# Patient Record
Sex: Female | Born: 1955 | Race: Black or African American | Hispanic: No | Smoking: Former smoker
Health system: Southern US, Community
[De-identification: ages and names within clinical notes are randomized; demographics above are authoritative.]

## PROBLEM LIST (undated history)

## (undated) DIAGNOSIS — N179 Acute kidney failure, unspecified: Secondary | ICD-10-CM

## (undated) DIAGNOSIS — I10 Essential (primary) hypertension: Secondary | ICD-10-CM

## (undated) DIAGNOSIS — R21 Rash and other nonspecific skin eruption: Secondary | ICD-10-CM

## (undated) DIAGNOSIS — M1990 Unspecified osteoarthritis, unspecified site: Secondary | ICD-10-CM

## (undated) DIAGNOSIS — N281 Cyst of kidney, acquired: Secondary | ICD-10-CM

## (undated) DIAGNOSIS — M21619 Bunion of unspecified foot: Secondary | ICD-10-CM

## (undated) DIAGNOSIS — I639 Cerebral infarction, unspecified: Secondary | ICD-10-CM

## (undated) DIAGNOSIS — K5790 Diverticulosis of intestine, part unspecified, without perforation or abscess without bleeding: Secondary | ICD-10-CM

## (undated) DIAGNOSIS — L819 Disorder of pigmentation, unspecified: Secondary | ICD-10-CM

## (undated) DIAGNOSIS — K219 Gastro-esophageal reflux disease without esophagitis: Secondary | ICD-10-CM

## (undated) DIAGNOSIS — M654 Radial styloid tenosynovitis [de Quervain]: Secondary | ICD-10-CM

## (undated) DIAGNOSIS — T849XXA Unspecified complication of internal orthopedic prosthetic device, implant and graft, initial encounter: Secondary | ICD-10-CM

## (undated) DIAGNOSIS — I491 Atrial premature depolarization: Secondary | ICD-10-CM

## (undated) DIAGNOSIS — M791 Myalgia, unspecified site: Secondary | ICD-10-CM

## (undated) DIAGNOSIS — R252 Cramp and spasm: Secondary | ICD-10-CM

## (undated) DIAGNOSIS — M7550 Bursitis of unspecified shoulder: Secondary | ICD-10-CM

## (undated) DIAGNOSIS — K5900 Constipation, unspecified: Secondary | ICD-10-CM

## (undated) DIAGNOSIS — D1720 Benign lipomatous neoplasm of skin and subcutaneous tissue of unspecified limb: Secondary | ICD-10-CM

## (undated) DIAGNOSIS — K5731 Diverticulosis of large intestine without perforation or abscess with bleeding: Secondary | ICD-10-CM

## (undated) DIAGNOSIS — L989 Disorder of the skin and subcutaneous tissue, unspecified: Secondary | ICD-10-CM

## (undated) DIAGNOSIS — K838 Other specified diseases of biliary tract: Secondary | ICD-10-CM

## (undated) DIAGNOSIS — Z8673 Personal history of transient ischemic attack (TIA), and cerebral infarction without residual deficits: Secondary | ICD-10-CM

## (undated) DIAGNOSIS — T7840XA Allergy, unspecified, initial encounter: Secondary | ICD-10-CM

## (undated) DIAGNOSIS — N823 Fistula of vagina to large intestine: Secondary | ICD-10-CM

## (undated) DIAGNOSIS — M542 Cervicalgia: Secondary | ICD-10-CM

## (undated) DIAGNOSIS — I809 Phlebitis and thrombophlebitis of unspecified site: Secondary | ICD-10-CM

## (undated) DIAGNOSIS — K567 Ileus, unspecified: Secondary | ICD-10-CM

## (undated) DIAGNOSIS — Z972 Presence of dental prosthetic device (complete) (partial): Secondary | ICD-10-CM

## (undated) DIAGNOSIS — E7800 Pure hypercholesterolemia, unspecified: Secondary | ICD-10-CM

## (undated) DIAGNOSIS — M75120 Complete rotator cuff tear or rupture of unspecified shoulder, not specified as traumatic: Secondary | ICD-10-CM

## (undated) DIAGNOSIS — K644 Residual hemorrhoidal skin tags: Secondary | ICD-10-CM

## (undated) DIAGNOSIS — M549 Dorsalgia, unspecified: Secondary | ICD-10-CM

## (undated) DIAGNOSIS — Z8719 Personal history of other diseases of the digestive system: Secondary | ICD-10-CM

## (undated) DIAGNOSIS — S81809A Unspecified open wound, unspecified lower leg, initial encounter: Secondary | ICD-10-CM

## (undated) DIAGNOSIS — M7540 Impingement syndrome of unspecified shoulder: Secondary | ICD-10-CM

## (undated) DIAGNOSIS — K289 Gastrojejunal ulcer, unspecified as acute or chronic, without hemorrhage or perforation: Secondary | ICD-10-CM

## (undated) DIAGNOSIS — G459 Transient cerebral ischemic attack, unspecified: Secondary | ICD-10-CM

## (undated) DIAGNOSIS — IMO0002 Reserved for concepts with insufficient information to code with codable children: Secondary | ICD-10-CM

## (undated) DIAGNOSIS — E1169 Type 2 diabetes mellitus with other specified complication: Secondary | ICD-10-CM

## (undated) DIAGNOSIS — M545 Low back pain: Secondary | ICD-10-CM

## (undated) DIAGNOSIS — M19011 Primary osteoarthritis, right shoulder: Secondary | ICD-10-CM

## (undated) DIAGNOSIS — R079 Chest pain, unspecified: Secondary | ICD-10-CM

## (undated) DIAGNOSIS — R202 Paresthesia of skin: Secondary | ICD-10-CM

## (undated) DIAGNOSIS — S83249A Other tear of medial meniscus, current injury, unspecified knee, initial encounter: Secondary | ICD-10-CM

## (undated) DIAGNOSIS — D649 Anemia, unspecified: Secondary | ICD-10-CM

## (undated) DIAGNOSIS — I83023 Varicose veins of left lower extremity with ulcer of ankle: Secondary | ICD-10-CM

## (undated) DIAGNOSIS — K56609 Unspecified intestinal obstruction, unspecified as to partial versus complete obstruction: Secondary | ICD-10-CM

## (undated) DIAGNOSIS — R1110 Vomiting, unspecified: Secondary | ICD-10-CM

## (undated) DIAGNOSIS — M19019 Primary osteoarthritis, unspecified shoulder: Secondary | ICD-10-CM

## (undated) DIAGNOSIS — Z5189 Encounter for other specified aftercare: Secondary | ICD-10-CM

## (undated) DIAGNOSIS — L03116 Cellulitis of left lower limb: Secondary | ICD-10-CM

## (undated) DIAGNOSIS — N939 Abnormal uterine and vaginal bleeding, unspecified: Secondary | ICD-10-CM

## (undated) DIAGNOSIS — K921 Melena: Secondary | ICD-10-CM

## (undated) DIAGNOSIS — K922 Gastrointestinal hemorrhage, unspecified: Secondary | ICD-10-CM

## (undated) DIAGNOSIS — K284 Chronic or unspecified gastrojejunal ulcer with hemorrhage: Secondary | ICD-10-CM

## (undated) DIAGNOSIS — R0683 Snoring: Secondary | ICD-10-CM

## (undated) DIAGNOSIS — K5730 Diverticulosis of large intestine without perforation or abscess without bleeding: Secondary | ICD-10-CM

## (undated) HISTORY — DX: Constipation, unspecified: K59.00

## (undated) HISTORY — DX: Other tear of medial meniscus, current injury, unspecified knee, initial encounter: S83.249A

## (undated) HISTORY — DX: Essential (primary) hypertension: I10

## (undated) HISTORY — PX: ABDOMINAL HYSTERECTOMY: SHX81

## (undated) HISTORY — DX: Bunion of unspecified foot: M21.619

## (undated) HISTORY — DX: Diverticulosis of large intestine without perforation or abscess with bleeding: K57.31

## (undated) HISTORY — DX: Allergy, unspecified, initial encounter: T78.40XA

## (undated) HISTORY — DX: Type 2 diabetes mellitus with other specified complication: E11.69

## (undated) HISTORY — DX: Melena: K92.1

## (undated) HISTORY — DX: Chronic or unspecified gastrojejunal ulcer with hemorrhage: K28.4

## (undated) HISTORY — DX: Cramp and spasm: R25.2

## (undated) HISTORY — DX: Reserved for concepts with insufficient information to code with codable children: IMO0002

## (undated) HISTORY — DX: Cellulitis of left lower limb: L03.116

## (undated) HISTORY — DX: Anemia, unspecified: D64.9

## (undated) HISTORY — DX: Diverticulosis of large intestine without perforation or abscess without bleeding: K57.30

## (undated) HISTORY — DX: Gastrointestinal hemorrhage, unspecified: K92.2

## (undated) HISTORY — DX: Myalgia, unspecified site: M79.10

## (undated) HISTORY — DX: Cerebral infarction, unspecified: I63.9

## (undated) HISTORY — DX: Vomiting, unspecified: R11.10

## (undated) HISTORY — DX: Dorsalgia, unspecified: M54.9

## (undated) HISTORY — DX: Radial styloid tenosynovitis (de quervain): M65.4

## (undated) HISTORY — DX: Acute kidney failure, unspecified: N17.9

## (undated) HISTORY — PX: TOTAL KNEE ARTHROPLASTY: SHX125

## (undated) HISTORY — DX: Chest pain, unspecified: R07.9

## (undated) HISTORY — PX: COLONOSCOPY: SHX174

---
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
## 1993-11-01 HISTORY — PX: METATARSAL OSTEOTOMY: SHX1641

## 1995-11-02 DIAGNOSIS — S83249A Other tear of medial meniscus, current injury, unspecified knee, initial encounter: Secondary | ICD-10-CM

## 1995-11-02 HISTORY — DX: Other tear of medial meniscus, current injury, unspecified knee, initial encounter: S83.249A

## 1998-10-10 ENCOUNTER — Encounter: Admission: RE | Admit: 1998-10-10 | Discharge: 1999-01-08 | Payer: Self-pay | Admitting: *Deleted

## 1998-12-23 ENCOUNTER — Encounter: Payer: Self-pay | Admitting: Orthopedic Surgery

## 1998-12-29 ENCOUNTER — Encounter: Payer: Self-pay | Admitting: Orthopedic Surgery

## 1998-12-29 ENCOUNTER — Inpatient Hospital Stay (HOSPITAL_COMMUNITY): Admission: RE | Admit: 1998-12-29 | Discharge: 1999-01-01 | Payer: Self-pay | Admitting: Orthopedic Surgery

## 1999-01-01 ENCOUNTER — Inpatient Hospital Stay (HOSPITAL_COMMUNITY)
Admission: RE | Admit: 1999-01-01 | Discharge: 1999-01-07 | Payer: Self-pay | Admitting: Physical Medicine and Rehabilitation

## 1999-02-27 ENCOUNTER — Ambulatory Visit (HOSPITAL_COMMUNITY): Admission: RE | Admit: 1999-02-27 | Discharge: 1999-02-27 | Payer: Self-pay | Admitting: Obstetrics and Gynecology

## 2000-05-01 ENCOUNTER — Inpatient Hospital Stay (HOSPITAL_COMMUNITY): Admission: EM | Admit: 2000-05-01 | Discharge: 2000-05-04 | Payer: Self-pay | Admitting: *Deleted

## 2000-05-04 ENCOUNTER — Inpatient Hospital Stay (HOSPITAL_COMMUNITY): Admission: EM | Admit: 2000-05-04 | Discharge: 2000-05-10 | Payer: Self-pay | Admitting: Emergency Medicine

## 2000-05-05 ENCOUNTER — Encounter: Payer: Self-pay | Admitting: Gastroenterology

## 2000-05-08 ENCOUNTER — Encounter: Payer: Self-pay | Admitting: Gastroenterology

## 2000-05-09 ENCOUNTER — Encounter: Payer: Self-pay | Admitting: Gastroenterology

## 2001-04-08 ENCOUNTER — Emergency Department (HOSPITAL_COMMUNITY): Admission: EM | Admit: 2001-04-08 | Discharge: 2001-04-08 | Payer: Self-pay | Admitting: Emergency Medicine

## 2001-04-25 ENCOUNTER — Emergency Department (HOSPITAL_COMMUNITY): Admission: EM | Admit: 2001-04-25 | Discharge: 2001-04-25 | Payer: Self-pay | Admitting: Emergency Medicine

## 2001-05-10 ENCOUNTER — Encounter: Payer: Self-pay | Admitting: Emergency Medicine

## 2001-05-10 ENCOUNTER — Emergency Department (HOSPITAL_COMMUNITY): Admission: EM | Admit: 2001-05-10 | Discharge: 2001-05-10 | Payer: Self-pay | Admitting: Emergency Medicine

## 2001-07-12 ENCOUNTER — Encounter: Payer: Self-pay | Admitting: Family Medicine

## 2001-07-12 ENCOUNTER — Encounter: Admission: RE | Admit: 2001-07-12 | Discharge: 2001-07-12 | Payer: Self-pay | Admitting: Family Medicine

## 2002-04-17 ENCOUNTER — Inpatient Hospital Stay (HOSPITAL_COMMUNITY): Admission: EM | Admit: 2002-04-17 | Discharge: 2002-04-19 | Payer: Self-pay | Admitting: Emergency Medicine

## 2002-12-06 ENCOUNTER — Ambulatory Visit (HOSPITAL_COMMUNITY): Admission: RE | Admit: 2002-12-06 | Discharge: 2002-12-06 | Payer: Self-pay | Admitting: Family Medicine

## 2004-01-31 ENCOUNTER — Encounter (INDEPENDENT_AMBULATORY_CARE_PROVIDER_SITE_OTHER): Payer: Self-pay | Admitting: *Deleted

## 2004-01-31 LAB — CONVERTED CEMR LAB

## 2004-07-09 ENCOUNTER — Emergency Department (HOSPITAL_COMMUNITY): Admission: EM | Admit: 2004-07-09 | Discharge: 2004-07-09 | Payer: Self-pay | Admitting: *Deleted

## 2004-07-17 ENCOUNTER — Ambulatory Visit: Payer: Self-pay

## 2004-07-22 ENCOUNTER — Encounter: Payer: Self-pay | Admitting: Cardiology

## 2004-07-22 ENCOUNTER — Ambulatory Visit: Admission: RE | Admit: 2004-07-22 | Discharge: 2004-07-22 | Payer: Self-pay | Admitting: Family Medicine

## 2004-07-27 ENCOUNTER — Ambulatory Visit: Payer: Self-pay | Admitting: Family Medicine

## 2004-07-30 ENCOUNTER — Encounter: Admission: RE | Admit: 2004-07-30 | Discharge: 2004-07-30 | Payer: Self-pay | Admitting: Sports Medicine

## 2004-08-05 ENCOUNTER — Encounter: Admission: RE | Admit: 2004-08-05 | Discharge: 2004-08-05 | Payer: Self-pay | Admitting: Sports Medicine

## 2004-09-02 ENCOUNTER — Ambulatory Visit: Payer: Self-pay | Admitting: Family Medicine

## 2004-09-14 ENCOUNTER — Ambulatory Visit: Payer: Self-pay | Admitting: Family Medicine

## 2004-10-01 ENCOUNTER — Ambulatory Visit: Payer: Self-pay | Admitting: Family Medicine

## 2004-10-14 ENCOUNTER — Ambulatory Visit: Payer: Self-pay | Admitting: Family Medicine

## 2004-11-01 DIAGNOSIS — I639 Cerebral infarction, unspecified: Secondary | ICD-10-CM

## 2004-11-01 HISTORY — DX: Cerebral infarction, unspecified: I63.9

## 2004-11-30 ENCOUNTER — Ambulatory Visit: Payer: Self-pay | Admitting: Sports Medicine

## 2004-12-30 ENCOUNTER — Emergency Department (HOSPITAL_COMMUNITY): Admission: EM | Admit: 2004-12-30 | Discharge: 2004-12-30 | Payer: Self-pay | Admitting: Emergency Medicine

## 2004-12-31 ENCOUNTER — Ambulatory Visit: Payer: Self-pay | Admitting: Family Medicine

## 2005-01-07 ENCOUNTER — Ambulatory Visit: Payer: Self-pay | Admitting: Family Medicine

## 2005-01-15 ENCOUNTER — Ambulatory Visit: Payer: Self-pay | Admitting: Family Medicine

## 2005-01-18 ENCOUNTER — Encounter: Admission: RE | Admit: 2005-01-18 | Discharge: 2005-01-18 | Payer: Self-pay | Admitting: Sports Medicine

## 2005-03-04 ENCOUNTER — Ambulatory Visit: Payer: Self-pay | Admitting: Family Medicine

## 2005-03-05 ENCOUNTER — Ambulatory Visit: Payer: Self-pay | Admitting: Family Medicine

## 2005-03-10 ENCOUNTER — Ambulatory Visit: Payer: Self-pay | Admitting: Family Medicine

## 2005-03-19 ENCOUNTER — Ambulatory Visit: Payer: Self-pay | Admitting: Family Medicine

## 2005-04-01 ENCOUNTER — Ambulatory Visit: Payer: Self-pay | Admitting: Family Medicine

## 2005-04-09 ENCOUNTER — Ambulatory Visit: Payer: Self-pay | Admitting: Family Medicine

## 2005-04-12 ENCOUNTER — Ambulatory Visit: Payer: Self-pay | Admitting: Family Medicine

## 2005-06-03 ENCOUNTER — Ambulatory Visit: Payer: Self-pay | Admitting: Family Medicine

## 2005-06-21 ENCOUNTER — Ambulatory Visit: Payer: Self-pay | Admitting: Sports Medicine

## 2005-08-04 ENCOUNTER — Emergency Department (HOSPITAL_COMMUNITY): Admission: EM | Admit: 2005-08-04 | Discharge: 2005-08-04 | Payer: Self-pay | Admitting: Emergency Medicine

## 2005-08-09 ENCOUNTER — Emergency Department (HOSPITAL_COMMUNITY): Admission: EM | Admit: 2005-08-09 | Discharge: 2005-08-09 | Payer: Self-pay | Admitting: Emergency Medicine

## 2005-08-11 ENCOUNTER — Ambulatory Visit (HOSPITAL_COMMUNITY): Admission: RE | Admit: 2005-08-11 | Discharge: 2005-08-11 | Payer: Self-pay | Admitting: Family Medicine

## 2005-08-11 ENCOUNTER — Encounter: Payer: Self-pay | Admitting: Cardiology

## 2005-08-11 ENCOUNTER — Ambulatory Visit: Payer: Self-pay | Admitting: Cardiology

## 2005-08-18 ENCOUNTER — Ambulatory Visit: Payer: Self-pay | Admitting: Family Medicine

## 2005-08-31 ENCOUNTER — Ambulatory Visit: Payer: Self-pay | Admitting: Family Medicine

## 2005-08-31 ENCOUNTER — Observation Stay (HOSPITAL_COMMUNITY): Admission: AD | Admit: 2005-08-31 | Discharge: 2005-09-01 | Payer: Self-pay | Admitting: Family Medicine

## 2005-09-03 ENCOUNTER — Ambulatory Visit: Payer: Self-pay | Admitting: Family Medicine

## 2005-09-03 ENCOUNTER — Ambulatory Visit (HOSPITAL_COMMUNITY): Admission: RE | Admit: 2005-09-03 | Discharge: 2005-09-03 | Payer: Self-pay | Admitting: Family Medicine

## 2005-09-06 ENCOUNTER — Ambulatory Visit: Payer: Self-pay | Admitting: Family Medicine

## 2005-09-10 ENCOUNTER — Ambulatory Visit: Payer: Self-pay | Admitting: Family Medicine

## 2005-09-18 ENCOUNTER — Emergency Department (HOSPITAL_COMMUNITY): Admission: EM | Admit: 2005-09-18 | Discharge: 2005-09-19 | Payer: Self-pay | Admitting: Emergency Medicine

## 2005-09-20 ENCOUNTER — Ambulatory Visit: Payer: Self-pay | Admitting: Sports Medicine

## 2005-10-18 ENCOUNTER — Ambulatory Visit: Payer: Self-pay | Admitting: Family Medicine

## 2005-11-22 ENCOUNTER — Ambulatory Visit: Payer: Self-pay | Admitting: Sports Medicine

## 2005-12-07 ENCOUNTER — Ambulatory Visit: Payer: Self-pay | Admitting: Sports Medicine

## 2006-01-10 ENCOUNTER — Ambulatory Visit: Payer: Self-pay | Admitting: Family Medicine

## 2006-02-28 ENCOUNTER — Ambulatory Visit: Payer: Self-pay | Admitting: Sports Medicine

## 2006-03-02 ENCOUNTER — Ambulatory Visit: Payer: Self-pay | Admitting: Family Medicine

## 2006-03-02 ENCOUNTER — Inpatient Hospital Stay (HOSPITAL_COMMUNITY): Admission: AD | Admit: 2006-03-02 | Discharge: 2006-03-05 | Payer: Self-pay | Admitting: Family Medicine

## 2006-03-07 ENCOUNTER — Ambulatory Visit: Payer: Self-pay | Admitting: Gastroenterology

## 2006-03-07 ENCOUNTER — Ambulatory Visit: Payer: Self-pay | Admitting: Sports Medicine

## 2006-03-16 ENCOUNTER — Ambulatory Visit: Payer: Self-pay | Admitting: Family Medicine

## 2006-05-26 ENCOUNTER — Ambulatory Visit: Payer: Self-pay | Admitting: Family Medicine

## 2006-06-01 ENCOUNTER — Encounter: Admission: RE | Admit: 2006-06-01 | Discharge: 2006-06-01 | Payer: Self-pay | Admitting: Sports Medicine

## 2006-08-15 ENCOUNTER — Ambulatory Visit: Payer: Self-pay | Admitting: Sports Medicine

## 2006-08-23 ENCOUNTER — Ambulatory Visit (HOSPITAL_COMMUNITY): Admission: RE | Admit: 2006-08-23 | Discharge: 2006-08-23 | Payer: Self-pay | Admitting: Sports Medicine

## 2006-08-23 ENCOUNTER — Encounter (INDEPENDENT_AMBULATORY_CARE_PROVIDER_SITE_OTHER): Payer: Self-pay | Admitting: Cardiology

## 2006-08-28 ENCOUNTER — Emergency Department (HOSPITAL_COMMUNITY): Admission: EM | Admit: 2006-08-28 | Discharge: 2006-08-28 | Payer: Self-pay | Admitting: Emergency Medicine

## 2006-08-31 ENCOUNTER — Ambulatory Visit: Payer: Self-pay | Admitting: Family Medicine

## 2006-09-07 ENCOUNTER — Ambulatory Visit: Payer: Self-pay | Admitting: Family Medicine

## 2006-11-18 ENCOUNTER — Ambulatory Visit: Payer: Self-pay | Admitting: Family Medicine

## 2006-12-15 ENCOUNTER — Ambulatory Visit: Payer: Self-pay | Admitting: Vascular Surgery

## 2006-12-15 ENCOUNTER — Ambulatory Visit (HOSPITAL_COMMUNITY): Admission: RE | Admit: 2006-12-15 | Discharge: 2006-12-15 | Payer: Self-pay | Admitting: Internal Medicine

## 2006-12-15 ENCOUNTER — Ambulatory Visit: Payer: Self-pay | Admitting: Sports Medicine

## 2006-12-16 ENCOUNTER — Ambulatory Visit: Payer: Self-pay | Admitting: Family Medicine

## 2006-12-20 ENCOUNTER — Ambulatory Visit: Payer: Self-pay | Admitting: Family Medicine

## 2006-12-23 ENCOUNTER — Encounter (INDEPENDENT_AMBULATORY_CARE_PROVIDER_SITE_OTHER): Payer: Self-pay | Admitting: Family Medicine

## 2006-12-23 ENCOUNTER — Ambulatory Visit: Payer: Self-pay | Admitting: Family Medicine

## 2006-12-23 LAB — CONVERTED CEMR LAB
BUN: 12 mg/dL (ref 6–23)
CO2: 27 meq/L (ref 19–32)
Calcium: 9.6 mg/dL (ref 8.4–10.5)
Chloride: 101 meq/L (ref 96–112)
Creatinine, Ser: 0.73 mg/dL (ref 0.40–1.20)
Glucose, Bld: 109 mg/dL — ABNORMAL HIGH (ref 70–99)
Potassium: 3.5 meq/L (ref 3.5–5.3)
Sodium: 142 meq/L (ref 135–145)

## 2006-12-29 DIAGNOSIS — Z862 Personal history of diseases of the blood and blood-forming organs and certain disorders involving the immune mechanism: Secondary | ICD-10-CM | POA: Insufficient documentation

## 2006-12-29 DIAGNOSIS — D509 Iron deficiency anemia, unspecified: Secondary | ICD-10-CM | POA: Insufficient documentation

## 2006-12-29 DIAGNOSIS — I872 Venous insufficiency (chronic) (peripheral): Secondary | ICD-10-CM | POA: Insufficient documentation

## 2006-12-29 DIAGNOSIS — E669 Obesity, unspecified: Secondary | ICD-10-CM

## 2006-12-29 HISTORY — DX: Morbid (severe) obesity due to excess calories: E66.01

## 2006-12-29 HISTORY — DX: Personal history of diseases of the blood and blood-forming organs and certain disorders involving the immune mechanism: Z86.2

## 2006-12-30 ENCOUNTER — Encounter (INDEPENDENT_AMBULATORY_CARE_PROVIDER_SITE_OTHER): Payer: Self-pay | Admitting: *Deleted

## 2006-12-30 ENCOUNTER — Ambulatory Visit: Payer: Self-pay | Admitting: Family Medicine

## 2006-12-30 ENCOUNTER — Ambulatory Visit (HOSPITAL_COMMUNITY): Admission: RE | Admit: 2006-12-30 | Discharge: 2006-12-30 | Payer: Self-pay | Admitting: Sports Medicine

## 2007-01-30 ENCOUNTER — Emergency Department (HOSPITAL_COMMUNITY): Admission: EM | Admit: 2007-01-30 | Discharge: 2007-01-30 | Payer: Self-pay | Admitting: Emergency Medicine

## 2007-02-16 ENCOUNTER — Telehealth: Payer: Self-pay | Admitting: *Deleted

## 2007-02-16 ENCOUNTER — Inpatient Hospital Stay (HOSPITAL_COMMUNITY): Admission: AD | Admit: 2007-02-16 | Discharge: 2007-02-21 | Payer: Self-pay | Admitting: Internal Medicine

## 2007-02-16 ENCOUNTER — Ambulatory Visit: Payer: Self-pay | Admitting: Internal Medicine

## 2007-02-24 ENCOUNTER — Ambulatory Visit: Payer: Self-pay | Admitting: Family Medicine

## 2007-02-24 ENCOUNTER — Ambulatory Visit: Payer: Self-pay | Admitting: Gastroenterology

## 2007-03-01 ENCOUNTER — Ambulatory Visit: Payer: Self-pay | Admitting: Family Medicine

## 2007-03-22 ENCOUNTER — Ambulatory Visit: Payer: Self-pay | Admitting: Gastroenterology

## 2007-03-22 ENCOUNTER — Ambulatory Visit: Payer: Self-pay | Admitting: Family Medicine

## 2007-05-12 ENCOUNTER — Ambulatory Visit: Payer: Self-pay | Admitting: Family Medicine

## 2007-05-12 DIAGNOSIS — I10 Essential (primary) hypertension: Secondary | ICD-10-CM | POA: Insufficient documentation

## 2007-05-18 ENCOUNTER — Encounter: Payer: Self-pay | Admitting: *Deleted

## 2007-06-09 ENCOUNTER — Telehealth: Payer: Self-pay | Admitting: *Deleted

## 2007-06-21 ENCOUNTER — Encounter: Admission: RE | Admit: 2007-06-21 | Discharge: 2007-06-21 | Payer: Self-pay | Admitting: Family Medicine

## 2007-06-23 ENCOUNTER — Encounter (INDEPENDENT_AMBULATORY_CARE_PROVIDER_SITE_OTHER): Payer: Self-pay | Admitting: Family Medicine

## 2007-07-05 ENCOUNTER — Encounter (INDEPENDENT_AMBULATORY_CARE_PROVIDER_SITE_OTHER): Payer: Self-pay | Admitting: Family Medicine

## 2007-07-05 ENCOUNTER — Ambulatory Visit: Payer: Self-pay | Admitting: Family Medicine

## 2007-07-05 LAB — CONVERTED CEMR LAB
BUN: 17 mg/dL (ref 6–23)
CO2: 24 meq/L (ref 19–32)
Calcium: 9.5 mg/dL (ref 8.4–10.5)
Chloride: 104 meq/L (ref 96–112)
Creatinine, Ser: 0.86 mg/dL (ref 0.40–1.20)
Glucose, Bld: 85 mg/dL (ref 70–99)
HCT: 39.9 % (ref 36.0–46.0)
Hemoglobin: 13.1 g/dL (ref 12.0–15.0)
MCHC: 32.8 g/dL (ref 30.0–36.0)
MCV: 84.2 fL (ref 78.0–100.0)
Platelets: 307 10*3/uL (ref 150–400)
Potassium: 3.8 meq/L (ref 3.5–5.3)
RBC: 4.74 M/uL (ref 3.87–5.11)
RDW: 16.2 % — ABNORMAL HIGH (ref 11.5–14.0)
Sodium: 142 meq/L (ref 135–145)
WBC: 5.9 10*3/uL (ref 4.0–10.5)

## 2007-07-26 ENCOUNTER — Ambulatory Visit: Payer: Self-pay | Admitting: Family Medicine

## 2007-07-26 ENCOUNTER — Ambulatory Visit (HOSPITAL_COMMUNITY): Admission: RE | Admit: 2007-07-26 | Discharge: 2007-07-26 | Payer: Self-pay | Admitting: Family Medicine

## 2007-07-26 ENCOUNTER — Encounter (INDEPENDENT_AMBULATORY_CARE_PROVIDER_SITE_OTHER): Payer: Self-pay | Admitting: Family Medicine

## 2007-07-26 DIAGNOSIS — Z8679 Personal history of other diseases of the circulatory system: Secondary | ICD-10-CM

## 2007-07-26 DIAGNOSIS — Z8719 Personal history of other diseases of the digestive system: Secondary | ICD-10-CM | POA: Insufficient documentation

## 2007-07-28 ENCOUNTER — Encounter (INDEPENDENT_AMBULATORY_CARE_PROVIDER_SITE_OTHER): Payer: Self-pay | Admitting: Family Medicine

## 2007-08-12 ENCOUNTER — Encounter (INDEPENDENT_AMBULATORY_CARE_PROVIDER_SITE_OTHER): Payer: Self-pay | Admitting: Family Medicine

## 2007-08-15 ENCOUNTER — Encounter (INDEPENDENT_AMBULATORY_CARE_PROVIDER_SITE_OTHER): Payer: Self-pay | Admitting: Family Medicine

## 2007-08-15 ENCOUNTER — Ambulatory Visit: Payer: Self-pay | Admitting: Family Medicine

## 2007-08-15 DIAGNOSIS — N281 Cyst of kidney, acquired: Secondary | ICD-10-CM

## 2007-08-15 HISTORY — DX: Cyst of kidney, acquired: N28.1

## 2007-08-18 ENCOUNTER — Encounter (INDEPENDENT_AMBULATORY_CARE_PROVIDER_SITE_OTHER): Payer: Self-pay | Admitting: Family Medicine

## 2007-08-18 ENCOUNTER — Telehealth (INDEPENDENT_AMBULATORY_CARE_PROVIDER_SITE_OTHER): Payer: Self-pay | Admitting: *Deleted

## 2007-08-21 ENCOUNTER — Telehealth: Payer: Self-pay | Admitting: *Deleted

## 2007-08-21 ENCOUNTER — Encounter (HOSPITAL_COMMUNITY): Admission: RE | Admit: 2007-08-21 | Discharge: 2007-08-22 | Payer: Self-pay | Admitting: Cardiovascular Disease

## 2007-08-24 ENCOUNTER — Telehealth (INDEPENDENT_AMBULATORY_CARE_PROVIDER_SITE_OTHER): Payer: Self-pay | Admitting: *Deleted

## 2007-08-24 ENCOUNTER — Ambulatory Visit (HOSPITAL_COMMUNITY): Admission: RE | Admit: 2007-08-24 | Discharge: 2007-08-24 | Payer: Self-pay | Admitting: Sports Medicine

## 2007-08-25 ENCOUNTER — Encounter (INDEPENDENT_AMBULATORY_CARE_PROVIDER_SITE_OTHER): Payer: Self-pay | Admitting: Family Medicine

## 2007-08-28 ENCOUNTER — Telehealth (INDEPENDENT_AMBULATORY_CARE_PROVIDER_SITE_OTHER): Payer: Self-pay | Admitting: Family Medicine

## 2007-09-18 ENCOUNTER — Ambulatory Visit: Payer: Self-pay | Admitting: Family Medicine

## 2007-09-21 ENCOUNTER — Ambulatory Visit: Payer: Self-pay | Admitting: Family Medicine

## 2007-09-21 ENCOUNTER — Encounter: Payer: Self-pay | Admitting: Family Medicine

## 2007-10-03 ENCOUNTER — Encounter (INDEPENDENT_AMBULATORY_CARE_PROVIDER_SITE_OTHER): Payer: Self-pay | Admitting: Family Medicine

## 2007-10-13 ENCOUNTER — Encounter (INDEPENDENT_AMBULATORY_CARE_PROVIDER_SITE_OTHER): Payer: Self-pay | Admitting: Family Medicine

## 2007-10-13 ENCOUNTER — Ambulatory Visit: Payer: Self-pay | Admitting: Family Medicine

## 2007-10-23 ENCOUNTER — Ambulatory Visit: Payer: Self-pay | Admitting: Family Medicine

## 2008-01-15 ENCOUNTER — Telehealth: Payer: Self-pay | Admitting: *Deleted

## 2008-01-15 ENCOUNTER — Telehealth (INDEPENDENT_AMBULATORY_CARE_PROVIDER_SITE_OTHER): Payer: Self-pay | Admitting: Family Medicine

## 2008-01-16 ENCOUNTER — Inpatient Hospital Stay (HOSPITAL_COMMUNITY): Admission: EM | Admit: 2008-01-16 | Discharge: 2008-01-23 | Payer: Self-pay | Admitting: Emergency Medicine

## 2008-01-16 ENCOUNTER — Ambulatory Visit: Payer: Self-pay | Admitting: Family Medicine

## 2008-01-16 ENCOUNTER — Encounter: Payer: Self-pay | Admitting: Family Medicine

## 2008-01-18 ENCOUNTER — Ambulatory Visit: Payer: Self-pay | Admitting: Gastroenterology

## 2008-01-20 ENCOUNTER — Encounter: Payer: Self-pay | Admitting: Gastroenterology

## 2008-01-20 ENCOUNTER — Encounter: Payer: Self-pay | Admitting: Family Medicine

## 2008-01-24 ENCOUNTER — Encounter: Payer: Self-pay | Admitting: Family Medicine

## 2008-01-24 ENCOUNTER — Telehealth: Payer: Self-pay | Admitting: *Deleted

## 2008-01-25 ENCOUNTER — Ambulatory Visit: Payer: Self-pay | Admitting: Family Medicine

## 2008-01-25 ENCOUNTER — Telehealth: Payer: Self-pay | Admitting: Family Medicine

## 2008-01-25 DIAGNOSIS — Z8639 Personal history of other endocrine, nutritional and metabolic disease: Secondary | ICD-10-CM | POA: Insufficient documentation

## 2008-01-30 ENCOUNTER — Encounter: Admission: RE | Admit: 2008-01-30 | Discharge: 2008-01-30 | Payer: Self-pay | Admitting: Cardiovascular Disease

## 2008-02-05 ENCOUNTER — Encounter: Payer: Self-pay | Admitting: Family Medicine

## 2008-02-13 ENCOUNTER — Encounter: Payer: Self-pay | Admitting: Family Medicine

## 2008-02-13 ENCOUNTER — Ambulatory Visit: Payer: Self-pay | Admitting: Family Medicine

## 2008-02-13 LAB — CONVERTED CEMR LAB
Cholesterol: 169 mg/dL (ref 0–200)
Glucose, Bld: 81 mg/dL (ref 70–99)
HDL: 64 mg/dL (ref >39)
Hgb A1c MFr Bld: 5.7 %
LDL Cholesterol: 93 mg/dL (ref 0–99)
Total CHOL/HDL Ratio: 2.6
Triglycerides: 61 mg/dL (ref <150)
VLDL: 12 mg/dL (ref 0–40)

## 2008-02-15 ENCOUNTER — Encounter: Payer: Self-pay | Admitting: Family Medicine

## 2008-03-29 ENCOUNTER — Encounter: Payer: Self-pay | Admitting: Family Medicine

## 2008-03-29 ENCOUNTER — Ambulatory Visit: Payer: Self-pay | Admitting: Family Medicine

## 2008-03-29 LAB — CONVERTED CEMR LAB
Basophils Absolute: 0 10*3/uL (ref 0.0–0.1)
Basophils Relative: 0 % (ref 0–1)
Eosinophils Absolute: 0.3 10*3/uL (ref 0.0–0.7)
Eosinophils Relative: 5 % (ref 0–5)
HCT: 37.6 % (ref 36.0–46.0)
Hemoglobin: 12.2 g/dL (ref 12.0–15.0)
Hgb A1c MFr Bld: 5.9 %
Lymphocytes Relative: 26 % (ref 12–46)
Lymphs Abs: 1.8 10*3/uL (ref 0.7–4.0)
MCHC: 32.4 g/dL (ref 30.0–36.0)
MCV: 88.5 fL (ref 78.0–100.0)
Monocytes Absolute: 0.5 10*3/uL (ref 0.1–1.0)
Monocytes Relative: 7 % (ref 3–12)
Neutro Abs: 4.4 10*3/uL (ref 1.7–7.7)
Neutrophils Relative %: 63 % (ref 43–77)
Platelets: 304 10*3/uL (ref 150–400)
RBC: 4.25 M/uL (ref 3.87–5.11)
RDW: 14.5 % (ref 11.5–15.5)
TSH: 0.86 microintl units/mL (ref 0.350–5.50)
WBC: 7 10*3/uL (ref 4.0–10.5)

## 2008-04-09 ENCOUNTER — Telehealth: Payer: Self-pay | Admitting: *Deleted

## 2008-04-16 ENCOUNTER — Encounter: Payer: Self-pay | Admitting: Family Medicine

## 2008-04-24 ENCOUNTER — Telehealth: Payer: Self-pay | Admitting: *Deleted

## 2008-04-25 ENCOUNTER — Ambulatory Visit: Payer: Self-pay | Admitting: Family Medicine

## 2008-04-25 LAB — CONVERTED CEMR LAB: Rapid Strep: POSITIVE

## 2008-04-29 ENCOUNTER — Telehealth: Payer: Self-pay | Admitting: *Deleted

## 2008-05-01 ENCOUNTER — Encounter: Payer: Self-pay | Admitting: *Deleted

## 2008-05-31 ENCOUNTER — Encounter: Payer: Self-pay | Admitting: Family Medicine

## 2008-05-31 ENCOUNTER — Ambulatory Visit: Payer: Self-pay | Admitting: Family Medicine

## 2008-05-31 LAB — CONVERTED CEMR LAB
ALT: 12 units/L (ref 0–35)
AST: 13 units/L (ref 0–37)
Albumin: 4 g/dL (ref 3.5–5.2)
Alkaline Phosphatase: 74 units/L (ref 39–117)
BUN: 12 mg/dL (ref 6–23)
CO2: 25 meq/L (ref 19–32)
Calcium: 9.5 mg/dL (ref 8.4–10.5)
Chloride: 102 meq/L (ref 96–112)
Creatinine, Ser: 0.77 mg/dL (ref 0.40–1.20)
Glucose, Bld: 85 mg/dL (ref 70–99)
HCT: 38.1 % (ref 36.0–46.0)
Hemoglobin: 12.2 g/dL (ref 12.0–15.0)
MCHC: 32 g/dL (ref 30.0–36.0)
MCV: 87 fL (ref 78.0–100.0)
Platelets: 274 10*3/uL (ref 150–400)
Potassium: 3.6 meq/L (ref 3.5–5.3)
RBC: 4.38 M/uL (ref 3.87–5.11)
RDW: 16.6 % — ABNORMAL HIGH (ref 11.5–15.5)
Sodium: 144 meq/L (ref 135–145)
Total Bilirubin: 0.3 mg/dL (ref 0.3–1.2)
Total Protein: 7.1 g/dL (ref 6.0–8.3)
WBC: 6.5 10*3/uL (ref 4.0–10.5)

## 2008-06-03 ENCOUNTER — Encounter: Payer: Self-pay | Admitting: Family Medicine

## 2008-06-03 LAB — CONVERTED CEMR LAB
FSH: 24.9 milliintl units/mL
LH: 21.2 milliintl units/mL

## 2008-06-20 ENCOUNTER — Ambulatory Visit: Payer: Self-pay | Admitting: Family Medicine

## 2008-06-20 ENCOUNTER — Encounter: Payer: Self-pay | Admitting: Family Medicine

## 2008-06-20 LAB — CONVERTED CEMR LAB
ALT: 15 units/L (ref 0–35)
AST: 15 units/L (ref 0–37)
Albumin: 4 g/dL (ref 3.5–5.2)
Alkaline Phosphatase: 75 units/L (ref 39–117)
BUN: 13 mg/dL (ref 6–23)
CO2: 26 meq/L (ref 19–32)
Calcium: 9.6 mg/dL (ref 8.4–10.5)
Chloride: 102 meq/L (ref 96–112)
Creatinine, Ser: 0.75 mg/dL (ref 0.40–1.20)
Glucose, Bld: 116 mg/dL — ABNORMAL HIGH (ref 70–99)
HCT: 36.9 % (ref 36.0–46.0)
Hemoglobin: 12 g/dL (ref 12.0–15.0)
MCHC: 32.5 g/dL (ref 30.0–36.0)
MCV: 87.2 fL (ref 78.0–100.0)
Platelets: 271 10*3/uL (ref 150–400)
Potassium: 3.7 meq/L (ref 3.5–5.3)
RBC: 4.23 M/uL (ref 3.87–5.11)
RDW: 17.5 % — ABNORMAL HIGH (ref 11.5–15.5)
Sodium: 142 meq/L (ref 135–145)
TSH: 1.126 microintl units/mL (ref 0.350–4.50)
Total Bilirubin: 0.4 mg/dL (ref 0.3–1.2)
Total Protein: 7.2 g/dL (ref 6.0–8.3)
WBC: 6 10*3/uL (ref 4.0–10.5)

## 2008-06-21 ENCOUNTER — Encounter: Admission: RE | Admit: 2008-06-21 | Discharge: 2008-06-21 | Payer: Self-pay | Admitting: Family Medicine

## 2008-06-25 ENCOUNTER — Encounter: Payer: Self-pay | Admitting: Family Medicine

## 2008-07-10 ENCOUNTER — Ambulatory Visit: Payer: Self-pay | Admitting: Family Medicine

## 2008-07-10 LAB — CONVERTED CEMR LAB: Hgb A1c MFr Bld: 6.1 %

## 2008-07-23 ENCOUNTER — Telehealth: Payer: Self-pay | Admitting: *Deleted

## 2008-07-24 ENCOUNTER — Ambulatory Visit: Payer: Self-pay | Admitting: Family Medicine

## 2008-07-24 LAB — CONVERTED CEMR LAB: Rapid Strep: POSITIVE

## 2008-07-29 ENCOUNTER — Telehealth: Payer: Self-pay | Admitting: *Deleted

## 2008-08-02 ENCOUNTER — Ambulatory Visit: Payer: Self-pay | Admitting: Family Medicine

## 2008-08-02 ENCOUNTER — Encounter: Payer: Self-pay | Admitting: Family Medicine

## 2008-08-28 ENCOUNTER — Encounter: Payer: Self-pay | Admitting: Family Medicine

## 2008-08-28 ENCOUNTER — Ambulatory Visit: Payer: Self-pay | Admitting: Family Medicine

## 2008-08-29 ENCOUNTER — Telehealth: Payer: Self-pay | Admitting: *Deleted

## 2008-09-12 ENCOUNTER — Telehealth: Payer: Self-pay | Admitting: Family Medicine

## 2008-09-15 ENCOUNTER — Ambulatory Visit: Payer: Self-pay | Admitting: Family Medicine

## 2008-09-15 ENCOUNTER — Inpatient Hospital Stay (HOSPITAL_COMMUNITY): Admission: EM | Admit: 2008-09-15 | Discharge: 2008-09-18 | Payer: Self-pay | Admitting: Emergency Medicine

## 2008-09-15 ENCOUNTER — Encounter: Payer: Self-pay | Admitting: Family Medicine

## 2008-09-15 ENCOUNTER — Telehealth (INDEPENDENT_AMBULATORY_CARE_PROVIDER_SITE_OTHER): Payer: Self-pay | Admitting: Family Medicine

## 2008-09-16 ENCOUNTER — Ambulatory Visit: Payer: Self-pay | Admitting: Gastroenterology

## 2008-09-20 ENCOUNTER — Observation Stay (HOSPITAL_COMMUNITY): Admission: AD | Admit: 2008-09-20 | Discharge: 2008-09-21 | Payer: Self-pay | Admitting: Family Medicine

## 2008-09-20 ENCOUNTER — Encounter: Payer: Self-pay | Admitting: Family Medicine

## 2008-09-20 ENCOUNTER — Ambulatory Visit: Payer: Self-pay | Admitting: Family Medicine

## 2008-09-20 LAB — CONVERTED CEMR LAB
HCT: 23.5 % — ABNORMAL LOW (ref 36.0–46.0)
Hemoglobin: 7.4 g/dL
Hemoglobin: 7.7 g/dL — CL (ref 12.0–15.0)
MCHC: 32.8 g/dL (ref 30.0–36.0)
MCV: 92.2 fL (ref 78.0–100.0)
Platelets: 292 10*3/uL (ref 150–400)
RBC: 2.55 M/uL — ABNORMAL LOW (ref 3.87–5.11)
RDW: 14.7 % (ref 11.5–15.5)
WBC: 7.6 10*3/uL (ref 4.0–10.5)

## 2008-09-23 ENCOUNTER — Telehealth: Payer: Self-pay | Admitting: *Deleted

## 2008-09-23 ENCOUNTER — Ambulatory Visit: Payer: Self-pay | Admitting: Family Medicine

## 2008-09-23 LAB — CONVERTED CEMR LAB: Hemoglobin: 9.3 g/dL

## 2008-10-02 ENCOUNTER — Ambulatory Visit: Payer: Self-pay | Admitting: Family Medicine

## 2008-10-08 ENCOUNTER — Telehealth: Payer: Self-pay | Admitting: *Deleted

## 2008-10-09 ENCOUNTER — Encounter (INDEPENDENT_AMBULATORY_CARE_PROVIDER_SITE_OTHER): Payer: Self-pay | Admitting: Family Medicine

## 2008-10-09 ENCOUNTER — Ambulatory Visit: Payer: Self-pay | Admitting: Family Medicine

## 2008-10-09 LAB — CONVERTED CEMR LAB: Hemoglobin: 8.6 g/dL

## 2008-10-10 LAB — CONVERTED CEMR LAB
HCT: 27.8 % — ABNORMAL LOW (ref 36.0–46.0)
Hemoglobin: 8.6 g/dL — ABNORMAL LOW (ref 12.0–15.0)
MCHC: 30.9 g/dL (ref 30.0–36.0)
MCV: 90.6 fL (ref 78.0–100.0)
Platelets: 293 10*3/uL (ref 150–400)
RBC: 3.07 M/uL — ABNORMAL LOW (ref 3.87–5.11)
RDW: 17.9 % — ABNORMAL HIGH (ref 11.5–15.5)
WBC: 8.4 10*3/uL (ref 4.0–10.5)

## 2008-10-11 ENCOUNTER — Encounter: Payer: Self-pay | Admitting: Family Medicine

## 2008-10-14 ENCOUNTER — Telehealth: Payer: Self-pay | Admitting: *Deleted

## 2008-10-15 ENCOUNTER — Ambulatory Visit: Payer: Self-pay | Admitting: Family Medicine

## 2008-10-15 ENCOUNTER — Encounter (INDEPENDENT_AMBULATORY_CARE_PROVIDER_SITE_OTHER): Payer: Self-pay | Admitting: Family Medicine

## 2008-10-15 LAB — CONVERTED CEMR LAB
Folate: >20 ng/mL
HCT: 26.7 % — ABNORMAL LOW (ref 36.0–46.0)
Hemoglobin: 8.1 g/dL
Hemoglobin: 8 g/dL — ABNORMAL LOW (ref 12.0–15.0)
MCHC: 30 g/dL (ref 30.0–36.0)
MCV: 93 fL (ref 78.0–100.0)
Platelets: 371 10*3/uL (ref 150–400)
RBC: 2.87 M/uL — ABNORMAL LOW (ref 3.87–5.11)
RDW: 18.3 % — ABNORMAL HIGH (ref 11.5–15.5)
Vitamin B-12: >2000 pg/mL — ABNORMAL HIGH (ref 211–911)
WBC: 7.6 10*3/uL (ref 4.0–10.5)

## 2008-10-17 ENCOUNTER — Ambulatory Visit: Payer: Self-pay | Admitting: Family Medicine

## 2008-11-05 ENCOUNTER — Ambulatory Visit: Payer: Self-pay | Admitting: Family Medicine

## 2009-01-08 ENCOUNTER — Telehealth (INDEPENDENT_AMBULATORY_CARE_PROVIDER_SITE_OTHER): Payer: Self-pay | Admitting: *Deleted

## 2009-02-14 ENCOUNTER — Ambulatory Visit: Payer: Self-pay | Admitting: Family Medicine

## 2009-03-11 ENCOUNTER — Telehealth: Payer: Self-pay | Admitting: Family Medicine

## 2009-03-19 ENCOUNTER — Encounter: Payer: Self-pay | Admitting: Family Medicine

## 2009-03-19 ENCOUNTER — Ambulatory Visit: Payer: Self-pay | Admitting: Family Medicine

## 2009-03-19 LAB — CONVERTED CEMR LAB
BUN: 11 mg/dL (ref 6–23)
CO2: 27 meq/L (ref 19–32)
Calcium: 10 mg/dL (ref 8.4–10.5)
Chloride: 104 meq/L (ref 96–112)
Creatinine, Ser: 0.74 mg/dL (ref 0.40–1.20)
Glucose, Bld: 95 mg/dL (ref 70–99)
Hgb A1c MFr Bld: 6.5 %
Potassium: 3.6 meq/L (ref 3.5–5.3)
Sodium: 142 meq/L (ref 135–145)

## 2009-03-25 ENCOUNTER — Ambulatory Visit (HOSPITAL_COMMUNITY): Admission: RE | Admit: 2009-03-25 | Discharge: 2009-03-25 | Payer: Self-pay | Admitting: Family Medicine

## 2009-03-25 ENCOUNTER — Encounter: Payer: Self-pay | Admitting: *Deleted

## 2009-04-03 ENCOUNTER — Encounter: Payer: Self-pay | Admitting: Family Medicine

## 2009-04-16 ENCOUNTER — Ambulatory Visit: Payer: Self-pay | Admitting: Family Medicine

## 2009-04-16 ENCOUNTER — Encounter: Payer: Self-pay | Admitting: Family Medicine

## 2009-04-16 LAB — CONVERTED CEMR LAB
ALT: 13 units/L (ref 0–35)
AST: 14 units/L (ref 0–37)
Albumin: 3.8 g/dL (ref 3.5–5.2)
Alkaline Phosphatase: 79 units/L (ref 39–117)
BUN: 14 mg/dL (ref 6–23)
CO2: 27 meq/L (ref 19–32)
Calcium: 9.5 mg/dL (ref 8.4–10.5)
Chloride: 105 meq/L (ref 96–112)
Creatinine, Ser: 0.73 mg/dL (ref 0.40–1.20)
Glucose, Bld: 91 mg/dL (ref 70–99)
Potassium: 3.5 meq/L (ref 3.5–5.3)
Sodium: 143 meq/L (ref 135–145)
Total Bilirubin: 0.2 mg/dL — ABNORMAL LOW (ref 0.3–1.2)
Total Protein: 6.7 g/dL (ref 6.0–8.3)

## 2009-04-21 ENCOUNTER — Telehealth: Payer: Self-pay | Admitting: Family Medicine

## 2009-05-22 ENCOUNTER — Telehealth: Payer: Self-pay | Admitting: Family Medicine

## 2009-06-06 ENCOUNTER — Telehealth: Payer: Self-pay | Admitting: *Deleted

## 2009-06-06 ENCOUNTER — Ambulatory Visit: Payer: Self-pay | Admitting: Family Medicine

## 2009-06-06 ENCOUNTER — Ambulatory Visit (HOSPITAL_COMMUNITY): Admission: RE | Admit: 2009-06-06 | Discharge: 2009-06-06 | Payer: Self-pay | Admitting: Family Medicine

## 2009-06-06 LAB — CONVERTED CEMR LAB: Hemoglobin: 13 g/dL

## 2009-06-10 ENCOUNTER — Encounter: Payer: Self-pay | Admitting: Family Medicine

## 2009-06-13 ENCOUNTER — Ambulatory Visit: Payer: Self-pay | Admitting: Family Medicine

## 2009-06-13 ENCOUNTER — Encounter: Payer: Self-pay | Admitting: Family Medicine

## 2009-06-16 LAB — CONVERTED CEMR LAB: Direct LDL: 86 mg/dL

## 2009-06-26 ENCOUNTER — Ambulatory Visit: Payer: Self-pay | Admitting: Family Medicine

## 2009-07-22 ENCOUNTER — Encounter: Payer: Self-pay | Admitting: Family Medicine

## 2009-07-24 ENCOUNTER — Ambulatory Visit: Payer: Self-pay | Admitting: Family Medicine

## 2009-07-24 LAB — CONVERTED CEMR LAB: Hgb A1c MFr Bld: 6.6 %

## 2009-08-12 ENCOUNTER — Encounter: Payer: Self-pay | Admitting: Family Medicine

## 2009-08-12 ENCOUNTER — Ambulatory Visit: Payer: Self-pay | Admitting: Family Medicine

## 2009-08-12 LAB — CONVERTED CEMR LAB
BUN: 8 mg/dL (ref 6–23)
CO2: 24 meq/L (ref 19–32)
Calcium: 9.3 mg/dL (ref 8.4–10.5)
Chloride: 108 meq/L (ref 96–112)
Creatinine, Ser: 0.62 mg/dL (ref 0.40–1.20)
Glucose, Bld: 103 mg/dL — ABNORMAL HIGH (ref 70–99)
Potassium: 4.1 meq/L (ref 3.5–5.3)
Sodium: 143 meq/L (ref 135–145)

## 2009-08-18 ENCOUNTER — Encounter: Payer: Self-pay | Admitting: Family Medicine

## 2009-08-18 ENCOUNTER — Ambulatory Visit: Payer: Self-pay | Admitting: Family Medicine

## 2009-08-18 ENCOUNTER — Telehealth: Payer: Self-pay | Admitting: Family Medicine

## 2009-08-18 LAB — CONVERTED CEMR LAB
BUN: 14 mg/dL (ref 6–23)
CO2: 24 meq/L (ref 19–32)
Calcium: 10 mg/dL (ref 8.4–10.5)
Chloride: 106 meq/L (ref 96–112)
Creatinine, Ser: 0.71 mg/dL (ref 0.40–1.20)
Glucose, Bld: 108 mg/dL — ABNORMAL HIGH (ref 70–99)
Potassium: 4.2 meq/L (ref 3.5–5.3)
Sodium: 144 meq/L (ref 135–145)

## 2009-08-19 ENCOUNTER — Encounter: Payer: Self-pay | Admitting: Family Medicine

## 2009-09-15 ENCOUNTER — Telehealth: Payer: Self-pay | Admitting: Family Medicine

## 2009-09-15 ENCOUNTER — Ambulatory Visit: Payer: Self-pay | Admitting: Family Medicine

## 2009-09-23 ENCOUNTER — Telehealth (INDEPENDENT_AMBULATORY_CARE_PROVIDER_SITE_OTHER): Payer: Self-pay | Admitting: *Deleted

## 2009-09-23 ENCOUNTER — Encounter: Admission: RE | Admit: 2009-09-23 | Discharge: 2009-09-23 | Payer: Self-pay | Admitting: Family Medicine

## 2009-10-02 ENCOUNTER — Emergency Department (HOSPITAL_COMMUNITY): Admission: EM | Admit: 2009-10-02 | Discharge: 2009-10-02 | Payer: Self-pay | Admitting: Emergency Medicine

## 2009-10-02 ENCOUNTER — Encounter: Payer: Self-pay | Admitting: Family Medicine

## 2009-10-13 ENCOUNTER — Encounter: Payer: Self-pay | Admitting: Family Medicine

## 2009-10-13 ENCOUNTER — Ambulatory Visit: Payer: Self-pay | Admitting: Family Medicine

## 2009-10-13 DIAGNOSIS — R0789 Other chest pain: Secondary | ICD-10-CM | POA: Insufficient documentation

## 2009-10-16 ENCOUNTER — Ambulatory Visit: Payer: Self-pay | Admitting: Family Medicine

## 2009-10-16 DIAGNOSIS — R6882 Decreased libido: Secondary | ICD-10-CM | POA: Insufficient documentation

## 2009-10-16 LAB — CONVERTED CEMR LAB: Hgb A1c MFr Bld: 6 %

## 2009-10-30 ENCOUNTER — Encounter: Payer: Self-pay | Admitting: Family Medicine

## 2009-10-30 ENCOUNTER — Telehealth: Payer: Self-pay | Admitting: *Deleted

## 2009-11-05 ENCOUNTER — Encounter (HOSPITAL_COMMUNITY): Admission: RE | Admit: 2009-11-05 | Discharge: 2010-01-05 | Payer: Self-pay | Admitting: Cardiovascular Disease

## 2009-11-14 ENCOUNTER — Ambulatory Visit: Payer: Self-pay | Admitting: Family Medicine

## 2009-12-09 ENCOUNTER — Encounter: Payer: Self-pay | Admitting: Family Medicine

## 2010-01-06 ENCOUNTER — Encounter: Payer: Self-pay | Admitting: Family Medicine

## 2010-01-14 ENCOUNTER — Telehealth: Payer: Self-pay | Admitting: Family Medicine

## 2010-01-14 ENCOUNTER — Ambulatory Visit: Payer: Self-pay | Admitting: Family Medicine

## 2010-01-14 LAB — CONVERTED CEMR LAB: Hgb A1c MFr Bld: 6 %

## 2010-01-19 ENCOUNTER — Encounter: Payer: Self-pay | Admitting: Family Medicine

## 2010-01-19 ENCOUNTER — Ambulatory Visit: Payer: Self-pay | Admitting: Family Medicine

## 2010-01-19 LAB — CONVERTED CEMR LAB
ALT: 12 units/L (ref 0–35)
AST: 11 units/L (ref 0–37)
Albumin: 4.1 g/dL (ref 3.5–5.2)
Alkaline Phosphatase: 70 units/L (ref 39–117)
BUN: 16 mg/dL (ref 6–23)
CO2: 23 meq/L (ref 19–32)
Calcium: 9.6 mg/dL (ref 8.4–10.5)
Chloride: 104 meq/L (ref 96–112)
Cholesterol: 149 mg/dL (ref 0–200)
Creatinine, Ser: 0.77 mg/dL (ref 0.40–1.20)
Glucose, Bld: 90 mg/dL (ref 70–99)
HCT: 36.8 % (ref 36.0–46.0)
HDL: 63 mg/dL (ref >39)
Hemoglobin: 11.6 g/dL — ABNORMAL LOW (ref 12.0–15.0)
LDL Cholesterol: 73 mg/dL (ref 0–99)
MCHC: 31.5 g/dL (ref 30.0–36.0)
MCV: 92 fL (ref 78.0–100.0)
Platelets: 275 10*3/uL (ref 150–400)
Potassium: 3.8 meq/L (ref 3.5–5.3)
RBC: 4 M/uL (ref 3.87–5.11)
RDW: 15.2 % (ref 11.5–15.5)
Sodium: 138 meq/L (ref 135–145)
Total Bilirubin: 0.4 mg/dL (ref 0.3–1.2)
Total CHOL/HDL Ratio: 2.4
Total Protein: 6.9 g/dL (ref 6.0–8.3)
Triglycerides: 64 mg/dL (ref <150)
VLDL: 13 mg/dL (ref 0–40)
WBC: 5.2 10*3/uL (ref 4.0–10.5)

## 2010-01-20 ENCOUNTER — Encounter: Payer: Self-pay | Admitting: Family Medicine

## 2010-02-06 ENCOUNTER — Encounter: Payer: Self-pay | Admitting: Family Medicine

## 2010-02-06 ENCOUNTER — Ambulatory Visit: Payer: Self-pay | Admitting: Family Medicine

## 2010-02-06 LAB — CONVERTED CEMR LAB
Bilirubin Urine: NEGATIVE
Blood in Urine, dipstick: NEGATIVE
Glucose, Urine, Semiquant: NEGATIVE
Ketones, urine, test strip: NEGATIVE
Nitrite: NEGATIVE
Protein, U semiquant: NEGATIVE
Specific Gravity, Urine: 1.015
Urobilinogen, UA: 0.2
WBC Urine, dipstick: NEGATIVE
Whiff Test: NEGATIVE
pH: 7

## 2010-02-09 LAB — CONVERTED CEMR LAB
Basophils Absolute: 0 10*3/uL (ref 0.0–0.1)
Basophils Relative: 0 % (ref 0–1)
Chlamydia, DNA Probe: NEGATIVE
Eosinophils Absolute: 0.3 10*3/uL (ref 0.0–0.7)
Eosinophils Relative: 4 % (ref 0–5)
GC Probe Amp, Genital: NEGATIVE
HCT: 36.2 % (ref 36.0–46.0)
Hemoglobin: 11.4 g/dL — ABNORMAL LOW (ref 12.0–15.0)
Lymphocytes Relative: 31 % (ref 12–46)
Lymphs Abs: 1.9 10*3/uL (ref 0.7–4.0)
MCHC: 31.5 g/dL (ref 30.0–36.0)
MCV: 90.7 fL (ref 78.0–100.0)
Monocytes Absolute: 0.7 10*3/uL (ref 0.1–1.0)
Monocytes Relative: 11 % (ref 3–12)
Neutro Abs: 3.2 10*3/uL (ref 1.7–7.7)
Neutrophils Relative %: 53 % (ref 43–77)
Platelets: 277 10*3/uL (ref 150–400)
RBC: 3.99 M/uL (ref 3.87–5.11)
RDW: 15.4 % (ref 11.5–15.5)
WBC: 6.1 10*3/uL (ref 4.0–10.5)

## 2010-04-07 ENCOUNTER — Encounter: Payer: Self-pay | Admitting: Family Medicine

## 2010-04-07 ENCOUNTER — Ambulatory Visit: Payer: Self-pay | Admitting: Family Medicine

## 2010-04-07 DIAGNOSIS — M79609 Pain in unspecified limb: Secondary | ICD-10-CM | POA: Insufficient documentation

## 2010-04-07 DIAGNOSIS — Z780 Asymptomatic menopausal state: Secondary | ICD-10-CM | POA: Insufficient documentation

## 2010-04-07 LAB — CONVERTED CEMR LAB
HCT: 36.9 % (ref 36.0–46.0)
Hemoglobin: 12 g/dL (ref 12.0–15.0)
Hgb A1c MFr Bld: 6 %
MCHC: 32.5 g/dL (ref 30.0–36.0)
MCV: 90.2 fL (ref 78.0–100.0)
Platelets: 247 10*3/uL (ref 150–400)
RBC: 4.09 M/uL (ref 3.87–5.11)
RDW: 15.8 % — ABNORMAL HIGH (ref 11.5–15.5)
TSH: 1.337 microintl units/mL (ref 0.350–4.500)
Vit D, 25-Hydroxy: 40 ng/mL (ref 30–89)
Vitamin B-12: 1466 pg/mL — ABNORMAL HIGH (ref 211–911)
WBC: 6.3 10*3/uL (ref 4.0–10.5)

## 2010-04-08 ENCOUNTER — Encounter: Payer: Self-pay | Admitting: Family Medicine

## 2010-04-09 ENCOUNTER — Encounter: Payer: Self-pay | Admitting: Family Medicine

## 2010-05-11 ENCOUNTER — Ambulatory Visit: Payer: Self-pay | Admitting: Family Medicine

## 2010-05-26 ENCOUNTER — Telehealth: Payer: Self-pay | Admitting: Family Medicine

## 2010-05-27 ENCOUNTER — Ambulatory Visit: Payer: Self-pay | Admitting: Family Medicine

## 2010-05-27 DIAGNOSIS — J309 Allergic rhinitis, unspecified: Secondary | ICD-10-CM | POA: Insufficient documentation

## 2010-05-27 HISTORY — DX: Allergic rhinitis, unspecified: J30.9

## 2010-06-10 ENCOUNTER — Telehealth: Payer: Self-pay | Admitting: Family Medicine

## 2010-06-17 ENCOUNTER — Encounter: Payer: Self-pay | Admitting: Family Medicine

## 2010-06-23 ENCOUNTER — Ambulatory Visit: Payer: Self-pay | Admitting: Family Medicine

## 2010-06-25 ENCOUNTER — Ambulatory Visit: Payer: Self-pay | Admitting: Family Medicine

## 2010-06-25 ENCOUNTER — Encounter: Payer: Self-pay | Admitting: *Deleted

## 2010-06-29 ENCOUNTER — Encounter: Payer: Self-pay | Admitting: *Deleted

## 2010-06-30 ENCOUNTER — Ambulatory Visit: Payer: Self-pay | Admitting: Family Medicine

## 2010-07-10 ENCOUNTER — Emergency Department (HOSPITAL_COMMUNITY): Admission: EM | Admit: 2010-07-10 | Discharge: 2010-07-10 | Payer: Self-pay | Admitting: Emergency Medicine

## 2010-07-21 ENCOUNTER — Ambulatory Visit: Payer: Self-pay | Admitting: Family Medicine

## 2010-08-06 ENCOUNTER — Ambulatory Visit: Payer: Self-pay | Admitting: Family Medicine

## 2010-08-26 ENCOUNTER — Ambulatory Visit (HOSPITAL_COMMUNITY): Admission: RE | Admit: 2010-08-26 | Discharge: 2010-08-26 | Payer: Self-pay | Admitting: Family Medicine

## 2010-08-26 ENCOUNTER — Encounter: Payer: Self-pay | Admitting: Family Medicine

## 2010-08-26 ENCOUNTER — Telehealth: Payer: Self-pay | Admitting: *Deleted

## 2010-08-26 ENCOUNTER — Ambulatory Visit: Payer: Self-pay | Admitting: Family Medicine

## 2010-08-26 DIAGNOSIS — R42 Dizziness and giddiness: Secondary | ICD-10-CM | POA: Insufficient documentation

## 2010-08-26 HISTORY — DX: Dizziness and giddiness: R42

## 2010-08-26 LAB — CONVERTED CEMR LAB
Bilirubin Urine: NEGATIVE
Blood Glucose, Fingerstick: 86
Blood in Urine, dipstick: NEGATIVE
Glucose, Urine, Semiquant: NEGATIVE
Hemoglobin: 12.3 g/dL
Ketones, urine, test strip: NEGATIVE
Nitrite: NEGATIVE
Protein, U semiquant: NEGATIVE
Urobilinogen, UA: 0.2
WBC Urine, dipstick: NEGATIVE
pH: 6

## 2010-08-28 ENCOUNTER — Ambulatory Visit: Payer: Self-pay | Admitting: Family Medicine

## 2010-10-06 ENCOUNTER — Encounter: Admission: RE | Admit: 2010-10-06 | Discharge: 2010-10-06 | Payer: Self-pay | Admitting: Family Medicine

## 2010-10-07 ENCOUNTER — Telehealth (INDEPENDENT_AMBULATORY_CARE_PROVIDER_SITE_OTHER): Payer: Self-pay | Admitting: *Deleted

## 2010-10-07 ENCOUNTER — Ambulatory Visit: Payer: Self-pay | Admitting: Family Medicine

## 2010-10-12 ENCOUNTER — Ambulatory Visit: Payer: Self-pay | Admitting: Family Medicine

## 2010-10-12 ENCOUNTER — Encounter: Payer: Self-pay | Admitting: *Deleted

## 2010-10-14 ENCOUNTER — Ambulatory Visit: Payer: Self-pay

## 2010-10-15 ENCOUNTER — Ambulatory Visit (HOSPITAL_COMMUNITY)
Admission: RE | Admit: 2010-10-15 | Discharge: 2010-10-15 | Payer: Self-pay | Source: Home / Self Care | Attending: Family Medicine | Admitting: Family Medicine

## 2010-10-19 ENCOUNTER — Ambulatory Visit: Payer: Self-pay

## 2010-10-29 ENCOUNTER — Ambulatory Visit: Payer: Self-pay | Admitting: Family Medicine

## 2010-10-30 DIAGNOSIS — M545 Low back pain, unspecified: Secondary | ICD-10-CM | POA: Insufficient documentation

## 2010-11-10 ENCOUNTER — Telehealth: Payer: Self-pay | Admitting: Family Medicine

## 2010-11-13 ENCOUNTER — Ambulatory Visit: Admit: 2010-11-13 | Payer: Self-pay

## 2010-11-16 ENCOUNTER — Ambulatory Visit: Admission: RE | Admit: 2010-11-16 | Discharge: 2010-11-16 | Payer: Self-pay | Source: Home / Self Care

## 2010-11-16 DIAGNOSIS — I959 Hypotension, unspecified: Secondary | ICD-10-CM | POA: Insufficient documentation

## 2010-11-16 LAB — CONVERTED CEMR LAB: Hgb A1c MFr Bld: 5.8 %

## 2010-11-22 ENCOUNTER — Encounter: Payer: Self-pay | Admitting: Occupational Therapy

## 2010-11-23 ENCOUNTER — Encounter: Payer: Self-pay | Admitting: Family Medicine

## 2010-11-25 ENCOUNTER — Encounter
Admission: RE | Admit: 2010-11-25 | Discharge: 2010-12-01 | Payer: Self-pay | Source: Home / Self Care | Attending: Family Medicine | Admitting: Family Medicine

## 2010-11-30 ENCOUNTER — Encounter: Payer: Self-pay | Admitting: Family Medicine

## 2010-11-30 ENCOUNTER — Ambulatory Visit
Admission: RE | Admit: 2010-11-30 | Discharge: 2010-11-30 | Payer: Self-pay | Source: Home / Self Care | Attending: Family Medicine | Admitting: Family Medicine

## 2010-11-30 DIAGNOSIS — M654 Radial styloid tenosynovitis [de Quervain]: Secondary | ICD-10-CM

## 2010-11-30 HISTORY — DX: Radial styloid tenosynovitis (de quervain): M65.4

## 2010-12-01 NOTE — Progress Notes (Signed)
Summary: Medication  Medications Added ISOSORBIDE MONONITRATE CR 60 MG XR24H-TAB (ISOSORBIDE MONONITRATE) once daily per cards DILTIAZEM HCL 30 MG TABS (DILTIAZEM HCL) two times a day per cards       Phone Note Call from Patient Call back at Home Phone [phone]   Reason for Call: Talk to Nurse Summary of Call: pt needs to speak with RN re: her medication Initial call taken by: Knox Royalty,  January 14, 2010 1:52 PM  Follow-up for Phone Call        states her cardiologist added isosorbide 60mg  daily & dilatiazem 30mg  two times a day. needs refills on metformin,spirolactone,iron,hydralazine. refilled them to [facility] on [address]  Follow-up by: Golden Circle RN,  January 14, 2010 2:15 PM    New/Updated Medications: ISOSORBIDE MONONITRATE CR 60 MG XR24H-TAB (ISOSORBIDE MONONITRATE) once daily per cards DILTIAZEM HCL 30 MG TABS (DILTIAZEM HCL) two times a day per cards

## 2010-12-01 NOTE — Assessment & Plan Note (Signed)
Summary: read PPD  Nurse Visit   Allergies: 1)  ! Acetyl Salicylic Acid (Aspirin) 2)  ! Nsaids  PPD Results    Date of reading: 06/25/2010    Results: 0 mm    Interpretation: negative  Orders Added: 1)  No Charge Patient Arrived (NCPA0) [NCPA0]

## 2010-12-01 NOTE — Assessment & Plan Note (Signed)
Summary: pain in stomach,tcb   Vital Signs:  Patient profile:   55 year old female Height:      64.75 inches Weight:      298 pounds BMI:     50.15 BSA:     2.34 Temp:     98.6 degrees F Pulse rate:   77 / minute BP sitting:   130 / 76  Vitals Entered By: Jone Baseman CMA (February 06, 2010 9:54 AM) CC: lower abdominal pain Is Patient Diabetic? Yes Pain Assessment Patient in pain? yes     Location: lower stomach Intensity: 5   Primary Care Provider:  Ancil Boozer  MD  CC:  lower abdominal pain.  History of Present Illness: lower abdominal pain: started probably 1 mo ago - mild and very intermittent at that point at which she chose to monitor it.  in past week however pain has become more constant and has worsened.  during the day it is dull, mild and at night when she goes to rest it is worsened.  she denies fevers, states she is passing her bowels normally with formed soft stools and she denies vomiting.  sometimes the pain is sharp.  she denies rectal bleeding.  she does state maybe she has a little worsening pain with urinating or with eating meals but she cannot think of anything else that makes it worse.  she is exercising more regularly at [employer].  she denies any vaginal discharge.  she does report concerns about her partner's faithfulness however.   in addition today she requests a refill on her insomnia medication.  it is helping when needed.  Habits & Providers  Alcohol-Tobacco-Diet     Tobacco Status: never  Current Medications (verified): 1)  Accupril 40 Mg Tabs (Quinapril Hcl) .... Take 1 Tablet By Mouth Once A Day 2)  Clonidine Hcl 0.2 Mg Tab (Clonidine Hcl) .... Take 1 Tablet By Mouth Twice A Day 3)  Furosemide 20 Mg Tabs (Furosemide) .... Take 1 Tab By Mouth Daily As Need For Swelling. 4)  Metformin Hcl 500 Mg Tabs (Metformin Hcl) .... Take 1 Tablet By Mouth Twice A Day 5)  Tylenol Arthritis Pain 650 Mg Cr-Tabs (Acetaminophen) .Marland Kitchen.. 1 By Mouth Two Times A  Day To Three Times A Day As Needed Pain 6)  Colace 100 Mg  Caps (Docusate Sodium) .... Take 1 By Mouth Qd 7)  Ferrous Sulfate 325 (65 Fe) Mg  Tabs (Ferrous Sulfate) .... Take 1 By Mouth Tid 8)  Polyethylene Glycol 3350  Powd (Polyethylene Glycol 3350) .... Dissolve 17 G in 4-8 Oz of Water of Juice Bid 9)  Nitroglycerin 0.4 Mg Subl (Nitroglycerin) .Marland Kitchen.. 1 Tab Sublingual Q 5 Minutes As Needed Chest Pain.  If 3 Doses Does Not Relieve Pain, Call 911 10)  Spironolactone 50 Mg Tabs (Spironolactone) .Marland Kitchen.. 1 By Mouth Once Daily For Blood Pressure 11)  Ambien 10 Mg Tabs (Zolpidem Tartrate) .... 1/2 - 1 By Mouth At Bedtime As Needed Insomnia 12)  Centrum Silver  Tabs (Multiple Vitamins-Minerals) 13)  B Complex  Tabs (B Complex Vitamins) 14)  Oyst-Cal D 250-125 Mg-Unit Tabs (Calcium Carbonate-Vitamin D) 15)  Hydralazine Hcl 25 Mg Tabs (Hydralazine Hcl) .Marland Kitchen.. 1 By Mouth Three Times A Day For Blood Pressure 16)  Carvedilol 25 Mg Tabs (Carvedilol) .Marland Kitchen.. 1 By Mouth Two Times A Day For Blood Pressure.  Replaces Cardiazem and Metoprolol. 17)  Nexium 40 Mg Cpdr (Esomeprazole Magnesium) .... Take 1 Tab Each Morning 18)  Hydrocortisone 2.5 %  Oint (Hydrocortisone) .... Apply To Rash Two Times A Day For 10 Day Then As Needed, 60 Gm 19)  Isosorbide Mononitrate Cr 60 Mg Xr24h-Tab (Isosorbide Mononitrate) .... Once Daily Per Cards 20)  Diltiazem Hcl 30 Mg Tabs (Diltiazem Hcl) .... Two Times A Day Per Cards  Allergies (verified): 1)  ! Acetyl Salicylic Acid (Aspirin) 2)  ! Nsaids  Past History:  Past medical, surgical, family and social histories (including risk factors) reviewed, and no changes noted (except as noted below).  Past Medical History: Reviewed history from 07/24/2009 and no changes required. *******ASA/ NSAIDS CONTRAINDICATED ( recurrent GI bleed, see below) -- PANDIVERTICULOSIS ( Colonoscopy Dr. Arlyce Dice in 2007) --Gastrointestinal hemorrhage:   1- hx of  GI  bleed when on NSAIDS (rectal bleeding x2   while on  tx  for knee                                                                     pain in 2002)                                                    2-- -GI BLEED RECURRENT  x4 :  2 to colon diverticula (11/03/05 x2).                                                                                F/u with Surgery: Dr. 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  -creatinine 0.7 10/06 ( 2+ microalbumin 10/06)  Past Surgical History: Reviewed history from 09/15/2008 and no changes required. -Abd. 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 -Colonoscopy (difuse diverticulosis) - 03/01/2006 Dr. Arlyce Dice -EGD: normal ( 03/2006) - CT - Head 10/06 (L putamen infarct) - 10/28/2005 - Echo EF: 60 % mild/mod LVH - 08/19/2005, - exercise cardiolite(EF 67%/nl perfusion) - 10/01/2005 - hysterectomy - complete   95 - 07/21/2004 - knee replacement bilat. 98 - 07/21/2004  Family History: Reviewed history from 07/26/2007 and no changes required. Colon CA 1st degree, Diabetes 1st degree, hypertension, stroke HTN ( brother) DM (brother)  Social History: Reviewed history from 09/15/2008 and no changes required. Awaiting for disability. Lives with 2  MR sisters ( she takes care of them by  her self )   Quit smoking marihuana in 2000  Currently no tobacco abuse. Quitted in  1999 (smoked everyday during 22 years). Used  to go to [employer] for  weight loss. Currently doing water aerobics  to loose weight. Mom died at 54 yo 2 to DM. Father died 2 to colon CA in  1980's. 5 Brothers  (1948) , 1949 living in [place], [place] in [place], [place] in [place] in [place].  3 sisters 20, 3 ( both MR) now living with her , and 16 in [place]  Review of Systems       per HPI  Physical Exam  General:  alert, morbidly obese VS reviewed Abdomen:  obese so examination is somewhat limited.  soft.  normoactive bowel sounds.  nondistended.  no masses appreciated.  mildly tender RUQ and suprapubic region.  no hernias apprecaited.  no rebound or guarding Genitalia:  normal introitus, no external lesions, no vaginal discharge, and mucosa pink and moist.  vaginal cuff visualized and appears intact.  no pain or abnormal masses felt on manual examiation   Impression & Recommendations:  Problem # 1:  ABDOMINAL PAIN, LOWER (ICD-789.09) Assessment New unclear etiology at this point.  certainly is at high risk for diverticulitis (with walling off hence no fever) given known diverticulosis.  also high on the list is msk strain from exercise or hernia that is unable to be palpated 2/2 habitus. unlikely to have PID (no connection at this point to pelvis given hysterectomy status), UA WNL so doesn't appear to be UA.  recent LFTS on 3/21 WNL.   to help differentiate - CBC with diff - if elevated WBC then rx as if divertulitis and monitor for improvement.  this should point to any infectious process - most likely of which is diverticulitis.   redflags given for return or er visit. good number to call patient:  [phone]   Her updated medication list for this problem includes:    Tylenol Arthritis Pain 650 Mg Cr-tabs (Acetaminophen) .Marland Kitchen... 1 by mouth two times a day to three times a day as needed pain  Orders: Urinalysis-[facility] (00000) CBC w/Diff-[facility] (13086) GC/Chlamydia-[facility] (87591/87491) [facility]- Est  Level 4 (57846)  Complete Medication List: 1)  Accupril 40 Mg Tabs  (Quinapril hcl) .... Take 1 tablet by mouth once a day 2)  Clonidine Hcl 0.2 Mg Tab (Clonidine hcl) .... Take 1 tablet by mouth twice a day 3)  Furosemide 20 Mg Tabs (Furosemide) .... Take 1 tab by mouth daily as need for swelling. 4)  Metformin Hcl 500 Mg Tabs (Metformin hcl) .... Take 1 tablet by mouth twice a day 5)  Tylenol Arthritis Pain 650 Mg Cr-tabs (Acetaminophen) .Marland Kitchen.. 1 by mouth two times a day to three times a day as needed pain 6)  Colace 100 Mg Caps (Docusate sodium) .... Take 1 by mouth qd 7)  Ferrous Sulfate 325 (65 Fe) Mg Tabs (Ferrous sulfate) .... Take 1 by mouth tid 8)  Polyethylene Glycol 3350 Powd (Polyethylene glycol 3350) .... Dissolve 17 g in 4-8 oz of water of juice bid 9)  Nitroglycerin 0.4 Mg Subl (Nitroglycerin) .Marland Kitchen.. 1 tab sublingual q 5 minutes as needed chest pain.  if 3 doses does not relieve pain, call 911 10)  Spironolactone 50 Mg Tabs (Spironolactone) .Marland Kitchen.. 1 by mouth once daily for blood pressure 11)  Ambien 10 Mg Tabs (Zolpidem tartrate) .... 1/2 - 1 by mouth at bedtime as needed insomnia 12)  Centrum Silver Tabs (Multiple vitamins-minerals)  13)  B Complex Tabs (B complex vitamins) 14)  Oyst-cal D 250-125 Mg-unit Tabs (Calcium carbonate-vitamin d) 15)  Hydralazine Hcl 25 Mg Tabs (Hydralazine hcl) .Marland Kitchen.. 1 by mouth three times a day for blood pressure 16)  Carvedilol 25 Mg Tabs (Carvedilol) .Marland Kitchen.. 1 by mouth two times a day for blood pressure.  replaces cardiazem and metoprolol. 17)  Nexium 40 Mg Cpdr (Esomeprazole magnesium) .... Take 1 tab each morning 18)  Hydrocortisone 2.5 % Oint (Hydrocortisone) .... Apply to rash two times a day for 10 day then as needed, 60 gm 19)  Isosorbide Mononitrate Cr 60 Mg Xr24h-tab (Isosorbide mononitrate) .... Once daily per cards 20)  Diltiazem Hcl 30 Mg Tabs (Diltiazem hcl) .... Two times a day per cards  Other Orders: HIV-[facility] ([id number]) [facility] [phone]) [facility] [phone])  Patient Instructions: 1)  If there is  anything unusual on your labs when they return tomorrow I will call you and call in antiobiotics if necessary. 2)  Continue to monitor your pain and symptoms - if it get unbearable, gets an associated fever, blood in your stool or you cannot pass your stools get checked out right away.  Laboratory Results   Urine Tests  Date/Time Received: February 06, 2010 10:07 AM  Date/Time Reported: February 06, 2010 10:13 AM   Routine Urinalysis   Color: yellow Appearance: Clear Glucose: negative   (Normal Range: Negative) Bilirubin: negative   (Normal Range: Negative) Ketone: negative   (Normal Range: Negative) Spec. Gravity: 1.015   (Normal Range: 1.003-1.035) Blood: negative   (Normal Range: Negative) pH: 7.0   (Normal Range: 5.0-8.0) Protein: negative   (Normal Range: Negative) Urobilinogen: 0.2   (Normal Range: 0-1) Nitrite: negative   (Normal Range: Negative) Leukocyte Esterace: negative   (Normal Range: Negative)    Comments: ...............test performed by......Marland KitchenBonnie A. Swaziland, MLS (ASCP)cm  Date/Time Received: February 06, 2010 10:49 AM  Date/Time Reported: February 06, 2010 10:55 AM   [facility] Source: vaginal WBC/hpf: 1-5 Bacteria/hpf: 3+  Rods Clue cells/hpf: none  Negative whiff Yeast/hpf: none Trichomonas/hpf: none Comments: ...........test performed by...........Marland KitchenTerese Door, CMA     Prevention & Chronic Care Immunizations   Influenza vaccine: Fluvax Non-MCR  (08/18/2009)   Influenza vaccine deferral: Not available  (06/13/2009)   Influenza vaccine due: 08/06/2009    Tetanus booster: 01/31/2004: Done.   Tetanus booster due: 01/30/2014    Pneumococcal vaccine: Not documented  Colorectal Screening   Hemoccult: Not documented   Hemoccult due: Not Indicated    Colonoscopy: Done.  (03/01/2006)   Colonoscopy due: 03/01/2016  Other Screening   Pap smear: Done.  (01/31/2004)   Pap smear due: Not Indicated    Mammogram: Normal  (09/26/2009)   Mammogram due:  10/2010   Smoking status: never  (02/06/2010)  Diabetes Mellitus   HgbA1C: 6.0  (01/14/2010)   Hemoglobin A1C due: 10/09/2008    Eye exam: No diabetic retinopathy.     (01/05/2010)    Foot exam: yes  (01/14/2010)   Foot exam action/deferral: Do today   High risk foot: Not documented   Foot care education: Not documented    Urine microalbumin/creatinine ratio: Not documented   Urine microalbumin action/deferral: Not indicated  Lipids   Total Cholesterol: 149  (01/19/2010)   LDL: 73  (01/19/2010)   LDL Direct: 86  (06/13/2009)   HDL: 63  (01/19/2010)   Triglycerides: 64  (01/19/2010)   Lipid panel due: 01/15/2011  Hypertension   Last Blood Pressure: 130 /  76  (02/06/2010)   Serum creatinine: 0.77  (01/19/2010)   Serum potassium 3.8  (01/19/2010)    Hypertension flowsheet reviewed?: Yes   Progress toward BP goal: At goal  Self-Management Support :   Personal Goals (by the next clinic visit) :     Personal A1C goal: 8  (07/24/2009)     Personal blood pressure goal: 130/80  (07/24/2009)     Personal LDL goal: 100  (06/13/2009)    Diabetes self-management support: Not documented    Diabetes self-management support not done because: Good outcomes  (08/12/2009)    Hypertension self-management support: BP self-monitoring log  (07/24/2009)    Hypertension self-management support not done because: Good outcomes  (02/06/2010)

## 2010-12-01 NOTE — Progress Notes (Signed)
Summary: triage  Phone Note Call from Patient Call back at [phone]   Caller: Patient Summary of Call: wants to talk to nurse about allergies/breathing Initial call taken by: De Nurse,  May 26, 2010 1:58 PM  Follow-up for Phone Call        LM Follow-up by: Golden Circle RN,  May 26, 2010 2:01 PM  Additional Follow-up for Phone Call Additional follow up Details #1::        coughing & having allergies to outside pollen. worse at night x 1 wk. will be here at 8:30 tomorrow am Additional Follow-up by: Golden Circle RN,  May 26, 2010 2:08 PM

## 2010-12-01 NOTE — Progress Notes (Signed)
Summary: phn msg  Phone Note Call from Patient Call back at Home Phone [phone]   Caller: Patient Summary of Call: pt took her BP at home and it was 96/66 - pls advise Initial call taken by: De Nurse,  October 07, 2010 2:55 PM  Follow-up for Phone Call        patient states she has recently cut the Accupril to 1/2 tab. as directed . states she is trying to lose weight and now weighs 282.  advised her to come to office today and we will check BP  and advise further. . Follow-up by: Theresia Lo RN,  October 07, 2010 3:40 PM

## 2010-12-01 NOTE — Progress Notes (Signed)
Summary: phn msg  Phone Note Call from Patient Call back at [phone]   Caller: Patient Summary of Call: Pt wanted Dr. Sandi Mealy to know that a diabetic company would be sending a request for a back brace that she is requesting. Initial call taken by: Clydell Hakim,  June 10, 2010 11:07 AM    Noted

## 2010-12-01 NOTE — Assessment & Plan Note (Signed)
Summary: WI for syncopal episode/[place]ham/kf   Vital Signs:  Patient profile:   55 year old female Height:      64.75 inches Weight:      298 pounds BMI:     50.15 Temp:     98.3 degrees F oral Pulse (ortho):   80 / minute BP sitting:   139 / 88  (left arm) BP standing:   132 / 88 Cuff size:   large  Vitals Entered By: Jimmy Footman, CMA (August 26, 2010 2:01 PM)  Serial Vital Signs/Assessments:  Time      Position  BP       Pulse  Resp  Temp     By 3:39 PM   Lying RA  141/81   78                    Tessie Fass CMA 3:40 PM   Sitting   121/79   73                    Tessie Fass CMA 3:41 PM   Standing  132/88   80                    Robert Busick CMA  Comments: 3:40 PM pt reports slight dizziness upon sitting By: Tessie Fass CMA  3:41 PM pt reports feeling dizzy upon standing By: Tessie Fass CMA   CC: dizziness x2 days Is Patient Diabetic? Yes Did you bring your meter with you today? No Pain Assessment Patient in pain? no      CBG Result 86   Primary Care Provider:  Alvia Grove DO  CC:  dizziness x2 days.  History of Present Illness: [name] is a pleasant 55 yo AA female with hx significant for htn, DM, GI bleeds, CVA, kidney cyst and intermittent chest pain managed by Dr. Lavonna Rua  [facility]).  She presents to clinic with c/o diaphoreis, nausea, double vision and dizziness which awakened her from sleep the night prior to presentation.  The dizziness continued x 2 days and was associated with bp of 79/60, 175/77 and 107/70 per pt report.  She reports that her glucose was 197 at home and that is the highest that she has ever recorded.  The lowest glucose over this episode was 97.  She denies chest pain, sob, nausea, vomiting, changes in bowel habits including hematechezia, melena, constipation or diarrhea.  most recent ECHO in 08/2005 showed EF=60% mild/mod LVH; Exercise cardiolite 10/2005 showed 67%EF/normal perfusion. most recent colonoscopy 03/2006  showed diffuse diverticulosis (Dr. Gerald Leitz)  Current Medications (verified): 1)  Accupril 40 Mg Tabs (Quinapril Hcl) .... Take 1 Tablet By Mouth Once A Day 2)  Clonidine Hcl 0.2 Mg Tab (Clonidine Hcl) .... Take 1 Tablet By Mouth Twice A Day 3)  Furosemide 20 Mg Tabs (Furosemide) .... Take 1 Tab By Mouth Daily As Need For Swelling. 4)  Metformin Hcl 500 Mg Tabs (Metformin Hcl) .... Take 1 Tablet By Mouth Twice A Day 5)  Tylenol Arthritis Pain 650 Mg Cr-Tabs (Acetaminophen) .Marland Kitchen.. 1 By Mouth Two Times A Day To Three Times A Day As Needed Pain 6)  Colace 100 Mg  Caps (Docusate Sodium) .... Take 1 By Mouth Qd 7)  Ferrous Sulfate 325 (65 Fe) Mg  Tabs (Ferrous Sulfate) .... Take 1 By Mouth Tid 8)  Polyethylene Glycol 3350  Powd (Polyethylene Glycol 3350) .... Dissolve 17 G in 4-8 Oz of Water of Juice Bid  9)  Nitroglycerin 0.4 Mg Subl (Nitroglycerin) .Marland Kitchen.. 1 Tab Sublingual Q 5 Minutes As Needed Chest Pain.  If 3 Doses Does Not Relieve Pain, Call 911 10)  Spironolactone 50 Mg Tabs (Spironolactone) .Marland Kitchen.. 1 By Mouth Once Daily For Blood Pressure 11)  Ambien 10 Mg Tabs (Zolpidem Tartrate) .... 1/2 - 1 By Mouth At Bedtime As Needed Insomnia 12)  Centrum Silver  Tabs (Multiple Vitamins-Minerals) 13)  B Complex  Tabs (B Complex Vitamins) 14)  Oyst-Cal D 250-125 Mg-Unit Tabs (Calcium Carbonate-Vitamin D) 15)  Hydralazine Hcl 25 Mg Tabs (Hydralazine Hcl) .Marland Kitchen.. 1 By Mouth Three Times A Day For Blood Pressure 16)  Carvedilol 25 Mg Tabs (Carvedilol) .Marland Kitchen.. 1 By Mouth Two Times A Day For Blood Pressure.  Replaces Cardiazem and Metoprolol. 17)  Nexium 40 Mg Cpdr (Esomeprazole Magnesium) .... Take 1 Tab Each Morning 18)  Hydrocortisone 2.5 % Oint (Hydrocortisone) .... Apply To Rash Two Times A Day For 10 Day Then As Needed, 60 Gm 19)  Isosorbide Mononitrate Cr 60 Mg Xr24h-Tab (Isosorbide Mononitrate) .... Once Daily Per Cards 20)  Diltiazem Hcl 30 Mg Tabs (Diltiazem Hcl) .... Two Times A Day Per Cards 21)  Fexofenadine  Hcl 180 Mg Tabs (Fexofenadine Hcl) .... Sig Take 1 Tab By Mouth One Time Daily Disp 30 22)  Flonase 50 Mcg/act Susp (Fluticasone Propionate) .... Sig: 2 Sprays/nostril One Time Daily in The Morning After Nasal Rinse Disp 1 Unit  Allergies (verified): 1)  ! Acetyl Salicylic Acid (Aspirin) 2)  ! Nsaids  Past History:  Past medical, surgical, family and social histories (including risk factors) reviewed for relevance to current acute and chronic problems.  Past Medical History: *******ASA/ NSAIDS CONTRAINDICATED ( recurrent GI bleed, see below)    -- PANDIVERTICULOSIS ( Colonoscopy Dr. Arlyce Dice in 2007)    --Gastrointestinal hemorrhage:      1- hx of  GI  bleed when on NSAIDS (rectal bleeding x2  while on  tx  for knee pain in 2002)    2 - GI BLEED RECURRENT x 4 : 2/2  to colon diverticula (11/03/05 x2). F/u with Surgery: Dr. Purnell Shoemaker. 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  - Creatinine 0.7 10/06 ( 2+ microalbumin 10/06) - POSTMENOPAUSAL STATUS (ICD-V49.81) - VENOUS INSUFFICIENCY, CHRONIC (ICD-459.81)  Past Surgical History: - Abd. 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 - Colonoscopy (difuse diverticulosis) - 03/01/2006 Dr. Arlyce Dice - EGD: normal ( 03/2006) - CT - Head 10/06 (L putamen infarct) - 10/28/2005 - Echo EF: 60 % mild/mod LVH - 08/19/2005, -  exercise cardiolite(EF 67%/nl perfusion) - 10/01/2005 - hysterectomy - complete   95 - 07/21/2004 - knee replacement bilat. 98 - 07/21/2004  Family History: Reviewed history from 04/09/2010 and no changes required. Colon CA 1st degree, Diabetes 1st degree, hypertension, stroke HTN ( brother) DM (brother) Mom died at  37 yo 2 to DM. Father died 2 to colon CA in  1980's. mental retardation in sisters  Social History: Reviewed history from 04/09/2010 and no changes required. Awaiting for disability. Lives with 2  MR sisters ( she takes care of them by her self - one is patient here - [name] )   Quit smoking THC in 2000  Currently no tobacco abuse. Quit in  1999 (smoked everyday during 22 years). Used  to go to [employer] for weight loss. Currently doing water aerobics or going to [employer]  to lose weight.  5 Brothers  (1948) , 1949 living in [place], [place] in [place], [place] in [place] in [place].  3 sisters 87, 62 ( both MR) now living with her , and 86 in [place]  Review of Systems      See HPI   Impression & Recommendations:  Problem # 1:  DIZZINESS (ICD-780.4) Assessment New  Likely 2/2 orthostatic hypotension. DDX: Neuro vs Cardiac vs Psych. Oculomotor, Romberg, Dix-Hallpike maneuver negative thus less likely neuro component to pts c/o dizziness; assessed social factors which may contribute to anxiety attacks, pt denies any negative psychosocial changes and notes that intellectually disabled sister is doing better living with pt since being released from psych hospital. EKG WNL.  Her updated medication list for this problem includes:    Fexofenadine Hcl 180 Mg Tabs (Fexofenadine hcl) ..... 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 although no recent change in dosages will:       a. decrease Accupril (Quinipril) dosage by half to 20mg  by by  mouth once daily       b. will decrease alpha2 agonist (clonidine)       c. pt will follow-up in 2 days will also re-visit Abd CT findings of renal cysts with pt which patient was unaware  Orders: [facility]- Est  Level 4 (57846)  Problem # 3:  CYST, KIDNEY, ACQUIRED (ICD-593.2)  Orders: [facility]- Est  Level 4 (99214)  Complete Medication List: 1)  Accupril 40 Mg Tabs (Quinapril hcl) .... Take 1 tablet by mouth once a day 2)  Clonidine Hcl 0.2 Mg Tab (Clonidine hcl) .... Take 1 tablet by mouth twice a day 3)  Furosemide 20 Mg Tabs (Furosemide) .... Take 1 tab by mouth daily as need for swelling. 4)  Metformin Hcl 500 Mg Tabs (Metformin hcl) .... Take 1 tablet by mouth twice a day 5)  Tylenol Arthritis Pain 650 Mg Cr-tabs (Acetaminophen) .Marland Kitchen.. 1 by mouth two times a day to three times a day as needed pain 6)  Colace 100 Mg Caps (Docusate sodium) .... Take 1 by mouth qd 7)  Ferrous Sulfate 325 (65 Fe) Mg Tabs (Ferrous sulfate) .... Take 1 by mouth tid 8)  Polyethylene Glycol 3350 Powd (Polyethylene glycol 3350) .... Dissolve 17 g in 4-8 oz of water of juice bid 9)  Nitroglycerin 0.4 Mg Subl (Nitroglycerin) .Marland Kitchen.. 1 tab sublingual q 5 minutes as needed chest pain.  if 3 doses does not relieve pain, call 911 10)  Spironolactone 50 Mg Tabs (Spironolactone) .Marland Kitchen.. 1 by mouth once daily for blood pressure 11)  Ambien 10 Mg Tabs (Zolpidem tartrate) .... 1/2 - 1 by mouth at bedtime as needed insomnia 12)  Centrum Silver Tabs (Multiple vitamins-minerals) 13)  B Complex Tabs (B complex vitamins) 14)  Oyst-cal D 250-125 Mg-unit Tabs (Calcium carbonate-vitamin d) 15)  Hydralazine Hcl 25 Mg Tabs (Hydralazine hcl) .Marland Kitchen.. 1 by mouth three times a day for blood pressure 16)  Carvedilol 25 Mg Tabs (Carvedilol) .Marland Kitchen.. 1 by mouth two times a day for blood pressure.  replaces cardiazem and metoprolol. 17)  Nexium 40 Mg Cpdr (Esomeprazole magnesium) .... Take 1 tab each morning 18)  Hydrocortisone 2.5 % Oint (Hydrocortisone)  .... Apply to rash two times a day for 10 day then as needed, 60 gm 19)  Isosorbide Mononitrate Cr 60 Mg Xr24h-tab (Isosorbide mononitrate) .... Once daily per cards 20)  Diltiazem Hcl 30 Mg Tabs (Diltiazem hcl) .... Two times a day per cards 21)  Fexofenadine Hcl 180 Mg Tabs (Fexofenadine hcl) .... Sig take 1 tab by mouth one time daily disp 30 22)  Flonase 50 Mcg/act Susp (Fluticasone propionate) .... Sig: 2 sprays/nostril one time daily in the morning after nasal rinse disp 1 unit  Other Orders: Glucose Cap-[facility] (04540) Urinalysis-[facility] (00000) Hemoglobin-[facility] (98119) EKG- [facility] (EKG)  Patient Instructions: 1)  It was nice to meet you today! 2)  We are adjusting your blood pressure medications today. 3)  Clonidine: decrease to 1/2 tab daily 4)  Accupril: decrease to 1/2 tab daily 5)  Follow up with your cardiologist.   Orders Added: 1)  Glucose Cap-[facility] [82948] 2)  Urinalysis-[facility] [00000] 3)  Hemoglobin-[facility] [85018] 4)  EKG- [facility] [EKG] 5)  [facility]- Est  Level 4 [14782]    Laboratory Results   Urine Tests  Date/Time Received: August 26, 2010 3:10 PM  Date/Time Reported: August 26, 2010 3:30 PM   Routine Urinalysis   Color: yellow Appearance: Clear Glucose: negative   (Normal Range: Negative) Bilirubin: negative   (Normal Range: Negative) Ketone: negative   (Normal Range: Negative) Spec. Gravity: .1.025   (Normal Range: 1.003-1.035) Blood: negative   (Normal Range: Negative) pH: 6.0   (Normal Range: 5.0-8.0) Protein: negative   (Normal Range: Negative) Urobilinogen: 0.2   (Normal Range: 0-1) Nitrite: negative   (Normal Range: Negative) Leukocyte Esterace: negative   (Normal Range: Negative)    Comments: ...........test performed by...........Marland KitchenTerese Door, CMA   Blood Tests   Date/Time Received: August 26, 2010 3:10 PM  Date/Time Reported: August 26, 2010 3:31 PM   CBG Random:: 86mg /dL   CBC   HGB:  95.6 g/dL   (Normal Range: 21.3-08.6 in Males, 12.0-15.0 in  Females) Comments: ...........test performed by...........Marland KitchenTerese Door, CMA

## 2010-12-01 NOTE — Consult Note (Signed)
Summary: [facility]   Imported By: Clydell Hakim 07/09/2010 15:52:48  _____________________________________________________________________  External Attachment:    Type:   Image     Comment:   External Document

## 2010-12-01 NOTE — Miscellaneous (Signed)
Summary: patient summary  Clinical Lists Changes  Problems: Removed problem of FATIGUE (ICD-780.79) Removed problem of CONTACT OR EXPOSURE TO OTHER VIRAL DISEASES (ICD-V01.79) Removed problem of ABDOMINAL PAIN, LOWER (ICD-789.09) Removed problem of LOW BACK PAIN, ACUTE (ICD-724.2) Removed problem of XERODERMA (ICD-701.1) Removed problem of URI (ICD-465.9) Removed problem of FINGER PAIN (ICD-729.5) Removed problem of HEADACHE (ICD-784.0) Observations: Added new observation of FAMILY HX: Colon CA 1st degree, Diabetes 1st degree, hypertension, stroke HTN ( brother) DM (brother) Mom died at 75 yo 2 to DM. Father died 2 to colon CA in  1980's. mental retardation in sisters (04/09/2010 14:29) Added new observation of SOCIAL HX: Awaiting for disability. Lives with 2  MR sisters ( she takes care of them by her self - one is patient here - [name] )   Quit smoking THC in 2000  Currently no tobacco abuse. Quit in  1999 (smoked everyday during 22 years). Used  to go to [employer] for weight loss. Currently doing water aerobics or going to [employer]  to lose weight.  5 Brothers  (1948) , 1949 living in [place], [place] in [place], [place] in [place] in [place].  3 sisters 59, 2 ( both MR) now living with her , and 67 in [place] (04/09/2010 14:29) Added new observation of PAST MED HX: *******ASA/ NSAIDS CONTRAINDICATED ( recurrent GI bleed, see below) -- PANDIVERTICULOSIS ( Colonoscopy Dr. Arlyce Dice in 2007) --Gastrointestinal hemorrhage:   1- hx of  GI  bleed when on NSAIDS (rectal bleeding x2  while on  tx  for knee                                                                     pain in 2002)                                                    2-- -GI BLEED RECURRENT  x4 :  2 to colon diverticula (11/03/05 x2).                                                                                F/u with Surgery: Dr. Purnell Shoemaker anemia, iron deficiency as a result of above -Obesity - Intermittent CP ( Dr  Algie Coffer) -HTN poorly controlled and complicated ( Old stroke found in head CT without physical sequelae:L putamen infarct) -Cerebrovascular accident, hx of - Right kidney cyst ( increased size on 03/08, consider imaging follow up) - low back pain on as needed Vicodin - Diabetes Mellitus Type 2, Borderline.  -creatinine 0.7 10/06 ( 2+ microalbumin 10/06) POSTMENOPAUSAL STATUS (ICD-V49.81) VENOUS INSUFFICIENCY, CHRONIC (ICD-459.81)     (04/09/2010 14:29)     Past, Family, and Social History Past History: *******ASA/ NSAIDS CONTRAINDICATED ( recurrent GI bleed, see below) -- PANDIVERTICULOSIS ( Colonoscopy Dr. Arlyce Dice in  2007) --Gastrointestinal hemorrhage:   1- hx of  GI  bleed when on NSAIDS (rectal bleeding x2  while on  tx  for knee                                                                     pain in 2002)                                                    2-- -GI BLEED RECURRENT  x4 :  2 to colon diverticula (11/03/05 x2).                                                                                F/u with Surgery: Dr. 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  -creatinine 0.7 10/06 ( 2+ microalbumin 10/06) POSTMENOPAUSAL STATUS (ICD-V49.81) VENOUS INSUFFICIENCY, CHRONIC (ICD-459.81)     Family History: Colon CA 1st degree, Diabetes 1st degree, hypertension, stroke HTN ( brother) DM (brother) Mom died at 21 yo 2 to DM. Father died 2 to colon CA in  56's. mental retardation in sisters Social History: Awaiting for disability. Lives with 2  MR sisters ( she takes care of them by her self - one is patient here -  [name] )   Quit smoking THC in 2000  Currently no tobacco abuse. Quit in  1999 (smoked everyday during 22 years). Used  to go to [employer] for weight loss. Currently doing water aerobics or going to [employer]  to lose weight.  5 Brothers  (1948) , 1949 living in [place], [place] in [place], [place] in [place] in [place].  3 sisters 73, 107 ( both MR) now living with her , and 46 in [place]

## 2010-12-01 NOTE — Assessment & Plan Note (Signed)
Summary: flu shot/eo  Nurse Visit   Allergies: 1)  ! Acetyl Salicylic Acid (Aspirin) 2)  ! Nsaids  Immunizations Administered:  Influenza Vaccine # 1:    Vaccine Type: Fluvax MCR    Site: right deltoid    Mfr: [employer]oSmithKline    Dose: 0.5 ml    Route: IM    Given by: Jone Baseman CMA    Exp. Date: 04/28/2011    Lot #: [id number]    VIS given: 05/26/10 version given August 06, 2010.  Flu Vaccine Consent Questions:    Do you have a history of severe allergic reactions to this vaccine? no    Any prior history of allergic reactions to egg and/or gelatin? no    Do you have a sensitivity to the preservative Thimersol? no    Do you have a past history of Guillan-Barre Syndrome? no    Do you currently have an acute febrile illness? no    Have you ever had a severe reaction to latex? no    Vaccine information given and explained to patient? yes    Are you currently pregnant? no  Orders Added: 1)  Influenza Vaccine MCR [00025] 2)  Administration Flu vaccine - MCR [G0008]

## 2010-12-01 NOTE — Miscellaneous (Signed)
Summary: Immunization Entry   Immunization History:  Hepatitis B Immunization History:    Hepatitis B # 1:  historical (09/07/2006)    Hepatitis B # 2:  historical (11/18/2006)    Hepatitis B # 3:  hepb adult (03/22/2007)  Pneumovax Immunization History:    Pneumovax:  historical (09/07/2006)

## 2010-12-01 NOTE — Progress Notes (Signed)
Summary: Triage call  Phone Note Call from Patient   Caller: Patient Call For: [phone] Summary of Call: Last night patient experience weakness and sycope.  Bp had dropped to 60/44.  Patient waited awhile and took it again.  It came up a little.  Awaken this morning and her bp was 150/90.  Then took it again a little later and it was down to 107/70.  Patient is concerned and need to be seen regarding her bp levels and meds. Initial call taken by: Abundio Miu,  August 26, 2010 10:28 AM  Follow-up for Phone Call        Pt still feels a little light headed and will check her pressure again in a bit.  Given that she is taking so many BP meds I asked her to come in the afternoon to be seen.  I was afraid she may have addidtional episodes.  Pt agreeable. Follow-up by: Dennison Nancy RN,  August 26, 2010 11:02 AM

## 2010-12-01 NOTE — Assessment & Plan Note (Signed)
Summary: rash under arm,df   Vital Signs:  Patient profile:   55 year old female Height:      64.75 inches Weight:      300 pounds BMI:     50.49 Temp:     98.0 degrees F oral Pulse rate:   72 / minute BP sitting:   133 / 87  (left arm) Cuff size:   large  Vitals Entered By: Tessie Fass, CMA CC: rash under left arm x 2 months Is Patient Diabetic? Yes Pain Assessment Patient in pain? yes     Location: lower back, feet Intensity: 5   Primary Care Provider:  Ancil Boozer  MD  CC:  rash under left arm x 2 months.  History of Present Illness: Itching rash under left arm, has a few bumps.  History of abcess in that area.  Habits & Providers  Alcohol-Tobacco-Diet     Tobacco Status: never  Allergies: 1)  ! Acetyl Salicylic Acid (Aspirin) 2)  ! Nsaids  Physical Exam  General:  alert, morbidly obese Skin:  few dry papules, no signs of infection   Impression & Recommendations:  Problem # 1:  XERODERMA (ICD-701.1)  lubricate with cream, hydrocortisone 2.5% two times a day for 10 days then as needed.  Orders: [facility]- Est Level  2 (19147)  Complete Medication List: 1)  Accupril 40 Mg Tabs (Quinapril hcl) .... Take 1 tablet by mouth once a day 2)  Clonidine Hcl 0.2 Mg Tab (Clonidine hcl) .... Take 1 tablet by mouth twice a day 3)  Furosemide 20 Mg Tabs (Furosemide) .... Take 1 tab by mouth daily as need for swelling. 4)  Metformin Hcl 500 Mg Tabs (Metformin hcl) .... Take 1 tablet by mouth twice a day 5)  Hydrocodone-acetaminophen 5-325 Mg Tabs (Hydrocodone-acetaminophen) .Marland Kitchen.. 1 tab by mouth q 4-6 hours as needed pain. 6)  Colace 100 Mg Caps (Docusate sodium) .... Take 1 by mouth qd 7)  Ferrous Sulfate 325 (65 Fe) Mg Tabs (Ferrous sulfate) .... Take 1 by mouth tid 8)  Polyethylene Glycol 3350 Powd (Polyethylene glycol 3350) .... Dissolve 17 g in 4-8 oz of water of juice bid 9)  Nitroglycerin 0.4 Mg Subl (Nitroglycerin) .Marland Kitchen.. 1 tab sublingual q 5 minutes as needed  chest pain.  if 3 doses does not relieve pain, call 911 10)  Spironolactone 50 Mg Tabs (Spironolactone) .Marland Kitchen.. 1 by mouth once daily for blood pressure 11)  Ambien 10 Mg Tabs (Zolpidem tartrate) .... 1/2 - 1 by mouth at bedtime as needed insomnia 12)  Centrum Silver Tabs (Multiple vitamins-minerals) 13)  B Complex Tabs (B complex vitamins) 14)  Oyst-cal D 250-125 Mg-unit Tabs (Calcium carbonate-vitamin d) 15)  Hydralazine Hcl 25 Mg Tabs (Hydralazine hcl) .Marland Kitchen.. 1 by mouth three times a day for blood pressure 16)  Carvedilol 25 Mg Tabs (Carvedilol) .Marland Kitchen.. 1 by mouth two times a day for blood pressure.  replaces cardiazem and metoprolol. 17)  Nexium 40 Mg Cpdr (Esomeprazole magnesium) .... Take 1 tab each morning 18)  Hydrocortisone 2.5 % Oint (Hydrocortisone) .... Apply to rash two times a day for 10 day then as needed, 60 gm  Patient Instructions: 1)  Use mild soap 2)  Use cream two times a day for 10 days then prn Prescriptions: HYDROCORTISONE 2.5 % OINT (HYDROCORTISONE) apply to rash two times a day for 10 day then as needed, 60 GM  #1 x 1   Entered and Authorized by:   Luretha Murphy NP   Signed  by:   Luretha Murphy NP on 11/14/2009   Method used:   Electronically to        [facility] [phone]* (retail)       [address]       [place], [place]  [address]       Ph: [phone]       Fax: [phone]   RxID:   [id number]

## 2010-12-01 NOTE — Assessment & Plan Note (Signed)
Summary: BP check/ls  Nurse Visit   Vital Signs:  Patient profile:   55 year old female Pulse (ortho):   80 / minute BP standing:   109 / 80  Serial Vital Signs/Assessments:  Time      Position  BP       Pulse  Resp  Temp     By 4:00 PM   Lying LA  98/80    72                    Hannah Lo RN 4:00 PM   Sitting   118/80   76                    Hannah Lo RN 4:00 PM   Standing  109/80   80                    Hannah Lo RN 4:15 PM   Lying RA  324/40   75                    Hannah Lo RN 4:15 PM   Sitting   102/68   73                    Hannah Lo RN 4:15 PM   Standing  101/68   76                    Hannah Lo RN  above BP checked manually in LA and with dynamap on right arm.  Allergies: 1)  ! Acetyl Salicylic Acid (Aspirin) 2)  ! Nsaids  Orders Added: 1)  No Charge Patient Arrived (NCPA0) [NCPA0]  patient does report some dizziness with changing positions. Consulted with Dr. McDiarmid and he advises to stop accupril . follow up with [facility] doctor in one week. appointment scheduled. Hannah Lo RN  October 07, 2010 5:18 PM   Vital Signs:  Patient profile:   55 year old female Pulse (ortho):   80 / minute BP standing:   109 / 80

## 2010-12-01 NOTE — Assessment & Plan Note (Signed)
Summary: f/u bp/[facility] pt/eo   Vital Signs:  Patient profile:   55 year old female Height:      64.75 inches Weight:      302.8 pounds Temp:     97.7 degrees F oral Pulse rate:   75 / minute Pulse (ortho):   81 / minute BP sitting:   130 / 85  (left arm) BP standing:   120 / 82 Cuff size:   large  Vitals Entered By: Garen Grams LPN (August 28, 2010 2:37 PM) CC: f/u bp Is Patient Diabetic? Yes Did you bring your meter with you today? No Pain Assessment Patient in pain? no        Serial Vital Signs/Assessments:  Time      Position  BP       Pulse  Resp  Temp     By 2:40 PM   Lying LA  148/89   76                    Asha Benton LPN 4:78 PM   Sitting   130/85   75                    Asha Benton LPN 2:95 PM   Standing  120/82   81                    Asha Benton LPN   Primary Provider:  Alvia Grove DO  CC:  f/u bp.  History of Present Illness: HTN- Pt does not check BP at home, denies headache, vision changes. Is taking her medicines.   Dizzyness/Orthostatic BP Improved.  She has been taking 1/2 of the Accupril as Dr. Earlene Plater suggested, and has not had dizzyness when standing up again.  She says Dr. Earlene Plater said she was to take 1/2 of another BP medicine too, but forgot which one.   Kidney Cyst- Pt. was previously unaware of an incidental finding of a Kidney cyst seen on CT scan in 2008.  Says she just wants to know what it means and if it could be cancer or she could have kidney problems because of it  Preventive Screening-Counseling & Management  Alcohol-Tobacco     Smoking Status: never     Year Quit: ?  Allergies: 1)  ! Acetyl Salicylic Acid (Aspirin) 2)  ! Nsaids  Review of Systems       Negative except for stated in HPI.   Physical Exam  General:  Well-developed,well-nourished,in no acute distress; alert,appropriate and cooperative throughout examination Eyes:  No corneal or conjunctival inflammation noted. EOMI. Perrla. Mouth:  Oral mucosa and  oropharynx without lesions or exudates.  Teeth in good repair. Neck:  No deformities, masses, or tenderness noted. Lungs:  Normal respiratory effort, chest expands symmetrically. Lungs are clear to auscultation, no crackles or wheezes. Heart:  Normal rate and regular rhythm. S1 and S2 normal without gallop, murmur, click, rub or other extra sounds. Abdomen:  Bowel sounds positive,abdomen soft and non-tender without masses, organomegaly or hernias noted. Pulses:  R and L carotid,radial,dorsalis pedis and posterior tibial pulses are full and equal bilaterally Extremities:  trace edema in lower legs.    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  Continue 1/2 of the previous dose of Accpril.  Will not change clonidine as pt's BP has been difficult to control previously.  Orders: [facility]- Est  Level 4 (99214)  Problem # 2:  HYPERTENSION, UNCONTROLLED (ICD-401.9) Well controlled, Accupril is now 20 mg daily.  Will continue others as previously perscribed.  Her updated medication list for this problem includes:    Accupril 40 Mg Tabs (Quinapril hcl) .Marland Kitchen... Take 1/2 tablet by mouth once a day    Clonidine Hcl 0.2 Mg Tab (Clonidine hcl) .Marland Kitchen... Take 1 tablet by mouth twice a day    Furosemide 20 Mg Tabs (Furosemide) .Marland Kitchen... Take 1 tab by mouth daily as need for swelling.    Spironolactone 50 Mg Tabs (Spironolactone) .Marland Kitchen... 1 by mouth once daily for blood pressure    Hydralazine Hcl 25 Mg Tabs (Hydralazine hcl) .Marland Kitchen... 1 by mouth three times a day for blood pressure    Carvedilol 25 Mg Tabs (Carvedilol) .Marland Kitchen... 1 by mouth two times a day for blood pressure.  replaces cardiazem and metoprolol.    Diltiazem Hcl 30 Mg Tabs (Diltiazem hcl) .Marland Kitchen..Marland Kitchen Two times a day per cards  Orders: [facility]- Est  Level 4 (62130)  Problem # 3:  DIZZINESS (ICD-780.4) Improved, continue medications.  Her updated medication list for  this problem includes:    Fexofenadine Hcl 180 Mg Tabs (Fexofenadine hcl) ..... 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  No follow up is reccomended for this benign cyst.   Orders: [facility]- Est  Level 4 (46962)  Complete Medication List: 1)  Accupril 40 Mg Tabs (Quinapril hcl) .... Take 1/2 tablet by mouth once a day 2)  Clonidine Hcl 0.2 Mg Tab (Clonidine hcl) .... Take 1 tablet by mouth twice a day 3)  Furosemide 20 Mg Tabs (Furosemide) .... Take 1 tab by mouth daily as need for swelling. 4)  Metformin Hcl 500 Mg Tabs (Metformin hcl) .... Take 1 tablet by mouth twice a day 5)  Tylenol Arthritis Pain 650 Mg Cr-tabs (Acetaminophen) .Marland Kitchen.. 1 by mouth two times a day to three times a day as needed pain 6)  Colace 100 Mg Caps (Docusate sodium) .... Take 1 by mouth qd 7)  Ferrous Sulfate 325 (65 Fe) Mg Tabs (Ferrous sulfate) .... Take 1 by mouth tid 8)  Polyethylene Glycol 3350 Powd (Polyethylene glycol 3350) .... Dissolve 17 g in 4-8 oz of water of juice bid 9)  Nitroglycerin 0.4 Mg Subl (Nitroglycerin) .Marland Kitchen.. 1 tab sublingual q 5 minutes as needed chest pain.  if 3 doses does not relieve pain, call 911 10)  Spironolactone 50 Mg Tabs (Spironolactone) .Marland Kitchen.. 1 by mouth once daily for blood pressure 11)  Ambien 10 Mg Tabs (Zolpidem tartrate) .... 1/2 - 1 by mouth at bedtime as needed insomnia 12)  Centrum Silver Tabs (Multiple vitamins-minerals) 13)  B Complex Tabs (B complex vitamins) 14)  Oyst-cal D 250-125 Mg-unit Tabs (Calcium carbonate-vitamin d) 15)  Hydralazine Hcl 25 Mg Tabs (Hydralazine hcl) .Marland Kitchen.. 1 by mouth three times a day for blood pressure 16)  Carvedilol 25 Mg Tabs (Carvedilol) .Marland Kitchen.. 1 by mouth two times a day for blood pressure.  replaces cardiazem and metoprolol. 17)  Nexium 40 Mg Cpdr (Esomeprazole magnesium) .... Take 1  tab each morning 18)  Hydrocortisone 2.5 % Oint (Hydrocortisone) .... Apply to rash two times a day for 10 day then as needed, 60 gm 19)  Isosorbide Mononitrate Cr 60 Mg Xr24h-tab (  Isosorbide mononitrate) .... Once daily per cards 20)  Diltiazem Hcl 30 Mg Tabs (Diltiazem hcl) .... Two times a day per cards 21)  Fexofenadine Hcl 180 Mg Tabs (Fexofenadine hcl) .... Sig take 1 tab by mouth one time daily disp 30 22)  Flonase 50 Mcg/act Susp (Fluticasone propionate) .... Sig: 2 sprays/nostril one time daily in the morning after nasal rinse disp 1 unit  Patient Instructions: 1)  It was nice to meet you today.  I want you to continue taking 1/2 of your old accupril dose from now on (used to be 40 mg, now 20 mg).  Since you are feeling better with only this change, keep taking the clonidine as you have been before (0.2 mg daily). 2)  You have a small, simple cyst on your left kidney.  It is basically a fluid-filled pocket.  It is not cancer, and it will not harm your kidney.   3)  Please make an appointment to with Dr. Gomez Cleverly in about 3 months.    Orders Added: 1)  [facility]- Est  Level 4 [91478]

## 2010-12-01 NOTE — Assessment & Plan Note (Signed)
Summary: tb test,df  Nurse Visit   Allergies: 1)  ! Acetyl Salicylic Acid (Aspirin) 2)  ! Nsaids  Immunizations Administered:  PPD Skin Test:    Vaccine Type: PPD    Site: right forearm    Mfr: San[employer]i Pasteur    Dose: 0.1 ml    Route: ID    Given by: Theresia Lo RN    Exp. Date: 09/03/2011    Lot #: [id number]  Orders Added: 1)  TB Skin Test [86580] 2)  Admin 1st Vaccine [[id number]]

## 2010-12-01 NOTE — Consult Note (Signed)
Summary: [facility]  [facility]   Imported By: Clydell Hakim 01/01/2010 11:05:27  _____________________________________________________________________  External Attachment:    Type:   Image     Comment:   External Document  Appended Document: [facility]     Diabetic Eye Exam  Procedure date:  01/05/2010  Findings:      No diabetic retinopathy.     Allergies: 1)  ! Acetyl Salicylic Acid (Aspirin) 2)  ! Nsaids   Complete Medication List: 1)  Accupril 40 Mg Tabs (Quinapril hcl) .... Take 1 tablet by mouth once a day 2)  Clonidine Hcl 0.2 Mg Tab (Clonidine hcl) .... Take 1 tablet by mouth twice a day 3)  Furosemide 20 Mg Tabs (Furosemide) .... Take 1 tab by mouth daily as need for swelling. 4)  Metformin Hcl 500 Mg Tabs (Metformin hcl) .... Take 1 tablet by mouth twice a day 5)  Hydrocodone-acetaminophen 5-325 Mg Tabs (Hydrocodone-acetaminophen) .Marland Kitchen.. 1 tab by mouth q 4-6 hours as needed pain. 6)  Colace 100 Mg Caps (Docusate sodium) .... Take 1 by mouth qd 7)  Ferrous Sulfate 325 (65 Fe) Mg Tabs (Ferrous sulfate) .... Take 1 by mouth tid 8)  Polyethylene Glycol 3350 Powd (Polyethylene glycol 3350) .... Dissolve 17 g in 4-8 oz of water of juice bid 9)  Nitroglycerin 0.4 Mg Subl (Nitroglycerin) .Marland Kitchen.. 1 tab sublingual q 5 minutes as needed chest pain.  if 3 doses does not relieve pain, call 911 10)  Spironolactone 50 Mg Tabs (Spironolactone) .Marland Kitchen.. 1 by mouth once daily for blood pressure 11)  Ambien 10 Mg Tabs (Zolpidem tartrate) .... 1/2 - 1 by mouth at bedtime as needed insomnia 12)  Centrum Silver Tabs (Multiple vitamins-minerals) 13)  B Complex Tabs (B complex vitamins) 14)  Oyst-cal D 250-125 Mg-unit Tabs (Calcium carbonate-vitamin d) 15)  Hydralazine Hcl 25 Mg Tabs (Hydralazine hcl) .Marland Kitchen.. 1 by mouth three times a day for blood pressure 16)  Carvedilol 25 Mg Tabs (Carvedilol) .Marland Kitchen.. 1 by mouth two times a day for blood pressure.  replaces cardiazem  and metoprolol. 17)  Nexium 40 Mg Cpdr (Esomeprazole magnesium) .... Take 1 tab each morning 18)  Hydrocortisone 2.5 % Oint (Hydrocortisone) .... Apply to rash two times a day for 10 day then as needed, 60 gm   Prevention & Chronic Care Immunizations   Influenza vaccine: Fluvax Non-MCR  (08/18/2009)   Influenza vaccine deferral: Not available  (06/13/2009)   Influenza vaccine due: 08/06/2009    Tetanus booster: 01/31/2004: Done.   Tetanus booster due: 01/30/2014    Pneumococcal vaccine: Not documented  Colorectal Screening   Hemoccult: Not documented   Hemoccult due: Not Indicated    Colonoscopy: Done.  (03/01/2006)   Colonoscopy due: 03/01/2016  Other Screening   Pap smear: Done.  (01/31/2004)   Pap smear due: Not Indicated    Mammogram: Normal  (09/26/2009)   Mammogram due: 10/2010   Smoking status: never  (11/14/2009)  Diabetes Mellitus   HgbA1C: 6.0  (10/16/2009)   Hemoglobin A1C due: 10/09/2008    Eye exam: No diabetic retinopathy.     (01/05/2010)    Foot exam: Not documented   High risk foot: Not documented   Foot care education: Not documented    Urine microalbumin/creatinine ratio: Not documented   Urine microalbumin action/deferral: Not indicated  Lipids   Total Cholesterol: 169  (02/13/2008)   LDL: 93  (02/13/2008)   LDL Direct: 86  (06/13/2009)   HDL: 64  (02/13/2008)   Triglycerides:  61  (02/13/2008)  Hypertension   Last Blood Pressure: 133 / 87  (11/14/2009)   Serum creatinine: 0.71  (08/18/2009)   Serum potassium 4.2  (08/18/2009)  Self-Management Support :   Personal Goals (by the next clinic visit) :     Personal A1C goal: 8  (07/24/2009)     Personal blood pressure goal: 130/80  (07/24/2009)     Personal LDL goal: 100  (06/13/2009)    Diabetes self-management support: Not documented    Diabetes self-management support not done because: Good outcomes  (08/12/2009)    Hypertension self-management support: BP self-monitoring log   (07/24/2009)    Hypertension self-management support not done because: Good outcomes  (10/16/2009)

## 2010-12-01 NOTE — Assessment & Plan Note (Signed)
Summary: ER follow up/ls, already scheduled before it was cancelled   Vital Signs:  Patient profile:   55 year old female Height:      64.75 inches Weight:      300 pounds BMI:     50.49 BSA:     2.35 Temp:     98.4 degrees F Pulse rate:   79 / minute BP sitting:   135 / 81  Vitals Entered By: Jone Baseman CMA (July 21, 2010 9:56 AM) CC: ED f/u Is Patient Diabetic? No Pain Assessment Patient in pain? yes     Location: right side of neck Intensity: 4   Primary Care Provider:  Alvia Grove DO  CC:  ED f/u.  History of Present Illness: 55 yo female here for hospital follow up.  Went to ED on 07-10-10 due to neck pain.  Review ED report and it showed dx of muscular strain.  Pt denies any trauma or injury causing pain, just woke up the morning of the 9th and felt pain on the right side of her neck. Pain worsened thruout the day and started to radiate down the right side of her face, at this point she presented to the ED for further evaluation. She was given Valium for muscle spasms and advised to f/u with me for further treatment if needed.  Pt is doing much better today.  She states her pain is almost completely resolved.  Has been using heat and resting the right side of her neck.  Taking Valium sparingly and only at night with some relief. No re-occuring radiation of pain, no numbness or tingling.  No change in sensation to face or neck.   Habits & Providers  Alcohol-Tobacco-Diet     Tobacco Status: never     Year Quit: ?  Current Problems (verified): 1)  Neck Sprain and Strain  (ICD-847.0) 2)  Screening, Pulmonary Tuberculosis  (ICD-V74.1) 3)  Allergic Rhinitis Cause Unspecified  (ICD-477.9) 4)  Diab W/oth Manifests Type Ii/uns Not Uncntrl  (ICD-250.80) 5)  Hypertension, Uncontrolled  (ICD-401.9) 6)  Cerebrovascular Accident, Hx of  (ICD-V12.50) 7)  Venous Insufficiency, Chronic  (ICD-459.81) 8)  Chest Pain, Atypical  (ICD-786.59) 9)  Diverticulosis, Colon   (ICD-562.10) 10)  Gastrointestinal Hemorrhage, Hx of  (ICD-V12.79) 11)  Anemia, Iron Deficiency, Unspec.  (ICD-280.9) 12)  Obesity, Nos  (ICD-278.00) 13)  Foot Pain, Bilateral  (ICD-729.5) 14)  Postmenopausal Status  (ICD-V49.81) 15)  Libido, Decreased  (ICD-799.81) 16)  Cyst, Kidney, Acquired  (ICD-593.2)  Current Medications (verified): 1)  Accupril 40 Mg Tabs (Quinapril Hcl) .... Take 1 Tablet By Mouth Once A Day 2)  Clonidine Hcl 0.2 Mg Tab (Clonidine Hcl) .... Take 1 Tablet By Mouth Twice A Day 3)  Furosemide 20 Mg Tabs (Furosemide) .... Take 1 Tab By Mouth Daily As Need For Swelling. 4)  Metformin Hcl 500 Mg Tabs (Metformin Hcl) .... Take 1 Tablet By Mouth Twice A Day 5)  Tylenol Arthritis Pain 650 Mg Cr-Tabs (Acetaminophen) .Marland Kitchen.. 1 By Mouth Two Times A Day To Three Times A Day As Needed Pain 6)  Colace 100 Mg  Caps (Docusate Sodium) .... Take 1 By Mouth Qd 7)  Ferrous Sulfate 325 (65 Fe) Mg  Tabs (Ferrous Sulfate) .... Take 1 By Mouth Tid 8)  Polyethylene Glycol 3350  Powd (Polyethylene Glycol 3350) .... Dissolve 17 G in 4-8 Oz of Water of Juice Bid 9)  Nitroglycerin 0.4 Mg Subl (Nitroglycerin) .Marland Kitchen.. 1 Tab Sublingual Q 5 Minutes As Needed  Chest Pain.  If 3 Doses Does Not Relieve Pain, Call 911 10)  Spironolactone 50 Mg Tabs (Spironolactone) .Marland Kitchen.. 1 By Mouth Once Daily For Blood Pressure 11)  Ambien 10 Mg Tabs (Zolpidem Tartrate) .... 1/2 - 1 By Mouth At Bedtime As Needed Insomnia 12)  Centrum Silver  Tabs (Multiple Vitamins-Minerals) 13)  B Complex  Tabs (B Complex Vitamins) 14)  Oyst-Cal D 250-125 Mg-Unit Tabs (Calcium Carbonate-Vitamin D) 15)  Hydralazine Hcl 25 Mg Tabs (Hydralazine Hcl) .Marland Kitchen.. 1 By Mouth Three Times A Day For Blood Pressure 16)  Carvedilol 25 Mg Tabs (Carvedilol) .Marland Kitchen.. 1 By Mouth Two Times A Day For Blood Pressure.  Replaces Cardiazem and Metoprolol. 17)  Nexium 40 Mg Cpdr (Esomeprazole Magnesium) .... Take 1 Tab Each Morning 18)  Hydrocortisone 2.5 % Oint  (Hydrocortisone) .... Apply To Rash Two Times A Day For 10 Day Then As Needed, 60 Gm 19)  Isosorbide Mononitrate Cr 60 Mg Xr24h-Tab (Isosorbide Mononitrate) .... Once Daily Per Cards 20)  Diltiazem Hcl 30 Mg Tabs (Diltiazem Hcl) .... Two Times A Day Per Cards 21)  Fexofenadine Hcl 180 Mg Tabs (Fexofenadine Hcl) .... Sig Take 1 Tab By Mouth One Time Daily Disp 30 22)  Flonase 50 Mcg/act Susp (Fluticasone Propionate) .... Sig: 2 Sprays/nostril One Time Daily in The Morning After Nasal Rinse Disp 1 Unit 23)  Soma 350 Mg Tabs (Carisoprodol) .... Take 1 Pill By Mouth Three Times A Day Prn  Allergies (verified): 1)  ! Acetyl Salicylic Acid (Aspirin) 2)  ! Nsaids  Past History:  Past Medical History: Last updated: 04/09/2010 *******ASA/ NSAIDS CONTRAINDICATED ( recurrent GI bleed, see below) -- PANDIVERTICULOSIS ( Colonoscopy Dr. Arlyce Dice in 2007) --Gastrointestinal hemorrhage:   1- hx of  GI  bleed when on NSAIDS (rectal bleeding x2  while on  tx  for knee                                                                     pain in 2002)                                                    2-- -GI BLEED RECURRENT  x4 :  2 to colon diverticula (11/03/05 x2).                                                                                F/u with Surgery: Dr. 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  -creatinine  0.7 10/06 ( 2+ microalbumin 10/06) POSTMENOPAUSAL STATUS (ICD-V49.81) VENOUS INSUFFICIENCY, CHRONIC (ICD-459.81)  Past Surgical History: Last updated: 09/15/2008 -Abd. 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 -Colonoscopy (difuse diverticulosis) - 03/01/2006 Dr. Arlyce Dice -EGD: normal ( 03/2006) - CT - Head 10/06 (L putamen infarct) - 10/28/2005 - Echo EF: 60 % mild/mod LVH - 08/19/2005, - exercise cardiolite(EF 67%/nl perfusion) - 10/01/2005 - hysterectomy - complete   95 - 07/21/2004 - knee replacement bilat. 98 - 07/21/2004  Family History: Last updated: 04/09/2010 Colon CA 1st degree, Diabetes 1st degree, hypertension, stroke HTN ( brother) DM (brother) Mom died at 77 yo 2 to DM. Father died 2 to colon CA in  1980's. mental retardation in sisters  Social History: Last updated: 04/09/2010 Awaiting for disability. Lives with 2  MR sisters ( she takes care of them by her self - one is patient here - Nasra Counce )   Quit smoking THC in 2000  Currently no tobacco abuse. Quit in  1999 (smoked everyday during 22 years). Used  to go to [employer] for weight loss. Currently doing water aerobics or going to [employer]  to lose weight.  5 Brothers  (1948) , 1949 living in [place], [place] in [place], [place] in [place] in [place].  3 sisters 8, 49 ( both MR) now living with her , and 32 in [place]  Risk Factors: Smoking Status: never (07/21/2010)  Social History: Reviewed history from 04/09/2010 and no changes required. Awaiting for disability. Lives with 2  MR sisters ( she takes care of them by her self - one is patient here - Briceyda Abdullah )   Quit smoking THC in 2000  Currently no tobacco abuse. Quit in  1999 (smoked everyday during 22 years). Used  to go to [employer] for weight loss. Currently doing water aerobics or going to [employer]  to lose weight.  5 Brothers  (1948) , 1949 living in [place], [place] in [place], [place] in [place] in [place].  3 sisters 12, 45 ( both  MR) now living with her , and 71 in [place]  Review of Systems  The patient denies chest pain, syncope, dyspnea on exertion, peripheral edema, and headaches.    Physical Exam  General:  vs reviewed, alert, well-developed, well-nourished, and well-hydrated.   Lungs:  Normal respiratory effort, chest expands symmetrically. Lungs are clear to auscultation, no crackles or wheezes. Heart:  Normal rate and regular rhythm. S1 and S2 normal without gallop, murmur, click, rub or other extra sounds. Extremities:  tr edema Neurologic:  alert & oriented X3, cranial nerves II-XII intact, strength normal in all extremities, sensation intact to light touch, sensation intact to pinprick, gait normal, DTRs symmetrical and normal, and finger-to-nose normal.   Skin:  turgor normal, color normal, and no rashes.   Psych:  Oriented X3, memory intact for recent and remote, normally interactive, and good eye contact.     Impression & Recommendations:  Problem # 1:  NECK SPRAIN AND STRAIN (ICD-847.0) Assessment Improved continues to improve.   see pt instructions Her updated medication list for this problem includes:    Tylenol Arthritis Pain 650 Mg Cr-tabs (Acetaminophen) .Marland Kitchen... 1 by mouth two  times a day to three times a day as needed pain    Soma 350 Mg Tabs (Carisoprodol) .Marland Kitchen... Take 1 pill by mouth three times a day prn  Orders: [facility]- Est Level  3 (60454)  Problem # 2:  HYPERTENSION, UNCONTROLLED (ICD-401.9) stable.  continue current meds Her updated medication list for this problem includes:    Accupril 40 Mg Tabs (Quinapril hcl) .Marland Kitchen... Take 1 tablet by mouth once a day    Clonidine Hcl 0.2 Mg Tab (Clonidine hcl) .Marland Kitchen... Take 1 tablet by mouth twice a day    Furosemide 20 Mg Tabs (Furosemide) .Marland Kitchen... Take 1 tab by mouth daily as need for swelling.    Spironolactone 50 Mg Tabs (Spironolactone) .Marland Kitchen... 1 by mouth once daily for blood pressure    Hydralazine Hcl 25 Mg Tabs (Hydralazine hcl) .Marland Kitchen... 1 by mouth three  times a day for blood pressure    Carvedilol 25 Mg Tabs (Carvedilol) .Marland Kitchen... 1 by mouth two times a day for blood pressure.  replaces cardiazem and metoprolol.    Diltiazem Hcl 30 Mg Tabs (Diltiazem hcl) .Marland Kitchen..Marland Kitchen Two times a day per cards  Complete Medication List: 1)  Accupril 40 Mg Tabs (Quinapril hcl) .... Take 1 tablet by mouth once a day 2)  Clonidine Hcl 0.2 Mg Tab (Clonidine hcl) .... Take 1 tablet by mouth twice a day 3)  Furosemide 20 Mg Tabs (Furosemide) .... Take 1 tab by mouth daily as need for swelling. 4)  Metformin Hcl 500 Mg Tabs (Metformin hcl) .... Take 1 tablet by mouth twice a day 5)  Tylenol Arthritis Pain 650 Mg Cr-tabs (Acetaminophen) .Marland Kitchen.. 1 by mouth two times a day to three times a day as needed pain 6)  Colace 100 Mg Caps (Docusate sodium) .... Take 1 by mouth qd 7)  Ferrous Sulfate 325 (65 Fe) Mg Tabs (Ferrous sulfate) .... Take 1 by mouth tid 8)  Polyethylene Glycol 3350 Powd (Polyethylene glycol 3350) .... Dissolve 17 g in 4-8 oz of water of juice bid 9)  Nitroglycerin 0.4 Mg Subl (Nitroglycerin) .Marland Kitchen.. 1 tab sublingual q 5 minutes as needed chest pain.  if 3 doses does not relieve pain, call 911 10)  Spironolactone 50 Mg Tabs (Spironolactone) .Marland Kitchen.. 1 by mouth once daily for blood pressure 11)  Ambien 10 Mg Tabs (Zolpidem tartrate) .... 1/2 - 1 by mouth at bedtime as needed insomnia 12)  Centrum Silver Tabs (Multiple vitamins-minerals) 13)  B Complex Tabs (B complex vitamins) 14)  Oyst-cal D 250-125 Mg-unit Tabs (Calcium carbonate-vitamin d) 15)  Hydralazine Hcl 25 Mg Tabs (Hydralazine hcl) .Marland Kitchen.. 1 by mouth three times a day for blood pressure 16)  Carvedilol 25 Mg Tabs (Carvedilol) .Marland Kitchen.. 1 by mouth two times a day for blood pressure.  replaces cardiazem and metoprolol. 17)  Nexium 40 Mg Cpdr (Esomeprazole magnesium) .... Take 1 tab each morning 18)  Hydrocortisone 2.5 % Oint (Hydrocortisone) .... Apply to rash two times a day for 10 day then as needed, 60 gm 19)  Isosorbide  Mononitrate Cr 60 Mg Xr24h-tab (Isosorbide mononitrate) .... Once daily per cards 20)  Diltiazem Hcl 30 Mg Tabs (Diltiazem hcl) .... Two times a day per cards 21)  Fexofenadine Hcl 180 Mg Tabs (Fexofenadine hcl) .... Sig take 1 tab by mouth one time daily disp 30 22)  Flonase 50 Mcg/act Susp (Fluticasone propionate) .... Sig: 2 sprays/nostril one time daily in the morning after nasal rinse disp 1 unit 23)  Soma 350 Mg Tabs (Carisoprodol) .Marland KitchenMarland KitchenMarland Kitchen  Take 1 pill by mouth three times a day prn  Patient Instructions: 1)  Nice to see you today! 2)  I am giving you a script for Soma, this will help with your muscle spasms.  Make sure you try it at night first and make sure it doesn't make you sleepy.  If it does do NOT take it during the day.  3)  I'm glad your neck is getting better, continue resting it and using heat as needed 4)  Please schedule a follow-up appointment in 3 months or sooner if needed.  Prescriptions: SOMA 350 MG TABS (CARISOPRODOL) take 1 pill by mouth three times a day PRN  #90 x 0   Entered and Authorized by:   Alvia Grove DO   Signed by:   Alvia Grove DO on 07/23/2010   Method used:   Handwritten   RxID:   [id number]

## 2010-12-01 NOTE — Assessment & Plan Note (Signed)
Summary: f/up,tcb   Vital Signs:  Patient profile:   55 year old female Height:      64.75 inches Weight:      298 pounds BMI:     50.15 Temp:     98.5 degrees F oral Pulse rate:   86 / minute BP sitting:   136 / 83  (left arm) Cuff size:   large  Vitals Entered By: Garen Grams LPN (April 07, 6212 8:57 AM) CC: f/u DM, HTN, discuss foot pain Is Patient Diabetic? Yes Did you bring your meter with you today? No Pain Assessment Patient in pain? no        Primary Care Provider:  Ancil Boozer  MD  CC:  f/u DM, HTN, and discuss foot pain.  History of Present Illness: DM: overall doing well.  no side effects from meds.  no hypoglycemia.  typically AM sugars are in the 90-100 range.  typically after eating 130s after eating.   HTN: still having some lower extremity edema at times for which she takes as needed diuretics.  reports blood pressures which she has been monitoring at home have been much better on current regimen (typically 110s-120s/60s-80s).  highest she has seen recently was 140/100.  denies dizziness, denies headaches, denies chest pains.   foot pain: chronic but has perhaps worsened a little recnetly.  primarily in right foot just behind toes.  hasn't tried anything to help.  no swelling in the foot or redness or warmth.    Habits & Providers  Alcohol-Tobacco-Diet     Tobacco Status: never  Current Medications (verified): 1)  Accupril 40 Mg Tabs (Quinapril Hcl) .... Take 1 Tablet By Mouth Once A Day 2)  Clonidine Hcl 0.2 Mg Tab (Clonidine Hcl) .... Take 1 Tablet By Mouth Twice A Day 3)  Furosemide 20 Mg Tabs (Furosemide) .... Take 1 Tab By Mouth Daily As Need For Swelling. 4)  Metformin Hcl 500 Mg Tabs (Metformin Hcl) .... Take 1 Tablet By Mouth Twice A Day 5)  Tylenol Arthritis Pain 650 Mg Cr-Tabs (Acetaminophen) .Marland Kitchen.. 1 By Mouth Two Times A Day To Three Times A Day As Needed Pain 6)  Colace 100 Mg  Caps (Docusate Sodium) .... Take 1 By Mouth Qd 7)  Ferrous  Sulfate 325 (65 Fe) Mg  Tabs (Ferrous Sulfate) .... Take 1 By Mouth Tid 8)  Polyethylene Glycol 3350  Powd (Polyethylene Glycol 3350) .... Dissolve 17 G in 4-8 Oz of Water of Juice Bid 9)  Nitroglycerin 0.4 Mg Subl (Nitroglycerin) .Marland Kitchen.. 1 Tab Sublingual Q 5 Minutes As Needed Chest Pain.  If 3 Doses Does Not Relieve Pain, Call 911 10)  Spironolactone 50 Mg Tabs (Spironolactone) .Marland Kitchen.. 1 By Mouth Once Daily For Blood Pressure 11)  Ambien 10 Mg Tabs (Zolpidem Tartrate) .... 1/2 - 1 By Mouth At Bedtime As Needed Insomnia 12)  Centrum Silver  Tabs (Multiple Vitamins-Minerals) 13)  B Complex  Tabs (B Complex Vitamins) 14)  Oyst-Cal D 250-125 Mg-Unit Tabs (Calcium Carbonate-Vitamin D) 15)  Hydralazine Hcl 25 Mg Tabs (Hydralazine Hcl) .Marland Kitchen.. 1 By Mouth Three Times A Day For Blood Pressure 16)  Carvedilol 25 Mg Tabs (Carvedilol) .Marland Kitchen.. 1 By Mouth Two Times A Day For Blood Pressure.  Replaces Cardiazem and Metoprolol. 17)  Nexium 40 Mg Cpdr (Esomeprazole Magnesium) .... Take 1 Tab Each Morning 18)  Hydrocortisone 2.5 % Oint (Hydrocortisone) .... Apply To Rash Two Times A Day For 10 Day Then As Needed, 60 Gm 19)  Isosorbide  Mononitrate Cr 60 Mg Xr24h-Tab (Isosorbide Mononitrate) .... Once Daily Per Cards 20)  Diltiazem Hcl 30 Mg Tabs (Diltiazem Hcl) .... Two Times A Day Per Cards  Allergies (verified): 1)  ! Acetyl Salicylic Acid (Aspirin) 2)  ! Nsaids  Past History:  Past medical, surgical, family and social histories (including risk factors) reviewed for relevance to current acute and chronic problems.  Past Medical History: Reviewed history from 07/24/2009 and no changes required. *******ASA/ NSAIDS CONTRAINDICATED ( recurrent GI bleed, see below) -- PANDIVERTICULOSIS ( Colonoscopy Dr. Arlyce Dice in 2007) --Gastrointestinal hemorrhage:   1- hx of  GI  bleed when on NSAIDS (rectal bleeding x2  while on  tx  for knee                                                                     pain in 2002)                                                     2-- -GI BLEED RECURRENT  x4 :  2 to colon diverticula (11/03/05 x2).                                                                                F/u with Surgery: Dr. 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  -creatinine 0.7 10/06 ( 2+ microalbumin 10/06)  Past Surgical History: Reviewed history from 09/15/2008 and no changes required. -Abd. 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 -Colonoscopy (difuse diverticulosis) - 03/01/2006 Dr. Arlyce Dice -EGD: normal ( 03/2006) - CT - Head 10/06 (L putamen infarct) - 10/28/2005 - Echo EF: 60 % mild/mod LVH - 08/19/2005, - exercise cardiolite(EF 67%/nl perfusion) - 10/01/2005 - hysterectomy - complete   95 - 07/21/2004 - knee replacement bilat. 98 - 07/21/2004  Family History: Reviewed history from 07/26/2007 and no changes required. Colon CA 1st degree, Diabetes 1st degree, hypertension, stroke HTN ( brother) DM (brother)  Social History: Reviewed history from 09/15/2008 and no changes required. Awaiting for disability. Lives with 2  MR sisters ( she takes care of them by her self )   Quit smoking marihuana in 2000  Currently no tobacco abuse. Quitted in  1999 (smoked everyday  during 22 years). Used  to go to [employer] for weight loss. Currently doing water aerobics  to loose weight. Mom died at 45 yo 2 to DM. Father died 2 to colon CA in   1980's. 5 Brothers  (1948) , 1949 living in [place], [place] in [place], [place] in [place] in [place].  3 sisters 94, 41 ( both MR) now living with her , and 57 in [place]  Review of Systems       per HPI  Physical Exam  General:  alert, morbidly obese VS reviewed Lungs:  Normal respiratory effort, chest expands symmetrically. Lungs are clear to auscultation, no crackles or wheezes. Heart:  Normal rate and regular rhythm. 1/6 SEM at LUSB non radiating to carotids or axilla.   Msk:  bilateral nearly complete collapse of both transverse and longitudinal arches of both feet.  pain under metatarsal heads of right foot.  Extremities:  tr edema   Impression & Recommendations:  Problem # 1:  DIAB W/OTH MANIFESTS TYPE II/UNS NOT UNCNTRL (ICD-250.80) Assessment Unchanged  excellent control. no changes to regimen  Her updated medication list for this problem includes:    Accupril 40 Mg Tabs (Quinapril hcl) .Marland Kitchen... Take 1 tablet by mouth once a day    Metformin Hcl 500 Mg Tabs (Metformin hcl) .Marland Kitchen... Take 1 tablet by mouth twice a day  Orders: A1C-[facility] (11914) [facility]- Est  Level 4 (78295)  Labs Reviewed: Creat: 0.77 (01/19/2010)     Last Eye Exam: No diabetic retinopathy.    (01/05/2010) Reviewed HgBA1c results: 6.0 (04/07/2010)  6.0 (01/14/2010)  Problem # 2:  HYPERTENSION, UNCONTROLLED (ICD-401.9) Assessment: Unchanged  overall much better than it has been in the past.  continue current regimen  Her updated medication list for this problem includes:    Accupril 40 Mg Tabs (Quinapril hcl) .Marland Kitchen... Take 1 tablet by mouth once a day    Clonidine Hcl 0.2 Mg Tab (Clonidine hcl) .Marland Kitchen... Take 1 tablet by mouth twice a day    Furosemide 20 Mg Tabs (Furosemide) .Marland Kitchen... Take 1 tab by mouth daily as need for swelling.    Spironolactone 50 Mg Tabs (Spironolactone) .Marland Kitchen... 1 by mouth once daily for blood pressure    Hydralazine Hcl 25 Mg Tabs (Hydralazine hcl) .Marland Kitchen... 1 by mouth three times a day for blood  pressure    Carvedilol 25 Mg Tabs (Carvedilol) .Marland Kitchen... 1 by mouth two times a day for blood pressure.  replaces cardiazem and metoprolol.    Diltiazem Hcl 30 Mg Tabs (Diltiazem hcl) .Marland Kitchen..Marland Kitchen Two times a day per cards  BP today: 136/83 Prior BP: 130/76 (02/06/2010)  Prior 10 Yr Risk Heart Disease: Not enough information (07/26/2007)  Labs Reviewed: K+: 3.8 (01/19/2010) Creat: : 0.77 (01/19/2010)   Chol: 149 (01/19/2010)   HDL: 63 (01/19/2010)   LDL: 73 (01/19/2010)   TG: 64 (01/19/2010)  Orders: [facility]- Est  Level 4 (99214)  Problem # 3:  METATARSALGIA (ICD-726.70) Assessment: New  given metatarsal cookies to try.  shown how to place in her shoes.  she will try this first and if not seeing any relief will consider calling podiatrist for appt  Orders: [facility]- Est  Level 4 (99214) Metatarsal Cookies - [facility] (A2130)  Complete Medication List: 1)  Accupril 40 Mg Tabs (Quinapril hcl) .... Take 1 tablet by mouth once a day 2)  Clonidine Hcl 0.2 Mg Tab (Clonidine hcl) .... Take 1 tablet by mouth twice a day 3)  Furosemide 20 Mg Tabs (Furosemide) .Marland KitchenMarland KitchenMarland Kitchen  Take 1 tab by mouth daily as need for swelling. 4)  Metformin Hcl 500 Mg Tabs (Metformin hcl) .... Take 1 tablet by mouth twice a day 5)  Tylenol Arthritis Pain 650 Mg Cr-tabs (Acetaminophen) .Marland Kitchen.. 1 by mouth two times a day to three times a day as needed pain 6)  Colace 100 Mg Caps (Docusate sodium) .... Take 1 by mouth qd 7)  Ferrous Sulfate 325 (65 Fe) Mg Tabs (Ferrous sulfate) .... Take 1 by mouth tid 8)  Polyethylene Glycol 3350 Powd (Polyethylene glycol 3350) .... Dissolve 17 g in 4-8 oz of water of juice bid 9)  Nitroglycerin 0.4 Mg Subl (Nitroglycerin) .Marland Kitchen.. 1 tab sublingual q 5 minutes as needed chest pain.  if 3 doses does not relieve pain, call 911 10)  Spironolactone 50 Mg Tabs (Spironolactone) .Marland Kitchen.. 1 by mouth once daily for blood pressure 11)  Ambien 10 Mg Tabs (Zolpidem tartrate) .... 1/2 - 1 by mouth at bedtime as needed insomnia 12)   Centrum Silver Tabs (Multiple vitamins-minerals) 13)  B Complex Tabs (B complex vitamins) 14)  Oyst-cal D 250-125 Mg-unit Tabs (Calcium carbonate-vitamin d) 15)  Hydralazine Hcl 25 Mg Tabs (Hydralazine hcl) .Marland Kitchen.. 1 by mouth three times a day for blood pressure 16)  Carvedilol 25 Mg Tabs (Carvedilol) .Marland Kitchen.. 1 by mouth two times a day for blood pressure.  replaces cardiazem and metoprolol. 17)  Nexium 40 Mg Cpdr (Esomeprazole magnesium) .... Take 1 tab each morning 18)  Hydrocortisone 2.5 % Oint (Hydrocortisone) .... Apply to rash two times a day for 10 day then as needed, 60 gm 19)  Isosorbide Mononitrate Cr 60 Mg Xr24h-tab (Isosorbide mononitrate) .... Once daily per cards 20)  Diltiazem Hcl 30 Mg Tabs (Diltiazem hcl) .... Two times a day per cards  Other Orders: CBC-[facility] (36644) TSH-[facility] [phone]) Vit D, 25 OH-[facility] ([address]756-43329) B12-[facility] (51884-16606)  Patient Instructions: 1)  Follow the instructions on the pads for your foot on how to place them to help your pain.  If you find they are not helping your pain after a trial for a week then I would call the foot doctor (podiatrist) at [facility] to schedule an appt [phone]). 2)  Keep up the good work with your diabetes and blood pressure. 3)  Please follow up for routine check up in 3 months. Prescriptions: DILTIAZEM HCL 30 MG TABS (DILTIAZEM HCL) two times a day per cards  #180 x 3   Entered and Authorized by:   Ancil Boozer  MD   Signed by:   Ancil Boozer  MD on 04/07/2010   Method used:   Electronically to        [facility] [phone]* (retail)       [address]       [place], [place]  [address]       Ph: [phone]       Fax: [phone]   RxID:   [id number] ISOSORBIDE MONONITRATE CR 60 MG XR24H-TAB (ISOSORBIDE MONONITRATE) once daily per cards  #90 x 3   Entered and Authorized by:   Ancil Boozer  MD   Signed by:   Ancil Boozer  MD on 04/07/2010   Method used:   Electronically to        [facility] [phone]* (retail)       [address]       [place], [place]  [address]       Ph: [phone]       Fax:  [phone]   RxID:   [id number] NEXIUM 40 MG CPDR (ESOMEPRAZOLE MAGNESIUM) Take 1 tab each morning  #90 x 3   Entered and Authorized by:   Ancil Boozer  MD   Signed by:   Ancil Boozer  MD on 04/07/2010   Method used:   Electronically to        [facility] [phone]* (retail)       [address]       [place], [place]  [address]       Ph: [phone]       Fax: [phone]   RxID:   [id number] CARVEDILOL 25 MG TABS (CARVEDILOL) 1 by mouth two times a day for blood pressure.  Replaces cardiazem and metoprolol.  #180 x 3   Entered and Authorized by:   Ancil Boozer  MD   Signed by:   Ancil Boozer  MD on 04/07/2010   Method used:   Electronically to        [facility] [phone]* (retail)       [address]       [place], [place]  [address]       Ph: [phone]       Fax: [phone]   RxID:   [id number] HYDRALAZINE HCL 25 MG TABS (HYDRALAZINE HCL) 1 by mouth three times a day for blood pressure  #270 x 3   Entered and Authorized by:   Ancil Boozer  MD   Signed by:   Ancil Boozer  MD on 04/07/2010   Method used:   Electronically to        [facility] [address] [phone]* (retail)       [address]       [place], [place]  [address]       Ph: [phone]       Fax: [phone]   RxID:   [id number] SPIRONOLACTONE 50 MG TABS (SPIRONOLACTONE) 1 by mouth once daily for blood pressure  #90 x 3   Entered and Authorized by:   Ancil Boozer  MD   Signed by:   Ancil Boozer  MD on 04/07/2010   Method used:   Electronically to        [facility] [phone]* (retail)       [address]       [place], [place]  [address]       Ph: [phone]       Fax: [phone]   RxID:   [id number] POLYETHYLENE GLYCOL 3350  POWD (POLYETHYLENE GLYCOL 3350) Dissolve 17 g in 4-8 oz of water of juice bid  #3 mo supply x 3   Entered and Authorized  by:   Ancil Boozer  MD   Signed by:   Ancil Boozer  MD on 04/07/2010   Method used:   Electronically to        [facility] [phone]* (retail)       [address]       [place], [place]  [address]       Ph: [phone]       Fax: [phone]   RxID:   [id number] FERROUS SULFATE 325 (65 FE) MG  TABS (FERROUS SULFATE) take 1 by mouth tid  #270 x 3   Entered and Authorized by:   Ancil Boozer  MD   Signed by:   Ancil Boozer  MD on 04/07/2010   Method used:   Electronically to        [facility]  [address] [phone]* (retail)       [address]       [place], [place]  [address]       Ph: [phone]       Fax: [phone]   RxID:   [id number] COLACE 100 MG  CAPS (DOCUSATE SODIUM) take 1 by mouth qd  #90 x 3   Entered and Authorized by:   Ancil Boozer  MD   Signed by:   Ancil Boozer  MD on 04/07/2010   Method used:   Electronically to        [facility] [phone]* (retail)       [address]       [place], [place]  [address]       Ph: [phone]       Fax: [phone]   RxID:   [id number] METFORMIN HCL 500 MG TABS (METFORMIN HCL) Take 1 tablet by mouth twice a day  #180 x 3   Entered and Authorized by:   Ancil Boozer  MD   Signed by:   Ancil Boozer  MD on 04/07/2010   Method used:   Electronically to        [facility] [phone]* (retail)       [address]       [place], [place]  [address]       Ph: [phone]       Fax: [phone]   RxID:   [id number] FUROSEMIDE 20 MG TABS (FUROSEMIDE) Take 1 tab by mouth daily as need for swelling.  #90 x 3   Entered and Authorized by:   Ancil Boozer  MD   Signed by:   Ancil Boozer  MD on 04/07/2010   Method used:   Electronically to        [facility] [phone]* (retail)       [address]       [place], [place]  [address]       Ph: [phone]       Fax: [phone]   RxID:   [id number] CLONIDINE HCL 0.2 MG TAB (CLONIDINE HCL) Take 1 tablet by mouth twice a day  #180 x 3    Entered and Authorized by:   Ancil Boozer  MD   Signed by:   Ancil Boozer  MD on 04/07/2010   Method used:   Electronically to        [facility] [address] [phone]* (retail)       [address]       [place], [place]  [address]       Ph: [phone]       Fax: [phone]   RxID:   [id number] ACCUPRIL 40 MG TABS (QUINAPRIL HCL) Take 1 tablet by mouth once a day  #90 x 3   Entered and Authorized by:   Ancil Boozer  MD   Signed by:   Ancil Boozer  MD on 04/07/2010   Method used:   Electronically to        [facility] [phone]* (retail)       [address]       [place], [place]  [address]       Ph: [phone]       Fax: [phone]   RxID:   [id number]   Prevention & Chronic Care Immunizations   Influenza vaccine: Fluvax Non-MCR  (08/18/2009)   Influenza vaccine deferral: Not available  (06/13/2009)   Influenza vaccine due: 08/06/2009  Tetanus booster: 01/31/2004: Done.   Tetanus booster due: 01/30/2014    Pneumococcal vaccine: Not documented  Colorectal Screening   Hemoccult: Not documented   Hemoccult due: Not Indicated    Colonoscopy: Done.  (03/01/2006)   Colonoscopy due: 03/01/2016  Other Screening   Pap smear: Done.  (01/31/2004)   Pap smear due: Not Indicated    Mammogram: Normal  (09/26/2009)   Mammogram due: 10/2010   Smoking status: never  (04/07/2010)  Diabetes Mellitus   HgbA1C: 6.0  (04/07/2010)   Hemoglobin A1C due: 10/09/2008    Eye exam: No diabetic retinopathy.     (01/05/2010)    Foot exam: yes  (01/14/2010)   Foot exam action/deferral: Do today   High risk foot: Not documented   Foot care education: Not documented    Urine microalbumin/creatinine ratio: Not documented   Urine microalbumin action/deferral: Not indicated    Diabetes flowsheet reviewed?: Yes   Progress toward A1C goal: At goal  Lipids   Total Cholesterol: 149  (01/19/2010)   LDL: 73  (01/19/2010)   LDL Direct: 86  (06/13/2009)   HDL: 63   (01/19/2010)   Triglycerides: 64  (01/19/2010)   Lipid panel due: 01/15/2011  Hypertension   Last Blood Pressure: 136 / 83  (04/07/2010)   Serum creatinine: 0.77  (01/19/2010)   Serum potassium 3.8  (01/19/2010)    Hypertension flowsheet reviewed?: Yes   Progress toward BP goal: At goal  Self-Management Support :   Personal Goals (by the next clinic visit) :     Personal A1C goal: 8  (07/24/2009)     Personal blood pressure goal: 130/80  (07/24/2009)     Personal LDL goal: 100  (06/13/2009)    Diabetes self-management support: Not documented    Diabetes self-management support not done because: Good outcomes  (08/12/2009)    Hypertension self-management support: BP self-monitoring log  (07/24/2009)    Hypertension self-management support not done because: Good outcomes  (04/07/2010)  Laboratory Results   Blood Tests   Date/Time Received: April 07, 2010 9:03 AM  Date/Time Reported: April 07, 2010 9:51 AM   HGBA1C: 6.0%   (Normal Range: Non-Diabetic - 3-6%   Control Diabetic - 6-8%)  Comments: ...........test performed by...........Marland KitchenTerese Door, CMA

## 2010-12-01 NOTE — Assessment & Plan Note (Signed)
Summary: cough & allergy/[place]   Vital Signs:  Patient profile:   55 year old female Height:      64.75 inches Weight:      298 pounds BMI:     50.15 BSA:     2.34 Temp:     98.4 degrees F Pulse rate:   81 / minute BP sitting:   129 / 93  Vitals Entered By: Jone Baseman CMA (May 27, 2010 8:52 AM) CC: cough and allergies x 1 week Is Patient Diabetic? No Pain Assessment Patient in pain? no        Primary Care Provider:  Ancil Boozer  MD  CC:  cough and allergies x 1 week.  History of Present Illness: [facility] Jacquelyne Balint.  Ms. [name] comes in today for complaint of cough and nasal drip for about 1 month.  Has had these sxs in the past during summer months.  No identified triggers.  No sick contacts.  She herself does not feel sick.  No chest pain, has had cough and dyspnea at night when she lays down.  Sleeps on several pillows to elevate her head.   No fevers or chills, no sputum production.  No hemoptysis.  Not a smoker, quit 1999.  No diarrhea, no vomiting.   Has used otc nasal spray without relief.  Is afraid to take other otcs because of blood pressure meds.   Habits & Providers  Alcohol-Tobacco-Diet     Tobacco Status: never  Current Medications (verified): 1)  Accupril 40 Mg Tabs (Quinapril Hcl) .... Take 1 Tablet By Mouth Once A Day 2)  Clonidine Hcl 0.2 Mg Tab (Clonidine Hcl) .... Take 1 Tablet By Mouth Twice A Day 3)  Furosemide 20 Mg Tabs (Furosemide) .... Take 1 Tab By Mouth Daily As Need For Swelling. 4)  Metformin Hcl 500 Mg Tabs (Metformin Hcl) .... Take 1 Tablet By Mouth Twice A Day 5)  Tylenol Arthritis Pain 650 Mg Cr-Tabs (Acetaminophen) .Marland Kitchen.. 1 By Mouth Two Times A Day To Three Times A Day As Needed Pain 6)  Colace 100 Mg  Caps (Docusate Sodium) .... Take 1 By Mouth Qd 7)  Ferrous Sulfate 325 (65 Fe) Mg  Tabs (Ferrous Sulfate) .... Take 1 By Mouth Tid 8)  Polyethylene Glycol 3350  Powd (Polyethylene Glycol  3350) .... Dissolve 17 G in 4-8 Oz of Water of Juice Bid 9)  Nitroglycerin 0.4 Mg Subl (Nitroglycerin) .Marland Kitchen.. 1 Tab Sublingual Q 5 Minutes As Needed Chest Pain.  If 3 Doses Does Not Relieve Pain, Call 911 10)  Spironolactone 50 Mg Tabs (Spironolactone) .Marland Kitchen.. 1 By Mouth Once Daily For Blood Pressure 11)  Ambien 10 Mg Tabs (Zolpidem Tartrate) .... 1/2 - 1 By Mouth At Bedtime As Needed Insomnia 12)  Centrum Silver  Tabs (Multiple Vitamins-Minerals) 13)  B Complex  Tabs (B Complex Vitamins) 14)  Oyst-Cal D 250-125 Mg-Unit Tabs (Calcium Carbonate-Vitamin D) 15)  Hydralazine Hcl 25 Mg Tabs (Hydralazine Hcl) .Marland Kitchen.. 1 By Mouth Three Times A Day For Blood Pressure 16)  Carvedilol 25 Mg Tabs (Carvedilol) .Marland Kitchen.. 1 By Mouth Two Times A Day For Blood Pressure.  Replaces Cardiazem and Metoprolol. 17)  Nexium 40 Mg Cpdr (Esomeprazole Magnesium) .... Take 1 Tab Each Morning 18)  Hydrocortisone 2.5 % Oint (Hydrocortisone) .... Apply To Rash Two Times A Day For 10 Day Then As Needed, 60 Gm 19)  Isosorbide Mononitrate Cr 60 Mg Xr24h-Tab (Isosorbide Mononitrate) .... Once Daily Per  Cards 20)  Diltiazem Hcl 30 Mg Tabs (Diltiazem Hcl) .... Two Times A Day Per Cards 21)  Fexofenadine Hcl 180 Mg Tabs (Fexofenadine Hcl) .... Sig Take 1 Tab By Mouth One Time Daily Disp 30 22)  Flonase 50 Mcg/act Susp (Fluticasone Propionate) .... Sig: 2 Sprays/nostril One Time Daily in The Morning After Nasal Rinse Disp 1 Unit  Allergies (verified): 1)  ! Acetyl Salicylic Acid (Aspirin) 2)  ! Nsaids  Physical Exam  General:  Obese female, no apparent distress. Breathing comfortably and speaking fluidly.  Ears:  External ear exam shows no significant lesions or deformities.  Otoscopic examination reveals clear canals, tympanic membranes are intact bilaterally without bulging, retraction, inflammation or discharge. Hearing is grossly normal bilaterally. Nose:  No sinus tenderness.  Thin watery nasal secretions. Mucosa not boggy or blue.    Mouth:  Oral mucosa and oropharynx without lesions or exudates.  Teeth in good repair. Neck:  No deformities, masses, or tenderness noted. Lungs:  Normal respiratory effort, chest expands symmetrically. Lungs are clear to auscultation, no crackles or wheezes. Heart:  Normal rate and regular rhythm. S1 and S2 normal without gallop, murmur, click, rub or other extra sounds.   Impression & Recommendations:  Problem # 1:  ALLERGIC RHINITIS CAUSE UNSPECIFIED (ICD-477.9)  Allergic rhinitis, seems to recur each summer.  Discussed various supportive interventions, including nasal saline rinse, nasal steroid spray, and nonsedating antihistamines.  She has some sleep issues and appears to be at risk for OSA, would consider workup of this if not already pursued.   Her updated medication list for this problem includes:    Fexofenadine Hcl 180 Mg Tabs (Fexofenadine hcl) ..... Sig take 1 tab by mouth one time daily disp 30    Flonase 50 Mcg/act Susp (Fluticasone propionate) ..... Sig: 2 sprays/nostril one time daily in the morning after nasal rinse disp 1 unit  Orders: [facility]- Est Level  3 (16109)  Complete Medication List: 1)  Accupril 40 Mg Tabs (Quinapril hcl) .... Take 1 tablet by mouth once a day 2)  Clonidine Hcl 0.2 Mg Tab (Clonidine hcl) .... Take 1 tablet by mouth twice a day 3)  Furosemide 20 Mg Tabs (Furosemide) .... Take 1 tab by mouth daily as need for swelling. 4)  Metformin Hcl 500 Mg Tabs (Metformin hcl) .... Take 1 tablet by mouth twice a day 5)  Tylenol Arthritis Pain 650 Mg Cr-tabs (Acetaminophen) .Marland Kitchen.. 1 by mouth two times a day to three times a day as needed pain 6)  Colace 100 Mg Caps (Docusate sodium) .... Take 1 by mouth qd 7)  Ferrous Sulfate 325 (65 Fe) Mg Tabs (Ferrous sulfate) .... Take 1 by mouth tid 8)  Polyethylene Glycol 3350 Powd (Polyethylene glycol 3350) .... Dissolve 17 g in 4-8 oz of water of juice bid 9)  Nitroglycerin 0.4 Mg Subl (Nitroglycerin) .Marland Kitchen.. 1 tab  sublingual q 5 minutes as needed chest pain.  if 3 doses does not relieve pain, call 911 10)  Spironolactone 50 Mg Tabs (Spironolactone) .Marland Kitchen.. 1 by mouth once daily for blood pressure 11)  Ambien 10 Mg Tabs (Zolpidem tartrate) .... 1/2 - 1 by mouth at bedtime as needed insomnia 12)  Centrum Silver Tabs (Multiple vitamins-minerals) 13)  B Complex Tabs (B complex vitamins) 14)  Oyst-cal D 250-125 Mg-unit Tabs (Calcium carbonate-vitamin d) 15)  Hydralazine Hcl 25 Mg Tabs (Hydralazine hcl) .Marland Kitchen.. 1 by mouth three times a day for blood pressure 16)  Carvedilol 25 Mg Tabs (Carvedilol) .Marland KitchenMarland KitchenMarland Kitchen 1  by mouth two times a day for blood pressure.  replaces cardiazem and metoprolol. 17)  Nexium 40 Mg Cpdr (Esomeprazole magnesium) .... Take 1 tab each morning 18)  Hydrocortisone 2.5 % Oint (Hydrocortisone) .... Apply to rash two times a day for 10 day then as needed, 60 gm 19)  Isosorbide Mononitrate Cr 60 Mg Xr24h-tab (Isosorbide mononitrate) .... Once daily per cards 20)  Diltiazem Hcl 30 Mg Tabs (Diltiazem hcl) .... Two times a day per cards 21)  Fexofenadine Hcl 180 Mg Tabs (Fexofenadine hcl) .... Sig take 1 tab by mouth one time daily disp 30 22)  Flonase 50 Mcg/act Susp (Fluticasone propionate) .... Sig: 2 sprays/nostril one time daily in the morning after nasal rinse disp 1 unit  Patient Instructions: 1)  It was a pleasure to see you today.   2)  We believe you have environmental allergies.  You may use a Architectural technologist (from pharmacy-- over the counter). 3)  We recommend an allergy medicine that is not sedating, one time daily (fexofenadine or loratadine). 4)  A nasal steroid spray, Flonase, 2 sprays in each nostril in the morning, after the Netty pot or nasal saline spray.  Prescriptions: FLONASE 50 MCG/ACT SUSP (FLUTICASONE PROPIONATE) SIG: 2 sprays/nostril one time daily in the morning after nasal rinse DISP 1 unit  #1 x 6   Entered and Authorized by:   Paula Compton MD   Signed by:   Paula Compton MD on  05/27/2010   Method used:   Electronically to        [facility] [phone]* (retail)       [address]       [place], [place]  [address]       Ph: [phone]       Fax: [phone]   RxID:   [id number] FEXOFENADINE HCL 180 MG TABS (FEXOFENADINE HCL) SIG Take 1 tab by mouth one time daily DISP 30  #30 x 5   Entered and Authorized by:   Paula Compton MD   Signed by:   Paula Compton MD on 05/27/2010   Method used:   Electronically to        [facility] [phone]* (retail)       [address]       [place], [place]  [address]       Ph: [phone]       Fax: [phone]   RxID:   [id number]

## 2010-12-01 NOTE — Assessment & Plan Note (Signed)
Summary: f/u,df   Vital Signs:  Patient profile:   55 year old female Height:      64.75 inches Weight:      298.8 pounds BMI:     50.29 Temp:     98.0 degrees F oral Pulse rate:   87 / minute BP sitting:   137 / 85  (left arm) Cuff size:   large  Vitals Entered By: Garen Grams LPN (January 14, 2010 9:50 AM) CC: f/u dm, HTN, back pain Is Patient Diabetic? Yes Did you bring your meter with you today? No Pain Assessment Patient in pain? yes     Location: lower back    Primary Care Provider:  Ancil Boozer  MD  CC:  f/u dm, HTN, and back pain.  History of Present Illness: DM: checking sugars regularly without reported highs or lows.  didn't bring meter today.  denies any problems with her meds. due for foot, lipid check  HTN: states she was started on 2 new meds by cardiologist but cannot remember them.  will try to get records.  asked her that she call and let me know the names of the meds so we can have them documented.  states her BP is often higher at home than it has been today and at her cardiologist visit.  reports similar BP at her cardiologist visit as she is today. denies chest pain.  does note legs swelling some.   Back pain: started about 2-3 weeks ago.  was exercising at [employer].  pain is constant and worse with bending forward.  heat helps a little.  vicodin she had left over from the past didn't help.  no pain in legs.  denies weakness, denies incontinence but does note maybe some urinary frequency and urgency - denies burning and does say she has increased her water consumption in hopes of a healthier lifestyle so she thinks this is the cause.  tylenol arthritis does help a little but she is only taking it once a day.   Habits & Providers  Alcohol-Tobacco-Diet     Tobacco Status: never     Podiatrist: Ancil Boozer MD  Current Medications (verified): 1)  Accupril 40 Mg Tabs (Quinapril Hcl) .... Take 1 Tablet By Mouth Once A Day 2)  Clonidine Hcl 0.2 Mg Tab  (Clonidine Hcl) .... Take 1 Tablet By Mouth Twice A Day 3)  Furosemide 20 Mg Tabs (Furosemide) .... Take 1 Tab By Mouth Daily As Need For Swelling. 4)  Metformin Hcl 500 Mg Tabs (Metformin Hcl) .... Take 1 Tablet By Mouth Twice A Day 5)  Tylenol Arthritis Pain 650 Mg Cr-Tabs (Acetaminophen) .Marland Kitchen.. 1 By Mouth Two Times A Day To Three Times A Day As Needed Pain 6)  Colace 100 Mg  Caps (Docusate Sodium) .... Take 1 By Mouth Qd 7)  Ferrous Sulfate 325 (65 Fe) Mg  Tabs (Ferrous Sulfate) .... Take 1 By Mouth Tid 8)  Polyethylene Glycol 3350  Powd (Polyethylene Glycol 3350) .... Dissolve 17 G in 4-8 Oz of Water of Juice Bid 9)  Nitroglycerin 0.4 Mg Subl (Nitroglycerin) .Marland Kitchen.. 1 Tab Sublingual Q 5 Minutes As Needed Chest Pain.  If 3 Doses Does Not Relieve Pain, Call 911 10)  Spironolactone 50 Mg Tabs (Spironolactone) .Marland Kitchen.. 1 By Mouth Once Daily For Blood Pressure 11)  Ambien 10 Mg Tabs (Zolpidem Tartrate) .... 1/2 - 1 By Mouth At Bedtime As Needed Insomnia 12)  Centrum Silver  Tabs (Multiple Vitamins-Minerals) 13)  B Complex  Tabs (  B Complex Vitamins) 14)  Oyst-Cal D 250-125 Mg-Unit Tabs (Calcium Carbonate-Vitamin D) 15)  Hydralazine Hcl 25 Mg Tabs (Hydralazine Hcl) .Marland Kitchen.. 1 By Mouth Three Times A Day For Blood Pressure 16)  Carvedilol 25 Mg Tabs (Carvedilol) .Marland Kitchen.. 1 By Mouth Two Times A Day For Blood Pressure.  Replaces Cardiazem and Metoprolol. 17)  Nexium 40 Mg Cpdr (Esomeprazole Magnesium) .... Take 1 Tab Each Morning 18)  Hydrocortisone 2.5 % Oint (Hydrocortisone) .... Apply To Rash Two Times A Day For 10 Day Then As Needed, 60 Gm  Allergies (verified): 1)  ! Acetyl Salicylic Acid (Aspirin) 2)  ! Nsaids  Past History:  Past medical, surgical, family and social histories (including risk factors) reviewed for relevance to current acute and chronic problems.  Past Medical History: Reviewed history from 07/24/2009 and no changes required. *******ASA/ NSAIDS CONTRAINDICATED ( recurrent GI bleed, see  below) -- PANDIVERTICULOSIS ( Colonoscopy Dr. Arlyce Dice in 2007) --Gastrointestinal hemorrhage:   1- hx of  GI  bleed when on NSAIDS (rectal bleeding x2  while on  tx  for knee                                                                     pain in 2002)                                                    2-- -GI BLEED RECURRENT  x4 :  2 to colon diverticula (11/03/05 x2).                                                                                F/u with Surgery: Dr. 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  -creatinine 0.7 10/06 ( 2+ microalbumin 10/06)  Past Surgical History: Reviewed history from 09/15/2008 and no changes required. -Abd. 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 -Colonoscopy (difuse diverticulosis) - 03/01/2006 Dr. Arlyce Dice -EGD: normal ( 03/2006) - CT - Head 10/06 (L putamen infarct) - 10/28/2005 - Echo EF: 60 % mild/mod LVH - 08/19/2005, - exercise cardiolite(EF 67%/nl perfusion) - 10/01/2005 - hysterectomy - complete   95 - 07/21/2004 - knee replacement bilat. 98 - 07/21/2004  Family History: Reviewed history from 07/26/2007 and  no changes required. Colon CA 1st degree, Diabetes 1st degree, hypertension, stroke HTN ( brother) DM (brother)  Social History: Reviewed history from 09/15/2008 and no changes required. Awaiting for disability. Lives with 2  MR sisters ( she takes care of them by  her self )   Quit smoking marihuana in 2000  Currently no tobacco abuse. Quitted in  1999 (smoked everyday during 22 years). Used  to go to [employer] for weight loss. Currently doing water aerobics  to loose weight. Mom died at 28 yo 2 to DM. Father died 2 to colon CA in  1980's. 5 Brothers  (1948) , 1949 living in [place], [place] in [place], [place] in [place] in [place].  3 sisters 3, 79 ( both MR) now living with her , and 34 in [place]  Review of Systems       per HPI otherwise negative.  Physical Exam  General:  alert, morbidly obese VS reviewed Lungs:  Normal respiratory effort, chest expands symmetrically. Lungs are clear to auscultation, no crackles or wheezes. Heart:  Normal rate and regular rhythm. 1/6 SEM at LUSB non radiating to carotids or axilla.   Msk:  negative SLR.  no pain to palpation in lower back.  normal gait.  Extremities:  tr edema  Diabetes Management Exam:    Foot Exam (with socks and/or shoes not present):       Sensory-Pinprick/Light touch:          Left medial foot (L-4): normal          Left dorsal foot (L-5): normal          Left lateral foot (S-1): normal          Right medial foot (L-4): normal          Right dorsal foot (L-5): normal          Right lateral foot (S-1): normal       Sensory-Monofilament:          Left foot: diminished          Right foot: diminished       Sensory-other: diminished over calouses on heels       Inspection:          Left foot: normal          Right foot: normal       Nails:          Left foot: normal          Right foot: normal    Foot Exam by Podiatrist:       Date: 01/14/2010       Results: early diabetic findings       Done by: Ancil Boozer MD     Impression & Recommendations:  Problem # 1:  DIAB W/OTH MANIFESTS TYPE II/UNS NOT UNCNTRL (ICD-250.80) Assessment Unchanged at goal.  no changes   Her updated medication list for this problem includes:    Accupril 40 Mg Tabs (Quinapril hcl) .Marland Kitchen... Take 1 tablet by  mouth once a day    Metformin Hcl 500 Mg Tabs (Metformin hcl) .Marland Kitchen... Take 1 tablet by mouth twice a day  Orders: A1C-[facility] (56213) [facility]- Est  Level 4 (99214)Future Orders: Lipid-[facility] ([id number]) ... 12/31/2010  Problem # 2:  HYPERTENSION, UNCONTROLLED (ICD-401.9) Assessment: Improved improved overall.  have her call with new meds.   Her updated medication list for this problem includes:    Accupril 40 Mg Tabs (Quinapril hcl) .Marland Kitchen... Take 1 tablet  by mouth once a day    Clonidine Hcl 0.2 Mg Tab (Clonidine hcl) .Marland Kitchen... Take 1 tablet by mouth twice a day    Furosemide 20 Mg Tabs (Furosemide) .Marland Kitchen... Take 1 tab by mouth daily as need for swelling.    Spironolactone 50 Mg Tabs (Spironolactone) .Marland Kitchen... 1 by mouth once daily for blood pressure    Hydralazine Hcl 25 Mg Tabs (Hydralazine hcl) .Marland Kitchen... 1 by mouth three times a day for blood pressure    Carvedilol 25 Mg Tabs (Carvedilol) .Marland Kitchen... 1 by mouth two times a day for blood pressure.  replaces cardiazem and metoprolol.  Orders: [facility]- Est  Level 4 ([address]214)Future Orders: Comp Met-[facility] ([id number]) ... 12/31/2010  Problem # 3:  LOW BACK PAIN, ACUTE (ICD-724.2) Assessment: New  no red flags.  tylenol arthritis scheduled for next week then as needed  Her updated medication list for this problem includes:    Tylenol Arthritis Pain 650 Mg Cr-tabs (Acetaminophen) .Marland Kitchen... 1 by mouth two times a day to three times a day as needed pain  Orders: [facility]- Est  Level 4 (99214)  Complete Medication List: 1)  Accupril 40 Mg Tabs (Quinapril hcl) .... Take 1 tablet by mouth once a day 2)  Clonidine Hcl 0.2 Mg Tab (Clonidine hcl) .... Take 1 tablet by mouth twice a day 3)  Furosemide 20 Mg Tabs (Furosemide) .... Take 1 tab by mouth daily as need for swelling. 4)  Metformin Hcl 500 Mg Tabs (Metformin hcl) .... Take 1 tablet by mouth twice a day 5)  Tylenol Arthritis Pain 650 Mg Cr-tabs (Acetaminophen) .Marland Kitchen.. 1 by mouth two times a day to three times a day as needed pain 6)   Colace 100 Mg Caps (Docusate sodium) .... Take 1 by mouth qd 7)  Ferrous Sulfate 325 (65 Fe) Mg Tabs (Ferrous sulfate) .... Take 1 by mouth tid 8)  Polyethylene Glycol 3350 Powd (Polyethylene glycol 3350) .... Dissolve 17 g in 4-8 oz of water of juice bid 9)  Nitroglycerin 0.4 Mg Subl (Nitroglycerin) .Marland Kitchen.. 1 tab sublingual q 5 minutes as needed chest pain.  if 3 doses does not relieve pain, call 911 10)  Spironolactone 50 Mg Tabs (Spironolactone) .Marland Kitchen.. 1 by mouth once daily for blood pressure 11)  Ambien 10 Mg Tabs (Zolpidem tartrate) .... 1/2 - 1 by mouth at bedtime as needed insomnia 12)  Centrum Silver Tabs (Multiple vitamins-minerals) 13)  B Complex Tabs (B complex vitamins) 14)  Oyst-cal D 250-125 Mg-unit Tabs (Calcium carbonate-vitamin d) 15)  Hydralazine Hcl 25 Mg Tabs (Hydralazine hcl) .Marland Kitchen.. 1 by mouth three times a day for blood pressure 16)  Carvedilol 25 Mg Tabs (Carvedilol) .Marland Kitchen.. 1 by mouth two times a day for blood pressure.  replaces cardiazem and metoprolol. 17)  Nexium 40 Mg Cpdr (Esomeprazole magnesium) .... Take 1 tab each morning 18)  Hydrocortisone 2.5 % Oint (Hydrocortisone) .... Apply to rash two times a day for 10 day then as needed, 60 gm  Other Orders: Future Orders: CBC-[facility] ([id number]) ... 12/31/2010  Patient Instructions: 1)  Please follow up in 3 months or of course sooner if needed. 2)  Be sure to schedule a lab appointment in the morning when you've had nothing to eat. 3)  For the back pain - take tylenol arthritis regularly twice a day for the next week and if needed 3 times a day.  if you develop weakness or trouble holding your urine let us know right away. 4)  Please call me with the  names of your new medicines and if you need any refills so we can take care of it. 5)  It was nice to see you today! 6)  Blood pressure today: 137/85 7)  A1C today: 6  Prevention & Chronic Care Immunizations   Influenza vaccine: Fluvax Non-MCR  (08/18/2009)   Influenza vaccine  deferral: Not available  (06/13/2009)   Influenza vaccine due: 08/06/2009    Tetanus booster: 01/31/2004: Done.   Tetanus booster due: 01/30/2014    Pneumococcal vaccine: Not documented  Colorectal Screening   Hemoccult: Not documented   Hemoccult due: Not Indicated    Colonoscopy: Done.  (03/01/2006)   Colonoscopy due: 03/01/2016  Other Screening   Pap smear: Done.  (01/31/2004)   Pap smear due: Not Indicated    Mammogram: Normal  (09/26/2009)   Mammogram due: 10/2010   Smoking status: never  (01/14/2010)  Diabetes Mellitus   HgbA1C: 6.0  (01/14/2010)   Hemoglobin A1C due: 10/09/2008    Eye exam: No diabetic retinopathy.     (01/05/2010)    Foot exam: yes  (01/14/2010)   Foot exam action/deferral: Do today   High risk foot: Not documented   Foot care education: Not documented    Urine microalbumin/creatinine ratio: Not documented   Urine microalbumin action/deferral: Not indicated    Diabetes flowsheet reviewed?: Yes   Progress toward A1C goal: At goal  Lipids   Total Cholesterol: 169  (02/13/2008)   LDL: 93  (02/13/2008)   LDL Direct: 86  (06/13/2009)   HDL: 64  (02/13/2008)   Triglycerides: 61  (02/13/2008)   Lipid panel due: 01/15/2011  Hypertension   Last Blood Pressure: 137 / 85  (01/14/2010)   Serum creatinine: 0.71  (08/18/2009)   Serum potassium 4.2  (08/18/2009) CMP ordered     Hypertension flowsheet reviewed?: Yes   Progress toward BP goal: Unchanged  Self-Management Support :   Personal Goals (by the next clinic visit) :     Personal A1C goal: 8  (07/24/2009)     Personal blood pressure goal: 130/80  (07/24/2009)     Personal LDL goal: 100  (06/13/2009)    Diabetes self-management support: Not documented    Diabetes self-management support not done because: Good outcomes  (08/12/2009)    Hypertension self-management support: BP self-monitoring log  (07/24/2009)    Hypertension self-management support not done because: Good outcomes   (01/14/2010)   Nursing Instructions: Diabetic foot exam today    Laboratory Results   Blood Tests   Date/Time Received: January 14, 2010 9:42 AM  Date/Time Reported: January 14, 2010 9:59 AM   HGBA1C: 6.0%   (Normal Range: Non-Diabetic - 3-6%   Control Diabetic - 6-8%)  Comments: ...............test performed by......Marland KitchenBonnie A. [place], MLS (ASCP)cm

## 2010-12-01 NOTE — Assessment & Plan Note (Signed)
Summary: CPE/ls   Vital Signs:  Patient profile:   55 year old female Height:      64.75 inches Weight:      296.8 pounds BMI:     49.95 Temp:     98.4 degrees F oral Pulse rate:   80 / minute BP sitting:   138 / 91  (left arm) Cuff size:   large  Vitals Entered By: Garen Grams LPN (June 30, 2010 3:30 PM) CC: cpe Is Patient Diabetic? Yes Did you bring your meter with you today? No Pain Assessment Patient in pain? no        Primary Care Provider:  Alvia Grove DO  CC:  cpe.  History of Present Illness: 55 yo female here for yearly PE and paperwork that needs to be signed.  Concerns: Recently seen by cardiology for chest pain, pt states she is suppose to get a heart cath next week by Dr. Algie Coffer.  Pt reports having an echo done at last visit with him, but she is unsure of the results.  no chest pain currently, no SOB, no dizziness, no weakness.     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  (ICD-280.9) 11)  Obesity, Nos  (ICD-278.00) 12)  Foot Pain, Bilateral  (ICD-729.5) 13)  Postmenopausal Status  (ICD-V49.81) 14)  Libido, Decreased  (ICD-799.81) 15)  Cyst, Kidney, Acquired  (ICD-593.2)  Current Medications (verified): 1)  Accupril 40 Mg Tabs (Quinapril Hcl) .... Take 1 Tablet By Mouth Once A Day 2)  Clonidine Hcl 0.2 Mg Tab (Clonidine Hcl) .... Take 1 Tablet By Mouth Twice A Day 3)  Furosemide 20 Mg Tabs (Furosemide) .... Take 1  Tab By Mouth Daily As Need For Swelling. 4)  Metformin Hcl 500 Mg Tabs (Metformin Hcl) .... Take 1 Tablet By Mouth Twice A Day 5)  Tylenol Arthritis Pain 650 Mg Cr-Tabs (Acetaminophen) .Marland Kitchen.. 1 By Mouth Two Times A Day To Three Times A Day As Needed Pain 6)  Colace 100 Mg  Caps (Docusate Sodium) .... Take 1 By Mouth Qd 7)  Ferrous Sulfate 325 (65 Fe) Mg  Tabs (Ferrous Sulfate) .... Take 1 By Mouth Tid 8)  Polyethylene Glycol 3350  Powd (Polyethylene Glycol 3350) .... Dissolve 17 G in 4-8 Oz of Water of Juice Bid 9)  Nitroglycerin 0.4 Mg Subl (Nitroglycerin) .Marland Kitchen.. 1 Tab Sublingual Q 5 Minutes As Needed Chest Pain.  If 3 Doses Does Not Relieve Pain, Call 911 10)  Spironolactone 50 Mg Tabs (Spironolactone) .Marland Kitchen.. 1 By Mouth Once Daily For Blood Pressure 11)  Ambien 10 Mg Tabs (Zolpidem Tartrate) .... 1/2 - 1 By Mouth At Bedtime As Needed Insomnia 12)  Centrum Silver  Tabs (Multiple Vitamins-Minerals) 13)  B Complex  Tabs (B Complex Vitamins) 14)  Oyst-Cal D 250-125 Mg-Unit Tabs (Calcium Carbonate-Vitamin D) 15)  Hydralazine Hcl 25 Mg Tabs (Hydralazine Hcl) .Marland Kitchen.. 1 By Mouth Three Times A Day For Blood Pressure 16)  Carvedilol 25 Mg Tabs (Carvedilol) .Marland Kitchen.. 1 By Mouth Two Times A  Day For Blood Pressure.  Replaces Cardiazem and Metoprolol. 17)  Nexium 40 Mg Cpdr (Esomeprazole Magnesium) .... Take 1 Tab Each Morning 18)  Hydrocortisone 2.5 % Oint (Hydrocortisone) .... Apply To Rash Two Times A Day For 10 Day Then As Needed, 60 Gm 19)  Isosorbide Mononitrate Cr 60 Mg Xr24h-Tab (Isosorbide Mononitrate) .... Once Daily Per Cards 20)  Diltiazem Hcl 30 Mg Tabs (Diltiazem Hcl) .... Two Times A Day Per Cards 21)  Fexofenadine Hcl 180 Mg Tabs (Fexofenadine Hcl) .... Sig Take 1 Tab By Mouth One Time Daily Disp 30 22)  Flonase 50 Mcg/act Susp (Fluticasone Propionate) .... Sig: 2 Sprays/nostril One Time Daily in The Morning After Nasal Rinse Disp 1 Unit  Allergies (verified): 1)  ! Acetyl Salicylic Acid (Aspirin) 2)  !  Nsaids  Past History:  Past Medical History: Last updated: 04/09/2010 *******ASA/ NSAIDS CONTRAINDICATED ( recurrent GI bleed, see below) -- PANDIVERTICULOSIS ( Colonoscopy Dr. Arlyce Dice in 2007) --Gastrointestinal hemorrhage:   1- hx of  GI  bleed when on NSAIDS (rectal bleeding x2  while on  tx  for knee                                                                     pain in 2002)                                                    2-- -GI BLEED RECURRENT  x4 :  2 to colon diverticula (11/03/05 x2).                                                                                F/u with Surgery: Dr. 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  -creatinine 0.7 10/06 ( 2+ microalbumin 10/06) POSTMENOPAUSAL STATUS (ICD-V49.81) VENOUS INSUFFICIENCY, CHRONIC (ICD-459.81)  Past Surgical History: Last updated: 09/15/2008 -Abd. 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 -Colonoscopy (difuse diverticulosis) - 03/01/2006 Dr. Arlyce Dice -EGD: normal ( 03/2006) - CT - Head 10/06 (L putamen infarct) - 10/28/2005 - Echo EF: 60 % mild/mod LVH - 08/19/2005, - exercise cardiolite(EF 67%/nl perfusion) - 10/01/2005 - hysterectomy - complete   95 - 07/21/2004 -  knee replacement bilat. 98 - 07/21/2004  Family History: Last updated: 04/09/2010 Colon CA 1st degree, Diabetes 1st degree,  hypertension, stroke HTN ( brother) DM (brother) Mom died at 69 yo 2 to DM. Father died 2 to colon CA in  1980's. mental retardation in sisters  Social History: Last updated: 04/09/2010 Awaiting for disability. Lives with 2  MR sisters ( she takes care of them by her self - one is patient here - [name] )   Quit smoking THC in 2000  Currently no tobacco abuse. Quit in  1999 (smoked everyday during 22 years). Used  to go to [employer] for weight loss. Currently doing water aerobics or going to [employer]  to lose weight.  5 Brothers  (1948) , 1949 living in [place], [place] in [place], [place] in [place] in [place].  3 sisters 30, 26 ( both MR) now living with her , and 83 in [place]  Risk Factors: Smoking Status: never (06/30/2010)  Family History: Reviewed history from 04/09/2010 and no changes required. Colon CA 1st degree, Diabetes 1st degree, hypertension, stroke HTN ( brother) DM (brother) Mom died at 48 yo 2 to DM. Father died 2 to colon CA in  1980's. mental retardation in sisters  Social History: Reviewed history from 04/09/2010 and no changes required. Awaiting for disability. Lives with 2  MR sisters ( she takes care of them by her self - one is patient here - [name] )   Quit smoking THC in 2000  Currently no tobacco abuse. Quit in  1999 (smoked everyday during 22 years). Used  to go to [employer] for weight loss. Currently doing water aerobics or going to [employer]  to lose weight.  5 Brothers  (1948) , 1949 living in [place], [place] in [place], [place] in [place] in [place].  3 sisters 19, 13 ( both MR) now living with her , and 53 in [place]  Review of Systems  The patient denies weight loss, weight gain, chest pain, syncope, dyspnea on exertion, peripheral edema, and prolonged cough.    Physical Exam  General:  VS reviewed, alert, well-developed, well-nourished, and well-hydrated.   Head:  Normocephalic and atraumatic without obvious abnormalities. No apparent alopecia or  balding. Eyes:  No corneal or conjunctival inflammation noted. EOMI. Perrla. Funduscopic exam benign, without hemorrhages, exudates or papilledema. Vision grossly normal. Ears:  External ear exam shows no significant lesions or deformities.  Otoscopic examination reveals clear canals, tympanic membranes are intact bilaterally without bulging, retraction, inflammation or discharge. Hearing is grossly normal bilaterally. Nose:  No sinus tenderness.  Thin watery nasal secretions. Mucosa not boggy or blue.  Mouth:  Oral mucosa and oropharynx without lesions or exudates.  Teeth in good repair. Neck:  supple, full ROM, no masses, no thyromegaly, and no JVD.   Lungs:  Normal respiratory effort, chest expands symmetrically. Lungs are clear to auscultation, no crackles or wheezes. Heart:  Normal rate and regular rhythm. S1 and S2 normal without gallop, murmur, click, rub or other extra sounds. Abdomen:  obese so examination is somewhat limited.  soft.  normoactive bowel sounds.  nondistended.  no masses appreciated.  mildly tender RUQ and suprapubic region.  no hernias apprecaited.  no rebound or guarding Msk:  no joint tenderness, no joint swelling, and no joint warmth.   Extremities:  tr edema Neurologic:  No cranial nerve deficits noted. Station and gait are normal. Sensory, motor and coordinative functions appear intact.  alert & oriented X3, strength normal  in all extremities, gait normal, finger-to-nose normal, and Romberg negative.   Skin:  turgor normal, color normal, and no rashes.   Cervical Nodes:  No lymphadenopathy noted Axillary Nodes:  No palpable lymphadenopathy   Impression & Recommendations:  Problem # 1:  CHEST PAIN, ATYPICAL (ICD-786.59) will request records from cards. If pt gets cath advised to hold some meds (see pt instructions). No chest pain currently  Problem # 2:  SCREENING, PULMONARY TUBERCULOSIS (ICD-V74.1) PPD negative. Completed paperwork for pt.   Complete  Medication List: 1)  Accupril 40 Mg Tabs (Quinapril hcl) .... Take 1 tablet by mouth once a day 2)  Clonidine Hcl 0.2 Mg Tab (Clonidine hcl) .... Take 1 tablet by mouth twice a day 3)  Furosemide 20 Mg Tabs (Furosemide) .... Take 1 tab by mouth daily as need for swelling. 4)  Metformin Hcl 500 Mg Tabs (Metformin hcl) .... Take 1 tablet by mouth twice a day 5)  Tylenol Arthritis Pain 650 Mg Cr-tabs (Acetaminophen) .Marland Kitchen.. 1 by mouth two times a day to three times a day as needed pain 6)  Colace 100 Mg Caps (Docusate sodium) .... Take 1 by mouth qd 7)  Ferrous Sulfate 325 (65 Fe) Mg Tabs (Ferrous sulfate) .... Take 1 by mouth tid 8)  Polyethylene Glycol 3350 Powd (Polyethylene glycol 3350) .... Dissolve 17 g in 4-8 oz of water of juice bid 9)  Nitroglycerin 0.4 Mg Subl (Nitroglycerin) .Marland Kitchen.. 1 tab sublingual q 5 minutes as needed chest pain.  if 3 doses does not relieve pain, call 911 10)  Spironolactone 50 Mg Tabs (Spironolactone) .Marland Kitchen.. 1 by mouth once daily for blood pressure 11)  Ambien 10 Mg Tabs (Zolpidem tartrate) .... 1/2 - 1 by mouth at bedtime as needed insomnia 12)  Centrum Silver Tabs (Multiple vitamins-minerals) 13)  B Complex Tabs (B complex vitamins) 14)  Oyst-cal D 250-125 Mg-unit Tabs (Calcium carbonate-vitamin d) 15)  Hydralazine Hcl 25 Mg Tabs (Hydralazine hcl) .Marland Kitchen.. 1 by mouth three times a day for blood pressure 16)  Carvedilol 25 Mg Tabs (Carvedilol) .Marland Kitchen.. 1 by mouth two times a day for blood pressure.  replaces cardiazem and metoprolol. 17)  Nexium 40 Mg Cpdr (Esomeprazole magnesium) .... Take 1 tab each morning 18)  Hydrocortisone 2.5 % Oint (Hydrocortisone) .... Apply to rash two times a day for 10 day then as needed, 60 gm 19)  Isosorbide Mononitrate Cr 60 Mg Xr24h-tab (Isosorbide mononitrate) .... Once daily per cards 20)  Diltiazem Hcl 30 Mg Tabs (Diltiazem hcl) .... Two times a day per cards 21)  Fexofenadine Hcl 180 Mg Tabs (Fexofenadine hcl) .... Sig take 1 tab by mouth one  time daily disp 30 22)  Flonase 50 Mcg/act Susp (Fluticasone propionate) .... Sig: 2 sprays/nostril one time daily in the morning after nasal rinse disp 1 unit  Other Orders: [facility]- Est Level  3 ([address]109)  Patient Instructions: 1)  Prior to your heart cath make sure you stop taking the Lasix, metformin, and spironolactone, quinapril. 2)  Please call me and let me know when it is scheduled. 3)  Follow up after your heart cath and we will see if your stomach is still hurting Prescriptions: DILTIAZEM HCL 30 MG TABS (DILTIAZEM HCL) two times a day per cards  #180 x 3   Entered and Authorized by:   Alvia Grove DO   Signed by:   Alvia Grove DO on 06/30/2010   Method used:   Electronically to  [facility] [address] [phone]* (retail)       [address]       [place], [place]  [address]       Ph: [phone]       Fax: [phone]   RxID:   [id number] ISOSORBIDE MONONITRATE CR 60 MG XR24H-TAB (ISOSORBIDE MONONITRATE) once daily per cards  #90 x 3   Entered and Authorized by:   Alvia Grove DO   Signed by:   Alvia Grove DO on 06/30/2010   Method used:   Electronically to        [facility] [phone]* (retail)       [address]       [place], [place]  [address]       Ph: [phone]       Fax: [phone]   RxID:   [id number] CARVEDILOL 25 MG TABS (CARVEDILOL) 1 by mouth two times a day for blood pressure.  Replaces cardiazem and metoprolol.  #180 x 3   Entered and Authorized by:   Alvia Grove DO   Signed by:   Alvia Grove DO on 06/30/2010   Method used:   Electronically to        [facility] [phone]* (retail)       [address]       [place], [place]  [address]       Ph: [phone]       Fax: [phone]   RxID:   [id number] HYDRALAZINE HCL 25 MG TABS (HYDRALAZINE HCL) 1 by mouth three times a day for blood pressure  #270 x 3   Entered and Authorized by:   Alvia Grove DO   Signed by:   Alvia Grove DO on 06/30/2010   Method used:    Electronically to        [facility] [phone]* (retail)       [address]       [place], [place]  [address]       Ph: [phone]       Fax: [phone]   RxID:   [id number] SPIRONOLACTONE 50 MG TABS (SPIRONOLACTONE) 1 by mouth once daily for blood pressure  #90 x 3   Entered and Authorized by:   Alvia Grove DO   Signed by:   Alvia Grove DO on 06/30/2010   Method used:   Electronically to        [facility] [phone]* (retail)       [address]       [place], [place]  [address]       Ph: [phone]       Fax: [phone]   RxID:   [id number] POLYETHYLENE GLYCOL 3350  POWD (POLYETHYLENE GLYCOL 3350) Dissolve 17 g in 4-8 oz of water of juice bid  #3 mo supply x 3   Entered and Authorized by:   Alvia Grove DO   Signed by:   Alvia Grove DO on 06/30/2010   Method used:   Electronically to        [facility] [phone]* (retail)       [address]       [place], [place]  [address]       Ph: [phone]       Fax: [phone]   RxID:   [id number] FERROUS SULFATE 325 (65 FE) MG  TABS (FERROUS SULFATE) take 1 by mouth tid  #270 x 3   Entered and Authorized by:   Alvia Grove  DO   Signed by:   Alvia Grove DO on 06/30/2010   Method used:   Electronically to        [facility] [phone]* (retail)       [address]       [place], [place]  [address]       Ph: [phone]       Fax: [phone]   RxID:   [id number] COLACE 100 MG  CAPS (DOCUSATE SODIUM) take 1 by mouth qd  #90 x 3   Entered and Authorized by:   Alvia Grove DO   Signed by:   Alvia Grove DO on 06/30/2010   Method used:   Electronically to        [facility] [phone]* (retail)       [address]       [place], [place]  [address]       Ph: [phone]       Fax: [phone]   RxID:   [id number] METFORMIN HCL 500 MG TABS (METFORMIN HCL) Take 1 tablet by mouth twice a day  #180 x 3   Entered and Authorized by:   Alvia Grove DO   Signed  by:   Alvia Grove DO on 06/30/2010   Method used:   Electronically to        [facility] [phone]* (retail)       [address]       [place], [place]  [address]       Ph: [phone]       Fax: [phone]   RxID:   [id number] CLONIDINE HCL 0.2 MG TAB (CLONIDINE HCL) Take 1 tablet by mouth twice a day  #180 x 3   Entered and Authorized by:   Alvia Grove DO   Signed by:   Alvia Grove DO on 06/30/2010   Method used:   Electronically to        [facility] [phone]* (retail)       [address]       [place], [place]  [address]       Ph: [phone]       Fax: [phone]   RxID:   [id number] ACCUPRIL 40 MG TABS (QUINAPRIL HCL) Take 1 tablet by mouth once a day  #90 x 3   Entered and Authorized by:   Alvia Grove DO   Signed by:   Alvia Grove DO on 06/30/2010   Method used:   Electronically to        [facility] [phone]* (retail)       [address]       [place], [place]  [address]       Ph: [phone]       Fax: [phone]   RxID:   [id number]

## 2010-12-01 NOTE — Assessment & Plan Note (Signed)
Summary: BP ISSUES/[facility]   Vital Signs:  Patient profile:   55 year old female Height:      64.75 inches Weight:      299.3 pounds BMI:     50.37 Temp:     98.6 degrees F oral Pulse rate:   78 / minute BP sitting:   144 / 90  (left arm) Cuff size:   large  Vitals Entered By: Garen Grams LPN (May 11, 2010 8:39 AM) CC: swelling and pain in feet x 2 weeks Is Patient Diabetic? No Pain Assessment Patient in pain? yes     Location: feet   Primary Care Provider:  Ancil Boozer  MD  CC:  swelling and pain in feet x 2 weeks.  History of Present Illness: feet: swollen and painful over tops of both feet now for 2 weeks.  bottoms of feet as noted in prior visit still somewhat painful (not improved with use of pads) but a little better.  pain is worst in tops of feet when swelling is worse.  she has been using her as needed fluid pill.  the pain in her feet doesn't completely resolve when the swelling is improved.  she hasn't been trying anything to help it in particular other than the fluid pills to keep the swelling down.  she denies redness of the skin in the area and states that it is only occasionally a little warm.  she has no history of gout but does have a family history of gout.  she denies fevers.  she also denies sensory change in feet.  she does however describe some low back pain with pain occasionally shooting down the left leg at times for a few days.  this however isn't happening now and the foot pain persists.  she has been using a heating pad for her back.   she also as a side asks if many of her medical problems could be related to some sleep apnea.  she isn't sure if she snores or gasps for air.  she is tired and does have difficult to control HTN.   Habits & Providers  Alcohol-Tobacco-Diet     Tobacco Status: never  Current Medications (verified): 1)  Accupril 40 Mg Tabs (Quinapril Hcl) .... Take 1 Tablet By Mouth Once A Day 2)  Clonidine Hcl 0.2 Mg Tab (Clonidine Hcl)  .... Take 1 Tablet By Mouth Twice A Day 3)  Furosemide 20 Mg Tabs (Furosemide) .... Take 1 Tab By Mouth Daily As Need For Swelling. 4)  Metformin Hcl 500 Mg Tabs (Metformin Hcl) .... Take 1 Tablet By Mouth Twice A Day 5)  Tylenol Arthritis Pain 650 Mg Cr-Tabs (Acetaminophen) .Marland Kitchen.. 1 By Mouth Two Times A Day To Three Times A Day As Needed Pain 6)  Colace 100 Mg  Caps (Docusate Sodium) .... Take 1 By Mouth Qd 7)  Ferrous Sulfate 325 (65 Fe) Mg  Tabs (Ferrous Sulfate) .... Take 1 By Mouth Tid 8)  Polyethylene Glycol 3350  Powd (Polyethylene Glycol 3350) .... Dissolve 17 G in 4-8 Oz of Water of Juice Bid 9)  Nitroglycerin 0.4 Mg Subl (Nitroglycerin) .Marland Kitchen.. 1 Tab Sublingual Q 5 Minutes As Needed Chest Pain.  If 3 Doses Does Not Relieve Pain, Call 911 10)  Spironolactone 50 Mg Tabs (Spironolactone) .Marland Kitchen.. 1 By Mouth Once Daily For Blood Pressure 11)  Ambien 10 Mg Tabs (Zolpidem Tartrate) .... 1/2 - 1 By Mouth At Bedtime As Needed Insomnia 12)  Centrum Silver  Tabs (Multiple Vitamins-Minerals)  13)  B Complex  Tabs (B Complex Vitamins) 14)  Oyst-Cal D 250-125 Mg-Unit Tabs (Calcium Carbonate-Vitamin D) 15)  Hydralazine Hcl 25 Mg Tabs (Hydralazine Hcl) .Marland Kitchen.. 1 By Mouth Three Times A Day For Blood Pressure 16)  Carvedilol 25 Mg Tabs (Carvedilol) .Marland Kitchen.. 1 By Mouth Two Times A Day For Blood Pressure.  Replaces Cardiazem and Metoprolol. 17)  Nexium 40 Mg Cpdr (Esomeprazole Magnesium) .... Take 1 Tab Each Morning 18)  Hydrocortisone 2.5 % Oint (Hydrocortisone) .... Apply To Rash Two Times A Day For 10 Day Then As Needed, 60 Gm 19)  Isosorbide Mononitrate Cr 60 Mg Xr24h-Tab (Isosorbide Mononitrate) .... Once Daily Per Cards 20)  Diltiazem Hcl 30 Mg Tabs (Diltiazem Hcl) .... Two Times A Day Per Cards  Allergies (verified): 1)  ! Acetyl Salicylic Acid (Aspirin) 2)  ! Nsaids  Past History:  Past medical, surgical, family and social histories (including risk factors) reviewed, and no changes noted (except as noted  below).  Past Medical History: Reviewed history from 04/09/2010 and no changes required. *******ASA/ NSAIDS CONTRAINDICATED ( recurrent GI bleed, see below) -- PANDIVERTICULOSIS ( Colonoscopy Dr. Arlyce Dice in 2007) --Gastrointestinal hemorrhage:   1- hx of  GI  bleed when on NSAIDS (rectal bleeding x2  while on  tx  for knee                                                                     pain in 2002)                                                    2-- -GI BLEED RECURRENT  x4 :  2 to colon diverticula (11/03/05 x2).                                                                                F/u with Surgery: Dr. 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  -creatinine 0.7 10/06 ( 2+ microalbumin 10/06) POSTMENOPAUSAL STATUS (ICD-V49.81) VENOUS INSUFFICIENCY, CHRONIC (ICD-459.81)  Past Surgical History: Reviewed history from 09/15/2008 and no changes required. -Abd. 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 -Colonoscopy (difuse diverticulosis) - 03/01/2006 Dr. Arlyce Dice -EGD:  normal ( 03/2006) - CT - Head 10/06 (L putamen infarct) - 10/28/2005 - Echo EF: 60 % mild/mod LVH - 08/19/2005, - exercise cardiolite(EF 67%/nl perfusion) - 10/01/2005 - hysterectomy - complete   95 - 07/21/2004 - knee replacement bilat. 98 - 07/21/2004  Family History: Reviewed history  from 04/09/2010 and no changes required. Colon CA 1st degree, Diabetes 1st degree, hypertension, stroke HTN ( brother) DM (brother) Mom died at 65 yo 2 to DM. Father died 2 to colon CA in  1980's. mental retardation in sisters  Social History: Reviewed history from 04/09/2010 and no changes required. Awaiting for disability. Lives with 2  MR sisters ( she takes care of them by her self - one is patient here - Tish Begin )   Quit smoking THC in 2000  Currently no tobacco abuse. Quit in  1999 (smoked everyday during 22 years). Used  to go to [employer] for weight loss. Currently doing water aerobics or going to [employer]  to lose weight.  5 Brothers  (1948) , 1949 living in [place], [place] in [place], [place] in [place] in [place].  3 sisters 21, 69 ( both MR) now living with her , and 65 in [place]  Review of Systems       per HPI  Physical Exam  General:  alert, morbidly obese VS reviewed Msk:  bilateral nearly complete collapse of both transverse and longitudinal arches of both feet.  mild pain under metatarsal heads of right foot.  mild swelling of bilateral lower extremities but no difference in swelling over dorsum of foot.  nontender to palpation over dorsum of foot.  no erythema or warmth.  noted scar on right great toe from bunion removal in past.  Pulses:  1+ DP pulses bilaterally   Impression & Recommendations:  Problem # 1:  FOOT PAIN, BILATERAL (ICD-729.5) Assessment New  I do not think this is diabetic foot pain This could be related to her back but I think this is also unlikely I suspect likely this is related to her pes planus and her weight.  see pt instructions if not improving with this consider sports medicine referral for eval for possible orthotics  Orders: [facility]- Est Level  3 ([address])  Problem # 2:  OBESITY, NOS (ICD-278.00) Assessment: Comment Only consider split night sleep study if partner confirms any symptoms suggestive of this.    Complete Medication  List: 1)  Accupril 40 Mg Tabs (Quinapril hcl) .... Take 1 tablet by mouth once a day 2)  Clonidine Hcl 0.2 Mg Tab (Clonidine hcl) .... Take 1 tablet by mouth twice a day 3)  Furosemide 20 Mg Tabs (Furosemide) .... Take 1 tab by mouth daily as need for swelling. 4)  Metformin Hcl 500 Mg Tabs (Metformin hcl) .... Take 1 tablet by mouth twice a day 5)  Tylenol Arthritis Pain 650 Mg Cr-tabs (Acetaminophen) .Marland Kitchen.. 1 by mouth two times a day to three times a day as needed pain 6)  Colace 100 Mg Caps (Docusate sodium) .... Take 1 by mouth qd 7)  Ferrous Sulfate 325 (65 Fe) Mg Tabs (Ferrous sulfate) .... Take 1 by mouth tid 8)  Polyethylene Glycol 3350 Powd (Polyethylene glycol 3350) .... Dissolve 17 g in 4-8 oz of water of juice bid 9)  Nitroglycerin 0.4 Mg Subl (Nitroglycerin) .Marland Kitchen.. 1 tab sublingual q 5 minutes as needed chest pain.  if 3 doses does not relieve pain, call 911 10)  Spironolactone 50 Mg Tabs (Spironolactone) .Marland Kitchen.. 1 by  mouth once daily for blood pressure 11)  Ambien 10 Mg Tabs (Zolpidem tartrate) .... 1/2 - 1 by mouth at bedtime as needed insomnia 12)  Centrum Silver Tabs (Multiple vitamins-minerals) 13)  B Complex Tabs (B complex vitamins) 14)  Oyst-cal D 250-125 Mg-unit Tabs (Calcium carbonate-vitamin d) 15)  Hydralazine Hcl 25 Mg Tabs (Hydralazine hcl) .Marland Kitchen.. 1 by mouth three times a day for blood pressure 16)  Carvedilol 25 Mg Tabs (Carvedilol) .Marland Kitchen.. 1 by mouth two times a day for blood pressure.  replaces cardiazem and metoprolol. 17)  Nexium 40 Mg Cpdr (Esomeprazole magnesium) .... Take 1 tab each morning 18)  Hydrocortisone 2.5 % Oint (Hydrocortisone) .... Apply to rash two times a day for 10 day then as needed, 60 gm 19)  Isosorbide Mononitrate Cr 60 Mg Xr24h-tab (Isosorbide mononitrate) .... Once daily per cards 20)  Diltiazem Hcl 30 Mg Tabs (Diltiazem hcl) .... Two times a day per cards  Patient Instructions: 1)  Continue to work on weight loss. You could also consider looking into  water aerobics (they have a class at the [facility]) which is easy on the joints. 2)  Use tylenol as needed for your feet.  Also you can try some OTC rubs for your feet. 3)  I would wear supportive shoes for the next 1-2 weeks regularly to see if this helps your feet and I would use your water pill as well to help keep the swelling down.   4)  For your back you can try capsacian cream/rub.   5)  Be sure to mention to Dr Eulah Pont about your feet as well. Prescriptions: AMBIEN 10 MG TABS (ZOLPIDEM TARTRATE) 1/2 - 1 by mouth at bedtime as needed insomnia  #20 x 0   Entered and Authorized by:   Ancil Boozer  MD   Signed by:   Ancil Boozer  MD on 05/11/2010   Method used:   Handwritten   RxID:   [id number]

## 2010-12-01 NOTE — Consult Note (Signed)
Summary: Algie Coffer MD - ECHO w/ diastolic dysfxn  Algie Coffer MD   Imported By: Bradly Bienenstock 02/04/2010 10:37:23  _____________________________________________________________________  External Attachment:    Type:   Image     Comment:   External Document

## 2010-12-01 NOTE — Letter (Signed)
Summary: Generic Letter  [facility]  [address]   [place], [place] [address]   Phone: [phone]  Fax: [phone]    04/08/2010  [facility] [address] [place], [place]  [address]  Dear Ms. Grizzell,   All of your blood tests were normal including your red blood cells, your thyroid, your B12 and your vitamin D level.  You can decrease your iron dose to just once a day.  If you have questions feel free to call the family practice center.     Sincerely,   Ancil Boozer  MD  Appended Document: Generic Letter mailed.

## 2010-12-01 NOTE — Letter (Signed)
Summary: Lipid Letter  [facility]  [address]   [place], [place] [address]   Phone: [phone]  Fax: [phone]    01/20/2010  [facility] [address] [place], [place]  [address]  Dear Jacqualyn Posey:  We have carefully reviewed your last lipid profile from 01/19/2010 and the results are noted below with a summary of recommendations for lipid management.    Cholesterol:       149     Goal: < 200   HDL "good" Cholesterol:   63     Goal: > 40   LDL "bad" Cholesterol:   73     Goal: < 100   Triglycerides:       64     Goal: < 150    Overall these numbers are great thanks to you taking your medications as prescribed, exercising and watching your diet.  Your other blood tests looking at your kidneys, liver and blood counts were also very good.  Below are some more ideas to help keep it that way.    TLC Diet (Therapeutic Lifestyle Change): Saturated Fats & Transfatty acids should be kept < 7% of total calories ***Reduce Saturated Fats Polyunstaurated Fat can be up to 10% of total calories Monounsaturated Fat Fat can be up to 20% of total calories Total Fat should be no greater than 25-35% of total calories Carbohydrates should be 50-60% of total calories Protein should be approximately 15% of total calories Fiber should be at least 20-30 grams a day ***Increased fiber may help lower LDL Total Cholesterol should be < 200mg /day Consider adding plant stanol/sterols to diet (example: Benacol spread) ***A higher intake of unsaturated fat may reduce Triglycerides and Increase HDL    Adjunctive Measures (may lower LIPIDS and reduce risk of Heart Attack) include: Aerobic Exercise (20-30 minutes 3-4 times a week) Limit Alcohol Consumption Weight Reduction Aspirin 75-81 mg a day by mouth (if not allergic or contraindicated) Dietary Fiber 20-30 grams a day by mouth     Current Medications: 1)    Accupril 40 Mg Tabs (Quinapril hcl) .... Take 1 tablet by mouth  once a day 2)    Clonidine Hcl 0.2 Mg Tab (Clonidine hcl) .... Take 1 tablet by mouth twice a day 3)    Furosemide 20 Mg Tabs (Furosemide) .... Take 1 tab by mouth daily as need for swelling. 4)    Metformin Hcl 500 Mg Tabs (Metformin hcl) .... Take 1 tablet by mouth twice a day 5)    Tylenol Arthritis Pain 650 Mg Cr-tabs (Acetaminophen) .Marland Kitchen.. 1 by mouth two times a day to three times a day as needed pain 6)    Colace 100 Mg  Caps (Docusate sodium) .... Take 1 by mouth qd 7)    Ferrous Sulfate 325 (65 Fe) Mg  Tabs (Ferrous sulfate) .... Take 1 by mouth tid 8)    Polyethylene Glycol 3350  Powd (Polyethylene glycol 3350) .... Dissolve 17 g in 4-8 oz of water of juice bid 9)    Nitroglycerin 0.4 Mg Subl (Nitroglycerin) .Marland Kitchen.. 1 tab sublingual q 5 minutes as needed chest pain.  if 3 doses does not relieve pain, call 911 10)    Spironolactone 50 Mg Tabs (Spironolactone) .Marland Kitchen.. 1 by mouth once daily for blood pressure 11)    Ambien 10 Mg Tabs (Zolpidem tartrate) .... 1/2 - 1 by mouth at bedtime as needed insomnia 12)    Centrum Silver  Tabs (Multiple vitamins-minerals) 13)  B Complex  Tabs (B complex vitamins) 14)    Oyst-cal D 250-125 Mg-unit Tabs (Calcium carbonate-vitamin d) 15)    Hydralazine Hcl 25 Mg Tabs (Hydralazine hcl) .Marland Kitchen.. 1 by mouth three times a day for blood pressure 16)    Carvedilol 25 Mg Tabs (Carvedilol) .Marland Kitchen.. 1 by mouth two times a day for blood pressure.  replaces cardiazem and metoprolol. 17)    Nexium 40 Mg Cpdr (Esomeprazole magnesium) .... Take 1 tab each morning 18)    Hydrocortisone 2.5 % Oint (Hydrocortisone) .... Apply to rash two times a day for 10 day then as needed, 60 gm 19)    Isosorbide Mononitrate Cr 60 Mg Xr24h-tab (Isosorbide mononitrate) .... Once daily per cards 20)    Diltiazem Hcl 30 Mg Tabs (Diltiazem hcl) .... Two times a day per cards  If you have any questions, please call. We appreciate being able to work with you.   Sincerely,    [facility] Ancil Boozer  MD  Appended Document: Lipid Letter mailed

## 2010-12-01 NOTE — Miscellaneous (Signed)
  Medications Added NEXIUM 40 MG CPDR (ESOMEPRAZOLE MAGNESIUM) Take 1 tab each morning       Clinical Lists Changes  Medications: Removed medication of OMEPRAZOLE 40 MG CPDR (OMEPRAZOLE) 1 by mouth once daily as needed reflux. Added new medication of NEXIUM 40 MG CPDR (ESOMEPRAZOLE MAGNESIUM) Take 1 tab each morning - Signed Rx of NEXIUM 40 MG CPDR (ESOMEPRAZOLE MAGNESIUM) Take 1 tab each morning;  #30 x 11;  Signed;  Entered by: Pearlean Brownie MD;  Authorized by: Pearlean Brownie MD;  Method used: Telephoned to [id number] [address], [place], [place], [place]  [address], Ph: [phone], Fax: [phone]    Prescriptions: NEXIUM 40 MG CPDR (ESOMEPRAZOLE MAGNESIUM) Take 1 tab each morning  #30 x 11   Entered and Authorized by:   Pearlean Brownie MD   Signed by:   Pearlean Brownie MD on 11/14/2009   Method used:   Telephoned to ...       [id number] (retail)       [address]       [place], [place]  [address]       Ph: [phone]       Fax: [phone]   RxID:   [id number]

## 2010-12-02 ENCOUNTER — Encounter: Payer: Medicaid Other | Admitting: Physical Therapy

## 2010-12-02 ENCOUNTER — Encounter: Payer: Self-pay | Admitting: Physical Therapy

## 2010-12-03 NOTE — Progress Notes (Signed)
  Phone Note Call from Patient   Caller: Patient Call For: [phone] Summary of Call: Ms. Tisdell would like you to call her back regarding about her pressure Initial call taken by: Abundio Miu,  November 10, 2010 2:01 PM  Follow-up for Phone Call        states she has problems at night & thinks her bp drops. has a machine. 86/75 range . But in ams 110/80. takes it standing & sitting.  has to sit for a while before she can stand up due to dizziness  uses [facility] [address]  asked her to come in to get bp checked. unable to come until Friday. message to md to see if changes need to be made . told pt I will call her if md makes med changes Follow-up by: Golden Circle RN,  November 11, 2010 11:17 AM  Additional Follow-up for Phone Call Additional follow up Details #1::        pt seen in clinic today Additional Follow-up by: Dessa Phi MD,  November 16, 2010 12:19 PM

## 2010-12-03 NOTE — Miscellaneous (Signed)
Summary: triage  Clinical Lists Changes patient walks in wanting to get Soma filled that MD gave ger Rx for in Sept. she never had it filled and now pharmacy is stating insurance company is needing PA. she is in much discomfort with left hip and down leg. states pain started on 10/07/2010. she is teary. advised patient that she needs to be seen  because this problem sounds like a different problem that what she was here for when Tresa Garter was given.  appointment scheduled today at 3;00 for work in. patient has to pick up her sister from adult daycare and this is the earliest she can get here. Theresia Lo RN  October 12, 2010 12:11 PM

## 2010-12-03 NOTE — Assessment & Plan Note (Signed)
Summary: pain  left hip down leg/ls   Vital Signs:  Patient profile:   55 year old female Height:      64.75 inches Weight:      294.50 pounds BMI:     49.57 BSA:     2.33 Temp:     98.4 degrees F Pulse rate:   80 / minute BP sitting:   171 / 104  Vitals Entered By: Jone Baseman CMA (October 12, 2010 4:42 PM) CC: Left leg and hip pain x 1 week Is Patient Diabetic? No Pain Assessment Patient in pain? yes     Location: left leg and hip Intensity: 10   Primary Care Arryn Terrones:  Alvia Grove DO  CC:  Left leg and hip pain x 1 week.  History of Present Illness: 55 yo female here for work-in appt, due to left hip pain. Started about 7 days ago.  Pain is in hip area, feels like a sharp pain.  Never had this before.  Pt does report worsening pain when she lays down.  No fevers, no s/s of infection.   Habits & Providers  Alcohol-Tobacco-Diet     Tobacco Status: never     Year Quit: ?  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  (ICD-280.9) 16)  Obesity, Nos  (ICD-278.00) 17)  Foot Pain, Bilateral  (ICD-729.5) 18)  Postmenopausal Status  (ICD-V49.81) 19)  Libido, Decreased  (ICD-799.81) 20)  Cyst, Kidney, Acquired  (ICD-593.2)  Current Medications (verified): 1)  Clonidine Hcl 0.2 Mg Tab (Clonidine  Hcl) .... Take 1 Tablet By Mouth Twice A Day 2)  Furosemide 20 Mg Tabs (Furosemide) .... Take 1 Tab By Mouth Daily As Need For Swelling. 3)  Metformin Hcl 500 Mg Tabs (Metformin Hcl) .... Take 1 Tablet By Mouth Twice A Day 4)  Tylenol Arthritis Pain 650 Mg Cr-Tabs (Acetaminophen) .Marland Kitchen.. 1 By Mouth Two Times A Day To Three Times A Day As Needed Pain 5)  Colace 100 Mg  Caps (Docusate Sodium) .... Take 1 By Mouth Qd 6)  Ferrous Sulfate 325 (65 Fe) Mg  Tabs (Ferrous Sulfate) .... Take 1 By Mouth Tid 7)  Polyethylene Glycol 3350  Powd (Polyethylene Glycol 3350) .... Dissolve 17 G in 4-8 Oz of Water of Juice Bid 8)  Nitroglycerin 0.4 Mg Subl (Nitroglycerin) .Marland Kitchen.. 1 Tab Sublingual Q 5 Minutes As Needed Chest Pain.  If 3 Doses Does Not Relieve Pain, Call 911 9)  Spironolactone 50 Mg Tabs (Spironolactone) .Marland Kitchen.. 1 By Mouth Once Daily For Blood Pressure 10)  Ambien 10 Mg Tabs (Zolpidem Tartrate) .... 1/2 - 1 By Mouth At Bedtime As Needed Insomnia 11)  Centrum Silver  Tabs (Multiple Vitamins-Minerals) 12)  B Complex  Tabs (B Complex Vitamins) 13)  Oyst-Cal D 250-125 Mg-Unit Tabs (Calcium Carbonate-Vitamin D) 14)  Hydralazine Hcl 25 Mg Tabs (Hydralazine Hcl) .Marland Kitchen.. 1 By Mouth Three Times A Day  For Blood Pressure 15)  Carvedilol 25 Mg Tabs (Carvedilol) .Marland Kitchen.. 1 By Mouth Two Times A Day For Blood Pressure.  Replaces Cardiazem and Metoprolol. 16)  Nexium 40 Mg Cpdr (Esomeprazole Magnesium) .... Take 1 Tab Each Morning 17)  Hydrocortisone 2.5 % Oint (Hydrocortisone) .... Apply To Rash Two Times A Day For 10 Day Then As Needed, 60 Gm 18)  Isosorbide Mononitrate Cr 60 Mg Xr24h-Tab (Isosorbide Mononitrate) .... Once Daily Per Cards 19)  Diltiazem Hcl 30 Mg Tabs (Diltiazem Hcl) .... Two Times A Day Per Cards 20)  Fexofenadine Hcl 180 Mg Tabs (Fexofenadine Hcl) .... Sig Take 1 Tab By Mouth One Time Daily Disp 30 21)  Flonase 50 Mcg/act Susp (Fluticasone Propionate) .... Sig: 2 Sprays/nostril One Time Daily in The Morning  After Nasal Rinse Disp 1 Unit 22)  Oxycodone Hcl 5 Mg Tabs (Oxycodone Hcl) .... Take 1 Pill By Mouth Every 4hours As Needed For Severe Pain  Allergies (verified): 1)  ! Acetyl Salicylic Acid (Aspirin) 2)  ! Nsaids  Past History:  Past Medical History: Last updated: 08/26/2010 *******ASA/ NSAIDS CONTRAINDICATED ( recurrent GI bleed, see below)    -- PANDIVERTICULOSIS ( Colonoscopy Dr. Arlyce Dice in 2007)    --Gastrointestinal hemorrhage:      1- hx of  GI  bleed when on NSAIDS (rectal bleeding x2  while on  tx  for knee pain in 2002)    2 - GI BLEED RECURRENT x 4 : 2/2  to colon diverticula (11/03/05 x2). F/u with Surgery: Dr. Purnell Shoemaker. 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  - Creatinine 0.7 10/06 ( 2+ microalbumin 10/06) - POSTMENOPAUSAL STATUS (ICD-V49.81) - VENOUS INSUFFICIENCY, CHRONIC (ICD-459.81)  Past Surgical History: Last updated: 08/26/2010 - Abd. 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 - Colonoscopy (difuse diverticulosis) - 03/01/2006 Dr. Arlyce Dice - EGD: normal ( 03/2006) - CT - Head 10/06 (L putamen infarct) - 10/28/2005 - Echo EF: 60 % mild/mod LVH - 08/19/2005, - exercise cardiolite(EF 67%/nl perfusion) - 10/01/2005 - hysterectomy - complete   95 - 07/21/2004 - knee replacement bilat. 98 - 07/21/2004  Family History: Last updated:  04/09/2010 Colon CA 1st degree, Diabetes 1st degree, hypertension, stroke HTN ( brother) DM (brother) Mom died at 37 yo 2 to DM. Father died 2 to colon CA in  1980's. mental retardation in sisters  Social History: Last updated: 04/09/2010 Awaiting for disability. Lives with 2  MR sisters ( she takes care of them by her self - one is patient here - Louie Meaders )   Quit smoking THC in 2000  Currently no tobacco abuse. Quit in  1999 (smoked everyday during 22 years). Used  to go to [employer] for weight loss. Currently doing water aerobics or going to [employer]  to lose weight.  5 Brothers  (1948) , 1949 living in [place], [place]  in [place], [place] in [place] , 1962 in [place].  3 sisters 26, 31 ( both MR) now living with her , and 39 in [place]  Risk Factors: Smoking Status: never (10/12/2010)  Review of Systems       see hpi  Physical Exam  General:  Well-developed,well-nourished,in no acute distress; alert,appropriate and cooperative throughout examination Lungs:  Normal respiratory effort, chest expands symmetrically. Lungs are clear to auscultation, no crackles or wheezes. Heart:  Normal rate and regular rhythm. S1 and S2 normal without gallop, murmur, click, rub or other extra sounds.   Hip Exam  General:    Well-developed,well-nourished,normal body habitus;no deformities, normal grooming.  Gait:    shuffling.    Skin:    Intact, no scars,lesions,rashes,cafe au lait spots,bruising.  Inspection:    No deformity, ecchymosis or swelling.   Vascular:    There was no swelling or varicose veins.The pulses and temperature is normal.There was no edema or tenderness.  Motor:    Motor strength 5/5 bilaterally for quadriceps, hamstrings, ankle dorsiflexion, ankle plantar flexion, .  Reflexes:    Normal and symmetric patellar and Achilles reflexes bilaterally.     Impression & Recommendations:  Problem # 1:  HIP PAIN, LEFT (ICD-719.45) see pt instructions reviewed red flags for urgent  follow up. Pt to follow up in 1 week with me unless pain worsenes, then instructed to go to ED Her updated medication list for this problem includes:    Tylenol Arthritis Pain 650 Mg Cr-tabs (Acetaminophen) .Marland Kitchen... 1 by mouth two times a day to three times a day as needed pain    Oxycodone Hcl 5 Mg Tabs (Oxycodone hcl) .Marland Kitchen... Take 1 pill by mouth every 4hours as needed for severe pain  Orders: [facility]- Est Level  3 (86578) MRI (MRI)  Complete Medication List: 1)  Clonidine Hcl 0.2 Mg Tab (Clonidine hcl) .... Take 1 tablet by mouth twice a day 2)  Furosemide 20 Mg Tabs (Furosemide) .... Take 1 tab by mouth daily as need for swelling. 3)  Metformin Hcl 500 Mg Tabs (Metformin hcl) .... Take 1 tablet by mouth twice a day 4)  Tylenol Arthritis Pain 650 Mg Cr-tabs (Acetaminophen) .Marland Kitchen.. 1 by mouth two times a day to three times a day as needed pain 5)  Colace 100 Mg Caps (Docusate sodium) .... Take 1 by mouth qd 6)  Ferrous Sulfate 325 (65 Fe) Mg Tabs (Ferrous sulfate) .... Take 1 by mouth tid 7)  Polyethylene Glycol 3350 Powd (Polyethylene glycol 3350) .... Dissolve 17 g in 4-8 oz of water of juice bid 8)  Nitroglycerin 0.4 Mg Subl (Nitroglycerin) .Marland Kitchen.. 1 tab sublingual q 5 minutes as needed chest pain.  if 3 doses does not relieve pain, call 911 9)  Spironolactone 50 Mg Tabs (Spironolactone) .Marland Kitchen.. 1 by mouth once daily for blood pressure 10)  Ambien 10 Mg Tabs (Zolpidem tartrate) .... 1/2 - 1 by mouth at bedtime as needed insomnia 11)  Centrum Silver Tabs (Multiple vitamins-minerals) 12)  B Complex Tabs (B complex vitamins) 13)  Oyst-cal D 250-125 Mg-unit Tabs (Calcium carbonate-vitamin d) 14)  Hydralazine Hcl 25 Mg Tabs (Hydralazine hcl) .Marland Kitchen.. 1 by mouth three times a day for blood pressure 15)  Carvedilol 25 Mg Tabs (Carvedilol) .Marland Kitchen.. 1 by mouth two times a day for blood pressure.  replaces cardiazem and metoprolol. 16)  Nexium 40 Mg Cpdr (Esomeprazole magnesium) .... Take 1 tab each morning 17)   Hydrocortisone 2.5 % Oint (Hydrocortisone) .... Apply to rash two  times a day for 10 day then as needed, 60 gm 18)  Isosorbide Mononitrate Cr 60 Mg Xr24h-tab (Isosorbide mononitrate) .... Once daily per cards 19)  Diltiazem Hcl 30 Mg Tabs (Diltiazem hcl) .... Two times a day per cards 20)  Fexofenadine Hcl 180 Mg Tabs (Fexofenadine hcl) .... Sig take 1 tab by mouth one time daily disp 30 21)  Flonase 50 Mcg/act Susp (Fluticasone propionate) .... Sig: 2 sprays/nostril one time daily in the morning after nasal rinse disp 1 unit 22)  Oxycodone Hcl 5 Mg Tabs (Oxycodone hcl) .... Take 1 pill by mouth every 4hours as needed for severe pain  Patient Instructions: 1)  Asha will call you with your appointment for the MRI. 2)  Take the oxycodone for the pain. 3)  Please schedule a follow up appt with me on the 19th. Prescriptions: OXYCODONE HCL 5 MG TABS (OXYCODONE HCL) take 1 pill by mouth every 4hours as needed for severe pain  #45 x 0   Entered and Authorized by:   Alvia Grove DO   Signed by:   Alvia Grove DO on 10/12/2010   Method used:   Print then Give to Patient   RxID:   [id number]    Orders Added: 1)  [facility]- Est Level  3 [14782] 2)  MRI [MRI]

## 2010-12-03 NOTE — Assessment & Plan Note (Signed)
Summary: 2 WKS F/U PER MD/RH   Vital Signs:  Patient profile:   55 year old female Height:      64.75 inches Weight:      294.13 pounds BMI:     49.50 BSA:     2.33 Temp:     98.7 degrees F Pulse (ortho):   90 / minute BP standing:   129 / 88  Vitals Entered By: Jone Baseman CMA (November 16, 2010 8:48 AM)  Serial Vital Signs/Assessments:  Time      Position  BP       Pulse  Resp  Temp     By 8:49 AM   Lying LA  138/90   83                    Jessica Fleeger CMA 8:49 AM   Sitting   137/87   83                    Jessica Fleeger CMA 8:49 AM   Standing  129/88   90                    Jessica Fleeger CMA   Primary Provider:  Dessa Phi MD  CC:  dizziness and low BPs.  History of Present Illness: Pt here to discuss feeling light-headed and dizzy associated with low BP readings on her home monitor.  Dizziness-Has come and gone for the past few months. Associated with weaknessand low readings on BP monitor. Last episdoes  occured on the 10th. Comes on suddenly. Lat a few minutes.  Not associated with specific times or particular activities, but pt does feel that the episodes are more frequent at night. Maybe symptomatic on and off for  1-7 days, she will then be assymptomatic for 2-3 weeks. Pt reports feeling nauseated during episodes. She denies vomiting, diarrhea, fluid restriction, urinary frequency, SOB, she admits to ocassional L sided CP at rest, lasting a few seconds. She takes her medications as prescribed. She did take her meds this AM. She admits to dieting to achieve weightloss x 1 mos.   Diabetes: Pt is diet controlled. Random CBGS 110s. She denies polyphagia, polydypsia.   Back pain:  Resolved on L side. Present on R side when pt lays on L sides. 3/10. Pt happy with current level of pain. She took Gabapentin for 3 days then d/c bc she did not completely understand the instructions.    Current Medications (verified): 1)  Clonidine Hcl 0.2 Mg Tab (Clonidine Hcl)  .... Take 1 Tablet By Mouth Twice A Day 2)  Furosemide 20 Mg Tabs (Furosemide) .... Take 1 Tab By Mouth Daily As Need For Swelling. 3)  Metformin Hcl 500 Mg Tabs (Metformin Hcl) .... Take 1 Tablet By Mouth Twice A Day 4)  Tylenol Arthritis Pain 650 Mg Cr-Tabs (Acetaminophen) .Marland Kitchen.. 1 By Mouth Two Times A Day To Three Times A Day As Needed Pain 5)  Colace 100 Mg  Caps (Docusate Sodium) .... Take 1 By Mouth Qd 6)  Ferrous Sulfate 325 (65 Fe) Mg  Tabs (Ferrous Sulfate) .... Take 1 By Mouth Tid 7)  Polyethylene Glycol 3350  Powd (Polyethylene Glycol 3350) .... Dissolve 17 G in 4-8 Oz of Water of Juice Bid 8)  Nitroglycerin 0.4 Mg Subl (Nitroglycerin) .Marland Kitchen.. 1 Tab Sublingual Q 5 Minutes As Needed Chest Pain.  If 3 Doses Does Not Relieve Pain, Call 911 9)  Spironolactone 50 Mg Tabs (Spironolactone) .Marland KitchenMarland KitchenMarland Kitchen  1 By Mouth Once Daily For Blood Pressure 10)  Ambien 10 Mg Tabs (Zolpidem Tartrate) .... 1/2 - 1 By Mouth At Bedtime As Needed Insomnia 11)  Centrum Silver  Tabs (Multiple Vitamins-Minerals) 12)  B Complex  Tabs (B Complex Vitamins) 13)  Oyst-Cal D 250-125 Mg-Unit Tabs (Calcium Carbonate-Vitamin D) 14)  Hydralazine Hcl 25 Mg Tabs (Hydralazine Hcl) .Marland Kitchen.. 1 By Mouth Three Times A Day For Blood Pressure 15)  Carvedilol 25 Mg Tabs (Carvedilol) .Marland Kitchen.. 1 By Mouth Two Times A Day For Blood Pressure.  Replaces Cardiazem and Metoprolol. 16)  Nexium 40 Mg Cpdr (Esomeprazole Magnesium) .... Take 1 Tab Each Morning 17)  Hydrocortisone 2.5 % Oint (Hydrocortisone) .... Apply To Rash Two Times A Day For 10 Day Then As Needed, 60 Gm 18)  Isosorbide Mononitrate Cr 60 Mg Xr24h-Tab (Isosorbide Mononitrate) .... Once Daily Per Cards 19)  Diltiazem Hcl 30 Mg Tabs (Diltiazem Hcl) .... Two Times A Day Per Cards 20)  Fexofenadine Hcl 180 Mg Tabs (Fexofenadine Hcl) .... Sig Take 1 Tab By Mouth One Time Daily Disp 30 21)  Flonase 50 Mcg/act Susp (Fluticasone Propionate) .... Sig: 2 Sprays/nostril One Time Daily in The Morning After  Nasal Rinse Disp 1 Unit 22)  Oxycodone Hcl 5 Mg Tabs (Oxycodone Hcl) .... Take 1 Pill By Mouth Every 4hours As Needed For Severe Pain 23)  Gabapentin 100 Mg Caps (Gabapentin) .... One Tab Three Times A Day For Three Days, Then Two Tabs Three Times A Day For 3 Days, Then Three Tab Three Times A Day.  Allergies: 1)  ! Acetyl Salicylic Acid (Aspirin) 2)  ! Nsaids  Review of Systems       as per HPI  Physical Exam  General:  Well-developed,well-nourished, obese body habitus;no deformities, normal grooming. Mouth:  MMM. fair dentition.   Lungs:  Normal respiratory effort, chest expands symmetrically. Lungs are clear to auscultation, no crackles or wheezes. Heart:  Normal rate and regular rhythm. S1 and S2 normal without gallop, murmur, click, rub or other extra sounds. Neurologic:  alert & oriented X3, cranial nerves II-XII intact, strength normal in all extremities, sensation intact to light touch, gait normal.   Impression & Recommendations:  Problem # 1:  UNSPECIFIED HYPOTENSION (ICD-458.9) Assessment New Othostatic vital signs normal. A1c 5.8. No report of extremely low CBGs per pt. Likely medication induced at pt is on multiple anti-hypertensives. Plan to hold  3rd dose of hydralazine. If symptoms are still present hold  2nd dose diltiazem.   Have pt bring BP monitor at next visit.  See pt instructions for details.  Orders: [facility]- Est  Level 4 (56213)  Problem # 2:  DIAB W/OTH MANIFESTS TYPE II/UNS NOT UNCNTRL (ICD-250.80) Assessment: Improved A1c 5.8. Very well controlled. Decreasing metformin to once daily.   Her updated medication list for this problem includes:    Metformin Hcl 500 Mg Tabs (Metformin hcl) .Marland Kitchen... Take 1 tablet by mouth daily.  Orders: A1C-[facility] (08657) [facility]- Est  Level 4 (99214)  Problem # 3:  LOW BACK PAIN, ACUTE (ICD-724.2) Assessment: Improved Pain is b/l now worse on R, but tolerable. Her updated medication list for this problem includes:    Tylenol  Arthritis Pain 650 Mg Cr-tabs (Acetaminophen) .Marland Kitchen... 1 by mouth two times a day to three times a day as needed pain    Oxycodone Hcl 5 Mg Tabs (Oxycodone hcl) .Marland Kitchen... Take 1 pill by mouth every 4hours as needed for severe pain  Orders: [facility]- Est  Level 4 (  11914)  Complete Medication List: 1)  Clonidine Hcl 0.2 Mg Tab (Clonidine hcl) .... Take 1 tablet by mouth twice a day 2)  Furosemide 20 Mg Tabs (Furosemide) .... Take 1 tab by mouth daily as need for swelling. 3)  Metformin Hcl 500 Mg Tabs (Metformin hcl) .... Take 1 tablet by mouth daily. 4)  Tylenol Arthritis Pain 650 Mg Cr-tabs (Acetaminophen) .Marland Kitchen.. 1 by mouth two times a day to three times a day as needed pain 5)  Colace 100 Mg Caps (Docusate sodium) .... Take 1 by mouth qd 6)  Ferrous Sulfate 325 (65 Fe) Mg Tabs (Ferrous sulfate) .... Take 1 by mouth tid 7)  Polyethylene Glycol 3350 Powd (Polyethylene glycol 3350) .... Dissolve 17 g in 4-8 oz of water of juice bid 8)  Nitroglycerin 0.4 Mg Subl (Nitroglycerin) .Marland Kitchen.. 1 tab sublingual q 5 minutes as needed chest pain.  if 3 doses does not relieve pain, call 911 9)  Spironolactone 50 Mg Tabs (Spironolactone) .Marland Kitchen.. 1 by mouth once daily for blood pressure 10)  Ambien 10 Mg Tabs (Zolpidem tartrate) .... 1/2 - 1 by mouth at bedtime as needed insomnia 11)  Centrum Silver Tabs (Multiple vitamins-minerals) 12)  B Complex Tabs (B complex vitamins) 13)  Oyst-cal D 250-125 Mg-unit Tabs (Calcium carbonate-vitamin d) 14)  Hydralazine Hcl 25 Mg Tabs (Hydralazine hcl) .Marland Kitchen.. 1 by mouth three times a day for blood pressure 15)  Carvedilol 25 Mg Tabs (Carvedilol) .Marland Kitchen.. 1 by mouth two times a day for blood pressure.  replaces cardiazem and metoprolol. 16)  Nexium 40 Mg Cpdr (Esomeprazole magnesium) .... Take 1 tab each morning 17)  Hydrocortisone 2.5 % Oint (Hydrocortisone) .... Apply to rash two times a day for 10 day then as needed, 60 gm 18)  Isosorbide Mononitrate Cr 60 Mg Xr24h-tab (Isosorbide mononitrate)  .... Once daily per cards 19)  Diltiazem Hcl 30 Mg Tabs (Diltiazem hcl) .... Two times a day per cards 20)  Fexofenadine Hcl 180 Mg Tabs (Fexofenadine hcl) .... Sig take 1 tab by mouth one time daily disp 30 21)  Flonase 50 Mcg/act Susp (Fluticasone propionate) .... Sig: 2 sprays/nostril one time daily in the morning after nasal rinse disp 1 unit 22)  Oxycodone Hcl 5 Mg Tabs (Oxycodone hcl) .... Take 1 pill by mouth every 4hours as needed for severe pain 23)  Gabapentin 100 Mg Caps (Gabapentin) .... One tab three times a day for three days, then two tabs three times a day for 3 days, then three tab three times a day. as needed for back pain. currently not taking.  Other Orders: Miscellaneous Lab Charge-[facility] [phone] [id number])  Patient Instructions: 1)  Mrs. Picker, 2)  Thank you for coming in today. 3)  Your BPs and exam is good today. In the future if you symptoms come back I want you to, 4)  1. Call the clinic w/ BP readings and HR.  5)  2. Do not take 3rd dose of hydralazine. If symptoms are still present do not take 2nd dose of diltiazem.  6)  Your A1c is 5.8 today. I am changing your metformin to once daily. Check your fasting (before breaksfast blood sugars 2-3x/week) and let me know how they are at your next visit.  7)  PT appt: 11/25/10 at 8 AM. 8)  f/u in 1 mos or sooner if needed.  9)  -Dr. Armen Pickup    Orders Added: 1)  A1C-[facility] [83036] 2)  Miscellaneous Lab Charge-[facility] [99999] 3)  [facility]- Est  Level  4 [16109]    Laboratory Results   Blood Tests   Date/Time Received: November 16, 2010 8:37 AM  Date/Time Reported: November 16, 2010 9:13 AM   HGBA1C: 5.8%   (Normal Range: Non-Diabetic - 3-6%   Control Diabetic - 6-8%)  Comments: ...............test performed by......Marland KitchenBonnie A. [place], MLS (ASCP)cm

## 2010-12-03 NOTE — Assessment & Plan Note (Signed)
Summary: f/u eo(resch'd from [facility] for pain & bp/bmc   Vital Signs:  Patient profile:   55 year old female Height:      64.75 inches Weight:      293 pounds BMI:     49.31 Temp:     98.1 degrees F oral Pulse rate:   79 / minute BP sitting:   140 / 83  (left arm) Cuff size:   large  Vitals Entered By: Garen Grams LPN (October 30, 2010 8:38 AM) CC: still having back and hip pain Is Patient Diabetic? Yes Did you bring your meter with you today? No Pain Assessment Patient in pain? yes     Location: back/hip Intensity: 6   Primary [name]:  Hannah Grove DO  CC:  still having back and hip pain.  History of Present Illness: Low back pain radiating to posterior aspect of L hip x 4 weeks. Sudden onset when pt woke up one morning. Pt denies trauma.  Worse at night, 7/10-10/10. Slightly eased with oxycodone. No lose of bowel/bladder, ambulating, normal gait. Pt has stopped working out at [employer] b/c of pain. Cannot tolerate ASA/NSAIDs 2/2 to significant GI bleed 5 yrs ago.  MRI of hips reviewed-b/l hip osteoarthritis.   Preventive Screening-Counseling & Management  Alcohol-Tobacco     Smoking Status: never     Year Quit: ?  Allergies: 1)  ! Acetyl Salicylic Acid (Aspirin) 2)  ! Nsaids  Review of Systems       As per HPI  Physical Exam  General:  Vitals reviewed, Pt overweight-appearing.  Pt comfortable appearing.   Detailed Back/Spine Exam  Gait:    Normal heel-toe gait pattern bilaterally.    Skin:    Intact with no erythema; no scarring.    Lumbosacral Exam:  Range of Motion:    Hyperextension:   10 degrees Squatting:      Low back pain on hyperextension.  Lying Straight Leg Raise:    Right:  negative    Left:  positive at 40 degrees Sitting Straight Leg Raise:    Right:  negative    Left:  positive at 45 degrees Contralateral Straight Leg Raise:    Right:  negative    Left:  negative   Hip Exam  Hip Exam:    Right:  Inspection:  Normal    Palpation:  Normal    Stability:  stable    Tenderness:  no    Left:    Inspection:  Normal    Palpation:  Normal    Stability:  stable    Tenderness:  no    Normal rotation, adduction and abduction of hips.   Impression & Recommendations:  Problem # 1:  LOW BACK PAIN, ACUTE (ICD-724.2)  Low back with radicular symptoms. Etiology: stain from obesity.  Plan: Gabapentin, PT referral, encouraged pt to continue to exercise/stretch. F/u in 2-3 weeks. Except pain to resolve w/in 4-6 weeks of onset.   Orders: Physical Therapy Referral (PT) [facility]- Est Level  3 (16109)  Complete Medication List: 1)  Clonidine Hcl 0.2 Mg Tab (Clonidine hcl) .... Take 1 tablet by mouth twice a day 2)  Furosemide 20 Mg Tabs (Furosemide) .... Take 1 tab by mouth daily as need for swelling. 3)  Metformin Hcl 500 Mg Tabs (Metformin hcl) .... Take 1 tablet by mouth twice a day 4)  Tylenol Arthritis Pain 650 Mg Cr-tabs (Acetaminophen) .Marland Kitchen.. 1 by mouth two times a day to three times a day as needed  pain 5)  Colace 100 Mg Caps (Docusate sodium) .... Take 1 by mouth qd 6)  Ferrous Sulfate 325 (65 Fe) Mg Tabs (Ferrous sulfate) .... Take 1 by mouth tid 7)  Polyethylene Glycol 3350 Powd (Polyethylene glycol 3350) .... Dissolve 17 g in 4-8 oz of water of juice bid 8)  Nitroglycerin 0.4 Mg Subl (Nitroglycerin) .Marland Kitchen.. 1 tab sublingual q 5 minutes as needed chest pain.  if 3 doses does not relieve pain, call 911 9)  Spironolactone 50 Mg Tabs (Spironolactone) .Marland Kitchen.. 1 by mouth once daily for blood pressure 10)  Ambien 10 Mg Tabs (Zolpidem tartrate) .... 1/2 - 1 by mouth at bedtime as needed insomnia 11)  Centrum Silver Tabs (Multiple vitamins-minerals) 12)  B Complex Tabs (B complex vitamins) 13)  Oyst-cal D 250-125 Mg-unit Tabs (Calcium carbonate-vitamin d) 14)  Hydralazine Hcl 25 Mg Tabs (Hydralazine hcl) .Marland Kitchen.. 1 by mouth three times a day for blood pressure 15)  Carvedilol 25 Mg Tabs (Carvedilol) .Marland Kitchen.. 1  by mouth two times a day for blood pressure.  replaces cardiazem and metoprolol. 16)  Nexium 40 Mg Cpdr (Esomeprazole magnesium) .... Take 1 tab each morning 17)  Hydrocortisone 2.5 % Oint (Hydrocortisone) .... Apply to rash two times a day for 10 day then as needed, 60 gm 18)  Isosorbide Mononitrate Cr 60 Mg Xr24h-tab (Isosorbide mononitrate) .... Once daily per cards 19)  Diltiazem Hcl 30 Mg Tabs (Diltiazem hcl) .... Two times a day per cards 20)  Fexofenadine Hcl 180 Mg Tabs (Fexofenadine hcl) .... Sig take 1 tab by mouth one time daily disp 30 21)  Flonase 50 Mcg/act Susp (Fluticasone propionate) .... Sig: 2 sprays/nostril one time daily in the morning after nasal rinse disp 1 unit 22)  Oxycodone Hcl 5 Mg Tabs (Oxycodone hcl) .... Take 1 pill by mouth every 4hours as needed for severe pain 23)  Gabapentin 100 Mg Caps (Gabapentin) .... One tab three times a day for three days, then two tabs three times a day for 3 days, then three tab three times a day.  Patient Instructions: 1)  [name], 2)  It was a pleasure meeting you. 3)  For your back pain: I am adding Gabapentin.  4)  Take one tab 3x/day for three days, 2 tabs 3x/day for three days, then 3 tabs 3x/day for three days. 5)  Referral to PT. 6)  Keep moving, walking for 10-20 minutes per day 2-3x/day.  7)  Stretching back 2-3x/day. 8)  Keep up the weight-loss! This will ultimately take a lot of strain off your joints and muscles. Keep going to [employer] and water aerobics. Avoid activities that are painful. Try to get in a work-out 5x/day or more. 9)  F/u in 2 -3 weeks with me.  10)  -Dr. Armen Pickup  Prescriptions: GABAPENTIN 100 MG CAPS (GABAPENTIN) one tab three times a day for three days, then two tabs three times a day for 3 days, then three tab three times a day.  #90 x 1   Entered and Authorized by:   Dessa Phi MD   Signed by:   Dessa Phi MD on 10/30/2010   Method used:   Electronically to        [facility] [phone]* (retail)       [address]       [place], [place]  [address]       Ph: [phone]       Fax: [phone]   RxID:   [id number]  Orders Added: 1)  Physical Therapy Referral [PT] 2)  [facility]- Est Level  3 [[address]109]

## 2010-12-07 ENCOUNTER — Ambulatory Visit: Payer: Medicare PPO | Admitting: Physical Therapy

## 2010-12-09 ENCOUNTER — Ambulatory Visit: Payer: Medicare PPO | Attending: Family Medicine | Admitting: Physical Therapy

## 2010-12-09 DIAGNOSIS — IMO0001 Reserved for inherently not codable concepts without codable children: Secondary | ICD-10-CM | POA: Insufficient documentation

## 2010-12-09 DIAGNOSIS — M25559 Pain in unspecified hip: Secondary | ICD-10-CM | POA: Insufficient documentation

## 2010-12-09 DIAGNOSIS — M25659 Stiffness of unspecified hip, not elsewhere classified: Secondary | ICD-10-CM | POA: Insufficient documentation

## 2010-12-09 DIAGNOSIS — M545 Low back pain, unspecified: Secondary | ICD-10-CM | POA: Insufficient documentation

## 2010-12-09 NOTE — Assessment & Plan Note (Signed)
Summary: rt hand swollen/eo   Vital Signs:  Patient profile:   55 year old female Height:      64.75 inches Weight:      295 pounds BMI:     49.65 Temp:     98.4 degrees F oral Pulse rate:   78 / minute BP sitting:   147 / 95  (left arm) Cuff size:   large  Vitals Entered By: Tessie Fass CMA (November 30, 2010 9:26 AM) CC: pain and swelling in right hand Is Patient Diabetic? Yes Pain Assessment Patient in pain? yes     Location: right hand Intensity: 8   Primary Care Provider:  Dessa Phi MD  CC:  pain and swelling in right hand.  History of Present Illness:   Sat work up with pain and swellng in right thumb this progressed to entire hand Not able to use the hand to open things,  unable to bathe or comb hair, unable to grasp  Right handed  No particular injury to hand No other swollen joints wrapped hand last night with ACE wrap Pt took her vicodin she had previoulsy - this did not help the pain  Allergies: 1)  ! Acetyl Salicylic Acid (Aspirin) 2)  ! Nsaids  Past History:  Past Medical History: Last updated: 08/26/2010 *******ASA/ NSAIDS CONTRAINDICATED ( recurrent GI bleed, see below)    -- PANDIVERTICULOSIS ( Colonoscopy Dr. Arlyce Dice in 2007)    --Gastrointestinal hemorrhage:      1- hx of  GI  bleed when on NSAIDS (rectal bleeding x2  while on  tx  for knee pain in 2002)    2 - GI BLEED RECURRENT x 4 : 2/2  to colon diverticula (11/03/05 x2). F/u with Surgery: Dr. Purnell Shoemaker. 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  - Creatinine 0.7 10/06 ( 2+ microalbumin 10/06) - POSTMENOPAUSAL STATUS (ICD-V49.81) -  VENOUS INSUFFICIENCY, CHRONIC (ICD-459.81)  Physical Exam  General:  Well-developed,well-nourished, obese body habitus;Vital signs noted  Msk:  Right wrist- pain with extension and flexion, mild pain with flexion at MIP right hand Swelling noted over wrist and right thumb Pain with extenion of thumb +finkelsteins test no erythema Strength decreased - Right hand based on pain 4/5 compared to left, grasp weaker right hand Rotator cuff- Right in tact Left wrist- normal ROM, no swelling noted Pulses:  radial pulses 2+ Neurologic:  Sensation grossly in tact in right hand Additional Exam:  Injection- Right Dequervian tendon sheath Pt consented,all questions answered Antiseptic- ETOH wipes Injection- Kenolog 40mg  0.28ml with 1% lidocian 0.40ml No bleeding noted Pt tolerated procedure well Pulses palpable s/p procedure Able to extend and flex at wrist and thumb s/p procedure  Dr. Jeanice Lim, Dr. Corbin Ade (Attending/ Denny Levy MD)   Impression & Recommendations:  Problem # 1:  DE QUERVAIN'S TENOSYNOVITIS (ICD-727.04) Assessment New  Unclear  cause as no specific injury, injection for acute treatment of tenosynovitis, see pt instructions, if no improvment consider repeat injection and placement in thumb splint.  Imaging not needed at this time  Orders: [facility]- Est Level  3 (72536) Injection, intermediate joint - [facility] (64403)  Complete Medication List: 1)  Clonidine Hcl 0.2 Mg Tab (Clonidine hcl) .... Take 1 tablet by mouth twice a day 2)  Furosemide 20 Mg Tabs (Furosemide) .... Take 1 tab by mouth daily as need for swelling. 3)  Metformin Hcl 500 Mg Tabs (Metformin hcl) .... Take 1 tablet by mouth daily. 4)  Tylenol Arthritis Pain 650 Mg Cr-tabs (Acetaminophen) .Marland Kitchen.. 1 by mouth two times a day to three times a day as needed pain 5)  Colace 100 Mg Caps (Docusate sodium) .... Take 1 by mouth qd 6)  Ferrous Sulfate 325 (65 Fe) Mg Tabs (Ferrous sulfate) .... Take 1 by mouth tid 7)   Polyethylene Glycol 3350 Powd (Polyethylene glycol 3350) .... Dissolve 17 g in 4-8 oz of water of juice bid 8)  Nitroglycerin 0.4 Mg Subl (Nitroglycerin) .Marland Kitchen.. 1 tab sublingual q 5 minutes as needed chest pain.  if 3 doses does not relieve pain, call 911 9)  Spironolactone 50 Mg Tabs (Spironolactone) .Marland Kitchen.. 1 by mouth once daily for blood pressure 10)  Ambien 10 Mg Tabs (Zolpidem tartrate) .... 1/2 - 1 by mouth at bedtime as needed insomnia 11)  Centrum Silver Tabs (Multiple vitamins-minerals) 12)  B Complex Tabs (B complex vitamins) 13)  Oyst-cal D 250-125 Mg-unit Tabs (Calcium carbonate-vitamin d) 14)  Hydralazine Hcl 25 Mg Tabs (Hydralazine hcl) .Marland Kitchen.. 1 by mouth three times a day for blood pressure 15)  Carvedilol 25 Mg Tabs (Carvedilol) .Marland Kitchen.. 1 by mouth two times a day for blood pressure.  replaces cardiazem and metoprolol. 16)  Nexium 40 Mg Cpdr (Esomeprazole magnesium) .... Take 1 tab each morning 17)  Hydrocortisone 2.5 % Oint (Hydrocortisone) .... Apply to rash two times a day for 10 day then as needed, 60 gm 18)  Isosorbide Mononitrate Cr 60 Mg Xr24h-tab (Isosorbide mononitrate) .... Once daily per cards 19)  Diltiazem Hcl 30 Mg Tabs (Diltiazem hcl) .... Two times a day per cards 20)  Fexofenadine Hcl 180 Mg Tabs (Fexofenadine hcl) .... Sig take 1 tab by mouth one time daily disp 30 21)  Flonase 50 Mcg/act Susp (Fluticasone propionate) .... Sig: 2 sprays/nostril one time daily in the morning after nasal rinse disp 1 unit 22)  Oxycodone Hcl 5 Mg Tabs (Oxycodone hcl) .... Take 1 pill by mouth every 4hours as needed for severe pain 23)  Gabapentin 100 Mg Caps (Gabapentin) .... One tab three times a day for three days, then two tabs three times a day for 3 days, then three tab three times a day. as needed for back pain. currently not taking.  Patient Instructions: 1)  For your hand this is inflammation of the tendon sheath in your wrist 2)  The injection should take  a few days to give your full  potential 3)  You can use ice on the area as needed  4)  Return in 2 weeks if not improved    Orders Added: 1)  [facility]- Est Level  3 [99213] 2)  Injection, intermediate joint - [facility] [20605]

## 2010-12-09 NOTE — Miscellaneous (Signed)
Summary: Procedures consent  Procedures consent   Imported By: De Nurse 12/04/2010 12:09:16  _____________________________________________________________________  External Attachment:    Type:   Image     Comment:   External Document

## 2010-12-15 ENCOUNTER — Ambulatory Visit: Payer: Medicare PPO | Admitting: Physical Therapy

## 2010-12-16 ENCOUNTER — Ambulatory Visit (INDEPENDENT_AMBULATORY_CARE_PROVIDER_SITE_OTHER): Payer: Medicare PPO | Admitting: Family Medicine

## 2010-12-16 ENCOUNTER — Encounter: Payer: Self-pay | Admitting: Family Medicine

## 2010-12-16 VITALS — BP 118/80 | HR 98 | Temp 98.5°F | Ht 64.0 in | Wt 291.1 lb

## 2010-12-16 DIAGNOSIS — M654 Radial styloid tenosynovitis [de Quervain]: Secondary | ICD-10-CM

## 2010-12-16 DIAGNOSIS — M545 Low back pain, unspecified: Secondary | ICD-10-CM

## 2010-12-16 DIAGNOSIS — I10 Essential (primary) hypertension: Secondary | ICD-10-CM

## 2010-12-16 NOTE — Assessment & Plan Note (Signed)
Well controlled.  No extremes of low or high since last visit.  Will continue current regimen.

## 2010-12-16 NOTE — Patient Instructions (Signed)
[name],  Thank you for coming in today.  BP-looks great. No changes to your medications.  Physical therapy-seems to be helping. Keep up the good  work at therapy and at home.  Wrist pain- to prevent the pain from worsening. Here are a few things you should do. 1. Ice the area for 15 minutes every four to six hours and take acetaminophen (1000 mg twice daily) for pain. 2. Protect the wrist for three to four weeks with a splint (this is optional)        3.  Avoid grasping and lifting with the wrist bent to the side.

## 2010-12-17 ENCOUNTER — Ambulatory Visit: Payer: Medicare PPO | Admitting: Physical Therapy

## 2010-12-21 ENCOUNTER — Encounter: Payer: Self-pay | Admitting: Physical Therapy

## 2010-12-23 ENCOUNTER — Ambulatory Visit: Payer: Medicare PPO | Admitting: Physical Therapy

## 2010-12-29 ENCOUNTER — Ambulatory Visit: Payer: Medicare PPO | Admitting: Physical Therapy

## 2010-12-31 ENCOUNTER — Ambulatory Visit: Payer: Medicare PPO | Attending: Family Medicine | Admitting: Physical Therapy

## 2010-12-31 DIAGNOSIS — M25659 Stiffness of unspecified hip, not elsewhere classified: Secondary | ICD-10-CM | POA: Insufficient documentation

## 2010-12-31 DIAGNOSIS — IMO0001 Reserved for inherently not codable concepts without codable children: Secondary | ICD-10-CM | POA: Insufficient documentation

## 2010-12-31 DIAGNOSIS — M545 Low back pain, unspecified: Secondary | ICD-10-CM | POA: Insufficient documentation

## 2010-12-31 DIAGNOSIS — M25559 Pain in unspecified hip: Secondary | ICD-10-CM | POA: Insufficient documentation

## 2011-01-04 ENCOUNTER — Ambulatory Visit: Payer: Medicare PPO | Admitting: Physical Therapy

## 2011-01-06 ENCOUNTER — Encounter: Payer: Self-pay | Admitting: Physical Therapy

## 2011-01-12 ENCOUNTER — Ambulatory Visit (INDEPENDENT_AMBULATORY_CARE_PROVIDER_SITE_OTHER): Payer: Medicare PPO | Admitting: Family Medicine

## 2011-01-12 VITALS — BP 118/70 | HR 60 | Temp 98.1°F | Wt 294.0 lb

## 2011-01-12 DIAGNOSIS — R21 Rash and other nonspecific skin eruption: Secondary | ICD-10-CM | POA: Insufficient documentation

## 2011-01-12 MED ORDER — POLYETHYLENE GLYCOL 3350 17 G PO PACK: 17.0000 g | PACK | Freq: Every day | ORAL | Status: DC

## 2011-01-12 MED ORDER — SPIRONOLACTONE 50 MG PO TABS: 50.0000 mg | ORAL_TABLET | Freq: Every day | ORAL | Status: DC

## 2011-01-12 MED ORDER — DILTIAZEM HCL 30 MG PO TABS: 30.0000 mg | ORAL_TABLET | Freq: Two times a day (BID) | ORAL | Status: DC

## 2011-01-12 MED ORDER — TRIAMCINOLONE ACETONIDE 0.5 % EX OINT: TOPICAL_OINTMENT | Freq: Two times a day (BID) | CUTANEOUS | Status: DC

## 2011-01-12 MED ORDER — ZOLPIDEM TARTRATE 10 MG PO TABS: 10.0000 mg | ORAL_TABLET | Freq: Every evening | ORAL | Status: DC | PRN

## 2011-01-12 MED ORDER — CARVEDILOL 25 MG PO TABS: 25.0000 mg | ORAL_TABLET | Freq: Two times a day (BID) | ORAL | Status: DC

## 2011-01-12 MED ORDER — CLONIDINE HCL 0.2 MG PO TABS: 0.2000 mg | ORAL_TABLET | Freq: Two times a day (BID) | ORAL | Status: DC

## 2011-01-12 MED ORDER — HYDRALAZINE HCL 25 MG PO TABS: 25.0000 mg | ORAL_TABLET | Freq: Three times a day (TID) | ORAL | Status: DC

## 2011-01-12 MED ORDER — FUROSEMIDE 20 MG PO TABS: 20.0000 mg | ORAL_TABLET | Freq: Every day | ORAL | Status: DC | PRN

## 2011-01-12 NOTE — Patient Instructions (Signed)
Thank you for coming in today.  I have refilled your miralax, here is a script for your ambien. For you rash I have sent triamcinolone 0.05% ointment. Please use twice daily for next 10-14 days.  Come back in 2-3 weeks if the rash does not get better.

## 2011-01-12 NOTE — Assessment & Plan Note (Signed)
Improved with physical therapy. Agree with plan to continue therapy and weight loss.

## 2011-01-12 NOTE — Assessment & Plan Note (Signed)
Improved following injection.  Some residual pain currently non-tender, full ROM.  Repeat injection if worsens.

## 2011-01-12 NOTE — Progress Notes (Signed)
  Subjective:    Patient ID: [name], female    DOB: May 22, 1956, 55 y.o.   MRN: [id number]  HPI  Here for HTN  f/u  and to f/u PT for back pain.  HTN- taking meds as prescribed. Few low BPs 90/67 x 2 days. Generally normal BP 128/90s. Pt denies syncope, CP, SOB.   Back pain- seems to be improved with PT. Still has pain occasionally. L sided Sharp in nature. Therapy is improving. She is learning strengthening exercises. And pain coping techniques, heat therapy.   Review of Systems As per HPI    Objective:   Physical Exam  Constitutional: She appears well-developed and well-nourished.  HENT:  Head: Normocephalic and atraumatic.  Neck: Normal range of motion. Neck supple.  Cardiovascular: Normal rate and regular rhythm.   Pulmonary/Chest: Effort normal and breath sounds normal.  Musculoskeletal:       Right wrist: She exhibits normal range of motion, no tenderness, no swelling and no effusion.       Back in nontender. No pain with flexion and extension. Full ROM.           Assessment & Plan:  55 yo F here for f/u HTN and back pain.   See problem list for Assessment and Plan.

## 2011-01-12 NOTE — Progress Notes (Signed)
  Subjective:    Patient ID: [name], female    DOB: October 15, 1956, 55 y.o.   MRN: [id number]  HPI Pt here for med refill. Also with a specific complaint: 1. Rash under L arm. Present for one week. Pruritic and burning. Pt tried using neosporin for re;eif. The neosporin made the itching worse. She does shave under her arms. Denies insect bite. Pt is not sure if she has changed any make up, lotion, clothes, etc.    Review of Systems  Constitutional: Negative.   Skin: Positive for rash. Negative for color change, pallor and wound.       Objective:   Physical Exam Skin: erythematous , rounded  plaques of ~ 1 to 3 cm of diameter  with clear center in L posterior shoulder.        Assessment & Plan:

## 2011-01-13 LAB — GLUCOSE, CAPILLARY: Glucose-Capillary: 86 mg/dL (ref 70–99)

## 2011-01-15 ENCOUNTER — Emergency Department (HOSPITAL_COMMUNITY)
Admission: EM | Admit: 2011-01-15 | Discharge: 2011-01-15 | Disposition: A | Discharge status: Home / Self Care | Payer: Medicare PPO | Attending: Emergency Medicine | Admitting: Emergency Medicine

## 2011-01-15 DIAGNOSIS — S9030XA Contusion of unspecified foot, initial encounter: Secondary | ICD-10-CM | POA: Insufficient documentation

## 2011-01-15 DIAGNOSIS — E119 Type 2 diabetes mellitus without complications: Secondary | ICD-10-CM | POA: Insufficient documentation

## 2011-01-15 DIAGNOSIS — I10 Essential (primary) hypertension: Secondary | ICD-10-CM | POA: Insufficient documentation

## 2011-01-15 DIAGNOSIS — W2209XA Striking against other stationary object, initial encounter: Secondary | ICD-10-CM | POA: Insufficient documentation

## 2011-01-15 DIAGNOSIS — M79609 Pain in unspecified limb: Secondary | ICD-10-CM | POA: Insufficient documentation

## 2011-01-28 ENCOUNTER — Ambulatory Visit (INDEPENDENT_AMBULATORY_CARE_PROVIDER_SITE_OTHER): Payer: Medicare PPO | Admitting: Family Medicine

## 2011-01-28 ENCOUNTER — Encounter: Payer: Self-pay | Admitting: Family Medicine

## 2011-01-28 VITALS — BP 119/83 | HR 82 | Temp 98.0°F | Ht 65.0 in | Wt 292.2 lb

## 2011-01-28 DIAGNOSIS — M722 Plantar fascial fibromatosis: Secondary | ICD-10-CM | POA: Insufficient documentation

## 2011-01-28 NOTE — Patient Instructions (Signed)
Make appointment up front for [facility] for orthotics.  Let me know if staff has questions about this. Bring the two pairs of shoes you wear most to the appt.

## 2011-01-28 NOTE — Progress Notes (Signed)
  Subjective:    Patient ID: [name], female    DOB: 02-16-1956, 55 y.o.   MRN: [id number]  HPI  2 weeks pain right foot.  Went to ER, states got some pain meds which she does not know the name of, and was given crutches,  States no xray was done.no inciting injury or recent overuse.  Has never had this before.  Worse with walking.    Review of Systems No fever, chills, rash, weakness, numbness    Objective:   Physical Exam  Musculoskeletal:       Tender over insertion of plantar fascia on medial right heel.  Normal strength, sensation, rom or ankle.  No pain over achilles tendon.   No edema or erythema          Assessment & Plan:

## 2011-01-28 NOTE — Assessment & Plan Note (Addendum)
Right foot.  Morbid obesity risk factor.  Cannot tolerate NSAIDS.  Given patient education and exercises.  Asked patient to schedule with [facility] medicine Clinic for orthotics and treatment as indicated.

## 2011-02-07 NOTE — Assessment & Plan Note (Addendum)
Likely atopic dermatitis.  Will try Triamcinolone 0.1% cream as this treatment has worked in the past for a similar eruption.  Plan to have patient f/u on in 2-3 weeks if rash does not resolve.

## 2011-02-08 ENCOUNTER — Ambulatory Visit (INDEPENDENT_AMBULATORY_CARE_PROVIDER_SITE_OTHER): Payer: Medicare PPO | Admitting: Family Medicine

## 2011-02-08 ENCOUNTER — Encounter: Payer: Self-pay | Admitting: Family Medicine

## 2011-02-08 DIAGNOSIS — M722 Plantar fascial fibromatosis: Secondary | ICD-10-CM

## 2011-02-08 NOTE — Assessment & Plan Note (Signed)
CSI injection

## 2011-02-08 NOTE — Progress Notes (Signed)
  Subjective:    Patient ID: [name], female    DOB: [birth year], 55 y.o.   MRN: [id number]  HPI Right foot pain since the beginning of March. She stepped on some rocky areas at this point she was picking up her granddaughter. Has continued to have daily pain worse in the morning. No prior foot injury.  Review of Systems No numbness in her foot. As noted no redness of the foot.    Objective:   Physical Exam    general: Obese no acute distress Right foot tender to palpation at the origin of the plantar fascia. Bilaterally she has severe pes planus with first right bunion formation. She has intact sensation to soft touch. She has normal arterial pulses 2+ bilaterally and dorsalis pedis.  Ultrasound reveals both plantar fascia have areas of degeneration. There is some edema on the plantar fascia on the left foot. INJECTION: Patient was given informed consent, signed copy in the chart. Appropriate time out was taken. Area prepped and draped in usual sterile fashion. 1 cc of kenalog plus  1 cc of lidocaine was injected into the right foot in area above the plantar fascia using a(n) medial approach. The patient tolerated the procedure well. There were no complications. Post procedure instructions were given.    Assessment & Plan:  Plantar fasciitis.  Plan: After discussion we decided to do the injection. Given her long-standing pes planus without issues I don't think at this point I will make any orthotics. She's not had a significant improvement in 2 weeks after the injection she will return to clinic and we will reconsider the orthotics.

## 2011-02-23 ENCOUNTER — Ambulatory Visit (INDEPENDENT_AMBULATORY_CARE_PROVIDER_SITE_OTHER): Payer: Medicare PPO | Admitting: Family Medicine

## 2011-02-23 ENCOUNTER — Encounter: Payer: Self-pay | Admitting: Family Medicine

## 2011-02-23 VITALS — BP 138/91 | HR 83 | Temp 97.9°F | Wt 294.0 lb

## 2011-02-23 DIAGNOSIS — R252 Cramp and spasm: Secondary | ICD-10-CM | POA: Insufficient documentation

## 2011-02-23 DIAGNOSIS — L819 Disorder of pigmentation, unspecified: Secondary | ICD-10-CM

## 2011-02-23 HISTORY — DX: Disorder of pigmentation, unspecified: L81.9

## 2011-02-23 LAB — CK: Total CK: 116 U/L (ref 7–177)

## 2011-02-23 LAB — COMPREHENSIVE METABOLIC PANEL
ALT: 14 U/L (ref 0–35)
AST: 13 U/L (ref 0–37)
Albumin: 4.1 g/dL (ref 3.5–5.2)
Alkaline Phosphatase: 83 U/L (ref 39–117)
BUN: 13 mg/dL (ref 6–23)
CO2: 23 mEq/L (ref 19–32)
Calcium: 9.4 mg/dL (ref 8.4–10.5)
Chloride: 107 mEq/L (ref 96–112)
Creat: 0.76 mg/dL (ref 0.40–1.20)
Glucose, Bld: 101 mg/dL — ABNORMAL HIGH (ref 70–99)
Potassium: 4.2 mEq/L (ref 3.5–5.3)
Sodium: 141 mEq/L (ref 135–145)
Total Bilirubin: 0.3 mg/dL (ref 0.3–1.2)
Total Protein: 7 g/dL (ref 6.0–8.3)

## 2011-02-23 MED ORDER — FLUTICASONE PROPIONATE 0.05 % EX CREA: TOPICAL_CREAM | Freq: Two times a day (BID) | CUTANEOUS | Status: AC

## 2011-02-23 NOTE — Patient Instructions (Signed)
[name],  Thank you for coming in today,  For the light spot on your wrist we will try a steroid cream and sunscreen for two weeks. If it does not get better we will consider another course of treatment.   For you leg cramps: we will check some blood work today. Restart the Neurontin and do the following for prevention/treatment.  To prevent cramps try the following:  In generally sedentary patients, riding a stationary bicycle for a few minutes before retiring  Stretching exercises. These exercises can be performed in the weight bearing position. They are held for 10 to 20 seconds and repeated three to five times in succession, four times daily for one week, then twice daily in the evening and again before going to bed. Keeping the bed covers at the foot of the bed loose and not tucked in  Maintaining adequate hydration, particularly in older adults and patients on diuretics. Avoidance of alcohol and caffeine may also be helpful. Exercising in extreme heat should be avoided. If a cramp occurs, try the following:  Walking or leg jiggling followed by leg elevation  A hot shower with the stream directed at the cramp area of the body, usually for five minutes, or a warm tub bath  Ice massage   For your low blood pressures at night. No medications changes now. Be sure to discuss this with your Cardiologist when you see him.

## 2011-02-26 ENCOUNTER — Telehealth: Payer: Self-pay | Admitting: Family Medicine

## 2011-02-26 NOTE — Telephone Encounter (Signed)
Needs to talk to doctor about meds for her cramps - doesn't want steriod pls call asap

## 2011-03-16 NOTE — Discharge Summary (Signed)
[name], COTHRAN NO.:  [id number]   MEDICAL RECORD NO.:  [id number]          PATIENT TYPE:  INP   LOCATION:  3713                         FACILITY:  [facility]   PHYSICIAN:  Pearlean Brownie, M.D.DATE OF BIRTH:  November 25, 1955   DATE OF ADMISSION:  01/15/2008  DATE OF DISCHARGE:  01/23/2008                               DISCHARGE SUMMARY   REASON FOR ADMISSION:  GI bleed.   DISCHARGE DIAGNOSES:  1. Gastrointestinal bleed.  2. Anemia.  3. Pandiverticulosis.  4. Hypertension.  5. History of cerebrovascular accident secondary to hypotensive      episodes.  6. Prediabetes.  7. Obesity.   DISCHARGE MEDICATIONS:  1. Accupril 40 mg p.o. daily.  2. Lopressor 50 mg p.o. b.i.d.  3. Ferrous sulfate 325 mg p.o. t.i.d.  4. Colace 100 mg p.o. daily.  5. Metformin 500 mg p.o. b.i.d.  6. HCTZ 12.5 mg p.o. daily.   CONSULTS:  Gastroenterology in [facility].   PROCEDURES AND STUDIES:  On January 17, 2008, the patient had a mesenteric  arteriogram and ultrasound guidance for a vascular access to look for  source of GI bleeding.  The patient had a tagged red blood cell scan on  January 17, 2008, which revealed active gastrointestinal bleeding at the  level of the splenic flexure within the colon.  The patient had a second  tagged red blood cell scan on January 19, 2008, which revealed no evidence  of active GI bleeding.   LABORATORY DATA:  On admission, the patient's CBC revealed Membreno blood  cell count of 11.5, hemoglobin 10.2, hematocrit 30.7, and platelet count  288.  Hemoccult was positive.  Subsequently, she was monitored on her  CBCs.  On January 17, 2008 rapidly dropped to a hemoglobin of 8.0.  Hemoglobin, thereafter receiving blood, bounced around between 7.5 and  9.4.  Basic metabolic panel at time of discharge revealed sodium 139,  potassium 3.5, chloride 108, bicarb 25, glucose 131, BUN 6, creatinine  0.62, and calcium 8.5.   HOSPITAL COURSE:  Ms.  [name] is a very pleasant 55 year old female who  presented initially to the ED after a day of  bright red blood per  rectum.  At the time of admission, she continued to have bright red  blood per rectum and thus GI and [facility] were called.  In previous notes it was documented that [facility]  felt that given her pandiverticulosis that the patient if subsequently  bled, will need at least a partial colectomy.  The patient subsequently  stopped bleeding and thus was monitored in health to ensure this had  truly stopped.  On January 17, 2008, she acutely began bleeding again and  actually had a syncopal episode upon which a blue  was called.  The  patient actually never did require CPR with the syncopal episode and  quickly recovered.  She was transferred to the ICU where she was sent  for a stat tagged red blood cell scan to identify source of bleeding.  As noted above, bleeding source was thought to be felt  in the splenic  flexure; therefore, the patient was taken by interventional radiology to  try to embolize the source of bleeding.    Procedure as above was performed, but they were unable to localize place  of bleeding.  Subsequently, the patient actually stopped bleeding until  January 19, 2008, when again her hemoglobin dropped to 7.5.  Again, she  had a stat tagged red blood cell scan once again showed bleeding in the  splenic flexure.  Interventional radiology was unable to find the source  of bleeding again as it has stopped again as before.  GI then came in  and did a colonoscopy after clean out.  This colonoscopy showed  pandiverticulosis as suspected, but could not identify a specific source  of bleeding.  The patient during her hospital stay received 7 units  total of packed red blood cells.  At the time of discharge, her  hemoglobin was stable at 9.7.  GI felt like they had nothing more to  offer at this point in terms of treatment and  [facility]  felt since she had no bleeding episodes for 3 days that she was stable  for discharge with followup in 2 weeks to begin workup for elective  partial colectomy for pandiverticulosis.  At time of discharge, the  patient was still somewhat orthostatic, but new precautions to stand up  slowly and to continue to consume plenty of fluids.  She did receive 1  unit of packed red blood cells just prior to discharge in order to  bump  her hemoglobin a little more.  She was given specific instructions to  return to the ED for any rectal bleeding or blood in her stool.   1. Anemia.  This anemia is multifactorial as she is iron deficient.      Our plan was to supplement her with iron and watch for GI bleed      with her hemoglobin.  Again, she did receive a total of 7 units of      packed red blood cells during her hospitalization.  She was stable      with hemoglobin right at approximately 10 at time of discharge.  2. Hypertension.  When patient initially came in, she was actually      hypotensive thus many of her blood pressure medicines from home      were held.  We have monitored her blood pressure closely through      her stay and as her blood pressures slowly crept up as her      hemoglobin and fluid status improved, we added these back on      slowly.  At discharge, we continued her on Lopressor 50 mg twice      daily as this is easier to stop than her long-acting beta blocker      and restarted her ACE inhibitor and HCTZ at half of her dose.  We      will continue to hold her clonidine and Tiazac, as her hypertension      seemed to be reasonably controlled without these medicines during      her stay.  Certainly, she will need close followup with her blood      pressure and her medicines titrated as her fluid status and      hemoglobin improved after her acute bleed.  3. History of CVA after hypertensive episode, as above.  She was      monitored closely for her  hypertension.  Her medicines were added      back stepwise during her hospital stay, and she was felt stable on      the medication regimen than she was sent home on at time of      discharge.  4. Prediabetes.  She had good CBG control while here.  Her metformin      was held during her time here given that she would need so many      studies and we wanted to avoid kidney injury.  At time of      discharge, it had been several days since her last study, and      therefore, it was felt safe to restart her on metformin.  She was      continued on sliding scale insulin during her hospital stay and      tolerated this well with CBGs all less than 200 throughout her      hospital stay.   INSTRUCTIONS:  The patient is to follow up in hospital followup clinic,  a family practice center on January 25, 2008 at 8.30 a.m.  She is also to  follow up with Dr. Freida Busman at [facility], phone number is  [phone] on February 05, 2008 at 10.15 a.m.  She is to follow a low-residue  low-sodium,  heart-healthy and low-carbohydrate modified diet.  She is  to return to the ED for any rectal bleeding or blood in her stool and  she is to call her  cardiologist with which she was scheduled initially to have an  appointment during her hospital stay as soon as she got home to  reschedule her appointment.  She was instructed to increase her  activities slowly while standing to avoid orthostatic hypotension.      Ancil Boozer, MD  Electronically Signed      Pearlean Brownie, M.D.  Electronically Signed    SA/MEDQ  D:  01/24/2008  T:  01/25/2008  Job:  [id number]   cc:   Ruthe Mannan, M.D.  Lennie Muckle, MD  [facility]

## 2011-03-16 NOTE — Discharge Summary (Signed)
[name], [name] NO.:  [id number]   MEDICAL RECORD NO.:  [id number]          PATIENT TYPE:  OBV   LOCATION:  6708                         FACILITY:  [facility]   PHYSICIAN:  Leighton Roach McDiarmid, M.D.DATE OF BIRTH:  1955-11-27   DATE OF ADMISSION:  09/20/2008  DATE OF DISCHARGE:  09/21/2008                               DISCHARGE SUMMARY   DISCHARGE DIAGNOSES:  1. Anemia.  2. Pandiverticulosis.  3. Diabetes.  4. Hypertension.  5. History of cerebrovascular accident.   DISCHARGE MEDICATIONS:  1. Accupril 40 mg 1 tablet p.o. daily.  2. Metformin 500 mg 1 tablet twice daily.  3. Metoprolol 200 mg 1 tablet daily.  4. Clonidine 0.2 mg 1 tablet twice daily.  5. Hydrochlorothiazide 25 mg 1 tablet daily.  6. Tiazac 360 mg 1 tablet daily.  7. Colace 100 mg 1 tablet twice daily.  8. Ferrous sulfate 325 mg 1 tablet 3 times daily.  9. Lasix 200 mg 1 tablet as needed for swelling.  10.Cozaar 50 mg 1 tablet daily.   DISCONTINUED MEDICATIONS:  None.   CONSULTANT:  None.   PROCEDURES:  Two units of packed red blood cell transfusion on September 20, 2008.   LABORATORY DATA:  Status post 2 units of packed red blood transfusion,  the patient's CBC showed Sautter blood cell 8.2, hemoglobin 9.8,  hematocrit 30.1, and platelets 301.  On admission, the patient's CBC  showed Friedl blood cell 7.6, hemoglobin 7.7, hematocrit 23.5, and  platelets of 292.   BRIEF HOSPITAL COURSE:  This is a 55 year old female with a history of  pandiverticulosis, who is admitted to [facility] for blood transfusions.   1. Anemia.  The patient has a diagnosis of pandiverticulosis and she      is awaiting a partial colectomy, but per her surgeon, she has to      lose 80 pounds before they can perform surgery.  The patient is      awaiting Lap-Band to have her lose the weight.  Meanwhile, she      sometimes develop a GI bleed leading to anemia.  Of note, she was      recently admitted to [facility] for GI  bleed on September 15, 2008,      and was discharged on September 19, 2008, when her hemoglobin was      found to be stable at 9.3.  The patient was advised to follow up at      the [facility] for hemoglobin draw.  On September 20, 2008, she arrived to have her blood drawn and her hemoglobin was      decreased to 7.7 at that time, the decision was made to admit her      to [facility] to give her a 2-unit of packed red blood cell      transfusion.  The patient did well overnight, had no complaints.      Denies shortness of breath, lightheadedness, headaches, dizziness,      and her vitals have been stable to this hospitalization.  On  the      morning of status post her transfusion, her CBC reacted      appropriately with a hemoglobin of 9.8.  This seems to be the      patient's baseline, and it was determined that the patient can be      discharged to go home with close followup at the [facility] on Monday, September 23, 2008, for another lab draw to ensure      that her hemoglobin does not drop.  2. Diabetes.  The patient's CBG has been stable during this      hospitalization with a CBG of 110 at 1649 and 101 at 2243.  She was      placed during this hospitalization on metformin 500 b.i.d.  She      will be discharged on her home medications.  3. Hypertension.  During this hospitalization, the patient was given      metoprolol 200 mg b.i.d. to maintain her blood pressure and they      had been stable with a systolic pressure between 102-137, diastolic      between 62-93.  She will be discharged home with her home      medications.  4. History of cerebrovascular accident.  Due to her pandiverticulosis      with a history of abdominal bleed and rectal bleeding, we have      advised the patient not to take any blood dinner at this time.   DISCHARGE INSTRUCTIONS:  Activity; no restrictions.  Diet; low-sodium  heart-healthy carb modified diet.  Wound care; not  applicable.   FOLLOWUP APPOINTMENTS:  The patient has an appointment on September 23, 2008, at the [facility] to have a blood draw to check her  hemoglobin.  She has a followup appointment with Dr. Ruthe Mannan, her  primary care doctor at the [facility] on October 02, 2008.   DISCHARGE CONDITION:  Stable.   LOCATION:  Discharged to home.      Angeline Slim, MD  Electronically Signed      Leighton Roach McDiarmid, M.D.  Electronically Signed    CT/MEDQ  D:  09/21/2008  T:  09/21/2008  Job:  [id number]   cc:   Ruthe Mannan, M.D.

## 2011-03-16 NOTE — Discharge Summary (Signed)
[name], [name] NO.:  [id number]   MEDICAL RECORD NO.:  [id number]          PATIENT TYPE:  INP   LOCATION:  6712                         FACILITY:  [facility]   PHYSICIAN:  Leighton Roach McDiarmid, M.D.DATE OF BIRTH:  1956/02/16   DATE OF ADMISSION:  09/15/2008  DATE OF DISCHARGE:  09/18/2008                               DISCHARGE SUMMARY   PRIMARY CARE Conna Terada:  Ruthe Mannan, MD at [facility].   DISCHARGE DIAGNOSES:  1. Pandiverticulosis with gastrointestinal bleed.  2. Hypertension.  3. Diabetes mellitus.   DISCHARGE MEDICATIONS:  1. Metformin 500 mg one tablet twice a day.  2. Metoprolol 200 mg one tablet once a day.  3. Colace 100 mg one tablet daily.  4. Ferrous sulfate 325 mg t.i.d.   DISCONTINUED MEDICATIONS:  1. Accupril 40 mg daily.  2. Clonidine 0.2 mg twice daily.  3. Hydrochlorothiazide 25 mg daily.  4. Tiazac 360 mg daily.  5. Lasix 20 mg p.r.n.  6. Cozaar 50 mg daily.  These medications were held due to the patient being hypotensive.   CONSULT:  [facility], Dr. Freida Busman.  Gastroenterology, Dr. Russella Dar.   LABORATORY DATA:  1. Fecal occult blood positive.  2. CBC on admission showed hemoglobin 11.3, hematocrit 34.7, Ruane      blood count 7, platelet 282.  The patient had serial hemoglobin and      hematocrit, which were stable 24 hours prior to discharge.  Last      CBC prior to discharge showed hemoglobin stable at 9.8.   BRIEF HOSPITAL COURSE:  This is a 55 year old patient previously known  to have pandiverticulitis who had a bowel movement that was grossly  bloody and was admitted with shortness of breath, dizziness, abdominal  pain, and lightheadedness.  1. Pandiverticulosis.  The patient was admitted for monitoring of GI      bleeding, serial hemoglobin stabilized at 9.8 and the patient did      not require transfusion during this hospital stay.  [facility] was consulted and due to  surgical risks factors,      it was advised to wait until the patient had lost weight before      pursuing colectomy.  The patient scheduled for Lap-Band      consultation in December 2009.  Gastroenterology was consulted and      recommended no repeat colonoscopy at this time.  2. Hypertension.  On admission, the patient's blood pressure was      128/71 and gradually increased over hospital course.  The patient      was restarted on metoprolol, but did not tolerate further blood      pressure medicines.  The patient was asked to discontinue other      blood pressure medicines until followup at the end of the week.  3. Diabetes mellitus.  The patient's Lantus was held due to n.p.o.      status early in hospital stay and was placed on sliding scale      insulin.  The patient's  CBGs remained well controlled during      hospital stay hovering around 100.  Since the patient is discharged      on clear liquids, we will hold Lantus until tolerating more solid      food.  We will continue metformin.   DISCHARGE INSTRUCTIONS:  Please call [facility] or return to  ED if you become lightheaded, dizzy, short of breath, or have chest pain  or continued GI bleeding.   FOLLOWUP APPOINTMENT:  The patient to return to [facility]  for a hemoglobin check on September 20, 2008, after 8:30 a.m.  The  patient to have followup with Dr. Ruthe Mannan on October 02, 2008, at  10:30 a.m.   DISCHARGE CONDITION:  Stable to home.      Delbert Harness, MD  Electronically Signed      Leighton Roach McDiarmid, M.D.  Electronically Signed    KB/MEDQ  D:  09/22/2008  T:  09/23/2008  Job:  [id number]   cc:   Ruthe Mannan, M.D.

## 2011-03-16 NOTE — Discharge Summary (Signed)
NAMEPATRIC, VANPELT NO.:  [id number]   MEDICAL RECORD NO.:  [id number]          PATIENT TYPE:  INP   LOCATION:  6712                         FACILITY:  [facility]   PHYSICIAN:  Wayne A. Sheffield Slider, M.D.    DATE OF BIRTH:  1956-06-11   DATE OF ADMISSION:  09/15/2008  DATE OF DISCHARGE:                               DISCHARGE SUMMARY   PRIMARY CARE PHYSICIAN:  Ruthe Mannan, MD, at the [facility].   CHIEF COMPLAINT:  Rectal bleeding.   HISTORY OF PRESENT ILLNESS:  This is a 55 year old patient with a  history of pandiverticulosis, who was at church at 1 p.m. when she had  some abdominal cramping and urge to have a bowel movement.  The patient  had a bowel movement that had gross blood in it.  She had another bowel  movement with gross blood at 2:30 p.m.  Associated symptoms include  shortness of breath, dizziness, and some abdominal pain, states that she  becomes lightheaded when she has a bowel movement.  No nausea, vomiting,  fever, chills, chest pain, diarrhea, dysuria, sick contacts.  One and  half weeks ago, the patient was prescribed Mobic 15 mg daily for pain.   The patient has a history of pandiverticulosis and was evaluated by Dr  Freida Busman, General Surgery.  They recommended that she get a colectomy but  Dr. Freida Busman wants the patient to lose 30 pounds before having a colectomy.  The patient is waiting to get a lap band so that she can lose the  weight.   In the ED, she was given IV fluids.   The last time the patient was admitted in March 2009 for similar problem  of rectal bleeding, her hemoglobin dropped quickly to 7, and the patient  was then transfused.  The patient was also coded because she had some  vagal response after having a bowel movement.   PAST MEDICAL HISTORY:  1. Aspirin/NSAIDs contraindicated because the patient has recurrent GI      bleed.  2. Pandiverticulosis, colonoscopy by Dr. Arlyce Dice in 2007.  3. Gastrointestinal  hemorrhage with a history of GI bleed when on      NSAIDs.  4. Rectal bleeding x2 while on treatment for knee pain in 2002.  5. GI bleed, recurrent x4.  6. Obesity.  7. Intermittent chest pain.  8. Hypertension, uncontrolled and complicated.  9. 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  10.Right kidney cyst, increased size on March 2008.  11.Low back pain.  12.Diabetes.   PAST SURGICAL HISTORY:  1. Abdominal CT, October 2008, showed renal cyst decreased size from 3      cm to 1.4 cm and from Bosniak IIF category to 1.  Other      radiologists did not have previous MRI for comparison.  2. 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  3. Colonoscopy in Mar 01, 2006, by Dr. Arlyce Dice.  4.  EGD was normal in 03-19-2006.  5. CT of the head in October 2006, which showed left putamen infarct.  6. Echo with ejection fraction of 60% in October 2006.  7. Exercise Cardiolite with ejection fraction of 57% and normal      perfusion in December 2006.  8. Complete hysterectomy in September 2005.  9. 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   FAMILY HISTORY:  Colon cancer, first degree; diabetes, first degree;  hypertension; stroke.  Hypertension in brother, diabetes in brother.   SOCIAL HISTORY:  Lives with two mentally retarded sisters, whom she  takes care of.  Quit smoking marijuana in 1999-03-20.  Currently, no tobacco  abuse, quit in 1998-03-19.  Mother died, 68 year old secondary to diabetes.  Father died secondary to colon cancer in 81s.   REVIEW OF SYSTEMS:  Per HPI.   PHYSICAL EXAMINATION:  VITAL SIGNS:  Temperature 98.0, heart rate 111,  blood pressure 140/96, second blood pressure is 120/71, respiratory rate  22, O2 sat 100% on air.  GENERAL:  No apparent distress, appears tired.  HEENT:  Normocephalic, atraumatic.  Mouth, moist mucous membranes.  NECK:  No cervical nodes.   CVS: Tachy, otherwise regular rhythm.  Cap refill 2-3 seconds.  RESPIRATORY: Clear to auscultation bilaterally.  No wheezing, rales, or  rhonchi.  ABDOMEN:  Obese, mildly generalized tenderness.  Hyperactive bowel  sounds.  EXTREMITIES: +2 dorsalis pedis pulses bilaterally.  No pitting edema.  NEUROLOGIC:  Nonfocal.   LABORATORY DATA:  I-STAT labs showed hemoglobin 11.1, hematocrit 34.  Sodium 142, potassium 3.7, chloride 107, CO2 of 27, BUN 23, creatinine  1.1, glucose 145.  CBC shows Zilberman blood cells 7, hemoglobin 11.3,  hematocrit 34.7, platelets 282, neutrophils 64%.   ASSESSMENT AND PLAN:  This is a 55 year old female with bright red blood  per rectum and history of pandiverticulosis.  1. Bright red blood per rectum/pandiverticulosis.  We will type and      screen 2 units of packed red blood cell in anticipation of      decreased hemoglobin.  Plan to get serial H&H q.4 h.  If hemoglobin      is less than 10, we will transfuse.  If the patient's hemoglobin is      less than 4, we will transfuse and call Surgery.  Gastroenterology      was consulted and advised that we should consult Surgery.  Surgery      was consulted and Dr. Lindie Spruce is aware of the patient's status.  He      will see her today.  The patient is n.p.o. and coags ordered in      case the patient needs to go the surgery, [facility] in a.m.  2. Hypertension.  Blood pressure currently stable.  We will hold all      antihypertensive meds for now since the patient is n.p.o.  3. Diabetes.  Sliding scale insulin moderate scale.  Hold Lantus since      the patient is n.p.o.  4. Fluids, Electrolytes, Nutrition/Gastrointestinal:  D5 one-half      normal saline at 150 mL/hour n.p.o. for diet.  5. Prophylaxis.  No anticoagulation for now since the patient is      actively bleeding and may require emergent surgery.  6. Disposition.  Pending resolution of bleeding.   FOLLOWUP:  Surgery consult.  Followup GI consult.   HOME  MEDICATIONS:  1. Accupril 40 mg 1 tablet p.o. once a day.  2. Metformin 500  mg 1 tablet p.o. twice daily.  3. Metoprolol 200 mg 1 tablet p.o. once daily.  4. Clonidine 0.2 mg 1 tablet p.o. twice daily.  5. Hydrochlorothiazide 25 mg p.o. once daily.  6. Tiazac 360 mg to take 1 tablet p.o. daily.  7. Colace 100 mg take 1 tablet by mouth daily.  8. Ferrous sulfate 325 mg to take 1 tablet by mouth 3 times daily.  9. Lasix 200 mg 1 tablet by mouth as needed for swelling.  10.Cozaar 50 mg 1 tablet by mouth daily.      Angeline Slim, MD  Electronically Signed      Arnette Norris. Sheffield Slider, M.D.  Electronically Signed    CT/MEDQ  D:  09/16/2008  T:  09/16/2008  Job:  [id number]

## 2011-03-16 NOTE — Assessment & Plan Note (Signed)
[facility]                         GASTROENTEROLOGY OFFICE NOTE   NAME:Hannah, [name]                       MRN:          [id number]  DATE:03/22/2007                            DOB:          [birth year]    REFERRING PHYSICIAN:  Henri Medal, MD   HISTORY:  Mrs. [name] is seen today on referral from Dr. Ludwig Clarks.  She  has been seen by Dr. Melvia Heaps previously.  She underwent  colonoscopy Dr. Arlyce Dice in 2007 showing pandiverticulosis and an EGD by  me in 4/08 which was normal. She has a history of recurrent diverticular  bleeding and was hospitalized for a recurrent diverticular bleed and  anemia in April 2008.  Please see the enclosed discharge summary and  consultation from Dr. Jaclynn Guarneri.  She has had no recurrent bleeding  since discharge.  She has no GI complaints today.   CURRENT MEDICATIONS:  Listed on the chart - updated and reviewed.   MEDICATION ALLERGIES:  ASPIRIN LEADING TO BLEEDING.   PHYSICAL EXAMINATION:  Obese African-American female in no acute  distress.  Height 5 feet 6 inches.  Weight 307.6 pounds.  Blood pressure  is 136/90, pulse 76 and regular.  She is not reexamined.   ASSESSMENT AND PLAN:  History of recurrent diverticular bleeding.  She  is advised to maintain a long-term high fiber diet.  If she has  recurrent diverticular bleeding, consideration should be given for  subtotal colectomy as discussed in Dr. Jamse Mead hospital consultation  note dated February 20, 2007.  Further gastroenterology followup with Dr.  Melvia Heaps and Dr. Jaclynn Guarneri as needed.     Venita Lick. Russella Dar, MD, [facility]  Electronically Signed    MTS/MedQ  DD: 03/22/2007  DT: 03/22/2007  Job #: [id number]   cc:   Henri Medal, MD  Barbette Hair. Arlyce Dice, MD,FACG

## 2011-03-16 NOTE — Consult Note (Signed)
[name], NORDHOFF NO.:  [id number]   MEDICAL RECORD NO.:  [id number]          PATIENT TYPE:  INP   LOCATION:  6703                         FACILITY:  [facility]   PHYSICIAN:  Lennie Muckle, MD      DATE OF BIRTH:  Feb 06, 1956   DATE OF CONSULTATION:  01/16/2008  DATE OF DISCHARGE:                                 CONSULTATION   REQUESTING PHYSICIAN:  Leighton Roach McDiarmid, M.D.   PRIMARY CARE PHYSICIAN:  The patient no longer has a primary care  physician because her other one moved away.  However, she does go to  [facility] and saw Dr. Luz Brazen yesterday.   REASON FOR CONSULTATION:  Gastrointestinal bleed.   HISTORY OF PRESENT ILLNESS:  This is a 55 year old black female who has  a history of multiple diverticular bleeds for which she has had prior  colonoscopy, hypertension, CVA, and obesity.  The patient presented to  the ER last night saying that she had a loose bloody bowel movement at  around 1700 p.m. yesterday, March 16.  She states that this bowel  movement was covered in bright red blood, and there is about 1/2 cup of  blood in the toilet.  She states the later on in the day she had several  more of these episodes.  She states that this time she had abdominal  pain that she would describe as crampy and periumbilical pain.  She also  states his bowel movement seemed to relieve some of this pain.  When she  was here in the ER, she was tested and found to be Hemoccult positive.  Once here, she was also found to have a hemoglobin of 10.2 and  apparently was later transfused. Her hemoglobin today is 11.4.  The  patient currently states that she is no longer having any abdominal  pain.  She also states that she is no longer bleeding.  Because of the  patient's recurrent GI bleeding, we were consulted for possible surgical  intervention.   REVIEW OF SYSTEMS:  See HPI.  The patient denies any chest pain or  shortness of breath, but she does  admit to some weakness, dizziness and  lightheaded with standing.  Otherwise, all other systems are negative.   FAMILY HISTORY:  Noncontributory.   PAST MEDICAL HISTORY:  1. Recurrent GI bleed.  2. Pan diverticulosis.  3. Obesity.  4. Hypertension.  5. History of CVA.  6. Low back pain.   PAST SURGICAL HISTORY:  1. Hysterectomy.  2. Bilateral total knee replacements.   SOCIAL HISTORY:  The patient currently lives with her two sisters.  She  states that in 2000 she quit smoking marijuana and 1999 quit using  tobacco.  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   ALLERGIES:  ASPIRIN which tends to cause GI bleeding.   MEDICATIONS:  1. Accupril 40 mg.  2. Metformin 500 mg.  3. Metoprolol succinate 200 mg .  4 . Clonidine HCL 0.2 mg.  1. Vicodin 5/325 as needed for pain.   PHYSICAL EXAMINATION:  GENERAL:  This  is a pleasant obese 55 year old  black female who is lying in bed in no acute distress.  VITAL SIGNS:  Temperature 97.6, 79, respirations 20, blood pressure  123/76.  HEENT:  Eyes:  Sclerae not injected.  Pupils are equal, round and  reactive to light.  Ears, nose, mouth, and throat:  Ears and nose with  no obvious lesions, masses or rhinorrhea.  Mouth is pink and moist.  Throat shows no exudates.  NECK:  Supple.  Trachea is midline.  No thyromegaly is noted.  LUNGS:  Clear to auscultation bilaterally with no wheezes, rhonchi or  rales noted.  Respiratory effort is normal.  HEART:  Regular rate and rhythm.  Normal S1-S2.  No murmurs, gallops or  rubs noted, +2 bilateral carotid and pedal pulses.  CHEST:  Symmetrical.  ABDOMEN:  Soft nontender and obese with positive bowel sounds.  There is  no guarding, rebounding or masses or hernias noted.  MUSCULOSKELETAL:  All four extremities are symmetrical with no cyanosis  or clubbing.  There is +1 pitting edema in bilateral lower extremities.  There are no joint effusions in any joints.  SKIN:  There are no obvious  rashes, lesions or masses.  NEUROLOGIC:  Cranial nerves II-XII are grossly intact. Reflex exam is  deferred at this time  PSYCH:  The patient is alert and oriented x3 with appropriate affect.   LABORATORY DATA:  Hemoccult positive on admission which was on March 16.  The patient's Cure blood cell count is 10,600 which has increased from  7500 yesterday. Hemoglobin 11.4.  Platelets were 260,000.  Sodium was  41, potassium 3.9, glucose 106, BUN 6, creatinine 0.7.  LFTs are normal.  INR is 1.0.  There are currently no other diagnostic studies.   IMPRESSION:  1. Recurrent gastrointestinal bleeding.  2. Pan diverticulosis.  3. Obesity.  4. Hypertension.  5. History of cerebrovascular accident.  6. Acute blood loss anemia.   PLAN:  At this time the patient is currently no longer bleeding, and,  therefore, surgery is not indicated.  However, if the patient begins to  rebleed, then we would recommend nuclear medicine bleeding scan or a  mesenteric arteriogram to determine the location of the patient's GI  bleed.  If the patient were to rebleed and location of the bleeding was  to  be determined, I feel as though she would benefit from either  embilization of the artery or colonoscopy with injection of the area.  She is clinically stable and not requiring massive amounts of  transfusion.  I would recommend surgery as a last resort if the bleeding  cannot be controlled with these other measures.      Letha Cape, PA      Lennie Muckle, MD  Electronically Signed    KEO/MEDQ  D:  01/16/2008  T:  01/16/2008  Job:  [id number]   cc:   Barbette Hair. Arlyce Dice, MD,FACG  Leighton Roach McDiarmid, M.D.

## 2011-03-19 NOTE — Discharge Summary (Signed)
NAMEGALI, SPINNEY NO.:  [id number]   MEDICAL RECORD NO.:  [id number]          PATIENT TYPE:  INP   LOCATION:  4713                         FACILITY:  [facility]   PHYSICIAN:  Henri Medal, MDDATE OF BIRTH:  01/09/1956   DATE OF ADMISSION:  08/31/2005  DATE OF DISCHARGE:  09/01/2005                                 DISCHARGE SUMMARY   ATTENDING PHYSICIAN:  Santiago Bumpers. Hensel, M.D.   DISCHARGE DIAGNOSES:  1.  Hypertensive urgency.  2.  Hypokalemia.  3.  Obesity.   DISCHARGE MEDICATIONS:  1.  Accupril 40 mg one tablet p.o. daily.  2.  Hydrochlorothiazide 25 mg one tablet p.o. daily.  3.  K-Dur 20 mEq one tablet p.o. daily.  4.  Metoprolol XL 100 mg one tablet p.o. daily.   FOLLOW UP APPOINTMENT:  Dr. Henri Medal, M.D., at [facility], on September 20, 2005, at 2 p.m.   SPECIAL INSTRUCTIONS:  Restrict salt intake to less than 2 g daily.  Return  to the ER/Family Practice Center if headache, change of vision, chest pain,  shortness of breath, muscle weakness.   PROCEDURE:  Head CT with no acute findings.  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   BRIEF HISTORY AND PHYSICAL:  A 55 year old African American female who came  to the [facility] complaining of blurred vision, headache for  the past three days.  The patient stated she usually gets these headaches  secondary to increased blood pressure.  Patient does have a history of  migraines but she said that the migraines present differently.  Patient  denied shortness of breath, chest pain, or recent onset of weakness.  Patient does state that she has noticed a slight weakness in her right upper  extremity for the past several months.  The review of systems was negative.   PHYSICAL EXAMINATION:  GENERAL APPEARANCE:  Patient was in moderate distress  from headache.  Patient was oriented x3.  VITAL SIGNS:  Blood pressure was 190/120.  HEENT:  Unremarkable.  CHEST:  Unremarkable.  CARDIOVASCULAR:  Regular rate and rhythm, normal S1 and S2, no murmurs.  ABDOMEN:  Unremarkable.  EXTREMITIES:  Patient presented with 1+ pitting edema and 2+ peripheral  pulses.  NEUROLOGIC:  Right side facial droop.  This right facial droop was  questionable, and decreased strength (subtle) on right limb especially on  the grip strength.  DTRs were 2+ x4.  Patient also found to have decreased  sensation of right upper extremity.  Rest of neurological exam was  unremarkable.   ADMISSION LABORATORY DATA:  Sodium 137, potassium 3.3, chloride 105, CO2 25,  BUN 13, creatinine 0.9, glucose 89, calcium 8.8.  WBC 7.4, hemoglobin 12.7,  hematocrit 38.1, platelets 303, MCV 88.5.   Patient was admitted for hypertensive urgency and to rule out CVA.   HOSPITAL COURSE BY PROBLEM:  PROBLEM #1 -  HYPERTENSIVE URGENCY:  Patient  was placed on Nicardipine 20 mg one tablet p.o. t.i.d. p.r.n. for diastolic  blood pressure greater than 120s.  Metoprolol XL was increased from 50  mg  one tablet p.o. daily to 100 mg one tablet p.o. daily.  Accupril was  decreased from 80 mg one tablet p.o. daily to 40 mg one tablet p.o. daily.  Hydrochlorothiazide was continued at regular doses.  Blood pressures on this  patient after admission, decreased to 147/94.  An EKG showed normal results.  A head CT scan showed no acute changes and a left putamen lacunar infarction  (old).  By September 01, 2005, patient was clinically stable.  Neurological  exam was unremarkable.  Patient does have a history of right upper extremity  weakness (subjective), noticed by the patient but not noticeable on physical  examination.  Patient does have a history of right bursitis which was  treated with local steroid injection approximately two weeks ago by an  orthopedist.  The right facial weakness/droop on admission was questionable  and very subtle.  I doubt that this patient had a CVS during this admission.  EKG  was within normal limits, creatinine is at baseline 0.9, ruling out end  organ damage.  This is a well-known patient by me.  A fasting lipid profile,  CBGs (fasting) ordered at the [facility] were within  normal limits, ruling out diabetes and hyperlipidemia in this obese patient.  Metoprolol XL and Accupril doses have been adjusted for better control of  blood pressure from this patient.  I will start ASA 325 mg one tablet p.o.  daily given the findings on CT scan of an old left lacunar infarction  Since  patient is clinically stable, patient was discharged home with blood  pressure medication doses adjusted.   PROBLEM #2 -  HYPOKALEMIA:  Repleted.  Potassium at discharge was 3.7.      Henri Medal, MD     FIM/MEDQ  D:  09/01/2005  T:  09/02/2005  Job:  [id number]   cc:   William A. Leveda Anna, M.D.  Fax: [phone]

## 2011-03-19 NOTE — Discharge Summary (Signed)
[facility]  Patient:    [name], [name]                       MRN: [id number] Adm. Date:  [id number] Disc. Date: [id number] Attending:  Orland Mustard CC:         Lum Babe, M.D.   Discharge Summary  ADMITTING DIAGNOSIS:  Lower gastrointestinal bleed.  FINAL DIAGNOSIS:  Lower gastrointestinal bleed felt secondary to diverticulosis.  PROCEDURE:  Colonoscopy, May 02, 2000.  PERTINENT HISTORY:  Nice 55 year old black female patient of Dr. Demetrius Revel, who had no history of GI bleeding, went to bed and woke up with cramping abdominal pain and bloody bowel movement, presented to the emergency room.  Hemoglobin was 12.0.  The patient had grossly positive stools.  PHYSICAL EXAMINATION:  VITAL SIGNS:  Remarkable for normal vital signs.  HEART/LUNGS:  Normal.  ABDOMEN:  Soft, hyperactive bowel sounds, and nontender.  RECTAL:  Bright blood on the examining finger.  For more details, please see dictated admission history and physical.  HOSPITAL COURSE:  The patient was admitted to a medical floor and was prepped for a colonoscopy.  On May 02, 2000, colonoscopy was performed showing pandiverticulosis with no active bleeding.  The terminal ileum was entered and was normal.  The patients hemoglobin dropped to 9.6.  Her bleeding seemed to stop.  The following morning, on July 3, she was having no further bleeding. Hemoglobin had dropped slightly.  She was seen by Dr. Laural Benes and felt to be in satisfactory condition for discharge.  DISPOSITION:  The patient is discharged home on a regular diet.  Will continue on all of her outpatient medicines and stay on Dulcolax and Metamucil and to avoid aspirin. DD:  08/09/00 TD:  08/10/00 Job: [id number]

## 2011-03-19 NOTE — Consult Note (Signed)
NAMELYN, JOENS NO.:  [id number]   MEDICAL RECORD NO.:  [id number]          PATIENT TYPE:  INP   LOCATION:  3706                         FACILITY:  [facility]   PHYSICIAN:  Sharlet Salina T. Hoxworth, M.D.DATE OF BIRTH:  1956-04-05   DATE OF CONSULTATION:  02/20/2007  DATE OF DISCHARGE:                                 CONSULTATION   PRIMARY CARE PHYSICIAN:  Dr. Ludwig Clarks   GI:  Dr. Russella Dar   REASON FOR CONSULTATION:  Recurrent lower GI bleeding.   HISTORY OF PRESENT ILLNESS:  The patient is a 55 year old female patient  with a history of GI bleeding secondary to tics.  She has undergone  several GI evaluations beginning in 2001.  Her last colonoscopy was  during an episode of bleeding experienced during May of 2007.  At that  time, she was found to have pan diverticulitis of the colon without any  active bleeding.  At that point, she was treated with stool softeners  and iron and was monitored for recurrent bleeding with recommendations  by GI that the patient undergo a total colectomy if she develops further  bleeding.  The patient has done well since then until April 17 where she  had recurrent bleeding.  She was initially seen at [facility]  and sent over to [facility] for additional evaluation.  At that time,  her stools were Hemoccult positive.  She underwent an EGD on February 19, 2007 which was normal.  She has had no further rectal bleeding.  Her  hemoglobin is stable, and most recent fecal occult blood is negative.  GI notes after the EGD that the patient's bleeding is probably due to  recurrent diverticular disease.  Teaching service has admitted the  patient, and they request surgical evaluation for possible colectomy.   REVIEW OF SYSTEMS:  As above.  The patient does report that when she  begins to have bleeding symptoms, she has diffuse, cramping, abdominal  pain.   ALLERGIES:  Aspirin which causes GI bleeding.   CURRENT MEDICATIONS:  While  hospitalized, include sliding scale insulin,  hydrochlorothiazide, Toprol, Protonix, and various p.r.n. medications.   PAST MEDICAL HISTORY:  1. Hypertension.  2. Obesity.  3. Iron deficiency anemia.  4. Diverticular disease with prior GI bleeds.  5. Chronic venous insufficiency.  6. Pre-diabetes.   PAST SURGICAL HISTORY:  Abdominal hysterectomy via low Pfannenstiel  incision.   SOCIAL HISTORY:  The patient lives alone.  She is a foster parent.  She  does utilize tobacco products but very minimally.  She has social  alcohol use.   PHYSICAL EXAMINATION:  GENERAL:  Pleasant female patient.  Currently  denies active GI bleeding, but recent hospitalizations this week for  lower GI bleeding.  VITAL SIGNS:  Temperature 97.2, blood pressure 170/102, pulse 78 and  regular, respirations 18.  NEUROLOGIC:  The patient is alert and oriented times 3 moving all  extremities times 4.  No focal deficits.  HEENT:  Head normocephalic.  Sclerae not injected.  NECK:  Supple.  No adenopathy.  CHEST:  Bilateral lung sounds clear to  auscultation.  Respiratory effort  is non-labored.  She is on room air.  CARDIAC:  S1 and S2.  No rubs, murmurs, thrills, or gallops.  Pulses  regular.  No jugular venous distention.  ABDOMEN:  Obese, soft, nontender, and nondistended without  hepatosplenomegaly, masses, or bruits noted.  EXTREMITIES:  Symmetrical in appearance without edema, cyanosis, or  clubbing.   LAB:  April 19, fecal occult blood was positive.  April 21, is negative.   Sodium is 143, potassium 3.6, CO2 of 27, glucose 91, BUN 2, creatinine  0.60.  Hemoglobin today is 9.2, hematocrit 26.5, Caillier count 6,600 on  February 19, 2007.  On April 17, PTT 30, PT 13.7, INR 1.0.   IMPRESSION:  1. Recurrent lower GI bleeding.  2. Known pan colonic tics.  3. Known pan colonic diverticular disease.  4. Anemia secondary to blood loss.  5. Hypertension.   PLAN:  1. From a bleeding standpoint, she is stable.   No active bleeding at      the present time, and hemoglobin is stable.  2. Discussed with the patient implications of total colectomy.      Depending on diverticular involvement of the colon, we may or may      not be able to achieve a primary anastomosis and avoid ileostomy.      This all depends on sigmoid rectal involvement and issues regarding      prior surgery given adhesions and need to work deep in the pelvis      to achieve an ileorectal anastomosis if indicated.  3. Further discussion about this issue per Dr. Johna Sheriff after he      examines the patient, reviews any prior scans and/or colonoscopy      reports, and discusses this with GI.  Dr. Arlyce Dice was the last      person to perform a colonoscopy on her.  If surgery is indicated,      uncertain if we need to go ahead and proceed this admission since      she is not actively bleeding versus set this up electively.      Allison L. Rennis Harding, N.P.      Lorne Skeens. Hoxworth, M.D.  Electronically Signed    ALE/MEDQ  D:  02/20/2007  T:  02/20/2007  Job:  [id number]   cc:   Barbette Hair. Arlyce Dice, MD,FACG  Dr. Ludwig Clarks

## 2011-03-19 NOTE — Op Note (Signed)
[facility]  Patient:    [name], [name]                       MRN: [id number] Proc. Date: 05/02/00 Adm. Date:  [id number] Attending:  Orland Mustard CC:         Lum Babe, M.D.                           Operative Report  PROCEDURE:  Colonoscopy.  MEDICATIONS:  Fentanyl 100 mcg, Versed 12 mm IV.  INDICATION:  Acute lower GI bleeding.  DESCRIPTION OF PROCEDURE:  The procedure had been explained to the patient and consent obtained.  The adult Olympus video colonoscopy was used.  Digital exam was performed, the adult scope was inserted.  Immediately upon entering the colon, the patient was seen to have old coffee-ground material throughout. She had extensive diverticular disease that really extended from the sigmoid colon all the way to the cecum.  No area was particularly worse than others. It was in essence pandiverticulosis with multiple large and small-mouth diverticula.  There were none that I could see actively bleeding.  All were irrigated and this appeared to be old clots and coffee-ground material and no signs of bright red blood.  The terminal ileum was entered for approximately 10 cm and there was no blood in the terminal ileum.  The scope was withdrawn and the cecum, ascending colon, hepatic flexure, transverse colon, splenic flexure, descending and sigmoid colon revealed the pandiverticulosis with no signs of active bleeding.  There was one area in the sigmoid where I thought there was a polyp, 2-3 mm, within a diverticulum.  There was a lot of old blood and I could not be entirely sure.  The scope was withdrawn.  The patient tolerated the procedure well.  She was maintained on low-flow oxygen pulse oximetry throughout the procedure.  ASSESSMENT: 1.   Pandiverticulosis without bleeding actively.  I suspect that this is due      to diverticular bleeding.  The terminal ileum was clear. 2.   Family history of colon cancer with a  possible polyp within the      diverticulum that was seen only transiently.  PLAN: 1.   I will follow the patient clinically.  If she continues to bleed, she may      need an angiogram or subtotal colectomy. 2.   I will put her on a follow up list for two years to repeat her      colonoscopy. DD:  05/03/99 TD:  05/03/00 Job: [id number]

## 2011-03-19 NOTE — H&P (Signed)
NAMEWM, FRUCHTER NO.:  [id number]   MEDICAL RECORD NO.:  [id number]          PATIENT TYPE:  INP   LOCATION:  3706                         FACILITY:  [facility]   PHYSICIAN:  Alanda Amass, M.D.   DATE OF BIRTH:  03-Jan-1956   DATE OF ADMISSION:  02/16/2007  DATE OF DISCHARGE:                              HISTORY & PHYSICAL   CHIEF COMPLAINT:  Bleeding.   HISTORY OF PRESENT ILLNESS:  Patient is a 55 year old with a history of  hypertension, diabetes, GI bleeds x2 for diverticulosis here with a new  GI bleed, went to [facility] ED and was sent here.  Since 9:30 p.m.  last night, she started having large amounts of blood in her stool and  passing large clots approximately every two minutes or so, she thinks  she went at least ten times, stopped bleeding at 3:00 p.m. today with  vomiting brownish Gupton color soon after she started bleeding through  her bottom, but there was no blood in her vomit that she could see.  No  bowel movement since 3 o'clock today.  History is positive for some  dizziness starting at 11:00 p.m. last night which was better when she  laid down., no sick contacts, no NSAID use only Tylenol, complaining of  abdominal pain, but no dizziness now.  Last hospitalized May of 2007 for  GI bleed.  At that point, she did have a colonoscopy and GI was  consulted and felt that she might need a colectomy, but decided to D/C  her home with iron and MiraLax.  In the ED at [facility], she received  a normal saline bolus of 500 mL and then 100 mL per hour as well as an  800 mL bolus.  She ran out of MiraLax one week ago.  Her stools were  getting a little harder since that point.   REVIEW OF SYSTEMS:  No fevers, no weight changes, no chest pain, no  shortness of breath.  Has had a little bit of a chronic cough for the  past five months with some Heaton sputum.  GI:  See above HPI.  Is having  a little bit of abdominal pain as well.   PAST MEDICAL HISTORY:   Includes:  1. Hypertension.  2. Obesity.  3. Iron deficiency anemia.  4. History of GI bleeds and diverticulosis on colonoscopies.  5. Chronic venous insufficiency.  6. Also was hospitalized in November of 2006 for hypertensive urgency.  7. Also has a history of diabetes it looks like per her last      hospitalization.  8. Her first hospitalization for her GI bleeds was five years ago,      this is her third GI bleed total.   MEDICATIONS:  Include:  1. Accupril 40 mg daily.  2. Diltiazem XL 240 mg daily.  3. Ferrous sulfate 325 mg daily.  4. HCTZ 25 mg daily.  5. Metformin 500 b.i.d.  6. Metoprolol XL 200 daily.  7. Tylenol-PM at night.  8. MiraLax one capful b.i.d.  Patient said she was taking it once a  day and ran out a week ago.   ALLERGIES:  ASPIRIN CAUSES GI BLEED.   Patient's EGD July of 2001 showed hiatal hernia with esophagitis.   Colonoscopy of July of 2001 showed pan-diverticulosis and no active  bleeding.   OTHER PROCEDURES:  She has had:  1. A hysterectomy in 1995.  2. Knee replacement bilaterally in 1998.  3. Echo with ejection fraction of 60% showing mild-to-moderate LVH in      October of 2006.  4. Exercise Cardiolite showing ejection fraction of 67% with normal      perfusion.  5. 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   FAMILY HISTORY:  Mom and brothers have diabetes, mom also has  hypertension, mom had stroke in the 36's.   SOCIAL HISTORY:  Patient lives alone and takes care of foster children,  has alcohol on the weekends and has a son who is age 41.  She did stop  smoking marijuana in approximately 1999, denies any drug use or smoking  at this time.   VITAL SIGNS:  Temperature is 98.1, heart rate 81, respiratory rate 20,  blood pressure 128/86, 100% on room air.  GENERAL APPEARANCE:  Alert and oriented x3 in no acute distress.  HEAD:  Normocephalic.  Conjunctivae appear pink.  EYES:  Extraocular muscles intact.   Pupils equal, round and reactive to  light and accommodation.  MOUTH/THROAT:  No erythema or exudates.  LUNGS:  Clear to auscultation bilaterally.  HEART:  Regular rate and rhythm.  No murmurs, rubs or gallops.  ABDOMEN:  Soft, nondistended, but tender to palpation, especially at the  lower part of her abdomen.  EXTREMITIES:  Trace edema at sock line bilaterally.  PULSES:  +2 pulses medial and radial.  NEUROLOGICAL:  Cranial nerves II-XII grossly intact.  SKIN:  Without any lesions noted.   LABS/TESTS:  Sodium 140, potassium 3.2, chloride 105, bicarb 25, BUN 18,  creatinine 0.98 and glucose 120.  Calcium 9.1, Dagley blood cell count  8.1, hemoglobin 10.3, hematocrit 30.5, platelets 285, neutrophils 66,  lymphocytes 23, monocytes. PT 13.7, INR 1.0, PTT 30.   ASSESSMENT/PLAN:  Patient is a 55 year old African-American female with  a history of hypertension, two GI past bleeds due to diverticulosis here  with another GI bleed.   1. GI bleed.  Patient has diverticulosis on her colonoscopy.  She is      not showing any signs of bleeding now.  Will recheck a CBC now and      every four hours to ensure she is not still bleeding also.  Will      also start Protonix 40 mg IV and give her a clear liquid diet, may      need a colonoscopy in the a.m.  Will type and cross and hold two      units.  Will restart her MiraLax since stopping this may have      helped precipitate this bleed.  Watch blood pressure closely.  Due      to no rebound or guarding on abdominal exam or increased Jeudy      blood cells or fever, we will not treat with antibiotics yet at      this time, but will monitor closely for these things incase she      needs antibiotics for possible diverticulitis.  2. Hypertension.  We will hold this medication for now.  Currently      normotensive, but due to possibility of  bleeding that could cause     decreased blood pressure.  We will wait until the morning to decide      to  restart.  Patient was on Accupril, HCTZ, metoprolol and      Diltiazem at home.  3. Diabetes.  Will stop metformin since in the hospital and check CBGs      q.a.c. and q.h.s. and give moderate sliding-scale insulin coverage.  4. FEN/GI:  Will give normal saline 150 mL per hour since NPO and      recent bleeding.  5. Pain.  Will give Tylenol p.r.n.  6. Insomnia.  Will give Tylenol-PM q.h.s. p.r.n. __________takes this      at home.  7. Disposition.  We will consult GI in the morning.  Patient currently      not bleeding, will possibly need colonoscopy prior to discharge,      colectomy versus just conservative management with iron and      MiraLax.           ______________________________  Alanda Amass, M.D.     JH/MEDQ  D:  02/18/2007  T:  02/18/2007  Job:  [id number]

## 2011-03-19 NOTE — H&P (Signed)
[place]  Patient:    [name], [name]                         MRN: [id number] Adm. Date:  05/01/00 Attending:  Fayrene Fearing L. Randa Evens, M.D. CC:         [name], M.D.                         History and Physical  REASON FOR ADMISSION:  GI bleeding.  HISTORY:  A nice 55 year old black female, patient of Dr. Lum [name], who has had no prior history of ulcer disease or GI bleeding.  She felt fine yesterday, has normal bowel movements, no chronic constipation or diarrhea, and actually had a normal bowel movement yesterday.  She went to bed, woke up with cramping abdominal pain and went to the bathroom and had a bloody bowel movement; it was all bright blood with stool mixed in.  She has had a total of four bloody bowel movements that are now in essence just pure blood with clots since early this morning around 2 oclock and came to the emergency room. Evaluation in the emergency room revealed a hemoglobin of 12.0 and grossly positive stool.  Patient has had some abdominal cramping but has had no further bowel movements since being in the emergency room.  CURRENT MEDICATIONS: 1. Norvasc 10 mg q.d. 2. Vioxx 25 mg q.d. 3. Prinzide 20/12.5 mg two tablets q.d.  ALLERGIES:  She has no drug allergies.  MEDICAL HISTORY: 1. Hypertension, treated with Norvasc and Prinzide. 2. Status post bilateral total knee replacements, requiring Vioxx. 3. Status post hysterectomy.  FAMILY HISTORY:  Mother has hypertension and diabetes.  Father died around 71 of colon cancer.  No other history of cancer.  SOCIAL HISTORY:  She is divorced but has one son who is healthy and grown; she lives with her son.  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  She drinks alcohol occasionally and does not smoke.  REVIEW OF SYSTEMS:  Remarkable for lack of any cardiac or pulmonary problems. she has never had ulcers or any problems with  her liver of which she is aware.  PHYSICAL EXAMINATION:  VITAL SIGNS:  Patient is afebrile.  Pulse 89.  Blood pressure 143/107.  GENERAL:  She is an alert, pleasant black female.  HEENT:  Sclerae nonicteric.  Extraocular movements intact.  Ears, nose and throat:  Grossly normal.  NECK:  Supple.  No lymphadenopathy.  LUNGS:  Clear.  HEART:  Regular rate and rhythm.  Without murmurs or gallops.  ABDOMEN:  Soft with somewhat hyperactive bowel sounds, with some mild tenderness in the right lower quadrant.  No guarding, rigidity or rebound.  RECTAL:  Bright blood on the examining finger.  PERTINENT LABORATORY DATA:  BUN 10, WBC 5.7 and hemoglobin of 12.0.  ASSESSMENT: 1. Acute gastrointestinal bleeding, probably lower gastrointestinal based on    history. 2. Hypertension.  PLAN: 1. Will admit and perform a colonoscopy tomorrow.  I think this is necessary    due to the patients strong family history of colon cancer and her acute    bleeding. 2. Will continue her on her blood pressure medicine. DD:  05/01/00 TD:  05/01/00 Job: [id number]

## 2011-03-19 NOTE — Discharge Summary (Signed)
[name], [name] NO.:  [id number]   MEDICAL RECORD NO.:  [id number]          PATIENT TYPE:  INP   LOCATION:  3706                         FACILITY:  [facility]   PHYSICIAN:  Melina Fiddler, MD DATE OF BIRTH:  1955-12-17   DATE OF ADMISSION:  02/16/2007  DATE OF DISCHARGE:  02/18/2007                               DISCHARGE SUMMARY   DISCHARGE DIAGNOSES:  1. Bright red blood from rectum.  2. Known history of diverticulosis.  3. Hypertension.  4. Obesity.  5. Iron deficiency anemia.  6. Chronic venous insufficiency.  7. Borderline diabetes.   PROCEDURES:  The patient received 1 unit of packed red blood cells the  morning of April 19.   CONSULTANT:  None.   DISCHARGE MEDICATIONS:  1. Iron sulfate 325 mg by mouth daily.  2. MiraLax 17 grams dissolved  in water by mouth twice daily.  3. Metformin 500 mg p.o. b.i.d.   Please note, the patient's home hypertension medicines, including  Accupril, Toprol XL, hydrochlorothiazide and Diltiazem were held at the  time of discharge due to her hypotension while in the hospital.  The  patient will follow up in 48 hours at [facility] family practice center  to be instructed on whether to restart these hypertension medications.   HOSPITAL COURSE:  [name] is a 55 year old African American female  admitted numerous times for lower GI bleed thought to be secondary to  diverticulosis.  She presented to the emergency room with a 1-day  history of blood in stool about every 2 minutes.  The blood was bright  red and came out in large clots.  She complained of dizziness with  standing but did not have any abdominal pain.  On admission she received  several fluid boluses and her admission hemoglobin was found to be 10.3.  She was observed overnight and her hemoglobin the morning of April 18  was 9.0.  Her hemoglobin had dropped likely secondary to dilutional  factor.  She reported that the bright red blood per rectum had  stopped,  however she did complain of melanotic stools and was still symptomatic  with extreme dizziness and hand numbness and tingling.  She was  maintained in the hospital another day and received more IV fluids.  On  the morning of discharge her hemoglobin is 8.4.  Her symptoms are  improved, however she still remains with some mild dizziness with  standing.  Given that she remains symptomatic she will receive 1 unit of  packed red blood cells prior to discharge.  She will return to [facility] on Monday April 21 and was instructed to  call for a walk-in appointment to have her hemoglobin checked.   FOLLOW-UP INSTRUCTIONS:  As mentioned above, [name] will return to  [facility] center on April 21 for hemoglobin check.  She  should also have a blood pressure check and will be instructed on  whether or not to continue her home hypertension medications.  She also  has an appointment with Dr. Ludwig Clarks at  family practice  center on April  30 at 8:30 in the morning.  She has also been established an appointment  with Dr. Russella Dar with [facility] gastroenterology on Mar 08, 2007 at 10:15 in  the morning.     ______________________________  Sylvan Cheese, M.D.    ______________________________  Melina Fiddler, MD    MJ/MEDQ  D:  02/18/2007  T:  02/19/2007  Job:  [id number]   cc:   Venita Lick. Russella Dar, MD, Clementeen Graham

## 2011-03-19 NOTE — Discharge Summary (Signed)
[name], DENES NO.:  [id number]   MEDICAL RECORD NO.:  [id number]          PATIENT TYPE:  INP   LOCATION:  3706                         FACILITY:  [facility]   PHYSICIAN:  Leighton Roach McDiarmid, M.D.DATE OF BIRTH:  08/01/1956   DATE OF ADMISSION:  02/16/2007  DATE OF DISCHARGE:  02/21/2007                               DISCHARGE SUMMARY   ADDENDUM:   HOSPITAL COURSE:  The patient was set to be discharged on February 18, 2007; however, she had a recurrence of her lower GI bleeding just prior  to discharge.  As a result, gastroenterology was consulted, and Dr.  Russella Dar with [facility] performed an endoscopy on February 20, 2007 to rule  out any upper GI bleed.  The upper endoscopy was completely normal, and  her bleed is therefore currently being attributed to her history of  diverticulosis.  Given that she has had several admissions for  diverticular bleeds within the past several years, Dr. Johna Sheriff with  [facility] was consulted on February 20, 2007.  He evaluated  the patient, and after discussion with the patient and family, there has  been a consensus to monitor the patient conservatively, given that she  is a non-optimal surgical candidate due to her morbid obesity, diabetes  and hypertension.  At the morning of discharge, [name] has not had  any bleeding for 2 days now, and her hemoglobin has been stable for the  past several days.  Her discharge hemoglobin is 9.2.  She is without any  symptoms of dizziness, and her blood pressure has actually been  elevated, so she will resume all of her home blood pressure medications.  She will follow up at [facility] on February 24, 2007 at 1:30 p.m. with Dr. Providence Lanius to have her hemoglobin checked.   DISCHARGE MEDICATIONS:  1. Iron sulfate 325 mg p.o. daily.  2. MiraLax 17 grams dissolved in water twice daily.  3. Metformin 500 mg twice daily.  4. Accupril 40 mg daily.  5.  Hydrochlorothiazide 25 mg daily.  6. Diltiazem XL 240 mg daily.  7. Toprol-XL 200 mg daily.     ______________________________  Sylvan Cheese, M.D.    ______________________________  Leighton Roach McDiarmid, M.D.    MJ/MEDQ  D:  02/21/2007  T:  02/21/2007  Job:  [id number]

## 2011-03-19 NOTE — H&P (Signed)
[facility]  Patient:    [name], [name]                         MRN: [id number] Adm. Date:  05/04/00 Attending:  Verlin Grills, M.D. CC:         Lum Babe, M.D.             James L. Randa Evens, M.D.                         History and Physical  CHIEF COMPLAINT:  Lower gastrointestinal bleeding.  HISTORY OF PRESENT ILLNESS:  [name] is a 55 year old female, followed by Dr. Lum Babe.  [name] was admitted to [facility] on  May 01, 2000, by Dr. Llana Aliment. Edwards, to evaluate crampy lower abdominal pain nd hematochezia.  While in the hospital, the patients bleeding stopped spontaneously.  A proctocolonoscopy performed to the cecum by Dr. Randa Evens on May 02, 2000, revealed universal colonic diverticulosis, without active gastrointestinal bleeding.  [name] was discharged from [facility] on the morning of [place] 4, 2001, with a hemoglobin of 9 g, but no further signs of lower gastrointestinal bleeding.  Upon arriving home, she redeveloped crampy lower abdominal pain, lower back pain, and hematochezia.  Re-evaluation in the [facility] Emergency Room reveals a hemoglobin of 10 g, which is unchanged from this mornings value, but an orthostatic drop in her blood pressure when she stands.  [name] is being readmitted to [facility] for further monitoring and treatment of her probable diverticular bleed.  I will prep her colon with GoLYTELY for a possible repeat colonoscopy tomorrow.  If her bleeding does not stop, she may require a colectomy with ileorectal anastomosis.  ALLERGIES:  No known drug allergies.  CURRENT MEDICATIONS: 1. Norvasc 10 mg q.d. 2. Vioxx 25 mg q.d. 3. Prinzide 20/12.5 mg two tablets  q.d.  PAST MEDICAL HISTORY: 1. Hypertension. 2. Hysterectomy. 3. Bilateral total knee replacement surgery.  FAMILY HISTORY:  Mother has  hypertension and diabetes mellitus.  Father died in his early 56s of colon cancer.  SOCIAL HISTORY:  [name] is divorced and lives with her son.  Habits:  [name] consumes alcohol in moderation, and does not smoke cigarettes.  PHYSICAL EXAMINATION:  VITAL SIGNS:  Blood pressure 131/90, pulse 56, respirations 24, temperature 97.9 degrees.  Height 5 feet 6 inches, weight 280 pounds.  HEENT:  Sclerae anicteric.  Conjunctivae normal.  LUNGS:  Clear to auscultation.  CARDIAC:  A regular rhythm without murmurs.  ABDOMEN:  Soft, flat, nontender.  EXTREMITIES:  No edema.  LABORATORY DATA:  On May 01, 2000, revealed a normal prothrombin time and partial thromboplastin time.  Her complete metabolic profile was normal, except for a serum albumin of 3.3.  ASSESSMENT:  Lower gastrointestinal bleeding, rule out diverticular bleed, versus bleeding from vascular ectasia.DD:  05/04/00 TD:  05/04/00 Job: [id number]

## 2011-03-19 NOTE — Procedures (Signed)
[facility]  Patient:    [name], [name]                       MRN: [id number] Proc. Date: 05/06/00 Adm. Date:  [id number] Attending:  Orland Mustard CC:         Angelia Mould. Derrell Lolling, M.D.             Lum Babe, M.D.                           Procedure Report  PROCEDURE:  Esophagogastroduodenoscopy.  INDICATIONS FOR PROCEDURE:  The patient has come in with a GI bleed that is felt to be diverticular. She has had 3 bleeding episodes and is being contemplated for semi-elective/urgent subtotal colectomy. The source of her bleeding is felt to be lower GI but an upper endoscopy is done to rule out an ulcer or some other upper GI source since she had been on Vioxx.  DESCRIPTION OF PROCEDURE:  This had been explained to the patient and consent obtained. This was done in the ICU. She understands that this is likely to be negative but needs to be done prior to any consideration for surgery.  The procedure had been explained to the patient and consent obtained. With the patient in the left lateral decubitus position, the Olympus video endoscope was inserted blindly in the esophagus and advanced under direct vision. The stomach entered, pylorus identified and passed. The duodenum including the bulb and second portion were seen well. No ulceration or inflammation and no signs of recent or active bleeding. Completely normal exam of the duodenal bulb. The antrum and body of the stomach were seen well and were free of ulceration. The fundus and cardia were seen well and in retroflexed view and were normal. The scope was withdrawn. There was a hiatal hernia with a patent GE junction and somewhat reddened esophagus. No ulcerations and no sign of active bleeding. The proximal esophagus was normal. The scope was withdrawn. The patient tolerated the procedure well.  ASSESSMENT: Hiatal hernia with esophagitis. I doubt that this has any effect whatsoever on her  bleeding.  PLAN:  Will follow the patient clinically. I think if she bleeds again she will certainly need a subtotal colectomy. DD:  05/06/00 TD:  05/06/00 Job: [id number]

## 2011-03-19 NOTE — H&P (Signed)
[facility]  Patient:    [name], [name] Visit Number: [id number] MRN: [id number]          Service Type: MED Location: [phone] Attending Physician:  Mingo Amber Dictated by:   Roosvelt Harps, M.D. Admit Date:  04/17/2002   CC:         [facility]  James L. Randa Evens, M.D.   History and Physical  HISTORY OF PRESENT ILLNESS:  Ms. Elem is a 55 year old female who presented to the emergency room reporting seven episodes of hematochezia over the last 48 hours.  She denies dysphagia, epigastric, or lower abdominal pain, hematemesis, melena, or history of ulcer disease.  She had taken 400 mg of ibuprofen on Friday and again on Sunday for knee pain, but she has used no other nonsteroidals.  She has a similar history of fairly massive recurrent bleeding in July of 2001 and underwent two colonoscopies that demonstrated pandiverticulosis without bleeding.  She had multiple other studies including a normal enteroclysis, a negative Meckels scan, a negative nuclear bleeding scan, and an upper endoscopy, the latter of which showed small hiatal hernia with esophagitis.  The patient denies any symptomatic heartburn at this time.  PAST MEDICAL HISTORY: 1. 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 2. She also has morbid obesity. 3. Hypertension.  CURRENT MEDICATIONS: 1. According to her old record she was taking Norvasc. 2. Prinivil. 3. Hydrochlorothiazide.  She says she is still on these medicines, but is    uncertain.  ALLERGIES:  None reported.  FAMILY HISTORY:  Notable in that her father died of colon cancer in his 50s. There is no other family history of inflammatory bowel disease or other GI problem.  SOCIAL HISTORY:  She is divorced, nonsmoker, nondrinker.  REVIEW OF SYSTEMS:  GENERAL:  A 20 pound weight loss through intentional dieting.  No fevers or chills.  SKIN:  No rash or pruritus.  ENDOCRINE:  No history of  diabetes or thyroid problems.  EYES:  No icterus or change in vision.  ENT:  No aphthous ulcers or chronic sore throat.  RESPIRATORY:  No shortness of breath, cough, or wheezing.  CARDIAC:  No chest pain, palpitations, or history of valvular heart disease.  GASTROINTESTINAL:  As above.  GENITOURINARY:  No dysuria or hematuria.  The remainder of the review of systems is negative.  PHYSICAL EXAMINATION:  GENERAL:  She is a morbidly obese adult female in no acute distress.  VITAL SIGNS:  Afebrile.  Blood pressure 132/82, pulse 105.  SKIN:  Normal.  HEENT:  Eyes are anicteric.  Conjunctivae are somewhat pale.  Oropharynx unremarkable.  CHEST:  Clear.  HEART:  Heart sounds regular rate and rhythm.  ABDOMEN:  Obese with normal bowel sounds and without mass, tenderness, or organomegaly.  There is no rebound or hernia.  RECTAL:  Exam reveals maroon stool.  EXTREMITIES:  Obese with trace edema and bilateral scars overlying the knees. Dorsalis pedis pulses 2+ bilaterally.  LABORATORY TESTS:  Reveal a hemoglobin of 8.5 with a Gesell count of 5.9. Platelet count is normal at 217.  Prothrombin time is normal at 14.1, INR 1.1. Electrolytes reveal a potassium of 2.9, but are otherwise normal.  Blood sugar 156, BUN 13, creatinine 0.7, albumin 2.9.  Liver function tests normal.  IMPRESSION:  Morbidly obese, reportedly hypertensive female with a recurrent lower gastrointestinal bleed which is likely diverticular.  PLAN:  She will be observed and transfused as needed pending serial hemoglobins.  She will be  kept on clear liquids and prepped for repeat colonoscopy.  If she continues to bleed, surgical consultation will be obtained.  This appears to be her second major diverticular bleed and subtotal colectomy is going to be probably recommended at some point.  Please see the orders. Dictated by:   Roosvelt Harps, M.D. Attending Physician:  Mingo Amber DD:  04/17/02 TD:   04/18/02 Job: [id number]

## 2011-03-19 NOTE — Procedures (Signed)
[facility]  Patient:    [name], [name]                       MRN: [id number] Proc. Date: 05/05/00 Adm. Date:  [id number] Attending:  Orland Mustard CC:         Lum Babe, M.D.                           Procedure Report  PROCEDURE:  Colonoscopy.  MEDICATIONS:  Fentanyl 75 mcg, Versed 10 mg IV.  INDICATIONS FOR PROCEDURE:  A nice 55 year old woman was hospitalized earlier this week with acute lower intestinal bleeding.  She had a colonoscopy that showed pandiverticulosis with some clots and blood to the colon but no signs of active bleeding.  She was watched for two days following the colonoscopy and her hemoglobin was basically stable with no gross clinical bleeding and was sent home yesterday.  She was at home for several hours and started feeling weak and dizzy and passing bright red blood again.  She was orthostatic in the emergency room and was readmitted.  She notes with prep her stool again has cleared but she was transfused two units of packed cells. This procedure is done again to try to identify a source of bleeding.  DESCRIPTION OF PROCEDURE:  The procedure was explained to the patient and consent obtained.  Patient was placed in the left lateral decubitus position and the adult Olympus video colonoscope was inserted and advanced under direct visualization.  The prep was excellent.  She had pandiverticulosis from the sigmoid all the way to the cecum as seen on previous colonoscopy but no coffee ground material, no active bleeding.  In fact, her colon was completely free of any signs of recent or active bleeding all the way to the cecum.  There was solid stool in the cecum.  I was unable to find the ileocecal valve due to the solid stool. I entered the ileocecal valve during her previous colonoscopy earlier this week and for 10 cm into the terminal ileum without any signs of bleeding.  The scope was withdrawn.  Noted solid stool in the  cecum. Again pandiverticulosis throughout the entire colon with no signs of any recent of active bleeding, no clots, etcetera.  The scope was withdrawn, the patient tolerated the procedure well and was maintained on low flow oxygen and pulse oximetry throughout the procedure with no obvious problems.  ASSESSMENT:  Pan diverticulosis with no active bleeding.  PLAN: 1. Will keep her on clear liquids, MiraLax and follow her clinically.  If    she rebleeds, she will need a bleeding scan and probably a subtotal    colectomy. 2. Will put her on out call back list for a colonoscopy in five years due to    her family history of colon cancer. DD:  05/05/00 TD:  05/05/00 Job: [id number]

## 2011-03-19 NOTE — Discharge Summary (Signed)
NAMEJULIE-ANNE, TORAIN NO.:  [id number]   MEDICAL RECORD NO.:  [id number]          PATIENT TYPE:  INP   LOCATION:  4732                         FACILITY:  [facility]   PHYSICIAN:  Leighton Roach McDiarmid, M.D.DATE OF BIRTH:  1956/08/11   DATE OF ADMISSION:  03/02/2006  DATE OF DISCHARGE:  03/05/2006                                 DISCHARGE SUMMARY   DISCHARGE DIAGNOSIS:  1.  Diverticulosis.  2.  Bright red blood per rectum.  3.  Hypertension.  4.  Obesity.  5.  Diabetes mellitus.   DISCHARGE MEDICATIONS:  1.  Lisinopril 40 mg daily.  2.  Diltiazem 240 mg daily.  3.  Hydrochlorothiazide 25 mg daily.  4.  Metoprolol 200 mg daily.  5.  Metformin 500 mg b.i.d.  6.  MiraLax 17 g daily.  7.  Mylanta Gas 80 mg q.i.d.   FOLLOW-UP APPOINTMENTS:  1.  Ludwig Clarks, FMTS, telephone number [phone].  Patient instructed to call      for appointment, as discharge was on a weekend.  2.  Dr. Arlyce Dice, GI, telephone number [phone]. Patient instructed to call      for appointment, as discharge was on weekends.   HOSPITAL COURSE:  1.  Please see dictated H&P for more detail.  The patient is a 55 year old      lady with history of diverticular bleeds in 2001 who presented to the      family practice clinic with a history of bright red blood per rectum.      Admission hemoglobin was 9.6.  The patient was admitted and transfused 1      unit of packed red cells prior to colonoscopy by GI.  Colonoscopy      results were diffuse diverticulosis but was notable for no active      bleeding.  Recommendation from GI was to continue to watch patient,      follow serial CBCs, and if patient had recurrent bleeding event, then      recommendation was for seven total colectomy.  The patient did have mild      bleeding following colonoscopy, but markedly reduced compared to initial      complaint.  Bleeding subsided within a day after colonoscopy.      Hemoglobin was stable, with a discharge hemoglobin of  9.1.  The patient      was discharged home with close followup at the family practice clinic,      as noted above.  2.  Hypertension.  The patient's blood pressure was stable throughout      hospital stay.  3.  Diabetes. CBGs were stable throughout hospitalization.  The patient was      maintained on home regimen, as noted above.   DISCHARGE INSTRUCTIONS:  The patient was instructed to return to the clinic  or the ED if bleeding resumed.  The patient was scheduled for close followup  with both GI and the family practice clinic, with conservative management as  the plan pending rebleed.  The patient was discharged with carb-modified  diabetic dietary restrictions, and no activity restrictions.  Towana Badger, M.D.    ______________________________  Leighton Roach McDiarmid, M.D.    JP/MEDQ  D:  03/29/2006  T:  03/30/2006  Job:  [id number]

## 2011-03-19 NOTE — Discharge Summary (Signed)
[facility]  Patient:    [name], [name]                       MRN: [id number] Adm. Date:  [id number] Disc. Date: [id number] Attending:  Orland Mustard                           Discharge Summary  She had gone home and had begun to have painless hematochezia.  She was admitted to the medical floor and was cleaned out with NuLytely.  The colonoscopy was repeated, showing no active bleeding.  The patient went back to the floor and upon arrival on the floor, began to pass profuse blood stools; this was approximately 30 minutes to an hour after colonoscopy had been completely clear other than diverticular disease and had had no active bleeding whatsoever at the time of colonoscopy.  At the patients request, she was seen by Dr. Judie Petit T. Pleas Koch. for a second opinion, who agreed with the workup and agreed with a nuclear tag scan.  He had no other specific recommendations but the nuclear tag scan showed no evidence of active bleeding.  Patient subsequently had a Meckels scan several days later and this was again free of any evidence of a Meckels diverticulum.  She stabilized and had no further bleeding.  It was felt that she probably had bleeding from diverticula.  With the Meckels scan being negative and no gross bleeding, it was felt that she had stopped.  She did receive a total of four units of packed cells.  At the time of her discharge, her hemoglobin, which had dropped to 7.2, had come up to 8.8 and on the morning of ______  was 8.2 but with heme-negative stools.  We had a long talk with the patient concerning the diverticular bleeding and the options presented to her were going ahead with a subtotal colectomy now or very aggressive therapy for diverticular disease, with the hopes that this would not recur.  The pros and cons of each approach were discussed with the patient and she and I, in conjunction, decided to go ahead and treat her with Miralax  to keep her stools soft, with iron to bring up her blood count and to see how she would do with this.  She understands that she is not to leave the [facility] area for at least the next two weeks so that she will be close by in case she has a rebleed and that if her rebleed occurs, that she will very likely go on to have a subtotal colectomy.  She is also instructed to use no aspirin, Advil or any kind of arthritis medicine.  DISPOSITION:  The patient is discharged home with no active bleeding.  DISCHARGE MEDICATIONS: 1. Norvasc 10 mg daily. 2. Prinivil 20 mg daily. 3. Hydrochlorothiazide 12.5 mg daily as a combination therapy with Prinzide. 4. She also takes Slow-Fe twice daily. 5. Miralax one glass twice daily.  FOLLOWUP:  She will see Dr. Randa Evens in the office on August 13th at 8:30 a.m. On Thursday, July 12th, she will come by at approximately 8:30 for a CBC; depending on the results of that CBC, further labs may be dictated.  For any signs of active bleeding, she will call right away. DD:  05/18/00 TD:  05/20/00 Job: [id number]

## 2011-04-01 NOTE — Assessment & Plan Note (Signed)
Nocturnal leg  Cramps. Most likely idiopathic but will check CMET and CK to rule out hypocalcemia and myositis.  Providing exercises since movement helps.

## 2011-04-01 NOTE — Assessment & Plan Note (Signed)
?   Vitiligo vs. Tinea versicolor.  Vitiligo more likely given distal location. Plan to try a course of steroid cream.

## 2011-04-01 NOTE — Progress Notes (Signed)
  Subjective:    Patient ID: [name], female    DOB: [birth year], 55 y.o.   MRN: [id number]  HPI Here to discuss the following three issues 1. R wrist hypopigmentation-  For 5 weeks. Slightly pruritic. No lights post anywhere else on her body. No family history of vitiligo. Not using anything on the area. No recent injury to the area.  2. Left lower leg cramps at night- pain 6-7/10. Better when walking around.  3. Low BPs at night 89-90/60-70- no falls or syncopal episodes. Pt will hold PM dose of hydralazine and this helps.    Review of Systems See HPI    Objective:   Physical Exam  Constitutional: She appears well-developed.  Cardiovascular: Normal rate, regular rhythm and normal heart sounds.   Pulmonary/Chest: Effort normal and breath sounds normal.  Musculoskeletal:       Right lower leg: She exhibits no tenderness, no bony tenderness, no swelling and no edema.       Left lower leg: She exhibits no tenderness, no bony tenderness and no swelling.  Skin:       R wrist 3x3 cm hypopigmented macule.           Assessment & Plan:

## 2011-04-02 ENCOUNTER — Encounter: Payer: Self-pay | Admitting: Family Medicine

## 2011-04-02 ENCOUNTER — Ambulatory Visit (INDEPENDENT_AMBULATORY_CARE_PROVIDER_SITE_OTHER): Payer: Medicare PPO | Admitting: Family Medicine

## 2011-04-02 VITALS — BP 126/84 | HR 90 | Temp 99.1°F | Ht 64.75 in | Wt 295.7 lb

## 2011-04-02 VITALS — BP 126/85

## 2011-04-02 DIAGNOSIS — M722 Plantar fascial fibromatosis: Secondary | ICD-10-CM

## 2011-04-02 DIAGNOSIS — L819 Disorder of pigmentation, unspecified: Secondary | ICD-10-CM

## 2011-04-02 DIAGNOSIS — K5731 Diverticulosis of large intestine without perforation or abscess with bleeding: Secondary | ICD-10-CM | POA: Insufficient documentation

## 2011-04-02 DIAGNOSIS — I10 Essential (primary) hypertension: Secondary | ICD-10-CM

## 2011-04-02 DIAGNOSIS — E1169 Type 2 diabetes mellitus with other specified complication: Secondary | ICD-10-CM

## 2011-04-02 DIAGNOSIS — R0789 Other chest pain: Secondary | ICD-10-CM

## 2011-04-02 DIAGNOSIS — R252 Cramp and spasm: Secondary | ICD-10-CM

## 2011-04-02 LAB — POCT GLYCOSYLATED HEMOGLOBIN (HGB A1C): Hemoglobin A1C: 5.9

## 2011-04-02 NOTE — Assessment & Plan Note (Signed)
Resolved. Since pt started KCL per her cardiologist.  Plan to continue KCL.  Will need to monitor K+ since pt is taking spironolactone.

## 2011-04-02 NOTE — Progress Notes (Signed)
  Subjective:    Patient ID: [name], female    DOB: 1956-06-29, 55 y.o.   MRN: [id number]  HPI Pt here for f/u to discuss leg cramps, wrist hypopigmentation, and BPs.   1. Leg cramps- improved since taking a medication prescribed by her cardiologist. She had leg pain during an exercise stress test. Since taking thsi new medication she is no longer having leg pains.  2. R wrist hypopigmentation- improved. Pt is using the cutivate.  3. BPs- stable. Pt states that her BP has been stable at home. No more lows. SBP 120s DBP 75-90. Pt has not had syncope.    Review of Systems  Constitutional: Negative.   Respiratory: Negative.   Cardiovascular: Positive for leg swelling.       Objective:   Physical Exam  Constitutional: She appears well-developed and well-nourished.  Cardiovascular: Normal rate, regular rhythm and normal heart sounds.   Pulmonary/Chest: Effort normal and breath sounds normal. No respiratory distress. She has no wheezes. She has no rales.  Musculoskeletal: Normal range of motion. She exhibits no edema and no tenderness.  Skin:             Assessment & Plan:

## 2011-04-02 NOTE — Assessment & Plan Note (Signed)
Improving. Pt using fluticasone propinate cream with improvement. This favors vitiligo.  Denies itching or irritation in the area. Plan to continue use of cream.

## 2011-04-02 NOTE — Assessment & Plan Note (Signed)
Well controlled. No extreme lows.  Continuing current management.

## 2011-04-02 NOTE — Assessment & Plan Note (Signed)
None recently. Pt has not has to take nitroglycerin. Followed by cardiology. Normal stress test last month.

## 2011-04-02 NOTE — Progress Notes (Signed)
  Subjective:    Patient ID: [name], female    DOB: 01/21/1956, 55 y.o.   MRN: [id number]  HPI 55 year old female here to followup on her right plantar fasciitis. Dr. Lloyd Huger injected her about 6-7 weeks ago, and she states this provided good pain relief up until 1-1/2 weeks ago. She is now had return of the pain at the origin of her plantar fascia on the medial plantar aspect of the calcaneus. It is worse in the morning and with prolonged standing and walking.   Review of Systems Denies fever, sweats, chills, weight loss    Objective:   Physical Exam General appearance: Obese female in no distress Right foot: Positive tenderness at the origin of the plantar fascia of the medial plantar aspect of the calcaneus. Positive pain with passive dorsiflexion. Bilateral feet: Show pes planus with overpronation       Assessment & Plan:  Recurrent right plantar fasciitis -Discussed with her that was a little soon to repeat steroid injection - we offered prednisone dose pack, but she declined this as well other meds at this time - we gave her green temporary orthotics with 5/16 inch heel lifts and bilateral red scaphoid pads to help with her pronation control. She tried these prior to leaving and felt good - she'll followup with [place] in 3-4 weeks, at that time we would consider custom orthotics

## 2011-04-14 ENCOUNTER — Telehealth: Payer: Self-pay | Admitting: General Surgery

## 2011-04-14 NOTE — Telephone Encounter (Signed)
Would like to speak with someone about the referral for a colonoscopy.  She said when she called to schedule the appointment, they were asking when she last had this test done and she wants to speak with someone about that.

## 2011-04-14 NOTE — Telephone Encounter (Signed)
Spoke with patient and informed her that I spoke with Adult nurse and [employer] but they wont see her since she had last colonoscopy with [facility], called [facility] and they said she would have to contact billing dept before appt could be scheduled, gave patient this information, she will contact billing dept.

## 2011-04-15 NOTE — Telephone Encounter (Addendum)
Pt is asking to speak to nurse - she wants to tells her what [place] billing said.

## 2011-04-20 NOTE — Telephone Encounter (Signed)
Tried calling, no answer, no vm.

## 2011-04-23 ENCOUNTER — Ambulatory Visit (INDEPENDENT_AMBULATORY_CARE_PROVIDER_SITE_OTHER): Payer: Medicare PPO | Admitting: Family Medicine

## 2011-04-23 ENCOUNTER — Encounter: Payer: Self-pay | Admitting: Family Medicine

## 2011-04-23 VITALS — BP 147/96

## 2011-04-23 DIAGNOSIS — E1169 Type 2 diabetes mellitus with other specified complication: Secondary | ICD-10-CM

## 2011-04-23 DIAGNOSIS — M722 Plantar fascial fibromatosis: Secondary | ICD-10-CM

## 2011-04-23 NOTE — Progress Notes (Signed)
  Subjective:    Patient ID: [name], female    DOB: 09/01/1956, 55 y.o.   MRN: [id number]  HPI  Here for followup of bilateral foot pain. The temporary orthotics seem to help some. She is here for custom molded orthotics.  Review of Systems Denies any recent change in weight.    Objective:   Physical Exam    GENERAL: Well-developed female no acute distress FEET: Bilateral pes planus and bilateral also transverse arch. Bilateral first ray bunion formation with migration of the ray medially and deformity of the first toe. Significant callus formation on the heel bilaterally   Patient was fitted for a : standard, cushioned, semi-rigid orthotic. The orthotic was heated, placed on the orthotic stand. The patient was positioned in subtalar neutral position and 10 degrees of ankle dorsiflexion in a weight bearing stance on the heated orthotic blank After completion of molding, a stable base was applied to the orthotic blank. The blank was ground to a stable position for weight bearing. Blank: blue swirl Base:blue foam Posting:medail heel  Size: 10    Assessment & Plan:

## 2011-06-21 ENCOUNTER — Encounter: Payer: Self-pay | Admitting: Family Medicine

## 2011-06-21 ENCOUNTER — Ambulatory Visit (INDEPENDENT_AMBULATORY_CARE_PROVIDER_SITE_OTHER): Payer: Medicare PPO | Admitting: Family Medicine

## 2011-06-21 ENCOUNTER — Ambulatory Visit (HOSPITAL_COMMUNITY)
Admission: RE | Admit: 2011-06-21 | Discharge: 2011-06-21 | Disposition: A | Discharge status: Home / Self Care | Payer: Medicare PPO | Source: Ambulatory Visit | Attending: Family Medicine | Admitting: Family Medicine

## 2011-06-21 VITALS — BP 98/65 | HR 86 | Temp 97.9°F | Wt 305.0 lb

## 2011-06-21 DIAGNOSIS — M545 Low back pain, unspecified: Secondary | ICD-10-CM

## 2011-06-21 DIAGNOSIS — Z96659 Presence of unspecified artificial knee joint: Secondary | ICD-10-CM | POA: Insufficient documentation

## 2011-06-21 DIAGNOSIS — M25561 Pain in right knee: Secondary | ICD-10-CM | POA: Insufficient documentation

## 2011-06-21 DIAGNOSIS — M25569 Pain in unspecified knee: Secondary | ICD-10-CM

## 2011-06-21 DIAGNOSIS — I10 Essential (primary) hypertension: Secondary | ICD-10-CM

## 2011-06-21 MED ORDER — ZOLPIDEM TARTRATE 10 MG PO TABS: 10.0000 mg | ORAL_TABLET | Freq: Every evening | ORAL | Status: DC | PRN

## 2011-06-21 NOTE — Patient Instructions (Signed)
[name],  Thank you for coming in today. Please increase your intake of tylenol, get the x-ray and increase your physical activity to help with your knee pain. I will review your x-ray and have you come back for an injection if indicated.  -Dr. Armen Pickup

## 2011-06-25 ENCOUNTER — Telehealth: Payer: Self-pay | Admitting: Family Medicine

## 2011-06-25 MED ORDER — ACETAMINOPHEN ER 650 MG PO TBCR: 650.0000 mg | EXTENDED_RELEASE_TABLET | Freq: Two times a day (BID) | ORAL | Status: DC | PRN

## 2011-06-25 MED ORDER — CLONIDINE HCL 0.2 MG PO TABS: 0.2000 mg | ORAL_TABLET | Freq: Two times a day (BID) | ORAL | Status: DC

## 2011-06-25 MED ORDER — CARVEDILOL 25 MG PO TABS
25.0000 mg | ORAL_TABLET | Freq: Two times a day (BID) | ORAL | Status: DC
Start: 2011-06-25 — End: 2011-07-16

## 2011-06-25 MED ORDER — POLYETHYLENE GLYCOL 3350 17 G PO PACK: 17.0000 g | PACK | Freq: Two times a day (BID) | ORAL | Status: DC

## 2011-06-25 MED ORDER — ESOMEPRAZOLE MAGNESIUM 40 MG PO CPDR: 40.0000 mg | DELAYED_RELEASE_CAPSULE | Freq: Every day | ORAL | Status: DC

## 2011-06-25 MED ORDER — DOCUSATE SODIUM 100 MG PO CAPS: 100.0000 mg | ORAL_CAPSULE | Freq: Two times a day (BID) | ORAL | Status: DC | PRN

## 2011-06-25 MED ORDER — METFORMIN HCL 500 MG PO TABS: 500.0000 mg | ORAL_TABLET | Freq: Every day | ORAL | Status: DC

## 2011-06-25 MED ORDER — CALCIUM CARBONATE-VITAMIN D 250-125 MG-UNIT PO TABS: 1.0000 | ORAL_TABLET | Freq: Every day | ORAL | Status: DC

## 2011-06-25 MED ORDER — B COMPLEX PO TABS: 1.0000 | ORAL_TABLET | Freq: Every day | ORAL | Status: DC

## 2011-06-25 NOTE — Telephone Encounter (Signed)
Message copied by Dessa Phi on Fri Jun 25, 2011  1:54 PM ------      Message from: [place], SARAH T      Created: Thu Jun 24, 2011  6:01 PM       This appeared in my inbox.  Wanted to be sure you saw as well.      SJ      ----- Message -----         From: Rad Results In Interface         Sent: 06/21/2011   1:40 PM           To: Sarah T. [place]

## 2011-06-25 NOTE — Assessment & Plan Note (Addendum)
Imp: L knee osteoarthritis.  Plan: send for x-ray to evaluate prothesis. If no acute abnormality on x-ray will offer cortisone injection. In the meantime, increase tylenol dose along with rest, ice and elevation. Recommend short rest then return to physical activity. Pool exercise would be best to achieve weight loss with low impact.

## 2011-06-25 NOTE — Progress Notes (Signed)
  Subjective:    Patient ID: [name], female    DOB: [birth year], 55 y.o.   MRN: [id number]  HPI Mrs. Dougan is here for f/u and to discuss the following: 1. Leg pain: She states that she has pain in her knees and b/l shin. She has chronic knee pain L>R. She has a history of L knee replacement. She denies recent injury or increase in activity level. She states that her level of activity has actually decreased. She She is taking tylenol for the pain. The pain is achy and occasionally sharp. The pain is worse with ambulation.   2. Back pain: the pain is located in the low back.The pain is non-radiating. The pain came on gradually. It is bad a day and ok at night. The pain is 5/10. She denies loss of bowel or bladder function. She denies weight loss, fever and chills.   3. Medication refill: taking all medication as prescribed and needs everything refilled.    Review of Systems As per HPI     Objective:   Physical Exam  Constitutional: She appears well-developed and well-nourished. No distress.       Obese and weight has increased from last visit.   Cardiovascular: Normal rate, regular rhythm and normal heart sounds.   Pulmonary/Chest: Effort normal and breath sounds normal.  Musculoskeletal: Normal range of motion. She exhibits no edema and no tenderness.       Left knee: Normal.       Lumbar back: Normal.       L knee with noticeable surgical scar. No effusion. Full ROM. Shin w.o bruising or laceration           Assessment & Plan:

## 2011-06-25 NOTE — Assessment & Plan Note (Addendum)
Imp: worse s/p PT. Pt has also gained weight. She understands that increase weight will worsen back pain.  Plan: Pt will return to a regular exercise regimen to improve strength and flexibility.

## 2011-06-25 NOTE — Telephone Encounter (Signed)
Called patient about R knee x-ray. Asked pt to come in neck week for knee injection if pain is still bad.

## 2011-07-02 ENCOUNTER — Encounter: Payer: Self-pay | Admitting: Family Medicine

## 2011-07-02 ENCOUNTER — Observation Stay (HOSPITAL_COMMUNITY)
Admission: EM | Admit: 2011-07-02 | Discharge: 2011-07-04 | Disposition: A | Discharge status: Home / Self Care | Payer: Medicare PPO | Source: Ambulatory Visit | Attending: Family Medicine | Admitting: Family Medicine

## 2011-07-02 DIAGNOSIS — I10 Essential (primary) hypertension: Secondary | ICD-10-CM | POA: Insufficient documentation

## 2011-07-02 DIAGNOSIS — Z8673 Personal history of transient ischemic attack (TIA), and cerebral infarction without residual deficits: Secondary | ICD-10-CM | POA: Insufficient documentation

## 2011-07-02 DIAGNOSIS — D509 Iron deficiency anemia, unspecified: Secondary | ICD-10-CM | POA: Insufficient documentation

## 2011-07-02 DIAGNOSIS — I872 Venous insufficiency (chronic) (peripheral): Secondary | ICD-10-CM | POA: Insufficient documentation

## 2011-07-02 DIAGNOSIS — K921 Melena: Principal | ICD-10-CM | POA: Insufficient documentation

## 2011-07-02 DIAGNOSIS — M654 Radial styloid tenosynovitis [de Quervain]: Secondary | ICD-10-CM | POA: Insufficient documentation

## 2011-07-02 DIAGNOSIS — G4700 Insomnia, unspecified: Secondary | ICD-10-CM | POA: Insufficient documentation

## 2011-07-02 DIAGNOSIS — R1013 Epigastric pain: Secondary | ICD-10-CM | POA: Insufficient documentation

## 2011-07-02 DIAGNOSIS — E119 Type 2 diabetes mellitus without complications: Secondary | ICD-10-CM | POA: Insufficient documentation

## 2011-07-02 LAB — DIFFERENTIAL
Basophils Absolute: 0 10*3/uL (ref 0.0–0.1)
Basophils Relative: 0 % (ref 0–1)
Eosinophils Absolute: 0.4 10*3/uL (ref 0.0–0.7)
Eosinophils Relative: 5 % (ref 0–5)
Lymphocytes Relative: 28 % (ref 12–46)
Lymphs Abs: 2.3 10*3/uL (ref 0.7–4.0)
Monocytes Absolute: 0.5 10*3/uL (ref 0.1–1.0)
Monocytes Relative: 7 % (ref 3–12)
Neutro Abs: 4.9 10*3/uL (ref 1.7–7.7)
Neutrophils Relative %: 60 % (ref 43–77)

## 2011-07-02 LAB — BASIC METABOLIC PANEL
BUN: 20 mg/dL (ref 6–23)
CO2: 22 mEq/L (ref 19–32)
Calcium: 9 mg/dL (ref 8.4–10.5)
Chloride: 108 mEq/L (ref 96–112)
Creatinine, Ser: 0.72 mg/dL (ref 0.50–1.10)
GFR calc Af Amer: >60 mL/min (ref >60)
GFR calc non Af Amer: >60 mL/min (ref >60)
Glucose, Bld: 135 mg/dL — ABNORMAL HIGH (ref 70–99)
Potassium: 3.8 mEq/L (ref 3.5–5.1)
Sodium: 139 mEq/L (ref 135–145)

## 2011-07-02 LAB — CARDIAC PANEL(CRET KIN+CKTOT+MB+TROPI)
CK, MB: 2 ng/mL (ref 0.3–4.0)
Relative Index: INVALID (ref 0.0–2.5)
Total CK: 95 U/L (ref 7–177)
Troponin I: <0.3 ng/mL (ref <0.30)

## 2011-07-02 LAB — PROTIME-INR
INR: 1.08 (ref 0.00–1.49)
Prothrombin Time: 14.2 seconds (ref 11.6–15.2)

## 2011-07-02 LAB — TROPONIN I: Troponin I: <0.3 ng/mL (ref <0.30)

## 2011-07-02 LAB — CBC
HCT: 32.4 % — ABNORMAL LOW (ref 36.0–46.0)
HCT: 26.4 % — ABNORMAL LOW (ref 36.0–46.0)
Hemoglobin: 10.5 g/dL — ABNORMAL LOW (ref 12.0–15.0)
Hemoglobin: 8.8 g/dL — ABNORMAL LOW (ref 12.0–15.0)
MCH: 29.1 pg (ref 26.0–34.0)
MCH: 29.6 pg (ref 26.0–34.0)
MCHC: 32.4 g/dL (ref 30.0–36.0)
MCHC: 33.3 g/dL (ref 30.0–36.0)
MCV: 89.8 fL (ref 78.0–100.0)
MCV: 88.9 fL (ref 78.0–100.0)
Platelets: 263 10*3/uL (ref 150–400)
Platelets: 227 10*3/uL (ref 150–400)
RBC: 3.61 MIL/uL — ABNORMAL LOW (ref 3.87–5.11)
RBC: 2.97 MIL/uL — ABNORMAL LOW (ref 3.87–5.11)
RDW: 14.5 % (ref 11.5–15.5)
RDW: 14.5 % (ref 11.5–15.5)
WBC: 8.1 10*3/uL (ref 4.0–10.5)
WBC: 7.1 10*3/uL (ref 4.0–10.5)

## 2011-07-02 LAB — GLUCOSE, CAPILLARY: Glucose-Capillary: 98 mg/dL (ref 70–99)

## 2011-07-02 LAB — CK TOTAL AND CKMB (NOT AT ARMC)
CK, MB: 2 ng/mL (ref 0.3–4.0)
Relative Index: 1.7 (ref 0.0–2.5)
Total CK: 115 U/L (ref 7–177)

## 2011-07-02 LAB — APTT: aPTT: 31 seconds (ref 24–37)

## 2011-07-02 NOTE — H&P (Signed)
Family Medicine Teaching [facility] Admission History and Physical  [name] Medical record number: [id number] Date of birth: 1956-04-26 Age: 55 y.o. Gender: female  Primary Care Provider: Dessa Phi, MD  Chief Complaint: lower GI bleed History of Present Illness: [name] is a 55 y.o. year old female with PMH significant for known diverticulosis and several hospitalizations for the same presenting with 1 day history of LGIB.  Patient states she last had normal BM yesterday AM.  Starting about 11 PM last night, she had urge for bowel movement and began experiencing bright red blood per rectum.  She quantified this as "several cups of blood" in the toilet bowl.  This continued roughly every 30 minutes, with 1-2 cups blood per bowel movement according to patient.  Around 3 AM today she began feeling progressively weak and somewhat short of breath, and therefore she came in to be admitted.  In ED, blood pressure good, but patient tachycardic.  Hgb has dropped 2 points, from 12 to 10, since last measurement in 2011.  Patient denies any further bleeding since coming to ED, but states she has urge for bowel movement along with some abdominal pain.  She thinks she may have had some chest pain and did complain of shortness of breath at rest, but she denies any nausea, vomiting.  No recent illnesses.  No numbness or weakness noted.    Patient Active Problem List  Diagnoses  . 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 OBESITY, NOS  . ANEMIA, IRON DEFICIENCY, UNSPEC.  Marland Kitchen HTN controlled.   . VENOUS INSUFFICIENCY, CHRONIC  . ALLERGIC RHINITIS CAUSE UNSPECIFIED  . DIVERTICULOSIS, COLON  . CYST, KIDNEY, ACQUIRED  . FOOT PAIN, BILATERAL  . DIZZINESS  . CHEST PAIN, ATYPICAL  . LIBIDO, DECREASED  . CEREBROVASCULAR ACCIDENT, HX OF  . GASTROINTESTINAL HEMORRHAGE, HX OF  . POSTMENOPAUSAL STATUS  . LOW BACK PAIN, ACUTE  . DE QUERVAIN'S TENOSYNOVITIS  . Localized rash    . Plantar fasciitis  . Hypopigmented skin lesion  . Muscle cramps at night  . Diverticulosis of colon with hemorrhage  . Left knee pain   Past Medical History: Past Medical History  Diagnosis Date  . Anemia   . Hypertension   . Diverticulosis of colon with hemorrhage 2001    4  previous hospital admissions for bleeding  . Stroke     Past Surgical History: No past surgical history on file.  Social History: History   Social History  . Marital Status: Divorced    Spouse Name: N/A    Number of Children: N/A  . Years of Education: N/A   Social History Main Topics  . Smoking status: Never Smoker   . Smokeless tobacco: Never Used  . Alcohol Use: Not on file  . Drug Use: Not on file  . Sexually Active: Not on file   Other Topics Concern  . Not on file   Social History Narrative  . No narrative on file    Family History: Family History  Problem Relation Age of Onset  . Cancer Father 58    Died from complications of colon CA    Allergies: Allergies  Allergen Reactions  . Aspirin     REACTION: GI BLEED !!! AVOID NSAID'S  . Nsaids     REACTION: GI bleeds    Current Outpatient Prescriptions  Medication Sig Dispense Refill  . acetaminophen (TYLENOL) 650 MG CR tablet Take 1 tablet (650 mg total) by mouth  2 (two) times daily as needed. As needed for pain. May take 3 times a day if needed  90 tablet  2  . b complex vitamins tablet Take 1 tablet by mouth daily.  90 tablet  2  . calcium-vitamin D (OSCAL WITH D) 250-125 MG-UNIT per tablet Take 1 tablet by mouth daily.  90 tablet  2  . carvedilol (COREG) 25 MG tablet Take 1 tablet (25 mg total) by mouth 2 (two) times daily with a meal.  120 tablet  3  . cloNIDine (CATAPRES) 0.2 MG tablet Take 1 tablet (0.2 mg total) by mouth 2 (two) times daily.  180 tablet  3  . diltiazem (CARDIZEM) 30 MG tablet Take 1 tablet (30 mg total) by mouth 2 (two) times daily. Per cards   120 tablet  3  . docusate sodium (COLACE) 100 MG  capsule Take 1 capsule (100 mg total) by mouth 2 (two) times daily as needed for constipation.  30 capsule  2  . esomeprazole (NEXIUM) 40 MG capsule Take 1 capsule (40 mg total) by mouth daily before breakfast.  90 capsule  2  . ferrous sulfate 324 MG TBEC Take 324 mg by mouth 3 (three) times daily with meals.        . fexofenadine (ALLEGRA) 180 MG tablet Take 180 mg by mouth daily.        . fluticasone (CUTIVATE) 0.05 % cream Apply topically 2 (two) times daily.  30 g  0  . fluticasone (FLONASE) 50 MCG/ACT nasal spray 2 sprays in each nostril in am after nasal rinse      . furosemide (LASIX) 20 MG tablet Take 1 tablet (20 mg total) by mouth daily as needed. For swelling   60 tablet  3  . gabapentin (NEURONTIN) 100 MG capsule 1 dose over 46 hours. 1 tab 3 x a day for 3 days, the 2 tabs 3 x a day for 3 days, then 3 tabs 3 x a day as needed for back pain. Currently not taking.      . hydrALAZINE (APRESOLINE) 25 MG tablet Take 25 mg by mouth daily.        . isosorbide mononitrate (IMDUR) 60 MG 24 hr tablet Take 60 mg by mouth daily. Per cards        . metFORMIN (GLUCOPHAGE) 500 MG tablet Take 1 tablet (500 mg total) by mouth daily with breakfast.  90 tablet  2  . Multiple Vitamins-Minerals (CENTRUM SILVER PO) Take 1 tablet by mouth daily.        . nitroGLYCERIN (NITROSTAT) 0.4 MG SL tablet Place 0.4 mg under the tongue every 5 (five) minutes as needed. If 3 doses does not relieve pain, call 911.      . polyethylene glycol (MIRALAX / GLYCOLAX) packet Take 17 g by mouth 2 (two) times daily. Mix with 8 ounces water & drink  14 each  2  . potassium chloride (K-DUR) 10 MEQ tablet Take 10 mEq by mouth daily.        Marland Kitchen spironolactone (ALDACTONE) 50 MG tablet Take 1 tablet (50 mg total) by mouth daily.  60 tablet  3  . zolpidem (AMBIEN) 10 MG tablet Take 1 tablet (10 mg total) by mouth at bedtime as needed. Take 1/2 to 1 tab at bedtime as needed for insomnia  60 tablet  0   Review Of Systems: Per HPI with  the following additions: None Otherwise 12 point review of systems was performed and was unremarkable.  Physical Exam: Pulse: 114  Blood Pressure: 126/80 RR: 20   O2: 95 on RA Temp: 97.5  General: alert, cooperative, appears stated age and no distress HEENT: PERRLA, extra ocular movement intact and sclera clear, anicteric.  Dry mucus membranes.  Heart: S1, S2 normal, no murmur, rub or gallop, regular rate and rhythm Lungs: clear to auscultation, no wheezes or rales and unlabored breathing Abdomen: abdomen is soft without distension.  Some abdominal tenderness midline.  No masses, organomegaly or guarding Extremities: extremities normal, atraumatic, no cyanosis or edema Rectal:  Good tone Skin:no rashes Neurology: normal without focal findings, mental status, speech normal, alert and oriented x3, PERLA and reflexes normal and symmetric  Labs and Imaging: Lab Results  Component Value Date/Time   NA 139 07/02/2011  3:57 AM   K 3.8 07/02/2011  3:57 AM   CL 108 07/02/2011  3:57 AM   CO2 22 07/02/2011  3:57 AM   BUN 20 07/02/2011  3:57 AM   CREATININE 0.72 07/02/2011  3:57 AM   CREATININE 0.76 02/23/2011  3:32 PM   GLUCOSE 135* 07/02/2011  3:57 AM   Lab Results  Component Value Date   WBC 8.1 07/02/2011   HGB 10.5* 07/02/2011   HCT 32.4* 07/02/2011   MCV 89.8 07/02/2011   PLT 263 07/02/2011    Assessment and Plan: [name] is a 55 y.o. year old female presenting with lower GI bleed: 1. Lower GI Bleed:  Per patient report.  Hgb has dropped 2 points since last measured, no recent measurements in past year.  Patient is tachycardic, but blood pressure good.  Plan to admit patient to observation unit, telemetry bed as majority of patients with adverse outcomes from LGIB have cardiac issues resulting in adverse outcomes.  Plan to rehydrate patient and then remeasure her Hgb to see if there is any component of hemoconcentration currently.  Obtain orthostatics, coags.  As patient complained of  dyspnea on exertion and possible chest pain, will obtain cardiac enzymes x 2 sets.  Plan for GI versus surgery consult.  Of note, she had colectomy planned in past, but surgery recommended she obtain Lap-band prior to colectomy.  Insurance denied previous Lap-band, she has Sept 11 date to attend classes for bariatric surgery and is attempting again to go through procedure.   2. Abdominal pain:  Mid-epigastric.  Unsure if she has diabetes.  No EKG in ED.  Plan to obtain EKG and CE's as above.  Want to rule out ACS in 55 yo F with morbid obesity and possible DM.  Otherwise, unclear reason for epigastric pain.  Will follow.  If no improvement, may obtain CMET. 2. DM II:  She has diagnosis of diabetes in her records, but she does not take any medications.  Will follow CBGs while in house.   3.  HTN:  Patient on hydralazine and Imdur, spironolactone.  I do not see any record of her having heart failure.  Will hold blood pressure meds currently and watch pressure, especially in light of potential LGIB.   4.  FEN/GI:  NPO for now until we now bleeding has resolved and no instrumentation is necessary 5. Prophylaxis: SCDs, no heparin due to concern for bleed 6. Disposition: pending further workup  Renold Don MD [phone]

## 2011-07-03 LAB — CBC
HCT: 26.1 % — ABNORMAL LOW (ref 36.0–46.0)
Hemoglobin: 8.5 g/dL — ABNORMAL LOW (ref 12.0–15.0)
MCH: 29.2 pg (ref 26.0–34.0)
MCHC: 32.6 g/dL (ref 30.0–36.0)
MCV: 89.7 fL (ref 78.0–100.0)
Platelets: 228 10*3/uL (ref 150–400)
RBC: 2.91 MIL/uL — ABNORMAL LOW (ref 3.87–5.11)
RDW: 14.7 % (ref 11.5–15.5)
WBC: 7.9 10*3/uL (ref 4.0–10.5)

## 2011-07-03 LAB — BASIC METABOLIC PANEL
BUN: 8 mg/dL (ref 6–23)
CO2: 23 mEq/L (ref 19–32)
Calcium: 8.6 mg/dL (ref 8.4–10.5)
Chloride: 111 mEq/L (ref 96–112)
Creatinine, Ser: 0.53 mg/dL (ref 0.50–1.10)
GFR calc Af Amer: >60 mL/min (ref >60)
GFR calc non Af Amer: >60 mL/min (ref >60)
Glucose, Bld: 92 mg/dL (ref 70–99)
Potassium: 3.8 mEq/L (ref 3.5–5.1)
Sodium: 142 mEq/L (ref 135–145)

## 2011-07-04 ENCOUNTER — Telehealth: Payer: Self-pay | Admitting: Family Medicine

## 2011-07-04 LAB — BASIC METABOLIC PANEL
BUN: 5 mg/dL — ABNORMAL LOW (ref 6–23)
CO2: 25 mEq/L (ref 19–32)
Calcium: 9.1 mg/dL (ref 8.4–10.5)
Chloride: 109 mEq/L (ref 96–112)
Creatinine, Ser: 0.55 mg/dL (ref 0.50–1.10)
GFR calc Af Amer: >60 mL/min (ref >60)
GFR calc non Af Amer: >60 mL/min (ref >60)
Glucose, Bld: 105 mg/dL — ABNORMAL HIGH (ref 70–99)
Potassium: 3.5 mEq/L (ref 3.5–5.1)
Sodium: 143 mEq/L (ref 135–145)

## 2011-07-04 LAB — CBC
HCT: 27.3 % — ABNORMAL LOW (ref 36.0–46.0)
Hemoglobin: 9.2 g/dL — ABNORMAL LOW (ref 12.0–15.0)
MCH: 29.4 pg (ref 26.0–34.0)
MCHC: 33.7 g/dL (ref 30.0–36.0)
MCV: 87.2 fL (ref 78.0–100.0)
Platelets: 216 10*3/uL (ref 150–400)
RBC: 3.13 MIL/uL — ABNORMAL LOW (ref 3.87–5.11)
RDW: 15.9 % — ABNORMAL HIGH (ref 11.5–15.5)
WBC: 7.4 10*3/uL (ref 4.0–10.5)

## 2011-07-04 MED ORDER — TRAMADOL HCL 50 MG PO TABS
50.0000 mg | ORAL_TABLET | Freq: Four times a day (QID) | ORAL | Status: DC | PRN
Start: 2011-07-04 — End: 2011-12-01

## 2011-07-04 MED ORDER — OMEPRAZOLE 40 MG PO CPDR: 40.0000 mg | DELAYED_RELEASE_CAPSULE | Freq: Two times a day (BID) | ORAL | Status: DC

## 2011-07-04 MED ORDER — POLYETHYLENE GLYCOL 3350 17 G PO PACK: 17.0000 g | PACK | Freq: Two times a day (BID) | ORAL | Status: DC

## 2011-07-04 NOTE — Telephone Encounter (Signed)
Patient just discharged home from hospital today for BRBPR, presumed diverticulosis.  Patient called this evening. b/c of abdominal pain.  Described as burning pain in epigastrum, along with some general "aching" diffusely.  No nausea, no vomiting.  Has had 1 bowel movement right before she left hospital without active bleeding.  Small SM.  Still think there is some element of constipation.  On review of records she was not discharged home with Protonix, will add this and Tramadol for pain as she cannot take Advil.  Advised to come to ED if pain worsens.  She states she will pick up medications in AM, not hurting bad enough to get them tonight.

## 2011-07-05 LAB — TYPE AND SCREEN
ABO/RH(D): O POS
Antibody Screen: NEGATIVE
Unit division: 0
Unit division: 0

## 2011-07-06 ENCOUNTER — Telehealth: Payer: Self-pay | Admitting: Family Medicine

## 2011-07-06 MED ORDER — OMEPRAZOLE 40 MG PO CPDR: 40.0000 mg | DELAYED_RELEASE_CAPSULE | Freq: Every day | ORAL | Status: DC

## 2011-07-06 NOTE — Telephone Encounter (Signed)
Filled out prior authorization form for omeprazole. Of note, I changed the frequency to once daily. I sent a new script to the pharmacy.

## 2011-07-07 NOTE — Telephone Encounter (Signed)
Received notification from insurance that they have denied nexium for twice daily.  MD sentt in new rx for once daily dosage and this is covered. RN called pharmacy and advised that nexium twice daily was denied by [employer] so they will delete that rx.

## 2011-07-07 NOTE — Telephone Encounter (Signed)
Patient notified. Also faxed PA to insurance company.

## 2011-07-08 NOTE — Discharge Summary (Signed)
[name], [name] NO.:  [id number]  MEDICAL RECORD NO.:  [id number]  LOCATION:  6715                         FACILITY:  [facility]  PHYSICIAN:  Pearlean Brownie, M.D.DATE OF BIRTH:  April 20, 1956  DATE OF ADMISSION:  07/02/2011 DATE OF DISCHARGE:  07/04/2011                              DISCHARGE SUMMARY   PRIMARY CARE PHYSICIAN:  Dessa Phi, MD, [facility].  DISCHARGE DIAGNOSES: 1. Lower gastrointestinal bleed, likely diverticulosis. 2. Midepigastric abdominal pain. 3. Diabetes mellitus type 2. 4. Hypertension.  MEDICATIONS AT DISCHARGE: 1. MiraLax 17 grams p.o. p.r.n. constipation. 2. Fexofenadine 180 mg p.o. daily. 3. Ambien 10 mg p.o. nightly p.r.n. insomnia. 4. Diltiazem 1 tab p.o. b.i.d. 5. Ferrous sulfate 325 mg p.o. t.i.d. 6. Flonase 2 sprays nasally daily. 7. Furosemide 20 mg p.o. daily. 8. Gabapentin 100 mg 3 tabs p.o. t.i.d. p.r.n. 9. Hydralazine 25 mg p.o. daily. 10.Imdur 60 mg XR 1 tab p.o. daily. 11.Multivitamins. 12.Nitroglycerin sublingual 0.4 mg. 13.Potassium chloride 10 mEq p.o. daily. 14.Spironolactone 50 mg p.o. daily. 15.Prilosec 40 mg p.o. b.i.d.  LABS AT DISCHARGE:  BMET showed sodium 142, potassium 3.5, chloride 109, bicarb 25, glucose 105, BUN 5, creatinine 0.55, calcium 9.1.  CBC showed Kapral blood cells 7.4, hemoglobin 9.2, hematocrit 27.3, platelets 216.  PROCEDURES:  None.  Of note, the patient not receiving blood transfusion this admission.  BRIEF HOSPITAL COURSE:  Ms. [name] is a 55 year old female who is admitted for lower GI bleed.  She has a history of this in the past. For full information, please see completed history and physical on July 02, 2011. 1. Lower GI bleed.  The patient has history of diverticulosis.  She     was last admitted in 2009, and there is a question of if she     required a colectomy in the past.  However, she needed to go     through lap band procedure prior to this.   Insurance will not pay     for a lap band procedure; however, she is going through the process     currently to undergo a lap band surgery.  If this is the case, she     may need to follow up with Surgery on inpatient basis for possible     colectomy.  During this hospitalization, she had 2 episodes of     bleeding of bright red blood per rectum while in-house.  However,     her CBC on admission was 10.5.  Her CBC on discharge was 9.2.  This     seems to be within her baseline based on outpatient records as well     as records from previous hospitalizations.  She did not require     blood transfusion while in-house.  She stopped bleeding before this     occurred. 2. Midepigastric abdominal pain.  This seemed to be likely gastritis.     She is on Protonix 40 mg p.o. b.i.d.  She was discharged home on     Prilosec 40 mg p.o. b.i.d.  She likely will not need to be on such     a high-dose PPI for a long-term course,  but may benefit from short-     term course of this.  We will defer to the primary care physician. 3. Diabetes mellitus.  The patient is basically on metformin; however,     the med rec obtained from one of the pharmacies states that she is     actually not taking metformin.  However, I would not go as far as     to say she is not taking it at all; she may just be getting it from     another pharmacy.  We need to follow this on outpatient basis.  Her     glucose was 98 fasting while in-house.  May need to reconsider the     diagnosis of diabetes mellitus.  Again, I defer this to the primary     care physician. 4. High blood pressure.  The patient's blood pressures remained     relatively within normal limits while she was here.  She went up to     162, but basically her systolic pressures were 111-135 and she is     controlled on her home blood pressure medications. 5. Insomnia.  The patient was kept on her home Ambien.  FOLLOWUP ISSUES AT DISCHARGE:  The patient was discharged  home on a "Sunday over the Labor Day weekend.  We therefore were unable to make a followup appointment for her.  However, she was discharged home with instructions to call Family Practice first thing on Monday for appointment within the next week.  She was provided with the hospital number.  FOLLOWUP ISSUES: 1. As noted above, we need to make sure she is not having any further     bleeding.  We need to follow up whether she really intends to go     through with lap band surgery.  If this is the case and she     successfully completes the lap band surgery, she may need a     colectomy in the future.  Again, Surgery wanted to do this in the     past, but wanted her to go through lap band surgery first. 2. The patient did have regular blood glucoses while in-house.  We did     not follow this regularly.  On her outpatient medical record, she     is supposed to be on metformin.  However, the med rec we obtained     stated she did not have any metformin prescription.  Of course, she     could be getting her metformin from another pharmacy.  We need to     follow up whether she really needs to be taking metformin on an     outpatient basis. 3. High blood pressure.  The patient's blood pressures were controlled     while in-house.  We need to make sure she continues to have normal     blood pressures on an outpatient basis. 4. The patient discharged home on high dose PPI.  She is on Prilosec     40"  mg p.o. b.i.d.  This might help over the short run.  However,     the risks possibly could outweigh the benefits for high dose PPI in     the future over the     long term.  We will defer this to primary care physician with     recommendations to decrease to just once a day PPI some time in the     near future per her  discretion.  DISCHARGE CONDITION:  The patient was discharged to home in good medical condition.     Renold Don, MD   ______________________________ Pearlean Brownie,  M.D.    JW/MEDQ  D:  07/04/2011  T:  07/05/2011  Job:  [id number]  Electronically Signed by Renold Don  on 07/06/2011 10:14:07 AM Electronically Signed by Pearlean Brownie M.D. on 07/08/2011 03:26:52 PM

## 2011-07-13 ENCOUNTER — Telehealth: Payer: Self-pay | Admitting: Family Medicine

## 2011-07-13 NOTE — Telephone Encounter (Signed)
Spoke with patient . States  when she was discharge form hospital over a week ago she had no bleeding from stools, but then a couple days afterwards she started having blood in stool again stools. Not as much as before just when she has a BM.  Stool  is formed now and before it was like water with blood.  States she is feeling a little lightheaded now, advised her to go to ED for evaluation but she has grandchildren to pick up from school and other things to attend to today. She asks for appointment here tomorrow. Consulted with Dr. McDiarmid and he advises ok to come in tomorrow. If patient passes out , has chest pain or more bleeding go to ED immediately..  She does have someone with her now. Patient vices understanding.

## 2011-07-13 NOTE — Telephone Encounter (Signed)
Would like to talk to nurse about her blood count.  Was in hosp and is concerned about her blood count

## 2011-07-14 ENCOUNTER — Ambulatory Visit (INDEPENDENT_AMBULATORY_CARE_PROVIDER_SITE_OTHER): Payer: Medicare PPO | Admitting: Family Medicine

## 2011-07-14 ENCOUNTER — Encounter: Payer: Self-pay | Admitting: Family Medicine

## 2011-07-14 VITALS — BP 122/91 | HR 87 | Ht 66.0 in | Wt 295.0 lb

## 2011-07-14 DIAGNOSIS — K5731 Diverticulosis of large intestine without perforation or abscess with bleeding: Secondary | ICD-10-CM

## 2011-07-14 LAB — CBC
HCT: 27.1 % — ABNORMAL LOW (ref 36.0–46.0)
Hemoglobin: 8.5 g/dL — ABNORMAL LOW (ref 12.0–15.0)
MCH: 29.2 pg (ref 26.0–34.0)
MCHC: 31.4 g/dL (ref 30.0–36.0)
MCV: 93.1 fL (ref 78.0–100.0)
Platelets: 324 10*3/uL (ref 150–400)
RBC: 2.91 MIL/uL — ABNORMAL LOW (ref 3.87–5.11)
RDW: 16.2 % — ABNORMAL HIGH (ref 11.5–15.5)
WBC: 7.4 10*3/uL (ref 4.0–10.5)

## 2011-07-14 NOTE — Progress Notes (Signed)
  Subjective:    Patient ID: [name], female    DOB: 20-Mar-1956, 55 y.o.   MRN: [id number]  HPI Rectal bleeding: discharged from hospital 09/02 for this problem. Has history of bleeding colonic polyps. Waiting for colectomy due to persistence of this problem over past 5 years but requiring lap band procedure/gastric bypass for morbid obesity before this can be done. In the process of arranging this.  After hospital discharge, no bleeding until 09/10. Then started having one episode of bloody stool daily. Associated with loose stools.  ROS: lightheadedness but no falls  Review of Systems Per HPI with following: denies fevers, nausea/vomiting, difficulty eating, chest pain, difficulty breathing    Objective:   Physical Exam Gen: NAD, morbidly obese Psych: engaged, appropriate to questions CV: 2/6 SEM Pulm: CTAB Abd: obese, NABS, soft, tender lower quadrant (suprapubic area), no masses Rectal: small amount of stool palpated in rectal vault; dark tarry stool  Hemoccult: positive    Assessment & Plan:

## 2011-07-14 NOTE — H&P (Signed)
NAMEJASHAY, [name] NO.:  [id number]  MEDICAL RECORD NO.:  [id number]  LOCATION:  [facility]                         FACILITY:  [facility]  PHYSICIAN:  Paula Compton, MD        DATE OF BIRTH:  [birth year]  DATE OF ADMISSION:  07/02/2011 DATE OF DISCHARGE:                             HISTORY & PHYSICAL   PRIMARY CARE PHYSICIAN:  Dessa Phi, MD at [facility]  CHIEF COMPLAINT:  Lower GI bleed.  HISTORY OF PRESENT ILLNESS:  [name] is a 55 year old female with past medical history significant for known diverticulosis, diverticulosis, and several hospitalizations for the same, who is presenting today with 1-day history of lower gastrointestinal bleed. The patient states she last had a normal bowel movement yesterday morning.  Started at 11 p.m. last night she had an urge for bowel movement and began experiencing bright red blood per rectum.  She quantify this is "several clots of blood" in the toilet bowl.  This continued roughly every 30 minutes with 1-2 cups blood per bowel movements according to the patient.  Around 3 a.m. today she began feeling progressively weak and somewhat short of breath and therefore she came in to be evaluated.  In the emergency department, blood pressure is good, but the patient is tachycardic.  Her hemoglobin has dropped 2 points from 12.3 to 10.3 since last measurement in 2011.  The patient denies any further bleeding since coming to the emergency department and that states she does have urge for bowel movement along with some pretty constant abdominal pain.  She rates this pain is anywhere from 3-6 out of 10.  She thinks she may have had some chest pain and she did claim shortness of breath when she was brought into the emergency department, but this is resolved at rest.  She denies any nausea, vomiting or any recent illnesses.  No numbness or weakness noted.  PAST MEDICAL HISTORY: 1. Diabetes type 2 without  complications. 2. Morbid obesity. 3. Iron deficiency anemia. 4. Hypertension. 5. Chronic venous insufficiency. 7. Diverticulosis. 8. Kidney cyst. 9. Bilateral chronic foot pain. 10.History of CVA, unknown year. 11.History of GI hemorrhage. 12.De Quervain tenosynovitis.  PAST SURGICAL HISTORY:  Denies.  SOCIAL HISTORY:  The patient is divorced.  Lives in by herself.  The patient denies any drugs, alcohol or drug abuse.  FAMILY HISTORY:  Father died of colon cancer at age 79.  ALLERGIES:  ASPIRIN and NSAIDS which cause GI bleed.  MEDICATIONS: 1. Acetaminophen 650 mg 1-2 tablets p.o. b.i.d. as needed for pain. 2. Vitamin B complex. 3. Coreg 25 mg tablets p.o. b.i.d. 4. Catapres 0.2 mg tablets. 5. Cardizem 30 mg tablets. 6. Colace 100 mg tablets p.o. daily for constipation. 7. Nexium 40 mg tablets. 8. Allegra 180 mg daily. 9. Flonase 2 sprays in nostril each a.m. 10.Lasix 20 mg daily. 11.Neurontin 100 mg p.o. t.i.d. 12.Hydralazine 25 mg daily. 13.Imdur 60 mg daily. 14.Metformin 500 mg tablets p.o. daily. 15.Spironolactone 25 mg tablets p.o. daily. 16.Ambien 2 mg p.o. daily.  REVIEW OF SYSTEMS:  12-point review of systems completed and negative except for HPI.  PHYSICAL EXAMINATION:  VITAL SIGNS:  Showed  pulse 114, blood pressure 126/80, respiratory rate 20, O2 sats 95% on room air, temperature is 98.5.  GENERAL:  She is alert, obese, cooperative, the patient appears stated age in no distress. HEENT:  Pupils equal and reactive to light.  Extraocular movements intact.  Sclerae clear, anicteric.  Dry mucous membranes. HEART:  Good S1and S2.  Regular rate and rhythm without murmurs. LUNGS:  Clear to auscultation bilaterally.  No wheezing, rales or rhonchi noted. ABDOMEN:  Obese and soft throughout.  Some abdominal tenderness midline more in the epigastric area.  No masses, organomegaly or guarding. EXTREMITIES:  Normal, atraumatic.  No cyanosis or edema. RECTAL:   Revealed good rectal tone.  Gross blood involved and on glove. No real stool noted. SKIN:  No rashes noted. NEUROLOGY:  Alert and oriented x3.  Reflexes normal and symmetric throughout.  No focal findings and mental status good.  LABORATORY DATA:  BMET showed sodium of 139, potassium 3.8, chloride 108, bicarb 22, BUN 20, creatinine 0.72, creatinine 0.76, glucose 135. CBC showed WBCs 8.1, hemoglobin 10.5, hematocrit 32.4, platelets 263.  ASSESSMENT/PLAN:  [name] is a 55 year old female presenting with lower gastrointestinal bleed. 1. Lower gastrointestinal bleed per patient report.  Hemoglobin had     dropped to 2.7 last measured.  No recent measurements in the past     year.  The patient is tachycardic, but blood pressure is stable     currently.  Plan to admit the patient to observation unit and     telemetry bed as cardiac events are not uncommon in patients with GI bleeding.  Plan to rehydrate the patient and then remeasure     her hemoglobin to see if  there is component of hemoconcentration     currently.  Obtain orthostatics and coagulation studies.  As the     patient complained of dyspnea on exertion this is possible chest     pain as well as persistent epigastric tenderness to obtain cardiac     enzymes x2 sets.  Please see below plan for GI versus Surgery     consult.  Of note, she had a colectomy planned in the past in 2009,     but Surgery recommended she obtain lap band prior to colectomy.     Insurance denied previous lap-band at that time.  She currently has     September 11th date to attend classes to restart the process for     obtaining bariatric surgery and is to go through that procedure     again. 2. Abdominal pain mid epigastric.  No EKG in the emergency department.     Plan to obtain EKG and cardiac enzymes as above.  Want to rule out     ACS in a 55 year old female with morbid obesity and diabetes     mellitus.  Otherwise, unclear reason for epigastric pain.   Plan to     follow.  If no improvement, may obtain CMET and lipase to further     evaluate. 3. Diabetes mellitus type 2.  She was diagnosed with diabetes in her     records, however, the med rec performed by pharmacy shows that she     is not taking metformin.  When I asked the patient does state she     takes metformin.  I will follow her CBCs while in house. 4. Hypertension.  The patient will continue on hydralazine, Imdur,     spironolactone, and diltiazem came across on her med rec.  The     patient unable to remember exactly which medication she takes.  In     her chart here and I think she is prescribed carvedilol.  I have     look through her record for having heart failure.  We are going to     follow her blood pressures currently, especially this was concerned     with ongoing bleeding.  I hold her blood pressure medications and     watch her blood pressures.  Plan orthostatics as above. 5. FEN/GI:  N.p.o. for now until no bleeding has resolved and no     instrumentation is necessary. 6. Prophylaxis.  SCDs, no heparin due to concern for bleed.  DISPOSITION:  Pending further workup.     Renold Don, MD   ______________________________ Paula Compton, MD    JW/MEDQ  D:  07/02/2011  T:  07/02/2011  Job:  [id number]  Electronically Signed by Renold Don  on 07/06/2011 10:13:44 AM Electronically Signed by Paula Compton MD on 07/14/2011 02:39:31 PM

## 2011-07-14 NOTE — Assessment & Plan Note (Signed)
Presenting with hemoccult positive black tarry stool. Known history of bleeding from colonic polyp. Recently seen in hospital for this and has been hospitalized multiple times over past several years for this problem. Will check CBC today. If normal, will continue current therapy with oral iron. Strongly encouraged hydration. Transfuse if significantly lower than her baseline Hgb of 9-10. Will check iron panel. May consider giving IV iron loading dose to increase reticulocyte count in the setting of chronic lower GIB.  Patient amenable to colectomy, however, per her most recent D/C summary on 09/02, will need lap band procedure/gastric bypass for this. Patient is in the process of getting this arranged (attended orientation classes at [facility] for [place] yesterday) and is filling out paperwork. But unsure when this procedure will be done if she qualifies.  Outpatient GI follow-up is complicated by fact that she was previously seen by [facility] but has an overdue bill with them for $1,000+ that she is unable to pay. She didn't have insurance at that time, but she does now. Discussed with Dr. Leveda Anna regarding this; if she needs to be seen as outpatient and still has not been able to pay her bill with [facility], will probably have to refer her to another provider (e.g., ), although this would not an unfavorable situation

## 2011-07-14 NOTE — Patient Instructions (Signed)
We'll check your blood count today.  If it's really low, you may need a transfusion. We can consider giving you IV iron since you bleed chronically.  I'll call you with the results and we'll talk.  Stay well-hydrated. At least 8-10 glasses of liquids daily.

## 2011-07-15 ENCOUNTER — Telehealth: Payer: Self-pay | Admitting: Family Medicine

## 2011-07-15 LAB — IBC PANEL
%SAT: 16 % — ABNORMAL LOW (ref 20–55)
TIBC: 353 ug/dL (ref 250–470)
UIBC: 298 ug/dL (ref 125–400)

## 2011-07-15 LAB — IRON: Iron: 55 ug/dL (ref 42–145)

## 2011-07-15 NOTE — Telephone Encounter (Signed)
Left message. 1. Patient does not need blood transfusion based on CBC. 2. Gave option of getting IV iron at short-stay to help her retic due to possibility that she may continue to lose blood and may not be eligible for colectomy for next several months at least (since she will need gastric bypass/lap band surgery before, and this has not been scheduled yet).   Will call back again tomorrow to discuss. Informed patient that I will call her back.

## 2011-07-15 NOTE — Telephone Encounter (Signed)
Needs to know results of labs- not sure if she needs a transfusion or not.  pls advise.

## 2011-07-15 NOTE — Telephone Encounter (Signed)
To MD. Fleeger, Jessica Dawn  

## 2011-07-16 ENCOUNTER — Other Ambulatory Visit: Payer: Self-pay | Admitting: Family Medicine

## 2011-07-16 ENCOUNTER — Ambulatory Visit (INDEPENDENT_AMBULATORY_CARE_PROVIDER_SITE_OTHER): Payer: Medicare PPO | Admitting: Family Medicine

## 2011-07-16 VITALS — BP 107/70 | HR 96 | Temp 98.2°F | Wt 300.3 lb

## 2011-07-16 DIAGNOSIS — I10 Essential (primary) hypertension: Secondary | ICD-10-CM

## 2011-07-16 DIAGNOSIS — E1169 Type 2 diabetes mellitus with other specified complication: Secondary | ICD-10-CM

## 2011-07-16 DIAGNOSIS — Z23 Encounter for immunization: Secondary | ICD-10-CM

## 2011-07-16 DIAGNOSIS — R0789 Other chest pain: Secondary | ICD-10-CM

## 2011-07-16 DIAGNOSIS — D509 Iron deficiency anemia, unspecified: Secondary | ICD-10-CM

## 2011-07-16 LAB — VITAMIN B12: Vitamin B-12: 1687 pg/mL — ABNORMAL HIGH (ref 211–911)

## 2011-07-16 LAB — POCT GLYCOSYLATED HEMOGLOBIN (HGB A1C): Hemoglobin A1C: 5.6

## 2011-07-16 MED ORDER — VITAMIN C 250 MG PO TABS: 250.0000 mg | ORAL_TABLET | Freq: Three times a day (TID) | ORAL | Status: DC

## 2011-07-16 MED ORDER — CARVEDILOL 12.5 MG PO TABS: 12.5000 mg | ORAL_TABLET | Freq: Two times a day (BID) | ORAL | Status: DC

## 2011-07-16 NOTE — Progress Notes (Addendum)
  Subjective:    Patient ID: [name], female    DOB: June 10, 1956, 55 y.o.   MRN: [id number]  HPI Pt is here for hospital f/u for lower gi bleed secondary to diverticulosis and diabetes, hypertension and health maintenance.  1. Lower GI bleed: has now resolved. Patient was seen by Dr. Madolyn Frieze two days ago. We reviewed the plan to increase iron to TID, add TID vitamin C. We also reviewed CBC. Patient denies N/V/D. She admits to lightheadedness. She denies CP, SOB, fainting. She went to the information session regarding gastric bypass which she intends to have prior to her partial colectomy. She is excited about the prospects of the surgery and has a form for me to sign and needs a letter.  2. HTN: BP well controlled. Compliant with medication. See above for ROS.  3. DM: CBGs in the 150s during the day and in the 110s during the morning. No CBGs < 60 or > 200. Compliant with metformin. Last diabetic eye exam 2 years ago. No foot ulcers.  4. Obesity: patient gaining weight. Has been evaluated for gastric bypass surgery. Is excited about the prospects.  5. Health maintenance: interested in flu vaccine.   Reviewed and updated pertinent past medical history, medications and social history.    Review of Systems As per HPI    Objective:   Physical Exam  OBJECTIVE: Appearance: alert, well appearing, and in no distress and overweight. BP 107/70  Pulse 96  Temp(Src) 98.2 F (36.8 C) (Oral)  Wt 300 lb 4.8 oz (136.215 kg)  Exam: fundi normal, heart sounds normal rate, regular rhythm, normal S1, S2, no murmurs, rubs, clicks or gallops, chest clear, no hepatosplenomegaly, feet: warm, good capillary refill, dry cracking heels and reduced sensation at heels.     Assessment & Plan:

## 2011-07-16 NOTE — Assessment & Plan Note (Signed)
A: resolved. P: monitor for recurrence.

## 2011-07-16 NOTE — Progress Notes (Signed)
Addended by: Jone Baseman D on: 07/16/2011 05:12 PM   Modules accepted: Orders

## 2011-07-16 NOTE — Telephone Encounter (Signed)
Discussed plan with patient. Will increase oral iron from bid to tid. Will take oral iron with Vitamin C to help with absorption. Hold off on IV iron for now.

## 2011-07-16 NOTE — Patient Instructions (Addendum)
[name],  Thank you for coming in today. When you are due for a refill, please pick up the decreased dose of coreg (12.5 mg twice daily).  Please stop taking the metformin. Please drop off your form for me to review and sign. I will contact you regarding today's blood work.   -Dr. Armen Pickup

## 2011-07-16 NOTE — Assessment & Plan Note (Signed)
A: well controlled. Meds: compliant with lowest dose of metformin. P: D/C metformin. Follow-up with A1c in 3 mos. Continue low carb, high veggie diet.

## 2011-07-16 NOTE — Assessment & Plan Note (Signed)
A: well controlled. Meds: complaint with medications. No side effects.  P: 1/2 dose of coreg given patient's lightheadedness.

## 2011-07-16 NOTE — Assessment & Plan Note (Signed)
A: Anemia 2/2 to iron deficiency and blood loss. P: Increase iron to TID, start vitamin C TID.  Repeat CBC.

## 2011-07-17 LAB — CBC
HCT: 27.2 % — ABNORMAL LOW (ref 36.0–46.0)
Hemoglobin: 8.5 g/dL — ABNORMAL LOW (ref 12.0–15.0)
MCH: 29.3 pg (ref 26.0–34.0)
MCHC: 31.3 g/dL (ref 30.0–36.0)
MCV: 93.8 fL (ref 78.0–100.0)
Platelets: 369 10*3/uL (ref 150–400)
RBC: 2.9 MIL/uL — ABNORMAL LOW (ref 3.87–5.11)
RDW: 16.2 % — ABNORMAL HIGH (ref 11.5–15.5)
WBC: 8.1 10*3/uL (ref 4.0–10.5)

## 2011-07-19 ENCOUNTER — Telehealth: Payer: Self-pay | Admitting: *Deleted

## 2011-07-19 NOTE — Telephone Encounter (Signed)
Patient brought in form for MD to complete for the Bariatric program. Placed in MD box.

## 2011-07-23 NOTE — Telephone Encounter (Signed)
Form completed, faxed, pt called. She can pick up a copy of the form on Monday. She requested a new referral to [facility] for a colonoscopy because she has an outstanding balance with [facility] and cannot get one.

## 2011-07-26 LAB — BASIC METABOLIC PANEL
BUN: 2 — ABNORMAL LOW
BUN: 3 — ABNORMAL LOW
BUN: 3 — ABNORMAL LOW
BUN: 4 — ABNORMAL LOW
BUN: 4 — ABNORMAL LOW
BUN: 6
CO2: 23
CO2: 25
CO2: 25
CO2: 25
CO2: 26
CO2: 26
Calcium: 7.8 — ABNORMAL LOW
Calcium: 7.9 — ABNORMAL LOW
Calcium: 8.3 — ABNORMAL LOW
Calcium: 8.5
Calcium: 8.5
Calcium: 8.6
Chloride: 104
Chloride: 108
Chloride: 108
Chloride: 108
Chloride: 109
Chloride: 109
Creatinine, Ser: 0.61
Creatinine, Ser: 0.62
Creatinine, Ser: 0.62
Creatinine, Ser: 0.67
Creatinine, Ser: 0.68
Creatinine, Ser: 0.76
GFR calc Af Amer: >60
GFR calc Af Amer: >60
GFR calc Af Amer: >60
GFR calc Af Amer: >60
GFR calc Af Amer: >60
GFR calc Af Amer: >60
GFR calc non Af Amer: >60
GFR calc non Af Amer: >60
GFR calc non Af Amer: >60
GFR calc non Af Amer: >60
GFR calc non Af Amer: >60
GFR calc non Af Amer: >60
Glucose, Bld: 106 — ABNORMAL HIGH
Glucose, Bld: 118 — ABNORMAL HIGH
Glucose, Bld: 126 — ABNORMAL HIGH
Glucose, Bld: 131 — ABNORMAL HIGH
Glucose, Bld: 80
Glucose, Bld: 99
Potassium: 3.3 — ABNORMAL LOW
Potassium: 3.5
Potassium: 3.6
Potassium: 3.6
Potassium: 3.7
Potassium: 4.1
Sodium: 137
Sodium: 138
Sodium: 138
Sodium: 139
Sodium: 139
Sodium: 141

## 2011-07-26 LAB — CBC
HCT: 22.1 — ABNORMAL LOW
HCT: 23.9 — ABNORMAL LOW
HCT: 24.1 — ABNORMAL LOW
HCT: 24.3 — ABNORMAL LOW
HCT: 24.9 — ABNORMAL LOW
HCT: 27.2 — ABNORMAL LOW
HCT: 28.3 — ABNORMAL LOW
HCT: 28.6 — ABNORMAL LOW
HCT: 29.6 — ABNORMAL LOW
HCT: 30.1 — ABNORMAL LOW
HCT: 30.7 — ABNORMAL LOW
HCT: 31.8 — ABNORMAL LOW
HCT: 33.7 — ABNORMAL LOW
Hemoglobin: 10.1 — ABNORMAL LOW
Hemoglobin: 10.1 — ABNORMAL LOW
Hemoglobin: 10.2 — ABNORMAL LOW
Hemoglobin: 10.7 — ABNORMAL LOW
Hemoglobin: 11.4 — ABNORMAL LOW
Hemoglobin: 7.5 — CL
Hemoglobin: 8 — ABNORMAL LOW
Hemoglobin: 8.1 — ABNORMAL LOW
Hemoglobin: 8.3 — ABNORMAL LOW
Hemoglobin: 8.4 — ABNORMAL LOW
Hemoglobin: 9.4 — ABNORMAL LOW
Hemoglobin: 9.4 — ABNORMAL LOW
Hemoglobin: 9.4 — ABNORMAL LOW
MCHC: 33
MCHC: 33.2
MCHC: 33.3
MCHC: 33.5
MCHC: 33.6
MCHC: 33.7
MCHC: 33.8
MCHC: 33.8
MCHC: 33.8
MCHC: 33.9
MCHC: 34
MCHC: 34.1
MCHC: 34.4
MCV: 88.4
MCV: 88.7
MCV: 88.9
MCV: 89
MCV: 89.1
MCV: 89.3
MCV: 90.1
MCV: 90.2
MCV: 90.6
MCV: 90.8
MCV: 91.1
MCV: 91.1
MCV: 91.3
Platelets: 211
Platelets: 212
Platelets: 213
Platelets: 218
Platelets: 226
Platelets: 233
Platelets: 238
Platelets: 252
Platelets: 252
Platelets: 260
Platelets: 266
Platelets: 288
Platelets: 347
RBC: 2.43 — ABNORMAL LOW
RBC: 2.65 — ABNORMAL LOW
RBC: 2.68 — ABNORMAL LOW
RBC: 2.7 — ABNORMAL LOW
RBC: 2.75 — ABNORMAL LOW
RBC: 3.04 — ABNORMAL LOW
RBC: 3.1 — ABNORMAL LOW
RBC: 3.13 — ABNORMAL LOW
RBC: 3.34 — ABNORMAL LOW
RBC: 3.35 — ABNORMAL LOW
RBC: 3.45 — ABNORMAL LOW
RBC: 3.58 — ABNORMAL LOW
RBC: 3.8 — ABNORMAL LOW
RDW: 14.6
RDW: 14.6
RDW: 14.8
RDW: 15
RDW: 15
RDW: 15
RDW: 15
RDW: 15.1
RDW: 15.1
RDW: 15.1
RDW: 15.3
RDW: 15.5
RDW: 15.5
WBC: 10
WBC: 10.1
WBC: 10.6 — ABNORMAL HIGH
WBC: 10.6 — ABNORMAL HIGH
WBC: 7.1
WBC: 7.5
WBC: 8.7
WBC: 8.8
WBC: 9.5
WBC: 9.6
WBC: 9.6
WBC: 9.8
WBC: 9.9

## 2011-07-26 LAB — I-STAT 8, (EC8 V) (CONVERTED LAB)
BUN: 12
Bicarbonate: 24.7 — ABNORMAL HIGH
Chloride: 106
Glucose, Bld: 141 — ABNORMAL HIGH
HCT: 32 — ABNORMAL LOW
Hemoglobin: 10.9 — ABNORMAL LOW
Operator id: 294511
Potassium: 3.2 — ABNORMAL LOW
Sodium: 140
TCO2: 26
pCO2, Ven: 40.8 — ABNORMAL LOW
pH, Ven: 7.39 — ABNORMAL HIGH

## 2011-07-26 LAB — COMPREHENSIVE METABOLIC PANEL
ALT: 18
AST: 16
Albumin: 3.1 — ABNORMAL LOW
Alkaline Phosphatase: 66
BUN: 6
CO2: 26
Calcium: 8.9
Chloride: 108
Creatinine, Ser: 0.67
GFR calc Af Amer: >60
GFR calc non Af Amer: >60
Glucose, Bld: 106 — ABNORMAL HIGH
Potassium: 3.9
Sodium: 141
Total Bilirubin: 0.5
Total Protein: 6.4

## 2011-07-26 LAB — HEMOGLOBIN AND HEMATOCRIT, BLOOD
HCT: 24.1 — ABNORMAL LOW
HCT: 25.8 — ABNORMAL LOW
HCT: 28.4 — ABNORMAL LOW
HCT: 29.2 — ABNORMAL LOW
HCT: 29.2 — ABNORMAL LOW
HCT: 29.5 — ABNORMAL LOW
HCT: 29.9 — ABNORMAL LOW
HCT: 30 — ABNORMAL LOW
HCT: 30.3 — ABNORMAL LOW
HCT: 31.5 — ABNORMAL LOW
HCT: 32.3 — ABNORMAL LOW
Hemoglobin: 10 — ABNORMAL LOW
Hemoglobin: 10.1 — ABNORMAL LOW
Hemoglobin: 10.3 — ABNORMAL LOW
Hemoglobin: 10.4 — ABNORMAL LOW
Hemoglobin: 10.6 — ABNORMAL LOW
Hemoglobin: 10.8 — ABNORMAL LOW
Hemoglobin: 8.1 — ABNORMAL LOW
Hemoglobin: 8.7 — ABNORMAL LOW
Hemoglobin: 9.7 — ABNORMAL LOW
Hemoglobin: 9.7 — ABNORMAL LOW
Hemoglobin: 9.8 — ABNORMAL LOW

## 2011-07-26 LAB — CROSSMATCH
ABO/RH(D): O POS
ABO/RH(D): O POS
ABO/RH(D): O POS
Antibody Screen: NEGATIVE
Antibody Screen: NEGATIVE
Antibody Screen: NEGATIVE

## 2011-07-26 LAB — DIFFERENTIAL
Basophils Absolute: 0
Basophils Relative: 0
Eosinophils Absolute: 0.3
Eosinophils Relative: 4
Lymphocytes Relative: 30
Lymphs Abs: 2.2
Monocytes Absolute: 0.5
Monocytes Relative: 7
Neutro Abs: 4.4
Neutrophils Relative %: 59

## 2011-07-26 LAB — URINALYSIS, ROUTINE W REFLEX MICROSCOPIC
Bilirubin Urine: NEGATIVE
Glucose, UA: NEGATIVE
Glucose, UA: NEGATIVE
Hgb urine dipstick: NEGATIVE
Hgb urine dipstick: NEGATIVE
Ketones, ur: 15 — AB
Ketones, ur: NEGATIVE
Leukocytes, UA: NEGATIVE
Nitrite: NEGATIVE
Nitrite: NEGATIVE
Protein, ur: 100 — AB
Protein, ur: NEGATIVE
Specific Gravity, Urine: 1.025
Specific Gravity, Urine: 1.041 — ABNORMAL HIGH
Urobilinogen, UA: 0.2
Urobilinogen, UA: 1
pH: 5.5
pH: 5.5

## 2011-07-26 LAB — HAPTOGLOBIN: Haptoglobin: 149

## 2011-07-26 LAB — URINE MICROSCOPIC-ADD ON

## 2011-07-26 LAB — POCT I-STAT CREATININE
Creatinine, Ser: 1
Operator id: 294511

## 2011-07-26 LAB — OCCULT BLOOD X 1 CARD TO LAB, STOOL: Fecal Occult Bld: POSITIVE

## 2011-07-26 LAB — PROTIME-INR
INR: 1
Prothrombin Time: 13.8

## 2011-07-26 LAB — URINE CULTURE
Colony Count: NO GROWTH
Culture: NO GROWTH
Special Requests: NEGATIVE

## 2011-07-26 LAB — APTT: aPTT: 35

## 2011-07-26 LAB — HEMATOCRIT: HCT: 28.9 — ABNORMAL LOW

## 2011-07-26 LAB — PREPARE RBC (CROSSMATCH)

## 2011-07-26 LAB — HEMOGLOBIN: Hemoglobin: 9.9 — ABNORMAL LOW

## 2011-08-02 ENCOUNTER — Other Ambulatory Visit: Payer: Self-pay | Admitting: Family Medicine

## 2011-08-02 DIAGNOSIS — I10 Essential (primary) hypertension: Secondary | ICD-10-CM

## 2011-08-02 MED ORDER — DILTIAZEM HCL 30 MG PO TABS: 30.0000 mg | ORAL_TABLET | Freq: Two times a day (BID) | ORAL | Status: DC

## 2011-08-03 LAB — HEMOGLOBIN AND HEMATOCRIT, BLOOD
HCT: 29.2 — ABNORMAL LOW
HCT: 29.3 — ABNORMAL LOW
HCT: 29.3 — ABNORMAL LOW
HCT: 29.4 — ABNORMAL LOW
HCT: 30 — ABNORMAL LOW
HCT: 30.1 — ABNORMAL LOW
HCT: 30.4 — ABNORMAL LOW
HCT: 31.5 — ABNORMAL LOW
Hemoglobin: 10 — ABNORMAL LOW
Hemoglobin: 10.4 — ABNORMAL LOW
Hemoglobin: 9.6 — ABNORMAL LOW
Hemoglobin: 9.8 — ABNORMAL LOW
Hemoglobin: 9.8 — ABNORMAL LOW
Hemoglobin: 9.8 — ABNORMAL LOW
Hemoglobin: 9.9 — ABNORMAL LOW
Hemoglobin: 9.9 — ABNORMAL LOW

## 2011-08-03 LAB — GLUCOSE, CAPILLARY
Glucose-Capillary: 101 — ABNORMAL HIGH
Glucose-Capillary: 101 — ABNORMAL HIGH
Glucose-Capillary: 104 — ABNORMAL HIGH
Glucose-Capillary: 108 — ABNORMAL HIGH
Glucose-Capillary: 110 — ABNORMAL HIGH
Glucose-Capillary: 117 — ABNORMAL HIGH
Glucose-Capillary: 119 — ABNORMAL HIGH
Glucose-Capillary: 124 — ABNORMAL HIGH
Glucose-Capillary: 133 — ABNORMAL HIGH
Glucose-Capillary: 79
Glucose-Capillary: 84
Glucose-Capillary: 95
Glucose-Capillary: 96
Glucose-Capillary: 98
Glucose-Capillary: 99

## 2011-08-03 LAB — POCT I-STAT, CHEM 8
BUN: 23
Calcium, Ion: 1.19
Chloride: 107
Creatinine, Ser: 1.1
Glucose, Bld: 145 — ABNORMAL HIGH
HCT: 34 — ABNORMAL LOW
Hemoglobin: 11.6 — ABNORMAL LOW
Potassium: 3.7
Sodium: 142
TCO2: 27

## 2011-08-03 LAB — TYPE AND SCREEN
ABO/RH(D): O POS
Antibody Screen: NEGATIVE

## 2011-08-03 LAB — BASIC METABOLIC PANEL
BUN: 5 — ABNORMAL LOW
CO2: 27
Calcium: 8.8
Chloride: 111
Creatinine, Ser: 0.62
GFR calc Af Amer: >60
GFR calc non Af Amer: >60
Glucose, Bld: 95
Potassium: 4
Sodium: 141

## 2011-08-03 LAB — CBC
HCT: 30.1 — ABNORMAL LOW
HCT: 34.7 — ABNORMAL LOW
Hemoglobin: 11.3 — ABNORMAL LOW
Hemoglobin: 9.8 — ABNORMAL LOW
MCHC: 32.5
MCHC: 32.7
MCV: 88.2
MCV: 90.9
Platelets: 282
Platelets: 301
RBC: 3.41 — ABNORMAL LOW
RBC: 3.82 — ABNORMAL LOW
RDW: 15
RDW: 18.1 — ABNORMAL HIGH
WBC: 7
WBC: 8.2

## 2011-08-03 LAB — CROSSMATCH
ABO/RH(D): O POS
Antibody Screen: NEGATIVE

## 2011-08-03 LAB — DIFFERENTIAL
Basophils Absolute: 0
Basophils Relative: 0
Eosinophils Absolute: 0.2
Eosinophils Relative: 3
Lymphocytes Relative: 27
Lymphs Abs: 1.9
Monocytes Absolute: 0.4
Monocytes Relative: 6
Neutro Abs: 4.5
Neutrophils Relative %: 64

## 2011-08-03 LAB — COMPREHENSIVE METABOLIC PANEL
ALT: 14
AST: 17
Albumin: 2.9 — ABNORMAL LOW
Alkaline Phosphatase: 61
BUN: 13
CO2: 26
Calcium: 8.8
Chloride: 107
Creatinine, Ser: 0.54
GFR calc Af Amer: >60
GFR calc non Af Amer: >60
Glucose, Bld: 102 — ABNORMAL HIGH
Potassium: 3.6
Sodium: 140
Total Bilirubin: 0.4
Total Protein: 5.7 — ABNORMAL LOW

## 2011-08-03 LAB — PREPARE RBC (CROSSMATCH)

## 2011-08-03 LAB — OCCULT BLOOD X 1 CARD TO LAB, STOOL
Fecal Occult Bld: POSITIVE
Fecal Occult Bld: POSITIVE

## 2011-08-03 LAB — HEMOGLOBIN: Hemoglobin: 9.3 — ABNORMAL LOW

## 2011-08-03 LAB — APTT: aPTT: 30

## 2011-08-03 LAB — PROTIME-INR
INR: 1
Prothrombin Time: 13.9

## 2011-08-04 ENCOUNTER — Other Ambulatory Visit: Payer: Self-pay | Admitting: Family Medicine

## 2011-08-05 NOTE — Telephone Encounter (Signed)
Pt to call her cardiologist regarding refills for imdur and cardizem as both are being managed by cards.

## 2011-08-09 ENCOUNTER — Other Ambulatory Visit: Payer: Self-pay | Admitting: Family Medicine

## 2011-08-09 NOTE — Telephone Encounter (Signed)
Refill request

## 2011-08-31 ENCOUNTER — Ambulatory Visit (INDEPENDENT_AMBULATORY_CARE_PROVIDER_SITE_OTHER): Payer: Medicare PPO | Admitting: *Deleted

## 2011-08-31 ENCOUNTER — Telehealth: Payer: Self-pay | Admitting: Family Medicine

## 2011-08-31 VITALS — Ht 64.75 in | Wt 292.4 lb

## 2011-08-31 DIAGNOSIS — E669 Obesity, unspecified: Secondary | ICD-10-CM

## 2011-08-31 NOTE — Progress Notes (Signed)
Patient in for weight and height. Plans now to have Baractric surgery thru [facility]. She will make appointment with Dr. Armen Pickup. There are several items that need to be documented by MD .  Weight today 292.4 height 64 3/4 inches.

## 2011-08-31 NOTE — Telephone Encounter (Signed)
[name] states that she is having surgery & needs some type of clearance before so. She would like to talk to with Dr. Armen Pickup.

## 2011-09-03 NOTE — Telephone Encounter (Signed)
Called pt at home to get more information regarding clearance form and what she needs for me. Asked pt to call the clinic with details. Will check box this afternoon for any forms from the pt.

## 2011-09-06 MED ORDER — FERROUS SULFATE 325 (65 FE) MG PO TABS: 325.0000 mg | ORAL_TABLET | Freq: Three times a day (TID) | ORAL | Status: DC

## 2011-09-06 NOTE — Telephone Encounter (Signed)
Addended by: Dessa Phi on: 09/06/2011 01:43 PM   Modules accepted: Orders

## 2011-09-06 NOTE — Telephone Encounter (Signed)
Refilled iron. Attempted to call back pt. No answer. Left VM asking pt to call back and let the office know what times are best to reach her at home/work.

## 2011-09-06 NOTE — Telephone Encounter (Signed)
Patient returning Dr. Armen Pickup call, says to call her anytime after 2 pm

## 2011-09-06 NOTE — Telephone Encounter (Signed)
Patient returning call, would like Dr. Armen Pickup to return her call today & is also asking about her refill on iron pills, request already in MD box.

## 2011-09-21 ENCOUNTER — Encounter: Payer: Self-pay | Admitting: Family Medicine

## 2011-09-21 ENCOUNTER — Ambulatory Visit (INDEPENDENT_AMBULATORY_CARE_PROVIDER_SITE_OTHER): Payer: Medicare PPO | Admitting: Family Medicine

## 2011-09-21 VITALS — BP 152/89 | HR 76 | Temp 98.1°F | Ht 64.75 in | Wt 294.0 lb

## 2011-09-21 DIAGNOSIS — M75100 Unspecified rotator cuff tear or rupture of unspecified shoulder, not specified as traumatic: Secondary | ICD-10-CM

## 2011-09-21 DIAGNOSIS — IMO0002 Reserved for concepts with insufficient information to code with codable children: Secondary | ICD-10-CM

## 2011-09-21 DIAGNOSIS — M7550 Bursitis of unspecified shoulder: Secondary | ICD-10-CM

## 2011-09-21 DIAGNOSIS — M7530 Calcific tendinitis of unspecified shoulder: Secondary | ICD-10-CM | POA: Insufficient documentation

## 2011-09-21 NOTE — Assessment & Plan Note (Signed)
Pt tolerated procedure well able to move arm much more, given red flags and what to look out for Given exercises to do as well.  RTC in 2 weeks, if not better would treat with nitro patch and see if improves, may benefit from ultrasound down at sports medicine to look for a tear of rotator cuff.

## 2011-09-21 NOTE — Progress Notes (Signed)
  Subjective:    Patient ID: [name], female    DOB: [birth year], 55 y.o.   MRN: [id number]  HPI Left shoulder pain-  Pt  States this started last night, has started to work out more recently at [employer].  Pt states pain is left sided radiate down her arm. Pt denies weakness, loss of function, or numbness.   Pt denies any type if trauma, no previous injury to shoulder. Pt describes pain as nagging with some sharp radiation.    Review of Systems Denies fever, chills, nausea vomiting abdominal pain, dysuria, chest pain, shortness of breath dyspnea on exertion or numbness in extremities Past medical history, social, surgical and family history all reviewed.      Objective:   Physical Exam Gen: NAD, alert Shoulder:Left  Inspection reveals no abnormalities, atrophy or asymmetry. Palpation is normal with no tenderness over AC joint or bicipital groove. TTP in sub acromion space.  ROM is decreased in abduction.  Rotator cuff strength weak on left 4/5 throughout. No signs of impingement with negative Neer and Hawkin's tests,  + empty can. Speeds and Yergason's tests normal. No labral pathology noted with negative Obrien's, negative clunk and good stability. Normal scapular function observed. No painful arc and no drop arm sign. No apprehension sign     Procedure Note Procedure note After verbal and written consent given pt was prepped with betadine.  1:3 kenalog 40 to lidocaine used in sub acromion space left side posterior approach.  Pt minimal bleeding dressed with band aid, Pt given red flags to look for pt had better pain control immediatly.    Assessment & Plan:

## 2011-09-21 NOTE — Patient Instructions (Signed)
I injected the left shoulder today with some steroids. These will start working in the next 3-4 days and continued to work hopefully. I when she to do the exercises I am giving you. And try to avoid any lifting over your head for the next week or 2 I when she to come back and see me or Dr. Armen Pickup is in 2 weeks to make sure you're doing better. If not doing better we may consider doing a nitroglycerin patch at that time.

## 2011-09-27 ENCOUNTER — Ambulatory Visit (INDEPENDENT_AMBULATORY_CARE_PROVIDER_SITE_OTHER): Payer: Medicare PPO | Admitting: Family Medicine

## 2011-09-27 ENCOUNTER — Encounter: Payer: Self-pay | Admitting: Family Medicine

## 2011-09-27 DIAGNOSIS — E669 Obesity, unspecified: Secondary | ICD-10-CM

## 2011-09-27 DIAGNOSIS — M7550 Bursitis of unspecified shoulder: Secondary | ICD-10-CM

## 2011-09-27 DIAGNOSIS — M75100 Unspecified rotator cuff tear or rupture of unspecified shoulder, not specified as traumatic: Secondary | ICD-10-CM

## 2011-09-27 DIAGNOSIS — IMO0002 Reserved for concepts with insufficient information to code with codable children: Secondary | ICD-10-CM

## 2011-09-27 NOTE — Patient Instructions (Addendum)
Ms. Kanzler,  Thank you so much for coming to see me today. It is a  pleasure seeing you.  Please call [facility] and asked them if more than one will provider in the same practice can document encounters with you the regarding her weight loss. Please call if any side effects are a nutritionist scheduled appointment for next month: She sees patients on Monday, Tuesday, and Thursday. Congratulations on your weight loss! Keep up the excellent work.  Please plan to follow up with me at the end of December.   -Dr. Armen Pickup   Referral to sports medicine clinic for right shoulder ultrasound. Diagnosis subacromial bursitis.

## 2011-09-27 NOTE — Assessment & Plan Note (Signed)
A: The pain is resolved. Patient now with neuropathy suggestive of impingement that might benefit from physical therapy. Patient compliant with home shoulder PT. P: Refer to sports medicine clinic for right shoulder ultrasound to evaluate rotator cuff.

## 2011-09-27 NOTE — Assessment & Plan Note (Signed)
A: Improved. Patient compliant with diet and exercise program. Patient is currently undergoing 6 month process of medically supervised weight loss trial in anticipation of bariatric surgery next spring. P: Continue current diet and exercise plan. Probably the patient would benefit from increased exercise. Referral to clinical nutritionists for assistance with management: goal setting, teaching, and follow up.

## 2011-09-27 NOTE — Progress Notes (Signed)
  Subjective:    Patient ID: [name], female    DOB: [birth year], 55 y.o.   MRN: [id number]  HPI [name] comes in for followup of her medically supervised weight loss trial prior to bariatric surgery. She reports compliance with his [facility] diet is a low calorie diet consisting about 1400 calories. She consumes mostly lean protein and vegetables and very little carbohydrates. For exercise she attends [employer] Monday through Thursday for 30 minutes in addition she walks 2 blocks sticker about 30 minutes daily. On Friday through Sunday she attends a class the last 30 minutes. With her consistent diet and exercise his weight has lost 16 pounds in the past 3 months. She reports feeling positive about her efforts and the possibility of having bariatric surgery.  Regarding her left shoulder: [name] reports resolution of pain her sutures subacromial bursitis injection. She now has numbness from the right shoulder to her antecubital fossa lateral aspect of her upper arm. She denies fevers, chills, nigh,  sweats. She reports being compliant with her gabapentin.  Review of Systems Pertinent review of systems as per history of present illness    Objective:   Physical Exam Gen: obese female, NAD Shoulder:Left  Inspection reveals no abnormalities, atrophy or asymmetry. Palpation is normal with no tenderness over AC joint or bicipital groove. TTP in sub acromion space.  ROM is decreased in abduction.  Rotator cuff strength weak on left 4/5 throughout. Impingement: Positive empty can. Negative  Neer and Hawkin's tests. Speeds and Yergason's tests normal. No labral pathology noted with negative Obrien's, negative clunk and good stability. Normal scapular function observed. No painful arc and no drop arm sign. No apprehension sign       Assessment & Plan:

## 2011-09-28 ENCOUNTER — Other Ambulatory Visit: Payer: Self-pay | Admitting: Family Medicine

## 2011-09-28 DIAGNOSIS — Z1231 Encounter for screening mammogram for malignant neoplasm of breast: Secondary | ICD-10-CM

## 2011-09-28 NOTE — Progress Notes (Signed)
Addended by: Dessa Phi on: 09/28/2011 02:15 PM   Modules accepted: Orders

## 2011-10-05 ENCOUNTER — Telehealth: Payer: Self-pay | Admitting: Family Medicine

## 2011-10-05 NOTE — Telephone Encounter (Signed)
Called pt back. She can see Wyona Almas, but I have to complete and sign off on all of her paperwork. This should be fine. Patient has not been treated with an ACE per her recollection but is willing to add it on for treatment of her diabetes.

## 2011-10-05 NOTE — Telephone Encounter (Signed)
Pt is asking to speak with Dr Armen Pickup in regards to her surgery at [facility].

## 2011-10-07 ENCOUNTER — Ambulatory Visit: Payer: Self-pay | Admitting: Family Medicine

## 2011-10-13 ENCOUNTER — Ambulatory Visit: Payer: Self-pay

## 2011-10-15 ENCOUNTER — Ambulatory Visit (INDEPENDENT_AMBULATORY_CARE_PROVIDER_SITE_OTHER): Payer: Medicare PPO | Admitting: Family Medicine

## 2011-10-15 ENCOUNTER — Encounter: Payer: Self-pay | Admitting: Family Medicine

## 2011-10-15 DIAGNOSIS — J111 Influenza due to unidentified influenza virus with other respiratory manifestations: Secondary | ICD-10-CM | POA: Insufficient documentation

## 2011-10-15 MED ORDER — ALBUTEROL SULFATE HFA 108 (90 BASE) MCG/ACT IN AERS: 2.0000 | INHALATION_SPRAY | RESPIRATORY_TRACT | Status: DC | PRN

## 2011-10-15 MED ORDER — GUAIFENESIN ER 600 MG PO TB12: 600.0000 mg | ORAL_TABLET | Freq: Two times a day (BID) | ORAL | Status: DC

## 2011-10-15 NOTE — Progress Notes (Signed)
  Subjective:    Patient ID: [name], female    DOB: 1956-02-20, 55 y.o.   MRN: [id number]  HPI Patient seen as acute visit, complaint of dry cough with associated HA and generalized body aches since 2 days ago.  Had temp 100.59F at home this morning.  Some associated dyspnea with coughing spells.  Some watery eyes and rhinorrhea. Patient has taken tylenol with minimal relief, has grandchildren around her who are ill as well.  Patient got the flu shot this year and wonders why she got sick.  She cannot take NSAIDs due to GI bleeding that occurred 7 years ago.  She has appointments for preparation for bariatric surgery (classes) beginning in March 2013; her most recent A1C is 5.6%.     Review of Systems No history of COPD or asthma per patient report.     Objective:   Physical Exam Ill appearing, but able to give full history and not in acute distress. HEENT Injected conjunctivae; watery nasal discharge without maxillary or frontal sinus tenderness. TMs clear bilaterally. No cervical adenopathy, neck is supple.  COR S1S2, no extra sounds PULM Clear bilaterally. Deep inhalation provokes cough.  No rales or wheezes heard       Assessment & Plan:

## 2011-10-15 NOTE — Assessment & Plan Note (Signed)
Patient with acute onset myalgias, headache and cough, rhinorrhea, low-grade fever that is entirely consistent with flu.  Denies diarrhea, constipation, dysuria.  Discussed supportive treatments versus antiviral (Tamiflu), which I believe is not indicated at this point (no chronic lung disease).  No NSAIDs 2/2 prior GI bleed. Supportive care.

## 2011-10-15 NOTE — Patient Instructions (Signed)
It was a pleasure to see you today.  I believe you have the flu.   I recommend that you take the Tylenol 650mg  every 4 hours around the clock for the next couple of days, then as needed.  You may use the albuterol inhaler as needed, to open your airways and improve your breathing.  No anti-inflammatories because of your history of stomach bleeding.  Guaifenesin (Mucinex) 600mg  by mouth every 12 hours to thin mucus.   Hand washing, covering mouth and nose, care when around your grandchildren.  Please call back if you are having worsening, if shortness of breath or purulent sputum, or other concerns.

## 2011-10-19 ENCOUNTER — Ambulatory Visit: Payer: Self-pay | Admitting: Family Medicine

## 2011-10-20 ENCOUNTER — Telehealth: Payer: Self-pay | Admitting: Family Medicine

## 2011-10-20 NOTE — Telephone Encounter (Signed)
Needs to speak with RN about what she should do about having her weight checked, has been following up with Gerilyn Pilgrim but had to cancel due to the flu, says shes been working with Dr. Armen Pickup so she can have surgery.

## 2011-10-21 NOTE — Telephone Encounter (Signed)
In order to be approved for bariatric surgery, pt must be seen once a month to document wt and discuss actions toward wt loss.  Pt was unable to make the appt with Dr. Gerilyn Pilgrim because of the flu and her nor [facility] has openings in December.  Appt made with Dr. Bluford Kaufmann park . [name], [name]

## 2011-10-22 ENCOUNTER — Ambulatory Visit (INDEPENDENT_AMBULATORY_CARE_PROVIDER_SITE_OTHER): Payer: Medicare PPO | Admitting: Family Medicine

## 2011-10-22 VITALS — BP 138/90

## 2011-10-22 DIAGNOSIS — IMO0002 Reserved for concepts with insufficient information to code with codable children: Secondary | ICD-10-CM

## 2011-10-22 DIAGNOSIS — M75100 Unspecified rotator cuff tear or rupture of unspecified shoulder, not specified as traumatic: Secondary | ICD-10-CM

## 2011-10-22 DIAGNOSIS — M7550 Bursitis of unspecified shoulder: Secondary | ICD-10-CM

## 2011-10-22 NOTE — Progress Notes (Signed)
  Subjective:    Patient ID: [name], female    DOB: May 03, 1956, 55 y.o.   MRN: [id number]  HPI Left shoulder pain: This patient comes in she was injected approximately 1 month ago [facility] the [facility], she is currently 60% better. She has not been compliant with the rotator cuff rehabilitation exercises. She is here for ultrasound.   Review of Systems    no fevers, chills, night sweats, weight loss. Objective:   Physical Exam  General:  Well developed, well nourished, and in no acute distress. Neuro:  Alert and oriented x3, extra-ocular muscles intact. Skin: Warm and dry. Respiratory:  Not using accessory muscles, speaking in full sentences. MSK: Left shoulder with positive Neer's, positive Hawkins, positive empty can signs. Negative speed, negative Yergason. Negative crank, negative O'Brien's. Neurovascularly intact distally.  MSK ultrasound:  Left Shoulder:   Supraspinatus:  Appears normal on long and transverse views, no bursal bulge seen with shoulder abduction on impingement view. 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 Subscapularis:  Small intratendinous gap noted. No retraction Teres Minor:  Appears normal on long and transverse views. Biceps Tendon:  Appears normal on long and transverse views, no fraying of tendon, tendon located in intertubercular groove, no subluxation with shoulder internal or external rotation.     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 As she was recently injected but not compliant with rehabilitation, I will place her in formal physical therapy. If no better can attempt an ultrasound guided aspiration of the calcific deposits as well as injection. She will see [place] back after physical therapy.

## 2011-10-22 NOTE — Patient Instructions (Signed)
Great to see you. 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 Physical therapy for 4-6 weeks. Then come back to see [place]. If no better I can break up the calcification and then re-inject.  Ihor Austin. Benjamin Stain, M.D. [facility] [address] [place] [address] [phone]

## 2011-10-28 ENCOUNTER — Ambulatory Visit (INDEPENDENT_AMBULATORY_CARE_PROVIDER_SITE_OTHER): Payer: Medicare PPO | Admitting: Family Medicine

## 2011-10-28 ENCOUNTER — Encounter: Payer: Self-pay | Admitting: Family Medicine

## 2011-10-28 DIAGNOSIS — E669 Obesity, unspecified: Secondary | ICD-10-CM

## 2011-10-28 NOTE — Patient Instructions (Signed)
Please let us know the fax number where we should be sending your bariatric evaluation notes.   Keep your follow-up appointment with Dr. Armen Pickup.   I am proud of you for getting back on track with your diet and exercise. Consider increasing your exercise to 30 minutes 5-6 times a week.

## 2011-10-28 NOTE — Assessment & Plan Note (Signed)
A: 2 pound weight gain due to holiday eating. Patient is optimistic though and getting back on track with diet and exercise. P: Continue current diet and exercise plan. Commended patient for staying positive and getting back on track. Patient is being followed by Dr. Gerilyn Pilgrim. Next appointment with bariatric program is in May. Follow-up next month for monthly evaluation.

## 2011-10-28 NOTE — Progress Notes (Signed)
  Subjective:    Patient ID: [name], female    DOB: 15-Feb-1956, 55 y.o.   MRN: [id number]  HPI [name] comes in for followup of her medically supervised weight loss trial prior to bariatric surgery.   Diet: not doing [facility] diet as much as she was doing in the past due to concerns from her cardiologist. She eats mostly boiled and baked foods. Eats about 2 servings of starches a day. Beverages include mostly water and occasional coffee.   Exercise: attends to [employer] 4 days a week for 30 minutes (works with machines and attends Zumba classes).   Mood: Feels everything is going well. She feels she had a step back after the holidays but feels optimistic about getting back on track.   Review of Systems Chronic knee pain No nausea/vomiting/diarrhea No dizziness    Objective:   Physical Exam Gen: NAD Psych: pleasant, appropriate, engaged, appears positive Pulm: no increased WOB Abd: obese, non-tender Ext: no swelling    Assessment & Plan:

## 2011-11-04 ENCOUNTER — Ambulatory Visit
Admission: RE | Admit: 2011-11-04 | Discharge: 2011-11-04 | Disposition: A | Discharge status: Home / Self Care | Payer: Medicare PPO | Source: Ambulatory Visit | Attending: Family Medicine | Admitting: Family Medicine

## 2011-11-04 ENCOUNTER — Ambulatory Visit: Payer: Self-pay | Admitting: Family Medicine

## 2011-11-04 ENCOUNTER — Other Ambulatory Visit: Payer: Self-pay | Admitting: Family Medicine

## 2011-11-04 DIAGNOSIS — Z1231 Encounter for screening mammogram for malignant neoplasm of breast: Secondary | ICD-10-CM

## 2011-11-04 MED ORDER — DOCUSATE SODIUM 100 MG PO CAPS: 100.0000 mg | ORAL_CAPSULE | Freq: Two times a day (BID) | ORAL | Status: AC | PRN

## 2011-11-08 ENCOUNTER — Ambulatory Visit: Payer: Medicare PPO | Admitting: Rehabilitative and Restorative Service Providers"

## 2011-11-10 ENCOUNTER — Ambulatory Visit (INDEPENDENT_AMBULATORY_CARE_PROVIDER_SITE_OTHER): Payer: Medicare PPO | Admitting: Family Medicine

## 2011-11-10 ENCOUNTER — Encounter: Payer: Self-pay | Admitting: Family Medicine

## 2011-11-10 VITALS — BP 118/81 | HR 89 | Temp 98.6°F | Ht 66.0 in | Wt 292.0 lb

## 2011-11-10 DIAGNOSIS — E669 Obesity, unspecified: Secondary | ICD-10-CM

## 2011-11-10 DIAGNOSIS — E1169 Type 2 diabetes mellitus with other specified complication: Secondary | ICD-10-CM

## 2011-11-10 DIAGNOSIS — R300 Dysuria: Secondary | ICD-10-CM

## 2011-11-10 DIAGNOSIS — N949 Unspecified condition associated with female genital organs and menstrual cycle: Secondary | ICD-10-CM

## 2011-11-10 DIAGNOSIS — N390 Urinary tract infection, site not specified: Secondary | ICD-10-CM

## 2011-11-10 DIAGNOSIS — R109 Unspecified abdominal pain: Secondary | ICD-10-CM

## 2011-11-10 DIAGNOSIS — N898 Other specified noninflammatory disorders of vagina: Secondary | ICD-10-CM

## 2011-11-10 LAB — POCT URINALYSIS DIPSTICK
Bilirubin, UA: NEGATIVE
Blood, UA: NEGATIVE
Glucose, UA: NEGATIVE
Ketones, UA: NEGATIVE
Nitrite, UA: NEGATIVE
Protein, UA: NEGATIVE
Spec Grav, UA: 1.01
Urobilinogen, UA: 0.2
pH, UA: 6

## 2011-11-10 LAB — POCT UA - MICROSCOPIC ONLY

## 2011-11-10 MED ORDER — PHENAZOPYRIDINE HCL 200 MG PO TABS: 200.0000 mg | ORAL_TABLET | Freq: Three times a day (TID) | ORAL | Status: AC | PRN

## 2011-11-10 MED ORDER — CIPROFLOXACIN HCL 500 MG PO TABS: 500.0000 mg | ORAL_TABLET | Freq: Two times a day (BID) | ORAL | Status: AC

## 2011-11-10 NOTE — Progress Notes (Signed)
Subjective:     Patient ID: [name], female   DOB: 02-13-1956, 56 y.o.   MRN: [id number]  HPI 56 year-old female presents to discuss abdominal pain and dysuria. She's also here to followup obesity.  1. Abdominal pain: Patient reports lower abdominal pain is present now for the last week. She states that the pain occurs mostly at night with meals. She describes sharp bilateral lower abdominal pain that is non radiating, and is usually about 5/10 in severity. Associated symptoms include intermittent nonbloody diarrhea, worsening heartburn, increased urinary frequency, and dysuria. Patient denies nausea, vomiting, fever. She's been compliant with her Prilosec. She denies history of recurrent UTIs.  2. Vaginal odor: Patient reports odor which he can only describe as rotting tissue per vagina for the last month. She admits to douching with warm water and vinegar. She denies vaginal bleeding, discharge,  itching or irritation. She has a history of a laparoscopic hysterectomy in the 70s. She states that her ovaries remain. She is unsure about her cervix. She denies fever, chills, weight loss.   3. Obesity: Patient's weight is 292 pounds today. She worsens she did take a break from her diet her daughters. She is compliant with exercise regimen. She is following up with a nutritionist  Wyona Almas.    Review of Systems  pertinent review of systems as per history of present illness    Objective:   Physical Exam Filed Vitals:   11/10/11 1411  BP: 118/81  Pulse: 89  Temp: 98.6 F (37 C)     General: Obese female in no acute distress. Mouth: Moist membranes. Abdomen: Obese abdomen. No obvious masses. Normoactive bowel sounds. Negative Murphy sign. No pain over McBurney point. Mild tenderness to palpation in the epigastric gastric and periumbilical areas. No rebound tenderness. No guarding. Back: No CVA tenderness. Skin: Warm and dry.    Laboratory data:  Urinalysis suggestive of possible  infection with moderate leukocyte esterase, 2+ bacteria, 1-5 Slinker blood cells and occasional red blood cells.  Assessment:     Suspect UTI she assessment plan and problem list.    Plan:

## 2011-11-10 NOTE — Assessment & Plan Note (Addendum)
A: Suspect UTI as source of abdominal pain. Will also check for concomitant pancreas involvement as well as gallbladder involvement given the temporal relationship of symptoms with food intake. Although the cecum he does have an exacerbation of her gastritis as she admits to worsening reflux.  P:  - Cipro x5 days - Pyridium x3 days when necessary dysuria  -Send urine out for culture and sensitivities  -Check CMET (LFTs) CBC with diff and lipase

## 2011-11-10 NOTE — Assessment & Plan Note (Signed)
Unclear the etiology. No red flags to suggest infection. Plan have patient followup in 2 four-week for pelvic exam. In the meantime recommend discontinuation of douching.

## 2011-11-10 NOTE — Patient Instructions (Addendum)
-   Continue to eat at least 3 meals and 1-2 snacks per day.   - Continue to do [employer] workouts 4 X wk AND Zumba 3 X wk, but ALSO:  Look for and take advantage EXERCISE OPPORTUNITIES throughout your diet.  For example, when you watch TV, stand up during the ads, and walk in place.   - LACK OF SLEEP IS ASSOCIATED WITH DIFFICULTY LOSING WEIGHT.   GOOGLE THIS SUBJECT:  Sleep deprivation and weight management; also Google SLEEP HYGIENE.   - With bariatric surgery, you'll need to eat small, frequent meals, so get into the pattern now of not going more than 5 hours without eating something.   - Limit sodium intake to 1500 mg/day.   - Pay attention to the fat in the foods you choose.  Aim for no more than 40 grams of fat per day.  (Read labels.)    - Each ounce of most beef and pork has 5 grams of fat per ounce.  Also an egg yolk has 5 grams of fat, and so does 1 tsp of butter or oil.    - Use low-fat condiments, like mayonnaise and salad dressing.   - Obtain twice as many veg's as protein or carbohydrate foods for both lunch and dinner.  Fresh or frozen veg's are best.  Use your microwave for veg's.   - Limit your starchy foods to 2 per meal.  This includes bread and all bakery products, crackers, chips, potatoes, pasta, rice, corn, (and beans).    - ONE starchy food portion = the equivalent of one slice of bread or 1 oz of a starchy snack food (chips, pretzels, crackers) or 1/2 cup of a scoopable starch food (like potatoes, rice, pasta) or 15 grams of carbohydrate on the label.

## 2011-11-10 NOTE — Assessment & Plan Note (Addendum)
A: 1 pound weight gain since last visit. Patient recently restarted diet and exercise regimen following break from holidays. Patient has been evaluated by a nutritionist Wyona Almas.  Max weight: 305 Current weight: 292 Net: - 13   P: Continue current regimen. Encouraged renewed commitment to diet and exercise regimen. Followup in one month.

## 2011-11-10 NOTE — Progress Notes (Signed)
Medical Nutrition Therapy:  Appt start time: 1130 end time:  1230.  Assessment:  Primary concerns today: Weight management and Blood sugar control.  Edwardine is a bariatric surgery candidate at [employer].  She sees the bariatric surgeon on Mar 6, their RD on Mar 11, then the exercise person in April.  Usual eating pattern includes 3 meals and 2 snacks (protein shake or peanut butter crackers or popcorn) per day.  Zamantha is usually up every 2 hours in the night, using the bathroom.  She estimates she gets ~5 hours of sleep a night.   Everyday foods include water, diet sweet tea, 8-12 oz soda, veg's.  Avoided foods include cooked spinach.   24-hr recall: B (up ~8 AM) (10 AM)- 1 c grits, 3 slices spam, 1 fried egg (olive oil), 1 slice whwht toast, coffee w/ swtnr & 1 tbsp Hershey's chocolate creamer; L (1:30 PM)- 4 saltines w/ 4 tbsp peanut butter, water; D (6 PM)- 6 oz T-bone steak, 1 c brown rice, 1 c canned creamed corn, 12 oz lemonade, water; Snk (10 PM)- handful of chips, water.  Yesterday's intake was atypical in that she only had one snack.   Usual physical activity includes 30 min [employer] workouts 4 X wk and 20-30 min Zumba tape 3 X wk at home.  She will be starting water aerobics at [employer].   Meredith was surprisingly unaware of the sources of fat and CHO in her diet.  We reviewed CHO foods, and discussed limiting intake.    Progress Towards Goal(s):  In progress.   Nutritional Diagnosis:  NI-5.6.2 Excessive fat intake As related to lack of fat awareness and tracking.  As evidenced by >80 grams of dietary fat yesterday.    Intervention:  Nutrition education.  Monitoring/Evaluation:  Dietary intake, exercise, and body weight in 1 month.

## 2011-11-10 NOTE — Patient Instructions (Signed)
[name],  Thank you for coming in today.  Abdominal pain: appears to be a UTI. Please take the antibiotic and the pyridium as needed for pain. Will call you with the results of your blood work and let you know if we have to change the treatment based on your culture. Vaginal odor: stop douching. F/u with me in 2-4 weeks for pelvic exam. Reflux: low fat diet, small meals, may increase prilosec (dbl dose for a few days).   Call if symptoms worsen   -Dr. Armen Pickup

## 2011-11-11 ENCOUNTER — Telehealth: Payer: Self-pay | Admitting: Family Medicine

## 2011-11-11 LAB — COMPREHENSIVE METABOLIC PANEL
ALT: 17 U/L (ref 0–35)
AST: 12 U/L (ref 0–37)
Albumin: 4 g/dL (ref 3.5–5.2)
Alkaline Phosphatase: 81 U/L (ref 39–117)
BUN: 16 mg/dL (ref 6–23)
CO2: 24 mEq/L (ref 19–32)
Calcium: 9.7 mg/dL (ref 8.4–10.5)
Chloride: 104 mEq/L (ref 96–112)
Creat: 0.88 mg/dL (ref 0.50–1.10)
Glucose, Bld: 124 mg/dL — ABNORMAL HIGH (ref 70–99)
Potassium: 3.8 mEq/L (ref 3.5–5.3)
Sodium: 141 mEq/L (ref 135–145)
Total Bilirubin: 0.3 mg/dL (ref 0.3–1.2)
Total Protein: 6.6 g/dL (ref 6.0–8.3)

## 2011-11-11 LAB — CBC
HCT: 37.9 % (ref 36.0–46.0)
Hemoglobin: 11.9 g/dL — ABNORMAL LOW (ref 12.0–15.0)
MCH: 27.9 pg (ref 26.0–34.0)
MCHC: 31.4 g/dL (ref 30.0–36.0)
MCV: 88.8 fL (ref 78.0–100.0)
Platelets: 268 10*3/uL (ref 150–400)
RBC: 4.27 MIL/uL (ref 3.87–5.11)
RDW: 16.2 % — ABNORMAL HIGH (ref 11.5–15.5)
WBC: 7.7 10*3/uL (ref 4.0–10.5)

## 2011-11-11 LAB — LIPASE: Lipase: 32 U/L (ref 0–75)

## 2011-11-11 NOTE — Telephone Encounter (Signed)
Called patient at home to let her know that her CMP, Lipase and CBC were wnl. She states that she is feeling a bit better. Advised pt to finish course of antibiotics and I will call her if there needs to be a change in therapy based on UCx and sensitivity.

## 2011-11-13 LAB — CULTURE, URINE COMPREHENSIVE

## 2011-11-15 ENCOUNTER — Telehealth: Payer: Self-pay | Admitting: Family Medicine

## 2011-11-15 NOTE — Telephone Encounter (Signed)
Called patient at home. Gave results of urine culture. She is no longer having dysuria. No having low back and R side pain. Pain is mild and "bareable". Pt to see me on 12/01/11. Pt states that her sister's cough has improved.

## 2011-11-16 ENCOUNTER — Ambulatory Visit: Payer: Medicare PPO | Attending: Sports Medicine

## 2011-11-16 DIAGNOSIS — M25559 Pain in unspecified hip: Secondary | ICD-10-CM | POA: Insufficient documentation

## 2011-11-16 DIAGNOSIS — M545 Low back pain, unspecified: Secondary | ICD-10-CM | POA: Insufficient documentation

## 2011-11-16 DIAGNOSIS — IMO0001 Reserved for inherently not codable concepts without codable children: Secondary | ICD-10-CM | POA: Insufficient documentation

## 2011-11-16 DIAGNOSIS — M25659 Stiffness of unspecified hip, not elsewhere classified: Secondary | ICD-10-CM | POA: Insufficient documentation

## 2011-11-17 ENCOUNTER — Telehealth: Payer: Self-pay | Admitting: Family Medicine

## 2011-11-17 NOTE — Telephone Encounter (Signed)
Called pt at home. Normal mammogram, birad 1. Routine f/u in 1 year. Pt voiced understanding and had no questions.

## 2011-11-17 NOTE — Telephone Encounter (Signed)
Message copied by Dessa Phi on Wed Nov 17, 2011  5:34 PM ------      Message from: Zachery Dauer      Created: Tue Nov 16, 2011  6:13 PM                   ----- Message -----         From: Rad Results In Interface         Sent: 11/16/2011   2:59 PM           To: Tobin Chad

## 2011-11-18 ENCOUNTER — Ambulatory Visit: Payer: Medicare PPO

## 2011-11-22 ENCOUNTER — Encounter: Payer: Medicare PPO | Admitting: Physical Therapy

## 2011-11-25 ENCOUNTER — Ambulatory Visit: Payer: Medicare PPO | Admitting: Physical Therapy

## 2011-11-30 ENCOUNTER — Ambulatory Visit: Payer: Medicare PPO | Admitting: Physical Therapy

## 2011-12-01 ENCOUNTER — Ambulatory Visit (INDEPENDENT_AMBULATORY_CARE_PROVIDER_SITE_OTHER): Payer: Medicare PPO | Admitting: Family Medicine

## 2011-12-01 ENCOUNTER — Encounter: Payer: Self-pay | Admitting: Family Medicine

## 2011-12-01 ENCOUNTER — Other Ambulatory Visit (HOSPITAL_COMMUNITY)
Admission: RE | Admit: 2011-12-01 | Discharge: 2011-12-01 | Disposition: A | Discharge status: Home / Self Care | Payer: Medicare PPO | Source: Ambulatory Visit | Attending: Family Medicine | Admitting: Family Medicine

## 2011-12-01 VITALS — BP 129/84 | HR 88 | Temp 97.9°F | Ht 66.0 in | Wt 297.4 lb

## 2011-12-01 DIAGNOSIS — E1169 Type 2 diabetes mellitus with other specified complication: Secondary | ICD-10-CM

## 2011-12-01 DIAGNOSIS — M25561 Pain in right knee: Secondary | ICD-10-CM

## 2011-12-01 DIAGNOSIS — Z1239 Encounter for other screening for malignant neoplasm of breast: Secondary | ICD-10-CM

## 2011-12-01 DIAGNOSIS — Z79899 Other long term (current) drug therapy: Secondary | ICD-10-CM

## 2011-12-01 DIAGNOSIS — Z124 Encounter for screening for malignant neoplasm of cervix: Secondary | ICD-10-CM

## 2011-12-01 DIAGNOSIS — M25569 Pain in unspecified knee: Secondary | ICD-10-CM

## 2011-12-01 DIAGNOSIS — I10 Essential (primary) hypertension: Secondary | ICD-10-CM

## 2011-12-01 DIAGNOSIS — Z01419 Encounter for gynecological examination (general) (routine) without abnormal findings: Secondary | ICD-10-CM | POA: Insufficient documentation

## 2011-12-01 LAB — POCT GLYCOSYLATED HEMOGLOBIN (HGB A1C): Hemoglobin A1C: 6.3

## 2011-12-01 MED ORDER — TRAMADOL HCL 50 MG PO TABS: 50.0000 mg | ORAL_TABLET | Freq: Three times a day (TID) | ORAL | Status: DC | PRN

## 2011-12-01 MED ORDER — LISINOPRIL 10 MG PO TABS: 10.0000 mg | ORAL_TABLET | Freq: Every day | ORAL | Status: DC

## 2011-12-01 NOTE — Assessment & Plan Note (Signed)
A: Bilateral pain worse with weight gain. 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 P: -Weight loss: discussed with patient the benefits of weight loss patient agrees that when she weighs less her knees hurt less. -Tramadol -Called over to Delbert Harness to see patient could get back in for followup consultation. 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 She will have to pay the entire [place] before she can be seen for followup consultation without exception.

## 2011-12-01 NOTE — Assessment & Plan Note (Signed)
A: no cervix appreciated on physical exam. P: f/u pap. If negative for transition cells, will not repeat pap.

## 2011-12-01 NOTE — Progress Notes (Deleted)
  Subjective:    Patient ID: [name], female    DOB: Nov 09, 1955, 56 y.o.   MRN: [id number]  HPI    Review of Systems  ROS     Objective:   Physical Exam        Assessment & Plan:

## 2011-12-01 NOTE — Assessment & Plan Note (Signed)
A: Declined with elevated A1c. Patient remains diet controlled. Reported CBGs are within goal. P: -Stricter diet control -Increase exercise -Followup A1c in 3 months  - will start low-dose lisinopril with followup treatment in one week for renal protection

## 2011-12-01 NOTE — Progress Notes (Signed)
Subjective:     Patient ID: [name], female   DOB: 04/11/1956, 56 y.o.   MRN: [id number]  HPI Ms. Hertzberg presents for a physical and a Pap smear. She had a hysterectomy in 1977 and is not sure if she still has a cervix. She is physically active with a long-term partner. She does not use condoms. She denies vaginal discharge, vaginal bleeding, vulvar itching or irritation, or pain with intercourse.  Regarding her obesity and weight loss: She's gained weight over the past few weeks she reports increased stress with family. As she is preparing to start a cleansing diet that she saw on Dr. Neil Crouch to kick start her weight loss. She reports worsening bilateral knee pain since she's gained weight. She had her knees replaced 10 years ago. She has an outstanding balance of $300 at her orthopedics surgeon's office and has been unable to schedule followup. She reports that she is actively paying off the balance.  Regarding her diabetes. The patient associates her CBGs. She states that she has no postprandial CBGs greater than 200. Her fasting CBGs are usually 90 to 130. She denies symptomatic low blood sugars.   Review of Systems Constitutional: Negative.   HENT: Negative for hearing loss, ear pain, nosebleeds, congestion, sore throat, neck pain, tinnitus and ear discharge.   Eyes: Positive for blurred vision. Negative for double vision, photophobia, pain, discharge and redness.  Respiratory: Negative.  Negative for stridor.   Cardiovascular: Positive for claudication and leg swelling. Negative for chest pain, palpitations, orthopnea and PND.  Gastrointestinal: Positive for heartburn and abdominal pain. Negative for nausea, vomiting, constipation, blood in stool and melena.  Genitourinary: Positive for urgency and frequency. Negative for dysuria, hematuria and flank pain.  Musculoskeletal: Positive for myalgias, back pain and joint pain. Negative for falls.  Skin: Negative.   Neurological: Positive for  headaches. Negative for dizziness, tingling, tremors, sensory change, speech change, focal weakness, seizures and loss of consciousness.  Hematological: Negative.   Psychiatric/Behavioral: Negative.       Objective:   Physical Exam BP 129/84  Pulse 88  Temp(Src) 97.9 F (36.6 C) (Oral)  Ht 5\' 6"  (1.676 m)  Wt 297 lb 6.4 oz (134.9 kg)  BMI 48.00 kg/m2 General appearance: alert, cooperative, no distress and morbidly obese Lungs: clear to auscultation bilaterally Heart: regular rate and rhythm, S1, S2 normal, no murmur, click, rub or gallop Pelvic: external genitalia normal and vagina with thick Ebbs curdlike discharge. Mucosa normal. No cervix identified. Evidence of surgical manipulation. No cervix appreciated on bimanual exam. specimen of vaginal pouch obtained to check for transition zone cells.  Extremities: edema trace pitting. Surgical scars on bilateral knees.  Skin: Skin color, texture, turgor normal. No rashes or lesions Neurologic: Grossly normal    Assessment:         Plan:

## 2011-12-01 NOTE — Patient Instructions (Signed)
Ms. Estrin,   Thank you for coming in today.  Please try the tramadol for pain. Start lisinopril and come back in 1 week for a blood draw. I will contact. Dr. Cristino Martes office about getting you back in for knee evaluation.  Continue to watch your diet and exercise.   -Dr. Armen Pickup

## 2011-12-03 ENCOUNTER — Encounter: Payer: Self-pay | Admitting: Family Medicine

## 2011-12-03 ENCOUNTER — Encounter: Payer: Medicare PPO | Admitting: Physical Therapy

## 2011-12-03 ENCOUNTER — Telehealth: Payer: Self-pay | Admitting: Family Medicine

## 2011-12-03 MED ORDER — FLUTICASONE PROPIONATE 50 MCG/ACT NA SUSP: 2.0000 | Freq: Every day | NASAL | Status: DC

## 2011-12-03 NOTE — Telephone Encounter (Signed)
Called patient and let her know that pap was negative and since there was no cervix on exam we will never have to repeat pap. If she has a GU complaint I will definitely examine her.   Pt request flonase refill, will refill.

## 2011-12-07 ENCOUNTER — Ambulatory Visit: Payer: Medicare PPO | Attending: Sports Medicine | Admitting: Physical Therapy

## 2011-12-07 DIAGNOSIS — M25559 Pain in unspecified hip: Secondary | ICD-10-CM | POA: Insufficient documentation

## 2011-12-07 DIAGNOSIS — M545 Low back pain, unspecified: Secondary | ICD-10-CM | POA: Insufficient documentation

## 2011-12-07 DIAGNOSIS — M25659 Stiffness of unspecified hip, not elsewhere classified: Secondary | ICD-10-CM | POA: Insufficient documentation

## 2011-12-07 DIAGNOSIS — IMO0001 Reserved for inherently not codable concepts without codable children: Secondary | ICD-10-CM | POA: Insufficient documentation

## 2011-12-09 ENCOUNTER — Ambulatory Visit: Payer: Medicare PPO | Admitting: Family Medicine

## 2011-12-09 ENCOUNTER — Ambulatory Visit: Payer: Medicare PPO | Admitting: Physical Therapy

## 2011-12-15 ENCOUNTER — Ambulatory Visit: Payer: Medicare PPO | Admitting: Family Medicine

## 2011-12-15 ENCOUNTER — Encounter: Payer: Self-pay | Admitting: Family Medicine

## 2011-12-15 ENCOUNTER — Ambulatory Visit (INDEPENDENT_AMBULATORY_CARE_PROVIDER_SITE_OTHER): Payer: Medicare PPO | Admitting: Family Medicine

## 2011-12-15 DIAGNOSIS — I10 Essential (primary) hypertension: Secondary | ICD-10-CM

## 2011-12-15 DIAGNOSIS — G4700 Insomnia, unspecified: Secondary | ICD-10-CM

## 2011-12-15 DIAGNOSIS — E669 Obesity, unspecified: Secondary | ICD-10-CM

## 2011-12-15 MED ORDER — ZOLPIDEM TARTRATE 10 MG PO TABS: 10.0000 mg | ORAL_TABLET | Freq: Every evening | ORAL | Status: DC | PRN

## 2011-12-15 NOTE — Assessment & Plan Note (Signed)
A: declined. Patient identifies stress. P:  -encourage diet and exercise.

## 2011-12-15 NOTE — Assessment & Plan Note (Addendum)
A: BP well controlled. Normal  exam. Meds: compliant. P: -continue current management. -encourage diet and exercise.

## 2011-12-15 NOTE — Assessment & Plan Note (Signed)
A: insomnia. Due to stress. P: restart ambien. Discussed sleep hygiene. Increase exercise.

## 2011-12-15 NOTE — Progress Notes (Signed)
Subjective:     Patient ID: [name], female   DOB: 10/16/1956, 56 y.o.   MRN: [id number]  HPI 56 yo F presents to discuss the following:  1. Insomnia: recent stress with sister and brother who had colectomy, ostomy and is now in a nursing home for rehab. She reports going to bed at 10 an waking up at 4 AM. Or going to bed at 1/2 AM and waking up at 7 AM. She use to get 8 hrs of sleep. Associated symptoms include fatigue and headaches. She denies fever, chills.   2. HTN: compliant with medications. No CP, SOB, or LE edema.  3. Obesity: gaining weight. Feel off exercise program and started eating carbs.  She plans to restart curves 3x/wk and dance classes at [employer] 2x/week.   Review of Systems As per HPI    Objective:   Physical Exam BP 121/79  Pulse 75  Temp(Src) 98 F (36.7 C) (Oral)  Ht 5\' 6"  (1.676 m)  Wt 298 lb (135.172 kg)  BMI 48.10 kg/m2 General appearance: alert, cooperative and no distress Neck: no adenopathy, no carotid bruit, no JVD and supple, symmetrical, trachea midline Lungs: clear to auscultation bilaterally Heart: regular rate and rhythm, S1, S2 normal, no murmur, click, rub or gallop Extremities: extremities normal, atraumatic, no cyanosis or edema     Assessment:         Plan:

## 2011-12-15 NOTE — Patient Instructions (Signed)
Ms. Tappan,  Thank you for coming to see me today. Take the Ambien for sleep one hour before bedtime. (10 PM).   I am excited that you have some fun exercise options, enjoy!  Dr. Armen Pickup

## 2011-12-28 ENCOUNTER — Ambulatory Visit (INDEPENDENT_AMBULATORY_CARE_PROVIDER_SITE_OTHER): Payer: Medicare PPO | Admitting: Family Medicine

## 2011-12-28 ENCOUNTER — Encounter: Payer: Self-pay | Admitting: Family Medicine

## 2011-12-28 DIAGNOSIS — E669 Obesity, unspecified: Secondary | ICD-10-CM

## 2011-12-28 DIAGNOSIS — E1169 Type 2 diabetes mellitus with other specified complication: Secondary | ICD-10-CM

## 2011-12-28 NOTE — Progress Notes (Signed)
Medical Nutrition Therapy:  Appt start time: 1500 end time:  1600.  Assessment:  Primary concerns today: Weight management and Blood sugar control.  [name] has been under a lot of stress recently, helping her brother who has been experiencing health problems.  She said her food choices have frequently been on the fly, and less than ideal.  24-hr recall: B (10 AM)- 1 breaded fried chicken breast, 1 slc bread, coffee w/ Splenda, 1 tbsp cream; 5-6 PM- [employer] dance work out; D (7 PM)- meat loaf, 1 c mac&cheese, water.  Atypical yesterday b/c breakfast is usually earlier; usually eats lunch/snack mid-day; dinner is usually by 6 PM.  [name] said she has usually been getting veg's 1-2 X day.  She is going to dance class 3 X wk, and to [employer]ves 2 x wk.    Progress Towards Goal(s):  In progress.   Nutritional Diagnosis:  NI-5.6.2 Excessive fat intake As related to lack of fat awareness and tracking.  As evidenced by >60 grams of dietary fat yesterday.    Intervention:  Nutrition education.  Monitoring/Evaluation:  Dietary intake, exercise, and body weight in 1 month.

## 2011-12-28 NOTE — Patient Instructions (Signed)
-   If you want to be successful in losing weight, you need to be DELIBERATE about your choices.    DELIBERATE:    1. characterized by or resulting from careful and thorough consideration    2. characterized by awareness of the consequences   3. SET YOUR MIND ON WHAT YOU WANT TO TO DO - When faced with a difficult food decision, PRESS THE PAUSE BUTTON, and ask yourself:  What's happening right now?  Why am I leaning toward this food vs. that food? - 3 Questions of a good food decision:  1. How hungry am I?  2. What am I in the mood for?  3. What's good for me? - Food goals:    1. Veg's and/or fruit with every meal.   2. .Eat at least 3 meals and 1-2 snacks per day.  Aim for no more than 5 hours between eating. - Continue dance 3 X wk, and get back to Curves 4 X wk.

## 2012-01-05 DIAGNOSIS — I10 Essential (primary) hypertension: Secondary | ICD-10-CM | POA: Insufficient documentation

## 2012-01-05 DIAGNOSIS — I152 Hypertension secondary to endocrine disorders: Secondary | ICD-10-CM

## 2012-01-05 DIAGNOSIS — E1159 Type 2 diabetes mellitus with other circulatory complications: Secondary | ICD-10-CM | POA: Insufficient documentation

## 2012-01-05 HISTORY — DX: Hypertension secondary to endocrine disorders: I15.2

## 2012-01-05 HISTORY — DX: Type 2 diabetes mellitus with other circulatory complications: E11.59

## 2012-01-07 ENCOUNTER — Telehealth: Payer: Self-pay | Admitting: Family Medicine

## 2012-01-07 NOTE — Telephone Encounter (Signed)
Please call patient to confirm information regarding patient's weight was sent over to [facility]

## 2012-01-10 ENCOUNTER — Ambulatory Visit: Payer: Medicare PPO | Admitting: Family Medicine

## 2012-01-11 ENCOUNTER — Telehealth: Payer: Self-pay | Admitting: Family Medicine

## 2012-01-11 NOTE — Telephone Encounter (Signed)
Will forward to PCP, do you know anything about this?

## 2012-01-11 NOTE — Telephone Encounter (Signed)
Patient is calling about the paperwork that she needs sent to [facility] for the surgery she is supposed to be having.  [facility] says they haven't received them yet.

## 2012-01-12 NOTE — Telephone Encounter (Signed)
Called back. Left VM asking for more info. I am aware that she is in a weight loss program through [facility]. I am not sure what information she needs sent (office notes, weights, etc?). Asked patient to call back with exact details and who to send the information to (name, phone number, fax number).

## 2012-01-25 ENCOUNTER — Ambulatory Visit: Payer: Medicare PPO | Admitting: Family Medicine

## 2012-01-26 ENCOUNTER — Ambulatory Visit (INDEPENDENT_AMBULATORY_CARE_PROVIDER_SITE_OTHER): Payer: Medicare PPO | Admitting: Family Medicine

## 2012-01-26 ENCOUNTER — Encounter: Payer: Self-pay | Admitting: Family Medicine

## 2012-01-26 VITALS — BP 130/90 | HR 79 | Temp 97.8°F | Ht 66.0 in | Wt 296.0 lb

## 2012-01-26 DIAGNOSIS — E669 Obesity, unspecified: Secondary | ICD-10-CM

## 2012-01-26 DIAGNOSIS — I10 Essential (primary) hypertension: Secondary | ICD-10-CM

## 2012-01-26 LAB — LDL CHOLESTEROL, DIRECT: Direct LDL: 91 mg/dL

## 2012-01-26 MED ORDER — CARVEDILOL 12.5 MG PO TABS: 12.5000 mg | ORAL_TABLET | Freq: Two times a day (BID) | ORAL | Status: DC

## 2012-01-26 MED ORDER — POLYETHYLENE GLYCOL 3350 17 G PO PACK: 17.0000 g | PACK | Freq: Two times a day (BID) | ORAL | Status: DC

## 2012-01-26 MED ORDER — LISINOPRIL 10 MG PO TABS: 10.0000 mg | ORAL_TABLET | Freq: Every day | ORAL | Status: DC

## 2012-01-26 MED ORDER — DILTIAZEM HCL 30 MG PO TABS: 30.0000 mg | ORAL_TABLET | Freq: Two times a day (BID) | ORAL | Status: DC

## 2012-01-26 MED ORDER — SPIRONOLACTONE 50 MG PO TABS: 25.0000 mg | ORAL_TABLET | Freq: Every day | ORAL | Status: DC

## 2012-01-26 MED ORDER — CLONIDINE HCL 0.2 MG PO TABS: 0.2000 mg | ORAL_TABLET | Freq: Two times a day (BID) | ORAL | Status: DC

## 2012-01-26 NOTE — Progress Notes (Signed)
  Subjective:     Patient ID: [name], female   DOB: [birth year], 56 y.o.   MRN: [id number]  HPI 56 yo F presents for f/u and to discuss the following:   1. Obesity/weight loss: patient is exercising. She is down 5 lbs since our last office visit. She is still undergoing regular f/u at [facility] with a plan to have gastric bypass at the end of April/early May.  She had a second stress test performed at [facility] since our last visit. She reports some chest pain during this test. She admits to not eating fast food. She cooks at home and avoids salt. She reports an improvement in her bilateral knee pain. She denies HA, chest pain, SOB.   2. HTN: taking all medications as prescribed. Took AM medications today. ROS as per above. She is not smoking.   Review of Systems As per HPI    Objective:   Physical Exam BP 130/90  Pulse 79  Temp(Src) 97.8 F (36.6 C) (Oral)  Ht 5\' 6"  (1.676 m)  Wt 296 lb (134.265 kg)  BMI 47.78 kg/m2 BP Readings from Last 3 Encounters:  01/26/12 130/90  12/15/11 121/79  12/01/11 129/84   General appearance: alert, cooperative, no distress and morbidly obese Neck: no adenopathy, no carotid bruit, no JVD and supple, symmetrical, trachea midline Lungs: clear to auscultation bilaterally Heart: regular rate and rhythm, S1, S2 normal, no murmur, click, rub or gallop Extremities: edema trace to 1+ bilateral LE edema.      Assessment:      Plan:

## 2012-01-26 NOTE — Assessment & Plan Note (Signed)
A: improved. patient motivated to lose weight and still desire bariatric surgery,  P:  -check direct LDL. -patient to f/u with me in 2 weeks.  -will send records to [facility]. Patient to call with name and fax number of person to send records to. She needs weights, BMI and office notes.

## 2012-01-26 NOTE — Patient Instructions (Addendum)
Sevanna,  Thank you for coming in to see me today. I will call or send a letter with the results of your blood work. Your repeat blood pressure of 130/90 is ok. I would like the bottom number below 80. Watch your salt  and continue to exercise to get that down.    Congratulations on your weight loss! Please keep up the great work. F/u in 1 mos for BP check.  Dr. Armen Pickup

## 2012-01-26 NOTE — Assessment & Plan Note (Signed)
BP initially elevated on automatic check to 158/95 then down to 130/90 on manual recheck. Meds compliant no side effects.  Plan continue current regimen. F/u in 1 month. Goal is to remove BP medication with continued weight loss.

## 2012-01-26 NOTE — Telephone Encounter (Signed)
Patient calling back to leave fax number to [facility].  It's [phone]  Attn: Inetta Fermo.  You can call her at [phone] if needed.

## 2012-01-27 ENCOUNTER — Encounter: Payer: Self-pay | Admitting: Family Medicine

## 2012-01-27 NOTE — Telephone Encounter (Signed)
Left VM asking to have info called/faxed back regarding how information should be sent to [facility]. I will wait to have details before I send info (i.e. Do they need office notes or just weights/BMI)?

## 2012-01-31 ENCOUNTER — Telehealth: Payer: Self-pay | Admitting: *Deleted

## 2012-01-31 ENCOUNTER — Other Ambulatory Visit: Payer: Self-pay | Admitting: Family Medicine

## 2012-01-31 NOTE — Telephone Encounter (Signed)
Called [place] again. Left VM requesting a call back in regards to what info is needed to facilitate Ms. Ayllon's weight loss surgery.

## 2012-01-31 NOTE — Telephone Encounter (Signed)
Tina with [facility] calling.  Patient's insurance requires a consecutive 6 month Physician Supervised Weight Loss  Program that discusses a calorie diet plan, exercise plan and behavior modification plan.  I see where patient has seen Wyona Almas in January and February but cancelled [facility] appointment.  Inetta Fermo is going to fax [place] a form with this information on it.   Will forward to Dr. Armen Pickup.  Ileana Ladd

## 2012-02-02 NOTE — Telephone Encounter (Signed)
Please print and fax all office notes from 09/27/11 on in which obesity/weight loss was addressed to Michiel Sites at [phone]

## 2012-02-02 NOTE — Telephone Encounter (Signed)
Inetta Fermo from [facility] called back on Tuesday 4/2 to say she had some office notes on Ms. Zetina but was missing a few months.  Office notes she needed for  consecutive 6 month office visits faxed to her.  Ileana Ladd

## 2012-02-04 NOTE — Telephone Encounter (Signed)
Called patient. I received fax from [facility] regarding appropriate documentation for physician supervised weight loss plan. Will scan documentation and amend notes as needed to reflect what we spoke about.

## 2012-02-09 ENCOUNTER — Ambulatory Visit (INDEPENDENT_AMBULATORY_CARE_PROVIDER_SITE_OTHER): Payer: Medicare PPO | Admitting: Family Medicine

## 2012-02-09 ENCOUNTER — Encounter: Payer: Self-pay | Admitting: Family Medicine

## 2012-02-09 ENCOUNTER — Telehealth: Payer: Self-pay | Admitting: Family Medicine

## 2012-02-09 VITALS — BP 95/67 | HR 80 | Temp 98.5°F | Ht 66.0 in | Wt 292.0 lb

## 2012-02-09 DIAGNOSIS — A048 Other specified bacterial intestinal infections: Secondary | ICD-10-CM | POA: Insufficient documentation

## 2012-02-09 DIAGNOSIS — I10 Essential (primary) hypertension: Secondary | ICD-10-CM

## 2012-02-09 DIAGNOSIS — E669 Obesity, unspecified: Secondary | ICD-10-CM

## 2012-02-09 MED ORDER — CARVEDILOL 12.5 MG PO TABS: 12.5000 mg | ORAL_TABLET | Freq: Two times a day (BID) | ORAL | Status: DC

## 2012-02-09 NOTE — Assessment & Plan Note (Signed)
Prevpac x 30 days. Patient advised to hold nexium while taking prevpac.

## 2012-02-09 NOTE — Progress Notes (Signed)
Subjective:     Patient ID: [name], female   DOB: 1956-01-15, 56 y.o.   MRN: [id number]  HPI Ms. Lafond presents to discuss the following:  1. Hypotension: had episode of hypotension associated with fatigue last week. Home BP was 96/58. Patient went to bed and recheck in the AM and it was normal. She denies associated headaches, visual changes, palpitations, shortness of breath or presyncope. She denies recurrent episodes. She is compliant with all of her BP medication.   2. Diagnosed with H. Pylori on 01/05/12 on blood work obtained at [facility]. Has been prescribed prevpac. Has not yet started the treatment. Is compliant with Prilosec. Stated that GERD has been well controlled.   3. Medically supervised weight loss plan. Weight 292 Weight change since last visit: -4 lbs Body mass index is 47.13 kg/(m^2). Exercise: compliant -line dancing x 60 min; 3x per week  -curves gym x 30 min; 4x per week  Diet: compliant  -no sodas -increased vegetables -decreased starches  -increase in baked and boil foods. -smaller portions, using divided food plate -3oz of protein per meal   Lifestyle modifications: compliant but limited -increased exercise tolerance.  -reports healthier relationship with food (few cravings, planned meals) -not able to walk stair or long distance due to pain in knees.    Review of Systems As per HPI    Objective:   Physical Exam BP 95/67  Pulse 80  Temp(Src) 98.5 F (36.9 C) (Oral)  Ht 5\' 6"  (1.676 m)  Wt 292 lb (132.45 kg)  BMI 47.13 kg/m2 General appearance: alert, cooperative, no distress and morbidly obese Heart: regular rate and rhythm, S1, S2 normal, no murmur, click, rub or gallop Lungs: normal work of breathing. Clear to auscultation bilaterally.  Extremities: extremities normal, atraumatic, no cyanosis and edema trace Neurologic: Grossly normal  Normal orthostatic vital signs    Assessment and Plan:  See problem list.

## 2012-02-09 NOTE — Telephone Encounter (Signed)
Pt got meds from [facility] for an infection and the pharmacy states that diltiazem (CARDIZEM) 30 MG  Is going to interfere with the meds that they gave her.  Pharmacy is asking [place] to call to see what we need to do.  [facility] - [phone]

## 2012-02-09 NOTE — Telephone Encounter (Signed)
Will forward message to Dr. Funches .  

## 2012-02-09 NOTE — Patient Instructions (Signed)
[name],  Thank you for coming to see me today. For your BP:  stop coreg by weaning. Take 1/2 tab daily for one week. Then once daily for one week. Then stop. If you notice palpitation or chest pains go back to your normal dose.  Please f/u in one month.   Dr. Armen Pickup

## 2012-02-09 NOTE — Telephone Encounter (Signed)
Called patient back the plan is stop the diltiazem instead of the coreg. Patient voiced understanding. Coreg added back to med list.    Called [name] pharmacy: ok to fill prevpac. Patient knows to stop diltiazem.

## 2012-02-09 NOTE — Assessment & Plan Note (Signed)
A: improved. Patient with postive attitude regarding weight loss. She is pleased with her increase in energy and decrease in overall body pain.  P: -continue current plan.  -advised slowly incorporating more lifestyle modifications as tolerated.

## 2012-02-09 NOTE — Assessment & Plan Note (Addendum)
A: BP normal with episodes of low BP.  P: -stop diltiazem (interaction with prevpac).  -f/u repeat BP off diltiazem in one month.  -with continued weight loss and improved diet the ultimate goal is to control pt with a low dose ACEi and thiazide diuretic given history of CVA.

## 2012-02-14 ENCOUNTER — Telehealth: Payer: Self-pay | Admitting: Family Medicine

## 2012-02-14 NOTE — Telephone Encounter (Signed)
Called patient back she is to stop the diltiazem.  She is also weaning the coreg. BP 90/66. S Continue to monitor BPs.

## 2012-02-14 NOTE — Telephone Encounter (Signed)
Patient is calling because she was told to stop taking Diltiazem while on another medication, but she wants to know if she should start taking the Diltiazem when she is done with the other.

## 2012-03-10 ENCOUNTER — Ambulatory Visit (INDEPENDENT_AMBULATORY_CARE_PROVIDER_SITE_OTHER): Payer: Medicare PPO | Admitting: Family Medicine

## 2012-03-10 ENCOUNTER — Encounter: Payer: Self-pay | Admitting: Family Medicine

## 2012-03-10 VITALS — BP 128/86 | HR 91 | Temp 98.5°F | Ht 66.0 in | Wt 292.0 lb

## 2012-03-10 DIAGNOSIS — J45991 Cough variant asthma: Secondary | ICD-10-CM | POA: Insufficient documentation

## 2012-03-10 DIAGNOSIS — J309 Allergic rhinitis, unspecified: Secondary | ICD-10-CM

## 2012-03-10 DIAGNOSIS — E1169 Type 2 diabetes mellitus with other specified complication: Secondary | ICD-10-CM

## 2012-03-10 DIAGNOSIS — A048 Other specified bacterial intestinal infections: Secondary | ICD-10-CM

## 2012-03-10 DIAGNOSIS — E669 Obesity, unspecified: Secondary | ICD-10-CM

## 2012-03-10 HISTORY — DX: Cough variant asthma: J45.991

## 2012-03-10 LAB — POCT GLYCOSYLATED HEMOGLOBIN (HGB A1C): Hemoglobin A1C: 5.9

## 2012-03-10 MED ORDER — BENZONATATE 200 MG PO CAPS: 200.0000 mg | ORAL_CAPSULE | Freq: Every evening | ORAL | Status: AC | PRN

## 2012-03-10 NOTE — Assessment & Plan Note (Signed)
A: improved overall. No weight loss per clinic scale. Bariatric surgery planned for 05/2012. P:  -continue current plan.  -f/u in 1 month  bariatric surgery in 2 months.

## 2012-03-10 NOTE — Progress Notes (Signed)
Patient ID: [name], female   DOB: Apr 25, 1956, 56 y.o.   MRN: [id number] Subjective:   HPI Ms. [name] presents to discuss the following:  1. Medically supervised weight loss plan. Weight 292 lb in the office, but 285 lb at home and 286 lb 5 days ago at cardiology clinic per patient.  Weight change since last visit: 0 lbs  (8 lbs based on home scale).  Body mass index is 47.13 kg/(m^2). Exercise: compliant -line dancing x 60 min; 3x per week  -curves gym x 30 min; 4x per week -water aerobics x 30 min; 2x per week  Diet: compliant  -no sodas - vegetables 3 servings per day - starches (no bread), brown rice, only 3 times per week -eats baked and boil foods. -fried foods only once per month  -smaller portions, using divided food plate -3oz of protein per meal   Lifestyle modifications: compliant but limited -increased exercise tolerance.  -reports healthier relationship with food (no food cravings, planned meals) -decreased pain in knees with standing and exercise, pain related to damp weather   2. Cough: present at night, x 3 days. Denies fevers, chills, nigh sweats, productive cough.   History: non-smoker, smoked for a few years as a teen.   Review of Systems As per HPI    Objective:   Physical Exam BP 128/86  Pulse 91  Temp(Src) 98.5 F (36.9 C) (Oral)  Ht 5\' 6"  (1.676 m)  Wt 292 lb (132.45 kg)  BMI 47.13 kg/m2 General appearance: alert, cooperative, no distress and morbidly obese Heart: regular rate and rhythm, S1, S2 normal, no murmur, click, rub or gallop Lungs: normal work of breathing. Clear to auscultation bilaterally.  Extremities: extremities normal, atraumatic, no cyanosis and edema trace Neurologic: Grossly normal     Assessment and Plan:  See problem list.

## 2012-03-10 NOTE — Assessment & Plan Note (Signed)
A: cough concerning for asthma cough variant. No evidence of infection. May also be exacerbation of GERD. P: -continue albuterol and PPI -Tessalon perles prn

## 2012-03-20 ENCOUNTER — Ambulatory Visit (INDEPENDENT_AMBULATORY_CARE_PROVIDER_SITE_OTHER): Payer: Medicare PPO | Admitting: *Deleted

## 2012-03-20 ENCOUNTER — Ambulatory Visit (INDEPENDENT_AMBULATORY_CARE_PROVIDER_SITE_OTHER): Payer: Medicare PPO | Admitting: Family Medicine

## 2012-03-20 ENCOUNTER — Encounter: Payer: Self-pay | Admitting: Family Medicine

## 2012-03-20 VITALS — BP 150/106 | HR 84

## 2012-03-20 VITALS — BP 148/106 | HR 84 | Ht 66.0 in | Wt 289.7 lb

## 2012-03-20 DIAGNOSIS — I10 Essential (primary) hypertension: Secondary | ICD-10-CM

## 2012-03-20 DIAGNOSIS — R300 Dysuria: Secondary | ICD-10-CM | POA: Insufficient documentation

## 2012-03-20 LAB — POCT URINALYSIS DIPSTICK
Bilirubin, UA: NEGATIVE
Blood, UA: NEGATIVE
Glucose, UA: NEGATIVE
Ketones, UA: NEGATIVE
Nitrite, UA: NEGATIVE
Protein, UA: NEGATIVE
Spec Grav, UA: 1.015
Urobilinogen, UA: 0.2
pH, UA: 5.5

## 2012-03-20 LAB — POCT UA - MICROSCOPIC ONLY

## 2012-03-20 MED ORDER — CARVEDILOL 12.5 MG PO TABS: 12.5000 mg | ORAL_TABLET | Freq: Two times a day (BID) | ORAL | Status: DC

## 2012-03-20 NOTE — Progress Notes (Signed)
Addended by: [place], [place] N[place] on: 03/20/2012 12:22 PM   Modules accepted: Orders

## 2012-03-20 NOTE — Assessment & Plan Note (Signed)
Since pulse high over the weekend, will add Coreg back on.  Patient is to follow up for nurse visit later this week to be sure BP better controlled.

## 2012-03-20 NOTE — Assessment & Plan Note (Signed)
Do not think this is related to BP/headaches.  Will send UA and urine culture.

## 2012-03-20 NOTE — Progress Notes (Signed)
  Subjective:    Patient ID: [name], female    DOB: 06-17-1956, 56 y.o.   MRN: [id number]  HPI  [name] came in today for a nurse visit for a BP check because she had checked it at home and it was high.  She says on Saturday she had a headache and her bp was 180/86.  Also, her pulse was 110. She denies it ever being this high before.  She denies chest pain or palpitations with the headache.  She says that several months ago her blood pressure was dropping, so Dr.Funches stopped two of her BP meds.    The patient denies change in her weight or increased swelling in her legs, but she does endorse some pain with urination and the feeling of incomplete bladder emptying.  She is not sure if this is related or not.    PMHx: Patient has history of HTN and pre-diabetes, but no CHF.  Family History  Problem Relation Age of Onset  . Cancer Father 59    Died from complications of colon CA  . Diabetes Mother   . Hypertension Mother   . Diabetes Sister   . Heart disease Sister   . Diabetes Sister   . Stroke Sister   . Diabetes Brother   . Stroke Brother   . Diabetes Brother   . Diabetes Brother   . Diabetes Brother    History  Substance Use Topics  . Smoking status: Never Smoker   . Smokeless tobacco: Never Used  . Alcohol Use: Not on file    Review of Systems Pertinent items in HPI.     Objective:   Physical Exam BP 148/106  Pulse 84  Ht 5\' 6"  (1.676 m)  Wt 289 lb 11.2 oz (131.407 kg)  BMI 46.76 kg/m2 General appearance: alert, cooperative and no distress Neck: no adenopathy, no carotid bruit, no JVD, supple, symmetrical, trachea midline and thyroid not enlarged, symmetric, no tenderness/mass/nodules Lungs: clear to auscultation bilaterally Heart: regular rate and rhythm, S1, S2 normal, no murmur, click, rub or gallop Abdomen: soft, non-tender; bowel sounds normal; no masses,  no organomegaly Extremities: Trace edmea of LE.        Assessment & Plan:

## 2012-03-20 NOTE — Progress Notes (Signed)
Patient in for BP check today.  Staes home BP yesterday was 180 /86 today 141/110. Two BP meds were stopped over a month ago she states. Saturday night had a severe headache. It was finally relieved with two tylenol and 1 Goody powder.      Having Bariatric surgery in July.   BP checked manually using large adult cuff. BP RA 150 /106 and LA 148/106 pulse 84. Appointment scheduled to see Dr. Lula Olszewski now.

## 2012-03-20 NOTE — Patient Instructions (Signed)
It was nice to meet you.  Your blood pressure today was BP: 148/106 mmHg.  Remember your goal blood pressure is about 130/80.  Please be sure to take your medications every day.  Since your pulse was also high when you took your blood pressure at home, I am adding Coreg, or carvedilol back.  You will take one pill twice a day.    Please make an appointment on your way out for a Nurse visit Thursday or Friday to check your blood pressure.  Also, bring your blood pressure cuff with you so you can compare it to your blood pressure monitors in the office.

## 2012-03-22 ENCOUNTER — Telehealth: Payer: Self-pay | Admitting: Family Medicine

## 2012-03-22 MED ORDER — SULFAMETHOXAZOLE-TRIMETHOPRIM 800-160 MG PO TABS: 1.0000 | ORAL_TABLET | Freq: Two times a day (BID) | ORAL | Status: AC

## 2012-03-22 NOTE — Telephone Encounter (Signed)
Called and left message.  Ms. [name] urine culture is growing Proteus, so I am sending in a prescription for Bactrim to her pharmacy.  She should follow the instructions on the bottle.  Asked her to call back if she has questions.

## 2012-03-23 LAB — URINE CULTURE: Colony Count: 75000

## 2012-03-24 ENCOUNTER — Ambulatory Visit (INDEPENDENT_AMBULATORY_CARE_PROVIDER_SITE_OTHER): Payer: Medicare PPO | Admitting: *Deleted

## 2012-03-24 DIAGNOSIS — I10 Essential (primary) hypertension: Secondary | ICD-10-CM

## 2012-03-24 NOTE — Progress Notes (Signed)
Patient in for BP check  At 10:00 this AM.  BP   RA 130/84, pulse 80. Weight 292.3. She is taking Coreg as directed. States she experiences sleepiness due to the Coreg. This happened when she was on it before.  States she feels she can tolerate.  She brought in her home BP machine and reading is 125/85   Brings in readings of BP at home  for past 2 days. Systolic range 104-131 and diastolic 70- 92. She will return next week to check again.

## 2012-03-29 ENCOUNTER — Telehealth: Payer: Self-pay | Admitting: Family Medicine

## 2012-03-29 NOTE — Telephone Encounter (Signed)
Will forward message to Dr. Armen Pickup. Patient came in last week for BP check on medication.

## 2012-03-29 NOTE — Telephone Encounter (Signed)
Started taking Carvedilol again and realized it is making her weak/sleepy and no energy - wants to know if she can cut it down to 1 tab daily

## 2012-03-30 ENCOUNTER — Other Ambulatory Visit: Payer: Self-pay | Admitting: *Deleted

## 2012-03-30 DIAGNOSIS — I10 Essential (primary) hypertension: Secondary | ICD-10-CM

## 2012-03-30 MED ORDER — CARVEDILOL 12.5 MG PO TABS: 6.2500 mg | ORAL_TABLET | Freq: Two times a day (BID) | ORAL | Status: DC

## 2012-03-30 MED ORDER — FUROSEMIDE 20 MG PO TABS: 20.0000 mg | ORAL_TABLET | Freq: Every day | ORAL | Status: DC | PRN

## 2012-03-30 NOTE — Telephone Encounter (Signed)
Called patient back. Plan to cut back coreg to 6.25 mg BID. Patient to call for an appointment and for nurse visit BP check in one week. She agreed with the plan and voiced understanding.

## 2012-04-04 ENCOUNTER — Other Ambulatory Visit: Payer: Self-pay | Admitting: Family Medicine

## 2012-04-04 MED ORDER — FERROUS SULFATE 325 (65 FE) MG PO TABS: 325.0000 mg | ORAL_TABLET | Freq: Three times a day (TID) | ORAL | Status: DC

## 2012-04-06 ENCOUNTER — Ambulatory Visit (INDEPENDENT_AMBULATORY_CARE_PROVIDER_SITE_OTHER): Payer: Medicare PPO | Admitting: *Deleted

## 2012-04-06 VITALS — BP 124/84 | HR 80 | Wt 290.4 lb

## 2012-04-06 DIAGNOSIS — I10 Essential (primary) hypertension: Secondary | ICD-10-CM

## 2012-04-06 NOTE — Progress Notes (Signed)
Patient in for BP and weight check. BP checked using large adult cuff.   BP LA 130/88 and RA 124/84 pulse 80. Weight today 290.4 lb.    Patient taking 1/2 tab coreg as directed but continues to feel sleepy during the day.  She has appointment with PCP 06/27.  She feels she can tolerate if she has to .  Will send message to MD and call patient back. [phone].

## 2012-04-07 ENCOUNTER — Telehealth: Payer: Self-pay | Admitting: Family Medicine

## 2012-04-07 NOTE — Telephone Encounter (Signed)
Called patient to ask about coreg. She is still getting some fatigue but is tolerating hte medication fine. She does not drive when she feels fatigued. She will continue coreg 6.25 mg BID.

## 2012-04-14 ENCOUNTER — Ambulatory Visit (INDEPENDENT_AMBULATORY_CARE_PROVIDER_SITE_OTHER): Payer: Medicare PPO | Admitting: *Deleted

## 2012-04-14 VITALS — BP 146/100 | HR 96 | Wt 290.6 lb

## 2012-04-14 DIAGNOSIS — I10 Essential (primary) hypertension: Secondary | ICD-10-CM

## 2012-04-14 NOTE — Progress Notes (Signed)
In for BP check today BP checked manually using large adult cuff. BP LA 146/ 100 and RA 140/100 pulse 96.  She is continuing Coreg as directed.  Took medication just a short time ago this AM. Was 1.5 hours later than usual  Time she takes.   Will forward to MD.

## 2012-04-21 ENCOUNTER — Encounter: Payer: Self-pay | Admitting: Family Medicine

## 2012-04-21 ENCOUNTER — Ambulatory Visit (INDEPENDENT_AMBULATORY_CARE_PROVIDER_SITE_OTHER): Payer: Medicare PPO | Admitting: *Deleted

## 2012-04-21 ENCOUNTER — Ambulatory Visit (INDEPENDENT_AMBULATORY_CARE_PROVIDER_SITE_OTHER): Payer: Medicare PPO | Admitting: Family Medicine

## 2012-04-21 VITALS — BP 100/80 | HR 92 | Temp 98.1°F | Wt 283.9 lb

## 2012-04-21 DIAGNOSIS — I9589 Other hypotension: Secondary | ICD-10-CM | POA: Insufficient documentation

## 2012-04-21 DIAGNOSIS — I959 Hypotension, unspecified: Secondary | ICD-10-CM

## 2012-04-21 DIAGNOSIS — R58 Hemorrhage, not elsewhere classified: Secondary | ICD-10-CM | POA: Insufficient documentation

## 2012-04-21 DIAGNOSIS — I10 Essential (primary) hypertension: Secondary | ICD-10-CM

## 2012-04-21 LAB — BASIC METABOLIC PANEL
BUN: 21 mg/dL (ref 6–23)
CO2: 23 mEq/L (ref 19–32)
Calcium: 10 mg/dL (ref 8.4–10.5)
Chloride: 106 mEq/L (ref 96–112)
Creat: 0.86 mg/dL (ref 0.50–1.10)
Glucose, Bld: 85 mg/dL (ref 70–99)
Potassium: 4.2 mEq/L (ref 3.5–5.3)
Sodium: 140 mEq/L (ref 135–145)

## 2012-04-21 NOTE — Patient Instructions (Addendum)
Stop coreg Come back as scheduled  If you get chest pain, short of breath, or other concerns give [place] a call before that

## 2012-04-21 NOTE — Progress Notes (Signed)
Patient in for Nurse visit to check BP .  Today BP low, . Patient reports starting a new diet plan on 06/17 for her upcoming bariatric surgery . Has been exercising three times daily. Has been experiencing headache for 3 days .  General weakness. Will have MD see today regarding current symptoms. Has appointment with PCP 06/27

## 2012-04-21 NOTE — Progress Notes (Signed)
Patient in office  For repeat BP check. States she has had headache for 3 days. Feeling weak.  Started  following new diet on 06/17. Exercising 3 times a day.  BP  checked manually using large adult cuff.  BP , LA 100/80 and RA 100/80 pulse 92.   Weight today 283.9 # Scheduled appointment for patient to be seen as work in with Dr. Hulen Luster now.

## 2012-04-21 NOTE — Assessment & Plan Note (Signed)
Low blood pressures as well as feeling of generalized weakness since starting Coreg. Pulse is not that low but given the timeframe of her symptoms, will stop Coreg today since I cannot find a definite indication for beta blocker. She is to come back next week to see her primary care doctor. I will check a BMET today to make sure that nothing is abnormal. If she needs further blood control the future, consider increasing her lisinopril rather than adding back Coreg and list there is a strong indication for beta blocker.

## 2012-04-21 NOTE — Progress Notes (Signed)
  Subjective:    Patient ID: [name], female    DOB: 04-Aug-1956, 56 y.o.   MRN: [id number]  HPI Patient seen today for blood pressure check. Since she started Coreg, she has been feeling generalized weakness. Today she is also noted to have a low blood pressure. She has not had any chest pain or shortness of breath. She has not had any nausea or vomiting. She does not have any urinary symptoms. She is scheduled to have bariatric surgery in July. She has been changing her diet to comply with bariatric diet before that but has been staying very hydrated. She says that before she started Coreg, she felt fine.   Review of Systems See above    Objective:   Physical Exam  Vital signs reviewed General appearance - alert, well appearing, and in no distress Heart - normal rate, regular rhythm, normal S1, S2, no murmurs, rubs, clicks or gallops Chest - clear to auscultation, no wheezes, rales or rhonchi, symmetric air entry, no tachypnea, retractions or cyanosis Extremities - peripheral pulses normal, no pedal edema, no clubbing or cyanosis       Assessment & Plan:

## 2012-04-27 ENCOUNTER — Encounter: Payer: Self-pay | Admitting: Family Medicine

## 2012-04-27 ENCOUNTER — Ambulatory Visit (INDEPENDENT_AMBULATORY_CARE_PROVIDER_SITE_OTHER): Payer: Medicare PPO | Admitting: Family Medicine

## 2012-04-27 VITALS — BP 126/90 | HR 119 | Temp 98.7°F | Ht 66.0 in | Wt 279.0 lb

## 2012-04-27 DIAGNOSIS — E1169 Type 2 diabetes mellitus with other specified complication: Secondary | ICD-10-CM

## 2012-04-27 DIAGNOSIS — I10 Essential (primary) hypertension: Secondary | ICD-10-CM

## 2012-04-27 DIAGNOSIS — E669 Obesity, unspecified: Secondary | ICD-10-CM

## 2012-04-27 NOTE — Assessment & Plan Note (Signed)
A: improving. Patient continues to lose weight. P: -continue diet and exercise regimen -gastric bypass scheduled from May 08, 2012.

## 2012-04-27 NOTE — Assessment & Plan Note (Signed)
A: Diet controlled. A1c below cutoff for diabetes. Normal diabetic foot exam today. As patient has not symptoms of hyperglycemia or impaired glucose tolerance I do not believe she will benefit from or require a yearly ophthalmologicl exam.  P: Continue to follow  -Eye exam every 5-10 year or as needed based on symptoms.

## 2012-04-27 NOTE — Assessment & Plan Note (Signed)
A: well controlled. Off coreg. Reviewed last ECHO from 2007 EF 60%.  Medications: compliant.  P: -d/c lasix  -f/u in 2 months.

## 2012-04-27 NOTE — Progress Notes (Signed)
Patient ID: [name], female   DOB: Sep 11, 1956, 56 y.o.   MRN: [id number]  Subjective:   HPI [name] presents to discuss the following:  1. Medically supervised weight loss plan. Weight 292 lb in the office, but 285 lb at home and 286 lb 5 days ago at cardiology clinic per patient.  Weight change since last visit: 0 lbs  (8 lbs based on home scale).  Body mass index is 45.03 kg/(m^2). Exercise: compliant -line dancing x 60 min; 3x per week  -curves gym x 30 min; 4x per week -herbal life boot camp 6 AM, 30 min; 3x per week -herbal life aerobics   Diet: compliant  -no sodas - vegetables 3 servings per day - starches (no bread), brown rice, only 3 times per week -eats baked and boil foods. -fried foods only once per month  -smaller portions, using divided food plate -3oz of protein per meal   Lifestyle modifications: compliant but limited -increased exercise tolerance.  -reports healthier relationship with food (no food cravings, planned meals) -decreased pain in knees with standing and exercise, pain related to damp weather   2. HTN: improved. Stopped coreg per recommendation of bariatric nutritionist 1 week ago. Checks BP at home range 10-120/78-80. Denies CP, SOB. Has small amount of LE edema 2 weeks ago. None since then.   3. Diabetes: diet controlled. Patient does not check blood sugar at home. Denies polyuria or polydipsia.   Health maintenance: overdue for colonoscopy.    History: non-smoker, smoked for a few years as a teen.   Review of Systems As per HPI    Objective:   Physical Exam BP 126/90  Pulse 119  Temp 98.7 F (37.1 C) (Oral)  Ht 5\' 6"  (1.676 m)  Wt 279 lb (126.554 kg)  BMI 45.03 kg/m2 General appearance: alert, cooperative, no distress and morbidly obese Heart: regular rate and rhythm, S1, S2 normal, no murmur, click, rub or gallop Lungs: normal work of breathing. Clear to auscultation bilaterally.  Extremities: extremities normal,  atraumatic, no cyanosis and edema trace Neurologic: Grossly normal Diabetic foot exam done: callus on bilateral heel, normal monofilament and pulse.      Assessment and Plan:  See problem list.

## 2012-04-27 NOTE — Patient Instructions (Addendum)
Shaquna,  I am very proud of your weight lost success. Please keep up all the excellent work.  Please stop taking lasix.   All the best with your surgery!  F/u in 1 months post surgery.  Dr. Armen Pickup

## 2012-05-01 ENCOUNTER — Telehealth: Payer: Self-pay | Admitting: Family Medicine

## 2012-05-01 NOTE — Telephone Encounter (Signed)
Pt is asking to speak to nurse about her BP

## 2012-05-01 NOTE — Telephone Encounter (Signed)
Spoke with patient this AM at 9:15 . States she  had no problems on 06/28 and 06/29 and BP was in a normal range  when she checked it. However yesterday she felt bad.  BP at 1:00 PM was 85/76. At 4:00 PM 98/82 . At 10:00 PM 112/79 and at 11:00 PM 104/67. Did not take clonidine last night . Has not taken BP meds this AM yet. BP this AM 117/96. Reports she feels weak and sleepy and has no energy.     Paged Dr. Armen Pickup and she advises to stop  Apresoline and continue all other meds and follow up with Dr. Armen Pickup this week. Appointment scheduled  for 07/03.  Patient agreeable to this plan.

## 2012-05-03 ENCOUNTER — Encounter: Payer: Self-pay | Admitting: Family Medicine

## 2012-05-03 ENCOUNTER — Ambulatory Visit (INDEPENDENT_AMBULATORY_CARE_PROVIDER_SITE_OTHER): Payer: Medicare PPO | Admitting: Family Medicine

## 2012-05-03 VITALS — BP 110/80 | HR 74 | Temp 98.4°F | Ht 66.0 in | Wt 279.0 lb

## 2012-05-03 DIAGNOSIS — I10 Essential (primary) hypertension: Secondary | ICD-10-CM

## 2012-05-03 NOTE — Progress Notes (Signed)
  Subjective:    Patient ID: [name], female    DOB: [birth year], 56 y.o.   MRN: [id number]  HPI 56 yo F presents for HTN f/u following medication changes. She has stopped coreg and hydralazine. She denies dizziness, fatigue, chest pain, SOB and LE edema.   Review of Systems As per HPI    Objective:   Physical Exam BP 110/80  Pulse 74  Temp 98.4 F (36.9 C) (Oral)  Ht 5\' 6"  (1.676 m)  Wt 279 lb (126.554 kg)  BMI 45.03 kg/m2 General appearance: alert, cooperative, no distress and morbidly obese Lungs: clear to auscultation bilaterally Heart: regular rate and rhythm, S1, S2 normal, no murmur, click, rub or gallop Extremities: extremities normal, atraumatic, no cyanosis or edema  Assessment & Plan:

## 2012-05-03 NOTE — Patient Instructions (Addendum)
Thank you for coming in today,  As we discussed we have stopped coreg and hydralazine. F/u with me after you surgery and we will work on stopping more medications.   Dr. Armen Pickup

## 2012-05-03 NOTE — Assessment & Plan Note (Signed)
A: well controlled. Meds: continue current regimen P: f/u in 6 weeks for medication management.

## 2012-05-07 HISTORY — PX: ROUX-EN-Y GASTRIC BYPASS: SHX1104

## 2012-06-07 ENCOUNTER — Encounter: Payer: Self-pay | Admitting: Family Medicine

## 2012-06-07 ENCOUNTER — Ambulatory Visit (INDEPENDENT_AMBULATORY_CARE_PROVIDER_SITE_OTHER): Payer: Medicare PPO | Admitting: Family Medicine

## 2012-06-07 VITALS — BP 149/96 | HR 72 | Temp 98.3°F | Ht 66.0 in | Wt 268.0 lb

## 2012-06-07 DIAGNOSIS — E1169 Type 2 diabetes mellitus with other specified complication: Secondary | ICD-10-CM

## 2012-06-07 DIAGNOSIS — Z9884 Bariatric surgery status: Secondary | ICD-10-CM

## 2012-06-07 DIAGNOSIS — Z79899 Other long term (current) drug therapy: Secondary | ICD-10-CM

## 2012-06-07 DIAGNOSIS — I10 Essential (primary) hypertension: Secondary | ICD-10-CM

## 2012-06-07 LAB — BASIC METABOLIC PANEL
BUN: 10 mg/dL (ref 6–23)
CO2: 25 mEq/L (ref 19–32)
Calcium: 9.2 mg/dL (ref 8.4–10.5)
Chloride: 108 mEq/L (ref 96–112)
Creat: 0.65 mg/dL (ref 0.50–1.10)
Glucose, Bld: 86 mg/dL (ref 70–99)
Potassium: 3.3 mEq/L — ABNORMAL LOW (ref 3.5–5.3)
Sodium: 143 mEq/L (ref 135–145)

## 2012-06-07 LAB — POCT GLYCOSYLATED HEMOGLOBIN (HGB A1C): Hemoglobin A1C: 6.1

## 2012-06-07 NOTE — Assessment & Plan Note (Signed)
A: diet controlled.  Lab Results  Component Value Date   HGBA1C 6.1 06/07/2012  P: F/u A1c in 3 months. I expect it to trend down with continued weight loss.

## 2012-06-07 NOTE — Assessment & Plan Note (Signed)
A: declined.  Meds: compliant. Stopped aldactone.  P: Check BMP, if  normal K+ and Cr increase lisinopril to 20 mg daily.

## 2012-06-07 NOTE — Progress Notes (Signed)
Subjective:     Patient ID: [name], female   DOB: 05/28/1956, 56 y.o.   MRN: [id number]  HPI 56 yo F presents to discuss the following:  1. S/p gastric bypass surgery: surgery on 05/08/12, lost 11 lbs. Had some medications changes made, up dated in chart. Admits to one episode of gastric pain with small volume emesis yesterday. Has planned swallow evaluation at [facility] next week. Denies fever, chills, diarrhea, abdominal pain. Admits to baseline constipation. She denies low back pain.   2. HTN: BP has been elevated. She was on multiple BP meds but is being titrated off as she looses weight. She is not taking lisinopril 10 daily, isordil 20 mg TID, and clonidine 0.2 mg BID.   3. DM: no medications. Diet controlled. Getting back into her exercise regimen this week. Denies feeling of low blood sugar, malaise, weakness, GI upset.   Review of Systems As per HPI     Objective:   Physical Exam BP 149/96  Pulse 72  Temp 98.3 F (36.8 C) (Oral)  Ht 5\' 6"  (1.676 m)  Wt 268 lb (121.564 kg)  BMI 43.26 kg/m2 BP Readings from Last 3 Encounters:  06/07/12 149/96  05/03/12 110/80  04/27/12 126/90   General appearance: alert, cooperative and no distress Lungs: clear to auscultation bilaterally Heart: regular rate and rhythm, S1, S2 normal, no murmur, click, rub or gallop Abdomen:well healed laparoscopy[y scar  soft, non-tender; bowel sounds normal; no masses,  no organomegaly Extremities: extremities normal, atraumatic, no cyanosis or edema Neurologic: Grossly normal    Assessment and Plan:

## 2012-06-07 NOTE — Assessment & Plan Note (Signed)
A: doing well. One episode of dysphagia.  P:  Swallow eval next week Taking supplemental vitamins Will need f/u labs, patient to bring information from the weight loss center.

## 2012-06-07 NOTE — Patient Instructions (Addendum)
[name],  Thank you for coming in today. You look wonderful. You appear to be healing well.   For your BP: Check BMP today Likely increase lisinopril to 20 mg daily. Plan for BP f/u in 3-4 weeks  Dr. Armen Pickup

## 2012-06-09 ENCOUNTER — Telehealth: Payer: Self-pay | Admitting: Family Medicine

## 2012-06-09 DIAGNOSIS — I10 Essential (primary) hypertension: Secondary | ICD-10-CM

## 2012-06-09 MED ORDER — LISINOPRIL 20 MG PO TABS: 20.0000 mg | ORAL_TABLET | Freq: Every day | ORAL | Status: DC

## 2012-06-09 NOTE — Telephone Encounter (Signed)
Wants to know about bloodwork for her Lisinopril - has only 2 pills left and needs to know what she should do. [facility]

## 2012-06-09 NOTE — Telephone Encounter (Signed)
Called patient. Left VM.  Sent in lisinopril with increased does of 20 mg daily. Slight ow K of 3.3, goal 4, increase lisinopril should help this. Also aim at getting veggies in with every lunch and dinner.

## 2012-06-23 ENCOUNTER — Encounter: Payer: Self-pay | Admitting: Family Medicine

## 2012-06-23 ENCOUNTER — Ambulatory Visit (INDEPENDENT_AMBULATORY_CARE_PROVIDER_SITE_OTHER): Payer: Medicare PPO | Admitting: Family Medicine

## 2012-06-23 VITALS — BP 148/96 | HR 87 | Temp 98.3°F | Ht 66.0 in | Wt 262.0 lb

## 2012-06-23 DIAGNOSIS — E569 Vitamin deficiency, unspecified: Secondary | ICD-10-CM | POA: Insufficient documentation

## 2012-06-23 DIAGNOSIS — E669 Obesity, unspecified: Secondary | ICD-10-CM

## 2012-06-23 DIAGNOSIS — E1169 Type 2 diabetes mellitus with other specified complication: Secondary | ICD-10-CM

## 2012-06-23 DIAGNOSIS — I10 Essential (primary) hypertension: Secondary | ICD-10-CM

## 2012-06-23 DIAGNOSIS — Z9884 Bariatric surgery status: Secondary | ICD-10-CM

## 2012-06-23 LAB — CBC
HCT: 34.5 % — ABNORMAL LOW (ref 36.0–46.0)
Hemoglobin: 11.6 g/dL — ABNORMAL LOW (ref 12.0–15.0)
MCH: 29.4 pg (ref 26.0–34.0)
MCHC: 33.6 g/dL (ref 30.0–36.0)
MCV: 87.6 fL (ref 78.0–100.0)
Platelets: 271 10*3/uL (ref 150–400)
RBC: 3.94 MIL/uL (ref 3.87–5.11)
RDW: 15.5 % (ref 11.5–15.5)
WBC: 6.7 10*3/uL (ref 4.0–10.5)

## 2012-06-23 LAB — COMPREHENSIVE METABOLIC PANEL
ALT: 19 U/L (ref 0–35)
AST: 20 U/L (ref 0–37)
Albumin: 3.6 g/dL (ref 3.5–5.2)
Alkaline Phosphatase: 82 U/L (ref 39–117)
BUN: 11 mg/dL (ref 6–23)
CO2: 27 mEq/L (ref 19–32)
Calcium: 9.3 mg/dL (ref 8.4–10.5)
Chloride: 108 mEq/L (ref 96–112)
Creat: 0.64 mg/dL (ref 0.50–1.10)
Glucose, Bld: 91 mg/dL (ref 70–99)
Potassium: 3.4 mEq/L — ABNORMAL LOW (ref 3.5–5.3)
Sodium: 145 mEq/L (ref 135–145)
Total Bilirubin: 0.3 mg/dL (ref 0.3–1.2)
Total Protein: 6.7 g/dL (ref 6.0–8.3)

## 2012-06-23 LAB — VITAMIN B12: Vitamin B-12: >2000 pg/mL — ABNORMAL HIGH (ref 211–911)

## 2012-06-23 MED ORDER — HYDROCHLOROTHIAZIDE 12.5 MG PO CAPS: 12.5000 mg | ORAL_CAPSULE | Freq: Every day | ORAL | Status: DC

## 2012-06-23 NOTE — Patient Instructions (Addendum)
[name],  Thank you for coming in today.   For headaches: 1. Start HCTZ 1 tab daily  2. Try your tramadol for severe pain.  3. Referral to opthalmology: for DM f/u.   See me in 1 month I will fax blood work results to [phone] (Attn: Berna Spare).   Dr. Armen Pickup

## 2012-06-23 NOTE — Assessment & Plan Note (Addendum)
A: losing weight. Swallowing no longer an issue. P: draw f/u labs per instructions provided by [facility] weight loss clinic and fax to clinic.  I will fax blood work results to [phone] (Attn: Berna Spare).

## 2012-06-23 NOTE — Assessment & Plan Note (Addendum)
A: declined despite continued weight loss. Symptomatic with tension type headache  P: check electrolytes today Start HCTZ 12.5 mg daily  Encouraged patient to try tramadol prn moderate headache

## 2012-06-23 NOTE — Progress Notes (Signed)
Subjective:     Patient ID: [name], female   DOB: 07/23/1956, 56 y.o.   MRN: [id number]  HPI 56 yo F presents to discuss the following:  1. Headaches: frontal, sharp, bilateral. Moderate to severe. Non-radiating. Partially relieved by rest and aspirin. Exacerbated by nothing. Admits to feeling pressure in R eye. Denies vision changes, weakness, numbness, slurred speech and fever.   2. Elevated BP: x1 month since gastric bypass. Elevated in office and at home. Admit to headache and swelling. Denies CP and SOB.   3. Gastric bypass f/u: need labs to rule out vitamin deficiency/electrolyte derangements.   Review of Systems As per HPI     Objective:   Physical Exam BP 148/96  Pulse 87  Temp 98.3 F (36.8 C) (Oral)  Ht 5\' 6"  (1.676 m)  Wt 262 lb (118.842 kg)  BMI 42.29 kg/m2 BP Readings from Last 3 Encounters:  06/23/12 148/96  06/07/12 149/96  05/03/12 110/80    General appearance: alert, cooperative and no distress Head: Normocephalic, without obvious abnormality, atraumatic Eyes: conjunctivae/corneas clear. PERRL, EOM's intact.  Neurologic: Alert and oriented X 3, normal strength and tone. Normal symmetric reflexes. Normal coordination and gait     Assessment:

## 2012-06-24 LAB — IRON: Iron: 110 ug/dL (ref 42–145)

## 2012-06-24 LAB — IBC PANEL
%SAT: 30 % (ref 20–55)
TIBC: 364 ug/dL (ref 250–470)
UIBC: 254 ug/dL (ref 125–400)

## 2012-06-24 LAB — VITAMIN D 25 HYDROXY (VIT D DEFICIENCY, FRACTURES): Vit D, 25-Hydroxy: 46 ng/mL (ref 30–89)

## 2012-06-27 ENCOUNTER — Encounter: Payer: Self-pay | Admitting: Family Medicine

## 2012-06-27 LAB — VITAMIN B1: Vitamin B1 (Thiamine): 14 nmol/L (ref 8–30)

## 2012-06-29 ENCOUNTER — Encounter: Payer: Self-pay | Admitting: Family Medicine

## 2012-07-17 ENCOUNTER — Ambulatory Visit: Payer: Medicare PPO

## 2012-07-18 ENCOUNTER — Other Ambulatory Visit: Payer: Self-pay | Admitting: *Deleted

## 2012-07-18 ENCOUNTER — Ambulatory Visit (INDEPENDENT_AMBULATORY_CARE_PROVIDER_SITE_OTHER): Payer: Medicare PPO | Admitting: *Deleted

## 2012-07-18 DIAGNOSIS — I10 Essential (primary) hypertension: Secondary | ICD-10-CM

## 2012-07-18 DIAGNOSIS — Z23 Encounter for immunization: Secondary | ICD-10-CM

## 2012-07-18 DIAGNOSIS — Z111 Encounter for screening for respiratory tuberculosis: Secondary | ICD-10-CM

## 2012-07-18 MED ORDER — HYDROCHLOROTHIAZIDE 12.5 MG PO CAPS: 12.5000 mg | ORAL_CAPSULE | Freq: Every day | ORAL | Status: DC

## 2012-07-18 NOTE — Telephone Encounter (Signed)
Patient wants 90 day supply of medication sent in for next refill.

## 2012-07-20 ENCOUNTER — Ambulatory Visit (INDEPENDENT_AMBULATORY_CARE_PROVIDER_SITE_OTHER): Payer: Medicare PPO | Admitting: *Deleted

## 2012-07-20 VITALS — BP 145/88 | HR 70

## 2012-07-20 DIAGNOSIS — IMO0001 Reserved for inherently not codable concepts without codable children: Secondary | ICD-10-CM

## 2012-07-20 DIAGNOSIS — Z111 Encounter for screening for respiratory tuberculosis: Secondary | ICD-10-CM

## 2012-07-20 LAB — TB SKIN TEST
Induration: 0 mm
TB Skin Test: NEGATIVE

## 2012-07-20 NOTE — Progress Notes (Signed)
Patient reports recently her BP is low at night. Around 8:30 she will go to sleep and sometimes has a fluttering sensation in chest.  BP 100/60 at these times. Occasionally she does not take clonidine at night and feels better.  BP in higher range. Taking lisinopril and HCTZ as directed. BP today checked with dynamap RA 143/92 and LA 145/88. Pulse 70. Consulted with Dr. Mauricio Po and he advises to leave off clonidine completely ( has been taking twice daily.) Appointment scheduled Monday 09/23 for follow up with MD.

## 2012-07-24 ENCOUNTER — Ambulatory Visit: Payer: Medicare PPO | Admitting: Family Medicine

## 2012-07-24 ENCOUNTER — Telehealth: Payer: Self-pay | Admitting: Family Medicine

## 2012-07-24 NOTE — Telephone Encounter (Signed)
[name] could not keep appt due to not having someone relieve her at work, also stopped taking the bp med she was put on because it shot her blood pressure to 170/114.  Please call her to discuss this issue

## 2012-07-24 NOTE — Telephone Encounter (Signed)
LMOVM for pt to return call .[name] Dawn  

## 2012-08-07 ENCOUNTER — Other Ambulatory Visit: Payer: Self-pay | Admitting: *Deleted

## 2012-08-08 ENCOUNTER — Other Ambulatory Visit: Payer: Self-pay | Admitting: *Deleted

## 2012-08-08 MED ORDER — POLYETHYLENE GLYCOL 3350 17 GM/SCOOP PO POWD: 17.0000 g | Freq: Every day | ORAL | Status: DC

## 2012-08-08 MED ORDER — ISOSORBIDE DINITRATE 20 MG PO TABS: 20.0000 mg | ORAL_TABLET | Freq: Three times a day (TID) | ORAL | Status: DC

## 2012-08-16 ENCOUNTER — Encounter: Payer: Self-pay | Admitting: Family Medicine

## 2012-08-18 ENCOUNTER — Encounter: Payer: Medicare PPO | Admitting: Family Medicine

## 2012-08-18 NOTE — Progress Notes (Signed)
This encounter was created in error - please disregard.

## 2012-08-21 ENCOUNTER — Telehealth: Payer: Self-pay | Admitting: Family Medicine

## 2012-08-21 DIAGNOSIS — K219 Gastro-esophageal reflux disease without esophagitis: Secondary | ICD-10-CM

## 2012-08-21 NOTE — Telephone Encounter (Signed)
Wants to talk to doctor about changing her acid reflux meds -

## 2012-08-22 NOTE — Telephone Encounter (Signed)
Spoke with patient.  She states that the nexium is not helping but the prilosec did.  She states that she spoke with [facility] and they said all we would have to do is complete a prior approval.  Will forward to [place] (she coordinates the PA) and Dr. Armen Pickup. [place], Maryjo [place]

## 2012-08-22 NOTE — Telephone Encounter (Signed)
Attempted calling insurance to have them fax form , however have to have RX for Prilosec sent to pharmacy before PA can be started. Will forward back to Dr. Armen Pickup.

## 2012-08-23 MED ORDER — OMEPRAZOLE 40 MG PO CPDR: 40.0000 mg | DELAYED_RELEASE_CAPSULE | Freq: Every day | ORAL | Status: DC

## 2012-08-23 NOTE — Telephone Encounter (Signed)
PA from placed in MD box.

## 2012-08-23 NOTE — Telephone Encounter (Signed)
Prilosec sent to pharmacy. Will await PA form.

## 2012-08-24 DIAGNOSIS — K219 Gastro-esophageal reflux disease without esophagitis: Secondary | ICD-10-CM | POA: Insufficient documentation

## 2012-08-24 NOTE — Telephone Encounter (Signed)
PA form filled out and returned to [place].  

## 2012-08-29 ENCOUNTER — Ambulatory Visit (INDEPENDENT_AMBULATORY_CARE_PROVIDER_SITE_OTHER): Payer: Medicare PPO | Admitting: Family Medicine

## 2012-08-29 ENCOUNTER — Encounter: Payer: Self-pay | Admitting: Family Medicine

## 2012-08-29 VITALS — BP 144/98 | HR 72 | Ht 64.0 in | Wt 236.0 lb

## 2012-08-29 DIAGNOSIS — K219 Gastro-esophageal reflux disease without esophagitis: Secondary | ICD-10-CM

## 2012-08-29 DIAGNOSIS — J45991 Cough variant asthma: Secondary | ICD-10-CM

## 2012-08-29 DIAGNOSIS — G4700 Insomnia, unspecified: Secondary | ICD-10-CM

## 2012-08-29 DIAGNOSIS — E669 Obesity, unspecified: Secondary | ICD-10-CM

## 2012-08-29 DIAGNOSIS — E1169 Type 2 diabetes mellitus with other specified complication: Secondary | ICD-10-CM

## 2012-08-29 DIAGNOSIS — I10 Essential (primary) hypertension: Secondary | ICD-10-CM

## 2012-08-29 HISTORY — DX: Insomnia, unspecified: G47.00

## 2012-08-29 LAB — LDL CHOLESTEROL, DIRECT: Direct LDL: 66 mg/dL

## 2012-08-29 MED ORDER — ZOLPIDEM TARTRATE 10 MG PO TABS: 10.0000 mg | ORAL_TABLET | Freq: Every evening | ORAL | Status: DC | PRN

## 2012-08-29 MED ORDER — ZOLPIDEM TARTRATE 10 MG PO TABS: ORAL_TABLET | ORAL | Status: DC

## 2012-08-29 NOTE — Progress Notes (Signed)
Subjective:     Patient ID: [name], female   DOB: July 17, 1956, 56 y.o.   MRN: [id number]  HPI 56 yo F presents for f/u to discuss the following:  1. HTN: lower BPs at home then at clinic. Home BPs. AM 130s-140s/80s, PM 1-teens/60-70. Feel tired at night when BP low. Denies visions changes, HA, CP, SOB. Compliant with medication. Has decreased imdur to BID from prescribed dose of TID.   2.  GERD: requested change from nexium to Prilosec. I made change. She was given omeprazole but actually wants brand name Prilosec. She reports that omeprazole generic and nexium do not relieve symptoms. Has heartburn nightly. This has worsened since gastric bypass surgery.   3. Weight loss: steady. Continuing previous exercise regimen with the addition of AM boot camp.   4. Asthma: well controlled. Using albuterol < daily.   Review of Systems As per HPI     Objective:   Physical Exam BP 144/98  Pulse 72  Ht 5\' 4"  (1.626 m)  Wt 236 lb (107.049 kg)  BMI 40.51 kg/m2 General appearance: alert, cooperative, no distress and moderately obese Lungs: clear to auscultation bilaterally Heart: regular rate and rhythm, S1, S2 normal, no murmur, click, rub or gallop Extremities: edema trace bilateral   Screening PHQ-9: score of 0    Assessment and Plan:

## 2012-08-29 NOTE — Patient Instructions (Addendum)
[name],  Thank you for coming in to see me today.  Congratulations on your continued weight loss! Keep up the exercise.   Regarding HTN- refer to pharmacy clinic for ambulatory BP monitoring. Regarding asthma-refer to pharmacy clinic for pulmonary function test.   I will send in a new script for brand name Prilosec. Call Arlys John to set up your annual wellness visit.   See me after your visit in pharmacy clinic.   Dr. Armen Pickup

## 2012-08-30 ENCOUNTER — Other Ambulatory Visit: Payer: Self-pay | Admitting: Family Medicine

## 2012-08-31 MED ORDER — OMEPRAZOLE 40 MG PO CPDR: 40.0000 mg | DELAYED_RELEASE_CAPSULE | Freq: Every day | ORAL | Status: DC

## 2012-08-31 NOTE — Assessment & Plan Note (Signed)
A: improving with weight loss. P: congratulate and encourage continued weight loss.

## 2012-08-31 NOTE — Assessment & Plan Note (Signed)
A: elevated above goal of <140/90. However home values are at goal. ? [place] coat hypertension. P: refer to pharm clinic for ambulatory BP monitoring.

## 2012-08-31 NOTE — Assessment & Plan Note (Signed)
Refer for PFTs to evaluate need for controlled medication.

## 2012-08-31 NOTE — Assessment & Plan Note (Signed)
Sent in for brand prilosec. Will await PA form.

## 2012-09-14 ENCOUNTER — Ambulatory Visit (INDEPENDENT_AMBULATORY_CARE_PROVIDER_SITE_OTHER): Payer: Medicare PPO | Admitting: Pharmacist

## 2012-09-14 ENCOUNTER — Encounter: Payer: Self-pay | Admitting: Pharmacist

## 2012-09-14 ENCOUNTER — Telehealth: Payer: Self-pay | Admitting: *Deleted

## 2012-09-14 VITALS — BP 145/94 | HR 88 | Ht 64.0 in | Wt 229.0 lb

## 2012-09-14 DIAGNOSIS — K219 Gastro-esophageal reflux disease without esophagitis: Secondary | ICD-10-CM

## 2012-09-14 DIAGNOSIS — J45991 Cough variant asthma: Secondary | ICD-10-CM

## 2012-09-14 MED ORDER — OMEPRAZOLE 40 MG PO CPDR: 40.0000 mg | DELAYED_RELEASE_CAPSULE | Freq: Every day | ORAL | Status: DC

## 2012-09-14 NOTE — Telephone Encounter (Signed)
PA required for  brand name Prilosec. Form placed in MD box. 

## 2012-09-14 NOTE — Patient Instructions (Addendum)
Thank you for coming in.  Spirometry test was performed and indicated FVC of 2.64 and FEV1 of ~2.0. Good job!! Continued weight loss encouraged for blood pressure management and asthma control. Drink 2-3 more ounces of water a day in the evening to help with low blood pressure symptoms in the evening. Schedule for an ambulatory blood pressure on a Monday or Thursday 8:30 AM.

## 2012-09-14 NOTE — Progress Notes (Signed)
  Subjective:    Patient ID: [name], female    DOB: 1955/11/25, 56 y.o.   MRN: [id number]  HPI Patient arrives in good spirits for FPFTs.  Reports she has a history fo asthma for approximately the last 6 years.  Recently (July -2013) she had a gastric bypass.   Prior to her gastric bypass she reports her weight was as high as 310.  Immediately prior to her gastric bypass her weight had decreased to 296#.    Following her procedure she has continued to lose weight and today it is noteworthy that she is excited to report she is 229#.    She is exercising daily from 5:00 AM until 6:30.   She walks ~ 0.5 miles on a walking track at the gym, walks the treadmill AND also works out with [employer].   She has noticed she can walk up two flights of stairs without stopping for breath. She denies use of you albuterol inhaler for the last 1.5 months.   She also reports her blood pressures are less at home.  We decided to reschedule ambulatory BP monitoring for an AM visit to allow for easier 24 use.    Review of Systems     Objective:   Physical Exam   See Documentation Flowsheet (discrete results - PFTs) for complete Spirometry results. Patient provided good effort while attempting spirometry.       Assessment & Plan:  Spirometry evaluation with Pre and Post Bronchodilator reveals normal lung function.  Patient has had shortness of breath for many years and taking albuterol PRN.   She has used minimal albuterol since her gastric by-pass surgery in July.   She was encourage to continue diet and exercise plan.  Continue current treatment plan at this time. Reviewed results of pulmonary function tests.  Pt verbalized understanding of results.  Written pt instructions provided.    F/U Clinic visit for AMB BP monitoring.  Patient will schedule on the way out today.  Total time in face to face counseling  25 minutes.  Patient seen with Maryanna Shape PharmD, Pharmacy Resident.

## 2012-09-15 ENCOUNTER — Encounter: Payer: Self-pay | Admitting: *Deleted

## 2012-09-15 NOTE — Assessment & Plan Note (Signed)
Patient request paperwork for PPI.  Discussed trial of alternative generic omeprazole rather than paying for brand name PPI.  Patient willing to try alternative generic omeprazole.   NEW RX called into and followed with electronic RX for omeprazole 40mg  for trial.   Patient to follow up with Dr. Armen Pickup on the success with this trial.

## 2012-09-15 NOTE — Assessment & Plan Note (Signed)
Spirometry evaluation with Pre and Post Bronchodilator reveals normal lung function.  Patient has had shortness of breath for many years and taking albuterol PRN.   She has used minimal albuterol since her gastric by-pass surgery in July.   She was encourage to continue diet and exercise plan.  Continue current treatment plan at this time. Reviewed results of pulmonary function tests.  Pt verbalized understanding of results.  Written pt instructions provided. Total time in face to face counseling  25 minutes.  Patient seen with Maryanna Shape PharmD, Pharmacy Resident.

## 2012-09-15 NOTE — Telephone Encounter (Signed)
Patient opted to try a different generic omeprazole. PA form discarded.

## 2012-09-18 ENCOUNTER — Ambulatory Visit (INDEPENDENT_AMBULATORY_CARE_PROVIDER_SITE_OTHER): Payer: Medicare PPO | Admitting: Family Medicine

## 2012-09-18 ENCOUNTER — Encounter: Payer: Medicare PPO | Admitting: Pharmacist

## 2012-09-18 ENCOUNTER — Encounter: Payer: Self-pay | Admitting: Pharmacist

## 2012-09-18 ENCOUNTER — Encounter: Payer: Self-pay | Admitting: Family Medicine

## 2012-09-18 VITALS — Temp 98.0°F

## 2012-09-18 DIAGNOSIS — J069 Acute upper respiratory infection, unspecified: Secondary | ICD-10-CM | POA: Insufficient documentation

## 2012-09-18 MED ORDER — BENZONATATE 100 MG PO CAPS: 100.0000 mg | ORAL_CAPSULE | Freq: Three times a day (TID) | ORAL | Status: DC | PRN

## 2012-09-18 NOTE — Progress Notes (Signed)
Patient ID: [name], female   DOB: May 15, 1956, 56 y.o.   MRN: [id number] Subjective:     [name] is a 56 y.o. female who presents for evaluation of symptoms of a URI. Symptoms include congestion, headache described as mild, bilateral frontal, nasal congestion, no  fever and non productive cough. Onset of symptoms was 3 days ago, and has been unchanged since that time. Treatment to date: Alkaseltzer and mucinex.  The cough and congestion are worse at night. Her sister who lives with her also has URI symptoms.    Review of Systems Pertinent items are noted in HPI.   Objective:    Temp 98 F (36.7 C) (Oral) General appearance: alert, cooperative and no distress Eyes: conjunctivae/corneas clear. PERRL, EOM's intact.  Ears: normal TM's and external ear canals both ears Nose: Nares normal. Septum midline. Mucosa normal. No drainage or sinus tenderness., turbinates pink, swollen Throat: lips, mucosa, and tongue normal; teeth and gums normal Neck: no adenopathy, no carotid bruit, no JVD, supple, symmetrical, trachea midline and thyroid not enlarged, symmetric, no tenderness/mass/nodules Lungs: normal work of breathing. speaking in complete sentences.    Assessment:    viral upper respiratory illness   Plan:    Discussed diagnosis and treatment of URI. Suggested symptomatic OTC remedies. Nasal saline spray for congestion. Follow up as needed.

## 2012-09-18 NOTE — Patient Instructions (Addendum)
Deeya,   Thank you for coming in to see me today.  You have a viral URI.  For this: 1. Fluids 2. Rest 3. Use flonase daily  4. Buy nasal saline spray 5. OTC options: alka seltzer, corcidin HBP use for shortest amount of time needed.   If you develop chest pain, worsening headaches or high fevers please come back.   Dr. Armen Pickup

## 2012-09-18 NOTE — Assessment & Plan Note (Signed)
A: Viral upper respiratory illness  P:  Discussed diagnosis and treatment of URI. Suggested symptomatic OTC remedies. Nasal saline spray for congestion. Follow up as needed.

## 2012-09-19 NOTE — Progress Notes (Signed)
Patient ID: [name], female   DOB: 1956/10/15, 56 y.o.   MRN: [id number] Reviewed and agree with Dr. Macky Lower documentation and management.

## 2012-09-21 NOTE — Progress Notes (Signed)
This encounter was created in error - please disregard.

## 2012-09-24 ENCOUNTER — Emergency Department (HOSPITAL_COMMUNITY): Payer: Medicare PPO

## 2012-09-24 ENCOUNTER — Observation Stay (HOSPITAL_COMMUNITY)
Admission: EM | Admit: 2012-09-24 | Discharge: 2012-09-25 | Disposition: A | Discharge status: Home / Self Care | Payer: Medicare PPO | Attending: Family Medicine | Admitting: Family Medicine

## 2012-09-24 ENCOUNTER — Encounter (HOSPITAL_COMMUNITY): Payer: Self-pay | Admitting: Emergency Medicine

## 2012-09-24 DIAGNOSIS — K219 Gastro-esophageal reflux disease without esophagitis: Secondary | ICD-10-CM | POA: Insufficient documentation

## 2012-09-24 DIAGNOSIS — E876 Hypokalemia: Secondary | ICD-10-CM

## 2012-09-24 DIAGNOSIS — K5730 Diverticulosis of large intestine without perforation or abscess without bleeding: Secondary | ICD-10-CM | POA: Insufficient documentation

## 2012-09-24 DIAGNOSIS — I10 Essential (primary) hypertension: Secondary | ICD-10-CM | POA: Insufficient documentation

## 2012-09-24 DIAGNOSIS — IMO0001 Reserved for inherently not codable concepts without codable children: Principal | ICD-10-CM | POA: Insufficient documentation

## 2012-09-24 DIAGNOSIS — R109 Unspecified abdominal pain: Secondary | ICD-10-CM

## 2012-09-24 DIAGNOSIS — Z9884 Bariatric surgery status: Secondary | ICD-10-CM | POA: Insufficient documentation

## 2012-09-24 DIAGNOSIS — Z8673 Personal history of transient ischemic attack (TIA), and cerebral infarction without residual deficits: Secondary | ICD-10-CM | POA: Insufficient documentation

## 2012-09-24 DIAGNOSIS — R002 Palpitations: Secondary | ICD-10-CM | POA: Insufficient documentation

## 2012-09-24 LAB — CBC WITH DIFFERENTIAL/PLATELET
Basophils Absolute: 0 10*3/uL (ref 0.0–0.1)
Basophils Relative: 0 % (ref 0–1)
Eosinophils Absolute: 0.1 10*3/uL (ref 0.0–0.7)
Eosinophils Relative: 2 % (ref 0–5)
HCT: 36.7 % (ref 36.0–46.0)
Hemoglobin: 12.2 g/dL (ref 12.0–15.0)
Lymphocytes Relative: 34 % (ref 12–46)
Lymphs Abs: 2.3 10*3/uL (ref 0.7–4.0)
MCH: 29.4 pg (ref 26.0–34.0)
MCHC: 33.2 g/dL (ref 30.0–36.0)
MCV: 88.4 fL (ref 78.0–100.0)
Monocytes Absolute: 0.5 10*3/uL (ref 0.1–1.0)
Monocytes Relative: 8 % (ref 3–12)
Neutro Abs: 4 10*3/uL (ref 1.7–7.7)
Neutrophils Relative %: 57 % (ref 43–77)
Platelets: 270 10*3/uL (ref 150–400)
RBC: 4.15 MIL/uL (ref 3.87–5.11)
RDW: 14.6 % (ref 11.5–15.5)
WBC: 6.9 10*3/uL (ref 4.0–10.5)

## 2012-09-24 LAB — BASIC METABOLIC PANEL
BUN: 8 mg/dL (ref 6–23)
CO2: 34 mEq/L — ABNORMAL HIGH (ref 19–32)
Calcium: 9.5 mg/dL (ref 8.4–10.5)
Chloride: 98 mEq/L (ref 96–112)
Creatinine, Ser: 0.75 mg/dL (ref 0.50–1.10)
GFR calc Af Amer: >90 mL/min (ref >90)
GFR calc non Af Amer: >90 mL/min (ref >90)
Glucose, Bld: 101 mg/dL — ABNORMAL HIGH (ref 70–99)
Potassium: 2.2 mEq/L — CL (ref 3.5–5.1)
Sodium: 141 mEq/L (ref 135–145)

## 2012-09-24 LAB — URINALYSIS, ROUTINE W REFLEX MICROSCOPIC
Bilirubin Urine: NEGATIVE
Glucose, UA: NEGATIVE mg/dL
Hgb urine dipstick: NEGATIVE
Ketones, ur: NEGATIVE mg/dL
Leukocytes, UA: NEGATIVE
Nitrite: NEGATIVE
Protein, ur: NEGATIVE mg/dL
Specific Gravity, Urine: 1.025 (ref 1.005–1.030)
Urobilinogen, UA: 1 mg/dL (ref 0.0–1.0)
pH: 7.5 (ref 5.0–8.0)

## 2012-09-24 LAB — POCT I-STAT, CHEM 8
BUN: 7 mg/dL (ref 6–23)
Calcium, Ion: 1.13 mmol/L (ref 1.12–1.23)
Chloride: 99 mEq/L (ref 96–112)
Creatinine, Ser: 0.9 mg/dL (ref 0.50–1.10)
Glucose, Bld: 101 mg/dL — ABNORMAL HIGH (ref 70–99)
HCT: 37 % (ref 36.0–46.0)
Hemoglobin: 12.6 g/dL (ref 12.0–15.0)
Potassium: 2.3 mEq/L — CL (ref 3.5–5.1)
Sodium: 143 mEq/L (ref 135–145)
TCO2: 31 mmol/L (ref 0–100)

## 2012-09-24 MED ORDER — POTASSIUM CHLORIDE CRYS ER 20 MEQ PO TBCR
40.0000 meq | EXTENDED_RELEASE_TABLET | Freq: Once | ORAL | Status: AC
  Administered 2012-09-24: 40 meq via ORAL
  Filled 2012-09-24: qty 2

## 2012-09-24 MED ORDER — MORPHINE SULFATE 4 MG/ML IJ SOLN
4.0000 mg | Freq: Once | INTRAMUSCULAR | Status: AC
  Administered 2012-09-24: 4 mg via INTRAVENOUS
  Filled 2012-09-24: qty 1

## 2012-09-24 MED ORDER — SODIUM CHLORIDE 0.9 % IV BOLUS (SEPSIS)
1000.0000 mL | Freq: Once | INTRAVENOUS | Status: AC
  Administered 2012-09-24: 1000 mL via INTRAVENOUS

## 2012-09-24 MED ORDER — ONDANSETRON HCL 4 MG/2ML IJ SOLN
4.0000 mg | Freq: Once | INTRAMUSCULAR | Status: AC
  Administered 2012-09-24: 4 mg via INTRAVENOUS
  Filled 2012-09-24: qty 2

## 2012-09-24 MED ORDER — POTASSIUM CHLORIDE 10 MEQ/100ML IV SOLN
10.0000 meq | INTRAVENOUS | Status: AC
  Administered 2012-09-24 (×3): 10 meq via INTRAVENOUS
  Filled 2012-09-24 (×3): qty 100

## 2012-09-24 MED ORDER — SODIUM CHLORIDE 0.9 % IV SOLN: INTRAVENOUS | Status: DC

## 2012-09-24 MED ORDER — POTASSIUM CHLORIDE 10 MEQ/100ML IV SOLN
10.0000 meq | INTRAVENOUS | Status: DC
  Filled 2012-09-24: qty 100

## 2012-09-24 NOTE — ED Notes (Signed)
MD at bedside. 

## 2012-09-24 NOTE — ED Notes (Signed)
Pt states woke to left flank and side pain yesterday morning around 0500. Denies nausea or having similar pain before. Denies dysuria, hematuria,  but endorses frequency and urgency.

## 2012-09-24 NOTE — ED Provider Notes (Signed)
History     CSN: [id number]  Arrival date & time 09/24/12  1553   First MD Initiated Contact with Patient 09/24/12 1711      Chief Complaint  Patient presents with  . Flank Pain    (Consider location/radiation/quality/duration/timing/severity/associated sxs/prior treatment) HPI  Patient reports yesterday morning at 5 AM she was awakened with pain in her left lateral abdominal side that radiates into her left flank. The pain has been there constantly. She denies nausea or vomiting, fever or chills, dysuria or hematuria, coughing or shortness of breath. She states she does have frequency. She states she had URI symptoms early in the week and still has a mild cough but it's much improved. She describes the pain as sharp and dull. She states sitting up and laying down makes it pain hurt more, nothing makes it feel better. The pain is not pleuritic. She states that the worse the pain is a 10/10, currently it is an 8/10. She denies been a caffeine drinker or a heavy milk drinker. She states she's never had this pain before. She denies any family history of kidney stones.  Patient had gastric bypass surgery done in [place] earlier this summer.  PCP [facility] Dr Armen Pickup  Past Medical History  Diagnosis Date  . Anemia   . Hypertension   . Diverticulosis of colon with hemorrhage 2001    4  previous hospital admissions for bleeding  . Stroke   . Tubular adenoma of colon 2009    s/p biopsy no high grade dysplasia or malignancy identified.   . Cervical cancer screening 12/01/2011  . VENOUS INSUFFICIENCY, CHRONIC 12/29/2006    Qualifier: Diagnosis of  By: Bebe Shaggy    . Plantar fasciitis 01/28/2011  . Muscle cramps at night 02/23/2011  . H. pylori infection 02/09/2012    Diagnosed at [facility]. Treated with prevpac   . FOOT PAIN, BILATERAL 04/07/2010    Qualifier: Diagnosis of  By: Sandi Mealy  MD, [name] Cornfield    . 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 CHEST PAIN, ATYPICAL 10/13/2009    Qualifier: Diagnosis of  By: Sandi Mealy  MD, [name] Cornfield      Past Surgical History  Procedure Date  . Abdominal hysterectomy   . Laparoscopic hysterectomy 1977    left ovaries, took uterus, no cervix on exam (1/13)  . Knee replacements 2003  . 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 Cancer Father 34    Died from complications of colon CA  . Diabetes Mother   . Hypertension Mother   . Diabetes Sister   . Heart disease Sister   . Diabetes Sister   . Stroke Sister   . Diabetes Brother   . Stroke Brother   . Diabetes Brother   . Diabetes Brother   . Diabetes Brother     History  Substance Use Topics  . Smoking status: Never Smoker   . Smokeless tobacco: Never Used  . Alcohol Use: No  employed  OB History    Grav Para Term Preterm Abortions TAB SAB Ect Mult Living                  Review of Systems  All other systems reviewed and are negative.    Allergies  Aspirin and Nsaids  Home Medications   Current Outpatient Rx  Name  Route  Sig  Dispense  Refill  .  ACETAMINOPHEN ER 650 MG PO TBCR   Oral   Take 1 tablet (650 mg total) by mouth 2 (two) times daily as needed. As needed for pain. May take 3 times a day if needed   90 tablet   2   . ALBUTEROL SULFATE HFA 108 (90 BASE) MCG/ACT IN AERS   Inhalation   Inhale 2 puffs into the lungs every 4 (four) hours as needed for wheezing or shortness of breath.   1 Inhaler   1   . B COMPLEX PO TABS   Oral   Take 1 tablet by mouth daily.   90 tablet   2   . BENZONATATE 100 MG PO CAPS   Oral   Take 1 capsule (100 mg total) by mouth 3 (three) times daily as needed for cough.   20 capsule   0   . CALCIUM CARBONATE 1250 MG PO CHEW   Oral   Chew 1 tablet by mouth daily.         Marland Kitchen CLONIDINE HCL 0.2 MG PO TABS   Oral   Take 1 tablet (0.2 mg total) by mouth 2 (two) times daily.   180 tablet   3   . DOCUSATE SODIUM  100 MG PO CAPS   Oral   Take 100 mg by mouth 2 (two) times daily.         Marland Kitchen FERROUS SULFATE 325 (65 FE) MG PO TABS   Oral   Take 325 mg by mouth 2 (two) times daily.         Marland Kitchen FEXOFENADINE HCL 180 MG PO TABS   Oral   Take 180 mg by mouth daily.           Marland Kitchen FLUTICASONE PROPIONATE 50 MCG/ACT NA SUSP   Nasal   Place 2 sprays into the nose daily. 2 sprays in each nostril in am after nasal rinse   16 g   11   . HYDROCHLOROTHIAZIDE 12.5 MG PO CAPS   Oral   Take 1 capsule (12.5 mg total) by mouth daily.   90 capsule   3   . ISOSORBIDE DINITRATE 20 MG PO TABS   Oral   Take 20 mg by mouth 2 (two) times daily.         Marland Kitchen LISINOPRIL 20 MG PO TABS   Oral   Take 1 tablet (20 mg total) by mouth daily.   30 tablet   3   . NITROGLYCERIN 0.4 MG SL SUBL   Sublingual   Place 0.4 mg under the tongue every 5 (five) minutes as needed. If 3 doses does not relieve pain, call 911.         Marland Kitchen OMEPRAZOLE 40 MG PO CPDR   Oral   Take 1 capsule (40 mg total) by mouth daily. Please use a generic different than previous.   30 capsule   0     Brand name only.   Marland Kitchen POLYETHYLENE GLYCOL 3350 PO POWD   Oral   Take 17 g by mouth daily.   255 g   11   . URSODIOL 300 MG PO CAPS               . ZOLPIDEM TARTRATE 10 MG PO TABS      Take 1/2 to 1 tab at bedtime as needed for insomnia   90 tablet   0     BP 133/85  Pulse 80  Temp 98.7 F (37.1 C) (Oral)  Resp 16  SpO2 99%  Vital signs normal    Physical Exam  Nursing note and vitals reviewed. Constitutional: She is oriented to person, place, and time. She appears well-developed and well-nourished.  Non-toxic appearance. She does not appear ill. She appears distressed.  HENT:  Head: Normocephalic and atraumatic.  Right Ear: External ear normal.  Left Ear: External ear normal.  Nose: Nose normal. No mucosal edema or rhinorrhea.  Mouth/Throat: Oropharynx is clear and moist and mucous membranes are normal. No dental  abscesses or uvula swelling.  Eyes: Conjunctivae normal and EOM are normal. Pupils are equal, round, and reactive to light.  Neck: Normal range of motion and full passive range of motion without pain. Neck supple.  Cardiovascular: Normal rate, regular rhythm and normal heart sounds.  Exam reveals no gallop and no friction rub.   No murmur heard. Pulmonary/Chest: Effort normal and breath sounds normal. No respiratory distress. She has no wheezes. She has no rhonchi. She has no rales. She exhibits no tenderness and no crepitus.  Abdominal: Soft. Normal appearance and bowel sounds are normal. She exhibits no distension. There is no tenderness. There is no rebound and no guarding.  Genitourinary:       Nontender in her left flank however she indicates that is where her pain is located  Musculoskeletal: Normal range of motion. She exhibits no edema and no tenderness.       Moves all extremities well.   Neurological: She is alert and oriented to person, place, and time. She has normal strength. No cranial nerve deficit.  Skin: Skin is warm, dry and intact. No rash noted. No erythema. No pallor.  Psychiatric: She has a normal mood and affect. Her speech is normal and behavior is normal. Her mood appears not anxious.    ED Course  Procedures (including critical care time)   Medications  potassium chloride 10 mEq in 100 mL IVPB (10 mEq Intravenous New Bag/Given 09/24/12 2007)  potassium chloride 10 mEq in 100 mL IVPB (0 mEq Intravenous Duplicate 09/24/12 1942)  morphine 4 MG/ML injection 4 mg (4 mg Intravenous Given 09/24/12 1754)  ondansetron (ZOFRAN) injection 4 mg (4 mg Intravenous Given 09/24/12 1754)  potassium chloride SA (K-DUR,KLOR-CON) CR tablet 40 mEq (40 mEq Oral Given 09/24/12 1846)  sodium chloride 0.9 % bolus 1,000 mL (1000 mL Intravenous New Bag/Given 09/24/12 1942)  potassium chloride SA (K-DUR,KLOR-CON) CR tablet 40 mEq (40 mEq Oral Given 09/24/12 1942)   Patient is relieved that  there is no kidney stone or other obvious source for her pain. She was started on oral and IV potassium for her severe hypokalemia.  20:38 Dr Konrad Dolores accepts in transfer to [facility] tele for Dr Deirdre Priest   Results for orders placed during the hospital encounter of 09/24/12  CBC WITH DIFFERENTIAL      Component Value Range   WBC 6.9  4.0 - 10.5 K/uL   RBC 4.15  3.87 - 5.11 MIL/uL   Hemoglobin 12.2  12.0 - 15.0 g/dL   HCT 11.9  14.7 - 82.9 %   MCV 88.4  78.0 - 100.0 fL   MCH 29.4  26.0 - 34.0 pg   MCHC 33.2  30.0 - 36.0 g/dL   RDW 56.2  13.0 - 86.5 %   Platelets 270  150 - 400 K/uL   Neutrophils Relative 57  43 - 77 %   Neutro Abs 4.0  1.7 - 7.7 K/uL   Lymphocytes Relative 34  12 - 46 %   Lymphs  Abs 2.3  0.7 - 4.0 K/uL   Monocytes Relative 8  3 - 12 %   Monocytes Absolute 0.5  0.1 - 1.0 K/uL   Eosinophils Relative 2  0 - 5 %   Eosinophils Absolute 0.1  0.0 - 0.7 K/uL   Basophils Relative 0  0 - 1 %   Basophils Absolute 0.0  0.0 - 0.1 K/uL  BASIC METABOLIC PANEL      Component Value Range   Sodium 141  135 - 145 mEq/L   Potassium 2.2 (*) 3.5 - 5.1 mEq/L   Chloride 98  96 - 112 mEq/L   CO2 34 (*) 19 - 32 mEq/L   Glucose, Bld 101 (*) 70 - 99 mg/dL   BUN 8  6 - 23 mg/dL   Creatinine, Ser 4.09  0.50 - 1.10 mg/dL   Calcium 9.5  8.4 - 81.1 mg/dL   GFR calc non Af Amer >90  >90 mL/min   GFR calc Af Amer >90  >90 mL/min  URINALYSIS, ROUTINE W REFLEX MICROSCOPIC      Component Value Range   Color, Urine YELLOW  YELLOW   APPearance CLEAR  CLEAR   Specific Gravity, Urine 1.025  1.005 - 1.030   pH 7.5  5.0 - 8.0   Glucose, UA NEGATIVE  NEGATIVE mg/dL   Hgb urine dipstick NEGATIVE  NEGATIVE   Bilirubin Urine NEGATIVE  NEGATIVE   Ketones, ur NEGATIVE  NEGATIVE mg/dL   Protein, ur NEGATIVE  NEGATIVE mg/dL   Urobilinogen, UA 1.0  0.0 - 1.0 mg/dL   Nitrite NEGATIVE  NEGATIVE   Leukocytes, UA NEGATIVE  NEGATIVE  POCT I-STAT, CHEM 8      Component Value Range   Sodium 143  135 - 145 mEq/L     Potassium 2.3 (*) 3.5 - 5.1 mEq/L   Chloride 99  96 - 112 mEq/L   BUN 7  6 - 23 mg/dL   Creatinine, Ser 9.14  0.50 - 1.10 mg/dL   Glucose, Bld 782 (*) 70 - 99 mg/dL   Calcium, Ion 9.56  2.13 - 1.23 mmol/L   TCO2 31  0 - 100 mmol/L   Hemoglobin 12.6  12.0 - 15.0 g/dL   HCT 08.6  57.8 - 46.9 %   Comment NOTIFIED PHYSICIAN     Laboratory interpretation all normal except hypokalemia   Ct Abdomen Pelvis Wo Contrast  09/24/2012  *RADIOLOGY REPORT*  Clinical Data: Left-sided flank pain.  CT ABDOMEN AND PELVIS WITHOUT CONTRAST  Technique:  Multidetector CT imaging of the abdomen and pelvis was performed following the standard protocol without intravenous contrast.  Comparison: CT of the abdomen and pelvis 08/24/2007.  Findings:  Lung Bases: Calcifications of the mitral annulus.  Otherwise, unremarkable.  Abdomen/Pelvis:  No definite abnormal calcifications within the collecting system of either kidney, along the course of either ureter, or within the lumen of the urinary bladder to suggest abnormal urinary tract calculi.  No hydroureteronephrosis to suggest urinary tract obstruction at this time.  There is mild indistinctness of the peripelvic fat the left kidney, which can be seen in the setting of upper tract urinary tract infection.  1 cm low attenuation lesion extending exophytically from the upper pole of the left kidney is unchanged.  Clinical correlation is recommended.  3 cm low attenuation lesion in segment two of the liver is slightly larger than the prior examination, but likely represent a cyst.  A small amount of intermediate attenuation material layering dependently in the  gallbladder lumen, likely represents some biliary sludge.  No signs to suggest acute cholecystitis at this time.  Postoperative changes of gastric bypass procedure are noted. The unenhanced appearance of the pancreas, spleen and bilateral adrenal glands is unremarkable.  There is extensive colonic diverticulosis, without  definite findings to suggest an acute diverticulitis at this time.  Normal appendix.  Trace amount of ascites in the pelvis.  Status post hysterectomy.  Ovaries are not confidently identified, and may be surgically absent or atrophic. Urinary bladder is nearly completely decompressed.  Numerous pelvic phleboliths are incidentally noted.  Musculoskeletal: There are no aggressive appearing lytic or blastic lesions noted in the visualized portions of the skeleton.  IMPRESSION: 1.  No definite acute findings in the abdomen or pelvis to account for the patient's symptoms.  Specifically, no abnormal urinary tract calculi or findings of urinary tract obstruction. 2.  However, there is very subtle indistinctness of the fat in the left renal hilum, which could be seen in the setting of pyelonephritis.  Clinical correlation is recommended. 3.  Extensive colonic diverticulosis without findings to suggest acute diverticulitis at this time.  4.  Slight enlargement of a 3 cm low attenuation lesion in segment 2 of the liver, which likely represent a large cyst. 5.  Indeterminate low attenuation lesion extending slightly exophytically from the upper pole of the left kidney is unchanged compared to prior study, likely represent a cyst. 6.  Status post gastric bypass.   Original Report Authenticated By: Trudie Reed, M.D.     Date: 09/24/2012  Rate: 70  Rhythm: normal sinus rhythm  QRS Axis: normal  Intervals: normal  ST/T Wave abnormalities: normal  Conduction Disutrbances:LVH  Narrative Interpretation:   Old EKG Reviewed: unchanged from 07/02/2011    1. Flank pain   2. Hypokalemia     Plan transfer to [facility] for admission to treat her hypokalemia  Devoria Albe, MD, FACEP   MDM          Ward Givens, MD 09/24/12 2041

## 2012-09-24 NOTE — H&P (Signed)
Family Medicine Teaching [facility] Admission History and Physical Service Pager: [phone]  [name] Medical record number: [id number] Date of birth: 11-10-1955 Age: 56 y.o. Gender: female  Primary Care Provider: Dessa Phi, MD  Chief Complaint: abdominal pain  Assessment and Plan: [name] is a 56 y.o. year old female with PMH of HTN, diverticulosis, CVA , GERD, S/p gastric bypass here with  Abdominal pain, hypokalemia, and palpitations.   Abdominal/Flank pain - In light of our exam we feel that a musculoskeletal source is most likely. Other DDx includes renal stone, UTI, pyelonephritis, diverticulitis, renal cyst, or viral gastroenteritis.  - Her Ct abdomen rules out diverticulitis and did not appreciate a renal stone.  It does show changes in the L kidney that could be due to pyelonephritis, but without fever or WBC that is unlikely. Also her renal cyst is stable from the previosu study and so is not likely the source either.  - Her UA does not show signs of infection - Viral gastro is also less likely without any nausea, vomiting or diarrhea.  - Tylenol, ultram, and skelaxin for pain control as she has a Hx of bleed assoc with NSADs.  - re-check in the am.   Heart flutter - Onset 1 month ago, no EKG evidence, Heart with RRR on exam.  - possibly associated with isosorbide dinitrate and also possibly assoc with hypokalemia.  - Since July her potassium has been running normal to low around 3.5, today it is severely low at 2.2 - Possibly due to hypokalemia that started after her gastric bypass.  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  - recheck BMP in the Am  HTN - slightly elevated as high as 148/96 - Continue home lisinopril, isosorbide dinitrate, HCTZ, and clonidine -  Monitor  GERD - Continue PPI and monitor  FEN/GI: Carb modified, NS with 40 KCl at 100/hr Prophylaxis: Lovenox Disposition: Home pending improvement or explanation of pain Code Status: Full for now, will clarify in the am.   History of Present Illness: [name] is a 56 y.o. year old female presenting with abdominal pain that started 2 days ago at 5 am when it woke her from sleep. Described as constant sharp and dull pain greatest in the L flank radiating to the front. It's worsened with movement and turning. Says that she can't lay comfortably on her back. Resolved with sitting still and not exacerbated by eating. She's never had this pain before before. She denies trauma, fevers, nasuea, diarrhea. Had a cold 1 week ago that has now resolved. She has approx 2 BMs per day, not constipated now but has had loose stools since last night. She denies new medications, Hx of chickenpox, and rash. No dysuria, has had some polyuria for about 1 day.   She has been having a heart flutter which started about 1 month ago, she thought it would go away when she last saw her PCP. It does not feel like the chest pain or GERD that she used to have. The heart flutter happens at night when she takes her night medicine. No associated chest pain, sweats, or other concerns.   Patient Active Problem List  Diagnosis  . 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 OBESITY, NOS  .  ANEMIA, IRON DEFICIENCY, UNSPEC.  Marland Kitchen HTN controlled.   . ALLERGIC RHINITIS CAUSE UNSPECIFIED  . CYST, KIDNEY, ACQUIRED  . LIBIDO, DECREASED  . CEREBROVASCULAR ACCIDENT, HX OF  . POSTMENOPAUSAL STATUS  . DE QUERVAIN'S TENOSYNOVITIS  . Hypopigmented skin lesion  . Diverticulosis of colon with hemorrhage  . Asthma, cough variant  . S/P gastric bypass  . GERD (gastroesophageal reflux disease)  . Insomnia  . Viral URI with cough   Past Medical History: Past Medical History  Diagnosis Date  . Anemia   . Hypertension   .  Diverticulosis of colon with hemorrhage 2001    4  previous hospital admissions for bleeding  . Stroke   . Tubular adenoma of colon 2009    s/p biopsy no high grade dysplasia or malignancy identified.   . Cervical cancer screening 12/01/2011  . VENOUS INSUFFICIENCY, CHRONIC 12/29/2006    Qualifier: Diagnosis of  By: Bebe Shaggy    . Plantar fasciitis 01/28/2011  . Muscle cramps at night 02/23/2011  . H. pylori infection 02/09/2012    Diagnosed at [facility]. Treated with prevpac   . FOOT PAIN, BILATERAL 04/07/2010    Qualifier: Diagnosis of  By: Sandi Mealy  MD, [name] Cornfield    . 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 CHEST PAIN, ATYPICAL 10/13/2009    Qualifier: Diagnosis of  By: Sandi Mealy  MD, [name] Cornfield     Past Surgical History: Past Surgical History  Procedure Date  . Abdominal hysterectomy   . Laparoscopic hysterectomy 1977    left ovaries, took uterus, no cervix on exam (1/13)  . Knee replacements 2003  . 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 Smoking status: Never Smoker   . Smokeless tobacco: Never Used  . Alcohol Use: No   For any additional social history documentation, please refer to relevant sections of EMR.  Family History: Family History  Problem Relation Age of Onset  . Cancer Father 66    Died from complications of colon CA  . Diabetes Mother   . Hypertension Mother   . Diabetes Sister   . Heart disease Sister   . Diabetes Sister   . Stroke Sister   . Diabetes Brother   . Stroke Brother   . Diabetes Brother   . Diabetes Brother   . Diabetes Brother    Allergies: Allergies  Allergen Reactions  . Aspirin Other (See Comments)    REACTION: GI BLEED !  AVOID NSAID'S As well  Was taking ibuprofen at the same time.   . Nsaids Other (See Comments)    REACTION: GI bleed Second time with    No current facility-administered medications on file prior to encounter.   Current Outpatient  Prescriptions on File Prior to Encounter  Medication Sig Dispense Refill  . acetaminophen (TYLENOL) 650 MG CR tablet Take 1 tablet (650 mg total) by mouth 2 (two) times daily as needed. As needed for pain. May take 3 times a day if needed  90 tablet  2  . albuterol (PROVENTIL HFA;VENTOLIN HFA) 108 (90 BASE) MCG/ACT inhaler Inhale 2 puffs into the lungs every 4 (four) hours as needed for wheezing or shortness of breath.  1 Inhaler  1  . b complex vitamins tablet Take 1 tablet by mouth daily.  90 tablet  2  . benzonatate (TESSALON) 100 MG capsule Take 1 capsule (100 mg total) by mouth 3 (three) times daily as  needed for cough.  20 capsule  0  . calcium carbonate (OS-CAL) 1250 MG chewable tablet Chew 1 tablet by mouth daily.      . cloNIDine (CATAPRES) 0.2 MG tablet Take 1 tablet (0.2 mg total) by mouth 2 (two) times daily.  180 tablet  3  . docusate sodium (COLACE) 100 MG capsule Take 100 mg by mouth 2 (two) times daily.      . ferrous sulfate 325 (65 FE) MG tablet Take 325 mg by mouth 2 (two) times daily.      . fexofenadine (ALLEGRA) 180 MG tablet Take 180 mg by mouth daily.        . fluticasone (FLONASE) 50 MCG/ACT nasal spray Place 2 sprays into the nose daily. 2 sprays in each nostril in am after nasal rinse  16 g  11  . hydrochlorothiazide (MICROZIDE) 12.5 MG capsule Take 1 capsule (12.5 mg total) by mouth daily.  90 capsule  3  . isosorbide dinitrate (ISORDIL) 20 MG tablet Take 20 mg by mouth 2 (two) times daily.      Marland Kitchen lisinopril (PRINIVIL,ZESTRIL) 20 MG tablet Take 1 tablet (20 mg total) by mouth daily.  30 tablet  3  . nitroGLYCERIN (NITROSTAT) 0.4 MG SL tablet Place 0.4 mg under the tongue every 5 (five) minutes as needed. If 3 doses does not relieve pain, call 911.      . omeprazole (PRILOSEC) 40 MG capsule Take 1 capsule (40 mg total) by mouth daily. Please use a generic different than previous.  30 capsule  0  . polyethylene glycol powder (GLYCOLAX/MIRALAX) powder Take 17 g by mouth  daily.  255 g  11  . ursodiol (ACTIGALL) 300 MG capsule       . zolpidem (AMBIEN) 10 MG tablet Take 1/2 to 1 tab at bedtime as needed for insomnia  90 tablet  0   Review Of Systems: Per HPI, Otherwise 12 point review of systems was performed and was unremarkable.  Physical Exam: BP 145/89  Pulse 88  Temp 97.9 F (36.6 C) (Oral)  Resp 18  Ht 5\' 4"  (1.626 m)  Wt 234 lb (106.142 kg)  BMI 40.17 kg/m2  SpO2 95% Exam: Gen: NAD, alert, cooperative with exam HEENT: NCAT, EOMI CV: RRR, good S1/S2, no murmur Resp: CTABL, no wheezes, non-labored Abd: Soft, mildly tender to deep palpation with no guarding or rebound, well healed surgical scars MSK: Point tenderness in mid thoracic region along the ribs from L back that extends to the anterior chest just inferior to the L breast.  Ext: No edema noted, warm Neuro: Alert and oriented, No gross deficits Skin: no rash on back or abdomen    Labs and Imaging: CBC BMET   Lab 09/24/12 1809 09/24/12 1714  WBC -- 6.9  HGB 12.6 --  HCT 37.0 --  PLT -- 270    Lab 09/24/12 1809 09/24/12 1714  NA 143 --  K 2.3* --  CL 99 --  CO2 -- 34*  BUN 7 --  CREATININE 0.90 --  GLUCOSE 101* --  CALCIUM -- 9.5     Urinalysis    Component Value Date/Time   COLORURINE YELLOW 09/24/2012 1727   APPEARANCEUR CLEAR 09/24/2012 1727   LABSPEC 1.025 09/24/2012 1727   PHURINE 7.5 09/24/2012 1727   GLUCOSEU NEGATIVE 09/24/2012 1727   HGBUR NEGATIVE 09/24/2012 1727   HGBUR negative 08/26/2010 1355   BILIRUBINUR NEGATIVE 09/24/2012 1727   BILIRUBINUR NEG 03/20/2012 1106   KETONESUR NEGATIVE 09/24/2012 1727  PROTEINUR NEGATIVE 09/24/2012 1727   UROBILINOGEN 1.0 09/24/2012 1727   UROBILINOGEN 0.2 03/20/2012 1106   NITRITE NEGATIVE 09/24/2012 1727   NITRITE NEG 03/20/2012 1106   LEUKOCYTESUR NEGATIVE 09/24/2012 1727   CT Abd/pelvis Without 09/24/2012 IMPRESSION:  1. No definite acute findings in the abdomen or pelvis to account  for the patient's  symptoms. Specifically, no abnormal urinary  tract calculi or findings of urinary tract obstruction.  2. However, there is very subtle indistinctness of the fat in the  left renal hilum, which could be seen in the setting of  pyelonephritis. Clinical correlation is recommended.  3. Extensive colonic diverticulosis without findings to suggest  acute diverticulitis at this time.  4. Slight enlargement of a 3 cm low attenuation lesion in segment  2 of the liver, which likely represent a large cyst.  5. Indeterminate low attenuation lesion extending slightly  exophytically from the upper pole of the left kidney is unchanged  compared to prior study, likely represent a cyst.  6. Status post gastric bypass.   Kevin Fenton, MD 09/24/2012, 11:12 PM   ------------------------------------------------------------------------------------------------------------------------- ------------------------------------------------------------------------------------------------------------------------  Upper level addendum  I have examined the patient and have read and agree with the above note   PE:  Gen: NAD, obese HEENT; MMM, EOMI, PERRL CV: RRR, no m/r/g Res: CTAB, normal effort Abd: NABS, mild tenderness to deep palpation diffusely. No point tenderness, negative Murphy's sign, no pain at Mcburny's point Skin: Intact, warm well perfused, no rashes Musc: Areas of point tenderness along the mid thoracic region of the back traversing laterally and anteriorly to just inferior to the L breast Neuro: CN grossly intact Psych: Pleasant, Affect normal  [name] is a 56 y.o. year old female with PMH of HTN, diverticulosis, CVA , GERD, S/p gastric bypass here with  Abdominal pain, hypokalemia, and palpitations.   Abdominal pain: more accurately this is thoracic cage pain that appears to be musculoskeletal in nature. Pt w/ 85lb wt loss since July and w/ increased activity level over this time.   Possibility for intraabdominal process but CT non-specific.  - Tylenol, ultram, and skelaxin for pain control as she has a Hx of bleed assoc with NSADs.  - Likely to improve w/ rest and muscle relaxors and pain medicaitons  HypoK: K has been trending down ever since gastric bypass surgery. Likely from malabsorption. No other immediate cause evident, as there have been no chang in medications and no recent illnesses. Pt w/o cardiac complaints at this time and EKG normal.  - Continue repletion as outlined above - Will check Mg - Hold HCTZ  Heart flutter feeling: Unsure whether this is truly cardiac in nature. Reflux vs, arrhythmia vs psychosomatic. Only  - Tele - replete K   Other mgt as outlined above  Shelly Flatten, MD Family Medicine PGY-2 09/25/2012, 7:24 AM

## 2012-09-24 NOTE — ED Notes (Signed)
Consulted with EDP about dosage of Potassium ordered. EDP informed me that it was a duplicate order.

## 2012-09-24 NOTE — ED Notes (Signed)
[facility] at bedside 

## 2012-09-24 NOTE — ED Notes (Signed)
Attempted to give report. [facility] [place] informed me that bed placement for pt states [employer]. Bed placement working to verify this.

## 2012-09-24 NOTE — ED Notes (Signed)
[facility] contacted 

## 2012-09-25 ENCOUNTER — Encounter (HOSPITAL_COMMUNITY): Payer: Self-pay | Admitting: *Deleted

## 2012-09-25 DIAGNOSIS — R109 Unspecified abdominal pain: Secondary | ICD-10-CM

## 2012-09-25 LAB — BASIC METABOLIC PANEL
BUN: 8 mg/dL (ref 6–23)
BUN: 6 mg/dL (ref 6–23)
CO2: 30 mEq/L (ref 19–32)
CO2: 30 mEq/L (ref 19–32)
Calcium: 8.6 mg/dL (ref 8.4–10.5)
Calcium: 9.1 mg/dL (ref 8.4–10.5)
Chloride: 105 mEq/L (ref 96–112)
Chloride: 106 mEq/L (ref 96–112)
Creatinine, Ser: 0.65 mg/dL (ref 0.50–1.10)
Creatinine, Ser: 0.62 mg/dL (ref 0.50–1.10)
GFR calc Af Amer: >90 mL/min (ref >90)
GFR calc Af Amer: >90 mL/min (ref >90)
GFR calc non Af Amer: >90 mL/min (ref >90)
GFR calc non Af Amer: >90 mL/min (ref >90)
Glucose, Bld: 100 mg/dL — ABNORMAL HIGH (ref 70–99)
Glucose, Bld: 87 mg/dL (ref 70–99)
Potassium: 3.1 mEq/L — ABNORMAL LOW (ref 3.5–5.1)
Potassium: 4 mEq/L (ref 3.5–5.1)
Sodium: 142 mEq/L (ref 135–145)
Sodium: 143 mEq/L (ref 135–145)

## 2012-09-25 LAB — CBC
HCT: 31.5 % — ABNORMAL LOW (ref 36.0–46.0)
Hemoglobin: 10.5 g/dL — ABNORMAL LOW (ref 12.0–15.0)
MCH: 29.6 pg (ref 26.0–34.0)
MCHC: 33.3 g/dL (ref 30.0–36.0)
MCV: 88.7 fL (ref 78.0–100.0)
Platelets: 258 10*3/uL (ref 150–400)
RBC: 3.55 MIL/uL — ABNORMAL LOW (ref 3.87–5.11)
RDW: 14.6 % (ref 11.5–15.5)
WBC: 6.3 10*3/uL (ref 4.0–10.5)

## 2012-09-25 LAB — MAGNESIUM: Magnesium: 2.1 mg/dL (ref 1.5–2.5)

## 2012-09-25 MED ORDER — ENOXAPARIN SODIUM 40 MG/0.4ML ~~LOC~~ SOLN
40.0000 mg | SUBCUTANEOUS | Status: DC
  Administered 2012-09-25: 40 mg via SUBCUTANEOUS
  Filled 2012-09-25: qty 0.4

## 2012-09-25 MED ORDER — METAXALONE 400 MG HALF TABLET
400.0000 mg | ORAL_TABLET | Freq: Three times a day (TID) | ORAL | Status: DC
  Administered 2012-09-25 (×3): 400 mg via ORAL
  Filled 2012-09-25 (×4): qty 1

## 2012-09-25 MED ORDER — POTASSIUM CHLORIDE CRYS ER 20 MEQ PO TBCR
40.0000 meq | EXTENDED_RELEASE_TABLET | Freq: Once | ORAL | Status: AC
  Administered 2012-09-25: 40 meq via ORAL

## 2012-09-25 MED ORDER — LISINOPRIL 20 MG PO TABS
20.0000 mg | ORAL_TABLET | Freq: Every day | ORAL | Status: DC
  Administered 2012-09-25: 20 mg via ORAL
  Filled 2012-09-25: qty 1

## 2012-09-25 MED ORDER — ZOLPIDEM TARTRATE 5 MG PO TABS
5.0000 mg | ORAL_TABLET | Freq: Every evening | ORAL | Status: DC | PRN
  Administered 2012-09-25: 5 mg via ORAL
  Filled 2012-09-25: qty 1

## 2012-09-25 MED ORDER — ALBUTEROL SULFATE HFA 108 (90 BASE) MCG/ACT IN AERS: 2.0000 | INHALATION_SPRAY | RESPIRATORY_TRACT | Status: DC | PRN

## 2012-09-25 MED ORDER — SODIUM CHLORIDE 0.9 % IJ SOLN: 3.0000 mL | INTRAMUSCULAR | Status: DC | PRN

## 2012-09-25 MED ORDER — ACETAMINOPHEN 650 MG RE SUPP: 650.0000 mg | Freq: Four times a day (QID) | RECTAL | Status: DC | PRN

## 2012-09-25 MED ORDER — MAGNESIUM SULFATE 40 MG/ML IJ SOLN
2.0000 g | Freq: Once | INTRAMUSCULAR | Status: DC
  Filled 2012-09-25: qty 50

## 2012-09-25 MED ORDER — SODIUM CHLORIDE 0.9 % IJ SOLN: 3.0000 mL | Freq: Two times a day (BID) | INTRAMUSCULAR | Status: DC

## 2012-09-25 MED ORDER — POTASSIUM CHLORIDE CRYS ER 20 MEQ PO TBCR
40.0000 meq | EXTENDED_RELEASE_TABLET | Freq: Two times a day (BID) | ORAL | Status: DC
  Administered 2012-09-25 (×2): 40 meq via ORAL
  Filled 2012-09-25 (×4): qty 2

## 2012-09-25 MED ORDER — LORATADINE 10 MG PO TABS
10.0000 mg | ORAL_TABLET | Freq: Every day | ORAL | Status: DC
  Administered 2012-09-25: 10 mg via ORAL
  Filled 2012-09-25: qty 1

## 2012-09-25 MED ORDER — SODIUM CHLORIDE 0.9 % IV SOLN: 250.0000 mL | INTRAVENOUS | Status: DC | PRN

## 2012-09-25 MED ORDER — PANTOPRAZOLE SODIUM 40 MG PO TBEC
40.0000 mg | DELAYED_RELEASE_TABLET | Freq: Every day | ORAL | Status: DC
  Administered 2012-09-25: 40 mg via ORAL
  Filled 2012-09-25: qty 1

## 2012-09-25 MED ORDER — ACETAMINOPHEN 325 MG PO TABS: 650.0000 mg | ORAL_TABLET | Freq: Four times a day (QID) | ORAL | Status: DC | PRN

## 2012-09-25 MED ORDER — ISOSORBIDE DINITRATE 20 MG PO TABS
20.0000 mg | ORAL_TABLET | Freq: Two times a day (BID) | ORAL | Status: DC
  Administered 2012-09-25 (×2): 20 mg via ORAL
  Filled 2012-09-25 (×3): qty 1

## 2012-09-25 MED ORDER — SODIUM CHLORIDE 0.9 % IV SOLN
INTRAVENOUS | Status: DC
  Administered 2012-09-25: 02:00:00 via INTRAVENOUS
  Filled 2012-09-25 (×3): qty 1000

## 2012-09-25 MED ORDER — URSODIOL 300 MG PO CAPS
300.0000 mg | ORAL_CAPSULE | Freq: Every day | ORAL | Status: DC
  Administered 2012-09-25: 300 mg via ORAL
  Filled 2012-09-25: qty 1

## 2012-09-25 MED ORDER — CLONIDINE HCL 0.2 MG PO TABS
0.2000 mg | ORAL_TABLET | Freq: Two times a day (BID) | ORAL | Status: DC
  Administered 2012-09-25 (×2): 0.2 mg via ORAL
  Filled 2012-09-25 (×3): qty 1

## 2012-09-25 MED ORDER — NITROGLYCERIN 0.4 MG SL SUBL: 0.4000 mg | SUBLINGUAL_TABLET | SUBLINGUAL | Status: DC | PRN

## 2012-09-25 MED ORDER — FERROUS SULFATE 325 (65 FE) MG PO TABS
325.0000 mg | ORAL_TABLET | Freq: Two times a day (BID) | ORAL | Status: DC
  Administered 2012-09-25: 325 mg via ORAL
  Filled 2012-09-25 (×2): qty 1

## 2012-09-25 MED ORDER — TRAMADOL HCL 50 MG PO TABS: 50.0000 mg | ORAL_TABLET | Freq: Four times a day (QID) | ORAL | Status: DC | PRN

## 2012-09-25 MED ORDER — METAXALONE 400 MG HALF TABLET: 800.0000 mg | ORAL_TABLET | Freq: Three times a day (TID) | ORAL | Status: DC

## 2012-09-25 MED ORDER — HYDROCHLOROTHIAZIDE 12.5 MG PO CAPS
12.5000 mg | ORAL_CAPSULE | Freq: Every day | ORAL | Status: DC
  Filled 2012-09-25: qty 1

## 2012-09-25 MED ORDER — TRAMADOL HCL 50 MG PO TABS
50.0000 mg | ORAL_TABLET | Freq: Four times a day (QID) | ORAL | Status: DC | PRN
  Administered 2012-09-25: 50 mg via ORAL
  Filled 2012-09-25: qty 1

## 2012-09-25 NOTE — Discharge Summary (Signed)
Family Medicine Teaching [facility] Discharge Summary  [name] Medical record number: [id number] Date of birth: 07-Apr-1956 Age: 56 y.o. Gender: female Date of Admission: 09/24/2012  Date of Discharge: 09/25/2012 Admitting Physician: Ozella Rocks, MD  Primary Care Provider: Dessa Phi, MD  Indication for Hospitalization: Abdominal pain and hypokalemia Discharge Diagnoses:  1. Musculoskeletal pain 2. Hypokalemia 3. HTN 4. Palpitations 5. GERD  Consultations: None  Significant Labs and Imaging:   CT Abd/pelvis Without 09/24/2012  IMPRESSION:  1. No definite acute findings in the abdomen or pelvis to account  for the patient's symptoms. Specifically, no abnormal urinary  tract calculi or findings of urinary tract obstruction.  2. However, there is very subtle indistinctness of the fat in the  left renal hilum, which could be seen in the setting of  pyelonephritis. Clinical correlation is recommended.  3. Extensive colonic diverticulosis without findings to suggest  acute diverticulitis at this time.  4. Slight enlargement of a 3 cm low attenuation lesion in segment  2 of the liver, which likely represent a large cyst.  5. Indeterminate low attenuation lesion extending slightly  exophytically from the upper pole of the left kidney is unchanged  compared to prior study, likely represent a cyst.  6. Status post gastric bypass.  Procedures: None  Brief Hospital Course:  [name] is a 56 y.o. year old female with PMH of HTN, diverticulosis, CVA , GERD, S/p gastric bypass here with flank pain, hypokalemia, and palpitations.   Flank pain  With exacerbation with movement and point tenderness along the rib line starting in the back and extending to the front, we felt that her flank pain was most likely musculoskeletal. She rested comfortably overnight after using skelaxin. In the morning her pain was present but improved and was also helped by  tramadol. Other etiologies were considered such as diverticulitis, pyelonephritis, UTI, viral gastroenteritis, and renal stone. But with a CT that did not show diverticulitis or  renal stone, her being afebrile and without an elevated WBC, and with a UA that did not show signs of infection, these seemed less likely. Nsaids were avoided b/c of an NSAID associated GI bleed she had in the past.   CT images were further reviewed with the radiologist in person who affirms that she does not have a significant stool burden, no inflammation associated with her bypass or diverticula, and no renal stone. We would need a CT with contrast to evaluate for pyelonephritis, but this is unlikely given her clinical picture and was deferred.   Palpitations  She complained of a heart flutter that occurs at night that started about 1 month ago. EKG did not show any signs of arrythmia and the patient denied associated chest pain or other symptoms.  It's possible that this is related to her hyperkalemia and so will resolve with correction. Other possibilities are that it's due to increased vagal tone associated with relaxing at night. She describes that it happens after she takes her evening meds which are isosorbide and clonidine, although they are not normally associated with palpitations.   Hypokalemia  She was severely hypokalemic on admission with a K of 2.3. We replaced it IV times 3 and orally (4o mEq) times 4. Her magnesium was checked and found to be 2.1. Possible potassium losses are in her urine b/c of HCTZ use and possible GI loss with concurrent laxative use and frequent BMs. We held her HCTZ on discharge to f/u with her PCP to  consider adding daily potassium and/or spironolactone for her BP. Her potassium was 4.1 prior to discharge.   HTN  Her blood pressure was elevated during admission with a range 133-149/85-91. All of her home meds were continued except for HCTZ as above.    GERD  She was managed with her  home dose of PPI.   Dispo: Home  Discharge Medications:    Medication List     As of 09/25/2012  9:42 AM    ASK your doctor about these medications         acetaminophen 650 MG CR tablet   Commonly known as: TYLENOL   Take 1 tablet (650 mg total) by mouth 2 (two) times daily as needed. As needed for pain. May take 3 times a day if needed      albuterol 108 (90 BASE) MCG/ACT inhaler   Commonly known as: PROVENTIL HFA;VENTOLIN HFA   Inhale 2 puffs into the lungs every 4 (four) hours as needed for wheezing or shortness of breath.      b complex vitamins tablet   Take 1 tablet by mouth daily.      benzonatate 100 MG capsule   Commonly known as: TESSALON   Take 1 capsule (100 mg total) by mouth 3 (three) times daily as needed for cough.      calcium carbonate 1250 MG chewable tablet   Commonly known as: OS-CAL   Chew 1 tablet by mouth daily.      cloNIDine 0.2 MG tablet   Commonly known as: CATAPRES   Take 1 tablet (0.2 mg total) by mouth 2 (two) times daily.      docusate sodium 100 MG capsule   Commonly known as: COLACE   Take 100 mg by mouth 2 (two) times daily.      ferrous sulfate 325 (65 FE) MG tablet   Take 325 mg by mouth 2 (two) times daily.      fexofenadine 180 MG tablet   Commonly known as: ALLEGRA   Take 180 mg by mouth daily.      fluticasone 50 MCG/ACT nasal spray   Commonly known as: FLONASE   Place 2 sprays into the nose daily. 2 sprays in each nostril in am after nasal rinse      hydrochlorothiazide 12.5 MG capsule   Commonly known as: MICROZIDE   Take 1 capsule (12.5 mg total) by mouth daily.      isosorbide dinitrate 20 MG tablet   Commonly known as: ISORDIL   Take 20 mg by mouth 2 (two) times daily.      lisinopril 20 MG tablet   Commonly known as: PRINIVIL,ZESTRIL   Take 1 tablet (20 mg total) by mouth daily.      nitroGLYCERIN 0.4 MG SL tablet   Commonly known as: NITROSTAT   Place 0.4 mg under the tongue every 5 (five) minutes as  needed. If 3 doses does not relieve pain, call 911.      omeprazole 40 MG capsule   Commonly known as: PRILOSEC   Take 1 capsule (40 mg total) by mouth daily. Please use a generic different than previous.      polyethylene glycol powder powder   Commonly known as: GLYCOLAX/MIRALAX   Take 17 g by mouth daily.      ursodiol 300 MG capsule   Commonly known as: ACTIGALL      zolpidem 10 MG tablet   Commonly known as: AMBIEN   Take 1/2 to 1 tab at  bedtime as needed for insomnia       Issues for Follow Up:  - f/u BMP for severe hypokalemia - Consider adding spironolactone for hypokalemia and HTN if needed, we stopped her HCTZ - Consider daily KCl - Improvement of flank pain  Outstanding Results: None  Discharge Instructions: Please refer to Patient Instructions section of EMR for full details.  Patient was counseled important signs and symptoms that should prompt return to medical care, changes in medications, dietary instructions, activity restrictions, and follow up appointments.       Follow-up Information    Follow up with Dessa Phi, MD. On 10/03/2012. (Scheduled at 3:30 )    Contact information:   [address] [place] [address] [phone]          Discharge Condition: Carlena Sax, MD 09/25/2012, 9:42 AM

## 2012-09-25 NOTE — H&P (Signed)
Family Medicine Teaching Service Attending Note  I interviewed and examined patient [name] and reviewed their tests and x-rays.  I discussed with Dr. Konrad Dolores and reviewed their note for today.  I agree with their assessment and plan.     Additionally  This AM feels much improved Still with left flank pain with movement.  No fever or nausea or vomiting or shortness of breath  Pain worse with direct palpation or range of motion.  No deformity or point tenderness Heart - Regular rate and rhythm.  No murmurs, gallops or rubs.    Lungs:  Normal respiratory effort, chest expands symmetrically. Lungs are clear to auscultation, no crackles or wheezes. No tachycardia  Flank Pain - musculoskeletal most likely.  Could per shingles -watch for rash.   No signs of cardiopulmonary disease.  Ok to discharge to follow up with PCP for resolution

## 2012-09-25 NOTE — Progress Notes (Signed)
Magnesium level= 2.1.  Dr. Deirdre Priest at bedside he stated to not give IV Magnesium

## 2012-09-25 NOTE — Progress Notes (Signed)
Family Medicine Teaching Service Daily Progress Note Service Page: [phone]  Patient Assessment: 56 y.o. year old female with PMH of HTN, diverticulosis, CVA , GERD, S/p gastric bypass here with Abdominal pain, hypokalemia, and palpitations.  Subjective:  Pain present but not as bad this am. Slept well, and has good PO intake of fluids over night. States that since her surgery her appetite has been pretty low and that she takes miralax and senakot daily. Averages 2 bm's per day described as soft. Wants to go home today if she can.   States she had a stroke unknowingly that was incidentally found on an MRI about 5 years ago, no residual effects., no gait problems.   Objective: Temp:  [97.9 F (36.6 C)-98.7 F (37.1 C)] 97.9 F (36.6 C) (11/25 0500) Pulse Rate:  [70-88] 76  (11/25 0500) Resp:  [16-19] 18  (11/25 0500) BP: (133-149)/(85-96) 149/91 mmHg (11/25 0500) SpO2:  [95 %-99 %] 95 % (11/25 0500) Weight:  [234 lb (106.142 kg)] 234 lb (106.142 kg) (11/24 2243)  Exam: Gen: NAD, alert, cooperative with exam  HEENT: NCAT, EOMI  CV: RRR, good S1/S2, no murmur  Resp: CTABL, no wheezes, non-labored  Abd: Soft, mildly tender to deep palpation with no guarding or rebound, well healed surgical scars  MSK: tenderness not reproducible this am.  Ext: No edema noted, warm  Neuro: Alert and oriented, No gross deficits, strength 5/5 in grip and LE. Senstaion intact in all four extremeties.  Skin: no rash on back or abdomen    I have reviewed the patient's medications, labs, imaging, and diagnostic testing.  Notable results are summarized below.  CBC BMET   Lab 09/25/12 0204 09/24/12 1809 09/24/12 1714  WBC 6.3 -- 6.9  HGB 10.5* 12.6 12.2  HCT 31.5* 37.0 36.7  PLT 258 -- 270    Lab 09/25/12 0204 09/24/12 1809 09/24/12 1714  NA 142 143 141  K 3.1* 2.3* 2.2*  CL 105 99 98  CO2 30 -- 34*  BUN 8 7 8   CREATININE 0.65 0.90 0.75  GLUCOSE 100* 101* 101*  CALCIUM 8.6 -- 9.5      Imaging/Diagnostic Tests: CT Abd/pelvis Without 09/24/2012  IMPRESSION:  1. No definite acute findings in the abdomen or pelvis to account  for the patient's symptoms. Specifically, no abnormal urinary  tract calculi or findings of urinary tract obstruction.  2. However, there is very subtle indistinctness of the fat in the  left renal hilum, which could be seen in the setting of  pyelonephritis. Clinical correlation is recommended.  3. Extensive colonic diverticulosis without findings to suggest  acute diverticulitis at this time.  4. Slight enlargement of a 3 cm low attenuation lesion in segment  2 of the liver, which likely represent a large cyst.  5. Indeterminate low attenuation lesion extending slightly  exophytically from the upper pole of the left kidney is unchanged  compared to prior study, likely represent a cyst.  6. Status post gastric bypass.   Plan: NYASHA RAHILLY is a 56 y.o. year old female with PMH of HTN, diverticulosis, CVA , GERD, S/p gastric bypass here with Abdominal pain, hypokalemia, and palpitations.   Abdominal/Flank pain - improving - most likely musculoskeletal pain - CT does not show any acute findings - Her UA does not show signs of infection  - Tylenol, ultram, and skelaxin for pain control as she has a Hx of bleed assoc with NSADs.  - monitor   Heart flutter  - Onset  1 month ago, no EKG evidence of arrythmia, Heart with RRR on exam.  - possibly associated with isosorbide dinitrate or clonidine (per patient Hx) and also possibly assoc with hypokalemia.  - Continue isosorbide and clonidine for now - Since July her potassium has been running normal to low around 3.5, severely low at 2.2 on admission - Possibly due to hypokalemia that started after her gastric bypass.  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  Code Status: Full for now, will clarify    Kevin Fenton, MD 09/25/2012, 6:22 AM

## 2012-09-25 NOTE — Progress Notes (Signed)
Discharge instructions reviewed with and given to patient.  Patient stated she doesn't have any nitro at home.  I notified MD who is to escript it to the patients pharmacy

## 2012-09-25 NOTE — Progress Notes (Signed)
FMTS Attending Daily Note: Hannah Aleshire MD [phone] pager office [phone] I  have seen and examined this patient, reviewed their chart. I have discussed this patient with the resident. I agree with the resident's findings, assessment and care plan. 

## 2012-09-26 ENCOUNTER — Telehealth: Payer: Self-pay | Admitting: Family Medicine

## 2012-09-26 MED ORDER — METHOCARBAMOL 500 MG PO TABS: 500.0000 mg | ORAL_TABLET | Freq: Three times a day (TID) | ORAL | Status: DC

## 2012-09-26 NOTE — Telephone Encounter (Signed)
Received call from patient's pharmacy [facility] that skalaxin is not covered by insurance so requested to change it to robaxin 500mg  TID.  Changed to robaxin 500mg  TID.

## 2012-09-26 NOTE — Discharge Summary (Signed)
Family Medicine Teaching Service  Discharge Note : Attending Auden Tatar MD Pager [phone] Office [phone] I have seen and examined this patient, reviewed their chart and discussed discharge planning wit the resident at the time of discharge. I agree with the discharge plan as above.  

## 2012-09-27 ENCOUNTER — Telehealth: Payer: Self-pay | Admitting: Family Medicine

## 2012-09-27 NOTE — Telephone Encounter (Signed)
Patient is calling after being D/C'd from the hospital yesterday.  One of her BP pills was changed and she wanted to speak to Dr. Armen Pickup about that.  It looks like it was Methocarbamol.

## 2012-10-02 NOTE — Telephone Encounter (Signed)
Called patient back. She has a question about stopping her HCTZ. She has not stopped it bc she had nothing to take instead.   Advised her to stop it due to low K+. Will check K+ and BP at f/u and discuss starting spirolactone.

## 2012-10-03 ENCOUNTER — Encounter: Payer: Self-pay | Admitting: Family Medicine

## 2012-10-03 ENCOUNTER — Ambulatory Visit (INDEPENDENT_AMBULATORY_CARE_PROVIDER_SITE_OTHER): Payer: Medicare PPO | Admitting: Family Medicine

## 2012-10-03 VITALS — BP 148/100 | HR 86 | Temp 98.2°F | Ht 64.0 in | Wt 225.0 lb

## 2012-10-03 DIAGNOSIS — M549 Dorsalgia, unspecified: Secondary | ICD-10-CM

## 2012-10-03 DIAGNOSIS — I10 Essential (primary) hypertension: Secondary | ICD-10-CM

## 2012-10-03 LAB — COMPREHENSIVE METABOLIC PANEL
ALT: 14 U/L (ref 0–35)
AST: 19 U/L (ref 0–37)
Albumin: 3.8 g/dL (ref 3.5–5.2)
Alkaline Phosphatase: 82 U/L (ref 39–117)
BUN: 11 mg/dL (ref 6–23)
CO2: 29 mEq/L (ref 19–32)
Calcium: 9.5 mg/dL (ref 8.4–10.5)
Chloride: 109 mEq/L (ref 96–112)
Creat: 0.6 mg/dL (ref 0.50–1.10)
Glucose, Bld: 81 mg/dL (ref 70–99)
Potassium: 3.7 mEq/L (ref 3.5–5.3)
Sodium: 146 mEq/L — ABNORMAL HIGH (ref 135–145)
Total Bilirubin: 0.4 mg/dL (ref 0.3–1.2)
Total Protein: 6.8 g/dL (ref 6.0–8.3)

## 2012-10-03 MED ORDER — ESOMEPRAZOLE MAGNESIUM 40 MG PO CPDR: 40.0000 mg | DELAYED_RELEASE_CAPSULE | Freq: Every day | ORAL | Status: DC

## 2012-10-03 MED ORDER — URSODIOL 300 MG PO CAPS: 300.0000 mg | ORAL_CAPSULE | Freq: Two times a day (BID) | ORAL | Status: DC

## 2012-10-03 MED ORDER — LISINOPRIL 40 MG PO TABS: 40.0000 mg | ORAL_TABLET | Freq: Every day | ORAL | Status: DC

## 2012-10-03 MED ORDER — ISOSORBIDE DINITRATE 20 MG PO TABS: 20.0000 mg | ORAL_TABLET | Freq: Two times a day (BID) | ORAL | Status: DC

## 2012-10-03 MED ORDER — DOCUSATE SODIUM 100 MG PO CAPS: 100.0000 mg | ORAL_CAPSULE | Freq: Two times a day (BID) | ORAL | Status: DC | PRN

## 2012-10-03 MED ORDER — CLONIDINE HCL 0.2 MG PO TABS: 0.2000 mg | ORAL_TABLET | Freq: Two times a day (BID) | ORAL | Status: DC

## 2012-10-03 NOTE — Patient Instructions (Addendum)
Itali,  Thank you for coming in today.  I have refilled you medications with the following changes:  1. Increased lisinopril to 40 mg daily  2. Changed to nexium 40 daily   Please get blood work done.  I will call with results and plan regarding starting the spironolactone, potassium sparing diuretic.   Dr. Armen Pickup

## 2012-10-03 NOTE — Progress Notes (Signed)
Subjective:     Patient ID: [name], female   DOB: 1955/12/20, 56 y.o.   MRN: [id number]  HPI 56 yo F presents for hospital follow up. She was admitted for L flank pain and hypokalemia.   1. L flank pain: MSK in nature. Some pain last PM. Improved with muscle relaxer. No dysuria, fever, N,V. Still working out daily.   2. Hypokalemia: patient d/cd HCTZ yesterday. Denies muscle aches or cramps except for L flank. Having two soft BMs daily.   3. HTN: compliant with meds. Denies CP and SOB. Has some LLE R>L.   Review of Systems As per HPI     Objective:   Physical Exam BP 148/100  Pulse 86  Temp 98.2 F (36.8 C) (Oral)  Ht 5\' 4"  (1.626 m)  Wt 225 lb (102.059 kg)  BMI 38.62 kg/m2 General appearance: alert, cooperative and no distress Back: symmetric, no curvature. ROM normal. No CVA tenderness. L trigger point overlying latissimus dorsi.  Lungs: normal work of breathing.  Extremities: edema 1 + RLE, trace LLE      Assessment and Plan:

## 2012-10-04 DIAGNOSIS — M549 Dorsalgia, unspecified: Secondary | ICD-10-CM | POA: Insufficient documentation

## 2012-10-04 HISTORY — DX: Dorsalgia, unspecified: M54.9

## 2012-10-04 NOTE — Assessment & Plan Note (Addendum)
A: elevated but at goal when rechecked. Trace edema on exam. Discussed with patient that starting a K= sparing diuretics is an option.  P: Checked K+- wnl/low normal  Plan to start aldactone 12.5 mg with close monitoring of potassium, in one week. Will inform patient before ordering.

## 2012-10-04 NOTE — Assessment & Plan Note (Signed)
A: L MSK back pain with spasm P:  Continue robaxin, tramadol, exercise with focus on core strength

## 2012-10-11 ENCOUNTER — Encounter: Payer: Self-pay | Admitting: Family Medicine

## 2012-10-11 ENCOUNTER — Ambulatory Visit (INDEPENDENT_AMBULATORY_CARE_PROVIDER_SITE_OTHER): Payer: Medicare PPO | Admitting: Family Medicine

## 2012-10-11 VITALS — BP 142/90 | HR 79 | Temp 98.1°F | Ht 64.0 in | Wt 226.0 lb

## 2012-10-11 DIAGNOSIS — E1169 Type 2 diabetes mellitus with other specified complication: Secondary | ICD-10-CM

## 2012-10-11 DIAGNOSIS — I10 Essential (primary) hypertension: Secondary | ICD-10-CM

## 2012-10-11 LAB — POCT GLYCOSYLATED HEMOGLOBIN (HGB A1C): Hemoglobin A1C: 5.7

## 2012-10-11 NOTE — Assessment & Plan Note (Signed)
A: elevated in office but near goal. At goal at home per report. P: No new medications until ambulatory BP monitoring done.

## 2012-10-11 NOTE — Patient Instructions (Addendum)
Alayjah,   Thank you for coming in to see me today. Please keep ambulatory BP monitoring appointment on 12/18.  No new BP medication until I have better idea of how your blood pressures run at home, especially at night.  F/u in 3-4 weeks.   Dr. Armen Pickup

## 2012-10-11 NOTE — Assessment & Plan Note (Signed)
A: previously diabetic. Now prediabetes following weight loss surgery. P: Diet control and continued weight loss.

## 2012-10-11 NOTE — Progress Notes (Signed)
Subjective:     Patient ID: [name], female   DOB: 13-Dec-1955, 56 y.o.   MRN: [id number]  HPI 56 yo F presents for f/u eval to discuss HTN.   Compliant with medications. No vision changes, HA, CP, SOB. Reports normal BPs at home 108-117/80s. Has ambulatory BP monitoring scheduled for 10/19/12. Reports decreased LE edema.   Review of Systems As per HPI    Objective:   Physical Exam BP 142/90  Pulse 79  Temp 98.1 F (36.7 C) (Oral)  Ht 5\' 4"  (1.626 m)  Wt 226 lb (102.513 kg)  BMI 38.79 kg/m2 General appearance: alert, cooperative and no distress Eyes: conjunctivae/corneas clear. PERRL, EOM's intact.  Throat: lips, mucosa, and tongue normal; teeth and gums normal Lungs: clear to auscultation bilaterally Heart: regular rate and rhythm, S1, S2 normal, no murmur, click, rub or gallop Extremities: edema trace   Lab Results  Component Value Date   HGBA1C 6.1 06/07/2012  A1c 5.7 today.      Assessment and Plan:

## 2012-10-19 ENCOUNTER — Encounter: Payer: Self-pay | Admitting: Pharmacist

## 2012-10-19 ENCOUNTER — Ambulatory Visit (INDEPENDENT_AMBULATORY_CARE_PROVIDER_SITE_OTHER): Payer: Medicare PPO | Admitting: Pharmacist

## 2012-10-19 VITALS — BP 166/99 | HR 54 | Ht 64.0 in | Wt 221.9 lb

## 2012-10-19 DIAGNOSIS — I10 Essential (primary) hypertension: Secondary | ICD-10-CM

## 2012-10-19 NOTE — Progress Notes (Signed)
  Subjective:    Patient ID: [name], female    DOB: Mar 24, 1956, 56 y.o.   MRN: [id number]  HPI   Reylynn JAENA BROCATO presented to the clinic for 24-hour ambulatory blood pressure monitoring after referral by Dr. Armen Pickup.  Medication compliance is reported to be good.  Of NOTE she stopped her HCTZ during and after last hospital visit.   Discussed procedure for wearing the monitor and gave patient written instructions. Monitor was placed on non-dominant arm with instructions to return in the morning. Patient returned to the clinic and reported that overall she tolerated the device well BUT noted the sleeve slid down her arm multiple times.    Current BP Medications Clonidine 02.mg BID, lisinopril 20mg   Other antihypertensives tried in the past: hctz Allergies  Allergen Reactions  . Aspirin Other (See Comments)    REACTION: GI BLEED !  AVOID NSAID'S As well  Was taking ibuprofen at the same time.   . Nsaids Other (See Comments)    REACTION: GI bleed Second time with           Review of Systems     Objective:   Physical Exam  Last 3 Office BP readings: 142/90 mmHg 148/100 mmHg 135/80 mmHg  Today's Office BP reading: 166/99 mmHg (automated reading)  ABPM Study Data: Arm Placement left arm   Overall Mean 24hr BP:   169/110 mmHg HR: 86   Daytime Mean BP:  168/114 mmHg HR: 90   Nighttime Mean BP:  173/100 mmHg HR: 77   Dipping Pattern: no  Sys:   3.5%   Dia: 12.8%   [normal dipping ~10-20%]   Non-hypertensive ABPM thresholds: daytime BP <135/85 mmHg, sleeptime BP <120/75 mmHg NICE Hypertension Guidelines ([place]) using ABPM: Stage I: >135/85 mmHg, Stage 2: >150/95 mmHg)   BMET    Component Value Date/Time   NA 146* 10/03/2012 1651   K 3.7 10/03/2012 1651   CL 109 10/03/2012 1651   CO2 29 10/03/2012 1651   GLUCOSE 81 10/03/2012 1651   BUN 11 10/03/2012 1651   CREATININE 0.60 10/03/2012 1651   CREATININE 0.62 09/25/2012 1224   CALCIUM 9.5 10/03/2012 1651   GFRNONAA  >90 09/25/2012 1224   GFRAA >90 09/25/2012 1224          Assessment & Plan:   Lanelle Bal Ruppert completed a 24-hour ambulatory blood pressure study with results that appear to be consistent with suboptimal control of hypertension despite two drug therapy. This occurs in a patient who has undergone gastric bypass surgery in the last 6 months. She has a weight target of 168 lbs and is currently working very hard at her diet and exercise plan.   Her average blood pressure of 169/129mmHg, with a nocturnal dipping pattern that is abnormal  with systolic reading.  This may necessitate nocturnal dosing of additional agents or moving ACE - I to PM dosing.   Changes to medications; add spironolactone 25mg  daily. Follow up labs and BP in early January. Written patient instructions provided. Follow-up with Dr. Armen Pickup if possible.  Consider amlodipine 2.5mg  with PM dosing if BP remains higher than target of < 140/90 .  Patient seen with Juanita Craver, PharmD Candidate.

## 2012-10-20 MED ORDER — SPIRONOLACTONE 25 MG PO TABS: 25.0000 mg | ORAL_TABLET | Freq: Every day | ORAL | Status: DC

## 2012-10-20 NOTE — Patient Instructions (Addendum)
Continue all medications as previously prescribed.   Start new medicine once daily - spironolactone 25mg .   Return - first week of January for follow up labs and visit with Dr. Armen Pickup.

## 2012-10-20 NOTE — Assessment & Plan Note (Signed)
[name] completed a 24-hour ambulatory blood pressure study with results that appear to be consistent with suboptimal control of hypertension despite two drug therapy. This occurs in a patient who has undergone gastric bypass surgery in the last 6 months. She has a weight target of 168 lbs and is currently working very hard at her diet and exercise plan.   Her average blood pressure of 169/161mmHg, with a nocturnal dipping pattern that is abnormal  with systolic reading.  This may necessitate nocturnal dosing of additional agents or moving ACE - I to PM dosing.   Changes to medications; add spironolactone 25mg  daily. Follow up labs and BP in early January. Written patient instructions provided. Follow-up with Dr. Armen Pickup if possible.  Consider amlodipine 2.5mg  with PM dosing if BP remains higher than target of < 140/90 .  Patient seen with [name], PharmD Candidate.

## 2012-10-21 ENCOUNTER — Emergency Department (HOSPITAL_COMMUNITY)
Admission: EM | Admit: 2012-10-21 | Discharge: 2012-10-21 | Disposition: A | Discharge status: Home / Self Care | Payer: Medicare PPO | Attending: Emergency Medicine | Admitting: Emergency Medicine

## 2012-10-21 DIAGNOSIS — W260XXA Contact with knife, initial encounter: Secondary | ICD-10-CM | POA: Insufficient documentation

## 2012-10-21 DIAGNOSIS — Z8673 Personal history of transient ischemic attack (TIA), and cerebral infarction without residual deficits: Secondary | ICD-10-CM | POA: Insufficient documentation

## 2012-10-21 DIAGNOSIS — D649 Anemia, unspecified: Secondary | ICD-10-CM | POA: Insufficient documentation

## 2012-10-21 DIAGNOSIS — Z79899 Other long term (current) drug therapy: Secondary | ICD-10-CM | POA: Insufficient documentation

## 2012-10-21 DIAGNOSIS — Z8619 Personal history of other infectious and parasitic diseases: Secondary | ICD-10-CM | POA: Insufficient documentation

## 2012-10-21 DIAGNOSIS — S61209A Unspecified open wound of unspecified finger without damage to nail, initial encounter: Secondary | ICD-10-CM | POA: Insufficient documentation

## 2012-10-21 DIAGNOSIS — I10 Essential (primary) hypertension: Secondary | ICD-10-CM | POA: Insufficient documentation

## 2012-10-21 DIAGNOSIS — M79609 Pain in unspecified limb: Secondary | ICD-10-CM | POA: Insufficient documentation

## 2012-10-21 DIAGNOSIS — Y9389 Activity, other specified: Secondary | ICD-10-CM | POA: Insufficient documentation

## 2012-10-21 DIAGNOSIS — K5731 Diverticulosis of large intestine without perforation or abscess with bleeding: Secondary | ICD-10-CM | POA: Insufficient documentation

## 2012-10-21 DIAGNOSIS — S61219A Laceration without foreign body of unspecified finger without damage to nail, initial encounter: Secondary | ICD-10-CM

## 2012-10-21 DIAGNOSIS — Z8739 Personal history of other diseases of the musculoskeletal system and connective tissue: Secondary | ICD-10-CM | POA: Insufficient documentation

## 2012-10-21 DIAGNOSIS — M722 Plantar fascial fibromatosis: Secondary | ICD-10-CM | POA: Insufficient documentation

## 2012-10-21 DIAGNOSIS — Z8679 Personal history of other diseases of the circulatory system: Secondary | ICD-10-CM | POA: Insufficient documentation

## 2012-10-21 DIAGNOSIS — Y929 Unspecified place or not applicable: Secondary | ICD-10-CM | POA: Insufficient documentation

## 2012-10-21 DIAGNOSIS — W261XXA Contact with sword or dagger, initial encounter: Secondary | ICD-10-CM | POA: Insufficient documentation

## 2012-10-21 MED ORDER — TETANUS-DIPHTH-ACELL PERTUSSIS 5-2.5-18.5 LF-MCG/0.5 IM SUSP: 0.5000 mL | Freq: Once | INTRAMUSCULAR | Status: DC

## 2012-10-21 NOTE — ED Notes (Signed)
Pt was opening a box with a kitchen knife and cut her middle finger across the second joint.

## 2012-10-21 NOTE — ED Provider Notes (Signed)
Medical screening examination/treatment/procedure(s) were performed by non-physician practitioner and as supervising physician I was immediately available for consultation/collaboration.   Lyanne Co, MD 10/21/12 [phone]

## 2012-10-21 NOTE — ED Provider Notes (Signed)
History     CSN: [id number]  Arrival date & time 10/21/12  1016   First MD Initiated Contact with Patient 10/21/12 1026      Chief Complaint  Patient presents with  . Extremity Laceration    (Consider location/radiation/quality/duration/timing/severity/associated sxs/prior treatment) HPI [name] is a 56 y.o. female who presents with complaint of laceration of the finger. Pt states she was opening a box with a knife and it hit her left middle finger, cutting it over palmar surface over middle phalanx. Pt states no other injuries. Pt states she has good sensation to the distal finger, no weakness with flexion or exesion of the finger. Tetanus within 5 years.    Past Medical History  Diagnosis Date  . Anemia   . Hypertension   . Diverticulosis of colon with hemorrhage 2001    4  previous hospital admissions for bleeding  . Stroke   . Tubular adenoma of colon 2009    s/p biopsy no high grade dysplasia or malignancy identified.   . Cervical cancer screening 12/01/2011  . VENOUS INSUFFICIENCY, CHRONIC 12/29/2006    Qualifier: Diagnosis of  By: Bebe Shaggy    . Plantar fasciitis 01/28/2011  . Muscle cramps at night 02/23/2011  . H. pylori infection 02/09/2012    Diagnosed at [facility]. Treated with prevpac   . FOOT PAIN, BILATERAL 04/07/2010    Qualifier: Diagnosis of  By: Sandi Mealy  MD, [name] Cornfield    . 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 CHEST PAIN, ATYPICAL 10/13/2009    Qualifier: Diagnosis of  By: Sandi Mealy  MD, [name] Cornfield      Past Surgical History  Procedure Date  . Abdominal hysterectomy   . Laparoscopic hysterectomy 1977    left ovaries, took uterus, no cervix on exam (1/13)  . Knee replacements 2003  . 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 Cancer Father 83    Died from complications of colon CA  . Diabetes Mother   . Hypertension Mother   . Diabetes Sister   . Heart disease  Sister   . Diabetes Sister   . Stroke Sister   . Diabetes Brother   . Stroke Brother   . Diabetes Brother   . Diabetes Brother   . Diabetes Brother     History  Substance Use Topics  . Smoking status: Never Smoker   . Smokeless tobacco: Never Used  . Alcohol Use: No    OB History    Grav Para Term Preterm Abortions TAB SAB Ect Mult Living                  Review of Systems  Constitutional: Negative for fever and chills.  HENT: Negative for neck pain and neck stiffness.   Skin: Positive for wound.  Neurological: Negative for weakness and numbness.    Allergies  Aspirin and Nsaids  Home Medications   Current Outpatient Rx  Name  Route  Sig  Dispense  Refill  . ACETAMINOPHEN ER 650 MG PO TBCR   Oral   Take 1 tablet (650 mg total) by mouth 2 (two) times daily as needed. As needed for pain. May take 3 times a day if needed   90 tablet   2   . ALBUTEROL SULFATE HFA 108 (90 BASE) MCG/ACT IN AERS   Inhalation   Inhale 2 puffs into the lungs every 4 (four)  hours as needed for wheezing or shortness of breath.   1 Inhaler   1   . B COMPLEX PO TABS   Oral   Take 1 tablet by mouth daily.   90 tablet   2   . BENZONATATE 100 MG PO CAPS   Oral   Take 1 capsule (100 mg total) by mouth 3 (three) times daily as needed for cough.   20 capsule   0   . CALCIUM CARBONATE 1250 MG PO CHEW   Oral   Chew 1 tablet by mouth daily.         Marland Kitchen CLONIDINE HCL 0.2 MG PO TABS   Oral   Take 1 tablet (0.2 mg total) by mouth 2 (two) times daily.   180 tablet   3   . DOCUSATE SODIUM 100 MG PO CAPS   Oral   Take 1 capsule (100 mg total) by mouth 2 (two) times daily as needed for constipation.   60 capsule   11   . ESOMEPRAZOLE MAGNESIUM 40 MG PO CPDR   Oral   Take 1 capsule (40 mg total) by mouth daily before breakfast.   90 capsule   3   . FERROUS SULFATE 325 (65 FE) MG PO TABS   Oral   Take 325 mg by mouth 2 (two) times daily.         Marland Kitchen FEXOFENADINE HCL 180 MG PO  TABS   Oral   Take 180 mg by mouth daily.           Marland Kitchen FLUTICASONE PROPIONATE 50 MCG/ACT NA SUSP   Nasal   Place 2 sprays into the nose daily. 2 sprays in each nostril in am after nasal rinse   16 g   11   . ISOSORBIDE DINITRATE 20 MG PO TABS   Oral   Take 1 tablet (20 mg total) by mouth 2 (two) times daily.   180 tablet   3   . LISINOPRIL 40 MG PO TABS   Oral   Take 1 tablet (40 mg total) by mouth daily.   90 tablet   3   . METHOCARBAMOL 500 MG PO TABS   Oral   Take 1 tablet (500 mg total) by mouth 3 (three) times daily.   90 tablet   0   . NITROGLYCERIN 0.4 MG SL SUBL   Sublingual   Place 0.4 mg under the tongue every 5 (five) minutes as needed. If 3 doses does not relieve pain, call 911.         Marland Kitchen POLYETHYLENE GLYCOL 3350 PO POWD   Oral   Take 17 g by mouth daily.   255 g   11   . SPIRONOLACTONE 25 MG PO TABS   Oral   Take 1 tablet (25 mg total) by mouth daily.   30 tablet   3   . TRAMADOL HCL 50 MG PO TABS   Oral   Take 1 tablet (50 mg total) by mouth every 6 (six) hours as needed.   120 tablet   0   . URSODIOL 300 MG PO CAPS   Oral   Take 1 capsule (300 mg total) by mouth 2 (two) times daily.   180 capsule   3   . ZOLPIDEM TARTRATE 10 MG PO TABS      Take 1/2 to 1 tab at bedtime as needed for insomnia   90 tablet   0     BP 139/92  Pulse 82  Temp 98.9 F (37.2 C) (Oral)  Resp 16  SpO2 98%  Physical Exam  Nursing note and vitals reviewed. Constitutional: She appears well-developed and well-nourished. No distress.  Cardiovascular: Normal rate, regular rhythm and normal heart sounds.   Pulmonary/Chest: Effort normal and breath sounds normal. No respiratory distress. She has no wheezes. She has no rales.  Musculoskeletal:       2 cm superficial laceration over palmar surface of the left middle finger over the middle phalanx. Full rom of the finger. Normal sensation to the distal finger, normal 2 pt discrimination. Good strength with  flexion and extension at both DIP and PIP joints. <2sec cap refill distally  Neurological: She is alert.  Skin: Skin is warm and dry.    ED Course  Procedures (including critical care time)  LACERATION REPAIR Performed by: Lottie Mussel Authorized by: Jaynie Crumble A Consent: Verbal consent obtained. Risks and benefits: risks, benefits and alternatives were discussed Consent given by: patient Patient identity confirmed: provided demographic data Prepped and Draped in normal sterile fashion Wound explored  Laceration Location: left middle finger  Laceration Length: 2cm  No Foreign Bodies seen or palpated  Anesthesia: local infiltration  Local anesthetic: lidocaine 2% wo epinephrine  Anesthetic total: 2 ml  Irrigation method: syringe Amount of cleaning: standard  Skin closure: prolene 5.0  Number of sutures: 3  Technique: simple interrupted  Patient tolerance: Patient tolerated the procedure well with no immediate complications.  1. Laceration of finger of left hand       MDM  Pt with laceration to left middle finger, superficial. No tendon or nerve involvemet, good strength and sensation distally and proximally. Good cap refill. Laceration repaired. Bacitracin topically at home, wound care. Follow up in 7 days for suture removal.        Lottie Mussel, PA 10/21/12 1556

## 2012-10-23 NOTE — Progress Notes (Signed)
Patient ID: [name], female   DOB: Apr 28, 1956, 56 y.o.   MRN: [id number] Reviewed: Agree with Dr. Macky Lower management and documentation.

## 2012-10-30 ENCOUNTER — Ambulatory Visit (INDEPENDENT_AMBULATORY_CARE_PROVIDER_SITE_OTHER): Payer: Medicare PPO | Admitting: Family Medicine

## 2012-10-30 ENCOUNTER — Encounter: Payer: Self-pay | Admitting: Family Medicine

## 2012-10-30 VITALS — BP 158/105 | HR 85 | Temp 97.8°F | Ht 64.0 in | Wt 219.5 lb

## 2012-10-30 DIAGNOSIS — Z4802 Encounter for removal of sutures: Secondary | ICD-10-CM | POA: Insufficient documentation

## 2012-10-30 DIAGNOSIS — L988 Other specified disorders of the skin and subcutaneous tissue: Secondary | ICD-10-CM

## 2012-10-30 DIAGNOSIS — R238 Other skin changes: Secondary | ICD-10-CM

## 2012-10-30 NOTE — Assessment & Plan Note (Signed)
Sutures removed. See note.

## 2012-10-30 NOTE — Patient Instructions (Addendum)
[name],  Thank you for coming in today.  I have removed your stitches today. Please apply neosporin for the next 3 days only. Then you can return to normal skin care.   For neck: you had a thin fluid filled bullae. It may come back but should drain on its own. If it comes back apply warm compresses. If it does not drain or you develop bad pain/fever come see me.   Dr. Armen Pickup

## 2012-10-30 NOTE — Assessment & Plan Note (Addendum)
Bullae drained spontaneously.  P: For neck: you had a thin fluid filled bullae. It may come back but should drain on its own. If it comes back apply warm compresses. If it does not drain or you develop bad pain/fever come see me.

## 2012-10-30 NOTE — Progress Notes (Signed)
Subjective:     Patient ID: [name], female   DOB: 1956/03/04, 56 y.o.   MRN: [id number]  HPI 56 yo F presents for same day visit for the following:  1. Suture removal: from L middle finger, palmar surface. Sutures placed following laceration on 10/21/12. She still has ache in finger. Denies swelling and tenderness. Apply neosporin ointment.   2. Bump on posterior neck: x 3 days. Enlarging and painful. Denies fever and chills. No other lesions. No similar lesions previously.   Review of Systems As per HPI    Objective:   Physical Exam BP 158/105  Pulse 85  Temp 97.8 F (36.6 C) (Oral)  Ht 5\' 4"  (1.626 m)  Wt 219 lb 8 oz (99.565 kg)  BMI 37.68 kg/m2 General appearance: alert, cooperative and no distress Skin:  L middle finger: two sutures in place. No redness or swelling along laceration. Skin approximate well. Sutures removed. Site covered with triple antibiotic ointment and band aid.  L posterior neck: 0.75x0.75 cm skin colored bullae, draining clear-Mehlman fluid, overlying skin without redness, streaking or fluctuation. Fluid express from bullae by applying pressure.      Assessment and Plan:

## 2012-11-08 ENCOUNTER — Ambulatory Visit: Payer: Medicare PPO | Admitting: Family Medicine

## 2012-11-15 ENCOUNTER — Ambulatory Visit: Payer: Medicare PPO | Admitting: Family Medicine

## 2012-11-24 ENCOUNTER — Ambulatory Visit (INDEPENDENT_AMBULATORY_CARE_PROVIDER_SITE_OTHER): Payer: Medicare PPO | Admitting: Family Medicine

## 2012-11-24 ENCOUNTER — Other Ambulatory Visit: Payer: Medicare PPO

## 2012-11-24 ENCOUNTER — Encounter: Payer: Self-pay | Admitting: Family Medicine

## 2012-11-24 ENCOUNTER — Ambulatory Visit: Payer: Medicare PPO | Admitting: Family Medicine

## 2012-11-24 ENCOUNTER — Other Ambulatory Visit: Payer: Self-pay | Admitting: Family Medicine

## 2012-11-24 VITALS — BP 120/80 | HR 85 | Temp 98.7°F | Ht 64.0 in | Wt 216.0 lb

## 2012-11-24 DIAGNOSIS — I10 Essential (primary) hypertension: Secondary | ICD-10-CM

## 2012-11-24 DIAGNOSIS — J069 Acute upper respiratory infection, unspecified: Secondary | ICD-10-CM

## 2012-11-24 LAB — BASIC METABOLIC PANEL
BUN: 9 mg/dL (ref 6–23)
CO2: 25 mEq/L (ref 19–32)
Calcium: 9 mg/dL (ref 8.4–10.5)
Chloride: 112 mEq/L (ref 96–112)
Creat: 0.76 mg/dL (ref 0.50–1.10)
Glucose, Bld: 67 mg/dL — ABNORMAL LOW (ref 70–99)
Potassium: 3.8 mEq/L (ref 3.5–5.3)
Sodium: 146 mEq/L — ABNORMAL HIGH (ref 135–145)

## 2012-11-24 NOTE — Patient Instructions (Addendum)
I think you have a flu like illness and have been unable to get over it due to your hardworking ethic. Rest up over the weekend! Take off Monday if you need it. If you are not feeling better by Tuesday or Wednesday please come see [place] again or if you develop fevers.Keep doing what you are doing with the medicine. Mucinex is good as well as the cough medicine for people with high blood pressure. I would take your tylenol 3x a day instaed of twice a day to see if you can help with body aches. Get a good movie or book, wrap yourself up and get to feeling better!  Health Maintenance Due  Topic Date Due  . Colonoscopy  05/05/2010  . Pneumococcal Polysaccharide Vaccine (#2) 09/08/2011    Upper Respiratory Infection, Adult. Flu like illness.  An upper respiratory infection (URI) is also known as the common cold. It is often caused by a type of germ (virus). Colds are easily spread (contagious). You can pass it to others by kissing, coughing, sneezing, or drinking out of the same glass. Usually, you get better in 1 or 2 weeks.  HOME CARE   Only take medicine as told by your doctor.  Use a warm mist humidifier or breathe in steam from a hot shower.  Drink enough water and fluids to keep your pee (urine) clear or pale yellow.  Get plenty of rest.  Return to work when your temperature is back to normal or as told by your doctor. You may use a face mask and wash your hands to stop your cold from spreading. GET HELP RIGHT AWAY IF:   After the first few days, you feel you are getting worse.  You have questions about your medicine.  You have chills, shortness of breath, or brown or red spit (mucus).  You have yellow or brown snot (nasal discharge) or pain in the face, especially when you bend forward.  You have a fever, puffy (swollen) neck, pain when you swallow, or Biehler spots in the back of your throat.  You have a bad headache, ear pain, sinus pain, or chest pain.  You have a high-pitched  whistling sound when you breathe in and out (wheezing).  You have a lasting cough or cough up blood.  You have sore muscles or a stiff neck. MAKE SURE YOU:   Understand these instructions.  Will watch your condition.  Will get help right away if you are not doing well or get worse. Document Released: 04/05/2008 Document Revised: 01/10/2012 Document Reviewed: 02/22/2011 [facility] Patient Information 2013 [place], [place].

## 2012-11-24 NOTE — Progress Notes (Signed)
Subjective:   1. 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 Symptoms started 1 week ago and have essentially been stable including congestion, rhinorrhea, body aches, cough with Keizer sputum, no blood. Denies fever/nausea/vomiting/weight loss. Has had some chills. Has tried mucinex, high blood pressure cough syrup, tessalon pearls. She feels like the nighttime is the worst due to the cough. Also complains of some diarrhea during this time as well as headaches. A lot of fatigue and feels liek 50% normal energy. Takes tylenol regularly twice a day even before illness  ROS--See HPI  Past Medical History-obesity, prediabetes, hypertension, history CVA, hisotory on problem list of cough variant asthma but has normal pre and post bronchodilator PFTs, GERD, history gastric bypass.  Reviewed problem list.  Medications- reviewed and updated Chief complaint-noted  Objective: BP 120/80  Pulse 85  Temp 98.7 F (37.1 C) (Oral)  Ht 5\' 4"  (1.626 m)  Wt 216 lb (97.977 kg)  BMI 37.08 kg/m2 Gen: NAD, resting comfortably on table HEENT: TM nml, nasal turbinates erythematous and rhinorrhea noted, sinuses nontender to palpation, throat nonerythematous.  CV: RRR no murmurs rubs or gallops Lungs: CTAB no crackles, wheeze, rhonchi Skin: warm, dry Neuro: grossly normal, moves all extremities  Assessment/Plan:  Upper Respiratory Infection/Flu-like illness without influenza.   Discussed diagnosis and treatment of URI. Most importantly discussed rest over the weekend as she has been busy working all week and getting more exhausted.  Discussed the importance of avoiding unnecessary antibiotic therapy. Suggested symptomatic OTC remedies per AVS Nasal saline spray for congestion. Nasal steroids to be used more regularly.  Follow up as needed. Call in 4 days if symptoms aren't resolving.

## 2012-11-24 NOTE — Progress Notes (Signed)
BMP DONE TODAY [name] 

## 2012-11-27 ENCOUNTER — Telehealth: Payer: Self-pay | Admitting: Family Medicine

## 2012-11-27 NOTE — Telephone Encounter (Signed)
Called patient. Left VM.  K 3.8 wnl on aldactone BP 120/80. Continue current regimen F/u in 4 weeks for repeat check of K+ and BP f/u.

## 2012-12-11 ENCOUNTER — Encounter (HOSPITAL_COMMUNITY): Payer: Self-pay | Admitting: Emergency Medicine

## 2012-12-11 ENCOUNTER — Emergency Department (HOSPITAL_COMMUNITY)
Admission: EM | Admit: 2012-12-11 | Discharge: 2012-12-11 | Disposition: A | Discharge status: Home / Self Care | Payer: Medicare PPO | Attending: Emergency Medicine | Admitting: Emergency Medicine

## 2012-12-11 DIAGNOSIS — Z8719 Personal history of other diseases of the digestive system: Secondary | ICD-10-CM | POA: Insufficient documentation

## 2012-12-11 DIAGNOSIS — Z8679 Personal history of other diseases of the circulatory system: Secondary | ICD-10-CM | POA: Insufficient documentation

## 2012-12-11 DIAGNOSIS — K644 Residual hemorrhoidal skin tags: Secondary | ICD-10-CM | POA: Insufficient documentation

## 2012-12-11 DIAGNOSIS — Z8739 Personal history of other diseases of the musculoskeletal system and connective tissue: Secondary | ICD-10-CM | POA: Insufficient documentation

## 2012-12-11 DIAGNOSIS — K649 Unspecified hemorrhoids: Secondary | ICD-10-CM

## 2012-12-11 DIAGNOSIS — Z79899 Other long term (current) drug therapy: Secondary | ICD-10-CM | POA: Insufficient documentation

## 2012-12-11 DIAGNOSIS — Z8619 Personal history of other infectious and parasitic diseases: Secondary | ICD-10-CM | POA: Insufficient documentation

## 2012-12-11 DIAGNOSIS — I10 Essential (primary) hypertension: Secondary | ICD-10-CM | POA: Insufficient documentation

## 2012-12-11 DIAGNOSIS — D649 Anemia, unspecified: Secondary | ICD-10-CM | POA: Insufficient documentation

## 2012-12-11 DIAGNOSIS — Z8673 Personal history of transient ischemic attack (TIA), and cerebral infarction without residual deficits: Secondary | ICD-10-CM | POA: Insufficient documentation

## 2012-12-11 MED ORDER — HYDROCORTISONE 2.5 % RE CREA: TOPICAL_CREAM | RECTAL | Status: DC

## 2012-12-11 MED ORDER — LIDOCAINE HCL 2 % EX GEL
Freq: Once | CUTANEOUS | Status: AC
  Administered 2012-12-11: 20 via TOPICAL
  Filled 2012-12-11: qty 20

## 2012-12-11 MED ORDER — LIDOCAINE HCL 2 % EX GEL: CUTANEOUS | Status: DC | PRN

## 2012-12-11 NOTE — ED Provider Notes (Signed)
History     CSN: [id number]  Arrival date & time 12/11/12  0459   First MD Initiated Contact with Patient 12/11/12 0542      Chief Complaint  Patient presents with  . Rectal Pain    (Consider location/radiation/quality/duration/timing/severity/associated sxs/prior treatment) HPI HX per PT - lump in rectal area, painful 5/10 with sitting, is on stool softners, no rectal bleeding, denies any recent straining/ constipation/ heavy lifting, works at a day care center. No ABd pain, onset last night, has not a had a BM since noticing this, no h/o same. Has not tried anything for it. No itching, no fevers.  Past Medical History  Diagnosis Date  . Anemia   . Hypertension   . Diverticulosis of colon with hemorrhage 2001    4  previous hospital admissions for bleeding  . Stroke   . Tubular adenoma of colon 2009    s/p biopsy no high grade dysplasia or malignancy identified.   . Cervical cancer screening 12/01/2011  . VENOUS INSUFFICIENCY, CHRONIC 12/29/2006    Qualifier: Diagnosis of  By: Bebe Shaggy    . Plantar fasciitis 01/28/2011  . Muscle cramps at night 02/23/2011  . H. pylori infection 02/09/2012    Diagnosed at [facility]. Treated with prevpac   . FOOT PAIN, BILATERAL 04/07/2010    Qualifier: Diagnosis of  By: Sandi Mealy  MD, [name] Cornfield    . 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 CHEST PAIN, ATYPICAL 10/13/2009    Qualifier: Diagnosis of  By: Sandi Mealy  MD, [name] Cornfield      Past Surgical History  Procedure Laterality Date  . Abdominal hysterectomy    . Laparoscopic hysterectomy  1977    left ovaries, took uterus, no cervix on exam (1/13)  . Knee replacements  2003  . 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 Cancer Father 44    Died from complications of colon CA  . Diabetes Mother   . Hypertension Mother   . Diabetes Sister   . Heart disease Sister   . Diabetes Sister   . Stroke Sister   .  Diabetes Brother   . Stroke Brother   . Diabetes Brother   . Diabetes Brother   . Diabetes Brother     History  Substance Use Topics  . Smoking status: Never Smoker   . Smokeless tobacco: Never Used  . Alcohol Use: No    OB History   Grav Para Term Preterm Abortions TAB SAB Ect Mult Living                  Review of Systems  Constitutional: Negative for fever and chills.  HENT: Negative for neck pain and neck stiffness.   Eyes: Negative for pain.  Respiratory: Negative for shortness of breath.   Cardiovascular: Negative for chest pain.  Gastrointestinal: Negative for abdominal pain, blood in stool and anal bleeding.  Genitourinary: Negative for dysuria.  Musculoskeletal: Negative for back pain.  Skin: Negative for rash.  Neurological: Negative for headaches.  All other systems reviewed and are negative.    Allergies  Aspirin and Nsaids  Home Medications   Current Outpatient Rx  Name  Route  Sig  Dispense  Refill  . acetaminophen (TYLENOL) 650 MG CR tablet   Oral   Take 1 tablet (650 mg total) by mouth 2 (two) times daily as needed. As needed for pain. May  take 3 times a day if needed   90 tablet   2   . b complex vitamins tablet   Oral   Take 1 tablet by mouth daily.   90 tablet   2   . calcium carbonate (OS-CAL) 1250 MG chewable tablet   Oral   Chew 1 tablet by mouth daily.         . cloNIDine (CATAPRES) 0.2 MG tablet   Oral   Take 1 tablet (0.2 mg total) by mouth 2 (two) times daily.   180 tablet   3   . ferrous sulfate 325 (65 FE) MG tablet   Oral   Take 325 mg by mouth 2 (two) times daily.         . fexofenadine (ALLEGRA) 180 MG tablet   Oral   Take 180 mg by mouth daily.           . nitroGLYCERIN (NITROSTAT) 0.4 MG SL tablet   Sublingual   Place 0.4 mg under the tongue every 5 (five) minutes as needed. If 3 doses does not relieve pain, call 911.         . polyethylene glycol powder (GLYCOLAX/MIRALAX) powder   Oral   Take 17 g  by mouth daily.   255 g   11   . traMADol (ULTRAM) 50 MG tablet   Oral   Take 1 tablet (50 mg total) by mouth every 6 (six) hours as needed.   120 tablet   0   . ursodiol (ACTIGALL) 300 MG capsule   Oral   Take 1 capsule (300 mg total) by mouth 2 (two) times daily.   180 capsule   3   . zolpidem (AMBIEN) 10 MG tablet      Take 1/2 to 1 tab at bedtime as needed for insomnia   90 tablet   0     BP 162/97  Pulse 77  Temp(Src) 97.7 F (36.5 C) (Oral)  Resp 18  SpO2 100%  Physical Exam  Constitutional: She is oriented to person, place, and time. She appears well-developed and well-nourished.  HENT:  Head: Normocephalic and atraumatic.  Eyes: EOM are normal. Pupils are equal, round, and reactive to light.  Neck: Neck supple.  Cardiovascular: Regular rhythm and intact distal pulses.   Pulmonary/Chest: Effort normal. No respiratory distress.  Genitourinary:  Rectal exam: small external hemorrhoid, no bleeding.   Musculoskeletal: Normal range of motion. She exhibits no edema.  Neurological: She is alert and oriented to person, place, and time.  Skin: Skin is warm and dry.    ED Course  Procedures (including critical care time)  Lidocaine jelly applied topically and RX provided for same, with constipation precautions and outpatient referral. Plan sitz baths, avoid constipation and outpatient follow up.    MDM  Hemorrhoid on exam  Medication and referral/ precautions provided  VS and nursing notes reviewed and considered        Sunnie Nielsen, MD 12/11/12 [phone]

## 2012-12-11 NOTE — ED Notes (Signed)
Pt states that she started having rectal pain last night. Pt denies hx of hemorrhoids. Pt denies blood in the stool. Pt denies itching. Pt is having pain when wiping rectum. Pt states "when I wipe, I can feel a knot." Pt does have pain when she sits down in a chair. Pt rates pain 5/10.

## 2012-12-18 ENCOUNTER — Encounter: Payer: Self-pay | Admitting: Family Medicine

## 2012-12-18 NOTE — Telephone Encounter (Signed)
Error

## 2012-12-18 NOTE — Telephone Encounter (Signed)
This encounter was created in error - please disregard.

## 2012-12-27 ENCOUNTER — Encounter: Payer: Self-pay | Admitting: Family Medicine

## 2012-12-27 ENCOUNTER — Ambulatory Visit (INDEPENDENT_AMBULATORY_CARE_PROVIDER_SITE_OTHER): Payer: Medicare PPO | Admitting: Family Medicine

## 2012-12-27 VITALS — BP 131/82 | HR 73 | Temp 98.3°F | Ht 64.0 in | Wt 208.0 lb

## 2012-12-27 DIAGNOSIS — K644 Residual hemorrhoidal skin tags: Secondary | ICD-10-CM

## 2012-12-27 DIAGNOSIS — Z23 Encounter for immunization: Secondary | ICD-10-CM

## 2012-12-27 NOTE — Patient Instructions (Addendum)
[name],   Thank you very much for coming in to see me today.  Take epsom salts soaks as needed for hemorrhoid. Call and come in for worsening pain.  Your hemorrhoid is external. If it causes you significant pain or becomes thrombosed it will need to be surgically excised.  Dr. Armen Pickup

## 2012-12-28 DIAGNOSIS — K644 Residual hemorrhoidal skin tags: Secondary | ICD-10-CM | POA: Insufficient documentation

## 2012-12-28 HISTORY — DX: Residual hemorrhoidal skin tags: K64.4

## 2012-12-28 NOTE — Progress Notes (Signed)
Subjective:     Patient ID: [name], female   DOB: 1956-09-22, 57 y.o.   MRN: [id number]  HPI 57 yo F presents for hemorrhoid f/u. She was seen in the ED for hemorrhoid. She denies pain and vaginal bleeding. She has stopped use of lidocaine gel and hydrocortisone cream.   Knee arthritis: patient has f/u scheduled with ortho. She still has low back and knee pain despite weight loss. She request temporary handicap placard.   Review of Systems As per HPI    Objective:   Physical Exam BP 131/82  Pulse 73  Temp(Src) 98.3 F (36.8 C) (Oral)  Ht 5\' 4"  (1.626 m)  Wt 208 lb (94.348 kg)  BMI 35.69 kg/m2 General appearance: alert, cooperative and no distress Pelvic: positive findings: extenral hemorrhoid at 12 o'clock. no rectal bleeding. slight TTP.    Assessment and Plan:

## 2012-12-28 NOTE — Assessment & Plan Note (Addendum)
A: no pain or thrombosis. P:  Take epsom salts soaks as needed for hemorrhoid. Call and come in for worsening pain.  Your hemorrhoid is external. If it causes you significant pain or becomes thrombosed it will need to be surgically excised.

## 2013-01-19 ENCOUNTER — Ambulatory Visit (INDEPENDENT_AMBULATORY_CARE_PROVIDER_SITE_OTHER): Payer: Medicare PPO | Admitting: Family Medicine

## 2013-01-19 ENCOUNTER — Encounter: Payer: Self-pay | Admitting: Family Medicine

## 2013-01-19 VITALS — BP 129/88 | HR 75 | Temp 98.0°F | Ht 64.0 in | Wt 201.0 lb

## 2013-01-19 DIAGNOSIS — M25519 Pain in unspecified shoulder: Secondary | ICD-10-CM | POA: Insufficient documentation

## 2013-01-19 DIAGNOSIS — E119 Type 2 diabetes mellitus without complications: Secondary | ICD-10-CM

## 2013-01-19 DIAGNOSIS — M25511 Pain in right shoulder: Secondary | ICD-10-CM

## 2013-01-19 LAB — POCT GLYCOSYLATED HEMOGLOBIN (HGB A1C): Hemoglobin A1C: 5.8

## 2013-01-19 NOTE — Assessment & Plan Note (Signed)
A:  Posterior R shoulder pain. No weakness or radicular symptoms. Infraspinatus muscle spasm  P:  Massage Tylenol  Consider muscle relaxer if symptoms persist/worsen

## 2013-01-19 NOTE — Progress Notes (Signed)
Subjective:     Patient ID: [name], female   DOB: [birth year], 57 y.o.   MRN: [id number]  HPI 57 yo F presents for same day visit to discuss the following:  1. R shoulder pain: posterior shoulder pain x 2 weeks. No injury. No heavy lifting. Pain is sharp, non radiating, severe, occurs every other day. Improved with rest. Nothing makes it worse. Patient tried topical antiinflammatory rub and tylenol w/o relief.   2. DM2: not taking medication. Continues to loose weight. No polyuria or polydipsia.   3. Labs: [employer] recommended some labs. Patient is unsure of which ones. Asked if I received a letter about this. I have not yet received it. One lab was lipid panel. Patient unsure of the rest.   4. Back brace: still with intermittent low back pain. Was contacted about a back brace. Wanted to know if I received the request.   Review of Systems As per HPI     Objective:   Physical Exam BP 129/88  Pulse 75  Temp(Src) 98 F (36.7 C) (Oral)  Ht 5\' 4"  (1.626 m)  Wt 201 lb (91.173 kg)  BMI 34.48 kg/m2 General appearance: alert, cooperative and no distress Shoulder: Inspection reveals no abnormalities, atrophy or asymmetry. Palpation reveals infraspinatus tenderness and palpable muscle spasm. There is no tenderness over AC joint or bicipital groove. ROM is full in all planes. Rotator cuff strength normal throughout. No signs of impingement with negative Neer and Hawkin's tests, empty can. Speeds and Yergason's tests normal. No labral pathology noted with negative Obrien's, negative clunk and good stability. Normal scapular function observed. No painful arc and no drop arm sign. No apprehension sign     Assessment and Plan:

## 2013-01-19 NOTE — Patient Instructions (Addendum)
[name],  Your A1c is great! Keep up the good work.  Your shoulder pain is a muscle spasm in the infraspinatus muscle. Your shoulder joint is normal.   I recommend massage. Use a tennis ball Or you can buy back hook massage.   If tylenol dose not help try ibuprofen 600 mg every 8 hrs for a 3-5 days.   Dr. Armen Pickup

## 2013-01-19 NOTE — Assessment & Plan Note (Signed)
A: normal A1c. Continued weight loss following bariatric surgery.  P: continue diet control.  Will continue yearly eye exam.

## 2013-01-29 ENCOUNTER — Other Ambulatory Visit: Payer: Medicare PPO

## 2013-01-29 ENCOUNTER — Telehealth: Payer: Self-pay | Admitting: Family Medicine

## 2013-01-29 DIAGNOSIS — L659 Nonscarring hair loss, unspecified: Secondary | ICD-10-CM

## 2013-01-29 NOTE — Telephone Encounter (Signed)
Pt is asking for a letter stating that she is in good health and is able to work at a daycare - would like to pick up tomorrow or asap

## 2013-01-29 NOTE — Telephone Encounter (Signed)
Called patient. Letter done and placed up front for pick up.  TSH ordered to eval for possible hypothyroidism, hair loss.

## 2013-01-29 NOTE — Progress Notes (Signed)
TSH DONE TODAY Richele Strand 

## 2013-01-30 ENCOUNTER — Other Ambulatory Visit: Payer: Self-pay | Admitting: Family Medicine

## 2013-01-30 ENCOUNTER — Telehealth: Payer: Self-pay | Admitting: Family Medicine

## 2013-01-30 LAB — TSH: TSH: 1.129 u[IU]/mL (ref 0.350–4.500)

## 2013-01-30 MED ORDER — ZOLPIDEM TARTRATE 10 MG PO TABS: ORAL_TABLET | ORAL | Status: DC

## 2013-01-30 NOTE — Telephone Encounter (Signed)
Normal TSH. Patient informed.

## 2013-01-30 NOTE — Telephone Encounter (Signed)
Phoned in [place] refill to [facility]

## 2013-02-05 ENCOUNTER — Other Ambulatory Visit: Payer: Self-pay | Admitting: *Deleted

## 2013-02-05 DIAGNOSIS — I10 Essential (primary) hypertension: Secondary | ICD-10-CM

## 2013-02-05 MED ORDER — SPIRONOLACTONE 25 MG PO TABS: 25.0000 mg | ORAL_TABLET | Freq: Every day | ORAL | Status: DC

## 2013-02-05 MED ORDER — CLONIDINE HCL 0.2 MG PO TABS: 0.2000 mg | ORAL_TABLET | Freq: Two times a day (BID) | ORAL | Status: DC

## 2013-02-09 ENCOUNTER — Telehealth: Payer: Self-pay | Admitting: Family Medicine

## 2013-02-09 MED ORDER — ATORVASTATIN CALCIUM 40 MG PO TABS: 40.0000 mg | ORAL_TABLET | Freq: Every day | ORAL | Status: DC

## 2013-02-09 NOTE — Telephone Encounter (Signed)
Called patient. Starting lipitor 40 mg daily for secondary prevention of stroke. Patient to call with questions or concerns.

## 2013-02-15 ENCOUNTER — Ambulatory Visit (INDEPENDENT_AMBULATORY_CARE_PROVIDER_SITE_OTHER): Payer: Medicare PPO | Admitting: Family Medicine

## 2013-02-15 ENCOUNTER — Telehealth: Payer: Self-pay | Admitting: Family Medicine

## 2013-02-15 ENCOUNTER — Encounter: Payer: Self-pay | Admitting: Family Medicine

## 2013-02-15 VITALS — BP 157/93 | HR 80 | Temp 97.9°F | Ht 64.0 in | Wt 201.0 lb

## 2013-02-15 DIAGNOSIS — J309 Allergic rhinitis, unspecified: Secondary | ICD-10-CM

## 2013-02-15 MED ORDER — AZELASTINE HCL 0.05 % OP SOLN: 1.0000 [drp] | Freq: Two times a day (BID) | OPHTHALMIC | Status: DC

## 2013-02-15 MED ORDER — FEXOFENADINE HCL 180 MG PO TABS: 180.0000 mg | ORAL_TABLET | Freq: Every day | ORAL | Status: DC

## 2013-02-15 NOTE — Telephone Encounter (Signed)
Staff medical report placed in Dr. Armen Pickup box for completion.  Ileana Ladd

## 2013-02-15 NOTE — Telephone Encounter (Signed)
Patient dropped off form to be filled out for her work.  Please call her when completed. °

## 2013-02-15 NOTE — Assessment & Plan Note (Signed)
Patient's clinical picture/symptoms consistent with allergic rhinitis. No evidence of conjunctivitis on exam. Patient advised to continue allegra.  Rx given for ophthalmic antihistamine drops to aid symptoms.

## 2013-02-15 NOTE — Progress Notes (Signed)
Subjective:     Patient ID: [name], female   DOB: 1956-05-20, 57 y.o.   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 - Patient complains of itchy watery eyes x 2 days.  - She reports history of allergies.  She is currently taking Benadryl and Allergra for her symptoms. - No runny nose.  Reports some cough/sore throat. - No redness/swelling of eyes.  Review of Systems Per HPI with the following additions: No fevers, chills, N/V.    Objective:   Physical Exam Filed Vitals:   02/15/13 0933  BP: 157/93  Pulse: 80  Temp: 97.9 F (36.6 C)   General: well appearing female in NAD. HEENT: Head: NCAT. Eyes - no drainage; conjunctiva normal.  Nose: no nasal discharge.  Throat: no pharyngeal erythema.     Assessment:        Plan:

## 2013-02-15 NOTE — Patient Instructions (Addendum)
  I have prescribed some drops for your eyes.  Please continue to take your allegra daily. If your symptoms worsen please don't hesitate to return,

## 2013-02-19 ENCOUNTER — Encounter: Payer: Self-pay | Admitting: *Deleted

## 2013-02-21 ENCOUNTER — Other Ambulatory Visit: Payer: Self-pay | Admitting: Family Medicine

## 2013-02-21 MED ORDER — FLUTICASONE PROPIONATE 50 MCG/ACT NA SUSP: 2.0000 | Freq: Every day | NASAL | Status: DC

## 2013-02-21 NOTE — Telephone Encounter (Signed)
Called patient. Form completed. Patient will pick up form tomorrow.

## 2013-02-22 ENCOUNTER — Ambulatory Visit (INDEPENDENT_AMBULATORY_CARE_PROVIDER_SITE_OTHER): Payer: Medicare PPO | Admitting: Family Medicine

## 2013-02-22 DIAGNOSIS — J208 Acute bronchitis due to other specified organisms: Secondary | ICD-10-CM | POA: Insufficient documentation

## 2013-02-22 DIAGNOSIS — H109 Unspecified conjunctivitis: Secondary | ICD-10-CM | POA: Insufficient documentation

## 2013-02-22 DIAGNOSIS — J209 Acute bronchitis, unspecified: Secondary | ICD-10-CM

## 2013-02-22 MED ORDER — POLYMYXIN B-TRIMETHOPRIM 10000-0.1 UNIT/ML-% OP SOLN: 1.0000 [drp] | Freq: Four times a day (QID) | OPHTHALMIC | Status: AC

## 2013-02-22 MED ORDER — ACETAMINOPHEN-CODEINE #2 300-15 MG PO TABS: 1.0000 | ORAL_TABLET | Freq: Two times a day (BID) | ORAL | Status: DC | PRN

## 2013-02-22 NOTE — Assessment & Plan Note (Signed)
A: appears to be allergic. No improvement with azelastine and exposure to children with pink eye. P: step up treatment to cover bacterial with polytrim QID x 4 days.

## 2013-02-22 NOTE — Patient Instructions (Signed)
[name],  Thank you for coming in today.  Your eyes do appear to be an allergic conjunctivitis. However, since your tried the first drops without relief. Apply the antibiotic drops ever 6  hrs for next 5 days.   For cold: Take tylenol #2 before, can take one other time but not before driving or working.  Continue mucinex.  F/u if you develop fever, worsening cough or chest pain.  Dr. Armen Pickup

## 2013-02-22 NOTE — Assessment & Plan Note (Signed)
A: mild. No dyspnea or wheezing. P: supportive care. Continue mucinex and corcidin HBP. Add tylenol with codeine for extra cough suppressant.

## 2013-02-22 NOTE — Progress Notes (Signed)
Subjective:     Patient ID: [name], female   DOB: June 03, 1956, 57 y.o.   MRN: [id number]  HPI 57 yo F presents for same day visit to discuss the following:  1. Eye redness: since last week. Children at school with pink eye. Taking azelastine w/o relief. Denies foreign body sensation and vision changes. Denies eye pain. Denies significant discharge. Eye redness and itching on lids.   2. Cough: x 5 days. Sister with cough and cold symptoms. Cough worse at night. No chest pain or SOB or fever. Taking mucinex and corcidin HBP w/o relief.   Review of Systems As per HPI     Objective:   Physical Exam There were no vitals taken for this visit. General appearance: alert, cooperative and no distress Eyes: negative findings: lids and lashes normal, corneas clear, pupils equal, round, reactive to light and accomodation, visual fields full to confrontation and no foreign body with everted lid, positive findings: conjunctiva: 1+ conjunctivitis on palpebral conjunctiva.  Ears: normal TM and external ear canal right ear and abnormal external canal left ear - ceruminosis impacting canal Nose: Nares normal. Septum midline. Mucosa normal. No drainage or sinus tenderness. Throat: lips, mucosa, and tongue normal; teeth and gums normal Lungs: clear to auscultation bilaterally Heart: regular rate and rhythm, S1, S2 normal, no murmur, click, rub or gallop    Assessment and Plan:

## 2013-02-23 NOTE — Telephone Encounter (Signed)
Ms. Shidler notified form is ready to be picked up at front desk.  Ileana Ladd

## 2013-02-23 NOTE — Telephone Encounter (Signed)
Forgot to give form to patient at visit yesterday. Form placed on donna's desk.

## 2013-03-07 DIAGNOSIS — D1720 Benign lipomatous neoplasm of skin and subcutaneous tissue of unspecified limb: Secondary | ICD-10-CM | POA: Insufficient documentation

## 2013-03-07 HISTORY — DX: Benign lipomatous neoplasm of skin and subcutaneous tissue of unspecified limb: D17.20

## 2013-03-20 ENCOUNTER — Telehealth: Payer: Self-pay | Admitting: Family Medicine

## 2013-03-20 NOTE — Telephone Encounter (Signed)
Pt hit her leg about a week ago and it is still sore and raised a little bit.  Wants to know what to do for this.

## 2013-03-20 NOTE — Telephone Encounter (Signed)
Returned call to pt - 2 weeks ago pt hit leg on corner of bed  - has tried peroxide and neosporin.  Advised if she has any signs of infection: warmth, redness, streaking, puss to make appointment immediately. Pt verbalized understanding. Wyatt Haste, RN-BSN

## 2013-04-25 ENCOUNTER — Other Ambulatory Visit: Payer: Self-pay

## 2013-04-25 DIAGNOSIS — Z1231 Encounter for screening mammogram for malignant neoplasm of breast: Secondary | ICD-10-CM

## 2013-04-26 ENCOUNTER — Encounter (HOSPITAL_COMMUNITY): Payer: Self-pay | Admitting: Emergency Medicine

## 2013-04-26 ENCOUNTER — Emergency Department (HOSPITAL_COMMUNITY)
Admission: EM | Admit: 2013-04-26 | Discharge: 2013-04-26 | Disposition: A | Discharge status: Home / Self Care | Payer: Medicare PPO | Attending: Emergency Medicine | Admitting: Emergency Medicine

## 2013-04-26 ENCOUNTER — Telehealth: Payer: Self-pay | Admitting: *Deleted

## 2013-04-26 DIAGNOSIS — Z8673 Personal history of transient ischemic attack (TIA), and cerebral infarction without residual deficits: Secondary | ICD-10-CM | POA: Insufficient documentation

## 2013-04-26 DIAGNOSIS — K625 Hemorrhage of anus and rectum: Secondary | ICD-10-CM

## 2013-04-26 DIAGNOSIS — Z8739 Personal history of other diseases of the musculoskeletal system and connective tissue: Secondary | ICD-10-CM | POA: Insufficient documentation

## 2013-04-26 DIAGNOSIS — D649 Anemia, unspecified: Secondary | ICD-10-CM | POA: Insufficient documentation

## 2013-04-26 DIAGNOSIS — Z8719 Personal history of other diseases of the digestive system: Secondary | ICD-10-CM | POA: Insufficient documentation

## 2013-04-26 DIAGNOSIS — Z79899 Other long term (current) drug therapy: Secondary | ICD-10-CM | POA: Insufficient documentation

## 2013-04-26 DIAGNOSIS — Z8619 Personal history of other infectious and parasitic diseases: Secondary | ICD-10-CM | POA: Insufficient documentation

## 2013-04-26 DIAGNOSIS — Z9884 Bariatric surgery status: Secondary | ICD-10-CM | POA: Insufficient documentation

## 2013-04-26 DIAGNOSIS — I872 Venous insufficiency (chronic) (peripheral): Secondary | ICD-10-CM | POA: Insufficient documentation

## 2013-04-26 DIAGNOSIS — I10 Essential (primary) hypertension: Secondary | ICD-10-CM | POA: Insufficient documentation

## 2013-04-26 LAB — POCT I-STAT, CHEM 8
BUN: 23 mg/dL (ref 6–23)
Calcium, Ion: 1.17 mmol/L (ref 1.12–1.23)
Chloride: 109 mEq/L (ref 96–112)
Creatinine, Ser: 0.8 mg/dL (ref 0.50–1.10)
Glucose, Bld: 150 mg/dL — ABNORMAL HIGH (ref 70–99)
HCT: 28 % — ABNORMAL LOW (ref 36.0–46.0)
Hemoglobin: 9.5 g/dL — ABNORMAL LOW (ref 12.0–15.0)
Potassium: 4.2 mEq/L (ref 3.5–5.1)
Sodium: 145 mEq/L (ref 135–145)
TCO2: 22 mmol/L (ref 0–100)

## 2013-04-26 LAB — URINE MICROSCOPIC-ADD ON

## 2013-04-26 LAB — URINALYSIS, ROUTINE W REFLEX MICROSCOPIC
Glucose, UA: NEGATIVE mg/dL
Hgb urine dipstick: NEGATIVE
Ketones, ur: 15 mg/dL — AB
Nitrite: NEGATIVE
Protein, ur: NEGATIVE mg/dL
Specific Gravity, Urine: 1.034 — ABNORMAL HIGH (ref 1.005–1.030)
Urobilinogen, UA: 1 mg/dL (ref 0.0–1.0)
pH: 5 (ref 5.0–8.0)

## 2013-04-26 LAB — GLUCOSE, CAPILLARY: Glucose-Capillary: 191 mg/dL — ABNORMAL HIGH (ref 70–99)

## 2013-04-26 LAB — CBC WITH DIFFERENTIAL/PLATELET
Basophils Absolute: 0 10*3/uL (ref 0.0–0.1)
Basophils Relative: 0 % (ref 0–1)
Eosinophils Absolute: 0 10*3/uL (ref 0.0–0.7)
Eosinophils Relative: 0 % (ref 0–5)
HCT: 29 % — ABNORMAL LOW (ref 36.0–46.0)
Hemoglobin: 9.5 g/dL — ABNORMAL LOW (ref 12.0–15.0)
Lymphocytes Relative: 23 % (ref 12–46)
Lymphs Abs: 1.4 10*3/uL (ref 0.7–4.0)
MCH: 29.9 pg (ref 26.0–34.0)
MCHC: 32.8 g/dL (ref 30.0–36.0)
MCV: 91.2 fL (ref 78.0–100.0)
Monocytes Absolute: 0.3 10*3/uL (ref 0.1–1.0)
Monocytes Relative: 5 % (ref 3–12)
Neutro Abs: 4.2 10*3/uL (ref 1.7–7.7)
Neutrophils Relative %: 72 % (ref 43–77)
Platelets: 208 10*3/uL (ref 150–400)
RBC: 3.18 MIL/uL — ABNORMAL LOW (ref 3.87–5.11)
RDW: 14.6 % (ref 11.5–15.5)
WBC: 5.9 10*3/uL (ref 4.0–10.5)

## 2013-04-26 LAB — TYPE AND SCREEN
ABO/RH(D): O POS
Antibody Screen: NEGATIVE

## 2013-04-26 NOTE — ED Provider Notes (Signed)
History    CSN: [id number] Arrival date & time 04/26/13  1016  First MD Initiated Contact with Patient 04/26/13 1033     Chief Complaint  Patient presents with  . Rectal Bleeding   (Consider location/radiation/quality/duration/timing/severity/associated sxs/prior Treatment) HPI  [name] 57 year old female with a past medical history significant for bleeding hemorrhoid, previous gastric bypass who presents the emergency department chief complaint of rectal bleeding, nausea and vomiting.  Patient states that this morning at 3 AM she awoke to use the restroom.  Return to the bathroom she had significant rectal bleeding that filled the pole with blood.  Patient denies any rectal pain.  She did have some crampy lower abdominal pain.  She said this was mild, did not radiate.  The patient had a second episode of significant rectal bleeding about 2 hours later she had associated nausea without vomiting at that time. Denies fevers, chills, myalgias, arthralgias. Denies DOE, SOB, chest tightness or pressure, radiation to left arm, jaw or back, or diaphoresis. Denies dysuria, flank pain, suprapubic pain, frequency, urgency, or hematuria. Denies headaches, light headedness, weakness, visual disturbances.  Past Medical History  Diagnosis Date  . Anemia   . Hypertension   . Diverticulosis of colon with hemorrhage 2001    4  previous hospital admissions for bleeding  . Stroke   . Tubular adenoma of colon 2009    s/p biopsy no high grade dysplasia or malignancy identified.   . Cervical cancer screening 12/01/2011  . VENOUS INSUFFICIENCY, CHRONIC 12/29/2006    Qualifier: Diagnosis of  By: Bebe Shaggy    . Plantar fasciitis 01/28/2011  . Muscle cramps at night 02/23/2011  . H. pylori infection 02/09/2012    Diagnosed at [facility]. Treated with prevpac   . FOOT PAIN, BILATERAL 04/07/2010    Qualifier: Diagnosis of  By: Sandi Mealy  MD, [name] Cornfield    . 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 CHEST PAIN, ATYPICAL 10/13/2009    Qualifier: Diagnosis of  By: Sandi Mealy  MD, [name] Cornfield    . Bullae 10/30/2012   Past Surgical History  Procedure Laterality Date  . Abdominal hysterectomy    . Laparoscopic hysterectomy  1977    left ovaries, took uterus, no cervix on exam (1/13)  . Knee replacements  2003  . 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 Cancer Father 58    Died from complications of colon CA  . Diabetes Mother   . Hypertension Mother   . Diabetes Sister   . Heart disease Sister   . Diabetes Sister   . Stroke Sister   . Diabetes Brother   . Stroke Brother   . Diabetes Brother   . Diabetes Brother   . Diabetes Brother    History  Substance Use Topics  . Smoking status: Never Smoker   . Smokeless tobacco: Never Used  . Alcohol Use: No   OB History   Grav Para Term Preterm Abortions TAB SAB Ect Mult Living                 Review of Systems Ten systems reviewed and are negative for acute change, except as noted in the HPI.   Allergies  Aspirin and Nsaids  Home Medications   Current Outpatient Rx  Name  Route  Sig  Dispense  Refill  . acetaminophen (TYLENOL) 650 MG CR tablet   Oral   Take  1 tablet (650 mg total) by mouth 2 (two) times daily as needed. As needed for pain. May take 3 times a day if needed   90 tablet   2   . acetaminophen-codeine (TYLENOL #2) 300-15 MG per tablet   Oral   Take 1 tablet by mouth 2 (two) times daily as needed for pain (and cough).   20 tablet   0   . atorvastatin (LIPITOR) 40 MG tablet   Oral   Take 1 tablet (40 mg total) by mouth daily.   90 tablet   3   . azelastine (OPTIVAR) 0.05 % ophthalmic solution   Both Eyes   Place 1 drop into both eyes 2 (two) times daily.   6 mL   2   . b complex vitamins tablet   Oral   Take 1 tablet by mouth daily.   90 tablet   2   . calcium carbonate (OS-CAL) 1250 MG chewable tablet   Oral   Chew 1  tablet by mouth daily.         . cloNIDine (CATAPRES) 0.2 MG tablet   Oral   Take 1 tablet (0.2 mg total) by mouth 2 (two) times daily.   180 tablet   3   . docusate sodium (COLACE) 100 MG capsule   Oral   Take 100 mg by mouth 2 (two) times daily as needed for constipation. For constipation         . esomeprazole (NEXIUM) 40 MG capsule   Oral   Take 40 mg by mouth daily before breakfast.         . ferrous sulfate 325 (65 FE) MG tablet   Oral   Take 325 mg by mouth 2 (two) times daily.         . fexofenadine (ALLEGRA) 180 MG tablet   Oral   Take 1 tablet (180 mg total) by mouth daily.   90 tablet   3   . fluticasone (FLONASE) 50 MCG/ACT nasal spray   Nasal   Place 2 sprays into the nose daily.   16 g   6   . isosorbide dinitrate (ISORDIL) 20 MG tablet   Oral   Take 20 mg by mouth 2 (two) times daily.         . methocarbamol (ROBAXIN) 500 MG tablet   Oral   Take 500 mg by mouth 4 (four) times daily as needed. For pain         . nitroGLYCERIN (NITROSTAT) 0.4 MG SL tablet   Sublingual   Place 0.4 mg under the tongue every 5 (five) minutes x 3 doses as needed. If 3 doses does not relieve pain, call 911.         . polyethylene glycol powder (GLYCOLAX/MIRALAX) powder   Oral   Take 17 g by mouth daily.   255 g   11   . spironolactone (ALDACTONE) 25 MG tablet   Oral   Take 1 tablet (25 mg total) by mouth daily.   90 tablet   3   . traMADol (ULTRAM) 50 MG tablet   Oral   Take 1 tablet (50 mg total) by mouth every 6 (six) hours as needed.   120 tablet   0   . ursodiol (ACTIGALL) 300 MG capsule   Oral   Take 1 capsule (300 mg total) by mouth 2 (two) times daily.   180 capsule   3   . zolpidem (AMBIEN) 10 MG tablet  Oral   Take 10 mg by mouth at bedtime as needed for sleep.          BP 97/59  Pulse 70  Resp 22  SpO2 100% Physical Exam Physical Exam  Nursing note and vitals reviewed. Constitutional: She is oriented to person, place,  and time. She appears well-developed and well-nourished. No distress.  HENT:  Head: Normocephalic and atraumatic.  Eyes: Conjunctivae normal and EOM are normal. Pupils are equal, round, and reactive to light. No scleral icterus.  Neck: Normal range of motion.  Cardiovascular: Normal rate, regular rhythm and normal heart sounds.  Exam reveals no gallop and no friction rub.   No murmur heard. Pulmonary/Chest: Effort normal and breath sounds normal. No respiratory distress.  Abdominal: Soft. Bowel sounds are normal. She exhibits no distension and no mass. There is no tenderness. There is no guarding.  Neurological: She is alert and oriented to person, place, and time.  Skin: Skin is warm and dry. She is not diaphoretic.    ED Course  Procedures (including critical care time) Labs Reviewed  CBC WITH DIFFERENTIAL - Abnormal; Notable for the following:    RBC 3.18 (*)    Hemoglobin 9.5 (*)    HCT 29.0 (*)    All other components within normal limits  GLUCOSE, CAPILLARY - Abnormal; Notable for the following:    Glucose-Capillary 191 (*)    All other components within normal limits  POCT I-STAT, CHEM 8 - Abnormal; Notable for the following:    Glucose, Bld 150 (*)    Hemoglobin 9.5 (*)    HCT 28.0 (*)    All other components within normal limits  URINALYSIS, ROUTINE W REFLEX MICROSCOPIC  TYPE AND SCREEN   No results found. 1. Rectal bleed     MDM  Patient with slight anemia, out last recorded cbc wax about 1 year ago. Unsure if this represent acute drop, however there.  She has bloody stool on rectal exam and no need for occult blood test. Patient deneis dizziness. She has a known bleeding hemorrhoid, however patient was concerned because she's had a previous colonoscopy which showed polyps in the colon.  Patient has primary care followup.  Who she sees to discharge home at this point.  Like her to have close followup to evaluate her hemoglobin and see if there is any further acute drop  especially if she continues to have bloody stools.  The patient should followup with gastroenterology for reevaluation of her:.The patient appears reasonably screened and/or stabilized for discharge and I doubt any other medical condition or other [facility] requiring further screening, evaluation, or treatment in the ED at this time prior to discharge.    Arthor Captain, PA-C 04/26/13 1641

## 2013-04-26 NOTE — ED Notes (Signed)
Pt reports noting large amount of dark red rectal bleeding since 0300. Pt arrives nauseated, vomiting, diaphoretic. Pt reports hx of blood transfusion and gastric bypass. Pt alert, orientedx4, moderate distress in triage.

## 2013-04-26 NOTE — ED Notes (Signed)
Pt discharged.Vital signs stable and GCS 15 

## 2013-04-26 NOTE — Telephone Encounter (Signed)
Pt reports bright red bleeding starting at 3 am last night - would like to know if she should go to ED or come to the clininc Hannah Haste, RN-BSN

## 2013-04-26 NOTE — ED Notes (Signed)
Pt report 4am bowel movement with large amount of bright red blood.  Pt has hx of gi bleeds denies surgeyr for same.   Pt  Denies pain.  Pt alert oriented X4

## 2013-04-26 NOTE — Telephone Encounter (Signed)
After speaking with Dr. Armen Pickup she instructed that pt go to ED. Pt called back and told to go to ED for further eval.Pt verbalized understanding. Wyatt Haste, RN-BSN

## 2013-04-27 ENCOUNTER — Ambulatory Visit (INDEPENDENT_AMBULATORY_CARE_PROVIDER_SITE_OTHER): Payer: Medicare PPO | Admitting: Family Medicine

## 2013-04-27 VITALS — BP 115/70 | HR 91 | Temp 98.8°F | Ht 64.0 in | Wt 196.0 lb

## 2013-04-27 DIAGNOSIS — K625 Hemorrhage of anus and rectum: Secondary | ICD-10-CM | POA: Insufficient documentation

## 2013-04-27 DIAGNOSIS — D509 Iron deficiency anemia, unspecified: Secondary | ICD-10-CM

## 2013-04-27 LAB — POCT HEMOGLOBIN: Hemoglobin: 8.2 g/dL — AB (ref 12.2–16.2)

## 2013-04-27 NOTE — Assessment & Plan Note (Signed)
On rectal exam, she has gross blood but dried blood.  POCT Hgb was 8.2.  No need for transfusion today, however she is symptomatic - mild SOB and fatigue.  No angina.  Placed a referral for GI today - will set that up next week when Lupita Leash returns.   Discussed red flags at length, see AVS.  Patient to return to clinic on Tuesday to repeat CBC and make sure symptoms are improving.  Patient understands signs/symptoms that would require prompt return to clinic or ER.

## 2013-04-27 NOTE — Assessment & Plan Note (Signed)
Increase iron to TID.  Repeat CBC this Tuesday.

## 2013-04-27 NOTE — Patient Instructions (Addendum)
It was nice to meet you, [name]. Your hemoblobin was 8.2 today. I have ordered a GI referral and we will call you early next week to schedule that appointment. In the meantime, continue to take your Iron pills and Nexium daily. Schedule a follow up appointment with Dr. Armen Pickup next week to check your Hemoglobin again and make sure you're feeling better. IF YOU DEVELOP CHEST PAIN, SHORTNESS OF BREATH, OR LOSE CONSCIOUSNESS, PLEASE GO TO ER.

## 2013-04-27 NOTE — Progress Notes (Signed)
  Subjective:    Patient ID: [name], female    DOB: Sep 17, 1956, 57 y.o.   MRN: [id number]  HPI  Patient presents to same day appointment for rectal bleeding.  She was seen in ED yesterday and found to be bleeding from rectum and anemic.  She has a hx of diverticulosis, but does not seen a GI specialist outpatient.  Since yesterday, she has not seen any blood from rectum.  She does complain of abdominal cramping, dizziness with standing, and weakness - this started yesterday.  She lost consciousness yesterday prior to ED visit.  She has not had any pre-syncope or syncope since then.  Denies CP but did have some SOB last night.  She knows she a hx of anemia and has been taking iron supplements BID for several years.  Review of Systems Per HPI    Objective:   Physical Exam  Vitals reviewed. Constitutional:  Appears tired  HENT:  Mouth/Throat: Oropharynx is clear and moist.  Cardiovascular: Normal rate.   Pulmonary/Chest: Effort normal.  Abdominal: Soft. Bowel sounds are normal. She exhibits no distension. There is tenderness. There is no rebound and no guarding.  Diffuse tenderness abdomen  Skin: No rash noted. No pallor.      Assessment & Plan:

## 2013-04-28 NOTE — ED Provider Notes (Signed)
Medical screening examination/treatment/procedure(s) were performed by non-physician practitioner and as supervising physician I was immediately available for consultation/collaboration.  Raeford Razor, MD 04/28/13 1659

## 2013-04-30 ENCOUNTER — Encounter: Payer: Self-pay | Admitting: Gastroenterology

## 2013-05-01 ENCOUNTER — Telehealth: Payer: Self-pay | Admitting: Gastroenterology

## 2013-05-01 ENCOUNTER — Telehealth: Payer: Self-pay | Admitting: Family Medicine

## 2013-05-01 MED ORDER — FERROUS SULFATE 300 (60 FE) MG/5ML PO SYRP: 300.0000 mg | ORAL_SOLUTION | Freq: Every day | ORAL | Status: DC

## 2013-05-01 NOTE — Telephone Encounter (Signed)
Called patient. No recurrent lower GI bleed. Had GI appt tomorrow AM before appt with me. Will update me regarding plan. Patient needs colonoscopy.   Patient requested liquid iron.

## 2013-05-01 NOTE — Telephone Encounter (Signed)
Pt states she was seen in the hospital recently for rectal bleeding, states she was told she needs to have a colonoscopy. Pt had last colon with Dr. Arlyce Dice in March of 2009. Pt requesting to be seen sooner than 1st available, would like to get colon scheduled if needed. Pt scheduled to see Mike Gip PA tomorrow at 10am. Pt aware of appt date and time.

## 2013-05-02 ENCOUNTER — Ambulatory Visit (INDEPENDENT_AMBULATORY_CARE_PROVIDER_SITE_OTHER): Payer: Medicare PPO | Admitting: Physician Assistant

## 2013-05-02 ENCOUNTER — Other Ambulatory Visit: Payer: Self-pay | Admitting: *Deleted

## 2013-05-02 ENCOUNTER — Other Ambulatory Visit (INDEPENDENT_AMBULATORY_CARE_PROVIDER_SITE_OTHER): Payer: Medicare PPO

## 2013-05-02 ENCOUNTER — Encounter: Payer: Self-pay | Admitting: Family Medicine

## 2013-05-02 ENCOUNTER — Ambulatory Visit (INDEPENDENT_AMBULATORY_CARE_PROVIDER_SITE_OTHER): Payer: Medicare PPO | Admitting: Family Medicine

## 2013-05-02 ENCOUNTER — Encounter: Payer: Self-pay | Admitting: Physician Assistant

## 2013-05-02 VITALS — BP 138/87 | HR 82 | Temp 98.0°F | Ht 64.0 in | Wt 198.0 lb

## 2013-05-02 VITALS — BP 134/80 | HR 100 | Ht 64.0 in | Wt 200.0 lb

## 2013-05-02 DIAGNOSIS — K625 Hemorrhage of anus and rectum: Secondary | ICD-10-CM

## 2013-05-02 DIAGNOSIS — R5381 Other malaise: Secondary | ICD-10-CM

## 2013-05-02 DIAGNOSIS — K5731 Diverticulosis of large intestine without perforation or abscess with bleeding: Secondary | ICD-10-CM

## 2013-05-02 DIAGNOSIS — D62 Acute posthemorrhagic anemia: Secondary | ICD-10-CM

## 2013-05-02 DIAGNOSIS — D649 Anemia, unspecified: Secondary | ICD-10-CM

## 2013-05-02 DIAGNOSIS — R531 Weakness: Secondary | ICD-10-CM

## 2013-05-02 DIAGNOSIS — E119 Type 2 diabetes mellitus without complications: Secondary | ICD-10-CM

## 2013-05-02 DIAGNOSIS — Z800 Family history of malignant neoplasm of digestive organs: Secondary | ICD-10-CM

## 2013-05-02 LAB — CBC WITH DIFFERENTIAL/PLATELET
Basophils Absolute: 0 10*3/uL (ref 0.0–0.1)
Basophils Relative: 0.3 % (ref 0.0–3.0)
Eosinophils Absolute: 0.2 10*3/uL (ref 0.0–0.7)
Eosinophils Relative: 3.2 % (ref 0.0–5.0)
HCT: 26.3 % — ABNORMAL LOW (ref 36.0–46.0)
Hemoglobin: 8.7 g/dL — ABNORMAL LOW (ref 12.0–15.0)
Lymphocytes Relative: 34.5 % (ref 12.0–46.0)
Lymphs Abs: 2.1 10*3/uL (ref 0.7–4.0)
MCHC: 33.1 g/dL (ref 30.0–36.0)
MCV: 93.2 fl (ref 78.0–100.0)
Monocytes Absolute: 0.5 10*3/uL (ref 0.1–1.0)
Monocytes Relative: 8.4 % (ref 3.0–12.0)
Neutro Abs: 3.2 10*3/uL (ref 1.4–7.7)
Neutrophils Relative %: 53.6 % (ref 43.0–77.0)
Platelets: 267 10*3/uL (ref 150.0–400.0)
RBC: 2.82 Mil/uL — ABNORMAL LOW (ref 3.87–5.11)
RDW: 15.1 % — ABNORMAL HIGH (ref 11.5–14.6)
WBC: 6 10*3/uL (ref 4.5–10.5)

## 2013-05-02 LAB — POCT GLYCOSYLATED HEMOGLOBIN (HGB A1C): Hemoglobin A1C: 6

## 2013-05-02 MED ORDER — POLYETHYLENE GLYCOL 3350 17 GM/SCOOP PO POWD: 17.0000 g | Freq: Two times a day (BID) | ORAL | Status: DC

## 2013-05-02 MED ORDER — MOVIPREP 100 G PO SOLR: 1.0000 | Freq: Once | ORAL | Status: DC

## 2013-05-02 NOTE — Assessment & Plan Note (Signed)
A: stable H/H. Bleeding has decreased. Suspect diverticular bleed.  P: Patient to have colonoscopy in 6 days.  Reviewed s/s to prompt return to care sooner.

## 2013-05-02 NOTE — Progress Notes (Signed)
Subjective:     Patient ID: [name], female   DOB: 24-Jul-1956, 56 y.o.   MRN: [id number]  HPI 57 year old female presents for follow visit to discuss the following:  #1 lower GI bleed: patient with recurrent lower GI bleed over the last week. The bleeding has slowed down since onset. Patient did have a visit with her gastroenterologist this morning who present to colonoscopy in 6 days. Patient reports that the bleeding associated with constipation, one small bowel movement daily. She denies associated pain, fever, chills. She has a history of diverticulosis and diverticulitis. She she admits to a bit offatigue and a bit lightheaded upon standing.  #2 diabetes: Diet-controlled since patient saw significant weight following gastric bypass surgery. She last saw her eye doctor 10 months ago. She says that she is due for an eye exam and will schedule it.  Review of Systems As per HPI     Objective:   Physical Exam BP 138/87  Pulse 82  Temp(Src) 98 F (36.7 C) (Oral)  Ht 5\' 4"  (1.626 m)  Wt 198 lb (89.812 kg)  BMI 33.97 kg/m2 General appearance: alert, cooperative and no distress Abdomen: soft, non-tender; bowel sounds normal; no masses,  no organomegaly  Lab Results  Component Value Date   WBC 6.0 05/02/2013   HGB 8.7 Repeated and verified X2.* 05/02/2013   HCT 26.3 Repeated and verified X2.* 05/02/2013   MCV 93.2 05/02/2013   PLT 267.0 05/02/2013   Lab Results  Component Value Date   HGBA1C 6.0 05/02/2013        Assessment and Plan:

## 2013-05-02 NOTE — Assessment & Plan Note (Signed)
A: remains well controlled. P: Patient to have f/u eye exam.

## 2013-05-02 NOTE — Progress Notes (Signed)
Subjective:    Patient ID: [name], female    DOB: 12-24-1955, 57 y.o.   MRN: [id number]  HPI  [name] is a very nice 57 year old Afro-American female who is referred by her PCP Dr. Tye Savoy, Family medicine at [facility] for further evaluation of rectal bleeding Patient says that she has history of recurrent lower GI bleeding. She was seen in the emergency room on 04/26/2013 after she says that she woke up at 3 AM that morning with an urge for bowel movement and then passed a large amount of bright red blood per rectum with clots. She says she had about 4 episodes prior to going to the emergency room and had another episode there in the ER. She was discharged home that same day. Labs showed a WBC of 5.9 hemoglobin 9.5 hematocrit of 29 and an MCV of 91.2. Review of previous labs showed that her hemoglobin in November of 2013 was 10.5 hematocrit of 31.5. She also had iron studies done in August of 2013 which were normal. Patient says she was surprised she was released from the emergency room as she had had a lot of bleeding. However she has not had any further active bleeding since that time. She says she's been somewhat constipated and stays on iron so her stools are darker but she has not seen any further gross blood. She has no complaints of abdominal pain. She does complain of generalized weakness and dizziness intermittently over the past several days. She's not on any regular aspirin or NSAIDs. She does have history of diabetes mellitus and a prior CVA/question TIA., Hypertension and underwent a gastric bypass done in [place] in July of 2013 . Review of peptic records show that she had colonoscopy by Dr. Randa Evens in 2001 positive for pandiverticulosis, endoscopy was done at that same time and was unremarkable. She had admission in 2007 and had colonoscopy with Dr. Arlyce Dice revealing pan diverticular disease. She also had an admission in 2008 and about 3 admissions in 2009 [place] current diverticular  bleeding but did not undergo repeat colonoscopy at that time. There was discussion about doing a partial colectomy but to date that has not occurred She has not had an admission over the past 2 years for bleeding. Her family history is positive for colon cancer in her father.     Review of Systems  Constitutional: Positive for fatigue.  HENT: Negative.   Eyes: Negative.   Respiratory: Negative.   Cardiovascular: Negative.   Gastrointestinal: Positive for blood in stool.  Endocrine: Negative.   Genitourinary: Negative.   Musculoskeletal: Negative.   Skin: Negative.   Allergic/Immunologic: Negative.   Neurological: Positive for weakness.  Hematological: Negative.   Psychiatric/Behavioral: Negative.    Outpatient Prescriptions Prior to Visit  Medication Sig Dispense Refill  . acetaminophen (TYLENOL) 650 MG CR tablet Take 1 tablet (650 mg total) by mouth 2 (two) times daily as needed. As needed for pain. May take 3 times a day if needed  90 tablet  2  . acetaminophen-codeine (TYLENOL #2) 300-15 MG per tablet Take 1 tablet by mouth 2 (two) times daily as needed for pain (and cough).  20 tablet  0  . atorvastatin (LIPITOR) 40 MG tablet Take 1 tablet (40 mg total) by mouth daily.  90 tablet  3  . azelastine (OPTIVAR) 0.05 % ophthalmic solution Place 1 drop into both eyes 2 (two) times daily.  6 mL  2  . b complex vitamins tablet Take 1 tablet by  mouth daily.  90 tablet  2  . calcium carbonate (OS-CAL) 1250 MG chewable tablet Chew 1 tablet by mouth daily.      . cloNIDine (CATAPRES) 0.2 MG tablet Take 1 tablet (0.2 mg total) by mouth 2 (two) times daily.  180 tablet  3  . docusate sodium (COLACE) 100 MG capsule Take 100 mg by mouth 2 (two) times daily as needed for constipation. For constipation      . esomeprazole (NEXIUM) 40 MG capsule Take 40 mg by mouth daily before breakfast.      . ferrous sulfate 300 (60 FE) MG/5ML syrup Take 5 mLs (300 mg total) by mouth daily. 30 minutes before  breakfast. Mix with 1/2 glass of orange juice.  150 mL  3  . fexofenadine (ALLEGRA) 180 MG tablet Take 1 tablet (180 mg total) by mouth daily.  90 tablet  3  . fluticasone (FLONASE) 50 MCG/ACT nasal spray Place 2 sprays into the nose daily.  16 g  6  . isosorbide dinitrate (ISORDIL) 20 MG tablet Take 20 mg by mouth 2 (two) times daily.      . methocarbamol (ROBAXIN) 500 MG tablet Take 500 mg by mouth 4 (four) times daily as needed. For pain      . nitroGLYCERIN (NITROSTAT) 0.4 MG SL tablet Place 0.4 mg under the tongue every 5 (five) minutes x 3 doses as needed. If 3 doses does not relieve pain, call 911.      . polyethylene glycol powder (GLYCOLAX/MIRALAX) powder Take 17 g by mouth daily.  255 g  11  . spironolactone (ALDACTONE) 25 MG tablet Take 1 tablet (25 mg total) by mouth daily.  90 tablet  3  . traMADol (ULTRAM) 50 MG tablet Take 1 tablet (50 mg total) by mouth every 6 (six) hours as needed.  120 tablet  0  . ursodiol (ACTIGALL) 300 MG capsule Take 1 capsule (300 mg total) by mouth 2 (two) times daily.  180 capsule  3  . zolpidem (AMBIEN) 10 MG tablet Take 10 mg by mouth at bedtime as needed for sleep.       No facility-administered medications prior to visit.   Allergies  Allergen Reactions  . Aspirin Other (See Comments)    REACTION: GI BLEED !  AVOID NSAID'S As well  Was taking ibuprofen at the same time.   . Nsaids Other (See Comments)    REACTION: GI bleed Second time with    Patient Active Problem List   Diagnosis Date Noted  . Rectal bleeding 04/27/2013  . Conjunctivitis of both eyes 02/22/2013  . Acute viral bronchitis 02/22/2013  . Pain in joint, shoulder region 01/19/2013  . External hemorrhoids without complication 12/28/2012  . Back pain 10/04/2012  . Insomnia 08/29/2012  . GERD (gastroesophageal reflux disease) 08/24/2012  . S/P gastric bypass 06/07/2012  . Asthma, cough variant 03/10/2012  . Diverticulosis of colon with hemorrhage   . Hypopigmented skin lesion  02/23/2011  . 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 ALLERGIC RHINITIS CAUSE UNSPECIFIED 05/27/2010  . POSTMENOPAUSAL STATUS 04/07/2010  . LIBIDO, DECREASED 10/16/2009  . Diabetes mellitus 01/25/2008  . CYST, KIDNEY, ACQUIRED 08/15/2007  . CEREBROVASCULAR ACCIDENT, HX OF 07/26/2007  . HTN, goal below 140/90 05/12/2007  . OBESITY, NOS 12/29/2006  . ANEMIA, IRON DEFICIENCY, UNSPEC. 12/29/2006   History  Substance Use Topics  . Smoking status: Never Smoker   . Smokeless tobacco: Never Used  . Alcohol Use: No   family history includes Cancer (age  of onset: 67) in her father; Diabetes in her brothers, mother, and sisters; Heart disease in her sister; Hypertension in her mother; and Stroke in her brother and sister.     Objective:   Physical Exam  well-developed African American female in no acute distress, pleasant blood pressure 134/80 pulse 100 height 5 foot 4 weight 200. HEENT; nontraumatic normocephalic EOMI PERRLA sclera anicteric, Neck;Supple no JVD, Cardiovascula;r regular rate and rhythm with S1-S2 no murmur or gallop, Pulmonary; clear bilaterally, Abdomen; soft, nontender, nondistended, bowel sounds are active there is no palpable mass or hepatosplenomegaly, Rectal; exam grayish stool which is Hemoccult positive, Extremities; 1+ edema in the feet bilaterally Psych; mood and affect normal and appropriate.        Assessment & Plan:  #75 57 year old African American female with history of recurrent diverticular bleeding with multiple prior admissions over the past 15 years and known pan diverticular disease. Patient has had another episode of GI bleeding 1 week ago but did not require hospitalization and this was very likely another diverticular hemorrhage. 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 Leeroy Bock discussed in detail with the patient and she is agreeable to proceed. Patient is advised to push fluids and avoid aspirin and NSAIDs indefinitely Patient was being considered for colectomy in 2009 after several episodes of diverticular bleeding. If she has another episode of bleeding in the near future she should have surgical consultation

## 2013-05-02 NOTE — Patient Instructions (Signed)
[name],  Thank you for coming in today. The hemoglobin is stable. I have changed her MiraLAX to twice daily as needed for constipation.  I will wait to add any additional stool softeners until you've had your colonoscopy.  Dr. Armen Pickup

## 2013-05-02 NOTE — Patient Instructions (Addendum)
We faxed a prescription to [facility] for the colonoscopy prep. You have been scheduled for a colonoscopy with propofol. Please follow written instructions given to you at your visit today.  Please pick up your prep kit at the pharmacy within the next 1-3 days. If you use inhalers (even only as needed), please bring them with you on the day of your procedure. Your physician has requested that you go to www.startemmi.com and enter the access code given to you at your visit today. This web site gives a general overview about your procedure. However, you should still follow specific instructions given to you by our office regarding your preparation for the procedure.

## 2013-05-06 NOTE — Progress Notes (Signed)
Reviewed and agree with management. Ethyn Schetter D. Belia Febo, M.D., FACG  

## 2013-05-07 ENCOUNTER — Encounter: Payer: Self-pay | Admitting: Gastroenterology

## 2013-05-08 ENCOUNTER — Encounter: Payer: Self-pay | Admitting: Gastroenterology

## 2013-05-08 ENCOUNTER — Ambulatory Visit (AMBULATORY_SURGERY_CENTER): Payer: Medicare PPO | Admitting: Gastroenterology

## 2013-05-08 VITALS — BP 143/95 | HR 55 | Temp 97.3°F | Resp 14 | Ht 64.0 in | Wt 200.0 lb

## 2013-05-08 DIAGNOSIS — Z800 Family history of malignant neoplasm of digestive organs: Secondary | ICD-10-CM

## 2013-05-08 DIAGNOSIS — D126 Benign neoplasm of colon, unspecified: Secondary | ICD-10-CM

## 2013-05-08 DIAGNOSIS — Z86010 Personal history of colonic polyps: Secondary | ICD-10-CM

## 2013-05-08 DIAGNOSIS — K5731 Diverticulosis of large intestine without perforation or abscess with bleeding: Secondary | ICD-10-CM

## 2013-05-08 MED ORDER — SODIUM CHLORIDE 0.9 % IV SOLN: 500.0000 mL | INTRAVENOUS | Status: DC

## 2013-05-08 NOTE — Op Note (Signed)
[facility] [address] [place], [address]   COLONOSCOPY PROCEDURE REPORT  PATIENT: [name], [name]  MR#: [id number] BIRTHDATE: Feb 12, 1956 , 57  yrs. old GENDER: Female ENDOSCOPIST: Louis Meckel, MD REFERRED BY: PROCEDURE DATE:  05/08/2013 PROCEDURE:   Colonoscopy with snare polypectomy ASA CLASS:   Class II INDICATIONS:hematochezia, Patient's immediate family history of colon cancer, and Patient's personal history of adenomatous colon polyps. MEDICATIONS: MAC sedation, administered by CRNA and propofol (Diprivan) 200mg  IV  DESCRIPTION OF PROCEDURE:   After the risks benefits and alternatives of the procedure were thoroughly explained, informed consent was obtained.  A digital rectal exam revealed no abnormalities of the rectum.   The LB [id number]  endoscope was introduced through the anus and advanced to the cecum, which was identified by the ileocecal valve. No adverse events experienced.   The quality of the prep was Suprep fair  The instrument was then slowly withdrawn as the colon was fully examined.      COLON FINDINGS: A sessile polyp was found in the sigmoid colon.  A polypectomy was performed with a cold snare.  The resection was complete and the polyp tissue was completely retrieved.   Severe diverticulosis was noted in the sigmoid colon, descending colon, transverse colon, and ascending colon.   The colon mucosa was otherwise normal.  Retroflexed views revealed no abnormalities. The time to cecum=4 minutes 35 seconds.  Withdrawal time=9 minutes 45 seconds.  The scope was withdrawn and the procedure completed. COMPLICATIONS: There were no complications.  ENDOSCOPIC IMPRESSION: 1.   Sessile polyp was found in the sigmoid colon; polypectomy was performed with a cold snare 2.   Severe diverticulosis was noted in the sigmoid colon, descending colon, transverse colon, and ascending colon 3.   The colon mucosa was otherwise  normal  Rectal bleeding secondary to diverticular bleed  RECOMMENDATIONS: Given your significant family history of colon cancer, you should have a repeat colonoscopy in 5 years   eSigned:  Louis Meckel, MD 05/08/2013 9:48 AM   cc: Dessa Phi, MD and Zelphia Cairo MD   PATIENT NAME:  [name], [name] MR#: [id number]

## 2013-05-08 NOTE — Progress Notes (Signed)
Patient did not experience any of the following events: a burn prior to discharge; a fall within the facility; wrong site/side/patient/procedure/implant event; or a hospital transfer or hospital admission upon discharge from the facility. (G8907) Patient did not have preoperative order for IV antibiotic SSI prophylaxis. (G8918)  

## 2013-05-08 NOTE — Patient Instructions (Signed)
YOU HAD AN ENDOSCOPIC PROCEDURE TODAY AT THE [facility]: Refer to the procedure report that was given to you for any specific questions about what was found during the examination.  If the procedure report does not answer your questions, please call your gastroenterologist to clarify.  If you requested that your care partner not be given the details of your procedure findings, then the procedure report has been included in a sealed envelope for you to review at your convenience later.  YOU SHOULD EXPECT: Some feelings of bloating in the abdomen. Passage of more gas than usual.  Walking can help get rid of the air that was put into your GI tract during the procedure and reduce the bloating. If you had a lower endoscopy (such as a colonoscopy or flexible sigmoidoscopy) you may notice spotting of blood in your stool or on the toilet paper. If you underwent a bowel prep for your procedure, then you may not have a normal bowel movement for a few days.  DIET: Your first meal following the procedure should be a light meal and then it is ok to progress to your normal diet.  A half-sandwich or bowl of soup is an example of a good first meal.  Heavy or fried foods are harder to digest and may make you feel nauseous or bloated.  Likewise meals heavy in dairy and vegetables can cause extra gas to form and this can also increase the bloating.  Drink plenty of fluids but you should avoid alcoholic beverages for 24 hours.  ACTIVITY: Your care partner should take you home directly after the procedure.  You should plan to take it easy, moving slowly for the rest of the day.  You can resume normal activity the day after the procedure however you should NOT DRIVE or use heavy machinery for 24 hours (because of the sedation medicines used during the test).    SYMPTOMS TO REPORT IMMEDIATELY: A gastroenterologist can be reached at any hour.  During normal business hours, 8:30 AM to 5:00 PM Monday through Friday,  call [phone].  After hours and on weekends, please call the GI answering service at [phone] who will take a message and have the physician on call contact you.   Following lower endoscopy (colonoscopy or flexible sigmoidoscopy):  Excessive amounts of blood in the stool  Significant tenderness or worsening of abdominal pains  Swelling of the abdomen that is new, acute  Fever of 100F or higher   FOLLOW UP: If any biopsies were taken you will be contacted by phone or by letter within the next 1-3 weeks.  Call your gastroenterologist if you have not heard about the biopsies in 3 weeks.  Our staff will call the home number listed on your records the next business day following your procedure to check on you and address any questions or concerns that you may have at that time regarding the information given to you following your procedure. This is a courtesy call and so if there is no answer at the home number and we have not heard from you through the emergency physician on call, we will assume that you have returned to your regular daily activities without incident.  SIGNATURES/CONFIDENTIALITY: You and/or your care partner have signed paperwork which will be entered into your electronic medical record.  These signatures attest to the fact that that the information above on your After Visit Summary has been reviewed and is understood.  Full responsibility of the confidentiality of   this discharge information lies with you and/or your care-partner.  INFORMATION ON POLYPS AND DIVERTICULOSIS GIVEN TO YOU TODAY 

## 2013-05-09 ENCOUNTER — Telehealth: Payer: Self-pay

## 2013-05-09 NOTE — Telephone Encounter (Signed)
  Follow up Call-  Call back number 05/08/2013  Post procedure Call Back phone  # [phone]  Permission to leave phone message Yes    [facility]

## 2013-05-10 ENCOUNTER — Other Ambulatory Visit: Payer: Self-pay

## 2013-05-14 ENCOUNTER — Telehealth: Payer: Self-pay | Admitting: *Deleted

## 2013-05-14 NOTE — Telephone Encounter (Signed)
Spoke with patient and she forgot about the labs. She will come for repeat CBC.

## 2013-05-14 NOTE — Telephone Encounter (Signed)
Message copied by Daphine Deutscher on Mon May 14, 2013 10:53 AM ------      Message from: Daphine Deutscher      Created: Wed May 02, 2013  1:52 PM       Did patient get lab for AE ------

## 2013-05-16 ENCOUNTER — Telehealth: Payer: Self-pay | Admitting: Family Medicine

## 2013-05-16 ENCOUNTER — Ambulatory Visit: Payer: Medicare PPO | Admitting: Gastroenterology

## 2013-05-16 NOTE — Telephone Encounter (Signed)
Pt is having pains in shoulder and going down arm on right side. This has been going on for 3 days. Wants to know dr can give her something or does she need to be seen Please advise

## 2013-05-16 NOTE — Telephone Encounter (Signed)
Called pt back.  Pain meds help.  Does not endorse CP, SOB, nausea or vomiting.  Appt made for Monday but given red flags to go to ED or call for a workin. Kimmie Berggren, Maryjo Rochester

## 2013-05-18 ENCOUNTER — Encounter: Payer: Self-pay | Admitting: Gastroenterology

## 2013-05-21 ENCOUNTER — Encounter: Payer: Self-pay | Admitting: *Deleted

## 2013-05-21 ENCOUNTER — Ambulatory Visit (INDEPENDENT_AMBULATORY_CARE_PROVIDER_SITE_OTHER): Payer: Medicare PPO | Admitting: Family Medicine

## 2013-05-21 ENCOUNTER — Telehealth: Payer: Self-pay | Admitting: *Deleted

## 2013-05-21 ENCOUNTER — Encounter: Payer: Self-pay | Admitting: Family Medicine

## 2013-05-21 VITALS — BP 123/75 | HR 73 | Temp 98.7°F | Ht 64.0 in | Wt 197.0 lb

## 2013-05-21 DIAGNOSIS — IMO0002 Reserved for concepts with insufficient information to code with codable children: Secondary | ICD-10-CM

## 2013-05-21 DIAGNOSIS — L259 Unspecified contact dermatitis, unspecified cause: Secondary | ICD-10-CM

## 2013-05-21 DIAGNOSIS — R21 Rash and other nonspecific skin eruption: Secondary | ICD-10-CM

## 2013-05-21 DIAGNOSIS — M7550 Bursitis of unspecified shoulder: Secondary | ICD-10-CM

## 2013-05-21 DIAGNOSIS — L309 Dermatitis, unspecified: Secondary | ICD-10-CM | POA: Insufficient documentation

## 2013-05-21 DIAGNOSIS — M75100 Unspecified rotator cuff tear or rupture of unspecified shoulder, not specified as traumatic: Secondary | ICD-10-CM

## 2013-05-21 DIAGNOSIS — M7551 Bursitis of right shoulder: Secondary | ICD-10-CM

## 2013-05-21 HISTORY — DX: Bursitis of unspecified shoulder: M75.50

## 2013-05-21 LAB — POCT SKIN KOH: Skin KOH, POC: NEGATIVE

## 2013-05-21 MED ORDER — METHYLPREDNISOLONE ACETATE 40 MG/ML IJ SUSP
40.0000 mg | Freq: Once | INTRAMUSCULAR | Status: AC
  Administered 2013-05-21: 40 mg via INTRA_ARTICULAR

## 2013-05-21 MED ORDER — TRIAMCINOLONE ACETONIDE 0.1 % EX CREA: TOPICAL_CREAM | Freq: Two times a day (BID) | CUTANEOUS | Status: DC

## 2013-05-21 NOTE — Telephone Encounter (Signed)
Patient has not had labs done for Mike Gip, PA. Letter mailed to patient.

## 2013-05-21 NOTE — Patient Instructions (Addendum)
[name],  Thank you for coming in today.  I gave you a shot of Depo-Medrol 40 and 2 % lidocaine in your R shoulder joint into the subacriomal space.  Ice your shoulder and no exercise or heavy lifting today.   Starting tomorrow:  Start shoulder bursitis rehab exercise provided if pain has improved.  Call if pain returns.   For skin rash:  KOH negative. Apply kenalog BID for next two weeks. Do not apply to open area.   F/u in 4 weeks to recheck rash.   Dr. Armen Pickup

## 2013-05-22 NOTE — Assessment & Plan Note (Signed)
A: Patient's exam is consistent with subacromial bursitis. P: Treated with corticosteroid injection. An outpatient ice for the next day. Provided patient a handout from sports medicine patient advisor for shoulder rehabilitation.

## 2013-05-22 NOTE — Progress Notes (Signed)
Subjective:     Patient ID: [name], female   DOB: 21-Jun-1956, 57 y.o.   MRN: [id number]  HPI 57 year old female with history diabetes type 2, hypertension, morbid obesity status post gastric bypass surgery, right shoulder pain presents for follow visit to discuss the following:  #1 right shoulder pain: Patient endorses 3 weeks of right shoulder pain. The pain started gradually one night after working out earlier in the day. Pain is located in the lateral right shoulder it is exacerbated by lying on the right side in by raising the right arm overhead. Pain is associated with tingling and numbness down the lateral right arm. Patient denies weakness. Patient has tried tramadol without relief of symptoms. Pain is 10 out of 10 at worse. Pain is currently 10 out of 10. The patient's has had to limit her exercises due to pain in her right shoulder.  #2 neck rash: Patient recovered in neck rash for the past 2 months. She does be her hair relaxed but denies rash associated with relaxer. She's tried over-the-counter hydrocortisone cream without relief of symptoms. As mentioned above the rash is pruritic and she noticed a slight burning sensation on her posterior neck when she applied hydrocortisone cream.  Review of Systems As per HPI     Objective:   Physical Exam .BP 123/75  Pulse 73  Temp(Src) 98.7 F (37.1 C) (Oral)  Ht 5\' 4"  (1.626 m)  Wt 197 lb (89.359 kg)  BMI 33.8 kg/m2 Wt Readings from Last 3 Encounters:  05/21/13 197 lb (89.359 kg)  05/08/13 200 lb (90.719 kg)  05/02/13 198 lb (89.812 kg)  General appearance: alert, cooperative and no distress Skin: scaly xerotic hyperpigemented rash around neck. one 1.5x1.5 circular area of excoriation on R posterior neck.  Shoulder: Right   Inspection reveals no abnormalities, atrophy or asymmetry. Palpation is normal with no tenderness over AC joint or bicipital groove. TTP in sub acromion space.  ROM is decreased in abduction.  Rotator cuff  strength is normal on right 5/5 throughout. No signs of impingement with negative Neer and Hawkin's tests,  + empty can. No labral pathology noted with good stability  Normal scapular function observed. No painful arc and no drop arm sign. No apprehension sign  KOH skin scraping of neck rash: negative   After obtaining informed consent and cleaning in the usual sterile fashion a steroid injection was performed at R subacromial space using a lateral approach using 2 cc of 2% plain Lidocaine and 40 mg of Depo-Medrol. This was well tolerated. Patient reported the pain reduction from 10/10 to 5/10 within 1 minute post injection.    Assessment and Plan:

## 2013-05-24 ENCOUNTER — Ambulatory Visit
Admission: RE | Admit: 2013-05-24 | Discharge: 2013-05-24 | Disposition: A | Discharge status: Home / Self Care | Payer: Medicare PPO | Source: Ambulatory Visit

## 2013-05-24 DIAGNOSIS — Z1231 Encounter for screening mammogram for malignant neoplasm of breast: Secondary | ICD-10-CM

## 2013-06-01 ENCOUNTER — Other Ambulatory Visit (INDEPENDENT_AMBULATORY_CARE_PROVIDER_SITE_OTHER): Payer: Medicare PPO

## 2013-06-01 DIAGNOSIS — D649 Anemia, unspecified: Secondary | ICD-10-CM

## 2013-06-01 LAB — CBC WITH DIFFERENTIAL/PLATELET
Basophils Absolute: 0 10*3/uL (ref 0.0–0.1)
Basophils Relative: 0.5 % (ref 0.0–3.0)
Eosinophils Absolute: 0.2 10*3/uL (ref 0.0–0.7)
Eosinophils Relative: 4.6 % (ref 0.0–5.0)
HCT: 34.8 % — ABNORMAL LOW (ref 36.0–46.0)
Hemoglobin: 11.3 g/dL — ABNORMAL LOW (ref 12.0–15.0)
Lymphocytes Relative: 45.9 % (ref 12.0–46.0)
Lymphs Abs: 2.3 10*3/uL (ref 0.7–4.0)
MCHC: 32.6 g/dL (ref 30.0–36.0)
MCV: 95.8 fl (ref 78.0–100.0)
Monocytes Absolute: 0.4 10*3/uL (ref 0.1–1.0)
Monocytes Relative: 7.6 % (ref 3.0–12.0)
Neutro Abs: 2 10*3/uL (ref 1.4–7.7)
Neutrophils Relative %: 41.4 % — ABNORMAL LOW (ref 43.0–77.0)
Platelets: 250 10*3/uL (ref 150.0–400.0)
RBC: 3.63 Mil/uL — ABNORMAL LOW (ref 3.87–5.11)
RDW: 15.1 % — ABNORMAL HIGH (ref 11.5–14.6)
WBC: 4.9 10*3/uL (ref 4.5–10.5)

## 2013-06-14 ENCOUNTER — Telehealth: Payer: Self-pay | Admitting: Family Medicine

## 2013-06-19 NOTE — Telephone Encounter (Signed)
error 

## 2013-07-13 ENCOUNTER — Ambulatory Visit: Payer: Commercial Managed Care - HMO

## 2013-07-24 ENCOUNTER — Telehealth: Payer: Self-pay | Admitting: Family Medicine

## 2013-07-24 NOTE — Telephone Encounter (Signed)
Pt would like Dr. Armen Pickup to write her prescription to get the shingles hot and she can pick it up from [place]. JW

## 2013-07-24 NOTE — Telephone Encounter (Signed)
Will forward to MD. Jazmin Hartsell,CMA  

## 2013-07-25 MED ORDER — ZOSTER VACCINE LIVE 19400 UNT/0.65ML ~~LOC~~ SOLR: 0.6500 mL | Freq: Once | SUBCUTANEOUS | Status: DC

## 2013-07-25 NOTE — Telephone Encounter (Signed)
rx printed. rx with handout placed up front for pick up.  Please inform patient.

## 2013-07-25 NOTE — Telephone Encounter (Signed)
Pt informed but states that she also requested one on [place].  See phone note in her chart. [place], Maryjo Rochester

## 2013-08-01 DIAGNOSIS — M75120 Complete rotator cuff tear or rupture of unspecified shoulder, not specified as traumatic: Secondary | ICD-10-CM

## 2013-08-01 DIAGNOSIS — M7540 Impingement syndrome of unspecified shoulder: Secondary | ICD-10-CM

## 2013-08-01 DIAGNOSIS — M19011 Primary osteoarthritis, right shoulder: Secondary | ICD-10-CM

## 2013-08-01 DIAGNOSIS — M25819 Other specified joint disorders, unspecified shoulder: Secondary | ICD-10-CM

## 2013-08-01 HISTORY — DX: Other specified joint disorders, unspecified shoulder: M25.819

## 2013-08-01 HISTORY — DX: Impingement syndrome of unspecified shoulder: M75.40

## 2013-08-01 HISTORY — DX: Primary osteoarthritis, right shoulder: M19.011

## 2013-08-01 HISTORY — DX: Complete rotator cuff tear or rupture of unspecified shoulder, not specified as traumatic: M75.120

## 2013-08-02 ENCOUNTER — Encounter: Payer: Self-pay | Admitting: *Deleted

## 2013-08-02 ENCOUNTER — Encounter (HOSPITAL_BASED_OUTPATIENT_CLINIC_OR_DEPARTMENT_OTHER): Payer: Self-pay | Admitting: *Deleted

## 2013-08-02 NOTE — Pre-Procedure Instructions (Signed)
To come for BMET; EKG done 09/24/2012

## 2013-08-06 ENCOUNTER — Other Ambulatory Visit: Payer: Self-pay | Admitting: Orthopedic Surgery

## 2013-08-08 ENCOUNTER — Encounter (HOSPITAL_BASED_OUTPATIENT_CLINIC_OR_DEPARTMENT_OTHER)
Admission: RE | Admit: 2013-08-08 | Discharge: 2013-08-08 | Disposition: A | Discharge status: Home / Self Care | Payer: Medicare PPO | Source: Ambulatory Visit | Attending: Orthopedic Surgery | Admitting: Orthopedic Surgery

## 2013-08-08 LAB — BASIC METABOLIC PANEL
BUN: 15 mg/dL (ref 6–23)
CO2: 27 mEq/L (ref 19–32)
Calcium: 9.5 mg/dL (ref 8.4–10.5)
Chloride: 106 mEq/L (ref 96–112)
Creatinine, Ser: 0.62 mg/dL (ref 0.50–1.10)
GFR calc Af Amer: >90 mL/min (ref >90)
GFR calc non Af Amer: >90 mL/min (ref >90)
Glucose, Bld: 82 mg/dL (ref 70–99)
Potassium: 4.2 mEq/L (ref 3.5–5.1)
Sodium: 141 mEq/L (ref 135–145)

## 2013-08-08 NOTE — H&P (Signed)
[name] [place] [id number] [phone] A Division of [facility]  [name], M.D.   Robert A. Thurston Hole, M.D.   Burnell Blanks, M.D.   Eulas Post, M.D.   Lunette Stands, M.D Buford Dresser, M.D.  Charlsie Quest, M.D.   Estell Harpin, M.D.   Melina Fiddler, M.D. Genene Churn. Barry Dienes, PA-C            Kirstin A. Shepperson, PA-C Josh [place], PA-C [place], [place]   RE: [name], [name]   [id number]      DOB: [birth year] PROGRESS NOTE: 06-05-13 Reason for visit:  Follow-up bilateral total knee arthroplasties as well as new complaint of right shoulder pain.    History of present illness: She had bilateral total knees done in 2000.  The left is doing very well.  The right has occasionally had some aching pain.  She had a cortisone injection earlier at the beginning of the year in March and this helped.  However, she has started to have some pain in the anterior aspect of the knee.  She denies any erythema.  She denies any draining wounds.  With regards to her right shoulder, she has had significant pain with activity.  She had no injury that she recalls but has been unable to work above her head.  She had a cortisone injection by Dr. Armen Pickup last week; however, it has not gotten any better.  It did briefly get better after the injection but it is back to hurting the same amount.      Please see associated documentation for this clinic visit for further past medical, family, surgical and social history, review of systems, and exam findings as this was reviewed by me.  EXAMINATION: Well appearing female no apparent distress.  On the left knee, the incisions are benign and well healed.  She is distally neurovascularly intact.  On the right knee, she is neurovascularly intact. Her incision is benign. She has some mild aching at the front of the knee.  She has well preserved range of motion.  She has no effusion.  On the right shoulder, she is neurovascularly  intact.  She does have some pain in the deltoid patch.  She does not have significant tenderness to palpation but she does have a positive Hawkins sign.  She has a positive drop arm sign.  She has some mild weakness in empty can Jobst testing.    ASSESSMENT: Patient with mild right knee pain in the setting of total knee arthroplasty as well as possible rotator cuff injury to the right shoulder.  PLAN: 1. I performed an aspiration and injection to the right knee of cortisone.  Aspiration was minimal fluid that was serous with no purulence and no cloudiness.  She was given a knee sleeve. 2. On the right shoulder, we will obtain an MRI to further evaluate this.   3. She will continue activity as tolerated but take it easy.   4. We will follow-up with her after the MRI.    Continued--- Kyreese Chio/[facility] [address]   [address] [place],  [id number] [phone] A Division of [facility]  [name], M.D.   Robert A. Thurston Hole, M.D.   Burnell Blanks, M.D.   Eulas Post, M.D.   Lunette Stands, M.D Buford Dresser, M.D.  Charlsie Quest, M.D.   Estell Harpin, M.D.   Melina Fiddler, M.D. Genene Churn. Barry Dienes, PA-C  Kirstin A. Shepperson, PA-C Josh [place], PA-C [place], [place]   RE: [name], [name]   [id number]      DOB: [birth year] PROGRESS NOTE: 06-05-13 Page 2  The patient's clinical condition is marked by substantial pain and/or significant functional disability. Other conservative therapy has not provided relief, is contraindicated, or not appropriate. There is a reasonable likelihood that injection will significantly improve the patient's pain and/or functional disability.   After routine sterile prep of the knee with use of ethyl chloride and 1% plain Xylocaine through a medial parapatellar approach the knee is entered without difficulty. The knee is injected with 2:6 Depo-Medrol/Marcaine, accomplished  atraumatically.  Patient tolerated the procedure well.    Jewel Baize.  Eulah Pont, M.D.  Electronically verified by Jewel Baize. Eulah Pont, M.D. TDM:gde D 06-05-13 T 06-07-13  Grae Cannata/[facility] [address]   [address] [place], [place] [address] [phone] A Division of [facility]  [name], M.D.   Robert A. Thurston Hole, M.D.   Burnell Blanks, M.D.   Eulas Post, M.D.   Lunette Stands, M.D Jewel Baize. Eulah Pont, M.D.  Buford Dresser, M.D.  Charlsie Quest, M.D.  Estell Harpin, M.D.   Melina Fiddler, M.D. Danford Bad. Willa Rough, PA-C  Kirstin A. Shepperson, PA-C  Josh Sylvester, PA-C [place], [place]   RE: [name], [name]                                [id number]      DOB: 30-Apr-1956 PHONE NOTE:  06-15-13 I went over [name] MRI.  I have called her on the phone and discussed it with her.  She has had off and on impingement and cuff issues in the right shoulder going back to 2006.  Recent MRI essentially shows full thickness tear.  We have discussed exam under anesthesia, arthroscopy, decompression and rotator cuff repair.  We are going to go ahead and proceed with scheduling.  I will see her at the time of operative intervention.  I have told her if she wants to come in to discuss this further face-to-face I would be happy to go over it with her.  She will let me know.  [name], M.D.   Electronically verified by [name], M.D. DFM:jjh D 06-15-13 T 06-18-13

## 2013-08-09 ENCOUNTER — Ambulatory Visit (HOSPITAL_BASED_OUTPATIENT_CLINIC_OR_DEPARTMENT_OTHER)
Admission: RE | Admit: 2013-08-09 | Discharge: 2013-08-09 | Disposition: A | Discharge status: Home / Self Care | Payer: Medicare PPO | Source: Ambulatory Visit | Attending: Orthopedic Surgery | Admitting: Orthopedic Surgery

## 2013-08-09 ENCOUNTER — Encounter (HOSPITAL_BASED_OUTPATIENT_CLINIC_OR_DEPARTMENT_OTHER): Payer: Self-pay | Admitting: *Deleted

## 2013-08-09 ENCOUNTER — Encounter (HOSPITAL_BASED_OUTPATIENT_CLINIC_OR_DEPARTMENT_OTHER): Payer: Medicare PPO | Admitting: Anesthesiology

## 2013-08-09 ENCOUNTER — Encounter (HOSPITAL_BASED_OUTPATIENT_CLINIC_OR_DEPARTMENT_OTHER)
Admission: RE | Disposition: A | Discharge status: Home / Self Care | Payer: Self-pay | Source: Ambulatory Visit | Attending: Orthopedic Surgery

## 2013-08-09 ENCOUNTER — Ambulatory Visit (HOSPITAL_BASED_OUTPATIENT_CLINIC_OR_DEPARTMENT_OTHER): Payer: Medicare PPO | Admitting: Anesthesiology

## 2013-08-09 DIAGNOSIS — M19019 Primary osteoarthritis, unspecified shoulder: Secondary | ICD-10-CM | POA: Insufficient documentation

## 2013-08-09 DIAGNOSIS — M719 Bursopathy, unspecified: Secondary | ICD-10-CM | POA: Insufficient documentation

## 2013-08-09 DIAGNOSIS — M249 Joint derangement, unspecified: Secondary | ICD-10-CM | POA: Insufficient documentation

## 2013-08-09 DIAGNOSIS — M25511 Pain in right shoulder: Secondary | ICD-10-CM

## 2013-08-09 DIAGNOSIS — M25819 Other specified joint disorders, unspecified shoulder: Secondary | ICD-10-CM | POA: Insufficient documentation

## 2013-08-09 DIAGNOSIS — M67919 Unspecified disorder of synovium and tendon, unspecified shoulder: Secondary | ICD-10-CM | POA: Insufficient documentation

## 2013-08-09 HISTORY — DX: Complete rotator cuff tear or rupture of unspecified shoulder, not specified as traumatic: M75.120

## 2013-08-09 HISTORY — PX: SHOULDER ARTHROSCOPY WITH ROTATOR CUFF REPAIR AND SUBACROMIAL DECOMPRESSION: SHX5686

## 2013-08-09 HISTORY — DX: Pure hypercholesterolemia, unspecified: E78.00

## 2013-08-09 HISTORY — DX: Presence of dental prosthetic device (complete) (partial): Z97.2

## 2013-08-09 HISTORY — DX: Gastro-esophageal reflux disease without esophagitis: K21.9

## 2013-08-09 HISTORY — DX: Primary osteoarthritis, right shoulder: M19.011

## 2013-08-09 HISTORY — DX: Impingement syndrome of unspecified shoulder: M75.40

## 2013-08-09 HISTORY — DX: Cerebral infarction, unspecified: I63.9

## 2013-08-09 HISTORY — DX: Unspecified osteoarthritis, unspecified site: M19.90

## 2013-08-09 HISTORY — DX: Transient cerebral ischemic attack, unspecified: G45.9

## 2013-08-09 HISTORY — DX: Personal history of other diseases of the digestive system: Z87.19

## 2013-08-09 LAB — POCT HEMOGLOBIN-HEMACUE: Hemoglobin: 12.3 g/dL (ref 12.0–15.0)

## 2013-08-09 SURGERY — SHOULDER ARTHROSCOPY WITH ROTATOR CUFF REPAIR AND SUBACROMIAL DECOMPRESSION
Anesthesia: General | Site: Shoulder | Laterality: Right | Wound class: Clean

## 2013-08-09 MED ORDER — METOCLOPRAMIDE HCL 5 MG/ML IJ SOLN: 10.0000 mg | Freq: Once | INTRAMUSCULAR | Status: DC | PRN

## 2013-08-09 MED ORDER — OXYCODONE HCL 5 MG PO TABS
5.0000 mg | ORAL_TABLET | Freq: Once | ORAL | Status: AC | PRN
  Administered 2013-08-09: 5 mg via ORAL

## 2013-08-09 MED ORDER — DEXTROSE-NACL 5-0.45 % IV SOLN: INTRAVENOUS | Status: DC

## 2013-08-09 MED ORDER — MIDAZOLAM HCL 2 MG/2ML IJ SOLN
1.0000 mg | INTRAMUSCULAR | Status: DC | PRN
  Administered 2013-08-09: 1 mg via INTRAVENOUS

## 2013-08-09 MED ORDER — ONDANSETRON HCL 4 MG/2ML IJ SOLN
INTRAMUSCULAR | Status: DC | PRN
  Administered 2013-08-09: 4 mg via INTRAMUSCULAR

## 2013-08-09 MED ORDER — SODIUM CHLORIDE 0.9 % IR SOLN
Status: DC | PRN
  Administered 2013-08-09: 15000 mL

## 2013-08-09 MED ORDER — DEXAMETHASONE SODIUM PHOSPHATE 4 MG/ML IJ SOLN
INTRAMUSCULAR | Status: DC | PRN
  Administered 2013-08-09: 10 mg via INTRAVENOUS

## 2013-08-09 MED ORDER — PROPOFOL 10 MG/ML IV BOLUS
INTRAVENOUS | Status: DC | PRN
  Administered 2013-08-09: 200 mg via INTRAVENOUS

## 2013-08-09 MED ORDER — LACTATED RINGERS IV SOLN
INTRAVENOUS | Status: DC
  Administered 2013-08-09 (×2): via INTRAVENOUS

## 2013-08-09 MED ORDER — HYDROMORPHONE HCL PF 1 MG/ML IJ SOLN
0.2500 mg | INTRAMUSCULAR | Status: DC | PRN
  Administered 2013-08-09: 0.25 mg via INTRAVENOUS
  Administered 2013-08-09: 0.5 mg via INTRAVENOUS

## 2013-08-09 MED ORDER — FENTANYL CITRATE 0.05 MG/ML IJ SOLN
50.0000 ug | INTRAMUSCULAR | Status: DC | PRN
  Administered 2013-08-09: 50 ug via INTRAVENOUS

## 2013-08-09 MED ORDER — SUCCINYLCHOLINE CHLORIDE 20 MG/ML IJ SOLN
INTRAMUSCULAR | Status: DC | PRN
  Administered 2013-08-09: 100 mg via INTRAVENOUS

## 2013-08-09 MED ORDER — OXYCODONE HCL 5 MG/5ML PO SOLN: 5.0000 mg | Freq: Once | ORAL | Status: AC | PRN

## 2013-08-09 MED ORDER — CEFAZOLIN SODIUM-DEXTROSE 2-3 GM-% IV SOLR
2.0000 g | INTRAVENOUS | Status: AC
  Administered 2013-08-09: 2 g via INTRAVENOUS

## 2013-08-09 MED ORDER — BUPIVACAINE HCL (PF) 0.25 % IJ SOLN
INTRAMUSCULAR | Status: DC | PRN
  Administered 2013-08-09: 12 mL

## 2013-08-09 MED ORDER — ACETAMINOPHEN 500 MG PO TABS
1000.0000 mg | ORAL_TABLET | Freq: Once | ORAL | Status: AC
  Administered 2013-08-09: 1000 mg via ORAL

## 2013-08-09 MED ORDER — CHLORHEXIDINE GLUCONATE 4 % EX LIQD: 60.0000 mL | Freq: Once | CUTANEOUS | Status: DC

## 2013-08-09 MED ORDER — LABETALOL HCL 5 MG/ML IV SOLN
INTRAVENOUS | Status: DC | PRN
  Administered 2013-08-09 (×2): 5 mg via INTRAVENOUS
  Administered 2013-08-09: 2.5 mg via INTRAVENOUS

## 2013-08-09 MED ORDER — FENTANYL CITRATE 0.05 MG/ML IJ SOLN
INTRAMUSCULAR | Status: DC | PRN
  Administered 2013-08-09: 50 ug via INTRAVENOUS
  Administered 2013-08-09 (×2): 25 ug via INTRAVENOUS

## 2013-08-09 MED ORDER — OXYCODONE-ACETAMINOPHEN 7.5-325 MG PO TABS: 1.0000 | ORAL_TABLET | ORAL | Status: DC | PRN

## 2013-08-09 MED ORDER — MIDAZOLAM HCL 2 MG/ML PO SYRP: 12.0000 mg | ORAL_SOLUTION | Freq: Once | ORAL | Status: DC | PRN

## 2013-08-09 MED ORDER — LIDOCAINE HCL (CARDIAC) 20 MG/ML IV SOLN
INTRAVENOUS | Status: DC | PRN
  Administered 2013-08-09: 50 mg via INTRAVENOUS

## 2013-08-09 SURGICAL SUPPLY — 74 items
ANCH SUT SWLK 19.1X5.5 CLS EL (Anchor) ×2 IMPLANT
ANCHOR PEEK SWIVEL LOCK 5.5 (Anchor) ×2 IMPLANT
APL SKNCLS STERI-STRIP NONHPOA (GAUZE/BANDAGES/DRESSINGS)
BENZOIN TINCTURE PRP APPL 2/3 (GAUZE/BANDAGES/DRESSINGS) IMPLANT
BLADE CUTTER GATOR 3.5 (BLADE) ×2 IMPLANT
BLADE CUTTER MENIS 5.5 (BLADE) IMPLANT
BLADE GREAT WHITE 4.2 (BLADE) ×2 IMPLANT
BLADE SURG 15 STRL LF DISP TIS (BLADE) IMPLANT
BLADE SURG 15 STRL SS (BLADE)
BUR OVAL 6.0 (BURR) ×2 IMPLANT
CANISTER OMNI JUG 16 LITER (MISCELLANEOUS) ×2 IMPLANT
CANISTER SUCTION 2500CC (MISCELLANEOUS) IMPLANT
CANNULA DRY DOC 8X75 (CANNULA) ×1 IMPLANT
CANNULA TWIST IN 8.25X7CM (CANNULA) ×1 IMPLANT
CLOTH BEACON ORANGE TIMEOUT ST (SAFETY) ×2 IMPLANT
DECANTER SPIKE VIAL GLASS SM (MISCELLANEOUS) IMPLANT
DRAPE STERI 35X30 U-POUCH (DRAPES) ×2 IMPLANT
DRAPE U-SHAPE 47X51 STRL (DRAPES) ×2 IMPLANT
DRAPE U-SHAPE 76X120 STRL (DRAPES) ×4 IMPLANT
DRSG PAD ABDOMINAL 8X10 ST (GAUZE/BANDAGES/DRESSINGS) ×2 IMPLANT
DURAPREP 26ML APPLICATOR (WOUND CARE) ×2 IMPLANT
ELECT MENISCUS 165MM 90D (ELECTRODE) ×2 IMPLANT
ELECT NDL TIP 2.8 STRL (NEEDLE) IMPLANT
ELECT NEEDLE TIP 2.8 STRL (NEEDLE) IMPLANT
ELECT REM PT RETURN 9FT ADLT (ELECTROSURGICAL) ×2
ELECTRODE REM PT RTRN 9FT ADLT (ELECTROSURGICAL) ×1 IMPLANT
GAUZE XEROFORM 1X8 LF (GAUZE/BANDAGES/DRESSINGS) ×2 IMPLANT
GLOVE BIO SURGEON STRL SZ8 (GLOVE) ×2 IMPLANT
GLOVE BIOGEL M STRL SZ7.5 (GLOVE) ×1 IMPLANT
GLOVE BIOGEL PI IND STRL 8 (GLOVE) IMPLANT
GLOVE BIOGEL PI IND STRL 8.5 (GLOVE) ×1 IMPLANT
GLOVE BIOGEL PI INDICATOR 8 (GLOVE) ×2
GLOVE BIOGEL PI INDICATOR 8.5 (GLOVE) ×1
GLOVE ORTHO TXT STRL SZ7.5 (GLOVE) ×2 IMPLANT
GOWN PREVENTION PLUS XLARGE (GOWN DISPOSABLE) ×3 IMPLANT
GOWN STRL REIN 2XL XLG LVL4 (GOWN DISPOSABLE) ×3 IMPLANT
IV NS IRRIG 3000ML ARTHROMATIC (IV SOLUTION) ×8 IMPLANT
NDL SCORPION MULTI FIRE (NEEDLE) IMPLANT
NDL SUT 6 .5 CRC .975X.05 MAYO (NEEDLE) IMPLANT
NEEDLE MAYO TAPER (NEEDLE)
NEEDLE SCORPION MULTI FIRE (NEEDLE) ×2 IMPLANT
NS IRRIG 1000ML POUR BTL (IV SOLUTION) IMPLANT
PACK ARTHROSCOPY DSU (CUSTOM PROCEDURE TRAY) ×2 IMPLANT
PACK BASIN DAY SURGERY FS (CUSTOM PROCEDURE TRAY) ×2 IMPLANT
PASSER SUT SWANSON 36MM LOOP (INSTRUMENTS) IMPLANT
PENCIL BUTTON HOLSTER BLD 10FT (ELECTRODE) ×2 IMPLANT
SET ARTHROSCOPY TUBING (MISCELLANEOUS) ×2
SET ARTHROSCOPY TUBING LN (MISCELLANEOUS) ×1 IMPLANT
SLEEVE SCD COMPRESS KNEE MED (MISCELLANEOUS) IMPLANT
SLING ARM FOAM STRAP LRG (SOFTGOODS) IMPLANT
SLING ARM FOAM STRAP MED (SOFTGOODS) IMPLANT
SLING ARM FOAM STRAP XLG (SOFTGOODS) IMPLANT
SLING ARM IMMOBILIZER LRG (SOFTGOODS) IMPLANT
SLING ARM IMMOBILIZER MED (SOFTGOODS) IMPLANT
SPONGE GAUZE 4X4 12PLY (GAUZE/BANDAGES/DRESSINGS) ×4 IMPLANT
SPONGE LAP 4X18 X RAY DECT (DISPOSABLE) IMPLANT
STRIP CLOSURE SKIN 1/2X4 (GAUZE/BANDAGES/DRESSINGS) IMPLANT
SUCTION FRAZIER TIP 10 FR DISP (SUCTIONS) IMPLANT
SUT ETHIBOND 2 OS 4 DA (SUTURE) IMPLANT
SUT ETHILON 2 0 FS 18 (SUTURE) IMPLANT
SUT ETHILON 3 0 PS 1 (SUTURE) IMPLANT
SUT FIBERWIRE #2 38 T-5 BLUE (SUTURE)
SUT RETRIEVER MED (INSTRUMENTS) IMPLANT
SUT TIGER TAPE 7 IN WHITE (SUTURE) ×2 IMPLANT
SUT VIC AB 0 CT1 27 (SUTURE)
SUT VIC AB 0 CT1 27XBRD ANBCTR (SUTURE) IMPLANT
SUT VIC AB 2-0 SH 27 (SUTURE)
SUT VIC AB 2-0 SH 27XBRD (SUTURE) IMPLANT
SUT VIC AB 3-0 FS2 27 (SUTURE) IMPLANT
SUTURE FIBERWR #2 38 T-5 BLUE (SUTURE) IMPLANT
TAPE FIBER 2MM 7IN #2 BLUE (SUTURE) ×2 IMPLANT
TOWEL OR 17X24 6PK STRL BLUE (TOWEL DISPOSABLE) ×2 IMPLANT
WATER STERILE IRR 1000ML POUR (IV SOLUTION) ×2 IMPLANT
YANKAUER SUCT BULB TIP NO VENT (SUCTIONS) IMPLANT

## 2013-08-09 NOTE — Progress Notes (Signed)
Assisted Dr. Frederick with right, ultrasound guided, interscalene  block. Side rails up, monitors on throughout procedure. See vital signs in flow sheet. Tolerated Procedure well. 

## 2013-08-09 NOTE — Anesthesia Postprocedure Evaluation (Signed)
Anesthesia Post Note  Patient: [name]  Procedure(s) Performed: Procedure(s) (LRB): RIGHT SHOULDER ARTHROSCOPY WITH SUBACROMIAL DECOMPRESSION, DISTAL CLAVICLE EXCISION AND ROTATOR CUFF REPAIR and release biceps (Right)  Anesthesia type: General  Patient location: PACU  Post pain: Pain level controlled  Post assessment: Patient's Cardiovascular Status Stable  Last Vitals:  Filed Vitals:   08/09/13 1045  BP: 134/84  Pulse: 62  Temp:   Resp: 15    Post vital signs: Reviewed and stable  Level of consciousness: alert  Complications: No apparent anesthesia complications

## 2013-08-09 NOTE — Transfer of Care (Signed)
Immediate Anesthesia Transfer of Care Note  Patient: [name]  Procedure(s) Performed: Procedure(s): RIGHT SHOULDER ARTHROSCOPY WITH SUBACROMIAL DECOMPRESSION, DISTAL CLAVICLE EXCISION AND ROTATOR CUFF REPAIR and release biceps (Right)  Patient Location: PACU  Anesthesia Type:General and GA combined with regional for post-op pain  Level of Consciousness: awake and alert   Airway & Oxygen Therapy: Patient Spontanous Breathing and Patient connected to face mask oxygen  Post-op Assessment: Report given to PACU RN and Post -op Vital signs reviewed and stable  Post vital signs: Reviewed and stable  Complications: No apparent anesthesia complications

## 2013-08-09 NOTE — Anesthesia Procedure Notes (Addendum)
Anesthesia Regional Block:  Interscalene brachial plexus block  Pre-Anesthetic Checklist: ,, timeout performed, Correct Patient, Correct Site, Correct Laterality, Correct Procedure, Correct Position, site marked, Risks and benefits discussed,  Surgical consent,  Pre-op evaluation,  At surgeon's request and post-op pain management  Laterality: Right  Prep: chloraprep       Needles:   Needle Type: Other     Needle Length: 9cm  Needle Gauge: 21 and 21 G    Additional Needles:  Procedures: ultrasound guided (picture in chart) Interscalene brachial plexus block Narrative:  Start time: 08/09/2013 7:03 AM End time: 08/09/2013 7:13 AM Injection made incrementally with aspirations every 5 mL.  Performed by: Personally  Anesthesiologist: Aldona Lento, MD  Additional Notes: Ultrasound guidance used to: id relevant anatomy, confirm needle position, local anesthetic spread, avoidance of vascular puncture. Picture saved. No complications. Block performed personally by Janetta Hora. Gelene Mink, MD    Interscalene brachial plexus block Procedure Name: Intubation Date/Time: 08/09/2013 7:43 AM Performed by: Caren Macadam Pre-anesthesia Checklist: Patient identified, Emergency Drugs available, Suction available and Patient being monitored Patient Re-evaluated:Patient Re-evaluated prior to inductionOxygen Delivery Method: Circle System Utilized Preoxygenation: Pre-oxygenation with 100% oxygen Intubation Type: IV induction Ventilation: Mask ventilation without difficulty Laryngoscope Size: Miller and 2 Grade View: Grade I Tube type: Oral Number of attempts: 1 Airway Equipment and Method: stylet and oral airway Placement Confirmation: ETT inserted through vocal cords under direct vision,  positive ETCO2 and breath sounds checked- equal and bilateral Secured at: 21 cm Tube secured with: Tape Dental Injury: Teeth and Oropharynx as per pre-operative assessment

## 2013-08-09 NOTE — Interval H&P Note (Signed)
History and Physical Interval Note:  08/09/2013 7:29 AM  [name]  has presented today for surgery, with the diagnosis of RIGHT SHOULDER IMPINGEMENT SYNDROME, COMPLETE RUPTURE OF ROTATOR CUFF, DEGENERATIVE ARHTRITIS  The various methods of treatment have been discussed with the patient and family. After consideration of risks, benefits and other options for treatment, the patient has consented to  Procedure(s): RIGHT SHOULDER ARTHROSCOPY WITH SUBACROMIAL DECOMPRESSION, DISTAL CLAVICLE EXCISION AND ROTATOR CUFF REPAIR (Right) as a surgical intervention .  The patient's history has been reviewed, patient examined, no change in status, stable for surgery.  I have reviewed the patient's chart and labs.  Questions were answered to the patient's satisfaction.     [name] F

## 2013-08-09 NOTE — Anesthesia Preprocedure Evaluation (Addendum)
Anesthesia Evaluation  Patient identified by MRN, date of birth, ID band Patient awake    Reviewed: Allergy & Precautions, H&P , NPO status , Patient's Chart, lab work & pertinent test results, reviewed documented beta blocker date and time   Airway Mallampati: II TM Distance: >3 FB Neck ROM: full    Dental   Pulmonary asthma ,  breath sounds clear to auscultation        Cardiovascular hypertension, On Medications + Peripheral Vascular Disease Rhythm:regular     Neuro/Psych PSYCHIATRIC DISORDERS TIA Neuromuscular disease    GI/Hepatic Neg liver ROS, GERD-  Medicated and Controlled,  Endo/Other  diabetes, Oral Hypoglycemic Agents  Renal/GU Renal disease  negative genitourinary   Musculoskeletal   Abdominal   Peds  Hematology  (+) anemia ,   Anesthesia Other Findings See surgeon's H&P   Reproductive/Obstetrics negative OB ROS                          Anesthesia Physical Anesthesia Plan  ASA: III  Anesthesia Plan: General   Post-op Pain Management:    Induction: Intravenous  Airway Management Planned: Oral ETT  Additional Equipment:   Intra-op Plan:   Post-operative Plan: Extubation in OR  Informed Consent: I have reviewed the patients History and Physical, chart, labs and discussed the procedure including the risks, benefits and alternatives for the proposed anesthesia with the patient or authorized representative who has indicated his/her understanding and acceptance.   Dental Advisory Given  Plan Discussed with: CRNA and Surgeon  Anesthesia Plan Comments:         Anesthesia Quick Evaluation

## 2013-08-10 ENCOUNTER — Encounter (HOSPITAL_BASED_OUTPATIENT_CLINIC_OR_DEPARTMENT_OTHER): Payer: Self-pay | Admitting: Orthopedic Surgery

## 2013-08-10 NOTE — Op Note (Signed)
NAMECHIYO, [name] NO.:  [id number]  MEDICAL RECORD NO.:  [id number]  LOCATION:  A09C                         FACILITY:  [facility]  PHYSICIAN:  Loreta Ave, M.D. DATE OF BIRTH:  1956/05/10  DATE OF PROCEDURE:  08/09/2013 DATE OF DISCHARGE:  08/09/2013                              OPERATIVE REPORT   PREOPERATIVE DIAGNOSES:  Right shoulder impingement rotator cuff tear, marked degenerative joint disease, acromioclavicular joint.  POSTOPERATIVE DIAGNOSES:  Right shoulder impingement rotator cuff tear, marked degenerative joint disease, acromioclavicular joint with circumferential labral tears, extensive tearing intra-articular portion, long head biceps tendon.  Complete tear supraspinatus and top half of the infraspinatus.  PROCEDURE:  Right shoulder exam under anesthesia, arthroscopy. Debridement of labrum.  Debridement, resection, release of long head biceps tendon allowing it to recess into the bicipital groove. Debridement and mobilization of rotator cuff and roughened tuberosity. Bursectomy, acromioplasty, coracoacromial ligament release.  Excision of distal clavicle.  Arthroscopic-assisted rotator cuff repair with FiberWeave suture x3.  Swivel lock anchors x3.  SURGEON:  Loreta Ave, MD  ASSISTANT:  Domingo Cocking, PA, present throughout the entire case, necessary for timely completion of procedure.  ANESTHESIA:  General.  BLOOD LOSS:  Minimal.  SPECIMENS:  None.  CULTURES:  None.  COMPLICATION:  None.  DRESSING:  Soft compressive shoulder immobilizer.  DESCRIPTION OF PROCEDURE:  The patient was brought to operating room, placed on the operating room table in supine position.  After adequate anesthesia has been obtained, shoulder examined.  A little tight with rotation but relatively good motion.  Placed in beach-chair position in a shoulder positioner.  Prepped and draped in usual sterile fashion. Three portals anterior, posterior and  lateral.  Arthroscope introduced, shoulder distended and inspected.  Some mild grade 2 changes glenoid humerus debrided.  Circumferential labral tear was debrided.  Biceps was torn more than 50% with marked degeneration.  The intra-articular portion released, resected and allowed to recess the bicipital groove. Full-thickness tear supraspinatus on top of the infraspinatus debrided and mobilized above and below.  Type 3 acromion.  Acromioplasty to a type 1 acromion releasing CA ligament this was still intact.  Marked grade 4 changes,  periarticular spurs, AC joint.  Periarticular spurs lateral cm of clavicle resected.  Adequacy of decompression confirmed. Cannula placed laterally.  The cuff was captured with 3 horizontal mattress sutures with a Scorpion device.  Anchored down into the roughened tuberosity with 3 swivel lock anchors.  Nice firm watertight closure achieved.  Instruments were removed.  Portals were closed with nylon.  Sterile compressive dressing applied.  Shoulder immobilizer applied.  Anesthesia reversed.  Brought to the recovery room.  Tolerated the surgery well.  No complications.     Loreta Ave, M.D.     DFM/MEDQ  D:  08/09/2013  T:  08/10/2013  Job:  [id number]

## 2013-08-10 NOTE — Op Note (Deleted)
NAME:  [name], [name]                ACCOUNT NO.:  [id number]  MEDICAL RECORD NO.:  [id number]  LOCATION:  A09C                         FACILITY:  [facility]  PHYSICIAN:  Terina Mcelhinny F. Titus Drone, M.D. DATE OF BIRTH:  08/18/1956  DATE OF PROCEDURE:  08/09/2013 DATE OF DISCHARGE:  08/09/2013                              OPERATIVE REPORT   PREOPERATIVE DIAGNOSES:  Right shoulder impingement rotator cuff tear, marked degenerative joint disease, acromioclavicular joint.  POSTOPERATIVE DIAGNOSES:  Right shoulder impingement rotator cuff tear, marked degenerative joint disease, acromioclavicular joint with circumferential labral tears, extensive tearing intra-articular portion, long head biceps tendon.  Complete tear supraspinatus and top half of the infraspinatus.  PROCEDURE:  Right shoulder exam under anesthesia, arthroscopy. Debridement of labrum.  Debridement, resection, release of long head biceps tendon allowing it to recess into the bicipital groove. Debridement and mobilization of rotator cuff and roughened tuberosity. Bursectomy, acromioplasty, coracoacromial ligament release.  Excision of distal clavicle.  Arthroscopic-assisted rotator cuff repair with FiberWeave suture x3.  Swivel lock anchors x3.  SURGEON:  Delia Sitar F. Nicolemarie Wooley, MD  ASSISTANT:  Bradley Hicks, PA, present throughout the entire case, necessary for timely completion of procedure.  ANESTHESIA:  General.  BLOOD LOSS:  Minimal.  SPECIMENS:  None.  CULTURES:  None.  COMPLICATION:  None.  DRESSING:  Soft compressive shoulder immobilizer.  DESCRIPTION OF PROCEDURE:  The patient was brought to operating room, placed on the operating room table in supine position.  After adequate anesthesia has been obtained, shoulder examined.  A little tight with rotation but relatively good motion.  Placed in beach-chair position in a shoulder positioner.  Prepped and draped in usual sterile fashion. Three portals anterior, posterior and  lateral.  Arthroscope introduced, shoulder distended and inspected.  Some mild grade 2 changes glenoid humerus debrided.  Circumferential labral tear was debrided.  Biceps was torn more than 50% with marked degeneration.  The intra-articular portion released, resected and allowed to recess the bicipital groove. Full-thickness tear supraspinatus on top of the infraspinatus debrided and mobilized above and below.  Type 3 acromion.  Acromioplasty to a type 1 acromion releasing CA ligament this was still intact.  Marked grade 4 changes,  periarticular spurs, AC joint.  Periarticular spurs lateral cm of clavicle resected.  Adequacy of decompression confirmed. Cannula placed laterally.  The cuff was captured with 3 horizontal mattress sutures with a Scorpion device.  Anchored down into the roughened tuberosity with 3 swivel lock anchors.  Nice firm watertight closure achieved.  Instruments were removed.  Portals were closed with nylon.  Sterile compressive dressing applied.  Shoulder immobilizer applied.  Anesthesia reversed.  Brought to the recovery room.  Tolerated the surgery well.  No complications.     Riane Rung F. Valen Mascaro, M.D.     DFM/MEDQ  D:  08/09/2013  T:  08/10/2013  Job:  [id number] 

## 2013-09-04 ENCOUNTER — Ambulatory Visit: Payer: Medicare PPO

## 2013-09-17 ENCOUNTER — Encounter: Payer: Self-pay | Admitting: Family Medicine

## 2013-09-17 ENCOUNTER — Ambulatory Visit (INDEPENDENT_AMBULATORY_CARE_PROVIDER_SITE_OTHER): Payer: Medicare PPO | Admitting: Family Medicine

## 2013-09-17 VITALS — BP 126/85 | HR 88 | Temp 97.9°F | Ht 64.0 in | Wt 191.4 lb

## 2013-09-17 DIAGNOSIS — H6123 Impacted cerumen, bilateral: Secondary | ICD-10-CM

## 2013-09-17 DIAGNOSIS — G4700 Insomnia, unspecified: Secondary | ICD-10-CM

## 2013-09-17 DIAGNOSIS — I10 Essential (primary) hypertension: Secondary | ICD-10-CM

## 2013-09-17 DIAGNOSIS — H6120 Impacted cerumen, unspecified ear: Secondary | ICD-10-CM

## 2013-09-17 DIAGNOSIS — J069 Acute upper respiratory infection, unspecified: Secondary | ICD-10-CM

## 2013-09-17 MED ORDER — ATORVASTATIN CALCIUM 40 MG PO TABS: 40.0000 mg | ORAL_TABLET | Freq: Every day | ORAL | Status: DC

## 2013-09-17 MED ORDER — LISINOPRIL 40 MG PO TABS: 40.0000 mg | ORAL_TABLET | Freq: Every day | ORAL | Status: DC

## 2013-09-17 MED ORDER — ISOSORBIDE DINITRATE 20 MG PO TABS: 20.0000 mg | ORAL_TABLET | Freq: Two times a day (BID) | ORAL | Status: DC

## 2013-09-17 MED ORDER — ESOMEPRAZOLE MAGNESIUM 40 MG PO CPDR: 40.0000 mg | DELAYED_RELEASE_CAPSULE | Freq: Every day | ORAL | Status: DC

## 2013-09-17 MED ORDER — FLUTICASONE PROPIONATE 50 MCG/ACT NA SUSP: 2.0000 | Freq: Every day | NASAL | Status: DC

## 2013-09-17 MED ORDER — CALCIUM CARBONATE 600 MG PO TABS: 600.0000 mg | ORAL_TABLET | Freq: Two times a day (BID) | ORAL | Status: DC

## 2013-09-17 MED ORDER — SPIRONOLACTONE 25 MG PO TABS: 25.0000 mg | ORAL_TABLET | Freq: Every day | ORAL | Status: DC

## 2013-09-17 MED ORDER — AMOXICILLIN-POT CLAVULANATE 875-125 MG PO TABS: 1.0000 | ORAL_TABLET | Freq: Two times a day (BID) | ORAL | Status: DC

## 2013-09-17 MED ORDER — CLONIDINE HCL 0.1 MG PO TABS: 0.1000 mg | ORAL_TABLET | Freq: Two times a day (BID) | ORAL | Status: DC

## 2013-09-17 MED ORDER — ZOLPIDEM TARTRATE 5 MG PO TABS: 5.0000 mg | ORAL_TABLET | Freq: Every evening | ORAL | Status: DC | PRN

## 2013-09-17 MED ORDER — URSODIOL 300 MG PO CAPS: 300.0000 mg | ORAL_CAPSULE | Freq: Two times a day (BID) | ORAL | Status: DC

## 2013-09-17 NOTE — Assessment & Plan Note (Addendum)
A: BP excellent with wt loss! Meds: compliant  P: Wean meds starting with clonidine 0.2 mg BID to 0.1 mg BID  F/u in one month then 0.1 mg day x one  Week, then every other day x one week then off.  Plan for weaning meds as tolerated:  Next will d/c aldactone.  Next will d/c isordil  Plan to keep lisinopril given hx of DM2 and CVA.

## 2013-09-17 NOTE — Assessment & Plan Note (Signed)
Removed with irrigation and curette

## 2013-09-17 NOTE — Patient Instructions (Addendum)
It was nice to see you today [name], we are sorry you have not been feeling well.  We think that a viral illness is causing your symptoms.  For now we will treat your symptoms.    -Use a saline spray to help clear out your nose, this can be picked up at a pharmacy.  If it does not help, try the nasal steroid Flonase.   -Continue to use your benadryl at night. -It is safe to use over-the-counter cough medicine. -Make sure to drink plenty of water!  We expect your symptoms to begin to improve in a couple of days.  If they do not improve, or they get worse and you have fevers, please call the clinic.  We will prescribe an antibiotic for use if your symptoms do not get better.   Continue to keep up the good work managing your health!  We have prescribed you the Ambien to help with sleep, and have decreased the Clonidine from 0.2mg  to 0.1mg .  We have refilled your medications and will see you back in clinic to follow up on your management.

## 2013-09-17 NOTE — Assessment & Plan Note (Signed)
A: day 7 of illness. 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  P: 1. Drink plenty of fluids. Hot tea, soup etc will help open your nasal passages. 2. Nasal saline and flonase prn. 3. Continue nightly benadryl. Daily antihistamine prn.    Call for chest pain, shortness of breath, persistent high fever. augmentin sent in for prn no improvement in course, worsening, fever.

## 2013-09-17 NOTE — Progress Notes (Signed)
Patient ID: [name], female   DOB: [birth year], 57 y.o.   MRN: [id number]  Subjective HPI: [name] complains of a 1 week history of upper respiratory symptoms.  She endorses congestion, a runny nose, and some nasal drainage over the past week.  She has taken Benadryl at night and says that it helps.  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  She denies headache, SOB, chest pain, or dizziness.  Past Medical History  Diagnosis Date  . Anemia   . Mini stroke   . GERD (gastroesophageal reflux disease)   . Arthritis     knees  . Hypertension     under control with meds., has been on med. > 20 yr.  . High cholesterol   . Shoulder impingement 08/2013    right  . Rotator cuff rupture, complete 08/2013    right  . Degenerative arthritis of right shoulder region 08/2013  . Wears dentures     upper  . Wears partial dentures     lower  . History of GI bleed    ROS: negative except per HPI  Objective  Filed Vitals:   09/17/13 0902  BP: 126/85  Pulse: 88  Temp: 97.9 F (36.6 C)  TempSrc: Oral  Height: 5\' 4"  (1.626 m)  Weight: 191 lb 6.4 oz (86.818 kg)   Physical Exam: General appearance: alert, cooperative and no distress  HEENT: moist membranes, mild nasal erythema and swelling, normal throat exam, ears normal but full of wax, no sinus tenderness Neck: supple, no LAD Lungs: clear bilaterally Heart: RRR  Assessment [name] is a 57 y.o. who presents with upper respiratory symptoms consistent with a viral URI.  Plan  URI: most consistent with a viral URI at this time -Recommended nasal saline to help with symptoms, recommended Flonase for further help. -Discussed use of OTC cough medication and benadryl to help with symptoms. -Recommended lots of fluids. -Prescribed Augmentin for use if symptoms do not resolve or if they get worse and the patient has a fever.  Health Maintenance: -Refilled current  medications. -BPs look controlled at 126/85 today.  Decreased Clonidine from 0.2mg  to 0.1mg  today.  Plan for a 1 month follow-up to check BPs. -Prescribed Ambien 5mg  to help with sleep -Administered saline wash to clean out cerumen in ears.   Attending Addendum  I examined the patient and discussed the assessment and plan wit Student  Dr. Emeterio Reeve. I have reviewed the note and agree.  BP 126/85  Pulse 88  Temp(Src) 97.9 F (36.6 C) (Oral)  Ht 5\' 4"  (1.626 m)  Wt 191 lb 6.4 oz (86.818 kg)  BMI 32.84 kg/m2 General appearance: alert, cooperative and no distress Head: Normocephalic, without obvious abnormality, atraumatic Eyes: conjunctivae/corneas clear.  Ears: cerumen impaction in both ears. removed with irrigation and currete.  Nose: no discharge, turbinates red, swollen Throat: lips, mucosa, and tongue normal; teeth and gums normal Neck: no adenopathy, no carotid bruit, no JVD, supple, symmetrical, trachea midline and thyroid not enlarged, symmetric, no tenderness/mass/nodules Lungs: clear to auscultation bilaterally Heart: regular rate and rhythm, S1, S2 normal, no murmur, click, rub or gallop Neurologic: Grossly normal    Dessa Phi, MD [facility]

## 2013-09-20 ENCOUNTER — Other Ambulatory Visit: Payer: Self-pay | Admitting: Family Medicine

## 2013-10-19 ENCOUNTER — Ambulatory Visit (INDEPENDENT_AMBULATORY_CARE_PROVIDER_SITE_OTHER): Payer: Medicare PPO | Admitting: Family Medicine

## 2013-10-19 ENCOUNTER — Encounter: Payer: Self-pay | Admitting: Family Medicine

## 2013-10-19 VITALS — BP 122/84 | HR 88 | Temp 98.3°F | Ht 64.0 in | Wt 188.9 lb

## 2013-10-19 DIAGNOSIS — Z23 Encounter for immunization: Secondary | ICD-10-CM

## 2013-10-19 DIAGNOSIS — K5900 Constipation, unspecified: Secondary | ICD-10-CM | POA: Insufficient documentation

## 2013-10-19 DIAGNOSIS — I10 Essential (primary) hypertension: Secondary | ICD-10-CM

## 2013-10-19 HISTORY — DX: Constipation, unspecified: K59.00

## 2013-10-19 MED ORDER — POLYETHYLENE GLYCOL 3350 17 GM/SCOOP PO POWD: 17.0000 g | Freq: Two times a day (BID) | ORAL | Status: DC

## 2013-10-19 MED ORDER — CLONIDINE HCL 0.1 MG PO TABS: 0.1000 mg | ORAL_TABLET | Freq: Every day | ORAL | Status: DC

## 2013-10-19 MED ORDER — DOCUSATE SODIUM 100 MG PO CAPS: 100.0000 mg | ORAL_CAPSULE | Freq: Two times a day (BID) | ORAL | Status: DC | PRN

## 2013-10-19 NOTE — Assessment & Plan Note (Signed)
A: continues to be well controlled with weight loss. P:  Continue with planned taper schedule: Reduce clonidine to once daily start today.  Once every other day starting in 7 days. Last dose 11/01/13.  Patient to monitor BP at home and give me a f/u call.  Next will d/c aldactone. Next will d/c isordil. Plan to keep lisinopril given hx of DM2 and CVA. Will discuss this further with patient when we get to this point.

## 2013-10-19 NOTE — Assessment & Plan Note (Signed)
A: chronic P: refilled colace and miralax.

## 2013-10-19 NOTE — Patient Instructions (Signed)
Thank you for coming in today. Your blood pressure is excellent.  Please decrease clonidine to 0.1 mg daily for one week, then every other day for one week. The stop.  Last dose of clonidine 11/01/12. Call me sooner if needed.   Call me in one month with BP #s and we will stop aldactone at that time if good.   F/u with me in 2 months.

## 2013-10-19 NOTE — Progress Notes (Signed)
   Subjective:    Patient ID: [name], female    DOB: [birth year], 57 y.o.   MRN: [id number]  HPI 57 yo F presents for f/u appt:  1. HTN: compliant with medications. Denies HA, vision changes, CP, SOB and LE edema. No symptoms at rest or with exercise.   2. Constipation: chronic. Worsens if she misses colace or miralax. She denies fever, chills, purposeless weight loss.   3. Health care maintenance: due for flu shot.   Review of Systems As per HPI    Objective:   Physical Exam BP 122/84  Pulse 88  Temp(Src) 98.3 F (36.8 C) (Oral)  Ht 5\' 4"  (1.626 m)  Wt 188 lb 14.4 oz (85.684 kg)  BMI 32.41 kg/m2 BP Readings from Last 3 Encounters:  10/19/13 122/84  09/17/13 126/85  08/09/13 144/79   Wt Readings from Last 3 Encounters:  10/19/13 188 lb 14.4 oz (85.684 kg)  09/17/13 191 lb 6.4 oz (86.818 kg)  08/09/13 192 lb 4 oz (87.204 kg)  General appearance: alert, cooperative and no distress Lungs: clear to auscultation bilaterally Heart: regular rate and rhythm, S1, S2 normal, no murmur, click, rub or gallop Extremities: extremities normal, atraumatic, no cyanosis or edema    Assessment & Plan:

## 2013-11-05 ENCOUNTER — Telehealth: Payer: Self-pay | Admitting: Family Medicine

## 2013-11-05 DIAGNOSIS — M25511 Pain in right shoulder: Secondary | ICD-10-CM

## 2013-11-05 NOTE — Telephone Encounter (Signed)
Faxed. Laquon Emel Dawn  

## 2013-11-05 NOTE — Telephone Encounter (Signed)
I have created the referral and added the details under the Comments section.

## 2013-11-05 NOTE — Telephone Encounter (Signed)
Pt called because her new insurance, needs a referral for to continue with her PT at [facility]. We needs to put from 11/01/13 so that it will be covered. Can we call her when it is ready for pickup. jw

## 2013-11-05 NOTE — Telephone Encounter (Signed)
Please let me know what I need to do for this.  Permission for verbal order for PT is okay.

## 2013-11-05 NOTE — Telephone Encounter (Signed)
I spoke with Raliegh Ip which transferred me to their PT division.  They will need a written referral faxed over to them @ [phone].  As of 10/23/13 her progress note was requesting PT 2 times a week for 4 weeks (8 visits).  PT request that we fax over referral for at least 8 more visits. This is a new standard with [employer].  Will forward to Dr. Sandra Cockayne, Salome Spotted

## 2013-11-19 ENCOUNTER — Ambulatory Visit: Payer: Medicare PPO | Admitting: Family Medicine

## 2013-11-23 ENCOUNTER — Telehealth: Payer: Self-pay | Admitting: Family Medicine

## 2013-11-23 ENCOUNTER — Ambulatory Visit (INDEPENDENT_AMBULATORY_CARE_PROVIDER_SITE_OTHER): Payer: Medicare PPO | Admitting: Family Medicine

## 2013-11-23 ENCOUNTER — Encounter: Payer: Self-pay | Admitting: Family Medicine

## 2013-11-23 VITALS — BP 130/90 | HR 93 | Temp 98.0°F | Ht 64.0 in | Wt 185.1 lb

## 2013-11-23 DIAGNOSIS — I10 Essential (primary) hypertension: Secondary | ICD-10-CM

## 2013-11-23 DIAGNOSIS — I639 Cerebral infarction, unspecified: Secondary | ICD-10-CM | POA: Insufficient documentation

## 2013-11-23 DIAGNOSIS — Z9884 Bariatric surgery status: Secondary | ICD-10-CM

## 2013-11-23 DIAGNOSIS — Z8679 Personal history of other diseases of the circulatory system: Secondary | ICD-10-CM

## 2013-11-23 DIAGNOSIS — E119 Type 2 diabetes mellitus without complications: Secondary | ICD-10-CM

## 2013-11-23 LAB — POCT GLYCOSYLATED HEMOGLOBIN (HGB A1C): Hemoglobin A1C: 5.3

## 2013-11-23 MED ORDER — LISINOPRIL-HYDROCHLOROTHIAZIDE 20-12.5 MG PO TABS: 2.0000 | ORAL_TABLET | Freq: Every day | ORAL | Status: DC

## 2013-11-23 NOTE — Telephone Encounter (Signed)
Called patient's pharmacy to give updated medication changes. Call completed.

## 2013-11-23 NOTE — Progress Notes (Signed)
   Subjective:    Patient ID: [name], female    DOB: 04-10-1956, 58 y.o.   MRN: [id number]  HPI 58 yo F s/p gastric bypass with HTN, h/o DM2 and CVA presents for f/u visit:  1. HTN: tapered off clonidine as instructed. Denies HA, CP, SOB. Checking BPs. High 185/91 last night at home after a busy day. Usually 108-130/80-90. Continues to exercise and lose weight. Planning skin removal surgery in the next few months.  2. DM2: still checking CBGs. Normal A1cs since losing weight in anticipate of bypass. Admits to hypoglycemia with CBGs in 60-80s frequently over the past two weeks. Admits to not eating prior to workouts.   Soc Hx: non-smoker  Review of Systems As per HPI    Objective:   Physical Exam BP 127/92  Pulse 93  Temp(Src) 98 F (36.7 C) (Oral)  Ht 5\' 4"  (1.626 m)  Wt 185 lb 1.6 oz (83.961 kg)  BMI 31.76 kg/m2 Wt Readings from Last 3 Encounters:  11/23/13 185 lb 1.6 oz (83.961 kg)  10/19/13 188 lb 14.4 oz (85.684 kg)  09/17/13 191 lb 6.4 oz (86.818 kg)  General appearance: alert, cooperative and no distress Lungs: clear to auscultation bilaterally Heart: regular rate and rhythm, S1, S2 normal, no murmur, click, rub or gallop Extremities: extremities normal, atraumatic, no cyanosis or edema  Lab Results  Component Value Date   HGBA1C 5.3 11/23/2013       Assessment & Plan:

## 2013-11-23 NOTE — Assessment & Plan Note (Addendum)
A: recent hypoglycemic events. Lab Results  Component Value Date   HGBA1C 5.3 11/23/2013  P: Be sure to eat a snack/small meal before exercise. Aim for 200-300 calories. Adding HCTZ to lisinopril to help keep blood sugar up.

## 2013-11-23 NOTE — Patient Instructions (Signed)
[name],  Thank you for coming in today. Your BP is doing well. I am sorry to hear about low blood sugars.  For sugars: Be sure to eat a snack/small meal before exercise. Aim for 200-300 calories. Adding HCTZ to lisinopril to help keep blood sugar up.   For HTN: Stop spironolactone Added HCTZ to lisinopril take 2 tabs once daily in the morning.   F/u in 6 weeks.  Dr. Adrian Blackwater

## 2013-11-23 NOTE — Assessment & Plan Note (Addendum)
A: continues to be well controlled with weight loss. Patient stopped clonidine as instructed. Hypoglycemic events. P:  Continue with planned taper schedule: D/C  aldactone today. Add HCTZ (raise CBGs, diuretic) to lisinopril today. Next will d/c isordil. Plan to keep lisinopril given hx of DM2 and CVA. Will discuss this further with patient when we get to this point.

## 2013-12-03 ENCOUNTER — Other Ambulatory Visit: Payer: Self-pay | Admitting: Family Medicine

## 2013-12-03 DIAGNOSIS — I10 Essential (primary) hypertension: Secondary | ICD-10-CM

## 2013-12-03 NOTE — Telephone Encounter (Signed)
Called patient BP ranges 98-99/ She was accidentally doubling her lisinopril dose by taking lisinopril 40 mg and Zestoretic two pills once daily = lisinopril 80 mg daily.   She took her last dose of isordil today. Plan: d/c isordil  Continue Zestoretic two pills once daily only for BP control.   Called patient's pharmacy to relay med change.   

## 2013-12-03 NOTE — Assessment & Plan Note (Signed)
Called patient BP ranges 98-99/ She was accidentally doubling her lisinopril dose by taking lisinopril 40 mg and Zestoretic two pills once daily = lisinopril 80 mg daily.   She took her last dose of isordil today. Plan: d/c isordil  Continue Zestoretic two pills once daily only for BP control.   Called patient's pharmacy to relay med change.

## 2014-01-07 ENCOUNTER — Encounter: Payer: Self-pay | Admitting: Family Medicine

## 2014-01-07 ENCOUNTER — Ambulatory Visit (INDEPENDENT_AMBULATORY_CARE_PROVIDER_SITE_OTHER): Payer: Commercial Managed Care - HMO | Admitting: Family Medicine

## 2014-01-07 VITALS — BP 136/80 | HR 71 | Temp 97.8°F | Wt 185.0 lb

## 2014-01-07 DIAGNOSIS — Z8639 Personal history of other endocrine, nutritional and metabolic disease: Secondary | ICD-10-CM

## 2014-01-07 DIAGNOSIS — Z9884 Bariatric surgery status: Secondary | ICD-10-CM

## 2014-01-07 DIAGNOSIS — I10 Essential (primary) hypertension: Secondary | ICD-10-CM

## 2014-01-07 DIAGNOSIS — Z862 Personal history of diseases of the blood and blood-forming organs and certain disorders involving the immune mechanism: Secondary | ICD-10-CM

## 2014-01-07 LAB — CBC
HCT: 37.6 % (ref 36.0–46.0)
Hemoglobin: 12.7 g/dL (ref 12.0–15.0)
MCH: 30.7 pg (ref 26.0–34.0)
MCHC: 33.8 g/dL (ref 30.0–36.0)
MCV: 90.8 fL (ref 78.0–100.0)
Platelets: 257 10*3/uL (ref 150–400)
RBC: 4.14 MIL/uL (ref 3.87–5.11)
RDW: 14.1 % (ref 11.5–15.5)
WBC: 5.5 10*3/uL (ref 4.0–10.5)

## 2014-01-07 LAB — LIPID PANEL
Cholesterol: 117 mg/dL (ref 0–200)
HDL: 63 mg/dL (ref >39)
LDL Cholesterol: 46 mg/dL (ref 0–99)
Total CHOL/HDL Ratio: 1.9 Ratio
Triglycerides: 38 mg/dL (ref <150)
VLDL: 8 mg/dL (ref 0–40)

## 2014-01-07 LAB — COMPREHENSIVE METABOLIC PANEL
ALT: 15 U/L (ref 0–35)
AST: 24 U/L (ref 0–37)
Albumin: 4 g/dL (ref 3.5–5.2)
Alkaline Phosphatase: 81 U/L (ref 39–117)
BUN: 17 mg/dL (ref 6–23)
CO2: 30 mEq/L (ref 19–32)
Calcium: 9.2 mg/dL (ref 8.4–10.5)
Chloride: 106 mEq/L (ref 96–112)
Creat: 0.71 mg/dL (ref 0.50–1.10)
Glucose, Bld: 79 mg/dL (ref 70–99)
Potassium: 3.8 mEq/L (ref 3.5–5.3)
Sodium: 142 mEq/L (ref 135–145)
Total Bilirubin: 0.5 mg/dL (ref 0.2–1.2)
Total Protein: 6.7 g/dL (ref 6.0–8.3)

## 2014-01-07 MED ORDER — FERROUS SULFATE 220 (44 FE) MG/5ML PO ELIX: ORAL_SOLUTION | ORAL | Status: DC

## 2014-01-07 MED ORDER — ESOMEPRAZOLE MAGNESIUM 40 MG PO CPDR: 40.0000 mg | DELAYED_RELEASE_CAPSULE | Freq: Every day | ORAL | Status: DC

## 2014-01-07 MED ORDER — FEXOFENADINE HCL 180 MG PO TABS: 180.0000 mg | ORAL_TABLET | Freq: Every day | ORAL | Status: DC

## 2014-01-07 MED ORDER — ATORVASTATIN CALCIUM 40 MG PO TABS: 40.0000 mg | ORAL_TABLET | Freq: Every day | ORAL | Status: DC

## 2014-01-07 MED ORDER — ZOLPIDEM TARTRATE 10 MG PO TABS: 10.0000 mg | ORAL_TABLET | Freq: Every evening | ORAL | Status: DC | PRN

## 2014-01-07 MED ORDER — ONE-DAILY MULTI VITAMINS PO TABS: 1.0000 | ORAL_TABLET | Freq: Every day | ORAL | Status: AC

## 2014-01-07 MED ORDER — LISINOPRIL-HYDROCHLOROTHIAZIDE 20-12.5 MG PO TABS: 2.0000 | ORAL_TABLET | Freq: Every day | ORAL | Status: DC

## 2014-01-07 MED ORDER — URSODIOL 300 MG PO CAPS: 300.0000 mg | ORAL_CAPSULE | Freq: Two times a day (BID) | ORAL | Status: DC

## 2014-01-07 MED ORDER — FLUTICASONE PROPIONATE 50 MCG/ACT NA SUSP: 2.0000 | Freq: Every day | NASAL | Status: DC

## 2014-01-07 MED ORDER — DOCUSATE SODIUM 100 MG PO CAPS: 100.0000 mg | ORAL_CAPSULE | Freq: Two times a day (BID) | ORAL | Status: DC | PRN

## 2014-01-07 MED ORDER — POLYETHYLENE GLYCOL 3350 17 GM/SCOOP PO POWD: 17.0000 g | Freq: Two times a day (BID) | ORAL | Status: DC

## 2014-01-07 MED ORDER — CALCIUM CARBONATE 600 MG PO TABS: 600.0000 mg | ORAL_TABLET | Freq: Two times a day (BID) | ORAL | Status: DC

## 2014-01-07 NOTE — Assessment & Plan Note (Signed)
A: normal A1c. Planned every 2 yr eye exams. P: ophthalmology referral

## 2014-01-07 NOTE — Progress Notes (Signed)
   Subjective:    Patient ID: [name], female    DOB: [birth year], 58 y.o.   MRN: [id number]  HPI CHRONIC HYPERTENSION  Disease Monitoring  Blood pressure range: 130-140/80-90  Chest pain: no   Dyspnea: no   Claudication: no   Medication compliance: yes  Medication Side Effects  Lightheadedness: no   Urinary frequency: no   Edema: yes, trace   Impotence: no   Preventitive Healthcare:  Exercise: yes daily   Diet Pattern: low carb, high fiber   Salt Restriction: yes   2. H/o DM2: diet controlled. Normalized with weight loss as well. Does admit to increased thirst at night.   3. Post bariatric surgery: planning skin removal surgery for abdomen next month. Requesting pre-op labs.   Soc Hx: non smoker  Review of Systems As per HPI     Objective:   Physical Exam BP 136/80  Pulse 71  Temp(Src) 97.8 F (36.6 C) (Oral)  Wt 185 lb (83.915 kg) Wt Readings from Last 3 Encounters:  01/07/14 185 lb (83.915 kg)  11/23/13 185 lb 1.6 oz (83.961 kg)  10/19/13 188 lb 14.4 oz (85.684 kg)  General appearance: alert, cooperative and no distress Lungs: clear to auscultation bilaterally Heart: regular rate and rhythm, S1, S2 normal, no murmur, click, rub or gallop Extremities: edema trace b/l LE        Assessment & Plan:

## 2014-01-07 NOTE — Patient Instructions (Signed)
Zarai,  Thank you for coming in today.  I will be in touch with results.  Be sure to drink plenty of water throughout the day to help with nighttime cramping.   I have placed referral to get you back in to see Dr. Camillo Flaming.   Dr. Adrian Blackwater

## 2014-01-07 NOTE — Assessment & Plan Note (Signed)
A: at goal. Meds: compliant P: continue current regimen

## 2014-01-07 NOTE — Assessment & Plan Note (Signed)
A: doing well. Planning skin removal surgery P: Pre-op labs ordered.

## 2014-01-08 ENCOUNTER — Encounter: Payer: Self-pay | Admitting: Family Medicine

## 2014-01-08 ENCOUNTER — Ambulatory Visit (INDEPENDENT_AMBULATORY_CARE_PROVIDER_SITE_OTHER): Payer: Commercial Managed Care - HMO | Admitting: Home Health Services

## 2014-01-08 DIAGNOSIS — E119 Type 2 diabetes mellitus without complications: Secondary | ICD-10-CM

## 2014-01-08 NOTE — Progress Notes (Signed)
DIABETES Pt came in to have a retinal scan per diabetic care.   Image was taken and submitted to [facility]-DR. Cathren Laine for reading.    Results will be available in 1-2 weeks.  Results will be given to PCP for review and to contact patient.  Vinnie Level

## 2014-01-11 ENCOUNTER — Telehealth: Payer: Self-pay | Admitting: *Deleted

## 2014-01-11 DIAGNOSIS — M79672 Pain in left foot: Principal | ICD-10-CM

## 2014-01-11 DIAGNOSIS — M79671 Pain in right foot: Secondary | ICD-10-CM | POA: Insufficient documentation

## 2014-01-11 NOTE — Telephone Encounter (Signed)
Please inform patient.  Referral to podiatry placed. Also patient had a normal retinal scan.

## 2014-01-11 NOTE — Assessment & Plan Note (Signed)
A: foot pain with corns. P: amb referral to podiatry.

## 2014-01-11 NOTE — Telephone Encounter (Signed)
Pt states that she has very painful corns on both feet.  She has tried multiple OTC medications with no relief.  She would like to know if you can send her to a podiatrist.  Please advise.  She has never seen one before. Rosabell Geyer,CMA

## 2014-01-14 ENCOUNTER — Ambulatory Visit: Payer: Commercial Managed Care - HMO

## 2014-01-14 NOTE — Telephone Encounter (Signed)
Pt is aware of this.  Informed her that referral coordinator will call her to schedule this appt. [name],CMA

## 2014-02-06 ENCOUNTER — Encounter: Payer: Self-pay | Admitting: Podiatry

## 2014-02-06 ENCOUNTER — Ambulatory Visit (INDEPENDENT_AMBULATORY_CARE_PROVIDER_SITE_OTHER): Payer: Commercial Managed Care - HMO

## 2014-02-06 ENCOUNTER — Telehealth: Payer: Self-pay | Admitting: Family Medicine

## 2014-02-06 ENCOUNTER — Ambulatory Visit (INDEPENDENT_AMBULATORY_CARE_PROVIDER_SITE_OTHER): Payer: Commercial Managed Care - HMO | Admitting: Podiatry

## 2014-02-06 VITALS — BP 129/83 | HR 81 | Resp 15 | Ht 64.0 in | Wt 182.0 lb

## 2014-02-06 DIAGNOSIS — M2040 Other hammer toe(s) (acquired), unspecified foot: Secondary | ICD-10-CM

## 2014-02-06 DIAGNOSIS — M79609 Pain in unspecified limb: Secondary | ICD-10-CM

## 2014-02-06 DIAGNOSIS — Z8639 Personal history of other endocrine, nutritional and metabolic disease: Secondary | ICD-10-CM

## 2014-02-06 NOTE — Telephone Encounter (Signed)
Referral placed. Please inform patient.

## 2014-02-06 NOTE — Telephone Encounter (Signed)
Pt called and needs a referral for the [facility] to have her toe nail cut. jw

## 2014-02-06 NOTE — Progress Notes (Signed)
   Subjective:    Patient ID: [name], female    DOB: 08/24/1956, 58 y.o.   MRN: [id number]  HPI N painful toes                           L B/L 5th toes lateral                           D and O 2 weeks                           C painful hard core                            A enclosed shoes or pressure                           T medicated corn pads      Review of Systems  All other systems reviewed and are negative.  Surgical history reveals gastric bypass with 150 pound weight reduction resulting in resolving diabetes and reducing symptoms hypertension. Right foot surgery    Objective:   Physical Exam  Orientated x44 58 year old black female  Vascular: DP and PT pulses 2/4 bilaterally  Neurological: Sensation to 10 g monofilament wire intact 5/5 bilaterally. Vibratory sensation intact bilaterally. Knee and ankle reflexes equal and reactive bilaterally.  Dermatological: Well-healed surgical scars noted over the right first MPJ and dorsal first metatarsal interspace area. Keratoses noted on the fifth toes bilaterally  Musculoskeletal: Bilateral HAV deformities noted Adductovarus 3, 4, 5 bilaterally  X-ray report weightbearing left foot   Intact bony structure without a fracture and/or dislocation noted. HAV deformity noted. Adductovarus 3, 4, 5 toes noted. Posterior and inferior calcaneal spurs noted.  Radiographic impression:  No acute bony abnormality noted  HAV deformity  Hammertoe deformities 3, 4, 5  Posterior and inferior calcaneal spurs  X-ray report right foot weightbearing   Intact bony structure without fracture and/or dislocation noted. Evidence of surgical bone removal medial condyle had of first metatarsal noted. Residual HAV deformity noted. Posterior and inferior heel spurs noted. Adductovarus 3, 4, 5 toes noted.  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 If symptoms persist over time surgical treatment could be an option.  Information about hammertoes provided at discharge

## 2014-02-06 NOTE — Telephone Encounter (Signed)
Will forward to Dr. Adrian Blackwater, but also to Westley Gambles RN.  Unless PCP has changed recently, pt is assigned to [facility]. Laban Emperor Klayton Monie

## 2014-02-06 NOTE — Patient Instructions (Signed)
Hammer Toes Hammer toes is a condition in which one or more of your toes is permanently flexed. CAUSES  This happens when a muscle imbalance or abnormal bone length makes your small toes buckle. This causes the toe joint to contract and the strong cord-like bands that attach muscles to the bones (tendons) in your toes to shorten.  SIGNS AND SYMPTOMS  Common symptoms of flexible hammer toes include:   A build up of skin cells (Corns). Corns occur where boney bumps come in frequent contact with hard surfaces. For example, where your shoes press and rub.  Irritation.  Inflammation.  Pain.  Limited motion in your toes DIAGNOSIS  Hammer toes are diagnosed through a physical exam of your toes.During the exam, your health care provider may try to reproduce your symptoms by manipulating your foot. Often, x-ray exams are done to determine the degree of deformity and to make sure that the cause is not a fracture.  TREATMENT  Hammer toes can be treated with corrective surgery. There are several types of surgical procedures that can treat hammer toes. The most common procedures include:  Arthroplasty A portion of the joint is surgically removed and your toe is straightened. The gap fills in with fibrous tissue. This procedure helps treat pain and deformity and helps restore function.  Fusion Cartilage between the two bones of the affected joint is taken out and the bones fuse together into one longer bone. This helps keep your toe stable and reduces pain but leaves your toe stiff, yet straight.  Implantation A portion of your bone is removed and replaced with an implant to restore motion.  Flexor tendon transfers This procedure repositions the tendons that curl the toes down (flexor tendons). This may be done to release the deforming force that causes your toe to buckle. Several of these procedures require fixing your toe with a pin that is visible at the tip of your toe. The pin keeps the toe  straight during healing. Your health care provider will remove the pin usually within 4 8 weeks after the procedure.  Document Released: 10/15/2000 Document Revised: 08/08/2013 Document Reviewed: 06/25/2013 [employer] Patient Information 2014 [employer].  

## 2014-02-07 ENCOUNTER — Encounter: Payer: Self-pay | Admitting: Podiatry

## 2014-02-12 NOTE — Telephone Encounter (Signed)
Unsure why PCP was changed in computer, possibly at hospital.  Changed PCP in Epic back to Dr. Adrian Blackwater.  Burna Forts, BSN, RN-BC

## 2014-02-13 ENCOUNTER — Telehealth: Payer: Self-pay | Admitting: Family Medicine

## 2014-02-13 DIAGNOSIS — Z8673 Personal history of transient ischemic attack (TIA), and cerebral infarction without residual deficits: Secondary | ICD-10-CM | POA: Insufficient documentation

## 2014-02-13 DIAGNOSIS — E785 Hyperlipidemia, unspecified: Secondary | ICD-10-CM | POA: Insufficient documentation

## 2014-02-13 DIAGNOSIS — E1169 Type 2 diabetes mellitus with other specified complication: Secondary | ICD-10-CM

## 2014-02-13 HISTORY — DX: Type 2 diabetes mellitus with other specified complication: E11.69

## 2014-02-13 HISTORY — DX: Personal history of transient ischemic attack (TIA), and cerebral infarction without residual deficits: Z86.73

## 2014-02-13 NOTE — Telephone Encounter (Signed)
Pt called because the allergy medication that she is taking is not working and she was wondering if the doctor can call something else in. [place]

## 2014-02-14 MED ORDER — LORATADINE 10 MG PO TABS: 10.0000 mg | ORAL_TABLET | Freq: Every day | ORAL | Status: DC

## 2014-02-14 NOTE — Telephone Encounter (Signed)
Please inform patient.  D/Cd allegra. Sent in claritin.

## 2014-02-20 ENCOUNTER — Ambulatory Visit (INDEPENDENT_AMBULATORY_CARE_PROVIDER_SITE_OTHER): Payer: Commercial Managed Care - HMO | Admitting: Podiatry

## 2014-02-20 VITALS — BP 135/80 | HR 78 | Resp 12

## 2014-02-20 DIAGNOSIS — M2040 Other hammer toe(s) (acquired), unspecified foot: Secondary | ICD-10-CM | POA: Diagnosis not present

## 2014-02-20 NOTE — Patient Instructions (Signed)
If the fifth right toe continues to be painful in the next 4-6 weeks schedule appointment with Dr. Blenda Mounts for possible surgical treatment.  Hammer Toes Hammer toes is a condition in which one or more of your toes is permanently flexed. CAUSES  This happens when a muscle imbalance or abnormal bone length makes your small toes buckle. This causes the toe joint to contract and the strong cord-like bands that attach muscles to the bones (tendons) in your toes to shorten.  SIGNS AND SYMPTOMS  Common symptoms of flexible hammer toes include:   A build up of skin cells (Corns). Corns occur where boney bumps come in frequent contact with hard surfaces. For example, where your shoes press and rub.  Irritation.  Inflammation.  Pain.  Limited motion in your toes DIAGNOSIS  Hammer toes are diagnosed through a physical exam of your toes.During the exam, your health care provider may try to reproduce your symptoms by manipulating your foot. Often, x-ray exams are done to determine the degree of deformity and to make sure that the cause is not a fracture.  TREATMENT  Hammer toes can be treated with corrective surgery. There are several types of surgical procedures that can treat hammer toes. The most common procedures include:  Arthroplasty A portion of the joint is surgically removed and your toe is straightened. The gap fills in with fibrous tissue. This procedure helps treat pain and deformity and helps restore function.  Fusion Cartilage between the two bones of the affected joint is taken out and the bones fuse together into one longer bone. This helps keep your toe stable and reduces pain but leaves your toe stiff, yet straight.  Implantation A portion of your bone is removed and replaced with an implant to restore motion.  Flexor tendon transfers This procedure repositions the tendons that curl the toes down (flexor tendons). This may be done to release the deforming force that causes your toe  to buckle. Several of these procedures require fixing your toe with a pin that is visible at the tip of your toe. The pin keeps the toe straight during healing. Your health care provider will remove the pin usually within 4 8 weeks after the procedure.  Document Released: 10/15/2000 Document Revised: 08/08/2013 Document Reviewed: 06/25/2013 [facility] Patient Information 2014 [place].

## 2014-02-21 NOTE — Progress Notes (Signed)
Patient ID: [name], female   DOB: [birth year], 58 y.o.   MRN: [id number]  Subjective: This patient presents today still complaining of painful fifth toes right more than left. She initially presented on 02/07/2014 complaining of painful skin lesions on the fifth toes with a two-week history. On the initial visit the keratoses of the fifth toes were debrided and she LEAVES were dispensed x2. Patient has had minimal relief and is still quite uncomfortable when walking wearing shoes in the fifth right more than fifth left toe.  Objective: Orientated x71 58 year old black female  Vascular: DP and PT pulses 2/4 bilaterally  Dermatological: Keratoses noted on the dorsal lateral aspect of fifth right left toes with varus rotation  Assessment: Symptomatic hammertoes fifth digits bilaterally, right more symptomatic than left  Plan: At this time had a discussion with patient about treatment options including the possibility of surgical treatment it the toes continue to remain painful with minimal response to conservative care.  I debrided the keratoses on the fifth right toe and applied and padded the toe.  Patient is advised that if the symptoms continue and are not responsive to shoe modification, continued application of gel caps to return for surgical evaluation with Dr. Blenda Mounts.

## 2014-02-22 HISTORY — PX: COSMETIC SURGERY: SHX468

## 2014-03-14 ENCOUNTER — Encounter: Payer: Self-pay | Admitting: Family Medicine

## 2014-03-14 ENCOUNTER — Telehealth: Payer: Self-pay | Admitting: Family Medicine

## 2014-03-14 ENCOUNTER — Ambulatory Visit (INDEPENDENT_AMBULATORY_CARE_PROVIDER_SITE_OTHER): Payer: Commercial Managed Care - HMO | Admitting: Family Medicine

## 2014-03-14 VITALS — BP 118/83 | HR 86 | Temp 98.5°F | Ht 64.0 in | Wt 188.0 lb

## 2014-03-14 DIAGNOSIS — I10 Essential (primary) hypertension: Secondary | ICD-10-CM

## 2014-03-14 DIAGNOSIS — Z9884 Bariatric surgery status: Secondary | ICD-10-CM

## 2014-03-14 DIAGNOSIS — M21619 Bunion of unspecified foot: Secondary | ICD-10-CM

## 2014-03-14 MED ORDER — LISINOPRIL-HYDROCHLOROTHIAZIDE 20-12.5 MG PO TABS: 1.0000 | ORAL_TABLET | Freq: Every day | ORAL | Status: DC

## 2014-03-14 NOTE — Assessment & Plan Note (Signed)
A: well controlled BP, lows at home with fatigue P: decrease prinzide to 20-12.5 mg once daily .  F/u in 3 weeks

## 2014-03-14 NOTE — Telephone Encounter (Signed)
Pt called and needs a referral to go to the [facility]. jw

## 2014-03-14 NOTE — Patient Instructions (Signed)
Jilleen,  Thank you for coming in today.  I do recommend return to your surgeon for further fluid removal from R lower abdomen. The incision site looks great.  For low BPs at home: decrease prinzide to one tablet once daily. Keep monitoring BPs if normal over the next 2-3 weeks we will plan to stop prinzide all together. If low call and I will advise you on what to do.   F/u in 3-4 weeks   Dr. Adrian Blackwater

## 2014-03-14 NOTE — Assessment & Plan Note (Signed)
A: s/p skin removal surgery. 3 weeks ago. Seroma noted. P: advised f/u with plastic surgeon for additional fluid removal.

## 2014-03-14 NOTE — Progress Notes (Signed)
   Subjective:    Patient ID: [name], female    DOB: 1956-10-26, 58 y.o.   MRN: [id number] CC: low BPs  HPI 58 yo F hx of HTN on multiple agents. BP improved s/p gastric bypass surgery:  1. HTN: low BPs at home. Range is 70-90/60s in the evenings. Feeling fatigue. No CP, SOB, syncope, dizziness.   2. S/p skin removal sugery from abdomen: 02/22/14. Went for f/u last week had seroma, drained. Still with pain. ? Persistent fluid.   Soc Hx: non smoker  Review of Systems As per HPI     Objective:   Physical Exam BP 118/83  Pulse 86  Temp(Src) 98.5 F (36.9 C) (Oral)  Ht 5\' 4"  (1.626 m)  Wt 188 lb (85.276 kg)  BMI 32.25 kg/m2 Lungs: clear to auscultation bilaterally Heart: regular rate and rhythm, S1, S2 normal, no murmur, click, rub or gallop Abdomen: healed lower abdominal scar from flank to flank, fluid wave RLQ, mild TTP  Ext: trace edema.     Assessment & Plan:

## 2014-03-28 DIAGNOSIS — M2040 Other hammer toe(s) (acquired), unspecified foot: Secondary | ICD-10-CM

## 2014-03-28 DIAGNOSIS — M21619 Bunion of unspecified foot: Secondary | ICD-10-CM | POA: Insufficient documentation

## 2014-03-28 HISTORY — DX: Bunion of unspecified foot: M21.619

## 2014-04-08 ENCOUNTER — Encounter: Payer: Self-pay | Admitting: Family Medicine

## 2014-04-08 ENCOUNTER — Ambulatory Visit (INDEPENDENT_AMBULATORY_CARE_PROVIDER_SITE_OTHER): Payer: Commercial Managed Care - HMO | Admitting: Family Medicine

## 2014-04-08 VITALS — BP 130/82 | HR 72 | Temp 98.1°F | Ht 64.0 in | Wt 184.1 lb

## 2014-04-08 DIAGNOSIS — Z862 Personal history of diseases of the blood and blood-forming organs and certain disorders involving the immune mechanism: Secondary | ICD-10-CM

## 2014-04-08 DIAGNOSIS — K838 Other specified diseases of biliary tract: Secondary | ICD-10-CM

## 2014-04-08 DIAGNOSIS — I10 Essential (primary) hypertension: Secondary | ICD-10-CM

## 2014-04-08 DIAGNOSIS — Z8639 Personal history of other endocrine, nutritional and metabolic disease: Secondary | ICD-10-CM

## 2014-04-08 HISTORY — DX: Other specified diseases of biliary tract: K83.8

## 2014-04-08 LAB — POCT GLYCOSYLATED HEMOGLOBIN (HGB A1C): Hemoglobin A1C: 5.6

## 2014-04-08 MED ORDER — LISINOPRIL-HYDROCHLOROTHIAZIDE 20-12.5 MG PO TABS
2.0000 | ORAL_TABLET | Freq: Every day | ORAL | Status: DC
Start: 2014-04-08 — End: 2014-08-19

## 2014-04-08 MED ORDER — DOCUSATE SODIUM 100 MG PO CAPS: 100.0000 mg | ORAL_CAPSULE | Freq: Two times a day (BID) | ORAL | Status: DC | PRN

## 2014-04-08 MED ORDER — ZOLPIDEM TARTRATE 10 MG PO TABS: 10.0000 mg | ORAL_TABLET | Freq: Every evening | ORAL | Status: DC | PRN

## 2014-04-08 MED ORDER — URSODIOL 300 MG PO CAPS
300.0000 mg | ORAL_CAPSULE | Freq: Two times a day (BID) | ORAL | Status: DC
Start: 2014-04-08 — End: 2014-08-19

## 2014-04-08 MED ORDER — POLYETHYLENE GLYCOL 3350 17 GM/SCOOP PO POWD: 17.0000 g | Freq: Two times a day (BID) | ORAL | Status: DC

## 2014-04-08 NOTE — Assessment & Plan Note (Signed)
A: BP at goal P: Continue prinzide 40-25 daily.  CMP

## 2014-04-08 NOTE — Assessment & Plan Note (Signed)
A: h/o of well controlled diabetes. Resolved following weight loss. Normal A1c. P: A1c q 1-2 years or as needed for symptoms only.

## 2014-04-08 NOTE — Assessment & Plan Note (Signed)
A: history of gallstone. Last abdominal imaging was a CT that revealed biliary sludge. Taking daily ursodiol. P: Continue ursodiol. To discuss with new PCP at next visit whether or not they should d/c and follow symptoms vs continue indefinitely.

## 2014-04-08 NOTE — Progress Notes (Signed)
   Subjective:    Patient ID: [name], female    DOB: 1956-06-16, 58 y.o.   MRN: [id number] CC: A1c f/u  HPI 58 yo F hx of DM2, HTN, morbid obesity s/p gastric bypass  1. DM2: not medication. No polydipsia or polyuria.   2. HTN: taking prinzide. Elevated BP on 20-12.5 went back up to 40-25. No CP, SOB, LE edema.  3. Med review: taking all meds. Taking ursodiol. Sometimes has abdominal pain when she has too much fluid. Has to force emesis to get relief. No RUQ pain. No diarrhea or loose stools.   Review of Systems As per HPI     Objective:   Physical Exam BP 130/82  Pulse 72  Temp(Src) 98.1 F (36.7 C) (Oral)  Ht 5\' 4"  (1.626 m)  Wt 184 lb 1.6 oz (83.507 kg)  BMI 31.59 kg/m2 General appearance: alert, cooperative and no distress Lungs: clear to auscultation bilaterally Heart: regular rate and rhythm, S1, S2 normal, no murmur, click, rub or gallop Abdomen: soft, healed horizontal surgical scar from skin removal surgery  Extremities: edema trace at ankles only   Lab Results  Component Value Date   HGBA1C 5.6 04/08/2014      Assessment & Plan:

## 2014-04-08 NOTE — Patient Instructions (Signed)
Jaicee,  Thank you for coming in today. A1c is normal.  Refilled medications.  Plan for f/u in 3 months sooner if needed you will meet your new PCP then.  Dr. Adrian Blackwater

## 2014-04-22 ENCOUNTER — Ambulatory Visit (INDEPENDENT_AMBULATORY_CARE_PROVIDER_SITE_OTHER): Payer: Commercial Managed Care - HMO | Admitting: Family Medicine

## 2014-04-22 ENCOUNTER — Telehealth: Payer: Self-pay | Admitting: Family Medicine

## 2014-04-22 ENCOUNTER — Encounter: Payer: Self-pay | Admitting: Family Medicine

## 2014-04-22 VITALS — BP 129/89 | HR 85 | Temp 98.2°F | Wt 187.0 lb

## 2014-04-22 DIAGNOSIS — R252 Cramp and spasm: Secondary | ICD-10-CM | POA: Insufficient documentation

## 2014-04-22 DIAGNOSIS — K838 Other specified diseases of biliary tract: Secondary | ICD-10-CM

## 2014-04-22 HISTORY — DX: Cramp and spasm: R25.2

## 2014-04-22 LAB — BASIC METABOLIC PANEL
BUN: 19 mg/dL (ref 6–23)
CO2: 25 mEq/L (ref 19–32)
Calcium: 9.4 mg/dL (ref 8.4–10.5)
Chloride: 105 mEq/L (ref 96–112)
Creat: 0.71 mg/dL (ref 0.50–1.10)
Glucose, Bld: 87 mg/dL (ref 70–99)
Potassium: 3.3 mEq/L — ABNORMAL LOW (ref 3.5–5.3)
Sodium: 141 mEq/L (ref 135–145)

## 2014-04-22 NOTE — Telephone Encounter (Signed)
Pt called and would like Dr. Adrian Blackwater to call her concerning her tests today. jw

## 2014-04-22 NOTE — Assessment & Plan Note (Addendum)
A: cramps in hands and legs. Patient has had this problem before. Last BMP was normal. Normal exam. P: Repeat BMP today. Plan trial of oral potassium vs increase dietary potassium. Encouraged patient to stay well hydrated.   Reviewed labs K+ 3.3 Supplemental K+ sent in.

## 2014-04-22 NOTE — Patient Instructions (Addendum)
[name],  Excellent BP. Checking electrolytes for cramps. Plan to take supplemental potassium vs eating more potassium in diet (see info below).  I will be in touch with results.  Dr. Adrian Blackwater   Potassium Content of Foods Potassium is a mineral found in many foods and drinks. It helps keep fluids and minerals balanced in your body and affects how steadily your heart beats. Potassium also helps control your blood pressure and keep your muscles and nervous system healthy. Certain health conditions and medicines may change the balance of potassium in your body. When this happens, you can help balance your level of potassium through the foods that you do or do not eat. Your health care provider or dietitian may recommend an amount of potassium that you should have each day. The following lists of foods provide the amount of potassium (in parentheses) per serving in each item. HIGH IN POTASSIUM  The following foods and beverages have 200 mg or more of potassium per serving:  Apricots, 2 raw or 5 dry (200 mg).  Artichoke, 1 medium (345 mg).  Avocado, raw,  each (245 mg).  Banana, 1 medium (425 mg).  Beans, lima, or baked beans, canned,  cup (280 mg).  Beans, [place], canned,  cup (595 mg).  Beef roast, 3 oz (320 mg).  Beef, ground, 3 oz (270 mg).  Beets, raw or cooked,  cup (260 mg).  Bran muffin, 2 oz (300 mg).  Broccoli,  cup (230 mg).  Brussels sprouts,  cup (250 mg).  Cantaloupe,  cup (215 mg).  Cereal, 100% bran,  cup (200-400 mg).  Cheeseburger, single, fast food, 1 each (225-400 mg).  Chicken, 3 oz (220 mg).  Clams, canned, 3 oz (535 mg).  Crab, 3 oz (225 mg).  Dates, 5 each (270 mg).  Dried beans and peas,  cup (300-475 mg).  Figs, dried, 2 each (260 mg).  Fish: halibut, tuna, cod, snapper, 3 oz (480 mg).  Fish: salmon, haddock, swordfish, perch, 3 oz (300 mg).  Fish, tuna, canned 3 oz (200 mg).  [place] fries, fast food, 3 oz (470 mg).  Granola  with fruit and nuts,  cup (200 mg).  Grapefruit juice,  cup (200 mg).  Greens, beet,  cup (655 mg).  Honeydew melon,  cup (200 mg).  Kale, raw, 1 cup (300 mg).  Kiwi, 1 medium (240 mg).  Kohlrabi, rutabaga, parsnips,  cup (280 mg).  Lentils,  cup (365 mg).  Mango, 1 each (325 mg).  Milk, chocolate, 1 cup (420 mg).  Milk: nonfat, low-fat, whole, buttermilk, 1 cup (350-380 mg).  Molasses, 1 Tbsp (295 mg).  Mushrooms,  cup (280) mg.  Nectarine, 1 each (275 mg).  Nuts: almonds, peanuts, hazelnuts, [place], cashew, mixed, 1 oz (200 mg).  Nuts, pistachios, 1 oz (295 mg).  Orange, 1 each (240 mg).  Orange juice,  cup (235 mg).  Papaya, medium,  fruit (390 mg).  Peanut butter, chunky, 2 Tbsp (240 mg).  Peanut butter, smooth, 2 Tbsp (210 mg).  Pear, 1 medium (200 mg).  Pomegranate, 1 whole (400 mg).  Pomegranate juice,  cup (215 mg).  Pork, 3 oz (350 mg).  Potato chips, salted, 1 oz (465 mg).  Potato, baked with skin, 1 medium (925 mg).  Potatoes, boiled,  cup (255 mg).  Potatoes, mashed,  cup (330 mg).  Prune juice,  cup (370 mg).  Prunes, 5 each (305 mg).  Pudding, chocolate,  cup (230 mg).  Pumpkin, canned,  cup (250 mg).  Raisins,  seedless,  cup (270 mg).  Seeds, sunflower or pumpkin, 1 oz (240 mg).  Soy milk, 1 cup (300 mg).  Spinach,  cup (420 mg).  Spinach, canned,  cup (370 mg).  Sweet potato, baked with skin, 1 medium (450 mg).  Swiss chard,  cup (480 mg).  Tomato or vegetable juice,  cup (275 mg).  Tomato sauce or puree,  cup (400-550 mg).  Tomato, raw, 1 medium (290 mg).  Tomatoes, canned,  cup (200-300 mg).  [place], 3 oz (250 mg).  Wheat germ, 1 oz (250 mg).  Winter squash,  cup (250 mg).  Yogurt, plain or fruited, 6 oz (260-435 mg).  Zucchini,  cup (220 mg). MODERATE IN POTASSIUM The following foods and beverages have 50-200 mg of potassium per serving:  Apple, 1 each (150 mg).  Apple  juice,  cup (150 mg).  Applesauce,  cup (90 mg).  Apricot nectar,  cup (140 mg).  Asparagus, small spears,  cup or 6 spears (155 mg).  Bagel, cinnamon raisin, 1 each (130 mg).  Bagel, egg or plain, 4 in., 1 each (70 mg).  Beans, green,  cup (90 mg).  Beans, yellow,  cup (190 mg).  Beer, regular, 12 oz (100 mg).  Beets, canned,  cup (125 mg).  Blackberries,  cup (115 mg).  Blueberries,  cup (60 mg).  Bread, whole wheat, 1 slice (70 mg).  Broccoli, raw,  cup (145 mg).  Cabbage,  cup (150 mg).  Carrots, cooked or raw,  cup (180 mg).  Cauliflower, raw,  cup (150 mg).  Celery, raw,  cup (155 mg).  Cereal, bran flakes, cup (120-150 mg).  Cheese, cottage,  cup (110 mg).  Cherries, 10 each (150 mg).  Chocolate, 1 oz bar (165 mg).  Coffee, brewed 6 oz (90 mg).  Corn,  cup or 1 ear (195 mg).  Cucumbers,  cup (80 mg).  Egg, large, 1 each (60 mg).  Eggplant,  cup (60 mg).  Endive, raw, cup (80 mg).  English muffin, 1 each (65 mg).  Fish, orange roughy, 3 oz (150 mg).  Frankfurter, beef or pork, 1 each (75 mg).  Fruit cocktail,  cup (115 mg).  Grape juice,  cup (170 mg).  Grapefruit,  fruit (175 mg).  Grapes,  cup (155 mg).  Greens: kale, turnip, collard,  cup (110-150 mg).  Ice cream or frozen yogurt, chocolate,  cup (175 mg).  Ice cream or frozen yogurt, vanilla,  cup (120-150 mg).  Lemons, limes, 1 each (80 mg).  Lettuce, all types, 1 cup (100 mg).  Mixed vegetables,  cup (150 mg).  Mushrooms, raw,  cup (110 mg).  Nuts: walnuts, pecans, or macadamia, 1 oz (125 mg).  Oatmeal,  cup (80 mg).  Okra,  cup (110 mg).  Onions, raw,  cup (120 mg).  Peach, 1 each (185 mg).  Peaches, canned,  cup (120 mg).  Pears, canned,  cup (120 mg).  Peas, green, frozen,  cup (90 mg).  Peppers, green,  cup (130 mg).  Peppers, red,  cup (160 mg).  Pineapple juice,  cup (165 mg).  Pineapple, fresh or canned,   cup (100 mg).  Plums, 1 each (105 mg).  Pudding, vanilla,  cup (150 mg).  Raspberries,  cup (90 mg).  Rhubarb,  cup (115 mg).  Rice, wild,  cup (80 mg).  Shrimp, 3 oz (155 mg).  Spinach, raw, 1 cup (170 mg).  Strawberries,  cup (125 mg).  Summer squash  cup (175-200 mg).  Swiss  chard, raw, 1 cup (135 mg).  Tangerines, 1 each (140 mg).  Tea, brewed, 6 oz (65 mg).  Turnips,  cup (140 mg).  Watermelon,  cup (85 mg).  Wine, red, table, 5 oz (180 mg).  Wine, [place], table, 5 oz (100 mg). LOW IN POTASSIUM The following foods and beverages have less than 50 mg of potassium per serving.  Bread, [place], 1 slice (30 mg).  Carbonated beverages, 12 oz (less than 5 mg).  Cheese, 1 oz (20-30 mg).  Cranberries,  cup (45 mg).  Cranberry juice cocktail,  cup (20 mg).  Fats and oils, 1 Tbsp (less than 5 mg).  Hummus, 1 Tbsp (32 mg).  Nectar: papaya, mango, or pear,  cup (35 mg).  Rice, Dino or brown,  cup (50 mg).  Spaghetti or macaroni,  cup cooked (30 mg).  Tortilla, flour or corn, 1 each (50 mg).  Waffle, 4 in., 1 each (50 mg).  Water chestnuts,  cup (40 mg). Document Released: 06/01/2005 Document Revised: 10/23/2013 Document Reviewed: 09/14/2013 [facility] Patient Information 2015 [place], [place]. This information is not intended to replace advice given to you by your health care provider. Make sure you discuss any questions you have with your health care provider.

## 2014-04-22 NOTE — Progress Notes (Signed)
   Subjective:    Patient ID: [name], female    DOB: 1956-06-06, 58 y.o.   MRN: [id number] CC: cramps in fingers and legs  HPI 58 yo F presents with cramping in fingers and legs (thighs and legg) that occurs off and on for the past 3 weeks. Symptoms occur at rest. There is no muscle tenderness or joint swelling. She continues to exercise. She reports good PO intake of fluids. Only medication change is increasing prinzide dose for elevated BP.   Soc Hx: non smoker  Review of Systems As per HPI     Objective:   Physical Exam BP 129/89  Pulse 85  Temp(Src) 98.2 F (36.8 C) (Oral)  Wt 187 lb (84.823 kg) Wt Readings from Last 3 Encounters:  04/22/14 187 lb (84.823 kg)  04/08/14 184 lb 1.6 oz (83.507 kg)  03/14/14 188 lb (85.276 kg)  General appearance: alert, cooperative and no distress Lungs: normal WOB Extremities: extremities normal, atraumatic, no cyanosis or edema, no muscle tenderness in legs or hands. Normal strength. No rash or skin changes.      Assessment & Plan:

## 2014-04-23 MED ORDER — POTASSIUM CHLORIDE CRYS ER 20 MEQ PO TBCR: 20.0000 meq | EXTENDED_RELEASE_TABLET | Freq: Every day | ORAL | Status: DC

## 2014-04-23 NOTE — Telephone Encounter (Signed)
Called patient. Gave bloodwork results for her and her sister. Plan: For [place]: start oral K+ daily then prn.  For [place]: stop colchicine and allopurinol.  Crystalle agreed with plan and voiced understanding.

## 2014-04-23 NOTE — Addendum Note (Signed)
Addended by: Boykin Nearing on: 04/23/2014 09:24 AM   Modules accepted: Orders

## 2014-04-24 ENCOUNTER — Telehealth: Payer: Self-pay | Admitting: *Deleted

## 2014-04-24 NOTE — Telephone Encounter (Signed)
Supposed to be seeing another doctor about doing surgery on my toe.  Call me back.  I returned her call.  She said she wanted to go ahead and schedule an appointment with the doctor.  I told her I would have to transfer her to a scheduler to make an appointment with Dr. Blenda Mounts.

## 2014-05-10 ENCOUNTER — Ambulatory Visit (INDEPENDENT_AMBULATORY_CARE_PROVIDER_SITE_OTHER): Payer: Commercial Managed Care - HMO

## 2014-05-10 VITALS — BP 143/92 | HR 73 | Resp 18

## 2014-05-10 DIAGNOSIS — M79606 Pain in leg, unspecified: Secondary | ICD-10-CM

## 2014-05-10 DIAGNOSIS — M79609 Pain in unspecified limb: Secondary | ICD-10-CM

## 2014-05-10 DIAGNOSIS — M2040 Other hammer toe(s) (acquired), unspecified foot: Secondary | ICD-10-CM

## 2014-05-10 NOTE — Patient Instructions (Signed)
Pre-Operative Instructions  Congratulations, you have decided to take an important step to improving your quality of life.  You can be assured that the doctors of [facility] will be with you every step of the way.  1. Plan to be at the surgery center/hospital at least 1 (one) hour prior to your scheduled time unless otherwise directed by the surgical center/hospital staff.  You must have a responsible adult accompany you, remain during the surgery and drive you home.  Make sure you have directions to the surgical center/hospital and know how to get there on time. 2. For hospital based surgery you will need to obtain a history and physical form from your family physician within 1 month prior to the date of surgery- we will give you a form for you primary physician.  3. We make every effort to accommodate the date you request for surgery.  There are however, times where surgery dates or times have to be moved.  We will contact you as soon as possible if a change in schedule is required.   4. No Aspirin/Ibuprofen for one week before surgery.  If you are on aspirin, any non-steroidal anti-inflammatory medications (Mobic, Aleve, Ibuprofen) you should stop taking it 7 days prior to your surgery.  You make take Tylenol  For pain prior to surgery.  5. Medications- If you are taking daily heart and blood pressure medications, seizure, reflux, allergy, asthma, anxiety, pain or diabetes medications, make sure the surgery center/hospital is aware before the day of surgery so they may notify you which medications to take or avoid the day of surgery. 6. No food or drink after midnight the night before surgery unless directed otherwise by surgical center/hospital staff. 7. No alcoholic beverages 24 hours prior to surgery.  No smoking 24 hours prior to or 24 hours after surgery. 8. Wear loose pants or shorts- loose enough to fit over bandages, boots, and casts. 9. No slip on shoes, sneakers are best. 10. Bring  your boot with you to the surgery center/hospital.  Also bring crutches or a walker if your physician has prescribed it for you.  If you do not have this equipment, it will be provided for you after surgery. 11. If you have not been contracted by the surgery center/hospital by the day before your surgery, call to confirm the date and time of your surgery. 12. Leave-time from work may vary depending on the type of surgery you have.  Appropriate arrangements should be made prior to surgery with your employer. 13. Prescriptions will be provided immediately following surgery by your doctor.  Have these filled as soon as possible after surgery and take the medication as directed. 14. Remove nail polish on the operative foot. 15. Wash the night before surgery.  The night before surgery wash the foot and leg well with the antibacterial soap provided and water paying special attention to beneath the toenails and in between the toes.  Rinse thoroughly with water and dry well with a towel.  Perform this wash unless told not to do so by your physician.  Enclosed: 1 Ice pack (please put in freezer the night before surgery)   1 Hibiclens skin cleaner   Pre-op Instructions  If you have any questions regarding the instructions, do not hesitate to call our office.  [place]: [address] [place], [place] [address] [phone]  [place]: [address] [place], [place] [address] [phone]  [place]: [address]  [place], [place] [address] [phone]  Dr. Marigold Mom   Tuchman DPM, Dr. Norman Regal DPM Dr. Elisha Cooksey DPM, Dr. M. Todd Hyatt DPM, Dr. Kathryn Egerton DPM 

## 2014-05-10 NOTE — Progress Notes (Signed)
   Subjective:    Patient ID: [name], female    DOB: 1956/08/13, 58 y.o.   MRN: [id number]  HPI I SEEN DR Amalia Hailey AND HE REFERRED ME TO YOU AND I WANT TO TALK ABOUT HAVING SURGERY ON MY 5TH TOE ON MY RIGHT FOOT    Review of Systems no new findings or systemic changes since last evaluation with Dr. Amalia Hailey     Objective:   Physical Exam 58 year old vascular can female presents this time a referral from Dr. 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 Patient had previous conservative care trimming padding cushion with no long-term success at this time is requesting surgical intervention with Dr. Phoebe Perch and my recommendation. 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 Patient also has HAV deformity bilateral and deviation lesser digits laterally on both feet has had previous bunion surgery on the right foot however has recurrence at this time. Bunions are asymptomatic orthopedic biomechanical exam x-rays confirm adductovarus rotated fifth digits with his middle and distal phalanx. No open wounds ulcerations no signs of infection at current time.      Assessment & Plan:  Assessment hammertoe deformity with associated keratoses fifth digits bilateral right more so than left plan at this time discussed options risks medications alternatives were reviewed and consent form for hammertoe repair fifth digits bilateral with derotational arthroplasty. Patient understands risks and alternatives is ready schedule surgery understands be done under IV sedation  and local anesthetic block will be in a Darco surgical shoe for proxy 4-5 weeks postop with minimal moderate walking activities initially and each week increasing walking and standing activities. Patient will followup postoperatively for at least 3 months until complete healing resolutions occurred. Consent form is reviewed and signed and surgery scheduled at this time.    Harriet Masson DPM

## 2014-05-27 ENCOUNTER — Telehealth: Payer: Self-pay | Admitting: *Deleted

## 2014-05-27 DIAGNOSIS — M2040 Other hammer toe(s) (acquired), unspecified foot: Secondary | ICD-10-CM

## 2014-05-27 NOTE — Telephone Encounter (Signed)
Call about a prescription.  We have questions about the strength.

## 2014-05-27 NOTE — Telephone Encounter (Signed)
I returned their call.  She stated that the patient's insurance will not cover Vicodin 5/300.  They wanted to know if they could change it to Vicodin 5/325.  I okayed the change.

## 2014-05-30 ENCOUNTER — Telehealth: Payer: Self-pay

## 2014-06-04 ENCOUNTER — Ambulatory Visit (INDEPENDENT_AMBULATORY_CARE_PROVIDER_SITE_OTHER): Payer: Commercial Managed Care - HMO

## 2014-06-04 VITALS — BP 130/86 | HR 76 | Temp 97.8°F | Resp 12

## 2014-06-04 DIAGNOSIS — Z09 Encounter for follow-up examination after completed treatment for conditions other than malignant neoplasm: Secondary | ICD-10-CM

## 2014-06-04 DIAGNOSIS — M2040 Other hammer toe(s) (acquired), unspecified foot: Secondary | ICD-10-CM

## 2014-06-04 NOTE — Progress Notes (Signed)
   Subjective:    Patient ID: [name], female    DOB: 11/09/1955, 58 y.o.   MRN: [id number]  HPI DOS 05/27/14 HAMMER TOE REPAIR 5TH TOE B/L FOOT.    ''B/L TOE ARE DOING OK, BUT AT NIGHT GET A LITTLE SORE.''   Review of Systems no new findings or systemic changes noted     Objective:   Physical Exam Neurovascular status is intact pedal pulses are palpable epicritic and proprioceptive sensations intact mild ecchymosis consistent with postop course. X-rays revealed adequate resection of bone proximal phalanx as well and so partial nail border excision for blisters corn. No discharge or drainage no signs of infection minimal pain or discomfort noted       Assessment & Plan:  Assessment good postop progress following hammertoe repair and derotational arthroplasty fifth toes bilateral and excision keratosis plan at this time patient will continue with dressing dry sterile compressive dressing reapplied at this time reappointed in one week plan for suture removal maintain Darco shoe at all times may remove it for sleeping but did not ambulate without tissue. One week followup for suture removal  Harriet Masson DPM

## 2014-06-04 NOTE — Patient Instructions (Signed)

## 2014-06-07 NOTE — Telephone Encounter (Signed)
disregard

## 2014-06-07 NOTE — Progress Notes (Signed)
Dr Blenda Mounts performed a hammertoe repair  Bilateral 5th met on 05/27/14 Vicodin 5325 #40 1-2 q6hrs prn pain cephalexin 570m 1 q6hrs

## 2014-06-14 ENCOUNTER — Ambulatory Visit (INDEPENDENT_AMBULATORY_CARE_PROVIDER_SITE_OTHER): Payer: Commercial Managed Care - HMO

## 2014-06-14 VITALS — BP 112/64 | HR 64 | Resp 17

## 2014-06-14 DIAGNOSIS — Z09 Encounter for follow-up examination after completed treatment for conditions other than malignant neoplasm: Secondary | ICD-10-CM

## 2014-06-14 DIAGNOSIS — M79609 Pain in unspecified limb: Secondary | ICD-10-CM

## 2014-06-14 DIAGNOSIS — M79606 Pain in leg, unspecified: Secondary | ICD-10-CM

## 2014-06-14 DIAGNOSIS — M2040 Other hammer toe(s) (acquired), unspecified foot: Secondary | ICD-10-CM

## 2014-06-14 NOTE — Progress Notes (Signed)
   Subjective:    Patient ID: [name], female    DOB: January 23, 1956, 58 y.o.   MRN: [id number]  HPI  Pt presents for post op visit #2 DOS 05/27/14. Sutures were removed, wound edges aligned and approximated, no drainage noted  Review of Systems no new findings or systemic changes     Objective:   Physical Exam Neurovascular status is intact pedal pulses are palpable incisions clean dry well coapted sutures removed at this time Neosporin Coflex wrap applied dispensing Coflex for wrapping at home maintain a Darco shoe for 2 more weeks then a comfortable athletic or walking shoe. No exercise or gym activities of the upper body       Assessment & Plan:  Assessment good postop progress presto dressing removed Coflex wrap and applied maintain Darco for 2 more weeks reappointed one month for long-term followup and x-rays  Harriet Masson DPM

## 2014-06-14 NOTE — Patient Instructions (Signed)
ICE INSTRUCTIONS  Apply ice or cold pack to the affected area at least 3 times a day for 10-15 minutes each time.  You should also use ice after prolonged activity or vigorous exercise.  Do not apply ice longer than 20 minutes at one time.  Always keep a cloth between your skin and the ice pack to prevent burns.  Being consistent and following these instructions will help control your symptoms.  We suggest you purchase a gel ice pack because they are reusable and do bit leak.  Some of them are designed to wrap around the area.  Use the method that works best for you.  Here are some other suggestions for icing.   Use a frozen bag of peas or corn-inexpensive and molds well to your body, usually stays frozen for 10 to 20 minutes.  Wet a towel with cold water and squeeze out the excess until it's damp.  Place in a bag in the freezer for 20 minutes. Then remove and use.  Use ice to keep some swelling and aching and throbbing. Next  Also maintain Coflex wrap in utilizing the blue elastic wrap to keep each of the fifth toes wrapped with mild compression for the next 3 or 4 weeks. May resume normal bathing and washing then apply Neosporin and a Coflex wrap as instructed

## 2014-07-12 ENCOUNTER — Ambulatory Visit: Payer: Self-pay

## 2014-07-12 ENCOUNTER — Ambulatory Visit (INDEPENDENT_AMBULATORY_CARE_PROVIDER_SITE_OTHER): Payer: Commercial Managed Care - HMO

## 2014-07-12 DIAGNOSIS — M2040 Other hammer toe(s) (acquired), unspecified foot: Secondary | ICD-10-CM

## 2014-07-15 ENCOUNTER — Other Ambulatory Visit: Payer: Self-pay

## 2014-07-15 DIAGNOSIS — Z1231 Encounter for screening mammogram for malignant neoplasm of breast: Secondary | ICD-10-CM

## 2014-07-25 ENCOUNTER — Encounter: Payer: Self-pay | Admitting: Family Medicine

## 2014-07-25 ENCOUNTER — Ambulatory Visit (INDEPENDENT_AMBULATORY_CARE_PROVIDER_SITE_OTHER): Payer: Commercial Managed Care - HMO | Admitting: Family Medicine

## 2014-07-25 ENCOUNTER — Ambulatory Visit
Admission: RE | Admit: 2014-07-25 | Discharge: 2014-07-25 | Disposition: A | Discharge status: Home / Self Care | Payer: Commercial Managed Care - HMO | Source: Ambulatory Visit

## 2014-07-25 ENCOUNTER — Encounter (INDEPENDENT_AMBULATORY_CARE_PROVIDER_SITE_OTHER): Payer: Self-pay

## 2014-07-25 VITALS — BP 128/84 | HR 80 | Ht 64.0 in | Wt 193.0 lb

## 2014-07-25 DIAGNOSIS — Z23 Encounter for immunization: Secondary | ICD-10-CM

## 2014-07-25 DIAGNOSIS — Z1231 Encounter for screening mammogram for malignant neoplasm of breast: Secondary | ICD-10-CM

## 2014-07-25 DIAGNOSIS — M25519 Pain in unspecified shoulder: Secondary | ICD-10-CM

## 2014-07-25 DIAGNOSIS — M25511 Pain in right shoulder: Secondary | ICD-10-CM

## 2014-07-25 DIAGNOSIS — M25512 Pain in left shoulder: Secondary | ICD-10-CM | POA: Insufficient documentation

## 2014-07-25 MED ORDER — CYCLOBENZAPRINE HCL 10 MG PO TABS: 10.0000 mg | ORAL_TABLET | Freq: Three times a day (TID) | ORAL | Status: DC | PRN

## 2014-07-25 NOTE — Progress Notes (Signed)
Patient ID: [name], female   DOB: 10-Dec-1955, 58 y.o.   MRN: [id number]   [facility] Bernadene Bell, MD Phone: [phone]  Subjective:  [name] is a 58 y.o F who presents with discomfort in her lower shoulder  # Pain -currently c/o 6/10 shoulder discomfort  -s/p right shoulder arthroscopy in oct 2014- with excision of clavicle and rotator cuff repair -suddenly woke up one morning about 1 month ago with needles and pins feeling in her back (mostly by her lower scapular) -since that time happens randomly (currently bothering her today); unclear precipitant -still able to work out but has some difficulty lifting with overhead press- has not caused her to drop weight  All relevant systems were reviewed and were negative unless otherwise noted in the HPI  Past Medical History Reviewed problem list.  Medications- reviewed and updated Current Outpatient Prescriptions  Medication Sig Dispense Refill  . atorvastatin (LIPITOR) 40 MG tablet Take 1 tablet (40 mg total) by mouth daily.  90 tablet  3  . B Complex Vitamins (VITAMIN-B COMPLEX) TABS 1 tablet.      . calcium carbonate (OS-CAL) 600 MG TABS tablet Take 1 tablet (600 mg total) by mouth 2 (two) times daily with a meal.  180 tablet  1  . docusate sodium (COLACE) 100 MG capsule Take 1 capsule (100 mg total) by mouth 2 (two) times daily as needed. For constipation  180 capsule  1  . esomeprazole (NEXIUM) 40 MG capsule Take 1 capsule (40 mg total) by mouth daily before breakfast.  90 capsule  1  . ferrous sulfate 220 (44 FE) MG/5ML solution TAKE 6.46ml DAILY 30 MINUTES BEFORE BREAKFAST MIX WITH 1/2 GLASS OF ORANGE JUICE  240 mL  3  . fluticasone (FLONASE) 50 MCG/ACT nasal spray Place 2 sprays into both nostrils at bedtime.  16 g  6  . lisinopril-hydrochlorothiazide (PRINZIDE,ZESTORETIC) 20-12.5 MG per tablet Take 2 tablets by mouth daily.  180 tablet  3  . loratadine (CLARITIN) 10 MG tablet Take 1 tablet (10 mg  total) by mouth daily.  30 tablet  11  . Multiple Vitamin (MULTIVITAMIN) tablet Take 1 tablet by mouth daily.  90 tablet  1  . polyethylene glycol powder (GLYCOLAX/MIRALAX) powder Take 17 g by mouth 2 (two) times daily.  850 g  1  . potassium chloride SA (K-DUR,KLOR-CON) 20 MEQ tablet Take 1 tablet (20 mEq total) by mouth daily.  30 tablet  0  . ursodiol (ACTIGALL) 300 MG capsule Take 1 capsule (300 mg total) by mouth 2 (two) times daily.  180 capsule  1  . vitamin B-12 (CYANOCOBALAMIN) 100 MCG tablet Take 100 mcg by mouth daily.      . vitamin C (ASCORBIC ACID) 250 MG tablet 250 mg.      . zolpidem (AMBIEN) 10 MG tablet Take 1 tablet (10 mg total) by mouth at bedtime as needed for sleep.  30 tablet  2   No current facility-administered medications for this visit.   Chief complaint-noted No additions to family history Social history- patient is a never smoker  Objective: Ht 5\' 4"  (1.626 m)  Wt 193 lb (87.544 kg)  BMI 33.11 kg/m2 Gen: NAD, alert, cooperative with exam HEENT: NCAT, EOMI Neck: FROM, supple Ext: No edema, warm, normal tone, shoulder normal by inspection; no palpable step offs; moves UE/LE spontaneously; neg empty can test, neg scratch test, full external internal range of motion; discomfort on palpation of the subscapular region  Neuro: Alert and oriented, No gross deficits Skin: no rashes no lesions  Assessment/Plan: See problem based a/p

## 2014-07-25 NOTE — Patient Instructions (Signed)
Ms Nishi it was great to meet you today!  I am sorry that you are not feeling well.  Lets start with tylenol and flexeril for muscular spasms  Go back to modified exercise on the shoulder to give it some rest  Alternate between ice and heat for symptoms Please return to clinic if symptoms do not improve or worsen Feel better soon Bernadene Bell, MD

## 2014-07-25 NOTE — Assessment & Plan Note (Signed)
Does not appear to be involving the rotator cuff Given the history of repair here however would consider possibility of post-operative changes if not better with conservative treatment Will go back to [facility] and exercises during that time of surg last october Suspect has mm strain will trial flexeril  F/up in 2 weeks if no better

## 2014-07-30 ENCOUNTER — Ambulatory Visit (INDEPENDENT_AMBULATORY_CARE_PROVIDER_SITE_OTHER): Payer: Commercial Managed Care - HMO

## 2014-07-30 VITALS — BP 143/88 | HR 83 | Resp 16

## 2014-07-30 DIAGNOSIS — M2040 Other hammer toe(s) (acquired), unspecified foot: Secondary | ICD-10-CM

## 2014-07-30 DIAGNOSIS — Z09 Encounter for follow-up examination after completed treatment for conditions other than malignant neoplasm: Secondary | ICD-10-CM

## 2014-07-30 NOTE — Patient Instructions (Signed)
ICE INSTRUCTIONS  Apply ice or cold pack to the affected area at least 3 times a day for 10-15 minutes each time.  You should also use ice after prolonged activity or vigorous exercise.  Do not apply ice longer than 20 minutes at one time.  Always keep a cloth between your skin and the ice pack to prevent burns.  Being consistent and following these instructions will help control your symptoms.  We suggest you purchase a gel ice pack because they are reusable and do bit leak.  Some of them are designed to wrap around the area.  Use the method that works best for you.  Here are some other suggestions for icing.   Use a frozen bag of peas or corn-inexpensive and molds well to your body, usually stays frozen for 10 to 20 minutes.  Wet a towel with cold water and squeeze out the excess until it's damp.  Place in a bag in the freezer for 20 minutes. Then remove and use.   Recommendations for proper in proper shoe look into shoes such as Rockport, SAS shoes, easy spirits. Avoid entire constrictive no barefoot or flimsy shoes

## 2014-07-30 NOTE — Progress Notes (Signed)
   Subjective:    Patient ID: [name], female    DOB: 1956-08-31, 58 y.o.   MRN: [id number]  HPI Comments: "They feel like they are doing fine"  DOS 05-27-2014 POV Hammer toe 5th bilateral     Review of Systems no new findings or systemic changes noted    Objective:   Physical Exam Assessment good postop progress patient has intact neurovascular status pedal pulses are palpable DP postal for PT posterior were for Refill time 3 seconds has residual bunion deformity of the fifth toes bilateral showed adequate resection of bone with reduction adductovarus rotation no recurrence of corns or calluses no pain no tenderness minimal or no edema noted mild digital contractures remaining digits are noted. Patient angulating comfortable accommodative type athletic or casual shoes or sandals       Assessment & Plan:  Assessment good postop progress following hammertoe repair fifth toes bilateral patient is ready for discharge no complaints of pain discomfort or tenderness no corns or calcification at this time patient has intact neurovascular status does have diabetes with no significant complications to ask about diabetic shoe program however my recommendation is an SAS or Rockport shoe without any significant complications patient does no significant neuropathy or angiopathy speaking at this time although there are some mild deformities these been corrected for with surgery at this time and be candidate for future bunion correction is well suggested followup in the future and as-needed basis if there is new problems or exacerbations otherwise discharged for postop standpoint  Harriet Masson DPM

## 2014-08-08 NOTE — Progress Notes (Signed)
Patient ID: [name], female   DOB: 15-Jan-1956, 58 y.o.   MRN: [id number] No-show patient

## 2014-08-19 ENCOUNTER — Ambulatory Visit (INDEPENDENT_AMBULATORY_CARE_PROVIDER_SITE_OTHER): Payer: Commercial Managed Care - HMO | Admitting: Family Medicine

## 2014-08-19 ENCOUNTER — Encounter: Payer: Self-pay | Admitting: Family Medicine

## 2014-08-19 VITALS — BP 140/90 | HR 93 | Temp 98.5°F | Ht 64.0 in | Wt 197.5 lb

## 2014-08-19 DIAGNOSIS — R252 Cramp and spasm: Secondary | ICD-10-CM

## 2014-08-19 DIAGNOSIS — I10 Essential (primary) hypertension: Secondary | ICD-10-CM

## 2014-08-19 DIAGNOSIS — M25511 Pain in right shoulder: Secondary | ICD-10-CM

## 2014-08-19 DIAGNOSIS — E785 Hyperlipidemia, unspecified: Secondary | ICD-10-CM

## 2014-08-19 LAB — COMPREHENSIVE METABOLIC PANEL
ALT: 11 U/L (ref 0–35)
AST: 19 U/L (ref 0–37)
Albumin: 4.1 g/dL (ref 3.5–5.2)
Alkaline Phosphatase: 80 U/L (ref 39–117)
BUN: 13 mg/dL (ref 6–23)
CO2: 28 mEq/L (ref 19–32)
Calcium: 9.3 mg/dL (ref 8.4–10.5)
Chloride: 106 mEq/L (ref 96–112)
Creat: 0.69 mg/dL (ref 0.50–1.10)
Glucose, Bld: 83 mg/dL (ref 70–99)
Potassium: 4.1 mEq/L (ref 3.5–5.3)
Sodium: 141 mEq/L (ref 135–145)
Total Bilirubin: 0.3 mg/dL (ref 0.2–1.2)
Total Protein: 6.5 g/dL (ref 6.0–8.3)

## 2014-08-19 LAB — LDL CHOLESTEROL, DIRECT: Direct LDL: 37 mg/dL

## 2014-08-19 MED ORDER — DOCUSATE SODIUM 100 MG PO CAPS: 100.0000 mg | ORAL_CAPSULE | Freq: Two times a day (BID) | ORAL | Status: DC | PRN

## 2014-08-19 MED ORDER — CYCLOBENZAPRINE HCL 10 MG PO TABS: 10.0000 mg | ORAL_TABLET | Freq: Three times a day (TID) | ORAL | Status: DC | PRN

## 2014-08-19 MED ORDER — URSODIOL 300 MG PO CAPS: 300.0000 mg | ORAL_CAPSULE | Freq: Two times a day (BID) | ORAL | Status: DC

## 2014-08-19 MED ORDER — LISINOPRIL-HYDROCHLOROTHIAZIDE 20-12.5 MG PO TABS: 2.0000 | ORAL_TABLET | Freq: Every day | ORAL | Status: DC

## 2014-08-19 MED ORDER — ATORVASTATIN CALCIUM 40 MG PO TABS: 40.0000 mg | ORAL_TABLET | Freq: Every day | ORAL | Status: DC

## 2014-08-19 MED ORDER — POTASSIUM CHLORIDE CRYS ER 20 MEQ PO TBCR: 20.0000 meq | EXTENDED_RELEASE_TABLET | Freq: Every day | ORAL | Status: DC

## 2014-08-19 MED ORDER — ESOMEPRAZOLE MAGNESIUM 20 MG PO CPDR: 20.0000 mg | DELAYED_RELEASE_CAPSULE | Freq: Every day | ORAL | Status: DC

## 2014-08-19 NOTE — Progress Notes (Signed)
Patient ID: [name], female   DOB: [birth year], 58 y.o.   MRN: [id number]   [facility] Bernadene Bell, MD Phone: [phone]  Subjective:  [name] is a 58 y.o F who presents with discomfort in her lower shoulder here for f/up  # Pain -much improved after use of flexeril -has been able to go back to 6lbs  -s/p right shoulder arthroscopy in oct 2014- with excision of clavicle and rotator cuff repair  #Needs med refills -DB foot exam performed  -has had mm cramps lately, not on potassium  All relevant systems were reviewed and were negative unless otherwise noted in the HPI  Past Medical History Reviewed problem list.  Medications- reviewed and updated Current Outpatient Prescriptions  Medication Sig Dispense Refill  . atorvastatin (LIPITOR) 40 MG tablet Take 1 tablet (40 mg total) by mouth daily.  90 tablet  3  . B Complex Vitamins (VITAMIN-B COMPLEX) TABS 1 tablet.      . calcium carbonate (OS-CAL) 600 MG TABS tablet Take 1 tablet (600 mg total) by mouth 2 (two) times daily with a meal.  180 tablet  1  . cyclobenzaprine (FLEXERIL) 10 MG tablet Take 1 tablet (10 mg total) by mouth 3 (three) times daily as needed for muscle spasms.  30 tablet  0  . docusate sodium (COLACE) 100 MG capsule Take 1 capsule (100 mg total) by mouth 2 (two) times daily as needed. For constipation  180 capsule  1  . esomeprazole (NEXIUM) 40 MG capsule Take 1 capsule (40 mg total) by mouth daily before breakfast.  90 capsule  1  . ferrous sulfate 220 (44 FE) MG/5ML solution TAKE 6.71ml DAILY 30 MINUTES BEFORE BREAKFAST MIX WITH 1/2 GLASS OF ORANGE JUICE  240 mL  3  . fluticasone (FLONASE) 50 MCG/ACT nasal spray Place 2 sprays into both nostrils at bedtime.  16 g  6  . lisinopril-hydrochlorothiazide (PRINZIDE,ZESTORETIC) 20-12.5 MG per tablet Take 2 tablets by mouth daily.  180 tablet  3  . loratadine (CLARITIN) 10 MG tablet Take 1 tablet (10 mg total) by mouth daily.  30 tablet  11    . Multiple Vitamin (MULTIVITAMIN) tablet Take 1 tablet by mouth daily.  90 tablet  1  . polyethylene glycol powder (GLYCOLAX/MIRALAX) powder Take 17 g by mouth 2 (two) times daily.  850 g  1  . potassium chloride SA (K-DUR,KLOR-CON) 20 MEQ tablet Take 1 tablet (20 mEq total) by mouth daily.  30 tablet  0  . ursodiol (ACTIGALL) 300 MG capsule Take 1 capsule (300 mg total) by mouth 2 (two) times daily.  180 capsule  1  . vitamin B-12 (CYANOCOBALAMIN) 100 MCG tablet Take 100 mcg by mouth daily.      . vitamin C (ASCORBIC ACID) 250 MG tablet 250 mg.      . zolpidem (AMBIEN) 10 MG tablet Take 1 tablet (10 mg total) by mouth at bedtime as needed for sleep.  30 tablet  2   No current facility-administered medications for this visit.   Chief complaint-noted No additions to family history Social history- patient is a never smoker  Objective: BP 140/90  Pulse 93  Temp(Src) 98.5 F (36.9 C) (Oral)  Ht 5\' 4"  (1.626 m)  Wt 197 lb 8 oz (89.585 kg)  BMI 33.88 kg/m2 Gen: NAD, alert, cooperative with exam HEENT: NCAT, EOMI Neck: FROM, supple Ext: No edema, warm, normal tone, shoulder normal by inspection; no palpable step offs; moves UE/LE  spontaneously  Assessment/Plan: See problem based a/p

## 2014-08-19 NOTE — Addendum Note (Signed)
Addended by: Langston Masker C on: 08/19/2014 03:49 PM   Modules accepted: Orders

## 2014-08-19 NOTE — Assessment & Plan Note (Signed)
Cont current therapy Refill ace-thiazide CMET Cont K supplement as needed

## 2014-08-19 NOTE — Patient Instructions (Signed)
Ms Rezabek it was great to see you today!  I am pleased to hear that things are going well for you and that your shoulder is getting better  I will give you a call if any of the results look abnormal Please keep taking two blood pressure pills.  Looking forward to seeing you soon Bernadene Bell, MD

## 2014-08-19 NOTE — Assessment & Plan Note (Signed)
Improved on flexeril PRN med going forward Will hold on further testing for now

## 2014-08-20 ENCOUNTER — Encounter: Payer: Self-pay | Admitting: Family Medicine

## 2014-08-21 ENCOUNTER — Telehealth: Payer: Self-pay | Admitting: Family Medicine

## 2014-08-21 NOTE — Telephone Encounter (Signed)
Pt called and said that they doctor needed to call [facility] and ask for the [facility] department and explain why the patient needs a another tetanus shot. Please call [phone]. jw

## 2014-08-22 ENCOUNTER — Encounter: Payer: Self-pay | Admitting: Family Medicine

## 2014-08-22 NOTE — Telephone Encounter (Signed)
Attempted to call patient again, the number on file is incorrect, no other numbers on file to attempt to contact patient, will send letter.

## 2014-08-22 NOTE — Telephone Encounter (Signed)
Attempted to call patient back to clarify what she needs from physician.

## 2014-09-09 ENCOUNTER — Telehealth: Payer: Self-pay | Admitting: Family Medicine

## 2014-09-09 NOTE — Telephone Encounter (Signed)
Pt called and needs a letter to her [facility] She is still having some bleeding and her trainer would prefer her not attend these sessions until this has been resolved. She would also be able to get a refund for the next months fees since she would not be able to attend. Please leave letter upfront and let patient know so that she can pick this up. jw

## 2014-09-09 NOTE — Telephone Encounter (Signed)
Attempted to call patient, busy signal, will attempt to call again tomorrow to clarify request

## 2014-09-10 NOTE — Telephone Encounter (Signed)
Attempted to call patient, no answer, left message that I will attempt to contact again tomorrow.

## 2014-09-10 NOTE — Telephone Encounter (Signed)
Pt returned call. The phone number is correct

## 2014-09-11 ENCOUNTER — Encounter: Payer: Self-pay | Admitting: Family Medicine

## 2014-09-11 NOTE — Telephone Encounter (Signed)
Spoke to patient at 8:30 am on 09/11/14. 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 Letter written and placed in front office for pickup. Informed patient that she should be seen in office if she is having rectal bleeding, she has appointment in December to see me, she is not concerned about the bleeding and declined appointment at this time.

## 2014-09-11 NOTE — Telephone Encounter (Signed)
Spoke to patient, she states that [facility] will cover tetanus if physician office calls [facility], will forward this message to nursing staff to complete prior auth over the phone, phone number to call is [phone]

## 2014-09-17 NOTE — Telephone Encounter (Signed)
[facility] today regarding cover a tetanus vaccine.  Per representative; a PA is not required.  Case number [id number].  Derl Barrow, RN

## 2014-09-18 ENCOUNTER — Telehealth: Payer: Self-pay | Admitting: Family Medicine

## 2014-09-18 NOTE — Telephone Encounter (Signed)
Pt called and would like a copy of her last PPD test left up front for pick up. jw

## 2014-09-18 NOTE — Telephone Encounter (Signed)
LMOVM for pt to call us back. Rahma Meller CMA

## 2014-09-19 NOTE — Telephone Encounter (Signed)
Per pt job they only require it every 3 yrs. Letter printed off and placed upfront. Asjia Berrios CMA

## 2014-10-06 ENCOUNTER — Other Ambulatory Visit: Payer: Self-pay | Admitting: Family Medicine

## 2014-10-07 ENCOUNTER — Ambulatory Visit (INDEPENDENT_AMBULATORY_CARE_PROVIDER_SITE_OTHER): Payer: Commercial Managed Care - HMO | Admitting: Family Medicine

## 2014-10-07 ENCOUNTER — Encounter: Payer: Self-pay | Admitting: Family Medicine

## 2014-10-07 VITALS — BP 157/93 | HR 82 | Temp 98.2°F | Ht 64.0 in | Wt 194.6 lb

## 2014-10-07 DIAGNOSIS — K921 Melena: Secondary | ICD-10-CM | POA: Insufficient documentation

## 2014-10-07 DIAGNOSIS — R531 Weakness: Secondary | ICD-10-CM

## 2014-10-07 HISTORY — DX: Melena: K92.1

## 2014-10-07 LAB — POCT HEMOGLOBIN: Hemoglobin: 10.6 g/dL — AB (ref 12.2–16.2)

## 2014-10-07 NOTE — Patient Instructions (Signed)
Rectal Bleeding - I think that your rectal bleeding is due to your diverticulosis. Your hemoglobin is lower than when checked in march of this year. Please make an appointment with your GI physician for a possible colonoscopy.

## 2014-10-07 NOTE — Assessment & Plan Note (Signed)
POC hemoccult positive for blood. No external or internal hemorrrhoids appreciated on exam. Suspect diverticular bleed. Hemoglobin decreased but in acceptable range. -Patient to return to GI physician for possible colonoscopy -continue Colace and Miralax -continue daily iron supplementation

## 2014-10-07 NOTE — Progress Notes (Signed)
   Subjective:    Patient ID: [name], female    DOB: January 14, 1956, 58 y.o.   MRN: [id number]  HPI 58 y/o female presents for evaluation of rectal bleeding.  She has a history of diverticular bleeding and severe diverticulosis. Patient reports blood in her stool starting 2 days ago, also had rectal bleeding a few weeks ago that resolved after a few days, reports regular BM's, takes Colace and Miralax daily, no N/V. No current abdominal pain. No rectal pain.    Review of Systems  Constitutional: Negative for fever, chills and fatigue.  Respiratory: Negative for shortness of breath.   Cardiovascular: Negative for chest pain.  Gastrointestinal: Positive for constipation and blood in stool. Negative for nausea and diarrhea.       Objective:   Physical Exam  Gen: pleasant AAF, NAD HEENT: normocephalic, PERRL, EOMI, conjunctival pallor present Cardiac: RRR, S1 and S2 present, no murmurs, no heaves/thrills Resp: CTAB, normal effort Abd: soft, mild bilateral lower quadrant tenderness, normal bowel sounds Rectal - no internal or external hemorrhoids, hemeoccult positive, no gross blood   POC HBG 10.6 (12.7 in 12/2013) Colonoscopy 05/2013 - diverticulosis and benign polyp     Assessment & Plan:  Please see problem specific assessment and plan.

## 2014-10-15 ENCOUNTER — Encounter: Payer: Self-pay | Admitting: Physician Assistant

## 2014-10-15 ENCOUNTER — Other Ambulatory Visit (INDEPENDENT_AMBULATORY_CARE_PROVIDER_SITE_OTHER): Payer: Commercial Managed Care - HMO

## 2014-10-15 ENCOUNTER — Ambulatory Visit (INDEPENDENT_AMBULATORY_CARE_PROVIDER_SITE_OTHER): Payer: Commercial Managed Care - HMO | Admitting: Physician Assistant

## 2014-10-15 VITALS — BP 136/76 | HR 88 | Ht 64.0 in | Wt 196.5 lb

## 2014-10-15 DIAGNOSIS — K625 Hemorrhage of anus and rectum: Secondary | ICD-10-CM

## 2014-10-15 DIAGNOSIS — Z8719 Personal history of other diseases of the digestive system: Secondary | ICD-10-CM

## 2014-10-15 LAB — CBC WITH DIFFERENTIAL/PLATELET
Basophils Absolute: 0 10*3/uL (ref 0.0–0.1)
Basophils Relative: 0.5 % (ref 0.0–3.0)
Eosinophils Absolute: 0.3 10*3/uL (ref 0.0–0.7)
Eosinophils Relative: 4.8 % (ref 0.0–5.0)
HCT: 33.5 % — ABNORMAL LOW (ref 36.0–46.0)
Hemoglobin: 10.6 g/dL — ABNORMAL LOW (ref 12.0–15.0)
Lymphocytes Relative: 33.8 % (ref 12.0–46.0)
Lymphs Abs: 1.9 10*3/uL (ref 0.7–4.0)
MCHC: 31.8 g/dL (ref 30.0–36.0)
MCV: 93.5 fl (ref 78.0–100.0)
Monocytes Absolute: 0.5 10*3/uL (ref 0.1–1.0)
Monocytes Relative: 8.4 % (ref 3.0–12.0)
Neutro Abs: 3 10*3/uL (ref 1.4–7.7)
Neutrophils Relative %: 52.5 % (ref 43.0–77.0)
Platelets: 302 10*3/uL (ref 150.0–400.0)
RBC: 3.58 Mil/uL — ABNORMAL LOW (ref 3.87–5.11)
RDW: 14.2 % (ref 11.5–15.5)
WBC: 5.7 10*3/uL (ref 4.0–10.5)

## 2014-10-15 MED ORDER — NA SULFATE-K SULFATE-MG SULF 17.5-3.13-1.6 GM/177ML PO SOLN
1.0000 | Freq: Once | ORAL | Status: DC
Start: 2014-10-15 — End: 2014-11-08

## 2014-10-15 NOTE — Patient Instructions (Signed)
Stay off aspirin for now. You have been scheduled for a colonoscopy. Please follow written instructions given to you at your visit today.  We have given you a free colonoscopy prep- Suprep. If you use inhalers (even only as needed), please bring them with you on the day of your procedure. Your physician has requested that you go to www.startemmi.com and enter the access code given to you at your visit today. This web site gives a general overview about your procedure. However, you should still follow specific instructions given to you by our office regarding your preparation for the procedure.

## 2014-10-15 NOTE — Progress Notes (Signed)
I'm not sure patient needs a repeat colonoscopy inasmuch as she had  One 15 months ago.  She really having persistent right lower quadrant pain?  May consider anticholinergics and reevaluation in 4-6 weeks.

## 2014-10-15 NOTE — Progress Notes (Signed)
Patient ID: [name], female   DOB: [birth year], 58 y.o.   MRN: [id number]   Subjective:    Patient ID: [name], female    DOB: 01-Jan-1956, 58 y.o.   MRN: [id number]  HPI Janae Bridgeman is a very nice 58 year old African-American female known to Dr. Deatra Ina. She is referred today by Dr. Ree Kida . Patient has history of recurrent lower GI bleeding. She is status post gastric bypass done in July 2013. She had colonoscopy with Dr. Deatra Ina in July 2014 found to have one sessile polyp in the sigmoid colon biopsy consistent with an inflammatory polyp. Also had severe diverticulosis universally. Her bleeding has been felt to be secondary to diverticular disease. Patient says that she had an episode of frank blood per rectum and rectum in mid November ill in the blood with commode. She said she just had one episode and then the bleeding stopped. 2 weeks later she had a smaller bleed with bright red blood mixed in with her bowel movements for a couple of days. She has since seen her PCP and had labs done on 10/07/2014 showing hemoglobin of 10.6 prior hemoglobin was 12.7 in March 2015. She says she has not had any recurrent bleeding since in her stools looked normal. She has no associated abdominal pain cramping discomfort etc. she says she feels fine otherwise. She does take a baby aspirin daily or NSAIDs. Family history is positive for colon cancer in her  Father.  Review of Systems Pertinent positive and negative review of systems were noted in the above HPI section.  All other review of systems was otherwise negative.  Outpatient Encounter Prescriptions as of 10/15/2014  Medication Sig  . aspirin EC 81 MG tablet Take 81 mg by mouth.  Marland Kitchen atorvastatin (LIPITOR) 40 MG tablet Take 1 tablet (40 mg total) by mouth daily.  . B Complex Vitamins (VITAMIN-B COMPLEX) TABS 1 tablet.  . calcium carbonate (OS-CAL) 600 MG TABS tablet Take 1 tablet (600 mg total) by mouth 2 (two) times daily with a meal.  . calcium-vitamin  D ([employer] OYSTER SHELL CALCIUM-VIT D) 500-200 MG-UNIT per tablet Take 1 tablet by mouth.  . cyclobenzaprine (FLEXERIL) 10 MG tablet Take 1 tablet (10 mg total) by mouth 3 (three) times daily as needed for muscle spasms.  Mariane Baumgarten Calcium (STOOL SOFTENER PO)   . docusate sodium (COLACE) 100 MG capsule Take 1 capsule (100 mg total) by mouth 2 (two) times daily as needed. For constipation  . esomeprazole (NEXIUM) 40 MG capsule Take 40 mg by mouth.  . ferrous sulfate (SM IRON) 325 (65 FE) MG tablet Take 325 mg by mouth.  . fexofenadine (ALLEGRA) 180 MG tablet Take 180 mg by mouth.  . fluticasone (FLONASE) 50 MCG/ACT nasal spray Place 2 sprays into both nostrils at bedtime.  Marland Kitchen lisinopril-hydrochlorothiazide (PRINZIDE,ZESTORETIC) 20-12.5 MG per tablet Take 2 tablets by mouth daily.  Marland Kitchen loratadine (CLARITIN) 10 MG tablet Take 1 tablet (10 mg total) by mouth daily.  . Multiple Vitamin (MULTIVITAMIN) tablet Take 1 tablet by mouth daily.  . nitroGLYCERIN (NITROSTAT) 0.4 MG SL tablet Place 0.4 mg under the tongue.  . polyethylene glycol powder (GLYCOLAX/MIRALAX) powder Take 17 g by mouth 2 (two) times daily.  . potassium chloride SA (K-DUR,KLOR-CON) 20 MEQ tablet Take 1 tablet (20 mEq total) by mouth daily.  . ursodiol (ACTIGALL) 300 MG capsule Take 1 capsule (300 mg total) by mouth 2 (two) times daily.  . vitamin B-12 (CYANOCOBALAMIN) 100 MCG tablet Take 100  mcg by mouth daily.  . vitamin C (ASCORBIC ACID) 250 MG tablet 250 mg.  . zolpidem (AMBIEN) 10 MG tablet Take 1 tablet (10 mg total) by mouth at bedtime as needed for sleep.  . [DISCONTINUED] esomeprazole (NEXIUM) 20 MG capsule Take 1 capsule (20 mg total) by mouth daily before breakfast.  . [DISCONTINUED] ferrous sulfate 220 (44 FE) MG/5ML solution TAKE 6.69m DAILY 30 MINUTES BEFORE BREAKFAST -MIX WITH 1/2 GLASS OF ORANGE JUICE  . Na Sulfate-K Sulfate-Mg Sulf SOLN Take 1 kit by mouth once.   Allergies  Allergen Reactions  . Aspirin Other (See  Comments)    GI BLEED  . Nsaids Other (See Comments)    GI BLEED   Patient Active Problem List   Diagnosis Date Noted  . Blood in stool 10/07/2014  . Pain in joint, shoulder region 07/25/2014  . Muscle cramps 04/22/2014  . Biliary sludge 04/08/2014  . Bunion 03/28/2014  . Foot pain, bilateral 01/11/2014  . Constipation 10/19/2013  . Subacromial bursitis 05/21/2013  . Eczema 05/21/2013  . External hemorrhoids without complication 055/73/2202 . Insomnia 08/29/2012  . GERD (gastroesophageal reflux disease) 08/24/2012  . S/P gastric bypass 06/07/2012  . Asthma, cough variant 03/10/2012  . Diverticulosis of colon with hemorrhage   . Hypopigmented skin lesion 02/23/2011  . 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 ALLERGIC RHINITIS CAUSE UNSPECIFIED 05/27/2010  . POSTMENOPAUSAL STATUS 04/07/2010  . LIBIDO, DECREASED 10/16/2009  . H/O type 2 diabetes mellitus 01/25/2008  . CYST, KIDNEY, ACQUIRED 08/15/2007  . CEREBROVASCULAR ACCIDENT, HX OF 07/26/2007  . HTN, goal below 140/90 05/12/2007  . OBESITY, NOS 12/29/2006  . ANEMIA, IRON DEFICIENCY, UNSPEC. 12/29/2006   History   Social History  . Marital Status: Divorced    Spouse Name: N/A    Number of Children: N/A  . Years of Education: N/A   Occupational History  . Not on file.   Social History Main Topics  . Smoking status: Never Smoker   . Smokeless tobacco: Never Used  . Alcohol Use: Yes     Comment: occasional wine  . Drug Use: No  . Sexual Activity: Not on file   Other Topics Concern  . Not on file   Social History Narrative    Ms. Ellerby's family history includes Cancer (age of onset: 668 in her father; Diabetes in her brother, brother, brother, brother, mother, sister, and sister; Heart disease in her sister; Hypertension in her mother; Stroke in her brother and sister.      Objective:    Filed Vitals:   10/15/14 0841  BP: 136/76  Pulse: 88    Physical Exam  well-developed African-American female in  no acute distress, pleasant height 5 foot 4 weight 196. HEENT; nontraumatic normocephalic EOMI PERRLA sclera anicteric, Supple; no JVD, Cardiovascular; regular rate and rhythm with S1-S2 no murmur or gallop, Pulmonary; clear bilaterally, Abdomen ;soft  she is tender in the right lower quadrant,nondistended bowel sounds are active she has midline incisional scar and scar from abdominoplasty, Rectal ;exam no external lesions stool brown and Hemoccult positive, Extremities ;no clubbing cyanosis or edema, Psych ;mood and affect appropriate     Assessment & Plan:   [age]year old female with history of diverticular bleeding, and universal diverticulosis. Patient has had 2 recent episodes of frank hematochezia and a drop in her hemoglobin. Most likely she has had recurrent diverticular bleeding. However am concerned because she also is complaining of some right lower quadrant discomfort recently and has family history of  colon cancer. Rule out colon neoplasm  #2 history of CVA #3 status post gastric bypass 2013 #4 hypertension #5 diabetes mellitus  Plan; repeat CBC today Hold baby aspirin she has been taking this prophylactically Schedule for colonoscopy with Dr. Deatra Ina. Procedure discussed in detail with patient and she is agreeable to proceed. She is advised that she should call for advised if she has any further episodes of frank bleeding.    Amy S Esterwood PA-C 10/15/2014

## 2014-10-29 ENCOUNTER — Ambulatory Visit (INDEPENDENT_AMBULATORY_CARE_PROVIDER_SITE_OTHER): Payer: Commercial Managed Care - HMO

## 2014-10-29 VITALS — BP 118/82 | HR 67 | Resp 18

## 2014-10-29 DIAGNOSIS — M722 Plantar fascial fibromatosis: Secondary | ICD-10-CM

## 2014-10-29 DIAGNOSIS — M79672 Pain in left foot: Secondary | ICD-10-CM

## 2014-10-29 DIAGNOSIS — M79671 Pain in right foot: Secondary | ICD-10-CM

## 2014-10-29 DIAGNOSIS — M7731 Calcaneal spur, right foot: Secondary | ICD-10-CM

## 2014-10-29 MED ORDER — TRIAMCINOLONE ACETONIDE 10 MG/ML IJ SUSP: 10.0000 mg | Freq: Once | INTRAMUSCULAR | Status: DC

## 2014-10-29 MED ORDER — HYDROCODONE-ACETAMINOPHEN 5-325 MG PO TABS: 1.0000 | ORAL_TABLET | Freq: Four times a day (QID) | ORAL | Status: DC | PRN

## 2014-10-29 NOTE — Patient Instructions (Signed)
ICE INSTRUCTIONS  Apply ice or cold pack to the affected area at least 3 times a day for 10-15 minutes each time.  You should also use ice after prolonged activity or vigorous exercise.  Do not apply ice longer than 20 minutes at one time.  Always keep a cloth between your skin and the ice pack to prevent burns.  Being consistent and following these instructions will help control your symptoms.  We suggest you purchase a gel ice pack because they are reusable and do bit leak.  Some of them are designed to wrap around the area.  Use the method that works best for you.  Here are some other suggestions for icing.   Use a frozen bag of peas or corn-inexpensive and molds well to your body, usually stays frozen for 10 to 20 minutes.  Wet a towel with cold water and squeeze out the excess until it's damp.  Place in a bag in the freezer for 20 minutes. Then remove and use.  Maintain shoes and arch supports at all times. No barefoot no flimsy shoes no flip-flops.  Recommended Tylenol as needed for pain, use the hydrocodone and Tylenol for any breakthrough pain

## 2014-10-29 NOTE — Progress Notes (Signed)
   Subjective:    Patient ID: [name], female    DOB: June 26, 1956, 58 y.o.   MRN: [id number]  HPI MY RIGHT HEEL IS KILLING ME AND IT HURTS WHEN I GET READY TO GO TO BED AND IT IS PULLING AND THROBS AND BURNS    Review of Systems no new findings or systemic changes noted     Objective:   Physical Exam 58 year old [place] American female presents this time with her husband has about 3-4 week history of increasing pain right inferior heel pain on first up in the morning or getting a fall. Of rest no history of injury trauma noted currently wearing a pair of shoes that are slightly worn and she has systems diabetic insoles in her shoes however there on the wrong side is left in the right foot and the right orthotic in left foot this is corrected for her and pointed out. Patient is developed some recalcitrant plantar fasciitis/heel spur syndrome on the right pain on palpation of medial been plantar fascial medial calcaneal tubercle previous x-rays reveal well-developed spurring and arthrosis of the forefoot midfoot and arch with significant promontory changes noted clinically and radiographically. There is pain on palpation along the medial band of plantar fascia right heel left foot is asymptomatic. No open wounds no ulcers no secondary infections neurovascular status otherwise intact pedal pulses are palpable DP and PT +2 PT plus one over 4 bilateral Refill time 3 seconds all digits epicritic sensations intact although decreased on Sims Weinstein the forefoot has a history of diabetes with complications also history of rectal bleeding cannot take aspirin products or NSAIDs.       Assessment & Plan:  Assessment plantar fasciitis/heel spur syndrome right heel injection tendons Kenalog 20 and was PLAIN to the right heel fascial strapping applied or prescription for hydrocodone is given at this time also recommended ice to the affected area recheck in 2-3 weeks for follow-up no barefoot no flimsy  shoes or flip-flops maintain shoes and arch supports at all times  Harriet Masson DPM

## 2014-10-30 ENCOUNTER — Telehealth: Payer: Self-pay | Admitting: Family Medicine

## 2014-11-07 ENCOUNTER — Other Ambulatory Visit: Payer: Self-pay | Admitting: Family Medicine

## 2014-11-08 ENCOUNTER — Encounter: Payer: Self-pay | Admitting: Gastroenterology

## 2014-11-08 ENCOUNTER — Ambulatory Visit (AMBULATORY_SURGERY_CENTER): Payer: Commercial Managed Care - HMO | Admitting: Gastroenterology

## 2014-11-08 VITALS — BP 140/101 | HR 74 | Temp 96.9°F | Resp 17 | Ht 64.0 in | Wt 196.0 lb

## 2014-11-08 DIAGNOSIS — E119 Type 2 diabetes mellitus without complications: Secondary | ICD-10-CM | POA: Diagnosis not present

## 2014-11-08 DIAGNOSIS — K625 Hemorrhage of anus and rectum: Secondary | ICD-10-CM

## 2014-11-08 DIAGNOSIS — K5731 Diverticulosis of large intestine without perforation or abscess with bleeding: Secondary | ICD-10-CM

## 2014-11-08 DIAGNOSIS — E669 Obesity, unspecified: Secondary | ICD-10-CM | POA: Diagnosis not present

## 2014-11-08 DIAGNOSIS — I10 Essential (primary) hypertension: Secondary | ICD-10-CM | POA: Diagnosis not present

## 2014-11-08 DIAGNOSIS — D649 Anemia, unspecified: Secondary | ICD-10-CM | POA: Diagnosis not present

## 2014-11-08 MED ORDER — SODIUM CHLORIDE 0.9 % IV SOLN: 500.0000 mL | INTRAVENOUS | Status: DC

## 2014-11-08 NOTE — Op Note (Signed)
[facility]  [address] [place], [id number]   COLONOSCOPY PROCEDURE REPORT  PATIENT: Hannah, [name]  MR#: [id number] BIRTHDATE: Jul 15, 1956 , 58  yrs. old GENDER: female ENDOSCOPIST: Inda Castle, MD REFERRED BY: PROCEDURE DATE:  11/08/2014 PROCEDURE:   Colonoscopy, diagnostic First Screening Colonoscopy - Avg.  risk and is 50 yrs.  old or older - No.  Prior Negative Screening - Now for repeat screening. Other: See Comments  History of Adenoma - Now for follow-up colonoscopy & has been > or = to 3 yrs.  N/A  Polyps Removed Today? No.  Recommend repeat exam, <10 yrs? No. ASA CLASS:   Class II INDICATIONS:hematochezia. MEDICATIONS: Monitored anesthesia care and Propofol 250 mg IV  DESCRIPTION OF PROCEDURE:   After the risks benefits and alternatives of the procedure were thoroughly explained, informed consent was obtained.  The digital rectal exam revealed no abnormalities of the rectum.   The LB [id number]  endoscope was introduced through the anus and advanced to the cecum, which was identified by both the appendix and ileocecal valve. No adverse events experienced.   The quality of the prep was Suprep good  The instrument was then slowly withdrawn as the colon was fully examined.      COLON FINDINGS: There was moderate diverticulosis noted in the ascending colon, at the cecum, in the transverse colon, sigmoid colon, and descending colon.   The examination was otherwise normal.  Retroflexed views revealed no abnormalities. The time to cecum=4 minutes 55 seconds.  Withdrawal time=7 minutes 30 seconds. The scope was withdrawn and the procedure completed. COMPLICATIONS: There were no immediate complications.  ENDOSCOPIC IMPRESSION: 1.   There was moderate diverticulosis noted in the ascending colon, at the cecum, in the transverse colon, sigmoid colon, and descending colon 2.   The examination was otherwise normal  Rectal bleeding secondary to  diverticula  RECOMMENDATIONS: Colonoscopy 10 years  eSigned:  Inda Castle, MD 11/08/2014 3:25 PM   cc: Dossie Arbour, MD   PATIENT NAME:  Hannah, [name] MR#: [id number]

## 2014-11-08 NOTE — Patient Instructions (Signed)
YOU HAD AN ENDOSCOPIC PROCEDURE TODAY AT THE [facility]: Refer to the procedure report that was given to you for any specific questions about what was found during the examination.  If the procedure report does not answer your questions, please call your gastroenterologist to clarify.  If you requested that your care partner not be given the details of your procedure findings, then the procedure report has been included in a sealed envelope for you to review at your convenience later.  YOU SHOULD EXPECT: Some feelings of bloating in the abdomen. Passage of more gas than usual.  Walking can help get rid of the air that was put into your GI tract during the procedure and reduce the bloating. If you had a lower endoscopy (such as a colonoscopy or flexible sigmoidoscopy) you may notice spotting of blood in your stool or on the toilet paper. If you underwent a bowel prep for your procedure, then you may not have a normal bowel movement for a few days.  DIET: Your first meal following the procedure should be a light meal and then it is ok to progress to your normal diet.  A half-sandwich or bowl of soup is an example of a good first meal.  Heavy or fried foods are harder to digest and may make you feel nauseous or bloated.  Likewise meals heavy in dairy and vegetables can cause extra gas to form and this can also increase the bloating.  Drink plenty of fluids but you should avoid alcoholic beverages for 24 hours.  ACTIVITY: Your care partner should take you home directly after the procedure.  You should plan to take it easy, moving slowly for the rest of the day.  You can resume normal activity the day after the procedure however you should NOT DRIVE or use heavy machinery for 24 hours (because of the sedation medicines used during the test).    SYMPTOMS TO REPORT IMMEDIATELY: A gastroenterologist can be reached at any hour.  During normal business hours, 8:30 AM to 5:00 PM Monday through Friday,  call [phone].  After hours and on weekends, please call the GI answering service at [phone] who will take a message and have the physician on call contact you.   Following lower endoscopy (colonoscopy or flexible sigmoidoscopy):  Excessive amounts of blood in the stool  Significant tenderness or worsening of abdominal pains  Swelling of the abdomen that is new, acute  Fever of 100F or higher  FOLLOW UP: If any biopsies were taken you will be contacted by phone or by letter within the next 1-3 weeks.  Call your gastroenterologist if you have not heard about the biopsies in 3 weeks.  Our staff will call the home number listed on your records the next business day following your procedure to check on you and address any questions or concerns that you may have at that time regarding the information given to you following your procedure. This is a courtesy call and so if there is no answer at the home number and we have not heard from you through the emergency physician on call, we will assume that you have returned to your regular daily activities without incident.  SIGNATURES/CONFIDENTIALITY: You and/or your care partner have signed paperwork which will be entered into your electronic medical record.  These signatures attest to the fact that that the information above on your After Visit Summary has been reviewed and is understood.  Full responsibility of the confidentiality of this   discharge information lies with you and/or your care-partner.  Recommendations Next colonoscopy in 10 years. Diverticulosis and high fiber handouts provided to patient/care partner.

## 2014-11-08 NOTE — Progress Notes (Signed)
Pt stable to RR 

## 2014-11-11 ENCOUNTER — Telehealth: Payer: Self-pay

## 2014-11-11 NOTE — Telephone Encounter (Signed)
  Follow up Call-  Call back number 11/08/2014 05/08/2013  Post procedure Call Back phone  # [phone]  Permission to leave phone message Yes Yes     Patient questions:  Do you have a fever, pain , or abdominal swelling? No. Pain Score  0 *  Have you tolerated food without any problems? Yes.    Have you been able to return to your normal activities? Yes.    Do you have any questions about your discharge instructions: Diet   No. Medications  No. Follow up visit  No.  Do you have questions or concerns about your Care? No.  Actions: * If pain score is 4 or above: No action needed, pain <4.

## 2014-11-12 ENCOUNTER — Telehealth: Payer: Self-pay | Admitting: *Deleted

## 2014-11-12 ENCOUNTER — Encounter: Payer: Self-pay | Admitting: Family Medicine

## 2014-11-12 ENCOUNTER — Ambulatory Visit (INDEPENDENT_AMBULATORY_CARE_PROVIDER_SITE_OTHER): Payer: Commercial Managed Care - HMO | Admitting: Family Medicine

## 2014-11-12 VITALS — BP 142/95 | HR 74 | Temp 98.0°F | Wt 192.0 lb

## 2014-11-12 DIAGNOSIS — J069 Acute upper respiratory infection, unspecified: Secondary | ICD-10-CM

## 2014-11-12 DIAGNOSIS — B9789 Other viral agents as the cause of diseases classified elsewhere: Principal | ICD-10-CM

## 2014-11-12 MED ORDER — MOMETASONE FUROATE 50 MCG/ACT NA SUSP: 2.0000 | Freq: Every day | NASAL | Status: DC

## 2014-11-12 MED ORDER — PHENYLEPH-PROMETHAZINE-COD 5-6.25-10 MG/5ML PO SYRP: 5.0000 mL | ORAL_SOLUTION | Freq: Every evening | ORAL | Status: DC | PRN

## 2014-11-12 NOTE — Telephone Encounter (Signed)
[facility] pharmacist with [facility] called stating the Rx for Phenyleph-Promethazine-Cod is not available and very expensive.  Please consider changing medication to Promethazine-Cod with same dosage.  Please call [phone].  Derl Barrow, RN

## 2014-11-12 NOTE — Progress Notes (Signed)
  Subjective:     [name] is a 59 y.o. female who presents for evaluation of sore throat. Associated symptoms include nasal blockage, post nasal drip, productive cough, sinus and nasal congestion and sore throat. Onset of symptoms was 2 days ago, and have been unchanged since that time. She is drinking plenty of fluids. She has not had a recent close exposure to someone with proven streptococcal pharyngitis.  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  Denies any travel, fever, chills, sweats, wheezing, smoking hx or hx of asthma   The following portions of the patient's history were reviewed and updated as appropriate: allergies, current medications, past family history, past medical history, past social history, past surgical history and problem list.  Review of Systems Pertinent items are noted in HPI.    Objective:    BP 142/95 mmHg  Pulse 74  Temp(Src) 98 F (36.7 C) (Oral)  Wt 192 lb (87.091 kg) General appearance: alert Head: Normocephalic, without obvious abnormality, atraumatic Eyes: conjunctivae/corneas clear. PERRL, EOM's intact. Fundi benign. Ears: normal TM's and external ear canals both ears Nose: turbinates red, swollen Throat: lips, mucosa, and tongue normal; teeth and gums normal and O/P cler Neck: no adenopathy Lungs: clear to auscultation bilaterally    Assessment:    URI, viral    Plan:    Mucinex 600 mg BID, Nasal Saline, Nasonex 2 sprays QD each nostril, phenergan w/ codeine PRN at night for cough

## 2014-11-12 NOTE — Patient Instructions (Signed)
Please try ibuprofen 400 mg every 6-8 hours.  Please use nasonex as directed.  Mucinex 600 mg two times per day. 1-2 tsp of phenergan w/ codeine at night as neeeded for cough.  Thanks, Dr. Awanda Mink

## 2014-11-13 NOTE — Telephone Encounter (Signed)
Discussed with pharmacist, resolved issue.  Thanks [employer]. Awanda Mink, DO of [facility] 11/13/2014, 3:15 PM

## 2014-11-18 ENCOUNTER — Encounter: Payer: Self-pay | Admitting: Family Medicine

## 2014-11-18 DIAGNOSIS — K5730 Diverticulosis of large intestine without perforation or abscess without bleeding: Secondary | ICD-10-CM

## 2014-11-18 HISTORY — DX: Diverticulosis of large intestine without perforation or abscess without bleeding: K57.30

## 2014-11-25 ENCOUNTER — Ambulatory Visit: Payer: Commercial Managed Care - HMO | Admitting: Family Medicine

## 2014-11-26 ENCOUNTER — Ambulatory Visit (INDEPENDENT_AMBULATORY_CARE_PROVIDER_SITE_OTHER): Payer: Commercial Managed Care - HMO

## 2014-11-26 VITALS — BP 127/82 | HR 82 | Resp 12

## 2014-11-26 DIAGNOSIS — M7731 Calcaneal spur, right foot: Secondary | ICD-10-CM

## 2014-11-26 DIAGNOSIS — M79671 Pain in right foot: Secondary | ICD-10-CM

## 2014-11-26 DIAGNOSIS — M722 Plantar fascial fibromatosis: Secondary | ICD-10-CM

## 2014-11-26 NOTE — Patient Instructions (Signed)

## 2014-11-26 NOTE — Progress Notes (Signed)
   Subjective:    Patient ID: [name], female    DOB: [birth year], 59 y.o.   MRN: [id number]  HPI  ''RT FOOT HEEL IS DOING BETTER BUT STILL LITTLE SORE.''  Review of Systems no new findings or systemic changes noted     Objective:   Physical Exam neurovascular status is unchanged pedal pulses are palpable epicritic and proprioceptive sensations intact patient did have a steroid injection to the inferior right heel provided relief is actually still feeling better than it was although still some pain tenderness on getting up or first up in the morning. 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 Patient is wearing Athletic type shoes which would benefit from orthoses patient states the injection and taping previous visit provided significant improvement well fascial strapping was in place therefore orthotic should be beneficial.      Assessment & Plan:  Assessment plantar fasciitis/heel spur syndrome right plantar this time fascial strapping was beneficial therefore orthoses OTC power step orthotic are dispensed at this time with wearing and break in instructions. Recheck in one to 2 months for follow-up and adjustments as needed may be K for additional steroid injections in the future if needed. Recommended Tylenol as needed for pain written instructions for orthotic use her given at this time. Recheck in 12 months as needed  Harriet Masson DPM

## 2014-12-02 ENCOUNTER — Encounter: Payer: Self-pay | Admitting: Family Medicine

## 2014-12-02 ENCOUNTER — Ambulatory Visit (INDEPENDENT_AMBULATORY_CARE_PROVIDER_SITE_OTHER): Payer: Commercial Managed Care - HMO | Admitting: Family Medicine

## 2014-12-02 DIAGNOSIS — R252 Cramp and spasm: Secondary | ICD-10-CM

## 2014-12-02 LAB — CMP AND LIVER
ALT: 13 U/L (ref 0–35)
AST: 19 U/L (ref 0–37)
Albumin: 4.1 g/dL (ref 3.5–5.2)
Alkaline Phosphatase: 93 U/L (ref 39–117)
BUN: 14 mg/dL (ref 6–23)
Bilirubin, Direct: 0.1 mg/dL (ref 0.0–0.3)
CO2: 27 mEq/L (ref 19–32)
Calcium: 9.3 mg/dL (ref 8.4–10.5)
Chloride: 104 mEq/L (ref 96–112)
Creat: 0.74 mg/dL (ref 0.50–1.10)
Glucose, Bld: 81 mg/dL (ref 70–99)
Indirect Bilirubin: 0.4 mg/dL (ref 0.2–1.2)
Potassium: 3.5 mEq/L (ref 3.5–5.3)
Sodium: 142 mEq/L (ref 135–145)
Total Bilirubin: 0.5 mg/dL (ref 0.2–1.2)
Total Protein: 6.9 g/dL (ref 6.0–8.3)

## 2014-12-02 LAB — MAGNESIUM: Magnesium: 2.2 mg/dL (ref 1.5–2.5)

## 2014-12-02 NOTE — Assessment & Plan Note (Signed)
Patient reports worsening of muscle cramps at night. -check BMP and Mg levels -encouraged patient to continue daily exercise and stretching

## 2014-12-02 NOTE — Patient Instructions (Addendum)
Leg Cramps Leg cramps that occur during exercise can be caused by poor circulation or dehydration. However, muscle cramps that occur at rest or during the night are usually not due to any serious medical problem. Heat cramps may cause muscle spasms during hot weather.  CAUSES There is no clear cause for muscle cramps. However, dehydration may be a factor for those who do not drink enough fluids and those who exercise in the heat. Imbalances in the level of sodium, potassium, calcium or magnesium in the muscle tissue may also be a factor. Some medications, such as water pills (diuretics), may cause loss of chemicals that the body needs (like sodium and potassium) and cause muscle cramps. TREATMENT   Make sure your diet has enough fluids and essential minerals for the muscle to work normally.  Avoid strenuous exercise for several days if you have been having frequent leg cramps.  Stretch and massage the cramped muscle for several minutes.  Some medicines may be helpful in some patients with night cramps. Only take over-the-counter or prescription medicines as directed by your caregiver. SEEK IMMEDIATE MEDICAL CARE IF:   Your leg cramps become worse.  Your foot becomes cold, numb, or blue. Document Released: 11/25/2004 Document Revised: 01/10/2012 Document Reviewed: 11/12/2008 [facility] Patient Information 2015 [place], [place]. This information is not intended to replace advice given to you by your health care provider. Make sure you discuss any questions you have with your health care provider.   Check lab work to evaluate for low potassium, sodium, and magnesium.

## 2014-12-02 NOTE — Progress Notes (Signed)
   Subjective:    Patient ID: [name], female    DOB: 12-23-1955, 59 y.o.   MRN: [id number]  HPI 59 y/o female presents for follow up of leg cramps.   Patient has history of leg cramps, unclear etiology, worse over the past few months, previously on potassium due to low K however stopped when potassium was normal, did not notice symptoms when on potassium supplementation, symptoms occur mostly at night when sleeping, 2-3 times per week, relieved with massage of the affected areas (thights/calves). 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    Review of Systems  Constitutional: Negative for fever, chills and fatigue.  Cardiovascular: Negative for chest pain and leg swelling.  Gastrointestinal: Negative for nausea and diarrhea.       Objective:   Physical Exam Vitals: reviewed Gen: pleasant female, NAD Cardiac: RRR, S1 and S2 present, no murmur, no heaves/thrills Ext: trade edema, no rashes, no current muscle cramping  Reviewed lab work from last 6 months.      Assessment & Plan:  Please see problem specific assessment and plan.

## 2014-12-03 ENCOUNTER — Telehealth: Payer: Self-pay | Admitting: Family Medicine

## 2014-12-03 NOTE — Telephone Encounter (Signed)
Discussed normal lab results. Electrolytes not the cause of leg cramps. Discussed stretching before bed, light exercise before bed, and increasing B-complex vitamins to BID.

## 2014-12-04 DIAGNOSIS — M79673 Pain in unspecified foot: Secondary | ICD-10-CM

## 2014-12-26 ENCOUNTER — Other Ambulatory Visit: Payer: Self-pay | Admitting: Family Medicine

## 2014-12-26 DIAGNOSIS — Z9884 Bariatric surgery status: Secondary | ICD-10-CM | POA: Diagnosis not present

## 2014-12-26 DIAGNOSIS — R635 Abnormal weight gain: Secondary | ICD-10-CM | POA: Diagnosis not present

## 2014-12-26 DIAGNOSIS — E669 Obesity, unspecified: Secondary | ICD-10-CM | POA: Diagnosis not present

## 2014-12-26 NOTE — Telephone Encounter (Signed)
Rx called in verbally. Terrye Dombrosky, Salome Spotted

## 2015-01-03 ENCOUNTER — Other Ambulatory Visit: Payer: Self-pay | Admitting: Family Medicine

## 2015-01-14 ENCOUNTER — Telehealth: Payer: Self-pay | Admitting: *Deleted

## 2015-01-14 NOTE — Telephone Encounter (Signed)
Pt called stating her blood pressures have been running low.  90/70, 70/60(sunday) and today 113/90.  Pt stated she has not taking any blood pressure medication in two days.  Before that she only took half of half tablet. Pt stated she did feel weak, drowsy and tired.  Denies any symptoms since she stop her medications.  Pt advised she should come in for an appt and discuss stopping medication with her PCP.  Pt requested that PCP give her a call.  Appt with PCP 02/04/15 at 8:45 AM.  Derl Barrow, RN

## 2015-01-14 NOTE — Telephone Encounter (Signed)
Return call to patient about low blood pressures. She reports a few day history of increased weakness, drowsiness, and low blood pressures. Symptoms have improved since stopping her current blood pressure medications. Reports no other associated symptoms including no sore throat/runny nose/chest pain/shortness of breath/abdominal pain/dysuria. Patient was counseled to check her blood pressures over the coming days. She is to continue to hold her home blood pressure medications. She will call her blood pressures into the office on 01/17/2015. I will return her call after she calls in her blood pressures and adjust her medications appropriately at that time. Patient was understanding of the plan.

## 2015-01-23 ENCOUNTER — Inpatient Hospital Stay (HOSPITAL_COMMUNITY)
Admission: EM | Admit: 2015-01-23 | Discharge: 2015-01-27 | DRG: 378 | Disposition: A | Discharge status: Home / Self Care | Payer: Commercial Managed Care - HMO | Attending: Family Medicine | Admitting: Family Medicine

## 2015-01-23 ENCOUNTER — Inpatient Hospital Stay (HOSPITAL_COMMUNITY): Payer: Commercial Managed Care - HMO

## 2015-01-23 ENCOUNTER — Telehealth: Payer: Self-pay | Admitting: Gastroenterology

## 2015-01-23 ENCOUNTER — Encounter (HOSPITAL_COMMUNITY): Payer: Self-pay | Admitting: Emergency Medicine

## 2015-01-23 DIAGNOSIS — D62 Acute posthemorrhagic anemia: Secondary | ICD-10-CM | POA: Diagnosis present

## 2015-01-23 DIAGNOSIS — K922 Gastrointestinal hemorrhage, unspecified: Secondary | ICD-10-CM | POA: Diagnosis present

## 2015-01-23 DIAGNOSIS — Z23 Encounter for immunization: Secondary | ICD-10-CM | POA: Diagnosis not present

## 2015-01-23 DIAGNOSIS — Z833 Family history of diabetes mellitus: Secondary | ICD-10-CM

## 2015-01-23 DIAGNOSIS — Z90710 Acquired absence of both cervix and uterus: Secondary | ICD-10-CM | POA: Diagnosis not present

## 2015-01-23 DIAGNOSIS — G4700 Insomnia, unspecified: Secondary | ICD-10-CM | POA: Diagnosis present

## 2015-01-23 DIAGNOSIS — I10 Essential (primary) hypertension: Secondary | ICD-10-CM | POA: Diagnosis present

## 2015-01-23 DIAGNOSIS — E119 Type 2 diabetes mellitus without complications: Secondary | ICD-10-CM | POA: Diagnosis present

## 2015-01-23 DIAGNOSIS — Z8249 Family history of ischemic heart disease and other diseases of the circulatory system: Secondary | ICD-10-CM

## 2015-01-23 DIAGNOSIS — Z888 Allergy status to other drugs, medicaments and biological substances status: Secondary | ICD-10-CM | POA: Diagnosis not present

## 2015-01-23 DIAGNOSIS — Z823 Family history of stroke: Secondary | ICD-10-CM | POA: Diagnosis not present

## 2015-01-23 DIAGNOSIS — Z8673 Personal history of transient ischemic attack (TIA), and cerebral infarction without residual deficits: Secondary | ICD-10-CM

## 2015-01-23 DIAGNOSIS — E669 Obesity, unspecified: Secondary | ICD-10-CM | POA: Diagnosis present

## 2015-01-23 DIAGNOSIS — Z6831 Body mass index (BMI) 31.0-31.9, adult: Secondary | ICD-10-CM | POA: Diagnosis not present

## 2015-01-23 DIAGNOSIS — J45909 Unspecified asthma, uncomplicated: Secondary | ICD-10-CM | POA: Diagnosis present

## 2015-01-23 DIAGNOSIS — Z9884 Bariatric surgery status: Secondary | ICD-10-CM | POA: Diagnosis not present

## 2015-01-23 DIAGNOSIS — E780 Pure hypercholesterolemia: Secondary | ICD-10-CM | POA: Diagnosis present

## 2015-01-23 DIAGNOSIS — I472 Ventricular tachycardia: Secondary | ICD-10-CM | POA: Diagnosis present

## 2015-01-23 DIAGNOSIS — K5730 Diverticulosis of large intestine without perforation or abscess without bleeding: Secondary | ICD-10-CM | POA: Diagnosis not present

## 2015-01-23 DIAGNOSIS — M13861 Other specified arthritis, right knee: Secondary | ICD-10-CM | POA: Diagnosis present

## 2015-01-23 DIAGNOSIS — J309 Allergic rhinitis, unspecified: Secondary | ICD-10-CM | POA: Diagnosis present

## 2015-01-23 DIAGNOSIS — K5793 Diverticulitis of intestine, part unspecified, without perforation or abscess with bleeding: Secondary | ICD-10-CM

## 2015-01-23 DIAGNOSIS — Z96653 Presence of artificial knee joint, bilateral: Secondary | ICD-10-CM | POA: Diagnosis present

## 2015-01-23 DIAGNOSIS — K921 Melena: Secondary | ICD-10-CM | POA: Diagnosis not present

## 2015-01-23 DIAGNOSIS — M13862 Other specified arthritis, left knee: Secondary | ICD-10-CM | POA: Diagnosis present

## 2015-01-23 DIAGNOSIS — Z7982 Long term (current) use of aspirin: Secondary | ICD-10-CM | POA: Diagnosis not present

## 2015-01-23 DIAGNOSIS — Z8639 Personal history of other endocrine, nutritional and metabolic disease: Secondary | ICD-10-CM

## 2015-01-23 DIAGNOSIS — K5731 Diverticulosis of large intestine without perforation or abscess with bleeding: Principal | ICD-10-CM | POA: Diagnosis present

## 2015-01-23 DIAGNOSIS — Z79899 Other long term (current) drug therapy: Secondary | ICD-10-CM | POA: Diagnosis not present

## 2015-01-23 DIAGNOSIS — D509 Iron deficiency anemia, unspecified: Secondary | ICD-10-CM | POA: Diagnosis not present

## 2015-01-23 DIAGNOSIS — K219 Gastro-esophageal reflux disease without esophagitis: Secondary | ICD-10-CM | POA: Diagnosis present

## 2015-01-23 DIAGNOSIS — Z5189 Encounter for other specified aftercare: Secondary | ICD-10-CM | POA: Diagnosis not present

## 2015-01-23 LAB — COMPREHENSIVE METABOLIC PANEL
ALT: 15 U/L (ref 0–35)
AST: 25 U/L (ref 0–37)
Albumin: 3.1 g/dL — ABNORMAL LOW (ref 3.5–5.2)
Alkaline Phosphatase: 64 U/L (ref 39–117)
Anion gap: 8 (ref 5–15)
BUN: 26 mg/dL — ABNORMAL HIGH (ref 6–23)
CO2: 19 mmol/L (ref 19–32)
Calcium: 8.2 mg/dL — ABNORMAL LOW (ref 8.4–10.5)
Chloride: 116 mmol/L — ABNORMAL HIGH (ref 96–112)
Creatinine, Ser: 0.75 mg/dL (ref 0.50–1.10)
GFR calc Af Amer: >90 mL/min (ref >90)
GFR calc non Af Amer: >90 mL/min (ref >90)
Glucose, Bld: 163 mg/dL — ABNORMAL HIGH (ref 70–99)
Potassium: 3.8 mmol/L (ref 3.5–5.1)
Sodium: 143 mmol/L (ref 135–145)
Total Bilirubin: 0.2 mg/dL — ABNORMAL LOW (ref 0.3–1.2)
Total Protein: 5.4 g/dL — ABNORMAL LOW (ref 6.0–8.3)

## 2015-01-23 LAB — CBC
HCT: 22.8 % — ABNORMAL LOW (ref 36.0–46.0)
HCT: 23.9 % — ABNORMAL LOW (ref 36.0–46.0)
Hemoglobin: 7.3 g/dL — ABNORMAL LOW (ref 12.0–15.0)
Hemoglobin: 7.8 g/dL — ABNORMAL LOW (ref 12.0–15.0)
MCH: 28.3 pg (ref 26.0–34.0)
MCH: 28.3 pg (ref 26.0–34.0)
MCHC: 32 g/dL (ref 30.0–36.0)
MCHC: 32.6 g/dL (ref 30.0–36.0)
MCV: 88.4 fL (ref 78.0–100.0)
MCV: 86.6 fL (ref 78.0–100.0)
Platelets: 193 10*3/uL (ref 150–400)
Platelets: 183 10*3/uL (ref 150–400)
RBC: 2.58 MIL/uL — ABNORMAL LOW (ref 3.87–5.11)
RBC: 2.76 MIL/uL — ABNORMAL LOW (ref 3.87–5.11)
RDW: 15.9 % — ABNORMAL HIGH (ref 11.5–15.5)
RDW: 15.6 % — ABNORMAL HIGH (ref 11.5–15.5)
WBC: 6.3 10*3/uL (ref 4.0–10.5)
WBC: 7 10*3/uL (ref 4.0–10.5)

## 2015-01-23 LAB — PREPARE RBC (CROSSMATCH)

## 2015-01-23 LAB — ABO/RH: ABO/RH(D): O POS

## 2015-01-23 LAB — PROTIME-INR
INR: 1.2 (ref 0.00–1.49)
Prothrombin Time: 15.4 seconds — ABNORMAL HIGH (ref 11.6–15.2)

## 2015-01-23 LAB — MRSA PCR SCREENING: MRSA by PCR: NEGATIVE

## 2015-01-23 LAB — APTT: aPTT: 28 seconds (ref 24–37)

## 2015-01-23 MED ORDER — FLUTICASONE PROPIONATE 50 MCG/ACT NA SUSP
2.0000 | Freq: Every day | NASAL | Status: DC
  Administered 2015-01-25: 2 via NASAL
  Filled 2015-01-23 (×2): qty 16

## 2015-01-23 MED ORDER — SODIUM CHLORIDE 0.9 % IV SOLN
Freq: Once | INTRAVENOUS | Status: AC
  Administered 2015-01-23: 16:00:00 via INTRAVENOUS

## 2015-01-23 MED ORDER — SODIUM CHLORIDE 0.9 % IJ SOLN
3.0000 mL | Freq: Two times a day (BID) | INTRAMUSCULAR | Status: DC
  Administered 2015-01-23 – 2015-01-27 (×7): 3 mL via INTRAVENOUS

## 2015-01-23 MED ORDER — ONDANSETRON HCL 4 MG PO TABS: 8.0000 mg | ORAL_TABLET | Freq: Three times a day (TID) | ORAL | Status: DC | PRN

## 2015-01-23 MED ORDER — SODIUM CHLORIDE 0.9 % IV SOLN
Freq: Once | INTRAVENOUS | Status: AC
  Administered 2015-01-23: via INTRAVENOUS

## 2015-01-23 MED ORDER — PANTOPRAZOLE SODIUM 40 MG IV SOLR
40.0000 mg | Freq: Two times a day (BID) | INTRAVENOUS | Status: DC
  Administered 2015-01-23 – 2015-01-27 (×8): 40 mg via INTRAVENOUS
  Filled 2015-01-23 (×10): qty 40

## 2015-01-23 MED ORDER — TECHNETIUM TC 99M-LABELED RED BLOOD CELLS IV KIT
25.0000 | PACK | Freq: Once | INTRAVENOUS | Status: AC | PRN
  Administered 2015-01-23: 25 via INTRAVENOUS

## 2015-01-23 NOTE — ED Provider Notes (Addendum)
CSN: [id number]     Arrival date & time 01/23/15  1102 History   First MD Initiated Contact with Patient 01/23/15 1220     Chief Complaint  Patient presents with  . Rectal Bleeding     (Consider location/radiation/quality/duration/timing/severity/associated sxs/prior Treatment) HPI Comments: Patient states yesterday she had a bright red bloody bowel movement with clot present. She was having episodes every 15-30 minutes which is now spaced out over 45 minutes to an hour. Prior history of this proximally 5 years ago resulting from diverticulosis. Patient does take a baby aspirin daily but no other anticoagulants.  Patient is a 58 y.o. female presenting with hematochezia. The history is provided by the patient.  Rectal Bleeding Quality:  Bright red Amount:  Copious Duration:  24 hours Timing:  Constant Progression:  Unchanged Chronicity:  Recurrent Context: spontaneously   Similar prior episodes: yes   Relieved by:  Nothing Worsened by:  Nothing tried Ineffective treatments:  None tried Associated symptoms: no abdominal pain, no dizziness, no light-headedness, no loss of consciousness, no recent illness and no vomiting   Risk factors: no anticoagulant use   Risk factors comment:  History of bleeding diverticulosis   Past Medical History  Diagnosis Date  . Anemia   . Mini stroke   . GERD (gastroesophageal reflux disease)   . Arthritis     knees  . Hypertension     under control with meds., has been on med. > 20 yr.  . High cholesterol   . Shoulder impingement 08/2013    right  . Rotator cuff rupture, complete 08/2013    right  . Degenerative arthritis of right shoulder region 08/2013  . Wears dentures     upper  . Wears partial dentures     lower  . History of GI bleed   . Back pain 10/04/2012  . Stroke 2006    Remote left lacunar infarct noted on CT head 2006, 2010    Past Surgical History  Procedure Laterality Date  . 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  Abdominal hysterectomy      partial  . Total knee arthroplasty Left   . Total knee arthroplasty Right   . Colonoscopy  05/02/2000; 05/08/2013  . Shoulder arthroscopy with rotator cuff repair and subacromial decompression Right 08/09/2013    Procedure: RIGHT SHOULDER ARTHROSCOPY WITH SUBACROMIAL DECOMPRESSION, DISTAL CLAVICLE EXCISION AND ROTATOR CUFF REPAIR and release biceps;  Surgeon: Ninetta Lights, MD;  Location: [facility];  Service: Orthopedics;  Laterality: Right;  . Cosmetic surgery  02/22/2014    skin removal surgery from lower abdomen    Family History  Problem Relation Age of Onset  . Cancer Father 54    Died from complications of colon CA  . Diabetes Mother   . Hypertension Mother   . Diabetes Sister   . Heart disease Sister   . Diabetes Sister   . Stroke Sister   . Diabetes Brother   . Stroke Brother   . Diabetes Brother   . Diabetes Brother   . Diabetes Brother    History  Substance Use Topics  . Smoking status: Never Smoker   . Smokeless tobacco: Never Used  . Alcohol Use: Yes     Comment: occasional wine   OB History    No data available     Review of Systems  Gastrointestinal: Positive for hematochezia. Negative for vomiting and abdominal pain.  Neurological: Negative for dizziness, loss of consciousness and light-headedness.  All other systems reviewed and are negative.     Allergies  Aspirin and Nsaids  Home Medications   Prior to Admission medications   Medication Sig Start Date End Date Taking? Authorizing Provider  aspirin EC 81 MG tablet Take 81 mg by mouth.    Historical Provider, MD  atorvastatin (LIPITOR) 40 MG tablet Take 1 tablet (40 mg total) by mouth daily. 08/19/14   Bernadene Bell, MD  B Complex Vitamins (VITAMIN-B COMPLEX) TABS 1 tablet.    Historical Provider, MD  calcium carbonate (OS-CAL) 600 MG TABS tablet Take 1 tablet (600 mg total) by mouth 2 (two) times daily with a meal. 01/07/14   Josalyn Funches, MD   calcium-vitamin D ([employer] OYSTER SHELL CALCIUM-VIT D) 500-200 MG-UNIT per tablet Take 1 tablet by mouth.    Historical Provider, MD  docusate sodium (COLACE) 100 MG capsule Take 1 capsule (100 mg total) by mouth 2 (two) times daily as needed. For constipation 08/19/14   Bernadene Bell, MD  esomeprazole (NEXIUM) 40 MG capsule Take 40 mg by mouth. 12/05/13   Historical Provider, MD  ferrous sulfate 220 (44 FE) MG/5ML solution TAKE 6.32ml DAILY 30 MINUTES BEFORE BREAKFAST- MIX WITH 1/2 GLASS OF ORANGE JUICE 01/03/15   [name], MD  fexofenadine (ALLEGRA) 180 MG tablet Take 180 mg by mouth.    Historical Provider, MD  fluticasone (FLONASE) 50 MCG/ACT nasal spray Place 2 sprays into both nostrils at bedtime. 01/07/14   Josalyn Funches, MD  lisinopril-hydrochlorothiazide (PRINZIDE,ZESTORETIC) 20-12.5 MG per tablet Take 2 tablets by mouth daily. 08/19/14   Bernadene Bell, MD  Multiple Vitamin (MULTIVITAMIN) tablet Take 1 tablet by mouth daily. 01/07/14   Josalyn Funches, MD  nitroGLYCERIN (NITROSTAT) 0.4 MG SL tablet Place 0.4 mg under the tongue.    Historical Provider, MD  polyethylene glycol powder (GLYCOLAX/MIRALAX) powder TAKE 17G BY MOUTH TWICE DAILY 11/07/14   [name], MD  ursodiol (ACTIGALL) 300 MG capsule Take 1 capsule (300 mg total) by mouth 2 (two) times daily. 08/19/14   Bernadene Bell, MD  vitamin B-12 (CYANOCOBALAMIN) 100 MCG tablet Take 100 mcg by mouth daily.    Historical Provider, MD  vitamin C (ASCORBIC ACID) 250 MG tablet 250 mg.    Historical Provider, MD  zolpidem (AMBIEN) 10 MG tablet TAKE ONE TABLET AT BEDTIME AS NEEDED FOR SLEEP 12/26/14   [name], MD   BP 105/64 mmHg  Pulse 92  Temp(Src) 98 F (36.7 C) (Oral)  Resp 18  SpO2 99% Physical Exam  Constitutional: She is oriented to person, place, and time. She appears well-developed and well-nourished. No distress.  HENT:  Head: Normocephalic and atraumatic.  Mouth/Throat: Oropharynx is clear and moist.  Eyes: EOM are  normal. Pupils are equal, round, and reactive to light.  Pale conjunctiva  Neck: Normal range of motion. Neck supple.  Cardiovascular: Normal rate, regular rhythm and intact distal pulses.   No murmur heard. Pulmonary/Chest: Effort normal and breath sounds normal. No respiratory distress. She has no wheezes. She has no rales.  Abdominal: Soft. She exhibits no distension. There is no tenderness. There is no rebound and no guarding.  Genitourinary:  Dark blood clots at the rectum.  No hemorrhoids  Musculoskeletal: Normal range of motion. She exhibits no edema or tenderness.  Neurological: She is alert and oriented to person, place, and time.  Skin: Skin is warm and dry. No rash noted. No erythema. There is pallor.  Psychiatric: She has a  normal mood and affect. Her behavior is normal.  Nursing note and vitals reviewed.   ED Course  Procedures (including critical care time) Labs Review Labs Reviewed  CBC - Abnormal; Notable for the following:    RBC 2.58 (*)    Hemoglobin 7.3 (*)    HCT 22.8 (*)    RDW 15.9 (*)    All other components within normal limits  COMPREHENSIVE METABOLIC PANEL - Abnormal; Notable for the following:    Chloride 116 (*)    Glucose, Bld 163 (*)    BUN 26 (*)    Calcium 8.2 (*)    Total Protein 5.4 (*)    Albumin 3.1 (*)    Total Bilirubin 0.2 (*)    All other components within normal limits  TYPE AND SCREEN  ABO/RH  PREPARE RBC (CROSSMATCH)    Imaging Review No results found.   EKG Interpretation None      MDM   Final diagnoses:  Lower GI bleeding    Patient presenting with acute onset of GI bleeding that started yesterday at 35 AM. She has had recurrent episodes of bleeding with clots and bright red blood. She only takes a baby aspirin a day. She has a prior history of lower GI bleeding from diverticulosis. Last episode was 5 years ago. Today patient's hemoglobin has dropped from 10-7 and a 24-hour time period. Patient had blood transfusion  orders started for her to receive 2 units of blood. She is currently hemodynamically stable. She has no abdominal pain or symptoms concerning for acute abdomen. Discussed with [facility] who will consult on the patient and will admit the patient for further care to family medicine who is her PCP  CRITICAL CARE Performed by: Blanchie Dessert Total critical care time: 30 Critical care time was exclusive of separately billable procedures and treating other patients. Critical care was necessary to treat or prevent imminent or life-threatening deterioration. Critical care was time spent personally by me on the following activities: development of treatment plan with patient and/or surrogate as well as nursing, discussions with consultants, evaluation of patient's response to treatment, examination of patient, obtaining history from patient or surrogate, ordering and performing treatments and interventions, ordering and review of laboratory studies, ordering and review of radiographic studies, pulse oximetry and re-evaluation of patient's condition.   Blanchie Dessert, MD 01/23/15 [place], MD 01/23/15 1354

## 2015-01-23 NOTE — ED Notes (Signed)
Pt c/o bright red rectal bleeding onset yesterday, pt states large clots have passed from her rectum.

## 2015-01-23 NOTE — Consult Note (Signed)
Consultation  Referring Provider:  [facility] - Dr. Maryan Rued    Primary Care Physician:  [name], MD Primary Gastroenterologist:   Erskine Emery, MD      Reason for Consultation:   GI bleed           HPI:   [name] is a 59 y.o. female known to Dr. Deatra Ina. She has a history of recurrent lower gastrointestinal bleeding, presumably diverticular in origin. Patient began having recurrent painless hematochezia yesterday. Patient takes a baby asa, no other NSAIDS. No blood thinners. She is dizzy when sitting up but not SOB. Hgb down 3 grams from baseline.   Past Medical History  Diagnosis Date  . Anemia   . Mini stroke   . GERD (gastroesophageal reflux disease)   . Arthritis     knees  . Hypertension     under control with meds., has been on med. > 20 yr.  . High cholesterol   . Shoulder impingement 08/2013    right  . Rotator cuff rupture, complete 08/2013    right  . Degenerative arthritis of right shoulder region 08/2013  . Wears dentures     upper  . Wears partial dentures     lower  . History of GI bleed   . Back pain 10/04/2012  . Stroke 2006    Remote left lacunar infarct noted on CT head 2006, 2010     Past Surgical History  Procedure Laterality Date  . 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 Abdominal hysterectomy      partial  . Total knee arthroplasty Left   . Total knee arthroplasty Right   . Colonoscopy  05/02/2000; 05/08/2013  . Shoulder arthroscopy with rotator cuff repair and subacromial decompression Right 08/09/2013    Procedure: RIGHT SHOULDER ARTHROSCOPY WITH SUBACROMIAL DECOMPRESSION, DISTAL CLAVICLE EXCISION AND ROTATOR CUFF REPAIR and release biceps;  Surgeon: Ninetta Lights, MD;  Location: [facility];  Service: Orthopedics;  Laterality: Right;  . Cosmetic surgery  02/22/2014    skin removal surgery from lower abdomen     Family History  Problem Relation Age of Onset  . Cancer Father 65    Died from  complications of colon CA  . Diabetes Mother   . Hypertension Mother   . Diabetes Sister   . Heart disease Sister   . Diabetes Sister   . Stroke Sister   . Diabetes Brother   . Stroke Brother   . Diabetes Brother   . Diabetes Brother   . Diabetes Brother      History  Substance Use Topics  . Smoking status: Never Smoker   . Smokeless tobacco: Never Used  . Alcohol Use: Yes     Comment: occasional wine    Prior to Admission medications   Medication Sig Start Date End Date Taking? Authorizing Provider  aspirin EC 81 MG tablet Take 81 mg by mouth.    Historical Provider, MD  atorvastatin (LIPITOR) 40 MG tablet Take 1 tablet (40 mg total) by mouth daily. 08/19/14   Bernadene Bell, MD  B Complex Vitamins (VITAMIN-B COMPLEX) TABS 1 tablet.    Historical Provider, MD  calcium carbonate (OS-CAL) 600 MG TABS tablet Take 1 tablet (600 mg total) by mouth 2 (two) times daily with a meal. 01/07/14   Josalyn Funches, MD  calcium-vitamin D ([employer] OYSTER SHELL CALCIUM-VIT D) 500-200 MG-UNIT per tablet Take 1 tablet by mouth.  Historical Provider, MD  docusate sodium (COLACE) 100 MG capsule Take 1 capsule (100 mg total) by mouth 2 (two) times daily as needed. For constipation 08/19/14   Bernadene Bell, MD  esomeprazole (NEXIUM) 40 MG capsule Take 40 mg by mouth. 12/05/13   Historical Provider, MD  ferrous sulfate 220 (44 FE) MG/5ML solution TAKE 6.75ml DAILY 30 MINUTES BEFORE BREAKFAST- MIX WITH 1/2 GLASS OF ORANGE JUICE 01/03/15   [name], MD  fexofenadine (ALLEGRA) 180 MG tablet Take 180 mg by mouth.    Historical Provider, MD  fluticasone (FLONASE) 50 MCG/ACT nasal spray Place 2 sprays into both nostrils at bedtime. 01/07/14   Josalyn Funches, MD  lisinopril-hydrochlorothiazide (PRINZIDE,ZESTORETIC) 20-12.5 MG per tablet Take 2 tablets by mouth daily. 08/19/14   Bernadene Bell, MD  Multiple Vitamin (MULTIVITAMIN) tablet Take 1 tablet by mouth daily. 01/07/14   Josalyn Funches, MD  nitroGLYCERIN  (NITROSTAT) 0.4 MG SL tablet Place 0.4 mg under the tongue.    Historical Provider, MD  polyethylene glycol powder (GLYCOLAX/MIRALAX) powder TAKE 17G BY MOUTH TWICE DAILY 11/07/14   [name], MD  ursodiol (ACTIGALL) 300 MG capsule Take 1 capsule (300 mg total) by mouth 2 (two) times daily. 08/19/14   Bernadene Bell, MD  vitamin B-12 (CYANOCOBALAMIN) 100 MCG tablet Take 100 mcg by mouth daily.    Historical Provider, MD  vitamin C (ASCORBIC ACID) 250 MG tablet 250 mg.    Historical Provider, MD  zolpidem (AMBIEN) 10 MG tablet TAKE ONE TABLET AT BEDTIME AS NEEDED FOR SLEEP 12/26/14   [name], MD    Current Facility-Administered Medications  Medication Dose Route Frequency Provider Last Rate Last Dose  . 0.9 %  sodium chloride infusion   Intravenous Once Blanchie Dessert, MD       Current Outpatient Prescriptions  Medication Sig Dispense Refill  . aspirin EC 81 MG tablet Take 81 mg by mouth.    Marland Kitchen atorvastatin (LIPITOR) 40 MG tablet Take 1 tablet (40 mg total) by mouth daily. 90 tablet 3  . B Complex Vitamins (VITAMIN-B COMPLEX) TABS 1 tablet.    . calcium carbonate (OS-CAL) 600 MG TABS tablet Take 1 tablet (600 mg total) by mouth 2 (two) times daily with a meal. 180 tablet 1  . calcium-vitamin D ([employer] OYSTER SHELL CALCIUM-VIT D) 500-200 MG-UNIT per tablet Take 1 tablet by mouth.    . docusate sodium (COLACE) 100 MG capsule Take 1 capsule (100 mg total) by mouth 2 (two) times daily as needed. For constipation 180 capsule 1  . esomeprazole (NEXIUM) 40 MG capsule Take 40 mg by mouth.    . ferrous sulfate 220 (44 FE) MG/5ML solution TAKE 6.68ml DAILY 30 MINUTES BEFORE BREAKFAST- MIX WITH 1/2 GLASS OF ORANGE JUICE 240 mL 2  . fexofenadine (ALLEGRA) 180 MG tablet Take 180 mg by mouth.    . fluticasone (FLONASE) 50 MCG/ACT nasal spray Place 2 sprays into both nostrils at bedtime. 16 g 6  . lisinopril-hydrochlorothiazide (PRINZIDE,ZESTORETIC) 20-12.5 MG per tablet Take 2 tablets by mouth daily.  180 tablet 3  . Multiple Vitamin (MULTIVITAMIN) tablet Take 1 tablet by mouth daily. 90 tablet 1  . nitroGLYCERIN (NITROSTAT) 0.4 MG SL tablet Place 0.4 mg under the tongue.    . polyethylene glycol powder (GLYCOLAX/MIRALAX) powder TAKE 17G BY MOUTH TWICE DAILY 850 g 1  . ursodiol (ACTIGALL) 300 MG capsule Take 1 capsule (300 mg total) by mouth 2 (two) times daily. 180 capsule 1  .  vitamin B-12 (CYANOCOBALAMIN) 100 MCG tablet Take 100 mcg by mouth daily.    . vitamin C (ASCORBIC ACID) 250 MG tablet 250 mg.    . zolpidem (AMBIEN) 10 MG tablet TAKE ONE TABLET AT BEDTIME AS NEEDED FOR SLEEP 30 tablet 1    Allergies as of 01/23/2015 - Review Complete 01/23/2015  Allergen Reaction Noted  . Aspirin Other (See Comments) 05/12/2007  . Nsaids Other (See Comments) 07/24/2009     Review of Systems:    All systems reviewed and negative except where noted in HPI.  Gen: Denies any fever, chills, sweats, anorexia, fatigue, weakness, malaise, weight loss, and sleep disorder CV: Denies chest pain, angina, palpitations, syncope, orthopnea, PND, peripheral edema, and claudication. Resp: Denies dyspnea at rest, dyspnea with exercise, cough, sputum, wheezing, coughing up blood, and pleurisy. GI: Denies vomiting blood, jaundice, and fecal incontinence.   Denies dysphagia or odynophagia. GU : Denies urinary burning, blood in urine, urinary frequency, urinary hesitancy, nocturnal urination, and urinary incontinence. MS: Denies joint pain, limitation of movement, and swelling, stiffness, low back pain, extremity pain. Denies muscle weakness, cramps, atrophy.  Derm: Denies rash, itching, dry skin, hives, moles, warts, or unhealing ulcers.  Psych: Denies depression, anxiety, memory loss, suicidal ideation, hallucinations, paranoia, and confusion. Heme: Denies bruising, bleeding, and enlarged lymph nodes. Neuro:  Denies any headaches, dizziness, paresthesias. Endo:  Denies any problems with DM, thyroid, adrenal  function.    Physical Exam:  Vital signs in last 24 hours: Temp:  [97.8 F (36.6 C)-98.3 F (36.8 C)] 98.3 F (36.8 C) (03/24 1519) Pulse Rate:  [86-109] 88 (03/24 1519) Resp:  [14-18] 14 (03/24 1519) BP: (97-116)/(60-91) 105/60 mmHg (03/24 1519) SpO2:  [98 %-100 %] 100 % (03/24 1519)   General:   Pleasant black female in NAD Head:  Normocephalic and atraumatic. Eyes:   No icterus.   Conjunctiva pink. Ears:  Normal auditory acuity. Neck:  Supple; no masses felt Lungs:  Respirations even and unlabored. Lungs clear to auscultation bilaterally.   No wheezes, crackles, or rhonchi.  Heart:  Regular rate and rhythm Abdomen:  Soft, nondistended, nontender. Normal bowel sounds. No appreciable masses or hepatomegaly.  Rectal:  Red blood in vault.   Msk:  Symmetrical without gross deformities.  Extremities:  Without edema. Neurologic:  Alert and  oriented x4;  grossly normal neurologically. Skin:  Intact without significant lesions or rashes. Cervical Nodes:  No significant cervical adenopathy. Psych:  Alert and cooperative. Normal affect.  LAB RESULTS:  Recent Labs  01/23/15 1128  WBC 6.3  HGB 7.3*  HCT 22.8*  PLT 193   BMET  Recent Labs  01/23/15 1128  NA 143  K 3.8  CL 116*  CO2 19  GLUCOSE 163*  BUN 26*  CREATININE 0.75  CALCIUM 8.2*   LFT  Recent Labs  01/23/15 1128  PROT 5.4*  ALBUMIN 3.1*  AST 25  ALT 15  ALKPHOS 64  BILITOT 0.2*    PREVIOUS ENDOSCOPIES:            Most recent colonoscopy January 2016 - for hematochezia. Findings included pandiverticulosis   Impression / Plan:   62. 59 year old female with recurrent painless gastrointestinal bleeding and pan-diverticulosis, we need to try and localize site of bleeding. Patient will be sent for RBC scan, if positive then needs Interventional Radiology to evaluate and treat. Dr. Collene Mares will be covering for [facility] starting at 5pm today, please call [phone] and ask for GI MD on call if  RBC scan is  positive. Patient cared for by [facility] so she will be transferred to [facility] today. Upon arrival she will go directly to [facility] for scan.    2. Anemia of acute blood loss. Blood has already been ordered.   Thanks   LOS: 0 days   Tye Savoy  01/23/2015, 3:29 PM   ________________________________________________________________________  [facility] MD note:  I personally examined the patient, reviewed the data and agree with the assessment and plan described above.  This is at least her third significant GI bleeding. This episode, as the others, is presumed to be from colonic diverticulosis. She has diverticulum throughout her colon.  She is 67 and I suspect these episodes will continue to happen.  There was reported a + nuc med bleeding scan in 2009 however I do not see that report. A follow up nuc scan 2-3 days later was negative for bleeding and also follow up angiogram via IR in 2009 was also negative.  Will again try to localize her bleeding with nuc med scan now, if positive she should go to IR for angiogram urgently.  Transfuse blood as needed.   Owens Loffler, MD [facility] Pager [phone]

## 2015-01-23 NOTE — H&P (Signed)
[facility] Admission History and Physical Service Pager: [phone]  [name] Medical record number: [id number] Date of birth: Apr 21, 1956 Age: 59 y.o. Gender: female  Primary Care Provider: [name], MD Consultants: GI Code Status: full  Chief Complaint: GI bleed  Assessment and Plan: Ayssa JAQUELYN SAKAMOTO is a 59 y.o. female presenting with GI bleed. PMH is significant for previous GI bleed; history of diabetes and hypertension; status post gastric bypass surgery.   # GI bleed: BRBPR x 4-5 episodes. Likely due to diverticulosis. Recent colonoscopy with Dr. Deatra Ina in July 2014 found to have one sessile polyp in the sigmoid colon biopsy consistent with an inflammatory polyp. Also had severe diverticulosis universally. Previous GI bleed November 2015; source never identified. status post gastric bypass done in July 2013.  - VSS s/p 1u pRBCs; continue to monitor vitals and transfuse as needed - NM RBC scan negative. PT/INR/PTT wnl - GI following - PPI IV BID - Clear liquid diet pending GI eval in AM - Monitor closely in stepdown on telemetry - CBC q 6hrs while VVS; Hbg 7 (01/23/15) - POA was 10.6 (10/15/14).   # Hx of HTN: BP wnl and HTN medications recently discontinued to low BP. She reports blood pressure has been well controlled status post her gastric bypass.  - Continue to monitor BP   FEN/GI: Clear liquids; KVO Prophylaxis: SCDs; No anticoagulation due to acute bleed  Disposition: admit to stepdown; discharge pending GI workup  History of Present Illness: [name] is a 59 y.o. female presenting after episodes of bright red blood per rectum. She reports mild abdominal pain and first a bowel movement yesterday at 11 AM. She denied any other symptoms at that time; no chest pain, shortness of breath, or dizziness.  She went to her follow-up appointment with the surgeon who performed her gastric bypass this morning told him about her  bloody bowel movements and reported that she felt fatigued and dizzy and he sent her to ED for further evaluation. Since yesterday, she reports 4-5 additional bloody bowel movements, but continues to deny chest pain or shortness of breath. She was seen by GI at [facility] and found to have a hemoglobin of 7, down 3 g from baseline.  She received 1 unit of RBCs, and was sent to [facility] for NM RBC scan. She reports that she is currently asymptomatic after receiving 1 unit of blood.  She reports history of diverticulosis. She reports previous episode of frank blood per rectum in November  Review Of Systems: Per HPI with the following additions:  Otherwise 12 point review of systems was performed and was unremarkable.  Patient Active Problem List   Diagnosis Date Noted  . GI bleeding 01/23/2015  . Diverticulosis of colon without hemorrhage 11/18/2014  . Blood in stool 10/07/2014  . Pain in joint, shoulder region 07/25/2014  . Muscle cramps 04/22/2014  . Biliary sludge 04/08/2014  . Bunion 03/28/2014  . Foot pain, bilateral 01/11/2014  . Constipation 10/19/2013  . Subacromial bursitis 05/21/2013  . Eczema 05/21/2013  . External hemorrhoids without complication 77/82/4235  . Insomnia 08/29/2012  . GERD (gastroesophageal reflux disease) 08/24/2012  . S/P gastric bypass 06/07/2012  . Asthma, cough variant 03/10/2012  . Diverticulosis of colon with hemorrhage   . Hypopigmented skin lesion 02/23/2011  . 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 ALLERGIC RHINITIS CAUSE UNSPECIFIED 05/27/2010  . POSTMENOPAUSAL STATUS 04/07/2010  . LIBIDO, DECREASED 10/16/2009  . H/O type  2 diabetes mellitus 01/25/2008  . CYST, KIDNEY, ACQUIRED 08/15/2007  . CEREBROVASCULAR ACCIDENT, HX OF 07/26/2007  . HTN, goal below 140/90 05/12/2007  . OBESITY, NOS 12/29/2006  . ANEMIA, IRON DEFICIENCY, UNSPEC. 12/29/2006   Past Medical History: Past Medical History  Diagnosis Date  . Anemia   . Mini stroke   .  GERD (gastroesophageal reflux disease)   . Arthritis     knees  . Hypertension     under control with meds., has been on med. > 20 yr.  . High cholesterol   . Shoulder impingement 08/2013    right  . Rotator cuff rupture, complete 08/2013    right  . Degenerative arthritis of right shoulder region 08/2013  . Wears dentures     upper  . Wears partial dentures     lower  . History of GI bleed   . Back pain 10/04/2012  . Stroke 2006    Remote left lacunar infarct noted on CT head 2006, 2010    Past Surgical History: Past Surgical History  Procedure Laterality Date  . 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 Abdominal hysterectomy      partial  . Total knee arthroplasty Left   . Total knee arthroplasty Right   . Colonoscopy  05/02/2000; 05/08/2013  . Shoulder arthroscopy with rotator cuff repair and subacromial decompression Right 08/09/2013    Procedure: RIGHT SHOULDER ARTHROSCOPY WITH SUBACROMIAL DECOMPRESSION, DISTAL CLAVICLE EXCISION AND ROTATOR CUFF REPAIR and release biceps;  Surgeon: Ninetta Lights, MD;  Location: [place];  Service: Orthopedics;  Laterality: Right;  . Cosmetic surgery  02/22/2014    skin removal surgery from lower abdomen    Social History: History  Substance Use Topics  . Smoking status: Never Smoker   . Smokeless tobacco: Never Used  . Alcohol Use: Yes     Comment: occasional wine   Additional social history:   Please also refer to relevant sections of EMR.  Family History: Family History  Problem Relation Age of Onset  . Cancer Father 51    Died from complications of colon CA  . Diabetes Mother   . Hypertension Mother   . Diabetes Sister   . Heart disease Sister   . Diabetes Sister   . Stroke Sister   . Diabetes Brother   . Stroke Brother   . Diabetes Brother   . Diabetes Brother   . Diabetes Brother    Allergies and Medications: Allergies  Allergen Reactions  . Aspirin Other (See Comments)    GI BLEED  . Nsaids Other  (See Comments)    GI BLEED   No current facility-administered medications on file prior to encounter.   Current Outpatient Prescriptions on File Prior to Encounter  Medication Sig Dispense Refill  . ferrous sulfate 220 (44 FE) MG/5ML solution TAKE 6.36ml DAILY 30 MINUTES BEFORE BREAKFAST- MIX WITH 1/2 GLASS OF ORANGE JUICE 240 mL 2  . polyethylene glycol powder (GLYCOLAX/MIRALAX) powder TAKE 17G BY MOUTH TWICE DAILY 850 g 1  . ursodiol (ACTIGALL) 300 MG capsule Take 1 capsule (300 mg total) by mouth 2 (two) times daily. 180 capsule 1  . zolpidem (AMBIEN) 10 MG tablet TAKE ONE TABLET AT BEDTIME AS NEEDED FOR SLEEP 30 tablet 1  . atorvastatin (LIPITOR) 40 MG tablet Take 1 tablet (40 mg total) by mouth daily. 90 tablet 3  . calcium carbonate (OS-CAL) 600 MG TABS tablet Take 1 tablet (600 mg total) by mouth 2 (two) times daily  with a meal. 180 tablet 1  . docusate sodium (COLACE) 100 MG capsule Take 1 capsule (100 mg total) by mouth 2 (two) times daily as needed. For constipation 180 capsule 1  . fluticasone (FLONASE) 50 MCG/ACT nasal spray Place 2 sprays into both nostrils at bedtime. 16 g 6  . lisinopril-hydrochlorothiazide (PRINZIDE,ZESTORETIC) 20-12.5 MG per tablet Take 2 tablets by mouth daily. 180 tablet 3  . Multiple Vitamin (MULTIVITAMIN) tablet Take 1 tablet by mouth daily. 90 tablet 1    Objective: BP 121/77 mmHg  Pulse 86  Temp(Src) 98.5 F (36.9 C) (Oral)  Resp 23  Ht 5\' 4"  (1.626 m)  Wt 188 lb 4.4 oz (85.4 kg)  BMI 32.30 kg/m2  SpO2 98% Exam: Gen: NAD; cooperative with exam Neuro: Alert and oriented, No gross deficits HEENT: NCAT, PERRL; EOMI; MMM CV: RRR, no m/r/g; Radial and DP pulse 2+ b/l; Cap refill < 3 sec Resp: CTAB, normal respiratory effort Abd: Mild LLQ tenderness; No guarding or rebounding; No Masses Ext: WWP; trace edema  Labs and Imaging: CBC BMET   Recent Labs Lab 01/23/15 1128  WBC 6.3  HGB 7.3*  HCT 22.8*  PLT 193    Recent Labs Lab  01/23/15 1128  NA 143  K 3.8  CL 116*  CO2 19  BUN 26*  CREATININE 0.75  GLUCOSE 163*  CALCIUM 8.2*     NM GI Scan: No scintigraphic evidence of ongoing GI bleed.  Olam Idler, MD 01/23/2015, 9:21 PM PGY-2, [place] Intern pager: [phone], text pages welcome

## 2015-01-23 NOTE — Telephone Encounter (Signed)
Patient reports 2 days of feeling dizzy when she stands up and a generalized feeling of weakness. She is having bright red blood with each bowel movement. She says it is "really bad" today. She is instructed to present to the [place] for evaluation. Patient expresses understanding.

## 2015-01-23 NOTE — ED Notes (Signed)
Mendel Ryder primary RN on floor notified that blood will be started prior to arrival there and that pt needs to be transported to NM GI Blood Loss STAT on arrival per [facility] gastroenterology.

## 2015-01-23 NOTE — ED Notes (Signed)
Per Maryan Rued MD no need to occult gross clot formation noted on assessment with provider

## 2015-01-23 NOTE — ED Notes (Addendum)
Per [employer] called blood bank regarding 2 unites PRBC. Per Lab blood is not ready at this time.

## 2015-01-24 DIAGNOSIS — K922 Gastrointestinal hemorrhage, unspecified: Secondary | ICD-10-CM | POA: Diagnosis present

## 2015-01-24 LAB — COMPREHENSIVE METABOLIC PANEL
ALT: 17 U/L (ref 0–35)
AST: 33 U/L (ref 0–37)
Albumin: 2.6 g/dL — ABNORMAL LOW (ref 3.5–5.2)
Alkaline Phosphatase: 57 U/L (ref 39–117)
Anion gap: 5 (ref 5–15)
BUN: 15 mg/dL (ref 6–23)
CO2: 20 mmol/L (ref 19–32)
Calcium: 8.4 mg/dL (ref 8.4–10.5)
Chloride: 114 mmol/L — ABNORMAL HIGH (ref 96–112)
Creatinine, Ser: 0.71 mg/dL (ref 0.50–1.10)
GFR calc Af Amer: >90 mL/min (ref >90)
GFR calc non Af Amer: >90 mL/min (ref >90)
Glucose, Bld: 156 mg/dL — ABNORMAL HIGH (ref 70–99)
Potassium: 3.7 mmol/L (ref 3.5–5.1)
Sodium: 139 mmol/L (ref 135–145)
Total Bilirubin: 0.7 mg/dL (ref 0.3–1.2)
Total Protein: 4.9 g/dL — ABNORMAL LOW (ref 6.0–8.3)

## 2015-01-24 LAB — CBC
HCT: 22.6 % — ABNORMAL LOW (ref 36.0–46.0)
HCT: 25.7 % — ABNORMAL LOW (ref 36.0–46.0)
HCT: 28.8 % — ABNORMAL LOW (ref 36.0–46.0)
Hemoglobin: 7.4 g/dL — ABNORMAL LOW (ref 12.0–15.0)
Hemoglobin: 8.6 g/dL — ABNORMAL LOW (ref 12.0–15.0)
Hemoglobin: 9.5 g/dL — ABNORMAL LOW (ref 12.0–15.0)
MCH: 28.1 pg (ref 26.0–34.0)
MCH: 28.5 pg (ref 26.0–34.0)
MCH: 28.3 pg (ref 26.0–34.0)
MCHC: 32.7 g/dL (ref 30.0–36.0)
MCHC: 33.5 g/dL (ref 30.0–36.0)
MCHC: 33 g/dL (ref 30.0–36.0)
MCV: 85.9 fL (ref 78.0–100.0)
MCV: 85.1 fL (ref 78.0–100.0)
MCV: 85.7 fL (ref 78.0–100.0)
Platelets: 157 10*3/uL (ref 150–400)
Platelets: 158 10*3/uL (ref 150–400)
Platelets: 176 10*3/uL (ref 150–400)
RBC: 2.63 MIL/uL — ABNORMAL LOW (ref 3.87–5.11)
RBC: 3.02 MIL/uL — ABNORMAL LOW (ref 3.87–5.11)
RBC: 3.36 MIL/uL — ABNORMAL LOW (ref 3.87–5.11)
RDW: 15.5 % (ref 11.5–15.5)
RDW: 15.2 % (ref 11.5–15.5)
RDW: 15.7 % — ABNORMAL HIGH (ref 11.5–15.5)
WBC: 6.8 10*3/uL (ref 4.0–10.5)
WBC: 6 10*3/uL (ref 4.0–10.5)
WBC: 7.9 10*3/uL (ref 4.0–10.5)

## 2015-01-24 LAB — PHOSPHORUS: Phosphorus: 2.7 mg/dL (ref 2.3–4.6)

## 2015-01-24 LAB — MAGNESIUM: Magnesium: 1.9 mg/dL (ref 1.5–2.5)

## 2015-01-24 LAB — PREPARE RBC (CROSSMATCH)

## 2015-01-24 MED ORDER — PNEUMOCOCCAL VAC POLYVALENT 25 MCG/0.5ML IJ INJ
0.5000 mL | INJECTION | INTRAMUSCULAR | Status: AC
  Administered 2015-01-25: 0.5 mL via INTRAMUSCULAR
  Filled 2015-01-24: qty 0.5

## 2015-01-24 MED ORDER — INFLUENZA VAC SPLIT QUAD 0.5 ML IM SUSY
0.5000 mL | PREFILLED_SYRINGE | INTRAMUSCULAR | Status: AC
  Administered 2015-01-25: 0.5 mL via INTRAMUSCULAR
  Filled 2015-01-24: qty 0.5

## 2015-01-24 NOTE — Progress Notes (Signed)
Utilization review completed. Anacleto Batterman, RN, BSN. 

## 2015-01-24 NOTE — Progress Notes (Signed)
[name] requests a pneumonia and flu vaccine.  Orders entered for this.

## 2015-01-24 NOTE — Progress Notes (Signed)
Family Medicine Teaching Service Daily Progress Note Intern Pager: [phone]  [name] Medical record number: [id number] Date of birth: Sep 13, 1956 Age: 59 y.o. Gender: female  Primary Care Provider: [name], MD Consultants: GI Code Status: Full  Pt Overview and Major Events to Date:  3/24: GI bleed - likely diverticulosis; transfused 2u pRBC; NM RBC Neg  Assessment and Plan: [name] is a 59 y.o. female presenting with GI bleed. PMH is significant for previous GI bleed; history of diabetes and hypertension; status post gastric bypass surgery.   # GI bleed: BRBPR x 4-5 episodes. Likely due to diverticulosis. Recent colonoscopy with Dr. Deatra Ina in July 2014 found to have one sessile polyp in the sigmoid colon biopsy consistent with an inflammatory polyp. Also had severe diverticulosis universally. Previous GI bleed November 2015; source never identified. status post gastric bypass done in July 2013.  - VSS s/p 3u pRBCs; continue to monitor vitals and transfuse as needed - NM RBC scan negative. PT/INR/PTT wnl - GI following - PPI IV BID - Clear liquid diet pending GI eval in AM - Monitor closely in stepdown on telemetry - CBC q 6hrs while VVS; Hbg 7.4 (01/24/15) from 7.3 (admit) s/p 2-3u RBCs - POA was 10.6 (10/15/14).   # Hx of HTN: BP wnl and HTN medications recently discontinued to low BP. She reports blood pressure has been well controlled status post her gastric bypass.  - Continue to monitor BP   FEN/GI: Clear liquids; KVO Prophylaxis: SCDs; No anticoagulation due to acute bleed  Disposition: admit to stepdown; discharge pending GI workup  Subjective:  Reports some dizziness with position changes. Otherwise no CP, SOB. Since admit 1 additional bloody BM.   Objective: Temp:  [97.5 F (36.4 C)-98.9 F (37.2 C)] 98.5 F (36.9 C) (03/25 0830) Pulse Rate:  [76-109] 87 (03/25 0813) Resp:  [14-23] 15 (03/25 0622) BP: (97-138)/(60-91) 138/86 mmHg  (03/25 0813) SpO2:  [96 %-100 %] 100 % (03/25 0813) Weight:  [188 lb 4.4 oz (85.4 kg)] 188 lb 4.4 oz (85.4 kg) (03/24 2011) Physical Exam: Gen: NAD; cooperative with exam Neuro: Alert and oriented, No gross deficits HEENT: NCAT, PERRL; EOMI; MMM CV: RRR, no m/r/g; Radial and DP pulse 2+ b/l; Cap refill < 3 sec Resp: CTAB, normal respiratory effort Abd: Mild LLQ tenderness; No guarding or rebounding; No Masses Ext: WWP; trace edema  Laboratory:  Recent Labs Lab 01/23/15 1128 01/23/15 2212 01/24/15 0311  WBC 6.3 7.0 6.8  HGB 7.3* 7.8* 7.4*  HCT 22.8* 23.9* 22.6*  PLT 193 183 157    Recent Labs Lab 01/23/15 1128  NA 143  K 3.8  CL 116*  CO2 19  BUN 26*  CREATININE 0.75  CALCIUM 8.2*  PROT 5.4*  BILITOT 0.2*  ALKPHOS 64  ALT 15  AST 25  GLUCOSE 163*   Imaging/Diagnostic Tests: NM RBC scan 3/24: There are no definitive abnormal areas of radiotracer activity which persist or transit within the bowel lumen to suggest ongoing GI bleed.  Olam Idler, MD 01/24/2015, 10:09 AM PGY-2, [place] Intern pager: [phone], text pages welcome

## 2015-01-24 NOTE — Progress Notes (Signed)
Cross cover [facility]-GI Subjective: Events since admission noted. Her RBC tagged scan done emergently yesterday was negative, She has had no further hematochezia today. She denies having any abdominal pain, nausea, vomiting, fever or chills. She ahs received 3 units of PBC's since admission. She has had a recent colonoscopy that revealed pandiverticulosis.  Objective: Vital signs in last 24 hours: Temp:  [97.5 F (36.4 C)-98.9 F (37.2 C)] 98.4 F (36.9 C) (03/25 1211) Pulse Rate:  [76-94] 77 (03/25 1211) Resp:  [14-23] 14 (03/25 1211) BP: (105-138)/(60-86) 128/79 mmHg (03/25 1211) SpO2:  [96 %-100 %] 100 % (03/25 1211) Weight:  [85.4 kg (188 lb 4.4 oz)] 85.4 kg (188 lb 4.4 oz) (03/24 2011) Last BM Date: 01/23/15  Intake/Output from previous day: 03/24 0701 - 03/25 0700 In: 615 [I.V.:3; Blood:612] Out: 500 [Urine:500] Intake/Output this shift: Total I/O In: 703 [P.O.:450; I.V.:3; Blood:250] Out: 350 [Urine:350]  General appearance: alert, cooperative, appears stated age and no distress Resp: clear to auscultation bilaterally Cardio: regular rate and rhythm, S1, S2 normal, no murmur, click, rub or gallop GI: soft, non-tender; bowel sounds normal; no masses,  no organomegaly  Lab Results:  Recent Labs  01/23/15 2212 01/24/15 0311 01/24/15 0940  WBC 7.0 6.8 6.0  HGB 7.8* 7.4* 8.6*  HCT 23.9* 22.6* 25.7*  PLT 183 157 158   BMET  Recent Labs  01/23/15 1128 01/24/15 0940  NA 143 139  K 3.8 3.7  CL 116* 114*  CO2 19 20  GLUCOSE 163* 156*  BUN 26* 15  CREATININE 0.75 0.71  CALCIUM 8.2* 8.4   LFT  Recent Labs  01/24/15 0940  PROT 4.9*  ALBUMIN 2.6*  AST 33  ALT 17  ALKPHOS 57  BILITOT 0.7   PT/INR  Recent Labs  01/23/15 2212  LABPROT 15.4*  INR 1.20   Studies/Results: Nm Gi Blood Loss  01/23/2015   CLINICAL DATA:  Bloody stools. Evaluate for active GI bleeding. History of anemia and prior GI bleed.  EXAM: NUCLEAR MEDICINE GASTROINTESTINAL BLEEDING  SCAN  TECHNIQUE: Sequential abdominal images were obtained following intravenous administration of Tc-64m labeled red blood cells.  RADIOPHARMACEUTICALS:  25 mCi Tc-35m in-vitro labeled red cells.  COMPARISON:  Tagged red blood cell scan - 01/19/2008; CT abdomen pelvis - 09/24/2012  FINDINGS: A minimal amount of free technetium is seen excreted within the left kidney and within the urinary bladder.  Physiologic activity is seen within the heart and major vascular structures of the abdomen. A minimal of radiotracer is seen within the liver and spleen.  There are no definitive abnormal areas of radiotracer activity which persist or transit within the bowel lumen to suggest ongoing GI bleed.  IMPRESSION: No scintigraphic evidence of ongoing GI bleed.   Electronically Signed   By: Sandi Mariscal M.D.   On: 01/23/2015 20:09   Medications: I have reviewed the patient's current medications.  Assessment/Plan: Anemia s/p rectal bleeding presumed to be due to diverticulosis. Advance her diet as tolerated. Continue to monitor serial CBC's.  LOS: 1 day   Seema Blum 01/24/2015, 1:54 PM

## 2015-01-24 NOTE — Progress Notes (Signed)
Patient requesting Lorrin Mais for sleep at this patient. Patient states that she takes 10mg  ambien at home every night. None currently ordered in patient's MAR. Paged Family medicine , Dr. Alease Frame stated that he will not order at this time, but if patient still requesting at midnight to notify MD. Will continue to monitor patient.

## 2015-01-24 NOTE — Progress Notes (Signed)
Report taken from Truman Hayward, RN on [place]. Pt to be transferred to [facility] [facility].

## 2015-01-25 DIAGNOSIS — I10 Essential (primary) hypertension: Secondary | ICD-10-CM | POA: Diagnosis not present

## 2015-01-25 DIAGNOSIS — E119 Type 2 diabetes mellitus without complications: Secondary | ICD-10-CM | POA: Diagnosis not present

## 2015-01-25 DIAGNOSIS — K5731 Diverticulosis of large intestine without perforation or abscess with bleeding: Secondary | ICD-10-CM | POA: Diagnosis not present

## 2015-01-25 DIAGNOSIS — D62 Acute posthemorrhagic anemia: Secondary | ICD-10-CM | POA: Diagnosis not present

## 2015-01-25 DIAGNOSIS — Z9884 Bariatric surgery status: Secondary | ICD-10-CM | POA: Diagnosis not present

## 2015-01-25 DIAGNOSIS — J45909 Unspecified asthma, uncomplicated: Secondary | ICD-10-CM | POA: Diagnosis not present

## 2015-01-25 DIAGNOSIS — Z8673 Personal history of transient ischemic attack (TIA), and cerebral infarction without residual deficits: Secondary | ICD-10-CM | POA: Diagnosis not present

## 2015-01-25 DIAGNOSIS — I472 Ventricular tachycardia: Secondary | ICD-10-CM | POA: Diagnosis not present

## 2015-01-25 DIAGNOSIS — Z79899 Other long term (current) drug therapy: Secondary | ICD-10-CM | POA: Diagnosis not present

## 2015-01-25 LAB — BASIC METABOLIC PANEL
Anion gap: 6 (ref 5–15)
BUN: 15 mg/dL (ref 6–23)
CO2: 21 mmol/L (ref 19–32)
Calcium: 8.2 mg/dL — ABNORMAL LOW (ref 8.4–10.5)
Chloride: 115 mmol/L — ABNORMAL HIGH (ref 96–112)
Creatinine, Ser: 0.61 mg/dL (ref 0.50–1.10)
GFR calc Af Amer: >90 mL/min (ref >90)
GFR calc non Af Amer: >90 mL/min (ref >90)
Glucose, Bld: 89 mg/dL (ref 70–99)
Potassium: 3.3 mmol/L — ABNORMAL LOW (ref 3.5–5.1)
Sodium: 142 mmol/L (ref 135–145)

## 2015-01-25 LAB — CBC
HCT: 25.9 % — ABNORMAL LOW (ref 36.0–46.0)
Hemoglobin: 8.4 g/dL — ABNORMAL LOW (ref 12.0–15.0)
MCH: 28 pg (ref 26.0–34.0)
MCHC: 32.4 g/dL (ref 30.0–36.0)
MCV: 86.3 fL (ref 78.0–100.0)
Platelets: 163 10*3/uL (ref 150–400)
RBC: 3 MIL/uL — ABNORMAL LOW (ref 3.87–5.11)
RDW: 16.1 % — ABNORMAL HIGH (ref 11.5–15.5)
WBC: 6.2 10*3/uL (ref 4.0–10.5)

## 2015-01-25 MED ORDER — ZOLPIDEM TARTRATE 5 MG PO TABS
5.0000 mg | ORAL_TABLET | Freq: Once | ORAL | Status: AC
  Administered 2015-01-25: 5 mg via ORAL
  Filled 2015-01-25: qty 1

## 2015-01-25 NOTE — Progress Notes (Signed)
Patient transferred from [facility]. Pt alert and oriented x 4., in no distress Skin intact, IV intact. Call bell within reach, bed locked in the lowest position. Patient placed on tele box #25, NSR. Will continue to monitor patient.

## 2015-01-25 NOTE — Progress Notes (Signed)
Family Medicine Teaching Service Daily Progress Note Intern Pager: [phone]  [name] Medical record number: [id number] Date of birth: 1956/02/15 Age: 59 y.o. Gender: female  Primary Care Provider: [name], MD Consultants: GI Code Status: Full  Pt Overview and Major Events to Date:  3/24: GI bleed - likely diverticulosis; transfused 2u pRBC; NM RBC Neg  Assessment and Plan: [name] is a 59 y.o. female presenting with GI bleed. PMH is significant for previous GI bleed; history of diabetes and hypertension; status post gastric bypass surgery.   # GI bleed: BRBPR x 4-5 episodes. Likely due to diverticulosis. Recent colonoscopy with Dr. Deatra Ina in July 2014 found to have one sessile polyp in the sigmoid colon biopsy consistent with an inflammatory polyp. Also had severe diverticulosis universally. Previous GI bleed November 2015; source never identified. status post gastric bypass done in July 2013.  - VSS s/p 3u pRBCs; continue to monitor vitals and transfuse as needed - NM RBC scan negative. PT/INR/PTT wnl - GI following - PPI IV BID - Monitor on telemetry - Monitor CBC in am or sooner if further bleeding or anemia symptoms   # Hx of HTN: BP wnl and HTN medications recently discontinued to low BP. She reports blood pressure has been well controlled status post her gastric bypass.  - Continue to monitor BP   FEN/GI: heart healthy/carb mod, SLIV Prophylaxis: SCDs; No anticoagulation due to acute bleed  Disposition: home pending CBC stable without further bleeding  Subjective:  I old blood stool (maroon colored) this am, denies weakness,dizziness  Objective: Temp:  [97.7 F (36.5 C)-98.8 F (37.1 C)] 97.7 F (36.5 C) (03/26 0539) Pulse Rate:  [71-83] 83 (03/26 0539) Resp:  [11-18] 18 (03/26 0539) BP: (116-130)/(72-79) 130/72 mmHg (03/26 0539) SpO2:  [99 %-100 %] 100 % (03/26 0539) Weight:  [185 lb 1.6 oz (83.961 kg)] 185 lb 1.6 oz (83.961 kg)  (03/25 2120) Physical Exam: Gen: NAD; cooperative with exam Neuro: Alert and oriented, No gross deficits HEENT: NCAT; MMM CV: RRR, no m/r/g; Radial and DP pulse 2+ b/l; Cap refill < 3 sec Resp: CTAB, normal respiratory effort Abd: Mild LLQ tenderness; No guarding or rebounding; No Masses Ext: WWP; trace edema  Laboratory:  Recent Labs Lab 01/24/15 0940 01/24/15 1610 01/25/15 0535  WBC 6.0 7.9 6.2  HGB 8.6* 9.5* 8.4*  HCT 25.7* 28.8* 25.9*  PLT 158 176 163    Recent Labs Lab 01/23/15 1128 01/24/15 0940 01/25/15 0535  NA 143 139 142  K 3.8 3.7 3.3*  CL 116* 114* 115*  CO2 19 20 21   BUN 26* 15 15  CREATININE 0.75 0.71 0.61  CALCIUM 8.2* 8.4 8.2*  PROT 5.4* 4.9*  --   BILITOT 0.2* 0.7  --   ALKPHOS 64 57  --   ALT 15 17  --   AST 25 33  --   GLUCOSE 163* 156* 89   Imaging/Diagnostic Tests: NM RBC scan 3/24: There are no definitive abnormal areas of radiotracer activity which persist or transit within the bowel lumen to suggest ongoing GI bleed.  Frazier Richards, MD 01/25/2015, 9:11 AM PGY-2, [place] Intern pager: [phone], text pages welcome

## 2015-01-25 NOTE — Progress Notes (Signed)
Patient still restless and unable to sleep. MD paged. Orders placed.

## 2015-01-25 NOTE — Progress Notes (Signed)
Called into patient's room. Patient had large maroon-colored bowel movement this morning. Pt without complaints and in no distress.

## 2015-01-25 NOTE — Progress Notes (Signed)
Cross cover [facility]-GI Subjective: Since I last evaluated the patient, she seems to be doing well. She has a bowel movement this morning with passage of some old dark blood. She denies having any dizziness, syncope or near syncope. She has had a regular breakfast this morning and seems to have tolerated her diet well. Hemoglobin slightly down from 9.5 gm/dl yesterday to 8.4 gm/dl today.Unfortunately the bleeding scan was negative and we were not able to localize the source of bleeding.  Objective: Vital signs in last 24 hours: Temp:  [97.7 F (36.5 C)-98.8 F (37.1 C)] 97.7 F (36.5 C) (03/26 0539) Pulse Rate:  [71-83] 83 (03/26 0539) Resp:  [11-18] 18 (03/26 0539) BP: (116-130)/(72-79) 130/72 mmHg (03/26 0539) SpO2:  [99 %-100 %] 100 % (03/26 0539) Weight:  [83.961 kg (185 lb 1.6 oz)] 83.961 kg (185 lb 1.6 oz) (03/25 2120) Last BM Date: 01/23/15  Intake/Output from previous day: 03/25 0701 - 03/26 0700 In: 6314 [P.O.:1230; I.V.:3; Blood:250] Out: 750 [Urine:750] Intake/Output this shift: Total I/O In: -  Out: 400 [Urine:400]  General appearance: alert, cooperative, appears stated age and no distress Resp: clear to auscultation bilaterally Cardio: regular rate and rhythm, S1, S2 normal, no murmur, click, rub or gallop GI: soft, non-tender; bowel sounds normal; no masses,  no organomegaly Extremities: extremities normal, atraumatic, no cyanosis or edema  Lab Results:  Recent Labs  01/24/15 0940 01/24/15 1610 01/25/15 0535  WBC 6.0 7.9 6.2  HGB 8.6* 9.5* 8.4*  HCT 25.7* 28.8* 25.9*  PLT 158 176 163   BMET  Recent Labs  01/23/15 1128 01/24/15 0940 01/25/15 0535  NA 143 139 142  K 3.8 3.7 3.3*  CL 116* 114* 115*  CO2 19 20 21   GLUCOSE 163* 156* 89  BUN 26* 15 15  CREATININE 0.75 0.71 0.61  CALCIUM 8.2* 8.4 8.2*   LFT  Recent Labs  01/24/15 0940  PROT 4.9*  ALBUMIN 2.6*  AST 33  ALT 17  ALKPHOS 57  BILITOT 0.7   PT/INR  Recent Labs  01/23/15 2212   LABPROT 15.4*  INR 1.20   Studies/Results: Nm Gi Blood Loss  01/23/2015   CLINICAL DATA:  Bloody stools. Evaluate for active GI bleeding. History of anemia and prior GI bleed.  EXAM: NUCLEAR MEDICINE GASTROINTESTINAL BLEEDING SCAN  TECHNIQUE: Sequential abdominal images were obtained following intravenous administration of Tc-75m labeled red blood cells.  RADIOPHARMACEUTICALS:  25 mCi Tc-59m in-vitro labeled red cells.  COMPARISON:  Tagged red blood cell scan - 01/19/2008; CT abdomen pelvis - 09/24/2012  FINDINGS: A minimal amount of free technetium is seen excreted within the left kidney and within the urinary bladder.  Physiologic activity is seen within the heart and major vascular structures of the abdomen. A minimal of radiotracer is seen within the liver and spleen.  There are no definitive abnormal areas of radiotracer activity which persist or transit within the bowel lumen to suggest ongoing GI bleed.  IMPRESSION: No scintigraphic evidence of ongoing GI bleed.   Electronically Signed   By: Sandi Mariscal M.D.   On: 01/23/2015 20:09   Medications: I have reviewed the patient's current medications.  Assessment/Plan: Diverticular bleed with anemia: patient seems to be stable at this time. Will watch her for another day and see if she has nay further problems with hematochezia.  LOS: 2 days   Zephyr Sausedo 01/25/2015, 8:23 AM

## 2015-01-26 ENCOUNTER — Encounter (HOSPITAL_COMMUNITY): Payer: Self-pay | Admitting: *Deleted

## 2015-01-26 LAB — CBC
HCT: 23.2 % — ABNORMAL LOW (ref 36.0–46.0)
HCT: 30.5 % — ABNORMAL LOW (ref 36.0–46.0)
Hemoglobin: 7.6 g/dL — ABNORMAL LOW (ref 12.0–15.0)
Hemoglobin: 10.2 g/dL — ABNORMAL LOW (ref 12.0–15.0)
MCH: 28.4 pg (ref 26.0–34.0)
MCH: 28.5 pg (ref 26.0–34.0)
MCHC: 32.8 g/dL (ref 30.0–36.0)
MCHC: 33.4 g/dL (ref 30.0–36.0)
MCV: 86.6 fL (ref 78.0–100.0)
MCV: 85.2 fL (ref 78.0–100.0)
Platelets: 182 10*3/uL (ref 150–400)
Platelets: 208 10*3/uL (ref 150–400)
RBC: 2.68 MIL/uL — ABNORMAL LOW (ref 3.87–5.11)
RBC: 3.58 MIL/uL — ABNORMAL LOW (ref 3.87–5.11)
RDW: 16 % — ABNORMAL HIGH (ref 11.5–15.5)
RDW: 16.3 % — ABNORMAL HIGH (ref 11.5–15.5)
WBC: 7.4 10*3/uL (ref 4.0–10.5)
WBC: 8.5 10*3/uL (ref 4.0–10.5)

## 2015-01-26 LAB — PREPARE RBC (CROSSMATCH)

## 2015-01-26 MED ORDER — ZOLPIDEM TARTRATE 5 MG PO TABS
5.0000 mg | ORAL_TABLET | Freq: Every evening | ORAL | Status: DC | PRN
  Administered 2015-01-27: 5 mg via ORAL
  Filled 2015-01-26: qty 1

## 2015-01-26 MED ORDER — SODIUM CHLORIDE 0.9 % IV SOLN: Freq: Once | INTRAVENOUS | Status: DC

## 2015-01-26 NOTE — Progress Notes (Addendum)
Patient going in and out of vent trigeminy and NSR. Dr. Sherril Cong notified. Patient resting, in no distress. No new orders given. Will continue to monitor patient.

## 2015-01-26 NOTE — Progress Notes (Signed)
RN being notified by CMT that patient had had 18 beats of V-Tach at 1443 without patient being symptomatic. Assessments, vitals and EKG done, and MD notified with the results of the EKG and vitals. No new orders given, will continue to monitor.

## 2015-01-26 NOTE — Progress Notes (Signed)
Family Medicine Teaching Service Daily Progress Note Intern Pager: [phone]  [name] Medical record number: [id number] Date of birth: Jun 02, 1956 Age: 59 y.o. Gender: female  Primary Care Provider: [name], MD Consultants: GI Code Status: Full  Pt Overview and Major Events to Date:  3/24: GI bleed - likely diverticulosis; transfused 2u pRBC; NM RBC Neg  Assessment and Plan: [name] is a 59 y.o. female presenting with GI bleed. PMH is significant for previous GI bleed; history of diabetes and hypertension; status post gastric bypass surgery.   # GI bleed: BRBPR x 4-5 episodes. Likely due to diverticulosis. Recent colonoscopy with Dr. Deatra Ina in July 2014 found to have one sessile polyp in the sigmoid colon biopsy consistent with an inflammatory polyp. Also had severe diverticulosis universally. Previous GI bleed November 2015; source never identified. status post gastric bypass done in July 2013.  - VSS s/p 3u pRBCs; continue to monitor vitals and transfuse as needed - NM RBC scan negative. PT/INR/PTT wnl - GI following - PPI IV BID - Monitor on telemetry - Hgb continues to drop this am, 9.5->8.4->7.6 - Transfuse 2 more units today - F/u GI recs regarding further work-up for bleeding source given hemoglobin continues to drop  # Arrhythmia: 18 beats of vtach on 3/26 and intermittent ventricular trigeminy  - likely related to stress of anemia and GIB - continue to monitor on telemetry  # Hx of HTN: BP wnl and HTN medications recently discontinued to low BP. She reports blood pressure has been well controlled status post her gastric bypass.  - Continue to monitor BP   FEN/GI: heart healthy/carb mod, SLIV Prophylaxis: SCDs; No anticoagulation due to acute bleed  Disposition: home pending CBC stable without further bleeding  Subjective:  No bowel movements since 11am yesterday but still feeling rumbling/gurgling similar to when she was actively  bleeding  Objective: Temp:  [97.8 F (36.6 C)-98.8 F (37.1 C)] 98.4 F (36.9 C) (03/27 0626) Pulse Rate:  [85-87] 87 (03/27 0626) Resp:  [16-20] 20 (03/27 0626) BP: (109-129)/(61-80) 109/64 mmHg (03/27 0626) SpO2:  [100 %] 100 % (03/27 0626) Physical Exam: Gen: NAD; cooperative with exam Neuro: Alert and oriented, No gross deficits HEENT: NCAT; MMM CV: RRR, no m/r/g; Radial and DP pulse 2+ b/l; Cap refill < 3 sec Resp: CTAB, normal respiratory effort Abd: Mild LLQ tenderness; No guarding or rebounding; No Masses Ext: WWP; trace edema  Laboratory:  Recent Labs Lab 01/24/15 1610 01/25/15 0535 01/26/15 0648  WBC 7.9 6.2 7.4  HGB 9.5* 8.4* 7.6*  HCT 28.8* 25.9* 23.2*  PLT 176 163 182    Recent Labs Lab 01/23/15 1128 01/24/15 0940 01/25/15 0535  NA 143 139 142  K 3.8 3.7 3.3*  CL 116* 114* 115*  CO2 19 20 21   BUN 26* 15 15  CREATININE 0.75 0.71 0.61  CALCIUM 8.2* 8.4 8.2*  PROT 5.4* 4.9*  --   BILITOT 0.2* 0.7  --   ALKPHOS 64 57  --   ALT 15 17  --   AST 25 33  --   GLUCOSE 163* 156* 89   Imaging/Diagnostic Tests: NM RBC scan 3/24: There are no definitive abnormal areas of radiotracer activity which persist or transit within the bowel lumen to suggest ongoing GI bleed.  Frazier Richards, MD 01/26/2015, 8:13 AM PGY-2, [facility] Intern pager: [phone], text pages welcome

## 2015-01-27 DIAGNOSIS — K5731 Diverticulosis of large intestine without perforation or abscess with bleeding: Principal | ICD-10-CM

## 2015-01-27 LAB — TYPE AND SCREEN
ABO/RH(D): O POS
ABO/RH(D): O POS
Antibody Screen: NEGATIVE
Antibody Screen: NEGATIVE
Unit division: 0
Unit division: 0
Unit division: 0
Unit division: 0
Unit division: 0
Unit division: 0

## 2015-01-27 LAB — CBC
HCT: 29.8 % — ABNORMAL LOW (ref 36.0–46.0)
Hemoglobin: 9.8 g/dL — ABNORMAL LOW (ref 12.0–15.0)
MCH: 28.7 pg (ref 26.0–34.0)
MCHC: 32.9 g/dL (ref 30.0–36.0)
MCV: 87.4 fL (ref 78.0–100.0)
Platelets: 200 10*3/uL (ref 150–400)
RBC: 3.41 MIL/uL — ABNORMAL LOW (ref 3.87–5.11)
RDW: 16.7 % — ABNORMAL HIGH (ref 11.5–15.5)
WBC: 6.6 10*3/uL (ref 4.0–10.5)

## 2015-01-27 LAB — BASIC METABOLIC PANEL
Anion gap: 5 (ref 5–15)
BUN: 9 mg/dL (ref 6–23)
CO2: 23 mmol/L (ref 19–32)
Calcium: 8.6 mg/dL (ref 8.4–10.5)
Chloride: 115 mmol/L — ABNORMAL HIGH (ref 96–112)
Creatinine, Ser: 0.71 mg/dL (ref 0.50–1.10)
GFR calc Af Amer: >90 mL/min (ref >90)
GFR calc non Af Amer: >90 mL/min (ref >90)
Glucose, Bld: 150 mg/dL — ABNORMAL HIGH (ref 70–99)
Potassium: 3.7 mmol/L (ref 3.5–5.1)
Sodium: 143 mmol/L (ref 135–145)

## 2015-01-27 NOTE — Progress Notes (Signed)
Family Medicine Teaching Service Daily Progress Note Intern Pager: [phone]  [name] Medical record number: [id number] Date of birth: [birth year] Age: 59 y.o. Gender: female  Primary Care Provider: [name], MD Consultants: GI Code Status: Full  Pt Overview and Major Events to Date:  3/24: GI bleed - likely diverticulosis; transfused 2u pRBC; NM RBC Neg  Assessment and Plan: [name] is a 59 y.o. female presenting with GI bleed. PMH is significant for previous GI bleed; history of diabetes and hypertension; status post gastric bypass surgery.   # GI bleed with anemia: BRBPR x 4-5 episodes. Likely due to diverticulosis. Recent colonoscopy with Dr. Deatra Ina in July 2014 found to have one sessile polyp in the sigmoid colon biopsy consistent with an inflammatory polyp. Also had severe diverticulosis universally. Previous GI bleed November 2015; source never identified. status post gastric bypass done in July 2013. Stabilizing.  - VSS s/p 5u pRBCs; continue to monitor vitals and transfuse as needed  - NM RBC scan negative. PT/INR/PTT wnl - PPI IV BID - Monitor on telemetry - Hgb now improved s/p 2U pRBC's yesterday. Post-transfusion Hbg 10.2 and AM Hbg 9.8 - F/u GI recs regarding further work-up for bleeding source given hemoglobin continues to drop.   # Arrhythmia: 18 beats of vtach on 3/26 and intermittent ventricular trigeminy. Resolved.  - likely related to stress of anemia and GIB - continue to monitor on telemetry  # Hx of HTN: BP wnl and HTN medications recently discontinued to low BP. She reports blood pressure has been well controlled status post her gastric bypass.  - Continue to monitor BP   FEN/GI: heart healthy/carb mod, SLIV Prophylaxis: SCDs; No anticoagulation due to acute bleed  Disposition: home pending CBC stable without further bleeding. Possible today.   Subjective:  Patient still endorsing gurgling feeling in her abdomen that usually  indicates continued bleeding. However she states that BM yesterday evening and this morning did not have any gross bleeding. She is stating to have formed stool that is no longer dark in color. Minimal blood around stool but not gross bleeding like prior. Denies abdominal pain.   Objective: Temp:  [98.3 F (36.8 C)-99.1 F (37.3 C)] 98.3 F (36.8 C) (03/28 0510) Pulse Rate:  [85-104] 95 (03/28 0510) Resp:  [16-18] 18 (03/28 0510) BP: (113-129)/(70-83) 117/70 mmHg (03/28 0510) SpO2:  [100 %] 100 % (03/28 0510) Physical Exam: Gen: NAD; cooperative with exam Neuro: Alert and oriented, No gross deficits HEENT: NCAT; MMM CV: RRR, no m/r/g; Radial and DP pulse 2+ b/l; Cap refill < 3 sec Resp: CTAB, normal respiratory effort Abd: Mild LLQ tenderness; No guarding or rebounding; No Masses Ext: WWP; trace edema  Laboratory:  Recent Labs Lab 01/25/15 0535 01/26/15 0648 01/26/15 1908  WBC 6.2 7.4 8.5  HGB 8.4* 7.6* 10.2*  HCT 25.9* 23.2* 30.5*  PLT 163 182 208    Recent Labs Lab 01/23/15 1128 01/24/15 0940 01/25/15 0535  NA 143 139 142  K 3.8 3.7 3.3*  CL 116* 114* 115*  CO2 19 20 21   BUN 26* 15 15  CREATININE 0.75 0.71 0.61  CALCIUM 8.2* 8.4 8.2*  PROT 5.4* 4.9*  --   BILITOT 0.2* 0.7  --   ALKPHOS 64 57  --   ALT 15 17  --   AST 25 33  --   GLUCOSE 163* 156* 89   Imaging/Diagnostic Tests: NM RBC scan 3/24: There are no definitive abnormal areas of radiotracer  activity which persist or transit within the bowel lumen to suggest ongoing GI bleed.  Katheren Shams, DO 01/27/2015, 7:20 AM PGY-1, [place] Intern pager: [phone], text pages welcome

## 2015-01-27 NOTE — Discharge Instructions (Signed)
Discharge Date: 01/27/2015  Reason for Hospitalization: GI bleed  It appears that you had another GI bleed from your diverticular disease that caused severe anemia requiring you to have 5 units of blood. Your bleeding has stabilized and your blood level is appropriate. You have a hospital follow-up appointment scheduled. Please make sure to go to appointment. It is alos important that you follow-up with [facility].   Return to hospital if you have large amount of blood loss or feel symptomatic.  Thank you for letting us participate in your care!

## 2015-01-27 NOTE — Progress Notes (Signed)
          Daily Rounding Note  01/27/2015, 10:12 AM  LOS: 4 days   SUBJECTIVE:       Last BM was 3/27: some older, dark stool with some blood leaching into commode.   Denies chest pain and dizziness.  Feels well.   OBJECTIVE:         Vital signs in last 24 hours:    Temp:  [98.3 F (36.8 C)-99.1 F (37.3 C)] 98.3 F (36.8 C) (03/28 0510) Pulse Rate:  [85-104] 95 (03/28 0510) Resp:  [16-18] 18 (03/28 0510) BP: (113-129)/(70-83) 117/70 mmHg (03/28 0510) SpO2:  [100 %] 100 % (03/28 0510) Last BM Date: 01/27/15 [facility] Weights   01/23/15 2011 01/24/15 2120  Weight: 188 lb 4.4 oz (85.4 kg) 185 lb 1.6 oz (83.961 kg)   General: pleasant, comfortable.  Looks well.    Heart: RRR Chest: clear bil.  No dyspnea or cough Abdomen: soft, NT, ND.  Active BS  Extremities: no CCE Neuro/Psych:  Pleasant, alert/oriented x 3.   Lab Results:  Recent Labs  01/26/15 0648 01/26/15 1908 01/27/15 0915  WBC 7.4 8.5 6.6  HGB 7.6* 10.2* 9.8*  HCT 23.2* 30.5* 29.8*  PLT 182 208 200     Studies/Results: No results found.   Scheduled Meds: . sodium chloride   Intravenous Once  . fluticasone  2 spray Each Nare QHS  . sodium chloride  3 mL Intravenous Q12H   Continuous Infusions:  PRN Meds:.ondansetron, zolpidem   ASSESMENT:   *  LGIB, recurrent diverticular bleed. Several episodes, major and minor volumes hematochezia. Pandiverticulosis on 11/2014 colonoscopy. Sigmoid polyp (inflammatrory) and severe pan-diverticulosis on 2014 Colonoscopy.  Nuc med RBC scan 3/24: no bleeding   *  ABL anemia. S/p PRBCs x 5.    *  Gastric bypass in 05/2012.    PLAN   *  ? Home later today?  *  Stopped IV Protonix.     Azucena Freed  01/27/2015, 10:12 AM Pager: [phone]  [facility] Attending  I have also seen and assessed the patient and agree with the above note. Diverticular bleed appears to be stopping. Looks ready for dc.  Gatha Mayer, MD, [facility] [phone] (pager) 01/27/2015 4:37 PM

## 2015-01-27 NOTE — Progress Notes (Signed)
Per patient, she had one BM overnight. It was formed and brown, no blood noted per patient.

## 2015-01-27 NOTE — Progress Notes (Signed)
[name] to be D/C'd Home per MD order.  Discussed with the patient and all questions fully answered.  VSS, Skin clean, dry and intact without evidence of skin break down, no evidence of skin tears noted. IV catheter discontinued intact. Site without signs and symptoms of complications. Dressing and pressure applied.  An After Visit Summary was printed and given to the patient. Patient received prescription.  D/c education completed with patient/family including follow up instructions, medication list, d/c activities limitations if indicated, with other d/c instructions as indicated by MD - patient able to verbalize understanding, all questions fully answered.   Patient instructed to return to ED, call 911, or call MD for any changes in condition.   Patient escorted via [place], and D/C home via private auto.  Audria Nine F 01/27/2015 5:18 PM

## 2015-01-27 NOTE — Progress Notes (Signed)
Patient requesting Lorrin Mais for sleep. Dr. Raeford Razor notified. Orders placed.

## 2015-01-27 NOTE — Discharge Summary (Signed)
[facility] Discharge Summary  [name] Medical record number: [id number] Date of birth: 05-16-1956 Age: 59 y.o. Gender: female Date of Admission: 01/23/2015  Date of Discharge: 01/27/2015 Admitting Physician: Kinnie Feil, MD  Primary Care Provider: [name], MD Consultants: GI  Indication for Hospitalization: GI bleed causing anemia  Discharge Diagnoses/Problem List:  Patient Active Problem List   Diagnosis Date Noted  . Lower GI bleeding   . Diverticulosis of colon without hemorrhage 11/18/2014  . Blood in stool 10/07/2014  . Pain in joint, shoulder region 07/25/2014  . Muscle cramps 04/22/2014  . Biliary sludge 04/08/2014  . Bunion 03/28/2014  . Foot pain, bilateral 01/11/2014  . Constipation 10/19/2013  . Subacromial bursitis 05/21/2013  . Eczema 05/21/2013  . External hemorrhoids without complication 85/27/7824  . Insomnia 08/29/2012  . GERD (gastroesophageal reflux disease) 08/24/2012  . S/P gastric bypass 06/07/2012  . Asthma, cough variant 03/10/2012  . Diverticulosis of colon with hemorrhage   . Hypopigmented skin lesion 02/23/2011  . 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 Allergic rhinitis 05/27/2010  . POSTMENOPAUSAL STATUS 04/07/2010  . LIBIDO, DECREASED 10/16/2009  . H/O type 2 diabetes mellitus 01/25/2008  . CYST, KIDNEY, ACQUIRED 08/15/2007  . CEREBROVASCULAR ACCIDENT, HX OF 07/26/2007  . HTN, goal below 140/90 05/12/2007  . OBESITY, NOS 12/29/2006  . ANEMIA, IRON DEFICIENCY, UNSPEC. 12/29/2006    Disposition: Home  Discharge Condition: Stable  Discharge Exam:  Filed Vitals:   01/27/15 1427  BP: 129/87  Pulse:   Temp: 99.4 F (37.4 C)  Resp: 16   Gen: NAD; cooperative with exam Neuro: Alert and oriented, No gross deficits HEENT: NCAT; MMM CV: RRR, no m/r/g; Radial and DP pulse 2+ b/l; Cap refill < 3 sec Resp: CTAB, normal respiratory effort Abd: Mild LLQ tenderness; No guarding  or rebounding; No Masses Ext: WWP; trace edema  Brief Hospital Course:  [name] is a 59 y.o. female who presented with GI bleed. PMH is significant for previous GI bleed; history of diabetes and hypertension; status post gastric bypass surgery.   GI bleed with anemia: BRBPR x 4-5 episodes. Likely due to diverticulosis. Recent colonoscopy with Dr. Deatra Ina in July 2014 with severe diverticulosis universally. Previous GI bleed November 2015; source never identified. Initial hemoglobin on admission 7.3 with symptomatic anemia. She was transfused a total of 5 units of packed red blood cells this admission. Vital signs remained stable. A NM RBC scan was negative for source of GIB. PT/INR/PTT within normal limits. GI was consulted and recommended watching. No interventions were done during this admission. Hemoglobin stabilized and bleeding stopped. Of note, patient did have an 18 beat run of V. tach on telemetry. Presumed to be a stress induced arrhythmia from anemia and GI bleed.  The patient's other chronic conditions were stable during this hospitalization and the patient was continued on home medications.   Issues for Follow Up:  1. Repeat CBC; monitor Hbg 2. Outpatient GI follow-up   Significant Procedures: None  Significant Labs and Imaging:   Recent Labs Lab 01/26/15 0648 01/26/15 1908 01/27/15 0915  WBC 7.4 8.5 6.6  HGB 7.6* 10.2* 9.8*  HCT 23.2* 30.5* 29.8*  PLT 182 208 200    Recent Labs Lab 01/23/15 1128 01/24/15 0940 01/25/15 0535 01/27/15 0915  NA 143 139 142 143  K 3.8 3.7 3.3* 3.7  CL 116* 114* 115* 115*  CO2 19 20 21 23   GLUCOSE 163* 156* 89 150*  BUN 26* 15 15 9   CREATININE 0.75 0.71 0.61 0.71  CALCIUM 8.2* 8.4 8.2* 8.6  MG  --  1.9  --   --   PHOS  --  2.7  --   --   ALKPHOS 64 57  --   --   AST 25 33  --   --   ALT 15 17  --   --   ALBUMIN 3.1* 2.6*  --   --    NM RBC scan 3/24: There are no definitive abnormal areas of radiotracer activity which  persist or transit within the bowel lumen to suggest ongoing GI bleed.  Results/Tests Pending at Time of Discharge: None  Discharge Medications:    Medication List    STOP taking these medications        lisinopril-hydrochlorothiazide 20-12.5 MG per tablet  Commonly known as:  PRINZIDE,ZESTORETIC     Phentermine HCl 15 MG Tbdp     ursodiol 300 MG capsule  Commonly known as:  ACTIGALL      TAKE these medications        atorvastatin 40 MG tablet  Commonly known as:  LIPITOR  Take 1 tablet (40 mg total) by mouth daily.     calcium carbonate 600 MG Tabs tablet  Commonly known as:  OS-CAL  Take 1 tablet (600 mg total) by mouth 2 (two) times daily with a meal.     docusate sodium 100 MG capsule  Commonly known as:  COLACE  Take 1 capsule (100 mg total) by mouth 2 (two) times daily as needed. For constipation     ferrous sulfate 220 (44 FE) MG/5ML solution  TAKE 6.90ml DAILY 30 MINUTES BEFORE BREAKFAST- MIX WITH 1/2 GLASS OF ORANGE JUICE     fluticasone 50 MCG/ACT nasal spray  Commonly known as:  FLONASE  Place 2 sprays into both nostrils at bedtime.     multivitamin tablet  Take 1 tablet by mouth daily.     polyethylene glycol powder powder  Commonly known as:  GLYCOLAX/MIRALAX  TAKE 17G BY MOUTH TWICE DAILY     topiramate 50 MG tablet  Commonly known as:  TOPAMAX  Take 25 mg by mouth daily with supper.     zolpidem 10 MG tablet  Commonly known as:  AMBIEN  TAKE ONE TABLET AT BEDTIME AS NEEDED FOR SLEEP        Discharge Instructions: Please refer to Patient Instructions section of EMR for full details.  Patient was counseled important signs and symptoms that should prompt return to medical care, changes in medications, dietary instructions, activity restrictions, and follow up appointments.   Follow-Up Appointments: Follow-up Information    Follow up with [name], MD On 02/04/2015.   Specialty:  Family Medicine   Why:  @8 :30a for hospital follow-up    Contact information:   [place] [id number] [phone]       Rennie Hack Y Shelley Pooley, DO 01/28/2015, 6:10 PM PGY-1, [place]

## 2015-02-03 ENCOUNTER — Ambulatory Visit (INDEPENDENT_AMBULATORY_CARE_PROVIDER_SITE_OTHER): Payer: Commercial Managed Care - HMO | Admitting: Podiatry

## 2015-02-03 DIAGNOSIS — M79671 Pain in right foot: Secondary | ICD-10-CM

## 2015-02-03 DIAGNOSIS — M79672 Pain in left foot: Secondary | ICD-10-CM

## 2015-02-03 DIAGNOSIS — M79673 Pain in unspecified foot: Secondary | ICD-10-CM

## 2015-02-03 DIAGNOSIS — B351 Tinea unguium: Secondary | ICD-10-CM

## 2015-02-03 NOTE — Progress Notes (Signed)
Subjective:     Patient ID: [name], female   DOB: [birth year], 59 y.o.   MRN: [id number]  HPI obese female presents with thick yellow brittle nailbeds 1-5 both feet that she cannot cut and are tender when palpated from a dorsal direction   Review of Systems     Objective:   Physical Exam Neuro vascular status unchanged with thick yellow brittle nailbeds 1-5 both feet that are painful    Assessment:     Mycotic nail infections with pain 1-5 both feet    Plan:     Debride painful nailbeds 1-5 both feet with no iatrogenic bleeding noted

## 2015-02-04 ENCOUNTER — Encounter: Payer: Self-pay | Admitting: Family Medicine

## 2015-02-04 ENCOUNTER — Ambulatory Visit (INDEPENDENT_AMBULATORY_CARE_PROVIDER_SITE_OTHER): Payer: Commercial Managed Care - HMO | Admitting: Family Medicine

## 2015-02-04 VITALS — BP 158/95 | HR 94 | Temp 98.1°F | Ht 64.0 in | Wt 192.6 lb

## 2015-02-04 DIAGNOSIS — D649 Anemia, unspecified: Secondary | ICD-10-CM | POA: Diagnosis not present

## 2015-02-04 DIAGNOSIS — I10 Essential (primary) hypertension: Secondary | ICD-10-CM

## 2015-02-04 DIAGNOSIS — K838 Other specified diseases of biliary tract: Secondary | ICD-10-CM

## 2015-02-04 DIAGNOSIS — K5731 Diverticulosis of large intestine without perforation or abscess with bleeding: Secondary | ICD-10-CM

## 2015-02-04 LAB — POCT HEMOGLOBIN: Hemoglobin: 8.8 g/dL — AB (ref 12.2–16.2)

## 2015-02-04 NOTE — Assessment & Plan Note (Signed)
Discontinued ursodiol  Written by: Dois Davenport, MS3

## 2015-02-04 NOTE — Assessment & Plan Note (Signed)
Discussed benefit of stroke prevention vs. risk of recurrent GI bleed with the patient.  - Joint decision to continue aspirin daily.  Written by: Dois Davenport, MS3

## 2015-02-04 NOTE — Assessment & Plan Note (Addendum)
BP today 134/82. Recent blood loss in setting of GI bleed. - discontinued ACE-inhibitor  Written by: Dois Davenport, MS3  I agree with the above plan. BP controlled of all antihypertensive medications. Monitor at subsequent visits. Dossie Arbour MD

## 2015-02-04 NOTE — Progress Notes (Signed)
Subjective:     Patient ID: [name], female   DOB: 03/30/1956, 59 y.o.   MRN: [id number]  HPI Patient is a 59 year old female with a past medical history significant for diverticulosis who presents today for follow-up after a hospital stay on 01/23/2015 - 01/27/2015 for a GI bleed. Patient received a total of 5 units of PBRC's. Gi was consulted and elected to no perform further diagnostic testing as last Colonoscopy was in January 2016. Bleeding resolved spontaneously and was thought to be due to a diverticular bleed. Today she reports no further blood in her stool and denies abdominal pain, diarrhea, and constipation. She reports feeling weak and easily fatigued since her hospital stay, which is worsened by heightened activity. She denies symptoms of orthostasis. She also notes bilateral leg edema which began in hospital. Compression stockings provided no relief.   She was advised on hospital discharge to increase her iron supplement to 3x per day, but due to her work schedule she is taking two doses. She remains off her ACE-inhibitor, as advised. She has been taking a daily baby asa.   She has yet to follow-up with GI.   Review of Systems Denies chest pain, dyspnea, and palpitations.     Objective:   Physical Exam BP 158/95 mmHg  Pulse 94  Temp(Src) 98.1 F (36.7 C) (Oral)  Ht 5\' 4"  (1.626 m)  Wt 192 lb 9.6 oz (87.363 kg)  BMI 33.04 kg/m2 Note: BP re-checked found to be 134/82  General: well-appearing african americanfemale in no acute distress CV: RRR, no MRG, nl S1 and S2 Pulmonary: CTAB, no increased work of breathing Abdominal: normoactive bowel sounds in 4 quadrants, abdomen soft and nontender, no organomegaly  Ext: 1+ edema in bilateral legs to mid-shin    Assessment:     Please see Problem List.     Plan:     Please see Problem List.     Written by: Dois Davenport, MS3  I personally saw and evaluated the patient. I have reviewed the medical student note and agree  with the documentation. I personally completed the physical exam. Additions to the medical student note are made in blue.   Dossie Arbour MD

## 2015-02-04 NOTE — Patient Instructions (Signed)
Please increase iron supplementation to three times daily, follow up with Dr. Ree Kida in one week to recheck hemoglobin.  Please call to make a follow up with [facility].

## 2015-02-04 NOTE — Assessment & Plan Note (Addendum)
Presents today for follow-up from 01/23/2015 hospital admission. No further blood in stool, but patient endorses generalized weakness. Hemoglobin in office today is 8.8, down from 9.8 on hospital discharge. - encouraged patient to take 3 doses of iron supplement: 2 in morning and 1 in the evening - follow-up in 1 week to re-check hemoglobin - call in case of further dizziness or fatigue  - follow-up with GI at [place]   Bilateral leg edema due to anemia caused by recent GI blood loss. - advised use of compression stockings   I agree with the above assessment and plan.  Dossie Arbour MD

## 2015-02-11 ENCOUNTER — Encounter: Payer: Self-pay | Admitting: Family Medicine

## 2015-02-11 ENCOUNTER — Ambulatory Visit (INDEPENDENT_AMBULATORY_CARE_PROVIDER_SITE_OTHER): Payer: Commercial Managed Care - HMO | Admitting: Family Medicine

## 2015-02-11 VITALS — BP 136/88 | HR 84 | Temp 97.8°F | Ht 64.0 in | Wt 191.0 lb

## 2015-02-11 DIAGNOSIS — K5731 Diverticulosis of large intestine without perforation or abscess with bleeding: Secondary | ICD-10-CM

## 2015-02-11 DIAGNOSIS — I10 Essential (primary) hypertension: Secondary | ICD-10-CM

## 2015-02-11 DIAGNOSIS — I872 Venous insufficiency (chronic) (peripheral): Secondary | ICD-10-CM | POA: Diagnosis not present

## 2015-02-11 DIAGNOSIS — D649 Anemia, unspecified: Secondary | ICD-10-CM

## 2015-02-11 LAB — POCT HEMOGLOBIN: Hemoglobin: 10 g/dL — AB (ref 12.2–16.2)

## 2015-02-11 NOTE — Progress Notes (Signed)
   Subjective:    Patient ID: [name], female    DOB: [birth year], 59 y.o.   MRN: [id number]  HPI 59 y/o female presents for follow up of anemia.  Anemia due to GI bleed - no further episodes of rectal/GI bleeding, fatigue is still present however improved from last visit, normal bowel movements, tolerating diet, no nausea/emesis, no abdominal pain, taking iron supplementation TID  HTN - checks blood pressures at home, ranging 120-130/80's, currently off all BP meds, no chest pain/headaches/vision changes  Leg swelling - improved from last visit, wearing compression stockings during the day  Social - fiance is currently in the hospital for GI bleeding, never a smoker   Review of Systems  Constitutional: Negative for chills and fatigue.  Respiratory: Negative for cough and shortness of breath.   Cardiovascular: Positive for leg swelling. Negative for chest pain.  Gastrointestinal: Negative for nausea, vomiting, abdominal pain and diarrhea.       Objective:   Physical Exam Vitals: reviewed Gen: pleasant female, NAD Cardiac: RRR, S1 and S2 present, no murmur, no heaves/thrills Resp: CTAB, normal effort Abd: soft, no tenderness, normal bowel sounds Ext: 1+ pitting edema to mid shin bilaterally  Reviewed recent lab work: normal renal function, most recent albumin 2.6     Assessment & Plan:  Please see problem specific assessment and plan.

## 2015-02-11 NOTE — Assessment & Plan Note (Signed)
No further episodes of bleeding. Hemoglobin up to 10. -continue iron supplementation

## 2015-02-11 NOTE — Patient Instructions (Addendum)
It was nice to see you today.   Anemia - hemoglobin is up to 10 day, continue iron daily, no need to recheck at this time unless you have bleeding or are feeling more fatigued  Blood Pressure - controlled, please continue to check at home, if the top number gets above 140 or the bottom number gets above 90 please let me know  Leg swelling - appears to be improving, please continue compression stockings. Your low proteins could be contributing to the swelling. Continue protein shakes. Ensure is a good store brand.

## 2015-02-11 NOTE — Assessment & Plan Note (Signed)
Patient has bilateral lower extremity edema. Likely due to combination of venous insufficiency, anemia, and low albumin. Slightly improved with increased hemoglobin. -continue iron supplementation and compression stockings -encouraged protein shakes daily (Ensure)

## 2015-02-11 NOTE — Assessment & Plan Note (Signed)
Blood pressure mildly elevated on first check today. Improved on recheck. Controlled on home readings. -continue to hold BP meds at this time

## 2015-02-21 ENCOUNTER — Telehealth: Payer: Self-pay | Admitting: Family Medicine

## 2015-02-21 NOTE — Telephone Encounter (Signed)
Attempted to contact patient. No answer. Left message to keep nurse appointment and we will reevaluate after her visit.

## 2015-02-21 NOTE — Telephone Encounter (Signed)
For the last week, bp has been running around 149/102. Please advise

## 2015-02-21 NOTE — Telephone Encounter (Signed)
Return call to patient regarding blood pressure.  Pt stated blood pressure has been high for las week.  Pt denies any chest pain, SOB, dizziness, headache, weakness or numbness/tingling of extremities.  Nurse appt for blood pressure check 02/25/2015 at 9:15 AM.  Will forward to PCP for further advise.  Derl Barrow, RN

## 2015-02-25 ENCOUNTER — Ambulatory Visit (INDEPENDENT_AMBULATORY_CARE_PROVIDER_SITE_OTHER): Payer: Commercial Managed Care - HMO | Admitting: Family Medicine

## 2015-02-25 VITALS — BP 158/90 | HR 80

## 2015-02-25 DIAGNOSIS — Z136 Encounter for screening for cardiovascular disorders: Secondary | ICD-10-CM

## 2015-02-25 DIAGNOSIS — Z0130 Encounter for examination of blood pressure without abnormal findings: Secondary | ICD-10-CM

## 2015-02-25 DIAGNOSIS — I10 Essential (primary) hypertension: Secondary | ICD-10-CM

## 2015-02-25 MED ORDER — LISINOPRIL 10 MG PO TABS: 10.0000 mg | ORAL_TABLET | Freq: Every day | ORAL | Status: DC

## 2015-02-25 NOTE — Progress Notes (Signed)
   Pt in nurse clinic for blood pressure check.  Pt BP reading from home at 4:30 AM 156/102 standing and sitting 113/100.  BP 164/92 manually left arm, heart rate 84. BP 158/90 right arm manually, heart rate 80.  Pt stated she had some numbness/tingling sensation in left arm last night, but when woke up this morning she was ok.  Pt denies any numbness/tingling of extremities today, SOB, Chest pian or headaches.  Pt advised if she develops any of the symptoms to call clinic for appt or go to ED. Pt stated understanding.  Derl Barrow, RN

## 2015-02-25 NOTE — Progress Notes (Signed)
Blood pressures remain elevated based on nurse check. Will restart Lisinopril 10 mg daily. I contacted the patient and notified her of this. Follow up BP and BMP at next visit.

## 2015-02-27 ENCOUNTER — Telehealth: Payer: Self-pay | Admitting: Family Medicine

## 2015-02-27 NOTE — Telephone Encounter (Signed)
FYI to [facility] and PCP.

## 2015-02-27 NOTE — Telephone Encounter (Signed)
Pharmacy is sending over some forms Was getting nexium [employer] needs authorization

## 2015-02-28 ENCOUNTER — Telehealth: Payer: Self-pay | Admitting: Family Medicine

## 2015-02-28 NOTE — Telephone Encounter (Signed)
Note to nurse Latina Craver - unclear why on Nexium instead of generic omeprazole from records, this medication appears to have been prescribed by GI, I am unable to reach patient to discuss in detail. For the time being complete the prior auth stating that she continued to have symptoms while on Omeprazole, thanks.

## 2015-02-28 NOTE — Telephone Encounter (Signed)
Pt advised that the generic to Nexium is the same medication.  Pt insurance would only cover the generic.  Verbal order given to pt's pharmacist to fill generic medication.  Pt advised to schedule an appt with PCP for medication follow up if she has concerns with the orders.  Pt stated understanding.  Derl Barrow, RN

## 2015-02-28 NOTE — Telephone Encounter (Signed)
PA for Nexium is pending per [email or web address] [facility]).  Derl Barrow, RN

## 2015-02-28 NOTE — Telephone Encounter (Signed)
Patient had question regarding Nexium. I informed her that my nursing staff has completed a prior authorization which is currently pending.

## 2015-02-28 NOTE — Telephone Encounter (Signed)
Would like for PCP to contact her about her situation because "the nurse does not understand the situation"; it is about her acid reflux. This is all of the information that the patient would give / thanks [employer], ASA

## 2015-03-10 ENCOUNTER — Other Ambulatory Visit: Payer: Self-pay | Admitting: Family Medicine

## 2015-03-10 NOTE — Telephone Encounter (Signed)
Pt called and would like the doctor to call her in something for her congestion. jw

## 2015-03-17 ENCOUNTER — Encounter: Payer: Self-pay | Admitting: Family Medicine

## 2015-03-17 ENCOUNTER — Other Ambulatory Visit: Payer: Self-pay | Admitting: Family Medicine

## 2015-03-17 ENCOUNTER — Ambulatory Visit (INDEPENDENT_AMBULATORY_CARE_PROVIDER_SITE_OTHER): Payer: Commercial Managed Care - HMO | Admitting: Family Medicine

## 2015-03-17 VITALS — BP 150/96 | HR 77 | Temp 98.3°F | Ht 64.0 in | Wt 189.8 lb

## 2015-03-17 DIAGNOSIS — I872 Venous insufficiency (chronic) (peripheral): Secondary | ICD-10-CM

## 2015-03-17 DIAGNOSIS — K219 Gastro-esophageal reflux disease without esophagitis: Secondary | ICD-10-CM

## 2015-03-17 DIAGNOSIS — E119 Type 2 diabetes mellitus without complications: Secondary | ICD-10-CM

## 2015-03-17 DIAGNOSIS — I10 Essential (primary) hypertension: Secondary | ICD-10-CM | POA: Diagnosis not present

## 2015-03-17 DIAGNOSIS — J3089 Other allergic rhinitis: Secondary | ICD-10-CM

## 2015-03-17 LAB — BASIC METABOLIC PANEL
BUN: 12 mg/dL (ref 6–23)
CO2: 26 mEq/L (ref 19–32)
Calcium: 8.7 mg/dL (ref 8.4–10.5)
Chloride: 107 mEq/L (ref 96–112)
Creat: 0.58 mg/dL (ref 0.50–1.10)
Glucose, Bld: 80 mg/dL (ref 70–99)
Potassium: 3.3 mEq/L — ABNORMAL LOW (ref 3.5–5.3)
Sodium: 143 mEq/L (ref 135–145)

## 2015-03-17 LAB — POCT GLYCOSYLATED HEMOGLOBIN (HGB A1C): Hemoglobin A1C: 5.4

## 2015-03-17 MED ORDER — CETIRIZINE HCL 10 MG PO CAPS: 1.0000 | ORAL_CAPSULE | Freq: Every day | ORAL | Status: DC

## 2015-03-17 MED ORDER — ESOMEPRAZOLE MAGNESIUM 40 MG PO CPDR: 40.0000 mg | DELAYED_RELEASE_CAPSULE | Freq: Every day | ORAL | Status: DC

## 2015-03-17 MED ORDER — LISINOPRIL 20 MG PO TABS: 20.0000 mg | ORAL_TABLET | Freq: Every day | ORAL | Status: DC

## 2015-03-17 NOTE — Assessment & Plan Note (Signed)
Patient continues to have runny nose/phlegm likely related to recent viral URI and allergic rhinitis -continue daily Flonase -prescribed Zyrtec, on chart review patient taking Allegra at recent visit however not in medication list/patient did not mention today, will call to verify

## 2015-03-17 NOTE — Telephone Encounter (Signed)
Discussed at [place] 5/16

## 2015-03-17 NOTE — Assessment & Plan Note (Signed)
Patient reports the generic Nexium not working for GERD, taking Nexium from old prescription -sent in new prescription for Nexium (not covered by insurance) -I informed the patient that this may not be covered and that we may need to repeat a prior auth

## 2015-03-17 NOTE — Progress Notes (Signed)
   Subjective:    Patient ID: [name], female    DOB: Feb 24, 1956, 59 y.o.   MRN: [id number]  HPI 59 y/o female presents for follow up of Hypertension.  HTN - recently started on Lisinopril 10 mg daily, no side effects, reports compliance, no headaches, no dizziness, no chest pain, some chronic vision changes (has not been to eye doctor in over a year).  Anemia/diverticulosis - no further bleeding episodes  Leg swelling - improved since last visit, worse at the end of the day, improved with foot elevation  Phlegm/cough for the past few weeks, taking otc mucous medications, reports associated runny nose, no current sore throat, no fevers or chills  GERD - patient still requesting brand name Nexium, states that generic does not work, sent in script for brand name Nexium, informed patient that this may not be covered by her insurance  Social - never a smoker, engaged to be married  Review of Systems  Constitutional: Negative for fever, chills and fatigue.  Eyes: Negative for discharge.  Respiratory: Positive for cough. Negative for chest tightness, shortness of breath and wheezing.   Cardiovascular: Positive for leg swelling. Negative for chest pain.  Gastrointestinal: Positive for abdominal pain. Negative for nausea and diarrhea.       Objective:   Physical Exam Vitals: reviewed Gen: pleasant female, NAD HEENT: normocephalic, PERRL, EOMI, nasal septum midline, MMM, uvula midline, neck supple, no adenopathy Cardiac: RRR, S1 and S2 present, no murmur, no heaves/thrills Resp: CTAB, normal effort Abd: soft, no tenderness, normal bowel sounds Ext: 1+ edema to mid shin     Assessment & Plan:  Please see problem specific assessment and plan.

## 2015-03-17 NOTE — Assessment & Plan Note (Signed)
Elevated today. Home blood pressure mostly controlled with some elevations. -increase Lisinopril to 20 mg daily -check BMP to monitor Cr

## 2015-03-17 NOTE — Assessment & Plan Note (Signed)
Improved from previous visit -continue conservative treatment with elevation/compression -check CBC today to monitor hemoglobin

## 2015-03-17 NOTE — Patient Instructions (Addendum)
High blood pressure - still elevated today, increase Lisinopril to 20 mg daily, Dr. Ree Kida will call you with your lab results  Please schedule a nurse visit for a blood pressure check. Please continue to check your blood pressure at home.   Acid Reflux - refill of Nexium sent to pharmacy  Phlegm/nasal congestion - continue over the counter mucous medication, start daily Zyrtec (allergy medication).

## 2015-03-18 ENCOUNTER — Telehealth: Payer: Self-pay | Admitting: Family Medicine

## 2015-03-18 DIAGNOSIS — E876 Hypokalemia: Secondary | ICD-10-CM | POA: Insufficient documentation

## 2015-03-18 HISTORY — DX: Hypokalemia: E87.6

## 2015-03-18 LAB — CBC WITH DIFFERENTIAL/PLATELET
Basophils Absolute: 0.1 10*3/uL (ref 0.0–0.1)
Basophils Relative: 1 % (ref 0–1)
Eosinophils Absolute: 0.2 10*3/uL (ref 0.0–0.7)
Eosinophils Relative: 4 % (ref 0–5)
HCT: 32.1 % — ABNORMAL LOW (ref 36.0–46.0)
Hemoglobin: 9.9 g/dL — ABNORMAL LOW (ref 12.0–15.0)
Lymphocytes Relative: 32 % (ref 12–46)
Lymphs Abs: 1.7 10*3/uL (ref 0.7–4.0)
MCH: 26.8 pg (ref 26.0–34.0)
MCHC: 30.8 g/dL (ref 30.0–36.0)
MCV: 87 fL (ref 78.0–100.0)
MPV: 9.6 fL (ref 8.6–12.4)
Monocytes Absolute: 0.5 10*3/uL (ref 0.1–1.0)
Monocytes Relative: 9 % (ref 3–12)
Neutro Abs: 2.9 10*3/uL (ref 1.7–7.7)
Neutrophils Relative %: 54 % (ref 43–77)
Platelets: 298 10*3/uL (ref 150–400)
RBC: 3.69 MIL/uL — ABNORMAL LOW (ref 3.87–5.11)
RDW: 15.2 % (ref 11.5–15.5)
WBC: 5.3 10*3/uL (ref 4.0–10.5)

## 2015-03-18 MED ORDER — POTASSIUM CHLORIDE CRYS ER 20 MEQ PO TBCR: EXTENDED_RELEASE_TABLET | ORAL | Status: DC

## 2015-03-18 NOTE — Telephone Encounter (Signed)
Discussed BMP and CBC with patient. Hemoglobin stable. Cr stable. Potassium was low. Sent in [place] 40 mg X1, recheck BMP in 5-7 days.

## 2015-03-24 ENCOUNTER — Ambulatory Visit (INDEPENDENT_AMBULATORY_CARE_PROVIDER_SITE_OTHER): Payer: Commercial Managed Care - HMO | Admitting: Family Medicine

## 2015-03-24 ENCOUNTER — Telehealth: Payer: Self-pay | Admitting: Family Medicine

## 2015-03-24 VITALS — BP 148/90 | HR 80

## 2015-03-24 DIAGNOSIS — Z0130 Encounter for examination of blood pressure without abnormal findings: Secondary | ICD-10-CM

## 2015-03-24 DIAGNOSIS — M25561 Pain in right knee: Secondary | ICD-10-CM

## 2015-03-24 DIAGNOSIS — M25562 Pain in left knee: Principal | ICD-10-CM

## 2015-03-24 DIAGNOSIS — Z136 Encounter for screening for cardiovascular disorders: Secondary | ICD-10-CM

## 2015-03-24 NOTE — Progress Notes (Signed)
Spoke to patient via telephone about elevated BP. Home BP ranging 130's/90's, taking Lisinopril 20 mg daily, she is coming in later this week for a recheck of her potassium, I have asked her to have a nurse recheck her BP at that time, if she remains elevated we will need to increase Lisinopril.

## 2015-03-24 NOTE — Progress Notes (Signed)
   Pt in nurse clinic for blood pressure check.  BP 150/90 manually left arm, heart rate 83.  Pt stated she took blood pressure medications this AM around 8.  Pt denies any symptoms this morning.  BP recheck right arm 148/90 manually, heart rate 80. Will forward to PCP.  Derl Barrow, RN

## 2015-03-24 NOTE — Telephone Encounter (Signed)
Patient is calling because she needs a referral to see Dr. Kathryne Hitch at [facility]. Address: [address], [place], [place]; [phone]. Please contact the patient once this is complete. Thank you,Sadie Reynolds, ASA

## 2015-03-25 NOTE — Telephone Encounter (Signed)
Attempted to contact patient to clarify the need for referral, left voicemail to return my call.

## 2015-03-26 NOTE — Telephone Encounter (Signed)
Patient has had bilateral knee arthoplasty in the past, had a change in insurance and need referral before she can return to [facility], I will place the referral today

## 2015-03-28 ENCOUNTER — Other Ambulatory Visit: Payer: Commercial Managed Care - HMO

## 2015-03-28 ENCOUNTER — Telehealth: Payer: Self-pay | Admitting: Family Medicine

## 2015-03-28 ENCOUNTER — Ambulatory Visit (INDEPENDENT_AMBULATORY_CARE_PROVIDER_SITE_OTHER): Payer: Commercial Managed Care - HMO | Admitting: *Deleted

## 2015-03-28 VITALS — BP 138/90 | HR 79

## 2015-03-28 DIAGNOSIS — E876 Hypokalemia: Secondary | ICD-10-CM | POA: Diagnosis not present

## 2015-03-28 DIAGNOSIS — Z0130 Encounter for examination of blood pressure without abnormal findings: Secondary | ICD-10-CM

## 2015-03-28 DIAGNOSIS — Z136 Encounter for screening for cardiovascular disorders: Secondary | ICD-10-CM

## 2015-03-28 LAB — BASIC METABOLIC PANEL
BUN: 15 mg/dL (ref 6–23)
CO2: 25 mEq/L (ref 19–32)
Calcium: 9.1 mg/dL (ref 8.4–10.5)
Chloride: 107 mEq/L (ref 96–112)
Creat: 0.63 mg/dL (ref 0.50–1.10)
Glucose, Bld: 78 mg/dL (ref 70–99)
Potassium: 4 mEq/L (ref 3.5–5.3)
Sodium: 137 mEq/L (ref 135–145)

## 2015-03-28 NOTE — Progress Notes (Signed)
   Pt in nurse clinic for blood pressure check. BP 138/90 manually, heart rate 79.  Pt denies any symptoms.  Lab work completed today.  Will forward to PCP.  Derl Barrow, RN

## 2015-03-28 NOTE — Progress Notes (Signed)
BMP DONE TODAY Gerell Fortson 

## 2015-03-28 NOTE — Telephone Encounter (Signed)
LMOVM for patient to return call. Please advise pt that she has an appt with Dr. Kathryne Hitch at [facility] on 5/31 @ 10:30am.  Address: [address], [place], [place] [address] [phone]

## 2015-04-01 ENCOUNTER — Telehealth: Payer: Self-pay | Admitting: Family Medicine

## 2015-04-01 ENCOUNTER — Telehealth: Payer: Self-pay | Admitting: Physician Assistant

## 2015-04-01 ENCOUNTER — Encounter: Payer: Self-pay | Admitting: Family Medicine

## 2015-04-01 NOTE — Telephone Encounter (Signed)
[employer] auth. Currently suspended ID #[id number]

## 2015-04-01 NOTE — Telephone Encounter (Signed)
Dr Kadakia-cardiologist Pt has an appt 04-14-15 at 10 Needs a referral

## 2015-04-01 NOTE — Telephone Encounter (Signed)
Patient reports light bleeding in stool (started last evening), very slight. Patient has contacted GI and has appointment on Wednesday. Counseled that if bleeding picks up she will need to go to the ER for further evaluation.

## 2015-04-01 NOTE — Telephone Encounter (Signed)
Pt called because she would like to speak to Dr. Ree Kida concerning her bleeding that has started again. jw

## 2015-04-01 NOTE — Telephone Encounter (Signed)
Patient is having blood with her bowel movement and some loose stools. She has LLQ abdominal pain. States it reminds her of the last time she had diverticulitis. Appointment made for 04/02/15

## 2015-04-02 ENCOUNTER — Other Ambulatory Visit (INDEPENDENT_AMBULATORY_CARE_PROVIDER_SITE_OTHER): Payer: Commercial Managed Care - HMO

## 2015-04-02 ENCOUNTER — Encounter: Payer: Self-pay | Admitting: Physician Assistant

## 2015-04-02 ENCOUNTER — Ambulatory Visit (INDEPENDENT_AMBULATORY_CARE_PROVIDER_SITE_OTHER): Payer: Commercial Managed Care - HMO | Admitting: Physician Assistant

## 2015-04-02 VITALS — BP 144/84 | HR 88 | Ht 64.0 in | Wt 191.0 lb

## 2015-04-02 DIAGNOSIS — K5731 Diverticulosis of large intestine without perforation or abscess with bleeding: Secondary | ICD-10-CM

## 2015-04-02 DIAGNOSIS — K625 Hemorrhage of anus and rectum: Secondary | ICD-10-CM

## 2015-04-02 LAB — CBC WITH DIFFERENTIAL/PLATELET
Basophils Absolute: 0 10*3/uL (ref 0.0–0.1)
Basophils Relative: 0.3 % (ref 0.0–3.0)
Eosinophils Absolute: 0.2 10*3/uL (ref 0.0–0.7)
Eosinophils Relative: 3.1 % (ref 0.0–5.0)
HCT: 27.2 % — ABNORMAL LOW (ref 36.0–46.0)
Hemoglobin: 8.7 g/dL — ABNORMAL LOW (ref 12.0–15.0)
Lymphocytes Relative: 24.4 % (ref 12.0–46.0)
Lymphs Abs: 1.8 10*3/uL (ref 0.7–4.0)
MCHC: 31.9 g/dL (ref 30.0–36.0)
MCV: 84 fl (ref 78.0–100.0)
Monocytes Absolute: 0.7 10*3/uL (ref 0.1–1.0)
Monocytes Relative: 9.3 % (ref 3.0–12.0)
Neutro Abs: 4.7 10*3/uL (ref 1.4–7.7)
Neutrophils Relative %: 62.9 % (ref 43.0–77.0)
Platelets: 270 10*3/uL (ref 150.0–400.0)
RBC: 3.24 Mil/uL — ABNORMAL LOW (ref 3.87–5.11)
RDW: 15.9 % — ABNORMAL HIGH (ref 11.5–15.5)
WBC: 7.4 10*3/uL (ref 4.0–10.5)

## 2015-04-02 NOTE — Progress Notes (Signed)
Patient ID: [name], female   DOB: [birth year], 59 y.o.   MRN: [id number]   Subjective:    Patient ID: [name], female    DOB: 18-Nov-1955, 59 y.o.   MRN: [id number]  HPI [name] is a very nice 59 year old African-American female known to Dr. Deatra Ina. She has history of gastric bypass in 2013, has remote history of a TIA for which she stays on baby aspirin, adult-onset diabetes mellitus, hypertension, and has had recurrent diverticular bleeding. She has required hospitalization on a couple of occasions in the past. She was last seen in December 2015 with a low-grade bleed which did not require hospitalization. She then had colonoscopy in January 2016 which showed moderate diffuse diverticulosis and an otherwise negative exam. Patient comes in today after another episode of bleeding onset on Monday, 03/31/2015. She says bleeding started about 9 PM and she had 3 episodes of bright red blood per rectum. She said the bleeding was not as heavy as what she has had in the past. She had 2 episodes yesterday with smaller amounts of blood and has not seen any blood today. She says she always has some mild discomfort with these episodes but no significant pain. She has not had any nausea or vomiting lightheadedness dizziness etc. She has held her baby aspirin since Monday. No regular NSAID use and no blood thinners.  Review of Systems Pertinent positive and negative review of systems were noted in the above HPI section.  All other review of systems was otherwise negative.  Outpatient Encounter Prescriptions as of 04/02/2015  Medication Sig  . aspirin 81 MG tablet Take 81 mg by mouth daily.  Marland Kitchen atorvastatin (LIPITOR) 40 MG tablet Take 1 tablet (40 mg total) by mouth daily.  . calcium carbonate (OS-CAL) 600 MG TABS tablet Take 1 tablet (600 mg total) by mouth 2 (two) times daily with a meal.  . Cetirizine HCl (ZYRTEC ALLERGY) 10 MG CAPS Take 1 capsule (10 mg total) by mouth daily.  Marland Kitchen docusate sodium (COLACE)  100 MG capsule Take 1 capsule (100 mg total) by mouth 2 (two) times daily as needed. For constipation  . esomeprazole (NEXIUM) 40 MG capsule Take 1 capsule (40 mg total) by mouth daily.  . ferrous sulfate 220 (44 FE) MG/5ML solution TAKE 6.62ml DAILY 30 MINUTES BEFORE BREAKFAST- MIX WITH 1/2 GLASS OF ORANGE JUICE  . fluticasone (FLONASE) 50 MCG/ACT nasal spray Place 2 sprays into both nostrils at bedtime.  Marland Kitchen lisinopril (PRINIVIL,ZESTRIL) 20 MG tablet Take 1 tablet (20 mg total) by mouth daily.  . Multiple Vitamin (MULTIVITAMIN) tablet Take 1 tablet by mouth daily.  . polyethylene glycol powder (GLYCOLAX/MIRALAX) powder TAKE 17G BY MOUTH TWICE DAILY  . potassium chloride SA (K-DUR,KLOR-CON) 20 MEQ tablet Take two tablets for a one time dose.  . topiramate (TOPAMAX) 50 MG tablet Take 25 mg by mouth daily with supper.   . Vitamins A & D 5000-400 UNITS CAPS Take 1 tablet by mouth daily.  Marland Kitchen zolpidem (AMBIEN) 10 MG tablet TAKE ONE TABLET AT BEDTIME AS NEEDED FOR SLEEP   No facility-administered encounter medications on file as of 04/02/2015.   Allergies  Allergen Reactions  . Aspirin Other (See Comments)    GI BLEED  . Nsaids Other (See Comments)    GI BLEED   Patient Active Problem List   Diagnosis Date Noted  . Hypokalemia 03/18/2015  . Lower GI bleeding   . Diverticulosis of colon without hemorrhage 11/18/2014  . Blood in stool  10/07/2014  . Pain in joint, shoulder region 07/25/2014  . Muscle cramps 04/22/2014  . Biliary sludge 04/08/2014  . Bunion 03/28/2014  . Foot pain, bilateral 01/11/2014  . Constipation 10/19/2013  . Subacromial bursitis 05/21/2013  . Eczema 05/21/2013  . External hemorrhoids without complication 87/56/4332  . Insomnia 08/29/2012  . GERD (gastroesophageal reflux disease) 08/24/2012  . S/P gastric bypass 06/07/2012  . Asthma, cough variant 03/10/2012  . Diverticulosis of colon with hemorrhage   . Hypopigmented skin lesion 02/23/2011  . DE QUERVAIN'S  TENOSYNOVITIS 11/30/2010  . Allergic rhinitis 05/27/2010  . POSTMENOPAUSAL STATUS 04/07/2010  . LIBIDO, DECREASED 10/16/2009  . H/O type 2 diabetes mellitus 01/25/2008  . CYST, KIDNEY, ACQUIRED 08/15/2007  . CEREBROVASCULAR ACCIDENT, HX OF 07/26/2007  . HTN, goal below 140/90 05/12/2007  . OBESITY, NOS 12/29/2006  . ANEMIA, IRON DEFICIENCY, UNSPEC. 12/29/2006  . Venous (peripheral) insufficiency 12/29/2006   History   Social History  . Marital Status: Divorced    Spouse Name: N/A  . Number of Children: N/A  . Years of Education: N/A   Occupational History  . Not on file.   Social History Main Topics  . Smoking status: Never Smoker   . Smokeless tobacco: Never Used  . Alcohol Use: Yes     Comment: occasional wine  . Drug Use: No  . Sexual Activity: Not on file   Other Topics Concern  . Not on file   Social History Narrative    Ms. Reffett's family history includes Cancer (age of onset: 67) in her father; Diabetes in her brother, brother, brother, brother, mother, sister, and sister; Heart disease in her sister; Hypertension in her mother; Stroke in her brother and sister.      Objective:    Filed Vitals:   04/02/15 1543  BP: 144/84  Pulse:     Physical Exam  well-developed older African-American female in no acute distress, quite pleasant blood pressure 144/84 pulse in the 70s. HEENT; nontraumatic normocephalic EOMI PERRLA sclera anicteric, Supple;no JVD, Cardiovascular; regular rate and rhythm with S1-S2 no murmur or gallop, Pulmonary; clear bilaterally, Abdomen ;soft minimally tender bilateral lower quadrants there is no guarding or rebound no palpable mass or hepatosplenomegaly bowel sounds are present, Rectal; exam not done Extremities ;no clubbing cyanosis or edema skin warm and dry, Psych ;mood and affect appropriate       Assessment & Plan:   #1 59 yo female with history of recurrent diverticular bleeding and known diffuse diverticulosis who presents with  another 2 day self -limited diverticular bleed which is now resolving. #2Status post gastric bypass #3 history of TIA #4 hypertension #5 adult-onset diabetes mellitus  Plan; observe as bleeding is resolving CBC today Hold ASA through this week then resume  Pt knows to call for any evidence of recurrent bleeding and /or proceed to ER for more vigorous bleed   Devonte Migues Genia Harold PA-C 04/02/2015   Cc: [name], MD

## 2015-04-02 NOTE — Patient Instructions (Addendum)
Please go to the basement level to have your labs drawn.  Hold aspirin until next week. Call us if you have any more bleeding.      Normal BMI (Body Mass Index- based on height and weight) is between 19 and 25. Your BMI today is Body mass index is 32.77 kg/(m^2). Marland Kitchen Please consider follow up  regarding your BMI with your Primary Care Provider.

## 2015-04-03 NOTE — Progress Notes (Signed)
Reviewed and agree with management. Robert D. Kaplan, M.D., FACG  

## 2015-04-04 ENCOUNTER — Other Ambulatory Visit: Payer: Self-pay

## 2015-04-04 DIAGNOSIS — D62 Acute posthemorrhagic anemia: Secondary | ICD-10-CM

## 2015-04-07 ENCOUNTER — Other Ambulatory Visit: Payer: Commercial Managed Care - HMO

## 2015-04-10 ENCOUNTER — Other Ambulatory Visit: Payer: Commercial Managed Care - HMO

## 2015-04-10 DIAGNOSIS — D62 Acute posthemorrhagic anemia: Secondary | ICD-10-CM

## 2015-04-10 LAB — CBC WITH DIFFERENTIAL/PLATELET
Basophils Absolute: 0 10*3/uL (ref 0.0–0.1)
Basophils Relative: 0 % (ref 0–1)
Eosinophils Absolute: 0.3 10*3/uL (ref 0.0–0.7)
Eosinophils Relative: 4 % (ref 0–5)
HCT: 26.4 % — ABNORMAL LOW (ref 36.0–46.0)
Hemoglobin: 8.3 g/dL — ABNORMAL LOW (ref 12.0–15.0)
Lymphocytes Relative: 31 % (ref 12–46)
Lymphs Abs: 2 10*3/uL (ref 0.7–4.0)
MCH: 26.1 pg (ref 26.0–34.0)
MCHC: 31.4 g/dL (ref 30.0–36.0)
MCV: 83 fL (ref 78.0–100.0)
MPV: 9.1 fL (ref 8.6–12.4)
Monocytes Absolute: 0.5 10*3/uL (ref 0.1–1.0)
Monocytes Relative: 8 % (ref 3–12)
Neutro Abs: 3.6 10*3/uL (ref 1.7–7.7)
Neutrophils Relative %: 57 % (ref 43–77)
Platelets: 325 10*3/uL (ref 150–400)
RBC: 3.18 MIL/uL — ABNORMAL LOW (ref 3.87–5.11)
RDW: 15.1 % (ref 11.5–15.5)
WBC: 6.3 10*3/uL (ref 4.0–10.5)

## 2015-04-22 ENCOUNTER — Other Ambulatory Visit (INDEPENDENT_AMBULATORY_CARE_PROVIDER_SITE_OTHER): Payer: Commercial Managed Care - HMO

## 2015-04-22 DIAGNOSIS — D62 Acute posthemorrhagic anemia: Secondary | ICD-10-CM

## 2015-04-22 LAB — CBC WITH DIFFERENTIAL/PLATELET
Basophils Absolute: 0 10*3/uL (ref 0.0–0.1)
Basophils Relative: 0.5 % (ref 0.0–3.0)
Eosinophils Absolute: 0.2 10*3/uL (ref 0.0–0.7)
Eosinophils Relative: 3.8 % (ref 0.0–5.0)
HCT: 25.9 % — ABNORMAL LOW (ref 36.0–46.0)
Hemoglobin: 8.1 g/dL — ABNORMAL LOW (ref 12.0–15.0)
Lymphocytes Relative: 37.1 % (ref 12.0–46.0)
Lymphs Abs: 2 10*3/uL (ref 0.7–4.0)
MCHC: 31.2 g/dL (ref 30.0–36.0)
MCV: 81.6 fl (ref 78.0–100.0)
Monocytes Absolute: 0.5 10*3/uL (ref 0.1–1.0)
Monocytes Relative: 8.7 % (ref 3.0–12.0)
Neutro Abs: 2.7 10*3/uL (ref 1.4–7.7)
Neutrophils Relative %: 49.9 % (ref 43.0–77.0)
Platelets: 281 10*3/uL (ref 150.0–400.0)
RBC: 3.18 Mil/uL — ABNORMAL LOW (ref 3.87–5.11)
RDW: 16 % — ABNORMAL HIGH (ref 11.5–15.5)
WBC: 5.3 10*3/uL (ref 4.0–10.5)

## 2015-04-23 ENCOUNTER — Ambulatory Visit (INDEPENDENT_AMBULATORY_CARE_PROVIDER_SITE_OTHER): Payer: Commercial Managed Care - HMO | Admitting: *Deleted

## 2015-04-23 DIAGNOSIS — Z111 Encounter for screening for respiratory tuberculosis: Secondary | ICD-10-CM | POA: Diagnosis not present

## 2015-04-23 NOTE — Progress Notes (Signed)
   Tuberculin skin test applied to left ventral forearm. Pt to return in 48-72 hours for readings.  Appt Friday 04/25/15 at 3 PM.  Derl Barrow, RN

## 2015-04-25 ENCOUNTER — Other Ambulatory Visit: Payer: Self-pay

## 2015-04-25 ENCOUNTER — Ambulatory Visit (INDEPENDENT_AMBULATORY_CARE_PROVIDER_SITE_OTHER): Payer: Commercial Managed Care - HMO | Admitting: *Deleted

## 2015-04-25 DIAGNOSIS — Z7689 Persons encountering health services in other specified circumstances: Secondary | ICD-10-CM

## 2015-04-25 DIAGNOSIS — D6489 Other specified anemias: Secondary | ICD-10-CM

## 2015-04-25 DIAGNOSIS — Z111 Encounter for screening for respiratory tuberculosis: Secondary | ICD-10-CM

## 2015-04-25 LAB — TB SKIN TEST
Induration: 0 mm
TB Skin Test: NEGATIVE

## 2015-04-25 NOTE — Telephone Encounter (Signed)
Patient agrees to go to the lab today.

## 2015-04-25 NOTE — Progress Notes (Signed)
   PPD Reading Note PPD read and results entered in EpicCare. Result: 0 mm induration. Interpretation: Negative If test not read within 48-72 hours of initial placement, patient advised to repeat in other arm 1-3 weeks after this test. Allergic reaction: no  Martin, Tamika L, RN  

## 2015-04-25 NOTE — Telephone Encounter (Signed)
-----   Message from Inda Castle, MD sent at 04/24/2015  4:29 PM EDT ----- She needs repeat FOBT ----- Message -----    From: Greggory Keen, LPN    Sent: 07/26/9323   3:50 PM      To: Inda Castle, MD  She is not seeing any actual blood. She varies from normal stool to diarrhea. She is on iron BID. Her only complain is fatigue and feeling sleepy. Repeat lab 04/29/15.

## 2015-05-01 ENCOUNTER — Telehealth: Payer: Self-pay | Admitting: Family Medicine

## 2015-05-01 ENCOUNTER — Other Ambulatory Visit: Payer: Self-pay | Admitting: *Deleted

## 2015-05-01 ENCOUNTER — Other Ambulatory Visit (INDEPENDENT_AMBULATORY_CARE_PROVIDER_SITE_OTHER): Payer: Commercial Managed Care - HMO

## 2015-05-01 DIAGNOSIS — K625 Hemorrhage of anus and rectum: Secondary | ICD-10-CM

## 2015-05-01 DIAGNOSIS — D6489 Other specified anemias: Secondary | ICD-10-CM

## 2015-05-01 LAB — FECAL OCCULT BLOOD, IMMUNOCHEMICAL: Fecal Occult Bld: NEGATIVE

## 2015-05-01 NOTE — Telephone Encounter (Signed)
Pt called because she is having cramps in her legs. This just started yesterday 6/29 and she would like the doctor to call in something. jw

## 2015-05-01 NOTE — Telephone Encounter (Signed)
Pt stated the leg cramps happen throughout the day.  Pt stated last night it was so bad that it woke her up out of her sleep and she was crying.  Pt has tried OTC medication with no relief, she also has tried heating pad with no relief.  Will forward to PCP for further advise.  Derl Barrow, RN

## 2015-05-02 ENCOUNTER — Ambulatory Visit: Payer: Commercial Managed Care - HMO | Admitting: Family Medicine

## 2015-05-02 ENCOUNTER — Ambulatory Visit (INDEPENDENT_AMBULATORY_CARE_PROVIDER_SITE_OTHER): Payer: Commercial Managed Care - HMO | Admitting: Family Medicine

## 2015-05-02 ENCOUNTER — Encounter: Payer: Self-pay | Admitting: Family Medicine

## 2015-05-02 VITALS — BP 137/84 | HR 91 | Temp 98.9°F | Ht 64.0 in | Wt 193.2 lb

## 2015-05-02 DIAGNOSIS — R252 Cramp and spasm: Secondary | ICD-10-CM

## 2015-05-02 LAB — BASIC METABOLIC PANEL WITH GFR
BUN: 14 mg/dL (ref 6–23)
CO2: 24 mEq/L (ref 19–32)
Calcium: 8.6 mg/dL (ref 8.4–10.5)
Chloride: 110 mEq/L (ref 96–112)
Creat: 0.69 mg/dL (ref 0.50–1.10)
GFR, Est African American: >89 mL/min
GFR, Est Non African American: >89 mL/min
Glucose, Bld: 81 mg/dL (ref 70–99)
Potassium: 3.7 mEq/L (ref 3.5–5.3)
Sodium: 146 mEq/L — ABNORMAL HIGH (ref 135–145)

## 2015-05-02 NOTE — Telephone Encounter (Signed)
Spoke with pt and she was schedule for an appt this afternoon on same day schedule. [name], CMA

## 2015-05-02 NOTE — Progress Notes (Signed)
Patient ID: [name], female   DOB: December 06, 1955, 59 y.o.   MRN: [id number]   [facility] Aquilla Hacker, MD Phone: [phone]  Subjective:   # Leg Cramps - Occurring two times / night for the last two days.  - Pt. Has been swimming more and is in a structured swimming program.  - She has been trying to drink plenty of gatorade.  - She has not had cramps any other time.  - She has not been stretching very well after swimming.  - She does not have any leg swelling or edema.  - She has not had cramps any where else.  - She describes her cramps as a "Charlie Horse".  - She is not on lasix, or HCTZ - She has had low potassium once before.   All relevant systems were reviewed and were negative unless otherwise noted in the HPI  Past Medical History Reviewed problem list.  Medications- reviewed and updated Current Outpatient Prescriptions  Medication Sig Dispense Refill  . aspirin 81 MG tablet Take 81 mg by mouth daily.    Marland Kitchen atorvastatin (LIPITOR) 40 MG tablet Take 1 tablet (40 mg total) by mouth daily. 90 tablet 3  . calcium carbonate (OS-CAL) 600 MG TABS tablet Take 1 tablet (600 mg total) by mouth 2 (two) times daily with a meal. 180 tablet 1  . Cetirizine HCl (ZYRTEC ALLERGY) 10 MG CAPS Take 1 capsule (10 mg total) by mouth daily. 30 capsule 2  . docusate sodium (COLACE) 100 MG capsule Take 1 capsule (100 mg total) by mouth 2 (two) times daily as needed. For constipation 180 capsule 1  . esomeprazole (NEXIUM) 40 MG capsule Take 1 capsule (40 mg total) by mouth daily. 30 capsule 3  . ferrous sulfate 220 (44 FE) MG/5ML solution TAKE 6.49ml DAILY 30 MINUTES BEFORE BREAKFAST- MIX WITH 1/2 GLASS OF ORANGE JUICE 240 mL 2  . fluticasone (FLONASE) 50 MCG/ACT nasal spray Place 2 sprays into both nostrils at bedtime. 16 g 6  . lisinopril (PRINIVIL,ZESTRIL) 20 MG tablet Take 1 tablet (20 mg total) by mouth daily. 30 tablet 2  . Multiple Vitamin (MULTIVITAMIN) tablet  Take 1 tablet by mouth daily. 90 tablet 1  . polyethylene glycol powder (GLYCOLAX/MIRALAX) powder TAKE 17G BY MOUTH TWICE DAILY 850 g 1  . potassium chloride SA (K-DUR,KLOR-CON) 20 MEQ tablet Take two tablets for a one time dose. 2 tablet 0  . topiramate (TOPAMAX) 50 MG tablet Take 25 mg by mouth daily with supper.     . Vitamins A & D 5000-400 UNITS CAPS Take 1 tablet by mouth daily.    Marland Kitchen zolpidem (AMBIEN) 10 MG tablet TAKE ONE TABLET AT BEDTIME AS NEEDED FOR SLEEP 30 tablet 1   No current facility-administered medications for this visit.   Chief complaint-noted No additions to family history Social history- patient is a non smoker  Objective: BP 137/84 mmHg  Pulse 91  Temp(Src) 98.9 F (37.2 C) (Oral)  Ht 5\' 4"  (1.626 m)  Wt 193 lb 4 oz (87.658 kg)  BMI 33.16 kg/m2 Gen: NAD, alert, cooperative with exam HEENT: NCAT, EOMI, PERRL Neck: FROM, supple CV: RRR, good S1/S2, no murmur Resp: CTABL, no wheezes, non-labored Abd: SNTND, BS present, no guarding or organomegaly Ext: No edema, warm, normal tone, moves UE/LE spontaneously, Mild TTP of BL lower calfs. No asymmetry or swelling noted. No erythema.  Neuro: Alert and oriented, No gross deficits Skin: no rashes no lesions  Assessment/Plan:   # Leg Cramps - Likely benign cramps related to transient dehydration / electrolyte abnormalities / exercise.  - BMET today.  - Advised to continue drinking Gatorade, eating bananas.  - Over the counter magnesium supplements  - Adequate hydration and stretching before and after exercise.  - Follow up as needed.

## 2015-05-02 NOTE — Patient Instructions (Signed)
Leg Cramps Leg cramps that occur during exercise can be caused by poor circulation or dehydration. However, muscle cramps that occur at rest or during the night are usually not due to any serious medical problem. Heat cramps may cause muscle spasms during hot weather.  CAUSES There is no clear cause for muscle cramps. However, dehydration may be a factor for those who do not drink enough fluids and those who exercise in the heat. Imbalances in the level of sodium, potassium, calcium or magnesium in the muscle tissue may also be a factor. Some medications, such as water pills (diuretics), may cause loss of chemicals that the body needs (like sodium and potassium) and cause muscle cramps. TREATMENT   Make sure your diet has enough fluids and essential minerals for the muscle to work normally.  Avoid strenuous exercise for several days if you have been having frequent leg cramps.  Stretch and massage the cramped muscle for several minutes.  Some medicines may be helpful in some patients with night cramps. Only take over-the-counter or prescription medicines as directed by your caregiver. SEEK IMMEDIATE MEDICAL CARE IF:   Your leg cramps become worse.  Your foot becomes cold, numb, or blue. Document Released: 11/25/2004 Document Revised: 01/10/2012 Document Reviewed: 11/12/2008 [facility] Patient Information 2015 [place], [place]. This information is not intended to replace advice given to you by your health care provider. Make sure you discuss any questions you have with your health care provider.   Thanks for letting us take care of you!   You can use Natural Calm, or Cal-Mag supplements to help with cramps.   Continue to drink plenty of gatorade, water, and electrolytes.   Stretch well before and after swimming.   Sincerely,  Paula Compton, MD Family Medicine - PGY 2

## 2015-05-02 NOTE — Telephone Encounter (Signed)
Nursing staff - contact patient and offer same day appointment. Thanks.

## 2015-05-08 ENCOUNTER — Encounter: Payer: Self-pay | Admitting: Family Medicine

## 2015-05-08 ENCOUNTER — Telehealth: Payer: Self-pay | Admitting: Family Medicine

## 2015-05-08 NOTE — Telephone Encounter (Signed)
Attempted to call x 2 with normal lab results and no answer. Will send letter.   CGM MD

## 2015-05-10 NOTE — Progress Notes (Signed)
Quick Note:  Please inform the patient that lab work was normal(stool hemeoccult card was negaive. No further GI workup. ______

## 2015-05-12 ENCOUNTER — Inpatient Hospital Stay (HOSPITAL_COMMUNITY)
Admission: EM | Admit: 2015-05-12 | Discharge: 2015-05-14 | DRG: 378 | Disposition: A | Discharge status: Home / Self Care | Payer: Commercial Managed Care - HMO | Attending: Family Medicine | Admitting: Family Medicine

## 2015-05-12 ENCOUNTER — Encounter (HOSPITAL_COMMUNITY): Payer: Self-pay | Admitting: Emergency Medicine

## 2015-05-12 DIAGNOSIS — M1990 Unspecified osteoarthritis, unspecified site: Secondary | ICD-10-CM | POA: Diagnosis present

## 2015-05-12 DIAGNOSIS — E119 Type 2 diabetes mellitus without complications: Secondary | ICD-10-CM | POA: Diagnosis not present

## 2015-05-12 DIAGNOSIS — J45909 Unspecified asthma, uncomplicated: Secondary | ICD-10-CM | POA: Diagnosis not present

## 2015-05-12 DIAGNOSIS — K219 Gastro-esophageal reflux disease without esophagitis: Secondary | ICD-10-CM | POA: Diagnosis present

## 2015-05-12 DIAGNOSIS — Z886 Allergy status to analgesic agent status: Secondary | ICD-10-CM | POA: Diagnosis not present

## 2015-05-12 DIAGNOSIS — I10 Essential (primary) hypertension: Secondary | ICD-10-CM | POA: Diagnosis present

## 2015-05-12 DIAGNOSIS — Z6832 Body mass index (BMI) 32.0-32.9, adult: Secondary | ICD-10-CM | POA: Diagnosis not present

## 2015-05-12 DIAGNOSIS — K5731 Diverticulosis of large intestine without perforation or abscess with bleeding: Principal | ICD-10-CM | POA: Diagnosis present

## 2015-05-12 DIAGNOSIS — K5791 Diverticulosis of intestine, part unspecified, without perforation or abscess with bleeding: Secondary | ICD-10-CM

## 2015-05-12 DIAGNOSIS — I872 Venous insufficiency (chronic) (peripheral): Secondary | ICD-10-CM | POA: Diagnosis present

## 2015-05-12 DIAGNOSIS — E780 Pure hypercholesterolemia: Secondary | ICD-10-CM | POA: Diagnosis present

## 2015-05-12 DIAGNOSIS — Z9884 Bariatric surgery status: Secondary | ICD-10-CM

## 2015-05-12 DIAGNOSIS — Z7951 Long term (current) use of inhaled steroids: Secondary | ICD-10-CM

## 2015-05-12 DIAGNOSIS — Z8673 Personal history of transient ischemic attack (TIA), and cerebral infarction without residual deficits: Secondary | ICD-10-CM

## 2015-05-12 DIAGNOSIS — D649 Anemia, unspecified: Secondary | ICD-10-CM | POA: Diagnosis not present

## 2015-05-12 DIAGNOSIS — Z96653 Presence of artificial knee joint, bilateral: Secondary | ICD-10-CM | POA: Diagnosis present

## 2015-05-12 DIAGNOSIS — R079 Chest pain, unspecified: Secondary | ICD-10-CM | POA: Diagnosis present

## 2015-05-12 DIAGNOSIS — E669 Obesity, unspecified: Secondary | ICD-10-CM | POA: Diagnosis present

## 2015-05-12 DIAGNOSIS — Z7982 Long term (current) use of aspirin: Secondary | ICD-10-CM | POA: Diagnosis not present

## 2015-05-12 DIAGNOSIS — K625 Hemorrhage of anus and rectum: Secondary | ICD-10-CM | POA: Diagnosis not present

## 2015-05-12 DIAGNOSIS — K922 Gastrointestinal hemorrhage, unspecified: Secondary | ICD-10-CM | POA: Diagnosis not present

## 2015-05-12 DIAGNOSIS — Z90711 Acquired absence of uterus with remaining cervical stump: Secondary | ICD-10-CM | POA: Diagnosis present

## 2015-05-12 DIAGNOSIS — D62 Acute posthemorrhagic anemia: Secondary | ICD-10-CM | POA: Diagnosis not present

## 2015-05-12 DIAGNOSIS — K644 Residual hemorrhoidal skin tags: Secondary | ICD-10-CM | POA: Diagnosis present

## 2015-05-12 DIAGNOSIS — E785 Hyperlipidemia, unspecified: Secondary | ICD-10-CM | POA: Diagnosis present

## 2015-05-12 HISTORY — DX: Chest pain, unspecified: R07.9

## 2015-05-12 LAB — CBC
HCT: 21.4 % — ABNORMAL LOW (ref 36.0–46.0)
HCT: 23.9 % — ABNORMAL LOW (ref 36.0–46.0)
Hemoglobin: 6.6 g/dL — CL (ref 12.0–15.0)
Hemoglobin: 7.7 g/dL — ABNORMAL LOW (ref 12.0–15.0)
MCH: 24.2 pg — ABNORMAL LOW (ref 26.0–34.0)
MCH: 25.9 pg — ABNORMAL LOW (ref 26.0–34.0)
MCHC: 30.8 g/dL (ref 30.0–36.0)
MCHC: 32.2 g/dL (ref 30.0–36.0)
MCV: 78.4 fL (ref 78.0–100.0)
MCV: 80.5 fL (ref 78.0–100.0)
Platelets: 264 10*3/uL (ref 150–400)
Platelets: 225 10*3/uL (ref 150–400)
RBC: 2.73 MIL/uL — ABNORMAL LOW (ref 3.87–5.11)
RBC: 2.97 MIL/uL — ABNORMAL LOW (ref 3.87–5.11)
RDW: 15.9 % — ABNORMAL HIGH (ref 11.5–15.5)
RDW: 15.6 % — ABNORMAL HIGH (ref 11.5–15.5)
WBC: 5.2 10*3/uL (ref 4.0–10.5)
WBC: 5.6 10*3/uL (ref 4.0–10.5)

## 2015-05-12 LAB — COMPREHENSIVE METABOLIC PANEL
ALT: 28 U/L (ref 14–54)
AST: 30 U/L (ref 15–41)
Albumin: 2.9 g/dL — ABNORMAL LOW (ref 3.5–5.0)
Alkaline Phosphatase: 68 U/L (ref 38–126)
Anion gap: 7 (ref 5–15)
BUN: 18 mg/dL (ref 6–20)
CO2: 21 mmol/L — ABNORMAL LOW (ref 22–32)
Calcium: 8 mg/dL — ABNORMAL LOW (ref 8.9–10.3)
Chloride: 112 mmol/L — ABNORMAL HIGH (ref 101–111)
Creatinine, Ser: 0.61 mg/dL (ref 0.44–1.00)
GFR calc Af Amer: >60 mL/min (ref >60)
GFR calc non Af Amer: >60 mL/min (ref >60)
Glucose, Bld: 117 mg/dL — ABNORMAL HIGH (ref 65–99)
Potassium: 3.6 mmol/L (ref 3.5–5.1)
Sodium: 140 mmol/L (ref 135–145)
Total Bilirubin: 0.4 mg/dL (ref 0.3–1.2)
Total Protein: 5.5 g/dL — ABNORMAL LOW (ref 6.5–8.1)

## 2015-05-12 LAB — MRSA PCR SCREENING: MRSA by PCR: NEGATIVE

## 2015-05-12 LAB — PROTIME-INR
INR: 1.18 (ref 0.00–1.49)
Prothrombin Time: 15.1 seconds (ref 11.6–15.2)

## 2015-05-12 LAB — TROPONIN I
Troponin I: <0.03 ng/mL (ref <0.031)
Troponin I: <0.03 ng/mL (ref <0.031)
Troponin I: <0.03 ng/mL (ref <0.031)

## 2015-05-12 LAB — PREPARE RBC (CROSSMATCH)

## 2015-05-12 MED ORDER — ONDANSETRON HCL 4 MG PO TABS: 4.0000 mg | ORAL_TABLET | Freq: Four times a day (QID) | ORAL | Status: DC | PRN

## 2015-05-12 MED ORDER — ZOLPIDEM TARTRATE 5 MG PO TABS
10.0000 mg | ORAL_TABLET | Freq: Every evening | ORAL | Status: DC | PRN
  Administered 2015-05-12 – 2015-05-13 (×2): 10 mg via ORAL
  Filled 2015-05-12 (×2): qty 2

## 2015-05-12 MED ORDER — LISINOPRIL 20 MG PO TABS
20.0000 mg | ORAL_TABLET | Freq: Every day | ORAL | Status: DC
  Administered 2015-05-13 – 2015-05-14 (×2): 20 mg via ORAL
  Filled 2015-05-12 (×2): qty 1

## 2015-05-12 MED ORDER — ACETAMINOPHEN 650 MG RE SUPP: 650.0000 mg | Freq: Four times a day (QID) | RECTAL | Status: DC | PRN

## 2015-05-12 MED ORDER — SODIUM CHLORIDE 0.9 % IV SOLN
Freq: Once | INTRAVENOUS | Status: AC
  Administered 2015-05-12: 09:00:00 via INTRAVENOUS

## 2015-05-12 MED ORDER — LORATADINE 10 MG PO TABS
10.0000 mg | ORAL_TABLET | Freq: Every day | ORAL | Status: DC
  Administered 2015-05-12 – 2015-05-14 (×3): 10 mg via ORAL
  Filled 2015-05-12 (×3): qty 1

## 2015-05-12 MED ORDER — CALCIUM CARBONATE ANTACID 500 MG PO CHEW
500.0000 mg | CHEWABLE_TABLET | Freq: Two times a day (BID) | ORAL | Status: DC
  Administered 2015-05-13 – 2015-05-14 (×3): 500 mg via ORAL
  Filled 2015-05-12 (×5): qty 1

## 2015-05-12 MED ORDER — CALCIUM CARBONATE 600 MG PO TABS
600.0000 mg | ORAL_TABLET | Freq: Two times a day (BID) | ORAL | Status: DC
  Filled 2015-05-12: qty 1

## 2015-05-12 MED ORDER — PANTOPRAZOLE SODIUM 40 MG IV SOLR: 40.0000 mg | Freq: Two times a day (BID) | INTRAVENOUS | Status: DC

## 2015-05-12 MED ORDER — SODIUM CHLORIDE 0.9 % IJ SOLN
3.0000 mL | Freq: Two times a day (BID) | INTRAMUSCULAR | Status: DC
  Administered 2015-05-12 – 2015-05-14 (×4): 3 mL via INTRAVENOUS

## 2015-05-12 MED ORDER — ATORVASTATIN CALCIUM 40 MG PO TABS
40.0000 mg | ORAL_TABLET | Freq: Every day | ORAL | Status: DC
  Administered 2015-05-13 – 2015-05-14 (×2): 40 mg via ORAL
  Filled 2015-05-12 (×2): qty 1

## 2015-05-12 MED ORDER — ONDANSETRON HCL 4 MG/2ML IJ SOLN: 4.0000 mg | Freq: Four times a day (QID) | INTRAMUSCULAR | Status: DC | PRN

## 2015-05-12 MED ORDER — ACETAMINOPHEN 325 MG PO TABS
650.0000 mg | ORAL_TABLET | Freq: Four times a day (QID) | ORAL | Status: DC | PRN
  Administered 2015-05-13 – 2015-05-14 (×2): 650 mg via ORAL
  Filled 2015-05-12 (×2): qty 2

## 2015-05-12 NOTE — Consult Note (Signed)
[facility] Consult: 1:03 PM 05/12/2015  LOS: 0 days    Referring Provider: Dr McDiarmid  Primary Care Physician:  Hannah Dawn, MD Primary Gastroenterologist:  Dr. Deatra Ina     Reason for Consultation:  Hematochezia.    HPI: [name] is a 59 y.o. female.  Hx CVA, htn, obesity.  S/p bariatric gastric bypass 2013.  Hx recurrent LGIBs due to diverticulosis dating back to at least 2001, with several admissions for this. Last admission was 01/23/2015.  Tagged RBC bleeding scan on 01/23/15: negative.    Bleeding scan in 12/2007 + for bleeding at splenic flexure.  However the mesenteric angiogram was negative.  11/08/2014 Colonoscopy, for hematochezia, at [facility]: Moderate pan-diverticulosis.  05/2013 Colonoscopy, for hematochezia, personal hx colon adenoma and fm hx of colon cancer:  Severe pan-diverticulosis.  Sessile sigmoid polyp removed, path: inflammatory polyp.  Home meds include 81 ASA, nexium, miralax.   2001 Colonoscopy, Dr Oletta Lamas, for hematochezia: pan-diverticulosis.   Starting 9 PM last night, recurrent large volume hematochezia every 60 to 90 minutes. Ceased for a while around 0600 today, last episode was ~ noon today. No abdominal pain.  A little nausea (per usual with LGIB).  + DOE and somewhat light headed but no presyncope or fainting. Currently feels well.  No NSAID except the ASA.   Seen in GI office 04/02/15 for limited hematochezia, held her ASA for a week. Hgb 8.7on 6/1, and 8.1 on 6/21, compared to 10 in mid 01/2015. Today Hgb is 6.6 and second of 2 PRBCs just hung.    Past Medical History  Diagnosis Date  . Anemia   . Mini stroke   . GERD (gastroesophageal reflux disease)   . Arthritis     knees  . Hypertension     under control with meds., has been on med. > 20 yr.  . High cholesterol    . Shoulder impingement 08/2013    right  . Rotator cuff rupture, complete 08/2013    right  . Degenerative arthritis of right shoulder region 08/2013  . Wears dentures     upper  . Wears partial dentures     lower  . History of GI bleed   . Back pain 10/04/2012  . Stroke 2006    Remote left lacunar infarct noted on CT head 2006, 2010     Past Surgical History  Procedure Laterality Date  . 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 Abdominal hysterectomy      partial  . Total knee arthroplasty Left   . Total knee arthroplasty Right   . Colonoscopy  05/02/2000; 05/08/2013  . Shoulder arthroscopy with rotator cuff repair and subacromial decompression Right 08/09/2013    Procedure: RIGHT SHOULDER ARTHROSCOPY WITH SUBACROMIAL DECOMPRESSION, DISTAL CLAVICLE EXCISION AND ROTATOR CUFF REPAIR and release biceps;  Surgeon: Ninetta Lights, MD;  Location: [facility];  Service: Orthopedics;  Laterality: Right;  . Cosmetic surgery  02/22/2014    skin removal surgery from lower abdomen     Prior to Admission medications   Medication  Sig Start Date End Date Taking? Authorizing Provider  aspirin 81 MG tablet Take 81 mg by mouth daily.   Yes Historical Provider, MD  atorvastatin (LIPITOR) 40 MG tablet Take 1 tablet (40 mg total) by mouth daily. 08/19/14  Yes Bernadene Bell, MD  calcium carbonate (OS-CAL) 600 MG TABS tablet Take 1 tablet (600 mg total) by mouth 2 (two) times daily with a meal. 01/07/14  Yes Josalyn Funches, MD  Cetirizine HCl (ZYRTEC ALLERGY) 10 MG CAPS Take 1 capsule (10 mg total) by mouth daily. 03/17/15  Yes Hannah Dawn, MD  docusate sodium (COLACE) 100 MG capsule Take 1 capsule (100 mg total) by mouth 2 (two) times daily as needed. For constipation 08/19/14  Yes Bernadene Bell, MD  esomeprazole (NEXIUM) 40 MG capsule Take 1 capsule (40 mg total) by mouth daily. 03/17/15  Yes Hannah Dawn, MD  ferrous sulfate 220 (44 FE) MG/5ML solution Take 268.4 mg by mouth 2 (two)  times daily with a meal. 6.9ml twice daily   Yes Historical Provider, MD  fluticasone (FLONASE) 50 MCG/ACT nasal spray Place 2 sprays into both nostrils at bedtime. 01/07/14  Yes Josalyn Funches, MD  lisinopril (PRINIVIL,ZESTRIL) 20 MG tablet Take 1 tablet (20 mg total) by mouth daily. 03/17/15  Yes Hannah Dawn, MD  Multiple Vitamin (MULTIVITAMIN) tablet Take 1 tablet by mouth daily. 01/07/14  Yes Josalyn Funches, MD  polyethylene glycol powder (GLYCOLAX/MIRALAX) powder TAKE 17G BY MOUTH TWICE DAILY 11/07/14  Yes Hannah Dawn, MD  potassium chloride SA (K-DUR,KLOR-CON) 20 MEQ tablet Take two tablets for a one time dose. 03/18/15  Yes Hannah Dawn, MD  topiramate (TOPAMAX) 50 MG tablet Take 50 mg by mouth daily with supper.  12/26/14 12/26/15 Yes Historical Provider, MD  Vitamins A & D 5000-400 UNITS CAPS Take 1 tablet by mouth daily. 02/18/15  Yes Historical Provider, MD  zolpidem (AMBIEN) 10 MG tablet TAKE ONE TABLET AT BEDTIME AS NEEDED FOR SLEEP 12/26/14  Yes Hannah Dawn, MD    Scheduled Meds: . [START ON 05/13/2015] atorvastatin  40 mg Oral Daily  . [START ON 05/13/2015] calcium carbonate  500 mg Oral BID WC  . [START ON 05/13/2015] lisinopril  20 mg Oral Daily  . loratadine  10 mg Oral Daily  . pantoprazole (PROTONIX) IV  40 mg Intravenous Q12H  . sodium chloride  3 mL Intravenous Q12H   Infusions:   PRN Meds: acetaminophen **OR** acetaminophen, ondansetron **OR** ondansetron (ZOFRAN) IV, zolpidem   Allergies as of 05/12/2015 - Review Complete 05/12/2015  Allergen Reaction Noted  . Aspirin Other (See Comments) 05/12/2007  . Nsaids Other (See Comments) 07/24/2009    Family History  Problem Relation Age of Onset  . Cancer Father 66    Died from complications of colon CA  . Diabetes Mother   . Hypertension Mother   . Diabetes Sister   . Heart disease Sister   . Diabetes Sister   . Stroke Sister   . Diabetes Brother   . Stroke Brother   . Diabetes Brother   . Diabetes Brother   .  Diabetes Brother     History   Social History  . Marital Status: Divorced    Spouse Name: N/A  . Number of Children: N/A  . Years of Education: N/A   Occupational History  . Not on file.   Social History Main Topics  . Smoking status: Never Smoker   . Smokeless tobacco: Never Used  .  Alcohol Use: Yes     Comment: occasional wine  . Drug Use: No  . Sexual Activity: Not on file   Other Topics Concern  . Not on file   Social History Narrative    REVIEW OF SYSTEMS: Constitutional:  Per HPI.  Generally no weakness or easy fatigueability. ENT:  No nose bleeds Pulm:  Per HPI CV:  No palpitations, + LE edema if stands for a long time.  No chest pain.  GU:  No hematuria, no frequency GI:  pe HPI Heme:  Per HPI   Transfusions:  Previous transfusions 01/28/15 (#4), 07/2011(#1) Neuro:  No headaches, no peripheral tingling or numbness Derm:  No itching, no rash or sores.  Endocrine:  No sweats or chills.  No polyuria or dysuria Immunization:  Reviewed.  Hep B completed Travel:  None beyond local counties in last few months.    PHYSICAL EXAM: Vital signs in last 24 hours: Filed Vitals:   05/12/15 1231  BP: 144/83  Pulse: 80  Temp: 98.5 F (36.9 C)  Resp: 18   Wt Readings from Last 3 Encounters:  05/12/15 188 lb (85.276 kg)  05/02/15 193 lb 4 oz (87.658 kg)  04/02/15 191 lb (86.637 kg)   General: very pleasant, looks well and comfortable Head:  No asymmetry or swelling  Eyes:  No icterus or pallor.  Ears:  Not HOH  Nose:  No discharge Mouth:  Clear, moist.  Upper full denture, many teeth in lower jaw missing. Neck:  No mass, no TMG, no JVD, no TMG Lungs:  Clear bil.  No labored resps or cough.  Heart: RRR.  No mrg.  S1/s2 audible Abdomen:  Soft, NT, ND, no mass, no HSM.  Active BS.   Rectal: deferred.    Musc/Skeltl: no joint swelling or deformity.  Extremities:  Slight, non-pitting pedal swelling  Neurologic:  Oriented x 3.  Excellent recall of details.  No  tremor.  No limb weakness.  No gross deficits Skin:  No rash, no sores Tattoos:  none Nodes:  No cervical or inguinal adenopathy.    Psych:  Pleasant.  Calm.  Engaged.   Intake/Output from previous day:   Intake/Output this shift: Total I/O In: 338 [Blood:338] Out: -   LAB RESULTS:  Recent Labs  05/12/15 0703  WBC 5.2  HGB 6.6*  HCT 21.4*  PLT 264   BMET Lab Results  Component Value Date   NA 140 05/12/2015   NA 146* 05/02/2015   NA 137 03/28/2015   K 3.6 05/12/2015   K 3.7 05/02/2015   K 4.0 03/28/2015   CL 112* 05/12/2015   CL 110 05/02/2015   CL 107 03/28/2015   CO2 21* 05/12/2015   CO2 24 05/02/2015   CO2 25 03/28/2015   GLUCOSE 117* 05/12/2015   GLUCOSE 81 05/02/2015   GLUCOSE 78 03/28/2015   BUN 18 05/12/2015   BUN 14 05/02/2015   BUN 15 03/28/2015   CREATININE 0.61 05/12/2015   CREATININE 0.69 05/02/2015   CREATININE 0.63 03/28/2015   CALCIUM 8.0* 05/12/2015   CALCIUM 8.6 05/02/2015   CALCIUM 9.1 03/28/2015   LFT  Recent Labs  05/12/15 0703  PROT 5.5*  ALBUMIN 2.9*  AST 30  ALT 28  ALKPHOS 68  BILITOT 0.4   PT/INR Lab Results  Component Value Date   INR 1.18 05/12/2015   INR 1.20 01/23/2015   INR 1.08 07/02/2011    RADIOLOGY STUDIES: No results found.  ENDOSCOPIC STUDIES: Per HPI.  IMPRESSION:   *  Recurrent lower GI bleed in pt with numerous previous admits for diverticular bleeding dating back to 2001.  In 2009 had = bleeding scan but negative angiogram.  Bleeding scan negative 12/2014.    *  ABL anemia.   Previous transfusions 01/28/15 (#4), 07/2011(#1)  *  S/p Roux-en-Y gastric bypass 2013. Total of 140# weight loss.     PLAN:     *  If rebleeds, send for nuc RBC scan.  Ok to have clears. Stop Protonix.    Azucena Freed  05/12/2015, 1:03 PM Pager: [phone]

## 2015-05-12 NOTE — ED Notes (Signed)
Pt reports sudden onset of rectal bleeding yesterday night at 9pm; pt describes bleeding as "a lot of bright red blood"; pt reports hx of same

## 2015-05-12 NOTE — Progress Notes (Signed)
Received pt from ED. CHG bath done & MRSA swab.

## 2015-05-12 NOTE — ED Provider Notes (Addendum)
CSN: [id number]     Arrival date & time 05/12/15  0645 History   First MD Initiated Contact with Patient 05/12/15 0700     Chief Complaint  Patient presents with  . Rectal Bleeding     (Consider location/radiation/quality/duration/timing/severity/associated sxs/prior Treatment) HPI Comments: Patient presents with rectal bleeding. She has a history of diverticular bleeds in the past. She sees Dr. Deatra Ina. She takes a baby aspirin but is not on other anticoagulants. She states that her rectal bleeding started last night. She's had bright red blood filling the toilet about every hour. She feels lightheaded and a little bit short of breath. She's had some nausea but no vomiting. She denies any chest pain. She denies any abdominal pain. She denies any fevers or chills. She states the bleeding has been constant and unchanged since it started.  Patient is a 58 y.o. female presenting with hematochezia.  Rectal Bleeding Associated symptoms: light-headedness   Associated symptoms: no abdominal pain, no dizziness, no fever and no vomiting     Past Medical History  Diagnosis Date  . Anemia   . Mini stroke   . GERD (gastroesophageal reflux disease)   . Arthritis     knees  . Hypertension     under control with meds., has been on med. > 20 yr.  . High cholesterol   . Shoulder impingement 08/2013    right  . Rotator cuff rupture, complete 08/2013    right  . Degenerative arthritis of right shoulder region 08/2013  . Wears dentures     upper  . Wears partial dentures     lower  . History of GI bleed   . Back pain 10/04/2012  . Stroke 2006    Remote left lacunar infarct noted on CT head 2006, 2010    Past Surgical History  Procedure Laterality Date  . 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 Abdominal hysterectomy      partial  . Total knee arthroplasty Left   . Total knee arthroplasty Right   . Colonoscopy  05/02/2000; 05/08/2013  . Shoulder arthroscopy with rotator cuff repair and  subacromial decompression Right 08/09/2013    Procedure: RIGHT SHOULDER ARTHROSCOPY WITH SUBACROMIAL DECOMPRESSION, DISTAL CLAVICLE EXCISION AND ROTATOR CUFF REPAIR and release biceps;  Surgeon: Ninetta Lights, MD;  Location: [facility];  Service: Orthopedics;  Laterality: Right;  . Cosmetic surgery  02/22/2014    skin removal surgery from lower abdomen    Family History  Problem Relation Age of Onset  . Cancer Father 34    Died from complications of colon CA  . Diabetes Mother   . Hypertension Mother   . Diabetes Sister   . Heart disease Sister   . Diabetes Sister   . Stroke Sister   . Diabetes Brother   . Stroke Brother   . Diabetes Brother   . Diabetes Brother   . Diabetes Brother    History  Substance Use Topics  . Smoking status: Never Smoker   . Smokeless tobacco: Never Used  . Alcohol Use: Yes     Comment: occasional wine   OB History    No data available     Review of Systems  Constitutional: Positive for fatigue. Negative for fever, chills and diaphoresis.  HENT: Negative for congestion, rhinorrhea and sneezing.   Eyes: Negative.   Respiratory: Positive for shortness of breath. Negative for cough and chest tightness.   Cardiovascular: Negative for chest pain and leg swelling.  Gastrointestinal:  Positive for nausea, blood in stool and hematochezia. Negative for vomiting, abdominal pain and diarrhea.  Genitourinary: Negative for frequency, hematuria, flank pain and difficulty urinating.  Musculoskeletal: Negative for back pain and arthralgias.  Skin: Negative for rash.  Neurological: Positive for light-headedness. Negative for dizziness, speech difficulty, weakness, numbness and headaches.      Allergies  Aspirin and Nsaids  Home Medications   Prior to Admission medications   Medication Sig Start Date End Date Taking? Authorizing Provider  aspirin 81 MG tablet Take 81 mg by mouth daily.    Historical Provider, MD  atorvastatin (LIPITOR) 40  MG tablet Take 1 tablet (40 mg total) by mouth daily. 08/19/14   Bernadene Bell, MD  calcium carbonate (OS-CAL) 600 MG TABS tablet Take 1 tablet (600 mg total) by mouth 2 (two) times daily with a meal. 01/07/14   Boykin Nearing, MD  Cetirizine HCl (ZYRTEC ALLERGY) 10 MG CAPS Take 1 capsule (10 mg total) by mouth daily. 03/17/15   [name], MD  docusate sodium (COLACE) 100 MG capsule Take 1 capsule (100 mg total) by mouth 2 (two) times daily as needed. For constipation 08/19/14   Bernadene Bell, MD  esomeprazole (NEXIUM) 40 MG capsule Take 1 capsule (40 mg total) by mouth daily. 03/17/15   [name], MD  ferrous sulfate 220 (44 FE) MG/5ML solution TAKE 6.43ml DAILY 30 MINUTES BEFORE BREAKFAST- MIX WITH 1/2 GLASS OF ORANGE JUICE 01/03/15   [name], MD  fluticasone [facility]) 50 MCG/ACT nasal spray Place 2 sprays into both nostrils at bedtime. 01/07/14   Josalyn Funches, MD  lisinopril (PRINIVIL,ZESTRIL) 20 MG tablet Take 1 tablet (20 mg total) by mouth daily. 03/17/15   [name], MD  Multiple Vitamin (MULTIVITAMIN) tablet Take 1 tablet by mouth daily. 01/07/14   Josalyn Funches, MD  polyethylene glycol powder (GLYCOLAX/MIRALAX) powder TAKE 17G BY MOUTH TWICE DAILY 11/07/14   [name], MD  potassium chloride SA (K-DUR,KLOR-CON) 20 MEQ tablet Take two tablets for a one time dose. 03/18/15   [name], MD  topiramate (TOPAMAX) 50 MG tablet Take 25 mg by mouth daily with supper.  12/26/14 12/26/15  Historical Provider, MD  Vitamins A & D 5000-400 UNITS CAPS Take 1 tablet by mouth daily. 02/18/15   Historical Provider, MD  zolpidem (AMBIEN) 10 MG tablet TAKE ONE TABLET AT BEDTIME AS NEEDED FOR SLEEP 12/26/14   [name], MD   BP 132/73 mmHg  Pulse 79  Temp(Src) 97.9 F (36.6 C)  Resp 19  Ht 5\' 4"  (1.626 m)  Wt 188 lb (85.276 kg)  BMI 32.25 kg/m2  SpO2 100% Physical Exam  Constitutional: She is oriented to person, place, and time. She appears well-developed and well-nourished.   HENT:  Head: Normocephalic and atraumatic.  Eyes: Pupils are equal, round, and reactive to light.  Neck: Normal range of motion. Neck supple.  Cardiovascular: Normal rate, regular rhythm and normal heart sounds.   Pulmonary/Chest: Effort normal and breath sounds normal. No respiratory distress. She has no wheezes. She has no rales. She exhibits no tenderness.  Abdominal: Soft. Bowel sounds are normal. There is no tenderness. There is no rebound and no guarding.  Genitourinary:  Grossly bloody stool in rectal exam  Musculoskeletal: Normal range of motion. She exhibits no edema.  Lymphadenopathy:    She has no cervical adenopathy.  Neurological: She is alert and oriented to person, place, and time.  Skin: Skin is warm and  dry. No rash noted.  Psychiatric: She has a normal mood and affect.    ED Course  Procedures (including critical care time) Labs Review Labs Reviewed  CBC - Abnormal; Notable for the following:    RBC 2.73 (*)    Hemoglobin 6.6 (*)    HCT 21.4 (*)    MCH 24.2 (*)    RDW 15.9 (*)    All other components within normal limits  COMPREHENSIVE METABOLIC PANEL - Abnormal; Notable for the following:    Chloride 112 (*)    CO2 21 (*)    Glucose, Bld 117 (*)    Calcium 8.0 (*)    Total Protein 5.5 (*)    Albumin 2.9 (*)    All other components within normal limits  PROTIME-INR  POC OCCULT BLOOD, ED  TYPE AND SCREEN  PREPARE RBC (CROSSMATCH)    Imaging Review No results found.   EKG Interpretation None      MDM   Final diagnoses:  Lower GI bleed    Patients Hbg is 6.6 with symptomatic anemia. She was type and cross for 2 units of packed red cells. Her blood pressures been stable. I spoke with the family medicine service who will admit the patient to stepdown.    Malvin Johns, MD 05/12/15 [place], MD 05/12/15 [phone]

## 2015-05-12 NOTE — ED Notes (Signed)
Pt. RFA IV found to be infiltrated. D/C blood transfusion from RFA site and restarted on Rhand. RFA IV removed. Site clean, dry, intact with catheter intact on d/c.

## 2015-05-12 NOTE — H&P (Signed)
[facility] Admission History and Physical Service Pager: [phone]  [name] Medical record number: [id number] Date of birth: 03/03/1956 Age: 59 y.o. Gender: female  Primary Care Provider: [name], MD Consultants: GI  Code Status: FULL per discussion on admission  Chief Complaint: bright red blood per rectum.  Assessment and Plan: [name] is a 59 y.o. female presenting with GI bleed . PMH is significant for previous GI bleed; history of diabetes and hypertension; status post gastric bypass surgery.    GI bleed: BRBPR that has been progressively worsening over the last 12 hours. Likely due to diverticulosis, as it has been in the past. No inciting factors such as NSAIDs or blood thinners noted.  Recent colonoscopy with Dr. Deatra Ina in 11/2014 at which time she was noted to have moderate diverticulosis in the ascending colon,the cecum, in the transverse colon, sigmoid colon, and descending colon. Previous GI bleed June 2016 which did not require hospitalization. Last hospitalization for GI bleed as 12/2014. She's status post gastric bypass done in July 2013. INR stable at 1.18. Hemoglobin down to 6.6 (baseline appears to be ~8) - admit to SDU to monitor closely, attending Dr. McDiarmid - VSS currently.  - just started 2u pRBCs transfusion  - f/u post transfusion H&H - GI consulted, appreciate recs - PPI IV BID - NPO pending GI evaluation - CBC q 6hrs while VVS  Chest pain: Associated with DOE. HEART score 3 (age and RFs).  Most likely from anemia given temporal relationship.  - Will get EKG and troponin  - will get othostatics given dizziness on standing. - Continue to monitor symptoms once Hgb improved  HTN: BPs stable at 130s-120s/80-70s. Has not taken home lisinopril 20mg  today  - re-start lisinopril on 7/12. - Continue to monitor BP   HLD:  - restart Lipitor on 7/12.  H/o DM: Last A1c 5.4 on 03/2015. Not on any  medication.  - will intermittently monitor CBGs  GERD - holding home Nexium while on IV PPI BID  FEN/GI: IV Saline lock, NPO (holding Topamax which patient states is to increase her appetite and she has not been taking x 2 weeks) Prophylaxis: SCDs; No anticoagulation due to acute bleed  Disposition: Monitor in SDU   History of Present Illness: [name] is a 59 y.o. female presenting with bright red blood per rectum.  Per her report, around 9pm yesterday evening she began to notice a light red discoloration to the toilet water. As the evening progressed, the bleeding became more prominent and more frequent: loose stools q1.5hrs with a significant amount of bright red blood in the toilet. Around 5:45am she noted feeling "woozy," nauseated, dizzy when attempting to stand up, some diffuse chest pain and dyspnea on exertion.  She denies any abdominal pain, change in appetite, headache, visual changes, or diaphoresis.  She has a history of lower GI bleeds and states she did not take her daily ASA this AM as she was instructed to do in the past. She denies NSAID use and no blood thinners. The last time she had a bleed was 04/02/15, at which time it was not this severe and she did not require hospitalization.   In the ED, the patient's VSS. Her hemoglobin was noted to be low at 6.6. 2u pRBCs were ordered and started.  Review Of Systems: Per HPI with the following additions: none  Otherwise 12 point review of systems was performed and was unremarkable.  Patient  Active Problem List   Diagnosis Date Noted  . Hypokalemia 03/18/2015  . Lower GI bleeding   . Diverticulosis of colon without hemorrhage 11/18/2014  . Blood in stool 10/07/2014  . Pain in joint, shoulder region 07/25/2014  . Muscle cramps 04/22/2014  . Biliary sludge 04/08/2014  . Bunion 03/28/2014  . Foot pain, bilateral 01/11/2014  . Constipation 10/19/2013  . Subacromial bursitis 05/21/2013  . Eczema 05/21/2013  . External  hemorrhoids without complication 81/44/8185  . Insomnia 08/29/2012  . GERD (gastroesophageal reflux disease) 08/24/2012  . S/P gastric bypass 06/07/2012  . Asthma, cough variant 03/10/2012  . Diverticulosis of colon with hemorrhage   . Hypopigmented skin lesion 02/23/2011  . 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 Allergic rhinitis 05/27/2010  . POSTMENOPAUSAL STATUS 04/07/2010  . LIBIDO, DECREASED 10/16/2009  . H/O type 2 diabetes mellitus 01/25/2008  . CYST, KIDNEY, ACQUIRED 08/15/2007  . CEREBROVASCULAR ACCIDENT, HX OF 07/26/2007  . HTN, goal below 140/90 05/12/2007  . OBESITY, NOS 12/29/2006  . ANEMIA, IRON DEFICIENCY, UNSPEC. 12/29/2006  . Venous (peripheral) insufficiency 12/29/2006   Past Medical History: Past Medical History  Diagnosis Date  . Anemia   . Mini stroke   . GERD (gastroesophageal reflux disease)   . Arthritis     knees  . Hypertension     under control with meds., has been on med. > 20 yr.  . High cholesterol   . Shoulder impingement 08/2013    right  . Rotator cuff rupture, complete 08/2013    right  . Degenerative arthritis of right shoulder region 08/2013  . Wears dentures     upper  . Wears partial dentures     lower  . History of GI bleed   . Back pain 10/04/2012  . Stroke 2006    Remote left lacunar infarct noted on CT head 2006, 2010    Past Surgical History: Past Surgical History  Procedure Laterality Date  . 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 Abdominal hysterectomy      partial  . Total knee arthroplasty Left   . Total knee arthroplasty Right   . Colonoscopy  05/02/2000; 05/08/2013  . Shoulder arthroscopy with rotator cuff repair and subacromial decompression Right 08/09/2013    Procedure: RIGHT SHOULDER ARTHROSCOPY WITH SUBACROMIAL DECOMPRESSION, DISTAL CLAVICLE EXCISION AND ROTATOR CUFF REPAIR and release biceps;  Surgeon: Ninetta Lights, MD;  Location: [place];  Service: Orthopedics;  Laterality: Right;  .  Cosmetic surgery  02/22/2014    skin removal surgery from lower abdomen    Social History: History  Substance Use Topics  . Smoking status: Never Smoker   . Smokeless tobacco: Never Used  . Alcohol Use: Yes     Comment: occasional wine   Additional social history: No smoking or drugs. Occasional glass of wine on the weekend   Please also refer to relevant sections of EMR.  Family History: Family History  Problem Relation Age of Onset  . Cancer Father 47    Died from complications of colon CA  . Diabetes Mother   . Hypertension Mother   . Diabetes Sister   . Heart disease Sister   . Diabetes Sister   . Stroke Sister   . Diabetes Brother   . Stroke Brother   . Diabetes Brother   . Diabetes Brother   . Diabetes Brother    Allergies and Medications: Allergies  Allergen Reactions  . Aspirin Other (See Comments)    GI BLEED  .  Nsaids Other (See Comments)    GI BLEED   No current facility-administered medications on file prior to encounter.   Current Outpatient Prescriptions on File Prior to Encounter  Medication Sig Dispense Refill  . aspirin 81 MG tablet Take 81 mg by mouth daily.    Marland Kitchen atorvastatin (LIPITOR) 40 MG tablet Take 1 tablet (40 mg total) by mouth daily. 90 tablet 3  . calcium carbonate (OS-CAL) 600 MG TABS tablet Take 1 tablet (600 mg total) by mouth 2 (two) times daily with a meal. 180 tablet 1  . Cetirizine HCl (ZYRTEC ALLERGY) 10 MG CAPS Take 1 capsule (10 mg total) by mouth daily. 30 capsule 2  . docusate sodium (COLACE) 100 MG capsule Take 1 capsule (100 mg total) by mouth 2 (two) times daily as needed. For constipation 180 capsule 1  . esomeprazole (NEXIUM) 40 MG capsule Take 1 capsule (40 mg total) by mouth daily. 30 capsule 3  . ferrous sulfate 220 (44 FE) MG/5ML solution TAKE 6.22ml DAILY 30 MINUTES BEFORE BREAKFAST- MIX WITH 1/2 GLASS OF ORANGE JUICE 240 mL 2  . fluticasone (FLONASE) 50 MCG/ACT nasal spray Place 2 sprays into both nostrils at bedtime.  16 g 6  . lisinopril (PRINIVIL,ZESTRIL) 20 MG tablet Take 1 tablet (20 mg total) by mouth daily. 30 tablet 2  . Multiple Vitamin (MULTIVITAMIN) tablet Take 1 tablet by mouth daily. 90 tablet 1  . polyethylene glycol powder (GLYCOLAX/MIRALAX) powder TAKE 17G BY MOUTH TWICE DAILY 850 g 1  . potassium chloride SA (K-DUR,KLOR-CON) 20 MEQ tablet Take two tablets for a one time dose. 2 tablet 0  . topiramate (TOPAMAX) 50 MG tablet Take 25 mg by mouth daily with supper.     . Vitamins A & D 5000-400 UNITS CAPS Take 1 tablet by mouth daily.    Marland Kitchen zolpidem (AMBIEN) 10 MG tablet TAKE ONE TABLET AT BEDTIME AS NEEDED FOR SLEEP 30 tablet 1    Objective: BP 119/77 mmHg  Pulse 79  Temp(Src) 97.9 F (36.6 C)  Resp 16  Ht 5\' 4"  (1.626 m)  Wt 188 lb (85.276 kg)  BMI 32.25 kg/m2  SpO2 100% Exam: General: Lying in bed in NAD. Non-toxic Eyes: Conjunctivae non-injected.  ENTM: Moist mucous membranes. Oropharynx clear. No nasal discharge.  Neck: Supple, no LAD Cardiovascular: RRR. No murmurs, rubs, or gallops noted. 1+ pitting edema noted. Respiratory: No increased WOB. CTAB without wheezing, rhonchi, or crackles noted. Abdomen: +BS, soft, non-distended, non-tender.  MSK: Normal bulk and tone. No gross deformities noted.  Skin: No rashes noted  Neuro: A&O x4. No gross neurologic deficits  Psych:  Appropriate mood and affect.    Labs and Imaging: CBC BMET   Recent Labs Lab 05/12/15 0703  WBC 5.2  HGB 6.6*  HCT 21.4*  PLT 264    Recent Labs Lab 05/12/15 0703  NA 140  K 3.6  CL 112*  CO2 21*  BUN 18  CREATININE 0.61  GLUCOSE 117*  CALCIUM 8.0*     INR 1.18  Archie Patten, MD 05/12/2015, 8:18 AM PGY-2, [place] Intern pager: [phone], text pages welcome

## 2015-05-13 LAB — TYPE AND SCREEN
ABO/RH(D): O POS
Antibody Screen: NEGATIVE
Unit division: 0
Unit division: 0

## 2015-05-13 LAB — HEMOGLOBIN AND HEMATOCRIT, BLOOD
HCT: 23.5 % — ABNORMAL LOW (ref 36.0–46.0)
HCT: 24.2 % — ABNORMAL LOW (ref 36.0–46.0)
HCT: 24.2 % — ABNORMAL LOW (ref 36.0–46.0)
Hemoglobin: 7.5 g/dL — ABNORMAL LOW (ref 12.0–15.0)
Hemoglobin: 7.8 g/dL — ABNORMAL LOW (ref 12.0–15.0)
Hemoglobin: 7.7 g/dL — ABNORMAL LOW (ref 12.0–15.0)

## 2015-05-13 MED ORDER — POLYETHYLENE GLYCOL 3350 17 G PO PACK
17.0000 g | PACK | Freq: Every day | ORAL | Status: DC
  Administered 2015-05-13 – 2015-05-14 (×2): 17 g via ORAL
  Filled 2015-05-13 (×2): qty 1

## 2015-05-13 NOTE — Progress Notes (Signed)
          Daily Rounding Note  05/13/2015, 11:07 AM  LOS: 1 day   SUBJECTIVE:       No stool this AM or yesterday.  Feels well, not dizzy or SOB  OBJECTIVE:         Vital signs in last 24 hours:    Temp:  [97.8 F (36.6 C)-98.9 F (37.2 C)] 98.5 F (36.9 C) (07/12 0834) Pulse Rate:  [64-85] 72 (07/12 0600) Resp:  [13-21] 16 (07/12 0600) BP: (126-163)/(77-140) 155/80 mmHg (07/12 0834) SpO2:  [95 %-100 %] 97 % (07/12 0600) Last BM Date: 05/12/15 Filed Weights   05/12/15 0650  Weight: 188 lb (85.276 kg)   General: pleasant, looks well   Heart: RRR Chest: clear bil.  Abdomen: soft, NT, ND.  BS active  Extremities: no CCE Neuro/Psych:  Oriented x 3, calm, alert.  No obvious deficits.   Intake/Output from previous day: 07/11 0701 - 07/12 0700 In: 1212 [P.O.:600; Blood:612] Out: -   Intake/Output this shift:    Lab Results:  Recent Labs  05/12/15 0703 05/12/15 1851 05/13/15 0021 05/13/15 0800  WBC 5.2 5.6  --   --   HGB 6.6* 7.7* 7.5* 7.8*  HCT 21.4* 23.9* 23.5* 24.2*  PLT 264 225  --   --    BMET  Recent Labs  05/12/15 0703  NA 140  K 3.6  CL 112*  CO2 21*  GLUCOSE 117*  BUN 18  CREATININE 0.61  CALCIUM 8.0*   LFT  Recent Labs  05/12/15 0703  PROT 5.5*  ALBUMIN 2.9*  AST 30  ALT 28  ALKPHOS 68  BILITOT 0.4   PT/INR  Recent Labs  05/12/15 0703  LABPROT 15.1  INR 1.18   Studies/Results: No results found.  ASSESMENT:   * Recurrent diverticular bleed. Resolved for now.    * ABL anemia. S/p PRBCs x 2 on 05/12/2015 with good response.    * S/p Roux-en-Y gastric bypass 2013. Total of 140# weight loss.    PLAN   *  Observe for one more night, advance diet, add back BID Miralax (home regimen).  If no recurrent bleeding, likely can discharge tomorrow.      [name]  05/13/2015, 11:07 AM Pager: [phone]

## 2015-05-13 NOTE — Progress Notes (Signed)
Family Medicine Teaching Service Daily Progress Note Intern Pager: [phone]  [name] Medical record number: [id number] Date of birth: July 06, 1956 Age: 59 y.o. Gender: female  Primary Care Provider: [name], MD Consultants: GI  Code Status: FULL per discussion on admission  Chief Complaint: bright red blood per rectum  Assessment and Plan: [name] is a 59 y.o. female presenting with GI bleed . PMH is significant for previous GI bleed, DM Type II, HTN,  gastric bypass surgery (2013).    Hematochezia: BRBPR that has been progressively worsening over the last 12 hours. Likely due to diverticulosis, as it has been in the past. No inciting factors such as NSAIDs or blood thinners noted. Recent colonoscopy with Dr. Deatra Ina in 11/2014 at which time she was noted to have moderate diverticulosis in the ascending colon,the cecum, in the transverse colon, sigmoid colon, and descending colon. Previous GI bleed June 2016 which did not require hospitalization. Last hospitalization for GI bleed in 12/2014. S/p gastric bypass in 04/2012. INR stable at 1.18. Hemoglobin 6.6 on admission (baseline appears to be ~8). Has received 2U PRBC.  - VSS currently.  - CBC q 6hrs while VVS - Hgb increased from 6.6 to 7.5 after transfusion - Per GI, if rebleed occurs, send patient for nuclear RBC scan. Stop Protonix in the meantime.   - Monitor for repeat bleeds  Chest pain: Associated with DOE. HEART score 3 (age and RFs). Most likely from anemia given temporal relationship.  - EKG (07/11) showed NSR with minimal ST elevations  - Troponins negative x3 - Continue to monitor symptoms once Hgb improved  HTN:  Did not take home lisinopril 20mg  on day of admission. BPs remain around mid 140s/90s - Re-start lisinopril  - Continue to monitor BP   HLD:  - Restart Lipitor   H/o DM: Last A1c 5.4 on 03/2015. Not on any medication.  - Will intermittently monitor CBGs  GERD - Hold home  Nexium - D/c IV protonix   FEN/GI: IV Saline lock, clears (holding Topamax which patient states is to increase her appetite and she has not been taking x 2 weeks) Prophylaxis: SCDs; No anticoagulation due to acute bleed  Disposition: Monitor in SDU   Subjective:  [name] reports that she is feeling much better this morning, and denies any additional bleeding since arrival to the ED yesterday. She denies lightheadedness, dizziness, chest pain, or SOB.   Objective: Temp:  [97.8 F (36.6 C)-98.9 F (37.2 C)] 98.5 F (36.9 C) (07/12 0834) Pulse Rate:  [64-85] 72 (07/12 0600) Resp:  [13-22] 16 (07/12 0600) BP: (126-163)/(77-140) 155/80 mmHg (07/12 0834) SpO2:  [95 %-100 %] 97 % (07/12 0600) Physical Exam: General: well-appearing female lying in bed eating breakfast Cardiovascular: RRR, no wheezes, non-labored, no LE edema  Respiratory: CTAB, no wheezes Abdomen: soft, non-tender, non-distended, +BS Extremities: moving all extremities spontaneously Neuro: A&Ox3, no gross neuro deficits   Laboratory:  Recent Labs Lab 05/12/15 0703 05/12/15 1851 05/13/15 0021 05/13/15 0800  WBC 5.2 5.6  --   --   HGB 6.6* 7.7* 7.5* 7.8*  HCT 21.4* 23.9* 23.5* 24.2*  PLT 264 225  --   --     Recent Labs Lab 05/12/15 0703  NA 140  K 3.6  CL 112*  CO2 21*  BUN 18  CREATININE 0.61  CALCIUM 8.0*  PROT 5.5*  BILITOT 0.4  ALKPHOS 68  ALT 28  AST 30  GLUCOSE 117*  Imaging/Diagnostic Tests: No results found.   Verner Mould, MD 05/13/2015, 9:14 AM PGY-1, [place] Intern pager: [phone], text pages welcome

## 2015-05-14 DIAGNOSIS — R0789 Other chest pain: Secondary | ICD-10-CM

## 2015-05-14 LAB — HEMOGLOBIN AND HEMATOCRIT, BLOOD
HCT: 22.6 % — ABNORMAL LOW (ref 36.0–46.0)
Hemoglobin: 7.2 g/dL — ABNORMAL LOW (ref 12.0–15.0)

## 2015-05-14 NOTE — Progress Notes (Signed)
Went over all d/c instructions with pt. Given GI bleed exit care notes & discussed when to call the DR.

## 2015-05-14 NOTE — Discharge Summary (Signed)
[facility] Discharge Summary  [name] Medical record number: [id number] Date of birth: Dec 19, 1955 Age: 60 y.o. Gender: female Date of Admission: 05/12/2015  Date of Discharge: 0713/16 Admitting Physician: Blane Ohara McDiarmid, MD  Primary Care Provider: [name], MD Consultants: GI  Indication for Hospitalization: BRBPR  Discharge Diagnoses/Problem List:  Patient Active Problem List   Diagnosis Date Noted  . GI bleed 05/12/2015  . Chest pain 05/12/2015  . Acute blood loss anemia   . Lower GI bleed   . Hypokalemia 03/18/2015  . Lower GI bleeding   . Diverticulosis of colon without hemorrhage 11/18/2014  . Blood in stool 10/07/2014  . Pain in joint, shoulder region 07/25/2014  . Muscle cramps 04/22/2014  . Biliary sludge 04/08/2014  . Bunion 03/28/2014  . Foot pain, bilateral 01/11/2014  . Constipation 10/19/2013  . Subacromial bursitis 05/21/2013  . Eczema 05/21/2013  . External hemorrhoids without complication 00/86/7619  . Insomnia 08/29/2012  . GERD (gastroesophageal reflux disease) 08/24/2012  . S/P gastric bypass 06/07/2012  . Asthma, cough variant 03/10/2012  . Diverticulosis of colon with hemorrhage   . Hypopigmented skin lesion 02/23/2011  . 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 Allergic rhinitis 05/27/2010  . POSTMENOPAUSAL STATUS 04/07/2010  . LIBIDO, DECREASED 10/16/2009  . H/O type 2 diabetes mellitus 01/25/2008  . CYST, KIDNEY, ACQUIRED 08/15/2007  . CEREBROVASCULAR ACCIDENT, HX OF 07/26/2007  . HTN, goal below 140/90 05/12/2007  . OBESITY, NOS 12/29/2006  . ANEMIA, IRON DEFICIENCY, UNSPEC. 12/29/2006  . Venous (peripheral) insufficiency 12/29/2006     Disposition: Home  Discharge Condition: Stable  Discharge Exam:  General: well-appearing female lying sitting on edge of the bed w/o AD Cardiovascular: RRR, no wheezes, non-labored, no LE edema  Respiratory: CTAB, no wheezes Abdomen: soft,  non-tender, non-distended, +BS Extremities: moving all extremities spontaneously Neuro: A&Ox3, no gross neuro deficits   Brief Hospital Course:  Ms. [name] presented after noticing bright red blood in the toilet water the night before admission. She first noticed blood at 9 PM, which became more prominent throughout the night. She also began to have frequent stools, approximately every 1.5 hours, with a significant amount of bright red blood in the toilet water. She began to feel "woozy," nauseated, dizzy when standing up, chest pain, and DOE. At this time she came to the ED. She reported that she did not take her daily ASA the morning of her bleed. Of note, she has a history of GI bleeds due to diverticulosis. She recently had a GI bleed one month ago, though she was not hospitalized for this episode. Her last colonoscopy was in 11/2014 and showed moderate diverticulosis in ascending colon, cecum, transverse colon, sigmoid colon and descending colon.  Upon arrival her Hgb was 6.6 She received 2U PRBC. Her vital signs were stable. Her Hgb increased to 7.5 after transfusion. GI was consulted, and advised no interventions at the time. If the patient began to bleed again, they recommended nuclear RBC scan.  After her admission, the patient reported no additional bleeding. Her Hgb continued to increase to 7.7 on day of discharge. After transfusion, she denied further lightheadedness, chest pain, DOE, or dizziness. After monitoring for rebleed for 48 hours she was dismissed.   Issues for Follow Up:  1. Per GI recommendation, repeat Hgb at hospital follow-up appointment and monitor regularly at subsequent appointments.   Significant Procedures: None  Significant Labs and Imaging:  Hgb (07/11) - 6.6 INR (07/11) - 1.8  Troponins negative x3 (07/11) EKG (07/11) - NSR with minimal ST elevations  Recent Labs Lab 05/12/15 0703 05/12/15 1851  05/13/15 0800 05/13/15 1658 05/14/15 0010  WBC 5.2 5.6  --    --   --   --   HGB 6.6* 7.7*  < > 7.8* 7.7* 7.2*  HCT 21.4* 23.9*  < > 24.2* 24.2* 22.6*  PLT 264 225  --   --   --   --   < > = values in this interval not displayed.  Recent Labs Lab 05/12/15 0703  NA 140  K 3.6  CL 112*  CO2 21*  GLUCOSE 117*  BUN 18  CREATININE 0.61  CALCIUM 8.0*  ALKPHOS 68  AST 30  ALT 28  ALBUMIN 2.9*    Results/Tests Pending at Time of Discharge: None  Discharge Medications:    Medication List    STOP taking these medications        aspirin 81 MG tablet      TAKE these medications        atorvastatin 40 MG tablet  Commonly known as:  LIPITOR  Take 1 tablet (40 mg total) by mouth daily.     calcium carbonate 600 MG Tabs tablet  Commonly known as:  OS-CAL  Take 1 tablet (600 mg total) by mouth 2 (two) times daily with a meal.     Cetirizine HCl 10 MG Caps  Commonly known as:  ZYRTEC ALLERGY  Take 1 capsule (10 mg total) by mouth daily.     docusate sodium 100 MG capsule  Commonly known as:  COLACE  Take 1 capsule (100 mg total) by mouth 2 (two) times daily as needed. For constipation     esomeprazole 40 MG capsule  Commonly known as:  NEXIUM  Take 1 capsule (40 mg total) by mouth daily.     ferrous sulfate 220 (44 FE) MG/5ML solution  Take 268.4 mg by mouth 2 (two) times daily with a meal. 6.43ml twice daily     fluticasone 50 MCG/ACT nasal spray  Commonly known as:  FLONASE  Place 2 sprays into both nostrils at bedtime.     lisinopril 20 MG tablet  Commonly known as:  PRINIVIL,ZESTRIL  Take 1 tablet (20 mg total) by mouth daily.     multivitamin tablet  Take 1 tablet by mouth daily.     polyethylene glycol powder powder  Commonly known as:  GLYCOLAX/MIRALAX  TAKE 17G BY MOUTH TWICE DAILY     potassium chloride SA 20 MEQ tablet  Commonly known as:  K-DUR,KLOR-CON  Take two tablets for a one time dose.     topiramate 50 MG tablet  Commonly known as:  TOPAMAX  Take 50 mg by mouth daily with supper.     Vitamins A &  D 5000-400 UNITS Caps  Take 1 tablet by mouth daily.     zolpidem 10 MG tablet  Commonly known as:  AMBIEN  TAKE ONE TABLET AT BEDTIME AS NEEDED FOR SLEEP        Discharge Instructions: Please refer to Patient Instructions section of EMR for full details.  Patient was counseled important signs and symptoms that should prompt return to medical care, changes in medications, dietary instructions, activity restrictions, and follow up appointments.   Follow-Up Appointments: Follow-up Information    Follow up with [name], MD On 05/26/2015.   Specialty:  Family Medicine   Why:  8:45am   Contact information:   [address]  [place] [address] [place], MD 05/14/2015, 9:22 PM PGY-1, [place]

## 2015-05-14 NOTE — Progress Notes (Signed)
Pt discharged per w/c by Nurse tech with all belongings including glasses, Cell phone and charger. Has all d/c instructions and exit care notes.

## 2015-05-14 NOTE — Care Management Important Message (Signed)
Important Message  Patient Details  Name: [name] MRN: [id number] Date of Birth: 1956-10-17   Medicare Important Message Given:  Yes-second notification given    Nathen May 05/14/2015, 2:14 [place] Message  Patient Details  Name: [name] MRN: [id number] Date of Birth: 07-26-1956   Medicare Important Message Given:  Yes-second notification given    Nathen May 05/14/2015, 2:14 PM

## 2015-05-14 NOTE — Progress Notes (Signed)
Family Medicine Teaching Service Daily Progress Note Intern Pager: [phone]  [name] Medical record number: [id number] Date of birth: 11/10/1955 Age: 59 y.o. Gender: female  Primary Care Provider: [name], MD Consultants: GI  Code Status: FULL per discussion on admission  Chief Complaint: bright red blood per rectum  Assessment and Plan: [name] is a 59 y.o. female presenting with GI bleed . PMH is significant for previous GI bleed, DM Type II, HTN,  gastric bypass surgery (2013).    Hematochezia: BRBPR that has been progressively worsening over the last 12 hours. Likely due to diverticulosis, as it has been in the past. No inciting factors such as NSAIDs or blood thinners noted. Recent colonoscopy with Dr. Deatra Ina in 11/2014 at which time she was noted to have moderate diverticulosis in the ascending colon,the cecum, in the transverse colon, sigmoid colon, and descending colon. Previous GI bleed June 2016 which did not require hospitalization. Last hospitalization for GI bleed in 12/2014. S/p gastric bypass in 04/2012. INR stable at 1.18. Hemoglobin 6.6 >2upRBC>7.7. Hgb 7.2 prior today. Patient asymptomatic. VSS stable. Patient is okay to go home per GI.   Chest pain: Associated with DOE. HEART score 3 (age and RFs). Most likely from anemia given temporal relationship. EKG (07/11) showed NSR with minimal ST elevations.Troponins negative x3. Holding her ASA due to bleeding. -Defer the decision to resume this to PCP at follow up.  HTN: BPs remain around mid 140s/90s on home lisinopril  - Can go home on home meds.  HLD:  - Restart Lipitor   H/o DM: Last A1c 5.4 on 03/2015. Not on any medication.  - Will intermittently monitor CBGs  GERD - Hold home Nexium - D/c IV protonix   FEN/GI: IV Saline lock, clears (holding Topamax which patient states is to increase her appetite and she has not been taking x 2 weeks) Prophylaxis: SCDs; No anticoagulation due  to acute bleed  Disposition: Monitor in SDU   Subjective:  NAEON. Denies bleeding, SOB, dizziness and chest pain.   Objective: Temp:  [97.6 F (36.4 C)-99 F (37.2 C)] 99 F (37.2 C) (07/13 1216) Pulse Rate:  [73-87] 80 (07/13 0837) Resp:  [12-25] 25 (07/13 1216) BP: (120-145)/(62-92) 137/79 mmHg (07/13 1216) SpO2:  [98 %-100 %] 100 % (07/13 1216) Physical Exam: General: well-appearing female lying sitting on edge of the bed w/o AD Cardiovascular: RRR, no wheezes, non-labored, no LE edema  Respiratory: CTAB, no wheezes Abdomen: soft, non-tender, non-distended, +BS Extremities: moving all extremities spontaneously Neuro: A&Ox3, no gross neuro deficits   Laboratory:  Recent Labs Lab 05/12/15 0703 05/12/15 1851  05/13/15 0800 05/13/15 1658 05/14/15 0010  WBC 5.2 5.6  --   --   --   --   HGB 6.6* 7.7*  < > 7.8* 7.7* 7.2*  HCT 21.4* 23.9*  < > 24.2* 24.2* 22.6*  PLT 264 225  --   --   --   --   < > = values in this interval not displayed.  Recent Labs Lab 05/12/15 0703  NA 140  K 3.6  CL 112*  CO2 21*  BUN 18  CREATININE 0.61  CALCIUM 8.0*  PROT 5.5*  BILITOT 0.4  ALKPHOS 68  ALT 28  AST 30  GLUCOSE 117*    Imaging/Diagnostic Tests: No results found.   Mercy Riding, MD 05/14/2015, 1:44 PM PGY-1, [facility] Intern pager: [phone], text pages welcome

## 2015-05-14 NOTE — Progress Notes (Signed)
     [facility] Progress Note  Subjective:  No further sign of bleeding.  Tolerating regular diet.    Objective:  Vital signs in last 24 hours: Temp:  [97.6 F (36.4 C)-98 F (36.7 C)] 97.8 F (36.6 C) (07/13 0837) Pulse Rate:  [73-87] 80 (07/13 0837) Resp:  [12-22] 13 (07/13 0837) BP: (120-145)/(62-107) 145/87 mmHg (07/13 0837) SpO2:  [98 %-100 %] 100 % (07/13 0837) Last BM Date: 05/12/15 General:  Alert, Well-developed, in NAD Heart:  Regular rate and rhythm; no murmurs Pulm:  CTAB.  No W/R/R. Abdomen:  Soft, non-distended. Normal bowel sounds.  Non-tender. Extremities:  Without edema. Neurologic:  Alert and  oriented x4;  grossly normal neurologically. Psych:  Alert and cooperative. Normal mood and affect.  Intake/Output from previous day: 07/12 0701 - 07/13 0700 In: 240 [P.O.:240] Out: -   Lab Results:  Recent Labs  05/12/15 0703 05/12/15 1851  05/13/15 0800 05/13/15 1658 05/14/15 0010  WBC 5.2 5.6  --   --   --   --   HGB 6.6* 7.7*  < > 7.8* 7.7* 7.2*  HCT 21.4* 23.9*  < > 24.2* 24.2* 22.6*  PLT 264 225  --   --   --   --   < > = values in this interval not displayed. BMET  Recent Labs  05/12/15 0703  NA 140  K 3.6  CL 112*  CO2 21*  GLUCOSE 117*  BUN 18  CREATININE 0.61  CALCIUM 8.0*   LFT  Recent Labs  05/12/15 0703  PROT 5.5*  ALBUMIN 2.9*  AST 30  ALT 28  ALKPHOS 68  BILITOT 0.4   PT/INR  Recent Labs  05/12/15 0703  LABPROT 15.1  INR 1.18   Assessment / Plan: *Recurrent diverticular bleed:  Resolved for now.   *ABL anemia:  S/p PRBCs x 2 on 05/12/2015 with good response.  Hgb down slightly this AM but no further sign of bleeding so may be due to dilution/equilibration.   *S/p Roux-en-Y gastric bypass 2013:  Total of 140# weight loss.  -Ok for discharge today from GI standpoint.  Hgb to be monitored regularly by her PCP.     LOS: 2 days   [name] D.  05/14/2015, 9:00 AM  Pager number  [phone]

## 2015-05-14 NOTE — Discharge Instructions (Signed)
You were admitted to the hospital with rectal bleeding. This was most likely due to your diverticulitis. While here, we monitored your blood counts which were stable. Please let your doctor know if you have any further episodes of bleeding. It is important that you follow up with your primary doctor.   We also stopped your aspirin while here, you should not take this medication after leaving the hospital.   Gastrointestinal Bleeding Gastrointestinal (GI) bleeding means there is bleeding somewhere along the digestive tract, between the mouth and anus. CAUSES  There are many different problems that can cause GI bleeding. Possible causes include:  Esophagitis. This is inflammation, irritation, or swelling of the esophagus.  Hemorrhoids.These are veins that are full of blood (engorged) in the rectum. They cause pain, inflammation, and may bleed.  Anal fissures.These are areas of painful tearing which may bleed. They are often caused by passing hard stool.  Diverticulosis.These are pouches that form on the colon over time, with age, and may bleed significantly.  Diverticulitis.This is inflammation in areas with diverticulosis. It can cause pain, fever, and bloody stools, although bleeding is rare.  Polyps and cancer. Colon cancer often starts out as precancerous polyps.  Gastritis and ulcers.Bleeding from the upper gastrointestinal tract (near the stomach) may travel through the intestines and produce black, sometimes tarry, often bad smelling stools. In certain cases, if the bleeding is fast enough, the stools may not be black, but red. This condition may be life-threatening. SYMPTOMS   Vomiting bright red blood or material that looks like coffee grounds.  Bloody, black, or tarry stools. DIAGNOSIS  Your caregiver may diagnose your condition by taking your history and performing a physical exam. More tests may be needed, including:  X-rays and other imaging  tests.  Esophagogastroduodenoscopy (EGD). This test uses a flexible, lighted tube to look at your esophagus, stomach, and small intestine.  Colonoscopy. This test uses a flexible, lighted tube to look at your colon. TREATMENT  Treatment depends on the cause of your bleeding.   For bleeding from the esophagus, stomach, small intestine, or colon, the caregiver doing your EGD or colonoscopy may be able to stop the bleeding as part of the procedure.  Inflammation or infection of the colon can be treated with medicines.  Many rectal problems can be treated with creams, suppositories, or warm baths.  Surgery is sometimes needed.  Blood transfusions are sometimes needed if you have lost a lot of blood. If bleeding is slow, you may be allowed to go home. If there is a lot of bleeding, you will need to stay in the hospital for observation. HOME CARE INSTRUCTIONS   Take any medicines exactly as prescribed.  Keep your stools soft by eating foods that are high in fiber. These foods include whole grains, legumes, fruits, and vegetables. Prunes (1 to 3 a day) work well for many people.  Drink enough fluids to keep your urine clear or pale yellow. SEEK IMMEDIATE MEDICAL CARE IF:   Your bleeding increases.  You feel lightheaded, weak, or you faint.  You have severe cramps in your back or abdomen.  You pass large blood clots in your stool.  Your problems are getting worse. MAKE SURE YOU:   Understand these instructions.  Will watch your condition.  Will get help right away if you are not doing well or get worse. Document Released: 10/15/2000 Document Revised: 10/04/2012 Document Reviewed: 09/27/2011 [facility] Patient Information 2015 [place], [place]. This information is not intended to replace advice given  to you by your health care provider. Make sure you discuss any questions you have with your health care provider.

## 2015-05-17 ENCOUNTER — Encounter (HOSPITAL_COMMUNITY): Payer: Self-pay | Admitting: Physical Medicine and Rehabilitation

## 2015-05-17 ENCOUNTER — Emergency Department (HOSPITAL_COMMUNITY)
Admission: EM | Admit: 2015-05-17 | Discharge: 2015-05-17 | Disposition: A | Discharge status: Home / Self Care | Payer: Commercial Managed Care - HMO | Attending: Emergency Medicine | Admitting: Emergency Medicine

## 2015-05-17 DIAGNOSIS — Z8673 Personal history of transient ischemic attack (TIA), and cerebral infarction without residual deficits: Secondary | ICD-10-CM | POA: Insufficient documentation

## 2015-05-17 DIAGNOSIS — Z7951 Long term (current) use of inhaled steroids: Secondary | ICD-10-CM | POA: Diagnosis not present

## 2015-05-17 DIAGNOSIS — E782 Mixed hyperlipidemia: Secondary | ICD-10-CM | POA: Insufficient documentation

## 2015-05-17 DIAGNOSIS — I10 Essential (primary) hypertension: Secondary | ICD-10-CM | POA: Insufficient documentation

## 2015-05-17 DIAGNOSIS — M1990 Unspecified osteoarthritis, unspecified site: Secondary | ICD-10-CM | POA: Diagnosis not present

## 2015-05-17 DIAGNOSIS — R252 Cramp and spasm: Secondary | ICD-10-CM | POA: Diagnosis not present

## 2015-05-17 DIAGNOSIS — D649 Anemia, unspecified: Secondary | ICD-10-CM | POA: Diagnosis not present

## 2015-05-17 DIAGNOSIS — K219 Gastro-esophageal reflux disease without esophagitis: Secondary | ICD-10-CM | POA: Insufficient documentation

## 2015-05-17 DIAGNOSIS — M79642 Pain in left hand: Secondary | ICD-10-CM | POA: Diagnosis not present

## 2015-05-17 DIAGNOSIS — Z79899 Other long term (current) drug therapy: Secondary | ICD-10-CM | POA: Insufficient documentation

## 2015-05-17 DIAGNOSIS — M79641 Pain in right hand: Secondary | ICD-10-CM | POA: Diagnosis not present

## 2015-05-17 LAB — I-STAT CHEM 8, ED
BUN: 15 mg/dL (ref 6–20)
Calcium, Ion: 1.2 mmol/L (ref 1.12–1.23)
Chloride: 109 mmol/L (ref 101–111)
Creatinine, Ser: 0.7 mg/dL (ref 0.44–1.00)
Glucose, Bld: 89 mg/dL (ref 65–99)
HCT: 26 % — ABNORMAL LOW (ref 36.0–46.0)
Hemoglobin: 8.8 g/dL — ABNORMAL LOW (ref 12.0–15.0)
Potassium: 3.7 mmol/L (ref 3.5–5.1)
Sodium: 145 mmol/L (ref 135–145)
TCO2: 20 mmol/L (ref 0–100)

## 2015-05-17 NOTE — ED Notes (Signed)
Pt reports bilateral hand cramping this morning. Denies recent injury. No swelling, no deformity noted. CMS intact to both hands.

## 2015-05-17 NOTE — ED Notes (Addendum)
Pt reports hand cramping and pain this morning. States her hand were "disfigured." States previous issues with same in the past. Able to wiggle digits upon arrival. Capillary refill less than 2 seconds. No neurological deficits noted. Pt is alert and oriented x4.

## 2015-05-17 NOTE — Discharge Instructions (Signed)

## 2015-05-18 NOTE — ED Provider Notes (Signed)
CSN: [id number]     Arrival date & time 05/17/15  7169 History   First MD Initiated Contact with Patient 05/17/15 [phone]     Chief Complaint  Patient presents with  . Hand Pain     (Consider location/radiation/quality/duration/timing/severity/associated sxs/prior Treatment) HPI Comments: Patient with a history of HTN, GI bleed (recent admission), GERD presents with cramping in bilateral hands causing deformity of fingers. The spasm lasted about 2 hours and has completely resolved. She reports having lower extremity cramps but never that affected her hands.   Patient is a 59 y.o. female presenting with hand pain. The history is provided by the patient. No language interpreter was used.  Hand Pain This is a new problem. Pertinent negatives include no chills, fever, nausea or numbness.    Past Medical History  Diagnosis Date  . Anemia   . Mini stroke   . GERD (gastroesophageal reflux disease)   . Arthritis     knees  . Hypertension     under control with meds., has been on med. > 20 yr.  . High cholesterol   . Shoulder impingement 08/2013    right  . Rotator cuff rupture, complete 08/2013    right  . Degenerative arthritis of right shoulder region 08/2013  . Wears dentures     upper  . Wears partial dentures     lower  . History of GI bleed   . Back pain 10/04/2012  . Stroke 2006    Remote left lacunar infarct noted on CT head 2006, 2010    Past Surgical History  Procedure Laterality Date  . 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 Abdominal hysterectomy      partial  . Total knee arthroplasty Left   . Total knee arthroplasty Right   . Colonoscopy  05/02/2000; 05/08/2013  . Shoulder arthroscopy with rotator cuff repair and subacromial decompression Right 08/09/2013    Procedure: RIGHT SHOULDER ARTHROSCOPY WITH SUBACROMIAL DECOMPRESSION, DISTAL CLAVICLE EXCISION AND ROTATOR CUFF REPAIR and release biceps;  Surgeon: Ninetta Lights, MD;  Location: [facility];   Service: Orthopedics;  Laterality: Right;  . Cosmetic surgery  02/22/2014    skin removal surgery from lower abdomen    Family History  Problem Relation Age of Onset  . Cancer Father 33    Died from complications of colon CA  . Diabetes Mother   . Hypertension Mother   . Diabetes Sister   . Heart disease Sister   . Diabetes Sister   . Stroke Sister   . Diabetes Brother   . Stroke Brother   . Diabetes Brother   . Diabetes Brother   . Diabetes Brother    History  Substance Use Topics  . Smoking status: Never Smoker   . Smokeless tobacco: Never Used  . Alcohol Use: Yes     Comment: occasional wine   OB History    No data available     Review of Systems  Constitutional: Negative for fever and chills.  Gastrointestinal: Negative.  Negative for nausea.  Musculoskeletal:       See HPI.  Skin: Negative.  Negative for color change.  Neurological: Negative.  Negative for numbness.      Allergies  Aspirin and Nsaids  Home Medications   Prior to Admission medications   Medication Sig Start Date End Date Taking? Authorizing Provider  atorvastatin (LIPITOR) 40 MG tablet Take 1 tablet (40 mg total) by mouth daily. 08/19/14   Bernadene Bell, MD  calcium carbonate (OS-CAL) 600 MG TABS tablet Take 1 tablet (600 mg total) by mouth 2 (two) times daily with a meal. 01/07/14   Boykin Nearing, MD  Cetirizine HCl (ZYRTEC ALLERGY) 10 MG CAPS Take 1 capsule (10 mg total) by mouth daily. 03/17/15   [name], MD  docusate sodium (COLACE) 100 MG capsule Take 1 capsule (100 mg total) by mouth 2 (two) times daily as needed. For constipation 08/19/14   Bernadene Bell, MD  esomeprazole (NEXIUM) 40 MG capsule Take 1 capsule (40 mg total) by mouth daily. 03/17/15   [name], MD  ferrous sulfate 220 (44 FE) MG/5ML solution Take 268.4 mg by mouth 2 (two) times daily with a meal. 6.92ml twice daily    Historical Provider, MD  fluticasone (FLONASE) 50 MCG/ACT nasal spray Place 2 sprays into  both nostrils at bedtime. 01/07/14   Josalyn Funches, MD  lisinopril (PRINIVIL,ZESTRIL) 20 MG tablet Take 1 tablet (20 mg total) by mouth daily. 03/17/15   [name], MD  Multiple Vitamin (MULTIVITAMIN) tablet Take 1 tablet by mouth daily. 01/07/14   Josalyn Funches, MD  polyethylene glycol powder (GLYCOLAX/MIRALAX) powder TAKE 17G BY MOUTH TWICE DAILY 11/07/14   [name], MD  potassium chloride SA (K-DUR,KLOR-CON) 20 MEQ tablet Take two tablets for a one time dose. 03/18/15   [name], MD  topiramate (TOPAMAX) 50 MG tablet Take 50 mg by mouth daily with supper.  12/26/14 12/26/15  Historical Provider, MD  Vitamins A & D 5000-400 UNITS CAPS Take 1 tablet by mouth daily. 02/18/15   Historical Provider, MD  zolpidem (AMBIEN) 10 MG tablet TAKE ONE TABLET AT BEDTIME AS NEEDED FOR SLEEP 12/26/14   [name], MD   BP 124/72 mmHg  Pulse 86  Temp(Src) 98.2 F (36.8 C) (Oral)  Resp 18  SpO2 100% Physical Exam  Constitutional: She is oriented to person, place, and time. She appears well-developed and well-nourished.  Neck: Normal range of motion.  Pulmonary/Chest: Effort normal.  Musculoskeletal:  Hands bilaterally are non-tender, FROM all joints, full strength. No discoloration. Hands are equal in temperature to light touch.   Neurological: She is alert and oriented to person, place, and time.  Skin: Skin is warm and dry.    ED Course  Procedures (including critical care time) Labs Review Labs Reviewed  I-STAT CHEM 8, ED - Abnormal; Notable for the following:    Hemoglobin 8.8 (*)    HCT 26.0 (*)    All other components within normal limits    Imaging Review No results found.   EKG Interpretation None      MDM   Final diagnoses:  Muscle cramping    The patient has remained asymptomatic in the emergency department. Lab studies unremarkable. She can be discharged home to follow up with her PCP for recheck next week.     Charlann Lange, PA-C 05/18/15 [place], MD 05/19/15 [phone]

## 2015-05-19 ENCOUNTER — Ambulatory Visit: Payer: Commercial Managed Care - HMO

## 2015-05-20 ENCOUNTER — Ambulatory Visit (INDEPENDENT_AMBULATORY_CARE_PROVIDER_SITE_OTHER): Payer: Commercial Managed Care - HMO | Admitting: Podiatry

## 2015-05-20 ENCOUNTER — Encounter: Payer: Self-pay | Admitting: Podiatry

## 2015-05-20 VITALS — BP 141/84 | HR 88 | Resp 15

## 2015-05-20 DIAGNOSIS — M79673 Pain in unspecified foot: Secondary | ICD-10-CM

## 2015-05-20 DIAGNOSIS — B351 Tinea unguium: Secondary | ICD-10-CM

## 2015-05-20 NOTE — Progress Notes (Signed)
Patient ID: [name], female   DOB: Jul 03, 1956, 59 y.o.   MRN: [id number]  Complaint:  Visit Type: Patient returns to my office for continued preventative foot care services. Complaint: Patient states" my nails have grown long and thick and become painful to walk and wear shoes" Patient has been diagnosed with DM with no foot  complications.She presents for preventative foot care services. No changes to ROS  Podiatric Exam: Vascular: dorsalis pedis and posterior tibial pulses are palpable bilateral. Capillary return is immediate. Temperature gradient is WNL. Skin turgor WNL  Sensorium: Normal Semmes Weinstein monofilament test. Normal tactile sensation bilaterally. Nail Exam: Pt has thick disfigured discolored nails with subungual debris noted bilateral entire nail hallux through fifth toenails Ulcer Exam: There is no evidence of ulcer or pre-ulcerative changes or infection. Orthopedic Exam: Muscle tone and strength are WNL. No limitations in general ROM. No crepitus or effusions noted. Foot type and digits show no abnormalities. Bony prominences are unremarkable. Skin: No Porokeratosis. No infection or ulcers  Diagnosis:  Tinea unguium, Pain in right toe, pain in left toes  Treatment & Plan Procedures and Treatment: Consent by patient was obtained for treatment procedures. The patient understood the discussion of treatment and procedures well. All questions were answered thoroughly reviewed. Debridement of mycotic and hypertrophic toenails, 1 through 5 bilateral and clearing of subungual debris. No ulceration, no infection noted.  Return Visit-Office Procedure: Patient instructed to return to the office for a follow up visit 3 months for continued evaluation and treatment.

## 2015-05-26 ENCOUNTER — Other Ambulatory Visit: Payer: Self-pay | Admitting: Family Medicine

## 2015-05-26 ENCOUNTER — Ambulatory Visit (INDEPENDENT_AMBULATORY_CARE_PROVIDER_SITE_OTHER): Payer: Commercial Managed Care - HMO | Admitting: Family Medicine

## 2015-05-26 ENCOUNTER — Encounter: Payer: Self-pay | Admitting: Family Medicine

## 2015-05-26 VITALS — BP 150/89 | HR 87 | Temp 98.3°F | Ht 64.0 in | Wt 193.0 lb

## 2015-05-26 DIAGNOSIS — K5731 Diverticulosis of large intestine without perforation or abscess with bleeding: Secondary | ICD-10-CM

## 2015-05-26 DIAGNOSIS — K219 Gastro-esophageal reflux disease without esophagitis: Secondary | ICD-10-CM

## 2015-05-26 DIAGNOSIS — G44209 Tension-type headache, unspecified, not intractable: Secondary | ICD-10-CM | POA: Diagnosis not present

## 2015-05-26 DIAGNOSIS — M791 Myalgia, unspecified site: Secondary | ICD-10-CM

## 2015-05-26 DIAGNOSIS — D649 Anemia, unspecified: Secondary | ICD-10-CM | POA: Diagnosis not present

## 2015-05-26 DIAGNOSIS — M7918 Myalgia, other site: Secondary | ICD-10-CM | POA: Insufficient documentation

## 2015-05-26 HISTORY — DX: Myalgia, unspecified site: M79.10

## 2015-05-26 LAB — POCT HEMOGLOBIN: Hemoglobin: 7.4 g/dL — AB (ref 12.2–16.2)

## 2015-05-26 MED ORDER — ESOMEPRAZOLE MAGNESIUM 40 MG PO CPDR: 40.0000 mg | DELAYED_RELEASE_CAPSULE | Freq: Every day | ORAL | Status: DC

## 2015-05-26 MED ORDER — ZOLPIDEM TARTRATE 10 MG PO TABS: ORAL_TABLET | ORAL | Status: DC

## 2015-05-26 MED ORDER — SUCRALFATE 1 GM/10ML PO SUSP: 1.0000 g | Freq: Three times a day (TID) | ORAL | Status: DC

## 2015-05-26 NOTE — Assessment & Plan Note (Signed)
Recent hospitalization for diverticular bleed with anemia. Hemoglobin slightly decreased from discharge (7.5 down to 6.6/7.4 on repeat). No current bleeding.  -continue daily iron sulfate TID -patient to return to GI for further workup.

## 2015-05-26 NOTE — Patient Instructions (Addendum)
Low hemoglobin - continue the iron three times a day, please make an appointment with the GI doctor, if you can not get in with them in the next week please come back to my office to recheck the hemoglobin (may be a lab appointment). Call me to let me know if you can get it.   Headache - likely tension headache, continue tylenol as needed, keep a headache dairy, return if symptoms worsen  Back pain - likely muscle spasm, continue heat to the area  GERD/acid reflux - please see your GI doctor, they may need to do an upper GI scope, continue nexium

## 2015-05-26 NOTE — Progress Notes (Signed)
   Subjective:    Patient ID: [name], female    DOB: Oct 02, 1956, 59 y.o.   MRN: [id number]  HPI 59 y/o female presents for hospital follow up. Admitted to [facility] from 05/12/15 - 05/14/15 for rectal bleeding and severe anemia. The source of the bleeding was thought to be diverticular in nature. I have reviewed the discharge summary. Hemoglobing initially 6.6. Received two units PRBC's, no bleeding during hospitalization. Hemoglobin at time of discharge was 7.5.  Patient reports no further rectal/GI bleeding however she does report fatigue. She has been taking Ferrous Sulfate 325 mg TID.   GERD - taking Nexium daily, still reports daily acid reflux symptoms  Insomnia - patient continues to take Ambien almost daily, reports sleeping well with this medication  Back pain - right scapular back pain, has had intermittently for 4 weeks, started after beginning swim classes, no associated sob/pleuisy. Has been applying heating pad.   Headache - frontal, no light or sound sensitivity, some blurry vision but due for new reading glasses, no exacerbating factors, relived after 15 minutes by tylenol, occur every other day, no nighttime awakenings.   Social - never a smoker  Review of Systems See above.     Objective:   Physical Exam Vitals: reviewed Gen: pleasant female, NAD HEENT: normocephalic, PERRL, pale conjunctiva, MMM, neck supple, no adenopathy Cardiac: RRR, S1 and S2 present, no murmur Resp: CTAB, normal effort Abd: soft, mild epigastric tenderness, normal bowel sounds Ext: trace leg edema bilaterally MSK: right paraspinal thoracic tenderness, no skin lesions, no midline tenderness, strength 5/5 in upper extremities  POC Hemoglobin - 6.6 and 7.4 on repeat.  Colonoscopy performed in 11/2014 showed moderate diffuse diverticulosis of the Colon.     Assessment & Plan:  Please see problem specific assessment and plan.

## 2015-05-26 NOTE — Assessment & Plan Note (Signed)
Tension type headache. No concerning red flag or neurologic symptoms -encouraged tylenol prn -keep headache diary -return precautions given

## 2015-05-26 NOTE — Assessment & Plan Note (Signed)
Right paraspinal thoracic pain. Likely muscle spasm. -conservative management with heat and tylenol

## 2015-05-26 NOTE — Assessment & Plan Note (Signed)
Reflux uncontrolled with Nexium -start carafate with meals -patient to return to GI for possible upper GI scope

## 2015-05-27 ENCOUNTER — Telehealth: Payer: Self-pay | Admitting: Gastroenterology

## 2015-05-28 NOTE — Telephone Encounter (Signed)
Patient has seen her PCP. Hospital follow up appointment made.

## 2015-05-29 ENCOUNTER — Ambulatory Visit (INDEPENDENT_AMBULATORY_CARE_PROVIDER_SITE_OTHER): Payer: Commercial Managed Care - HMO | Admitting: Physician Assistant

## 2015-05-29 ENCOUNTER — Encounter: Payer: Self-pay | Admitting: Physician Assistant

## 2015-05-29 VITALS — BP 138/82 | HR 100 | Ht 64.17 in | Wt 191.4 lb

## 2015-05-29 DIAGNOSIS — D62 Acute posthemorrhagic anemia: Secondary | ICD-10-CM | POA: Diagnosis not present

## 2015-05-29 DIAGNOSIS — K219 Gastro-esophageal reflux disease without esophagitis: Secondary | ICD-10-CM | POA: Diagnosis not present

## 2015-05-29 DIAGNOSIS — R12 Heartburn: Secondary | ICD-10-CM | POA: Diagnosis not present

## 2015-05-29 DIAGNOSIS — K5731 Diverticulosis of large intestine without perforation or abscess with bleeding: Secondary | ICD-10-CM | POA: Diagnosis not present

## 2015-05-29 NOTE — Progress Notes (Signed)
Reviewed and agree with management. Robert D. Kaplan, M.D., FACG  

## 2015-05-29 NOTE — Patient Instructions (Addendum)
Come to our lab, basement level on 06-09-2015. The orders are in, give them your name and date of birth. Continue with Iron supplement twice daily x 3 months. We will do a prior authorization with insurance for Nexium 40 mg, 1 tab daily.    You have been scheduled for an endoscopy. Please follow written instructions given to you at your visit today. If you use inhalers (even only as needed), please bring them with you on the day of your procedure. Your physician has requested that you go to www.[email or web address].[email or web address] and enter the access code given to you at your visit today. This web site gives a general overview about your procedure. However, you should still follow specific instructions given to you by our office regarding your preparation for the procedure.

## 2015-05-29 NOTE — Progress Notes (Signed)
Patient ID: [name], female   DOB: 03/29/1956, 59 y.o.   MRN: [id number]   Subjective:    Patient ID: [name], female    DOB: [birth year], 59 y.o.   MRN: [id number]  HPI [name] is a pleasant 59 year old African-American female known to Dr. Deatra Ina. She has history of recurrent diverticular bleeding. She was most recently hospitalized July 11 with recurrent diverticular bleed and required 2 units of packed RBCs. She also had an admission in March and underwent colonoscopy in January 2016 which showed moderate pandiverticulosis. Patient is status post gastric bypass in 2013, has hypertension and prior history of CVA for which she is maintained on a baby aspirin. Patient comes back in today for post hospital follow-up. Her hemoglobin reached a low of 6.6 with her most recent admission and last hemoglobin checked on July 25 was 7.4. Patient has not had any evidence of bleeding since last hospitalization. She is on an oral iron supplement which she says she is tolerating fine. Her main complaint is fatigue She also has been having a lot of heartburn and indigestion she says worse over the past month. She has been on Nexium 40 mg daily but says that the generic does not work nearly as well as the regular brand Nexium. She would like to be switched back to brand name Nexium. Her PCP added Carafate 4 times daily which does seem to be helping. She has no complaints of dysphagia. She has not had a prior EGD.  Review of Systems Pertinent positive and negative review of systems were noted in the above HPI section.  All other review of systems was otherwise negative.  Outpatient Encounter Prescriptions as of 05/29/2015  Medication Sig  . atorvastatin (LIPITOR) 40 MG tablet Take 1 tablet (40 mg total) by mouth daily.  . calcium carbonate (OS-CAL) 600 MG TABS tablet Take 1 tablet (600 mg total) by mouth 2 (two) times daily with a meal.  . Cetirizine HCl (ZYRTEC ALLERGY) 10 MG CAPS Take 1 capsule (10 mg  total) by mouth daily.  Marland Kitchen docusate sodium (COLACE) 100 MG capsule Take 1 capsule (100 mg total) by mouth 2 (two) times daily as needed. For constipation  . esomeprazole (NEXIUM) 40 MG capsule Take 1 capsule (40 mg total) by mouth daily.  . ferrous sulfate 220 (44 FE) MG/5ML solution Take 268.4 mg by mouth 2 (two) times daily with a meal. 6.76ml twice daily  . fluticasone (FLONASE) 50 MCG/ACT nasal spray Place 2 sprays into both nostrils at bedtime.  Marland Kitchen lisinopril (PRINIVIL,ZESTRIL) 20 MG tablet Take 1 tablet (20 mg total) by mouth daily.  . Multiple Vitamin (MULTIVITAMIN) tablet Take 1 tablet by mouth daily.  . polyethylene glycol powder (GLYCOLAX/MIRALAX) powder TAKE 17G BY MOUTH TWICE DAILY  . potassium chloride SA (K-DUR,KLOR-CON) 20 MEQ tablet Take two tablets for a one time dose.  . sucralfate (CARAFATE) 1 GM/10ML suspension Take 10 mLs (1 g total) by mouth 4 (four) times daily -  with meals and at bedtime.  . topiramate (TOPAMAX) 50 MG tablet Take 50 mg by mouth daily with supper.   . Vitamins A & D 5000-400 UNITS CAPS Take 1 tablet by mouth daily.  Marland Kitchen zolpidem (AMBIEN) 10 MG tablet TAKE ONE TABLET AT BEDTIME AS NEEDED FOR SLEEP   No facility-administered encounter medications on file as of 05/29/2015.   Allergies  Allergen Reactions  . Aspirin Other (See Comments)    GI BLEED  . Nsaids Other (See Comments)  GI BLEED   Patient Active Problem List   Diagnosis Date Noted  . Tension type headache 05/26/2015  . Muscle pain 05/26/2015  . GI bleed 05/12/2015  . Chest pain 05/12/2015  . Acute blood loss anemia   . Lower GI bleed   . Hypokalemia 03/18/2015  . Lower GI bleeding   . Diverticulosis of colon without hemorrhage 11/18/2014  . Blood in stool 10/07/2014  . Pain in joint, shoulder region 07/25/2014  . Muscle cramps 04/22/2014  . Biliary sludge 04/08/2014  . Bunion 03/28/2014  . Foot pain, bilateral 01/11/2014  . Constipation 10/19/2013  . Subacromial bursitis 05/21/2013    . Eczema 05/21/2013  . External hemorrhoids without complication 67/59/1638  . Insomnia 08/29/2012  . GERD (gastroesophageal reflux disease) 08/24/2012  . S/P gastric bypass 06/07/2012  . Asthma, cough variant 03/10/2012  . Diverticulosis of colon with hemorrhage   . Hypopigmented skin lesion 02/23/2011  . 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 Allergic rhinitis 05/27/2010  . POSTMENOPAUSAL STATUS 04/07/2010  . LIBIDO, DECREASED 10/16/2009  . H/O type 2 diabetes mellitus 01/25/2008  . CYST, KIDNEY, ACQUIRED 08/15/2007  . CEREBROVASCULAR ACCIDENT, HX OF 07/26/2007  . HTN, goal below 140/90 05/12/2007  . OBESITY, NOS 12/29/2006  . ANEMIA, IRON DEFICIENCY, UNSPEC. 12/29/2006  . Venous (peripheral) insufficiency 12/29/2006   History   Social History  . Marital Status: Divorced    Spouse Name: N/A  . Number of Children: 1  . 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 Smoking status: Never Smoker   . Smokeless tobacco: Never Used  . Alcohol Use: 0.0 oz/week    0 Standard drinks or equivalent per week     Comment: occasional wine  . Drug Use: No  . Sexual Activity: Not on file   Other Topics Concern  . Not on file   Social History Narrative    Ms. Heizer's family history includes Cancer (age of onset: 66) in her father; Colon cancer in her father; Diabetes in her brother, brother, brother, brother, mother, sister, and sister; Heart disease in her sister; Hypertension in her mother; Stroke in her brother and sister.      Objective:    Filed Vitals:   05/29/15 1431  BP: 138/82  Pulse: 100    Physical Exam  well-developed older African-American female in no acute distress, pleasant blood pressure 138/82 pulse 100 height 5 foot 4 weight 191. HEENT; nontraumatic normocephalic EOMI PERRLA sclera anicteric Neck supple no JVD, Cardiovascular; regular rate and rhythm with S1-S2 no murmur rub or gallop, Pulmonary ;clear  bilaterally, Abdomen; soft nontender nondistended bowel sounds are active there is no palpable mass or hepatosplenomegaly, Rectal ;exam not done, Ext; no clubbing cyanosis or edema skin warm and dry, Neuropsych ;mood and affect appropriate       Assessment & Plan:   #1 59 yo female with hx of recurrent Diverticular bleeds- 2 prior admits this year the last 2 weeks ago #2 acute blood loss anemia- secondary to above #3 fatigue #4 GERD- poorly controlled currently #5 hx of CVA #6 s/p Gastric bypass #7 AODM  Plan;  Repeat hgb in 2 weeks and follow until significant improvement  Continue liquid iron supplement BID x 3 months- then plan to follow up iron studies  Continue Nexium 40 mg po daily- will try to get authorized for brand name Nexium  Continue Carafate x 2 more weeks  Schedule for  EGD with Dr. Deatra Ina - procedure discussed with pt and she is agreeable to proceed Discussed surgical option with pt briefly- she has pan diverticular disease so would require subtotal colectomy if surgery undertaken- she is not ready to pursue at this time.  Amy S Esterwood PA-C 05/29/2015   Cc: [name], MD

## 2015-06-02 ENCOUNTER — Telehealth: Payer: Self-pay

## 2015-06-02 ENCOUNTER — Other Ambulatory Visit: Payer: Self-pay

## 2015-06-02 ENCOUNTER — Ambulatory Visit (AMBULATORY_SURGERY_CENTER): Payer: Commercial Managed Care - HMO | Admitting: Gastroenterology

## 2015-06-02 ENCOUNTER — Encounter: Payer: Self-pay | Admitting: Gastroenterology

## 2015-06-02 VITALS — BP 148/94 | HR 73 | Temp 96.8°F | Resp 20 | Ht 64.0 in | Wt 191.0 lb

## 2015-06-02 DIAGNOSIS — D649 Anemia, unspecified: Secondary | ICD-10-CM | POA: Diagnosis not present

## 2015-06-02 DIAGNOSIS — E669 Obesity, unspecified: Secondary | ICD-10-CM | POA: Diagnosis not present

## 2015-06-02 DIAGNOSIS — K219 Gastro-esophageal reflux disease without esophagitis: Secondary | ICD-10-CM | POA: Diagnosis not present

## 2015-06-02 DIAGNOSIS — K317 Polyp of stomach and duodenum: Secondary | ICD-10-CM

## 2015-06-02 DIAGNOSIS — I10 Essential (primary) hypertension: Secondary | ICD-10-CM | POA: Diagnosis not present

## 2015-06-02 DIAGNOSIS — R12 Heartburn: Secondary | ICD-10-CM | POA: Diagnosis not present

## 2015-06-02 DIAGNOSIS — D62 Acute posthemorrhagic anemia: Secondary | ICD-10-CM | POA: Diagnosis present

## 2015-06-02 DIAGNOSIS — Z8673 Personal history of transient ischemic attack (TIA), and cerebral infarction without residual deficits: Secondary | ICD-10-CM | POA: Diagnosis not present

## 2015-06-02 DIAGNOSIS — K289 Gastrojejunal ulcer, unspecified as acute or chronic, without hemorrhage or perforation: Secondary | ICD-10-CM

## 2015-06-02 MED ORDER — ESOMEPRAZOLE MAGNESIUM 40 MG PO CPDR: 40.0000 mg | DELAYED_RELEASE_CAPSULE | Freq: Every day | ORAL | Status: DC

## 2015-06-02 MED ORDER — SODIUM CHLORIDE 0.9 % IV SOLN
500.0000 mL | INTRAVENOUS | Status: DC
Start: 2015-06-02 — End: 2015-06-03

## 2015-06-02 NOTE — Progress Notes (Signed)
To recovery, report to Myers, RN, VSS. 

## 2015-06-02 NOTE — Op Note (Signed)
[facility]  [address]. [place], [id number]   ENDOSCOPY PROCEDURE REPORT  PATIENT: [name], [name]  MR#: [id number] BIRTHDATE: October 13, 1956 , 75  yrs. old GENDER: female ENDOSCOPIST: Inda Castle, MD REFERRED BY: PROCEDURE DATE:  06/02/2015 PROCEDURE:  EGD, diagnostic ASA CLASS:     Class II INDICATIONS:  heartburn. MEDICATIONS: Monitored anesthesia care and Propofol 140 mg IV TOPICAL ANESTHETIC:  DESCRIPTION OF PROCEDURE: After the risks benefits and alternatives of the procedure were thoroughly explained, informed consent was obtained.  The LB [id number] endoscope was introduced through the mouth and advanced to the proximal jejunum , Without limitations.  The instrument was slowly withdrawn as the mucosa was fully examined.    Superficial ulceration at anastomosis.   Gastric polyp.   At the suture line of the anastomosis there is a very superficial linear ulcer.  Base is clean. A single 3 mm fundic appearing polyp is present in the gastric fundus.   Except for the findings listed, the EGD was otherwise normal.  Retroflexion was not performed.     The scope was then withdrawn from the patient and the procedure completed.  COMPLICATIONS: There were no immediate complications.  ENDOSCOPIC IMPRESSION: 1. superficial linear ulceration at the anastomosis 2.  Benign fundic polyp  RECOMMENDATIONS: since reflux symptoms are well controlled Carafate alone will continue.  No need to add Nexium.  REPEAT EXAM:  eSigned:  Inda Castle, MD 06/02/2015 10:19 AM    CC: Dossie Arbour, MD

## 2015-06-02 NOTE — Patient Instructions (Signed)
YOU HAD AN ENDOSCOPIC PROCEDURE TODAY AT THE [facility]:   Refer to the procedure report that was given to you for any specific questions about what was found during the examination.  If the procedure report does not answer your questions, please call your gastroenterologist to clarify.  If you requested that your care partner not be given the details of your procedure findings, then the procedure report has been included in a sealed envelope for you to review at your convenience later.  YOU SHOULD EXPECT: Some feelings of bloating in the abdomen. Passage of more gas than usual.  Walking can help get rid of the air that was put into your GI tract during the procedure and reduce the bloating. If you had a lower endoscopy (such as a colonoscopy or flexible sigmoidoscopy) you may notice spotting of blood in your stool or on the toilet paper. If you underwent a bowel prep for your procedure, you may not have a normal bowel movement for a few days.  Please Note:  You might notice some irritation and congestion in your nose or some drainage.  This is from the oxygen used during your procedure.  There is no need for concern and it should clear up in a day or so.  SYMPTOMS TO REPORT IMMEDIATELY:    Following upper endoscopy (EGD)  Vomiting of blood or coffee ground material  New chest pain or pain under the shoulder blades  Painful or persistently difficult swallowing  New shortness of breath  Fever of 100F or higher  Black, tarry-looking stools  For urgent or emergent issues, a gastroenterologist can be reached at any hour by calling [phone].   DIET: Your first meal following the procedure should be a small meal and then it is ok to progress to your normal diet. Heavy or fried foods are harder to digest and may make you feel nauseous or bloated.  Likewise, meals heavy in dairy and vegetables can increase bloating.  Drink plenty of fluids but you should avoid alcoholic beverages  for 24 hours.  ACTIVITY:  You should plan to take it easy for the rest of today and you should NOT DRIVE or use heavy machinery until tomorrow (because of the sedation medicines used during the test).    FOLLOW UP: Our staff will call the number listed on your records the next business day following your procedure to check on you and address any questions or concerns that you may have regarding the information given to you following your procedure. If we do not reach you, we will leave a message.  However, if you are feeling well and you are not experiencing any problems, there is no need to return our call.  We will assume that you have returned to your regular daily activities without incident.  If any biopsies were taken you will be contacted by phone or by letter within the next 1-3 weeks.  Please call us at [phone] if you have not heard about the biopsies in 3 weeks.    SIGNATURES/CONFIDENTIALITY: You and/or your care partner have signed paperwork which will be entered into your electronic medical record.  These signatures attest to the fact that that the information above on your After Visit Summary has been reviewed and is understood.  Full responsibility of the confidentiality of this discharge information lies with you and/or your care-partner. 

## 2015-06-02 NOTE — Telephone Encounter (Signed)
Script sent to pharmacy for name brand nexium, pharmacy will call for prior auth when needed.

## 2015-06-02 NOTE — Telephone Encounter (Signed)
Completed a prior auth for Nexium through Cover My Med[employer].    Request come back saying prior auth not required

## 2015-06-02 NOTE — Telephone Encounter (Signed)
-----   Message from Alfredia Ferguson, PA-C sent at 05/29/2015  4:13 PM EDT ----- Regarding: get auth for med I saw this pt today- she is on generic nexium 40 mg daily which does not work for her nearly as well as brand nexium 40 mg . Can you please try to get her authorized for branded nexium  With a year refills - thank- you

## 2015-06-03 ENCOUNTER — Telehealth: Payer: Self-pay

## 2015-06-03 NOTE — Telephone Encounter (Signed)
  Follow up Call-  Call back number 06/02/2015 11/08/2014 05/08/2013  Post procedure Call Back phone  # [phone]  Permission to leave phone message Yes Yes Yes     Patient questions:  Do you have a fever, pain , or abdominal swelling? No. Pain Score  0 *  Have you tolerated food without any problems? Yes.    Have you been able to return to your normal activities? Yes.    Do you have any questions about your discharge instructions: Diet   No. Medications  No. Follow up visit  No.  Do you have questions or concerns about your Care? No.  Actions: * If pain score is 4 or above: No action needed, pain <4.

## 2015-06-11 ENCOUNTER — Telehealth: Payer: Self-pay | Admitting: Gastroenterology

## 2015-06-12 ENCOUNTER — Other Ambulatory Visit: Payer: Self-pay

## 2015-06-12 DIAGNOSIS — R1314 Dysphagia, pharyngoesophageal phase: Secondary | ICD-10-CM

## 2015-06-12 NOTE — Telephone Encounter (Signed)
She has not been able to get her insurance to cover the Nexium. She doesn't feel the generic is working very well. She wants to switch. Form obtained from her insurance.

## 2015-06-12 NOTE — Telephone Encounter (Signed)
Form faxed to [facility].  The patient reports a sensation of food sticking after she swallows. She feels liquids too. She remedies this by making herself vomit. She is on Carafate and Esomeprazole. Recent EGD. Please advise.

## 2015-06-12 NOTE — Telephone Encounter (Signed)
Please order barium swallow

## 2015-06-12 NOTE — Telephone Encounter (Signed)
Patient agrees to this test. Instructed to go to the [facility] on 06/18/15 at 10:45 for an 11:00 am appointment. Fast for 3 hours prior to test.

## 2015-06-17 ENCOUNTER — Telehealth: Payer: Self-pay | Admitting: Gastroenterology

## 2015-06-17 NOTE — Telephone Encounter (Signed)
Per pt disregard message she already rescheduled her appointment

## 2015-06-18 ENCOUNTER — Ambulatory Visit (HOSPITAL_COMMUNITY): Payer: Commercial Managed Care - HMO

## 2015-06-19 ENCOUNTER — Other Ambulatory Visit: Payer: Self-pay | Admitting: Family Medicine

## 2015-06-30 ENCOUNTER — Ambulatory Visit (HOSPITAL_COMMUNITY)
Admission: RE | Admit: 2015-06-30 | Discharge: 2015-06-30 | Disposition: A | Discharge status: Home / Self Care | Payer: Commercial Managed Care - HMO | Source: Ambulatory Visit | Attending: Gastroenterology | Admitting: Gastroenterology

## 2015-06-30 DIAGNOSIS — R1310 Dysphagia, unspecified: Secondary | ICD-10-CM | POA: Insufficient documentation

## 2015-06-30 DIAGNOSIS — K224 Dyskinesia of esophagus: Secondary | ICD-10-CM | POA: Insufficient documentation

## 2015-06-30 DIAGNOSIS — K219 Gastro-esophageal reflux disease without esophagitis: Secondary | ICD-10-CM | POA: Diagnosis not present

## 2015-06-30 DIAGNOSIS — R1314 Dysphagia, pharyngoesophageal phase: Secondary | ICD-10-CM

## 2015-07-05 ENCOUNTER — Inpatient Hospital Stay (HOSPITAL_COMMUNITY)
Admission: EM | Admit: 2015-07-05 | Discharge: 2015-07-09 | DRG: 378 | Disposition: A | Discharge status: Home / Self Care | Payer: Commercial Managed Care - HMO | Attending: Family Medicine | Admitting: Family Medicine

## 2015-07-05 ENCOUNTER — Encounter (HOSPITAL_COMMUNITY): Payer: Self-pay | Admitting: Nurse Practitioner

## 2015-07-05 DIAGNOSIS — D696 Thrombocytopenia, unspecified: Secondary | ICD-10-CM | POA: Diagnosis not present

## 2015-07-05 DIAGNOSIS — K922 Gastrointestinal hemorrhage, unspecified: Secondary | ICD-10-CM | POA: Diagnosis not present

## 2015-07-05 DIAGNOSIS — R109 Unspecified abdominal pain: Secondary | ICD-10-CM | POA: Diagnosis not present

## 2015-07-05 DIAGNOSIS — E119 Type 2 diabetes mellitus without complications: Secondary | ICD-10-CM | POA: Diagnosis not present

## 2015-07-05 DIAGNOSIS — K5733 Diverticulitis of large intestine without perforation or abscess with bleeding: Secondary | ICD-10-CM | POA: Diagnosis not present

## 2015-07-05 DIAGNOSIS — Z7951 Long term (current) use of inhaled steroids: Secondary | ICD-10-CM

## 2015-07-05 DIAGNOSIS — E876 Hypokalemia: Secondary | ICD-10-CM | POA: Diagnosis not present

## 2015-07-05 DIAGNOSIS — D62 Acute posthemorrhagic anemia: Secondary | ICD-10-CM | POA: Diagnosis not present

## 2015-07-05 DIAGNOSIS — Z96653 Presence of artificial knee joint, bilateral: Secondary | ICD-10-CM | POA: Diagnosis present

## 2015-07-05 DIAGNOSIS — Z79899 Other long term (current) drug therapy: Secondary | ICD-10-CM | POA: Diagnosis not present

## 2015-07-05 DIAGNOSIS — R1011 Right upper quadrant pain: Secondary | ICD-10-CM | POA: Diagnosis not present

## 2015-07-05 DIAGNOSIS — K219 Gastro-esophageal reflux disease without esophagitis: Secondary | ICD-10-CM | POA: Diagnosis present

## 2015-07-05 DIAGNOSIS — R1084 Generalized abdominal pain: Secondary | ICD-10-CM | POA: Diagnosis not present

## 2015-07-05 DIAGNOSIS — D649 Anemia, unspecified: Secondary | ICD-10-CM | POA: Insufficient documentation

## 2015-07-05 DIAGNOSIS — E785 Hyperlipidemia, unspecified: Secondary | ICD-10-CM | POA: Diagnosis present

## 2015-07-05 DIAGNOSIS — Z9884 Bariatric surgery status: Secondary | ICD-10-CM | POA: Diagnosis not present

## 2015-07-05 DIAGNOSIS — K5731 Diverticulosis of large intestine without perforation or abscess with bleeding: Secondary | ICD-10-CM | POA: Diagnosis not present

## 2015-07-05 DIAGNOSIS — Z8673 Personal history of transient ischemic attack (TIA), and cerebral infarction without residual deficits: Secondary | ICD-10-CM

## 2015-07-05 DIAGNOSIS — I959 Hypotension, unspecified: Secondary | ICD-10-CM | POA: Diagnosis not present

## 2015-07-05 DIAGNOSIS — R51 Headache: Secondary | ICD-10-CM | POA: Diagnosis not present

## 2015-07-05 DIAGNOSIS — K5791 Diverticulosis of intestine, part unspecified, without perforation or abscess with bleeding: Principal | ICD-10-CM | POA: Diagnosis present

## 2015-07-05 DIAGNOSIS — K921 Melena: Secondary | ICD-10-CM | POA: Diagnosis not present

## 2015-07-05 DIAGNOSIS — I10 Essential (primary) hypertension: Secondary | ICD-10-CM | POA: Diagnosis present

## 2015-07-05 DIAGNOSIS — R1031 Right lower quadrant pain: Secondary | ICD-10-CM | POA: Diagnosis not present

## 2015-07-05 HISTORY — DX: Encounter for other specified aftercare: Z51.89

## 2015-07-05 HISTORY — DX: Diverticulosis of intestine, part unspecified, without perforation or abscess without bleeding: K57.90

## 2015-07-05 LAB — CBC
HCT: 21.9 % — ABNORMAL LOW (ref 36.0–46.0)
Hemoglobin: 6.6 g/dL — CL (ref 12.0–15.0)
MCH: 22.8 pg — ABNORMAL LOW (ref 26.0–34.0)
MCHC: 30.1 g/dL (ref 30.0–36.0)
MCV: 75.5 fL — ABNORMAL LOW (ref 78.0–100.0)
Platelets: 294 10*3/uL (ref 150–400)
RBC: 2.9 MIL/uL — ABNORMAL LOW (ref 3.87–5.11)
RDW: 18.4 % — ABNORMAL HIGH (ref 11.5–15.5)
WBC: 7.9 10*3/uL (ref 4.0–10.5)

## 2015-07-05 LAB — PROTIME-INR
INR: 1.17 (ref 0.00–1.49)
Prothrombin Time: 15.1 seconds (ref 11.6–15.2)

## 2015-07-05 LAB — MRSA PCR SCREENING: MRSA by PCR: NEGATIVE

## 2015-07-05 LAB — FIBRINOGEN: Fibrinogen: 401 mg/dL (ref 204–475)

## 2015-07-05 LAB — COMPREHENSIVE METABOLIC PANEL
ALT: 21 U/L (ref 14–54)
AST: 23 U/L (ref 15–41)
Albumin: 3.1 g/dL — ABNORMAL LOW (ref 3.5–5.0)
Alkaline Phosphatase: 67 U/L (ref 38–126)
Anion gap: 5 (ref 5–15)
BUN: 14 mg/dL (ref 6–20)
CO2: 24 mmol/L (ref 22–32)
Calcium: 8.6 mg/dL — ABNORMAL LOW (ref 8.9–10.3)
Chloride: 111 mmol/L (ref 101–111)
Creatinine, Ser: 0.69 mg/dL (ref 0.44–1.00)
GFR calc Af Amer: >60 mL/min (ref >60)
GFR calc non Af Amer: >60 mL/min (ref >60)
Glucose, Bld: 161 mg/dL — ABNORMAL HIGH (ref 65–99)
Potassium: 3.6 mmol/L (ref 3.5–5.1)
Sodium: 140 mmol/L (ref 135–145)
Total Bilirubin: 0.5 mg/dL (ref 0.3–1.2)
Total Protein: 5.8 g/dL — ABNORMAL LOW (ref 6.5–8.1)

## 2015-07-05 LAB — IRON AND TIBC
Iron: 10 ug/dL — ABNORMAL LOW (ref 28–170)
Saturation Ratios: 2 % — ABNORMAL LOW (ref 10.4–31.8)
TIBC: 459 ug/dL — ABNORMAL HIGH (ref 250–450)
UIBC: 449 ug/dL

## 2015-07-05 LAB — RETICULOCYTES
RBC.: 2.67 MIL/uL — ABNORMAL LOW (ref 3.87–5.11)
Retic Count, Absolute: 34.7 10*3/uL (ref 19.0–186.0)
Retic Ct Pct: 1.3 % (ref 0.4–3.1)

## 2015-07-05 LAB — VITAMIN B12: Vitamin B-12: 1273 pg/mL — ABNORMAL HIGH (ref 180–914)

## 2015-07-05 LAB — FERRITIN: Ferritin: 12 ng/mL (ref 11–307)

## 2015-07-05 LAB — POC OCCULT BLOOD, ED: Fecal Occult Bld: POSITIVE — AB

## 2015-07-05 LAB — PREPARE RBC (CROSSMATCH)

## 2015-07-05 LAB — APTT: aPTT: 28 seconds (ref 24–37)

## 2015-07-05 MED ORDER — ACETAMINOPHEN 325 MG PO TABS
650.0000 mg | ORAL_TABLET | Freq: Four times a day (QID) | ORAL | Status: DC | PRN
  Administered 2015-07-08: 650 mg via ORAL
  Filled 2015-07-05 (×2): qty 2

## 2015-07-05 MED ORDER — SODIUM CHLORIDE 0.9 % IV SOLN: Freq: Once | INTRAVENOUS | Status: DC

## 2015-07-05 MED ORDER — PANTOPRAZOLE SODIUM 40 MG IV SOLR
40.0000 mg | Freq: Two times a day (BID) | INTRAVENOUS | Status: DC
  Administered 2015-07-05: 40 mg via INTRAVENOUS
  Filled 2015-07-05: qty 40

## 2015-07-05 MED ORDER — ZOLPIDEM TARTRATE 5 MG PO TABS
5.0000 mg | ORAL_TABLET | Freq: Every evening | ORAL | Status: DC | PRN
  Administered 2015-07-05 – 2015-07-08 (×4): 5 mg via ORAL
  Filled 2015-07-05 (×4): qty 1

## 2015-07-05 MED ORDER — ONDANSETRON 4 MG PO TBDP
8.0000 mg | ORAL_TABLET | Freq: Three times a day (TID) | ORAL | Status: DC | PRN
  Administered 2015-07-05 – 2015-07-06 (×2): 8 mg via ORAL
  Filled 2015-07-05 (×2): qty 2

## 2015-07-05 MED ORDER — ATORVASTATIN CALCIUM 40 MG PO TABS
40.0000 mg | ORAL_TABLET | Freq: Every day | ORAL | Status: DC
  Administered 2015-07-06 – 2015-07-09 (×4): 40 mg via ORAL
  Filled 2015-07-05 (×4): qty 1

## 2015-07-05 MED ORDER — SODIUM CHLORIDE 0.9 % IV SOLN
INTRAVENOUS | Status: DC
  Administered 2015-07-05 – 2015-07-07 (×2): via INTRAVENOUS

## 2015-07-05 MED ORDER — TOPIRAMATE 25 MG PO TABS
50.0000 mg | ORAL_TABLET | Freq: Every day | ORAL | Status: DC
  Administered 2015-07-06: 50 mg via ORAL
  Filled 2015-07-05 (×2): qty 2

## 2015-07-05 MED ORDER — PANTOPRAZOLE SODIUM 40 MG IV SOLR
40.0000 mg | Freq: Once | INTRAVENOUS | Status: AC
  Administered 2015-07-05: 40 mg via INTRAVENOUS
  Filled 2015-07-05: qty 40

## 2015-07-05 NOTE — ED Provider Notes (Signed)
Medical screening examination/treatment/procedure(s) were conducted as a shared visit with non-physician practitioner(s) and myself.  I personally evaluated the patient during the encounter.   EKG Interpretation None       See the written copy of this report in the patient's paper medical record.  These results did not interface directly into the electronic medical record and are summarized here.  59 year old female with a chief complaint of bright red blood per rectum. Patient states that this been going on for the past couple days feeling lightheaded like she could pass out. Has a history of this previously and had a diverticular bleed. Feels like this is the same.  On exam patient with a nondistended and nontender abdomen. Well-appearing and nontoxic.  Initial hemoglobin 6.6. Patient currently hemodynamically stable, GI consult the medicine admit. Orders to transfuse blood.     Deno Etienne, DO 07/06/15 1512

## 2015-07-05 NOTE — H&P (Signed)
[facility] Admission History and Physical Service Pager: [phone]  [name] Medical record number: [id number] Date of birth: Jan 17, 1956 Age: 59 y.o. Gender: female  Primary Care Provider: [name], MD Consultants: GI Code Status: Full per discussion on admission   Chief Complaint: GI Bleed  Assessment and Plan: [name] is a 59 y.o. female presenting with GI bleed . PMH is significant for diverticular disease with previous GI bleed; history of diabetes, HLD and hypertension; status post gastric bypass surgery; h/o TIA.    GI bleed: BRBPR this morning. 3 bloody BMs. Likely due to diverticulosis, as it has been in the past. No inciting factors such as NSAIDs or blood thinners noted. Recent colonoscopy with Dr. Deatra Ina in 11/2014 at which time she was noted to have moderate diverticulosis in the ascending colon,the cecum, in the transverse colon, sigmoid colon, and descending colon. EGD Aug 2016 demonstrated superficial linear ulceration at the anastomosis and benign fundic polyp. Barium swallow Aug 2016 showed no evidence of high grade stricture or mass, no mucosal lesions, and mild GERD. She has had 3 GI bleeds this year and was last hospitalized in July. She's status post gastric bypass done in July 2013. INR stable at 1.17. Hemoglobin down to 6.6 (baseline appears to be ~8) - admit to SDU to monitor closely, attending Dr. Gwendlyn Deutscher - insert 2 large bore peripheral IVs - Hemodynamically stable at present; monitor VSS closely - orthostatic vitals ordered  - 2u pRBCs transfusionand f/u post transfusion H&H - GI consulted, appreciate recs - NPO pending GI evaluation - PPI IV BID - repeat CBC in AM  - tylenol 650mg  prn for abdominal pain - Iron labs, folate and B12 level ordered   - would recommend starting daily fiber supplement after discharge   HTN: BPs stable at 140s-120s/80-70s.   - Holding lisinopril; plan to restart on  9/4 - Continue to monitor BP   HLD  - continue home Lipitor   H/o DM: Last A1c 5.4 on 03/2015. Not on any medication.  - will monitor glucose on BMET  GERD - holding home Nexium while on IV Protonix BID  H/o TIA: Patient states there are no deficits from stoke and that she was unaware she had one until it was noted on imaging.   FEN/GI: NS @10  mL/hour to maintain IV access, NPO; Protonix  Prophylaxis: SCDs; No anticoagulation due to acute bleed  Disposition: SDU; Transfer to floor pending hemodynamic stability s/p transfusion;  FPTS Attending [place]   History of Present Illness:  [name] is a 59 y.o. female presenting with bright red blood per rectum. Bleeding was associated with 3 loose bowel movements, bleeding increased in amount with each subsequent BM. BRBPR was preceded by abdominal pain x 2 days. Abdominal pain located in lower quadrants. Denies epigastric pain. She reports feeling nauseous, but denies vomiting. Reports dizziness with standing but denies LOC or falls. Denies any headaches, chest pain, or change in appetite.   She has a history of lower GI bleeds. Did not take her daily ASA this AM. She denies NSAID use. Reports recent trip to [place] where she was very constipated x 1week. Normally has 2 BMs/day and takes colace daily. Does not take a fiber supplement.   Positive fecal occult blood in ED. HgB was noted to be low at 6.6.   Review Of Systems: Per HPI with the following additions: no dysuria, occasional SOB  Otherwise 12 point review of systems  was performed and was unremarkable.  Patient Active Problem List   Diagnosis Date Noted  . Tension type headache 05/26/2015  . Muscle pain 05/26/2015  . GI bleed 05/12/2015  . Chest pain 05/12/2015  . Acute blood loss anemia   . Lower GI bleed   . Hypokalemia 03/18/2015  . Lower GI bleeding   . Diverticulosis of colon without hemorrhage 11/18/2014  . Blood in stool 10/07/2014  . Pain in joint, shoulder region  07/25/2014  . Muscle cramps 04/22/2014  . Biliary sludge 04/08/2014  . Bunion 03/28/2014  . Foot pain, bilateral 01/11/2014  . Constipation 10/19/2013  . Subacromial bursitis 05/21/2013  . Eczema 05/21/2013  . External hemorrhoids without complication 11/94/1740  . Insomnia 08/29/2012  . GERD (gastroesophageal reflux disease) 08/24/2012  . S/P gastric bypass 06/07/2012  . Asthma, cough variant 03/10/2012  . Diverticulosis of colon with hemorrhage   . Hypopigmented skin lesion 02/23/2011  . 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 Allergic rhinitis 05/27/2010  . POSTMENOPAUSAL STATUS 04/07/2010  . LIBIDO, DECREASED 10/16/2009  . H/O type 2 diabetes mellitus 01/25/2008  . CYST, KIDNEY, ACQUIRED 08/15/2007  . CEREBROVASCULAR ACCIDENT, HX OF 07/26/2007  . HTN, goal below 140/90 05/12/2007  . OBESITY, NOS 12/29/2006  . ANEMIA, IRON DEFICIENCY, UNSPEC. 12/29/2006  . Venous (peripheral) insufficiency 12/29/2006   Past Medical History: Past Medical History  Diagnosis Date  . Anemia   . Mini stroke   . GERD (gastroesophageal reflux disease)   . Arthritis     knees  . Hypertension     under control with meds., has been on med. > 20 yr.  . High cholesterol   . Shoulder impingement 08/2013    right  . Rotator cuff rupture, complete 08/2013    right  . Degenerative arthritis of right shoulder region 08/2013  . Wears dentures     upper  . Wears partial dentures     lower  . History of GI bleed   . Back pain 10/04/2012  . Stroke 2006    Remote left lacunar infarct noted on CT head 2006, 2010   . Diverticulosis   . Blood transfusion without reported diagnosis    Past Surgical History: Past Surgical History  Procedure Laterality Date  . 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 Abdominal hysterectomy      partial  . Total knee arthroplasty Left   . Total knee arthroplasty Right   . Colonoscopy  05/02/2000; 05/08/2013  . Shoulder arthroscopy with rotator cuff repair and  subacromial decompression Right 08/09/2013    Procedure: RIGHT SHOULDER ARTHROSCOPY WITH SUBACROMIAL DECOMPRESSION, DISTAL CLAVICLE EXCISION AND ROTATOR CUFF REPAIR and release biceps;  Surgeon: Ninetta Lights, MD;  Location: [place];  Service: Orthopedics;  Laterality: Right;  . Cosmetic surgery  02/22/2014    skin removal surgery from lower abdomen    Social History: Social History  Substance Use Topics  . Smoking status: Never Smoker   . Smokeless tobacco: Never Used  . Alcohol Use: 0.0 oz/week    0 Standard drinks or equivalent per week     Comment: occasional wine   Additional social history: Denies smoking and drug use. Notes very occasional alcohol use "once in blue moon" that she is considering d/c due to GI issues.   Please also refer to relevant sections of EMR.  Family History: Family History  Problem Relation Age of Onset  . Cancer Father 43    Died from complications of colon CA  .  Diabetes Mother   . Hypertension Mother   . Diabetes Sister   . Heart disease Sister   . Diabetes Sister   . Stroke Sister   . Diabetes Brother   . Stroke Brother   . Diabetes Brother   . Diabetes Brother   . Diabetes Brother   . Colon cancer Father     died in his 7s   Allergies and Medications: Allergies  Allergen Reactions  . Aspirin Other (See Comments)    GI BLEED  . Nsaids Other (See Comments)    GI BLEED   No current facility-administered medications on file prior to encounter.   Current Outpatient Prescriptions on File Prior to Encounter  Medication Sig Dispense Refill  . atorvastatin (LIPITOR) 40 MG tablet Take 1 tablet (40 mg total) by mouth daily. 90 tablet 3  . calcium carbonate (OS-CAL) 600 MG TABS tablet Take 1 tablet (600 mg total) by mouth 2 (two) times daily with a meal. 180 tablet 1  . Cetirizine HCl (ZYRTEC ALLERGY) 10 MG CAPS Take 1 capsule (10 mg total) by mouth daily. (Patient taking differently: Take 1 capsule by mouth at bedtime. ) 30  capsule 2  . docusate sodium (COLACE) 100 MG capsule Take 1 capsule (100 mg total) by mouth 2 (two) times daily as needed. For constipation (Patient taking differently: Take 100-200 mg by mouth 2 (two) times daily. For constipation, take 200 mg in the am and 100 mg in the evening) 180 capsule 1  . ferrous sulfate 220 (44 FE) MG/5ML solution Take 268.4 mg by mouth 2 (two) times daily with a meal. 6.58ml twice daily    . fluticasone (FLONASE) 50 MCG/ACT nasal spray Place 2 sprays into both nostrils at bedtime. 16 g 6  . lisinopril (PRINIVIL,ZESTRIL) 20 MG tablet TAKE ONE TABLET BY MOUTH ONCE DAILY 30 tablet 2  . Multiple Vitamin (MULTIVITAMIN) tablet Take 1 tablet by mouth daily. 90 tablet 1  . polyethylene glycol powder (GLYCOLAX/MIRALAX) powder TAKE 17G BY MOUTH TWICE DAILY 850 g 1  . potassium chloride SA (K-DUR,KLOR-CON) 20 MEQ tablet Take two tablets for a one time dose. (Patient taking differently: Take 20 mEq by mouth 2 (two) times daily. ) 2 tablet 0  . sucralfate (CARAFATE) 1 GM/10ML suspension Take 10 mLs (1 g total) by mouth 4 (four) times daily -  with meals and at bedtime. 420 mL 1  . topiramate (TOPAMAX) 50 MG tablet Take 50 mg by mouth daily.     . Vitamins A & D 5000-400 UNITS CAPS Take 1 tablet by mouth daily.    Marland Kitchen zolpidem (AMBIEN) 10 MG tablet TAKE ONE TABLET AT BEDTIME AS NEEDED FOR SLEEP 30 tablet 1  . esomeprazole (NEXIUM) 40 MG capsule Take 1 capsule (40 mg total) by mouth daily. (Patient taking differently: Take 40 mg by mouth daily as needed (acid reflux, heartburn). ) 30 capsule 3    Objective: BP 142/76 mmHg  Pulse 86  Temp(Src) 98.4 F (36.9 C) (Oral)  Resp 16  SpO2 100% Exam: General: WDWN female lying in bed in NAD Eyes: EOMI, pallor of conjunctiva  ENTM: Oropharynx clear. Poor dentition.  Neck: Full ROM. Cardiovascular: RRR. 2/6 systolic murmur. Distal pulses 2+; Cap Refill < 2 secs Respiratory: CTAB. Normal WOB. Abdomen: +BS, soft, no distension or masses  palpated, TTP in RLQ and LLQ, no rebound or guarding  MSK: 2+ edema in LE bilaterally to mid-calf  Skin: warm and dry  Neuro: Alert and oriented. No gross  deficits.  Psych: Normal mood and affect.   Labs and Imaging: CBC BMET   Recent Labs Lab 07/05/15 1306  WBC 7.9  HGB 6.6*  HCT 21.9*  PLT 294    Recent Labs Lab 07/05/15 1306  NA 140  K 3.6  CL 111  CO2 24  BUN 14  CREATININE 0.69  GLUCOSE 161*  CALCIUM 8.6*       Ref Range 1:40 PM    Prothrombin Time 11.6 - 15.2 seconds 15.1   INR 0.00 - 1.49  1.17        Fibrinogen 204 - 475 mg/dL 401         Ref Range 1:39 PM    Fecal Occult Bld NEGATIVE  POSITIVE (A)        Iron/TIBC/Ferritin/ %Sat    Component Value Date/Time   IRON 10* 07/05/2015 1452   TIBC 459* 07/05/2015 1452   FERRITIN 12 07/05/2015 1452   IRONPCTSAT 2* 07/05/2015 1452   IRONPCTSAT 30 06/23/2012 1118    Dg Esophagus  06/30/2015   CLINICAL DATA:  Patient status post Roux-en-Y gastric bypass bariatric surgery. Continue dysphagia and feeling of food retention in the esophagus.  EXAM: ESOPHOGRAM / BARIUM SWALLOW / BARIUM TABLET STUDY  TECHNIQUE: Combined double contrast and single contrast examination performed using effervescent crystals, thick barium liquid, and thin barium liquid. The patient was observed with fluoroscopy swallowing a 13 mm barium sulphate tablet.  FLUOROSCOPY TIME:  Radiation Exposure Index (as provided by the fluoroscopic device):  If the device does not provide the exposure index:  Fluoroscopy Time:  1 minutes 51 seconds  Number of Acquired Images:  22  COMPARISON:  CT 09/24/2012  FINDINGS: The esophagus is patulous leading up to the GE junction. No high-grade stricture obstruction at the GE junction. Some retained secretions within the thoracic esophagus which clears ultimately on subsequent imaging.  With patient in prone position, there is escape of the barium bolus from the primary stripping wave and to ad fro motion  within the patulous esophagus. 13 mm barium tablet passed the GE junction easily.  Mild gastroesophageal reflux noted.  IMPRESSION: 1. No evidence of high-grade stricture or mass within the esophagus. 2. 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 3. No mucosal lesion identified. Some retained secretions are noted within the esophagus. 4. Mild gastroesophageal reflux noted.   Electronically Signed   By: Suzy Bouchard M.D.   On: 06/30/2015 11:56    Nicolette Bang, DO 07/05/2015, 2:36 PM PGY-1, [facility] Intern pager: [phone], text pages welcome  I have read and agree with the amended note as above. My revisions are noted in [place], MD, PGY-3 7:51 PM

## 2015-07-05 NOTE — ED Notes (Signed)
She reports bright red blood filling toilet bowl with 3 bowel movements this morning. She denies pain but she is feeling weak and dizzy. She did notice some abd pain intermittently over past few days. She has nausea but no vomiting. Sh ereports hx diverticulosis and GI bleeding

## 2015-07-05 NOTE — ED Provider Notes (Signed)
CSN: [id number]     Arrival date & time 07/05/15  1232 History   First MD Initiated Contact with Patient 07/05/15 1301     Chief Complaint  Patient presents with  . GI Bleeding     (Consider location/radiation/quality/duration/timing/severity/associated sxs/prior Treatment) HPI Comments: Patient with a history of recurrent Diverticular lower GI bleeds presents today with a chief complaint of hematochezia.  She states that this morning she had three episodes of bright red blood with BM.  She states that the blood was mixed in with the stool.  No rectal bleeding in between bowel movements.  She denies melena.  She reports associated nausea, but denies vomiting.  She also reports associated generalized weakness and dizziness.  She reports that symptoms are similar to symptoms that she has had in the past with a Diverticular bleed.  She reports that she has had mild epigastric abdominal pain intermittently over the past couple of days, but states that the pain is now located on the left side of the abdomen.  Pain is mild.  She has not taken anything for pain prior to arrival.  She states that she has required blood transfusions secondary to GI bleeds in the past.  She is currently not on any anticoagulants aside from 81 mg ASA.  She denies vomiting, chest pain, SOB, melena, fever, chills, or syncope. Recent colonoscopy with Dr. Deatra Ina in 11/2014 at which time she was noted to have moderate diverticulosis in the ascending colon,the cecum, in the transverse colon, sigmoid colon, and descending colon. EGD Aug 2016 demonstrated superficial linear ulceration at the anastomosis and benign fundic polyp.   The history is provided by the patient.    Past Medical History  Diagnosis Date  . Anemia   . Mini stroke   . GERD (gastroesophageal reflux disease)   . Arthritis     knees  . Hypertension     under control with meds., has been on med. > 20 yr.  . High cholesterol   . Shoulder impingement 08/2013     right  . Rotator cuff rupture, complete 08/2013    right  . Degenerative arthritis of right shoulder region 08/2013  . Wears dentures     upper  . Wears partial dentures     lower  . History of GI bleed   . Back pain 10/04/2012  . Stroke 2006    Remote left lacunar infarct noted on CT head 2006, 2010   . Diverticulosis   . Blood transfusion without reported diagnosis    Past Surgical History  Procedure Laterality Date  . 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 Abdominal hysterectomy      partial  . Total knee arthroplasty Left   . Total knee arthroplasty Right   . Colonoscopy  05/02/2000; 05/08/2013  . Shoulder arthroscopy with rotator cuff repair and subacromial decompression Right 08/09/2013    Procedure: RIGHT SHOULDER ARTHROSCOPY WITH SUBACROMIAL DECOMPRESSION, DISTAL CLAVICLE EXCISION AND ROTATOR CUFF REPAIR and release biceps;  Surgeon: Ninetta Lights, MD;  Location: [place];  Service: Orthopedics;  Laterality: Right;  . Cosmetic surgery  02/22/2014    skin removal surgery from lower abdomen    Family History  Problem Relation Age of Onset  . Cancer Father 14    Died from complications of colon CA  . Diabetes Mother   . Hypertension Mother   . Diabetes Sister   . Heart disease Sister   . Diabetes Sister   . Stroke Sister   .  Diabetes Brother   . Stroke Brother   . Diabetes Brother   . Diabetes Brother   . Diabetes Brother   . Colon cancer Father     died in his 62s   Social History  Substance Use Topics  . Smoking status: Never Smoker   . Smokeless tobacco: Never Used  . Alcohol Use: 0.0 oz/week    0 Standard drinks or equivalent per week     Comment: occasional wine   OB History    No data available     Review of Systems  All other systems reviewed and are negative.     Allergies  Aspirin and Nsaids  Home Medications   Prior to Admission medications   Medication Sig Start Date End Date Taking? Authorizing Provider  atorvastatin  (LIPITOR) 40 MG tablet Take 1 tablet (40 mg total) by mouth daily. 08/19/14   Bernadene Bell, MD  calcium carbonate (OS-CAL) 600 MG TABS tablet Take 1 tablet (600 mg total) by mouth 2 (two) times daily with a meal. 01/07/14   Boykin Nearing, MD  Cetirizine HCl (ZYRTEC ALLERGY) 10 MG CAPS Take 1 capsule (10 mg total) by mouth daily. 03/17/15   [name], MD  docusate sodium (COLACE) 100 MG capsule Take 1 capsule (100 mg total) by mouth 2 (two) times daily as needed. For constipation 08/19/14   Bernadene Bell, MD  esomeprazole (NEXIUM) 40 MG capsule Take 1 capsule (40 mg total) by mouth daily. 06/02/15   Amy S Esterwood, PA-C  ferrous sulfate 220 (44 FE) MG/5ML solution Take 268.4 mg by mouth 2 (two) times daily with a meal. 6.7ml twice daily    Historical Provider, MD  fluticasone (FLONASE) 50 MCG/ACT nasal spray Place 2 sprays into both nostrils at bedtime. 01/07/14   Boykin Nearing, MD  lisinopril (PRINIVIL,ZESTRIL) 20 MG tablet TAKE ONE TABLET BY MOUTH ONCE DAILY 06/20/15   [name], MD  Multiple Vitamin (MULTIVITAMIN) tablet Take 1 tablet by mouth daily. 01/07/14   Josalyn Funches, MD  polyethylene glycol powder (GLYCOLAX/MIRALAX) powder TAKE 17G BY MOUTH TWICE DAILY 11/07/14   [name], MD  potassium chloride SA (K-DUR,KLOR-CON) 20 MEQ tablet Take two tablets for a one time dose. 03/18/15   [name], MD  sucralfate (CARAFATE) 1 GM/10ML suspension Take 10 mLs (1 g total) by mouth 4 (four) times daily -  with meals and at bedtime. 05/26/15   [name], MD  topiramate (TOPAMAX) 50 MG tablet Take 50 mg by mouth daily with supper.  12/26/14 12/26/15  Historical Provider, MD  Vitamins A & D 5000-400 UNITS CAPS Take 1 tablet by mouth daily. 02/18/15   Historical Provider, MD  zolpidem (AMBIEN) 10 MG tablet TAKE ONE TABLET AT BEDTIME AS NEEDED FOR SLEEP 05/26/15   [name], MD   BP 131/76 mmHg  Pulse 87  Temp(Src) 98.4 F (36.9 C) (Oral)  Resp 16  SpO2 100% Physical Exam   Constitutional: She appears well-developed and well-nourished. No distress.  HENT:  Head: Normocephalic and atraumatic.  Mouth/Throat: Oropharynx is clear and moist.  Neck: Normal range of motion. Neck supple.  Cardiovascular: Normal rate, regular rhythm and normal heart sounds.   Pulmonary/Chest: Effort normal and breath sounds normal.  Abdominal: Soft. Bowel sounds are normal. She exhibits no distension and no mass. There is tenderness in the right lower quadrant, left upper quadrant and left lower quadrant. There is no rebound and no guarding.  Genitourinary: Guaiac positive  stool.  Dark red blood noted on rectal exam  Musculoskeletal: Normal range of motion.  Neurological: She is alert.  Skin: Skin is warm and dry. She is not diaphoretic.  Psychiatric: She has a normal mood and affect.  Nursing note and vitals reviewed.   ED Course  Procedures (including critical care time) Labs Review Labs Reviewed  COMPREHENSIVE METABOLIC PANEL  CBC  POC OCCULT BLOOD, ED  TYPE AND SCREEN    Imaging Review No results found. I have personally reviewed and evaluated these images and lab results as part of my medical decision-making.   EKG Interpretation None      CRITICAL CARE Performed by: Hyman Bible   Total critical care time: 30  Critical care time was exclusive of separately billable procedures and treating other patients.  Critical care was necessary to treat or prevent imminent or life-threatening deterioration.  Critical care was time spent personally by me on the following activities: development of treatment plan with patient and/or surrogate as well as nursing, discussions with consultants, evaluation of patient's response to treatment, examination of patient, obtaining history from patient or surrogate, ordering and performing treatments and interventions, ordering and review of laboratory studies, ordering and review of radiographic studies, pulse oximetry and  re-evaluation of patient's condition.    2:19 PM Discussed with Dr. Hilarie Fredrickson with GI who has agreed to consult and recommended admission to medicine.   2:30 PM Discussed with Family Medicine resident who has agreed to admit the patient.     Final diagnoses:  None   Patient with a history of recurrent diverticular bleeds presents today with a chief complaint of BRBPR.   She is hemodynamically stable.  Labs today showing a hemoglobin of 6.6.  Patient with gross rectal bleeding on exam.  Two units PRBC's ordered.  GI consulted who agreed to consult.  Patient admitted to [facility] Medicine Teaching Service.      Hyman Bible, PA-C 07/05/15 [place], DO 07/06/15 1512

## 2015-07-06 ENCOUNTER — Encounter (HOSPITAL_COMMUNITY): Payer: Self-pay | Admitting: Radiology

## 2015-07-06 ENCOUNTER — Inpatient Hospital Stay (HOSPITAL_COMMUNITY): Payer: Commercial Managed Care - HMO

## 2015-07-06 DIAGNOSIS — D649 Anemia, unspecified: Secondary | ICD-10-CM | POA: Insufficient documentation

## 2015-07-06 DIAGNOSIS — R109 Unspecified abdominal pain: Secondary | ICD-10-CM | POA: Insufficient documentation

## 2015-07-06 DIAGNOSIS — D62 Acute posthemorrhagic anemia: Secondary | ICD-10-CM

## 2015-07-06 DIAGNOSIS — K5731 Diverticulosis of large intestine without perforation or abscess with bleeding: Secondary | ICD-10-CM

## 2015-07-06 LAB — CBC
HCT: 18.9 % — ABNORMAL LOW (ref 36.0–46.0)
HCT: 21.5 % — ABNORMAL LOW (ref 36.0–46.0)
HCT: 21.2 % — ABNORMAL LOW (ref 36.0–46.0)
Hemoglobin: 5.8 g/dL — CL (ref 12.0–15.0)
Hemoglobin: 6.9 g/dL — CL (ref 12.0–15.0)
Hemoglobin: 6.8 g/dL — CL (ref 12.0–15.0)
MCH: 24.4 pg — ABNORMAL LOW (ref 26.0–34.0)
MCH: 26.2 pg (ref 26.0–34.0)
MCH: 26.3 pg (ref 26.0–34.0)
MCHC: 30.7 g/dL (ref 30.0–36.0)
MCHC: 32.1 g/dL (ref 30.0–36.0)
MCHC: 32.1 g/dL (ref 30.0–36.0)
MCV: 79.4 fL (ref 78.0–100.0)
MCV: 81.7 fL (ref 78.0–100.0)
MCV: 81.9 fL (ref 78.0–100.0)
Platelets: 109 10*3/uL — ABNORMAL LOW (ref 150–400)
Platelets: 159 10*3/uL (ref 150–400)
Platelets: 170 10*3/uL (ref 150–400)
RBC: 2.38 MIL/uL — ABNORMAL LOW (ref 3.87–5.11)
RBC: 2.63 MIL/uL — ABNORMAL LOW (ref 3.87–5.11)
RBC: 2.59 MIL/uL — ABNORMAL LOW (ref 3.87–5.11)
RDW: 19.1 % — ABNORMAL HIGH (ref 11.5–15.5)
RDW: 18 % — ABNORMAL HIGH (ref 11.5–15.5)
RDW: 18.3 % — ABNORMAL HIGH (ref 11.5–15.5)
WBC: 6.5 10*3/uL (ref 4.0–10.5)
WBC: 7.2 10*3/uL (ref 4.0–10.5)
WBC: 9.7 10*3/uL (ref 4.0–10.5)

## 2015-07-06 LAB — BASIC METABOLIC PANEL
Anion gap: 6 (ref 5–15)
Anion gap: 4 — ABNORMAL LOW (ref 5–15)
BUN: 14 mg/dL (ref 6–20)
BUN: 12 mg/dL (ref 6–20)
CO2: 20 mmol/L — ABNORMAL LOW (ref 22–32)
CO2: 19 mmol/L — ABNORMAL LOW (ref 22–32)
Calcium: 7.8 mg/dL — ABNORMAL LOW (ref 8.9–10.3)
Calcium: 7.8 mg/dL — ABNORMAL LOW (ref 8.9–10.3)
Chloride: 113 mmol/L — ABNORMAL HIGH (ref 101–111)
Chloride: 115 mmol/L — ABNORMAL HIGH (ref 101–111)
Creatinine, Ser: 0.69 mg/dL (ref 0.44–1.00)
Creatinine, Ser: 0.76 mg/dL (ref 0.44–1.00)
GFR calc Af Amer: >60 mL/min (ref >60)
GFR calc Af Amer: >60 mL/min (ref >60)
GFR calc non Af Amer: >60 mL/min (ref >60)
GFR calc non Af Amer: >60 mL/min (ref >60)
Glucose, Bld: 141 mg/dL — ABNORMAL HIGH (ref 65–99)
Glucose, Bld: 242 mg/dL — ABNORMAL HIGH (ref 65–99)
Potassium: 3.1 mmol/L — ABNORMAL LOW (ref 3.5–5.1)
Potassium: 3.2 mmol/L — ABNORMAL LOW (ref 3.5–5.1)
Sodium: 139 mmol/L (ref 135–145)
Sodium: 138 mmol/L (ref 135–145)

## 2015-07-06 LAB — LACTIC ACID, PLASMA: Lactic Acid, Venous: 3.3 mmol/L (ref 0.5–2.0)

## 2015-07-06 LAB — PREPARE RBC (CROSSMATCH)

## 2015-07-06 MED ORDER — SODIUM CHLORIDE 0.9 % IV SOLN: Freq: Once | INTRAVENOUS | Status: AC

## 2015-07-06 MED ORDER — FLUTICASONE PROPIONATE 50 MCG/ACT NA SUSP
2.0000 | Freq: Every day | NASAL | Status: DC
  Administered 2015-07-06 – 2015-07-09 (×4): 2 via NASAL
  Filled 2015-07-06: qty 16

## 2015-07-06 MED ORDER — IOHEXOL 300 MG/ML  SOLN
100.0000 mL | Freq: Once | INTRAMUSCULAR | Status: AC | PRN
  Administered 2015-07-06: 100 mL via INTRAVENOUS

## 2015-07-06 MED ORDER — OXYCODONE HCL 5 MG PO TABS
5.0000 mg | ORAL_TABLET | ORAL | Status: DC | PRN
  Administered 2015-07-07 – 2015-07-09 (×3): 5 mg via ORAL
  Filled 2015-07-06 (×3): qty 1

## 2015-07-06 MED ORDER — SUCRALFATE 1 GM/10ML PO SUSP
1.0000 g | Freq: Three times a day (TID) | ORAL | Status: DC
  Administered 2015-07-06 – 2015-07-09 (×13): 1 g via ORAL
  Filled 2015-07-06 (×18): qty 10

## 2015-07-06 MED ORDER — POTASSIUM CHLORIDE CRYS ER 20 MEQ PO TBCR
40.0000 meq | EXTENDED_RELEASE_TABLET | Freq: Once | ORAL | Status: AC
  Administered 2015-07-06: 40 meq via ORAL
  Filled 2015-07-06: qty 2

## 2015-07-06 MED ORDER — SODIUM CHLORIDE 0.9 % IV SOLN
Freq: Once | INTRAVENOUS | Status: AC
  Administered 2015-07-06: 10 mL/h via INTRAVENOUS

## 2015-07-06 MED ORDER — PANTOPRAZOLE SODIUM 40 MG PO TBEC
40.0000 mg | DELAYED_RELEASE_TABLET | Freq: Two times a day (BID) | ORAL | Status: DC
  Administered 2015-07-06 – 2015-07-09 (×7): 40 mg via ORAL
  Filled 2015-07-06 (×7): qty 1

## 2015-07-06 MED ORDER — IOHEXOL 300 MG/ML  SOLN
25.0000 mL | INTRAMUSCULAR | Status: AC
  Administered 2015-07-06 (×2): 25 mL via ORAL

## 2015-07-06 NOTE — Progress Notes (Signed)
Late entry due to floor events  CTSP after she had 2 large bloody bowel movements.  Pt reported dizziness with ambulation, but denied headache, chest pain, SOB. Reports mild stable abdominal pain  BP 131/72 mmHg  Pulse 86  Temp(Src) 98 F (36.7 C) (Oral)  Resp 17  SpO2 98%  Exam Gen: Lying in bed, mild distress Pulm: CTAB CV: RRR Abd: soft, mildy tender to RLQ, no rebound or guarding  A/P 59 y/o admitted for rectal bleeding, now with continued rectal bleeding - asked nurse to place and maintain bilateral IV access - CBC obtained at that time which showed Hgb 5.8, ordered for 2 units pRBCs, will follow post transfusion Hct - continue IV protonix - continue to monitor closely - GI in the AM       Havard Radigan A. Lincoln Brigham MD, [facility] Resident PGY-2 Pager [phone]

## 2015-07-06 NOTE — Progress Notes (Signed)
Family Medicine Teaching Service Daily Progress Note Intern Pager: [phone]  [name] Medical record number: [id number] Date of birth: 1956/06/06 Age: 59 y.o. Gender: female  Primary Care Provider: [name], MD Consultants: GI Code Status: FULL  Pt Overview and Major Events to Date:  9/3: 2u pRBC transfusion  9/3-9/4: Bloody BM x2 with Hgb 5.8: 2u pRBC   Assessment and Plan: [name] is a 59 y.o. female presenting with GI bleed . PMH is significant for diverticular disease with previous GI bleed; history of diabetes, HLD and hypertension; status post gastric bypass surgery; h/o TIA.   GI bleed:  Likely due to diverticulosis, as it has been in the past; also considering ischemic colitis as presentation is associated with abdominal pain. No inciting factors such as NSAIDs or blood thinners noted. Recent colonoscopy with Dr. Deatra Ina in 11/2014 at which time she was noted to have moderate diverticulosis in the ascending colon,the cecum, in the transverse colon, sigmoid colon, and descending colon. EGD Aug 2016 demonstrated superficial linear ulceration at the anastomosis and benign fundic polyp. Barium swallow Aug 2016 showed no evidence of high grade stricture or mass, no mucosal lesions, and mild GERD. She has had 3 GI bleeds this year and was last hospitalized in July. She's status post gastric bypass done in July 2013. INR stable at 1.17. Hemoglobin down to 6.6 (baseline appears to be ~8) on admission.  S/p 2u pRBCs transfusion 9/3. Overnight bloody BM x2 with hgb 5.8, and was given 2 units pRBC.  - follow up post-transfusion hgb today - will order lactic acid to evaluate ischemic colitis - 2 large bore peripheral IVs - monitor VSS closely - GI consulted: follow up with recommendations today - NPO pending GI evaluation - PPI IV BID - tylenol 650mg  prn for abdominal pain  - would recommend starting daily fiber supplement after discharge   Hypokalemia:  -  repleat with Kdur 49mEq once today  - repeat BMP    HTN: 3 low BP readings overnight which resolved. Currently stable.  - Holding lisinopril for now as BP have not been elevated  - Continue to monitor BP   HLD  - continue home Lipitor   H/o DM: Last A1c 5.4 on 03/2015. Not on any medication.  - will monitor glucose on BMET  GERD - holding home Nexium while on IV Protonix BID  H/o TIA: Patient states there are no deficits from stoke and that she was unaware she had one until it was noted on imaging.   FEN/GI: NS @10  mL/hour to maintain IV access, NPO; Protonix  Prophylaxis: SCDs; No anticoagulation due to acute bleed  Disposition: SDU; Transfer to floor pending hemodynamic stability s/p transfusion; FPTS Attending [place]   Subjective:  Patient noted to have 2 bloody BMs overnight. Hgb overnight was. 5.8. Patient reported dizziness with ambulation then. Vitals were stable. Given 2 units pRBC early this morning.   This morning, patient doing fine. Has not had more BMs since overnight. Notes of lower abdominal pain which initially started two days before she had bloody BM. This abdominal pain resolved after she had initial bloody BM but returned yesterday. Patient states this pain is sharp and only with palpation of her abdomen; no radiation of pain. No fevers,chills, N/V, chest pain, sob. Her BM have been normal in consistency.   Objective: Temp:  [97.8 F (36.6 C)-98.4 F (36.9 C)] 98 F (36.7 C) (09/04 0639) Pulse Rate:  [80-97] 86 (09/04 0639) Resp:  [  0-30] 17 (09/04 0639) BP: (84-155)/(43-95) 131/72 mmHg (09/04 0639) SpO2:  [98 %-100 %] 98 % (09/04 0345) Physical Exam: General: NAD, lying in bed  Eyes: pallor of conjunctiva  Cardiovascular: RRR. 2/6 systolic murmur heard equally in intensity in left and right upper sternal border. 2+ distal pulses, <2sec cap refill.  Respiratory: CTAB bilaterally  Abdomen: soft, tender to palpation in epigastric region and lower  quadrants, no guarding, no rebound tenderness; no oragnomegaly, +BS Extremities: no swelling, 2+ distal pulses Neuro: alert, no gross deficits  Laboratory:  Recent Labs Lab 07/05/15 1306 07/06/15 0112  WBC 7.9 6.5  HGB 6.6* 5.8*  HCT 21.9* 18.9*  PLT 294 109*    Recent Labs Lab 07/05/15 1306 07/06/15 0112  NA 140 139  K 3.6 3.1*  CL 111 113*  CO2 24 20*  BUN 14 14  CREATININE 0.69 0.69  CALCIUM 8.6* 7.8*  PROT 5.8*  --   BILITOT 0.5  --   ALKPHOS 67  --   ALT 21  --   AST 23  --   GLUCOSE 161* 141*    Imaging/Diagnostic Tests:   Smiley Houseman, MD 07/06/2015, 6:45 AM PGY-1, [place] Intern pager: [phone], text pages welcome

## 2015-07-06 NOTE — Consult Note (Signed)
[facility] Consult: 8:08 AM 07/06/2015  LOS: 1 day    Referring Provider: Dr Gwendlyn Deutscher  Primary Care Physician:  [name], MD Primary Gastroenterologist:  Dr. Deatra Ina     Reason for Consultation:  Painless hematochezia   HPI: [name] is a 59 y.o. female.  s/p gastric bypass 2013. DM2, htn, TIA.    Recurrent diverticular bleeds (most recently 10/2014, 03/2015, 05/2015 but dating to at least 2001) and ABL anemia.  Last transfused 05/2015 with 2 PRBCs, po iron added then. ASA held for one week but restarted. Meckel's scan negative in 05/2000.  Mesenteric angiography negative 12/2007. Pt also has refractory GERD.  Since generic Nexium not helping, she is only taking Carafate at present, waiting on approval for insurance to pay for non-generic.  11/2014 Colonoscopy.  Moderate pan diverticulosis. 01/23/15 NM RBC scan: no active bleeding. (note tagged scan of 12/2007 + for splenic flexure bleeding) 06/2015 EGD. For heartburn despite generic Nexium and recent addition of Carafate per PMD. Superficial linear ulceration at anastomosis, fundic gland polyp. 06/30/15 Ba esophagram: Patulous esophagus with esophageal dysmotility (combo of presbyesophagus and bypass anatomy), no strictures, some retained secretions, mild reflux.  05/2013: Sigmoid colon polyp (inflammatory)  12/2007 transverse colon polyp, tubular adenoma.    Admitted with recurrent hematochezia.  On Thursday, she had onset of pain in lower abdomen/pelvis area but normal looking stools and no nausea.  The pain persisted but with onset of hematochexia at 7 AM on Saturday (yesterday), the pain subsided.  Had total of 5 bloody events, last was ~ 10 PM last night.  This AM the pain is back but not severe.   Received blood and fluids for BPs as low as 80s/40s, pulse  max 106.  Hgb 6.6, c/w 7.4 on 05/26/15.  S/p 4 PRBCs     Past Medical History  Diagnosis Date  . Anemia   . Mini stroke   . GERD (gastroesophageal reflux disease)   . Arthritis     knees  . Hypertension     under control with meds., has been on med. > 20 yr.  . High cholesterol   . Shoulder impingement 08/2013    right  . Rotator cuff rupture, complete 08/2013    right  . Degenerative arthritis of right shoulder region 08/2013  . Wears dentures     upper  . Wears partial dentures     lower  . History of GI bleed   . Back pain 10/04/2012  . Stroke 2006    Remote left lacunar infarct noted on CT head 2006, 2010   . Diverticulosis   . Blood transfusion without reported diagnosis     Past Surgical History  Procedure Laterality Date  . 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 Abdominal hysterectomy      partial  . Total knee arthroplasty Left   . Total knee arthroplasty Right   . Colonoscopy  05/02/2000; 05/08/2013  . Shoulder arthroscopy with rotator cuff repair and subacromial decompression Right 08/09/2013    Procedure: RIGHT SHOULDER ARTHROSCOPY WITH  SUBACROMIAL DECOMPRESSION, DISTAL CLAVICLE EXCISION AND ROTATOR CUFF REPAIR and release biceps;  Surgeon: Ninetta Lights, MD;  Location: [facility];  Service: Orthopedics;  Laterality: Right;  . Cosmetic surgery  02/22/2014    skin removal surgery from lower abdomen     Prior to Admission medications   Medication Sig Start Date End Date Taking? Authorizing Provider  atorvastatin (LIPITOR) 40 MG tablet Take 1 tablet (40 mg total) by mouth daily. 08/19/14  Yes Bernadene Bell, MD  calcium carbonate (OS-CAL) 600 MG TABS tablet Take 1 tablet (600 mg total) by mouth 2 (two) times daily with a meal. 01/07/14  Yes Josalyn Funches, MD  Cetirizine HCl (ZYRTEC ALLERGY) 10 MG CAPS Take 1 capsule (10 mg total) by mouth daily. Patient taking differently: Take 1 capsule by mouth at bedtime.  03/17/15  Yes [name], MD  docusate  sodium (COLACE) 100 MG capsule Take 1 capsule (100 mg total) by mouth 2 (two) times daily as needed. For constipation Patient taking differently: Take 100-200 mg by mouth 2 (two) times daily. For constipation, take 200 mg in the am and 100 mg in the evening 08/19/14  Yes Bernadene Bell, MD  ferrous sulfate 220 (44 FE) MG/5ML solution Take 268.4 mg by mouth 2 (two) times daily with a meal. 6.94ml twice daily   Yes Historical Provider, MD  fluticasone (FLONASE) 50 MCG/ACT nasal spray Place 2 sprays into both nostrils at bedtime. 01/07/14  Yes Josalyn Funches, MD  lisinopril (PRINIVIL,ZESTRIL) 20 MG tablet TAKE ONE TABLET BY MOUTH ONCE DAILY 06/20/15  Yes [name], MD  Multiple Vitamin (MULTIVITAMIN) tablet Take 1 tablet by mouth daily. 01/07/14  Yes Josalyn Funches, MD  polyethylene glycol powder (GLYCOLAX/MIRALAX) powder TAKE 17G BY MOUTH TWICE DAILY 11/07/14  Yes [name], MD  potassium chloride SA (K-DUR,KLOR-CON) 20 MEQ tablet Take two tablets for a one time dose. Patient taking differently: Take 20 mEq by mouth 2 (two) times daily.  03/18/15  Yes [name], MD  sucralfate (CARAFATE) 1 GM/10ML suspension Take 10 mLs (1 g total) by mouth 4 (four) times daily -  with meals and at bedtime. 05/26/15  Yes [name], MD  topiramate (TOPAMAX) 50 MG tablet Take 50 mg by mouth daily.  12/26/14 12/26/15 Yes Historical Provider, MD  Vitamins A & D 5000-400 UNITS CAPS Take 1 tablet by mouth daily. 02/18/15  Yes Historical Provider, MD  zolpidem (AMBIEN) 10 MG tablet TAKE ONE TABLET AT BEDTIME AS NEEDED FOR SLEEP 05/26/15  Yes [name], MD  esomeprazole (NEXIUM) 40 MG capsule Take 1 capsule (40 mg total) by mouth daily. Patient taking differently: Take 40 mg by mouth daily as needed (acid reflux, heartburn).  06/02/15   Amy S Esterwood, PA-C    Scheduled Meds: . sodium chloride   Intravenous Once  . atorvastatin  40 mg Oral q1800  . pantoprazole (PROTONIX) IV  40 mg Intravenous BID  . potassium  chloride  40 mEq Oral Once  . topiramate  50 mg Oral Daily   Infusions: . sodium chloride 10 mL/hr at 07/05/15 2237   PRN Meds: acetaminophen, ondansetron, zolpidem   Allergies as of 07/05/2015 - Review Complete 07/05/2015  Allergen Reaction Noted  . Aspirin Other (See Comments) 05/12/2007  . Nsaids Other (See Comments) 07/24/2009    Family History  Problem Relation Age of Onset  . Cancer Father 43    Died from complications of colon CA  .  Diabetes Mother   . Hypertension Mother   . Diabetes Sister   . Heart disease Sister   . Diabetes Sister   . Stroke Sister   . Diabetes Brother   . Stroke Brother   . Diabetes Brother   . Diabetes Brother   . Diabetes Brother   . Colon cancer Father     died in his 68s    Social History   Social History  . Marital Status: Divorced    Spouse Name: N/A  . Number of Children: 1  . 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 Smoking status: Never Smoker   . Smokeless tobacco: Never Used  . Alcohol Use: 0.0 oz/week    0 Standard drinks or equivalent per week     Comment: occasional wine  . Drug Use: No  . Sexual Activity: Not on file   Other Topics Concern  . Not on file   Social History Narrative    REVIEW OF SYSTEMS: Constitutional:  + weakness and dizziness yesterday.   ENT:  No nose bleeds Pulm:  + cough and a little bit of DOE CV:  + tachy palpitations yesterday. No chest pain.  In last week her edema in lower legs has been persistent, normally in improves overnight.   GU:  No hematuria, no frequency GI:  Per HPI Heme:  Per HPI   Transfusions:  Per HPI Neuro:  No headaches, no peripheral tingling or numbness.  No hx seizures MS:  Previously had occasional leg cramps, these have been more frequent in last week or so and occur day and night.  Derm:  No itching, no rash or sores.  Endocrine:  No sweats or chills.  No polyuria or dysuria Immunization:  Not  queried.  Travel:  None beyond local counties in last few months.    PHYSICAL EXAM: Vital signs in last 24 hours: Filed Vitals:   07/06/15 0639  BP: 131/72  Pulse: 86  Temp: 98 F (36.7 C)  Resp: 17   Wt Readings from Last 3 Encounters:  06/02/15 191 lb (86.637 kg)  05/29/15 191 lb 6 oz (86.807 kg)  05/26/15 193 lb (87.544 kg)    General: pleasant, comfortable.  Not ill looking Head:  No swelling or asymmetry.   Eyes:  No pallor or icterus Ears:  Not HOH  Nose:  No congestion or discharge Mouth:  Moist, clear, dentures in place Neck:  No JVD or TMG Lungs:  Clear bil.  No cough or dyspnea Heart: RRR.  No mrg.  S1/S2 audible Abdomen:  Soft, active BS, ND.  Slight tenderness but no GR in lower abdomen, worse on right .   Rectal: deferred   Musc/Skeltl: no joint contracture or swelling Extremities:  No CCE  Neurologic:  Oriented x3.  No tremor or limb weakness.   Skin:  No rash or sores Tattoos:  none Nodes:  No cervical adenopthy   Psych:  Pleasant, calm.  Cooperative.   Intake/Output from previous day: 09/03 0701 - 09/04 0700 In: 730 [Blood:730] Out: 1400 [Urine:350; Stool:1050] Intake/Output this shift:    LAB RESULTS:  Recent Labs  07/05/15 1306 07/06/15 0112  WBC 7.9 6.5  HGB 6.6* 5.8*  HCT 21.9* 18.9*  PLT 294 109*   BMET Lab Results  Component Value Date   NA 139 07/06/2015   NA 140 07/05/2015   NA 145 05/17/2015   K 3.1*  07/06/2015   K 3.6 07/05/2015   K 3.7 05/17/2015   CL 113* 07/06/2015   CL 111 07/05/2015   CL 109 05/17/2015   CO2 20* 07/06/2015   CO2 24 07/05/2015   CO2 21* 05/12/2015   GLUCOSE 141* 07/06/2015   GLUCOSE 161* 07/05/2015   GLUCOSE 89 05/17/2015   BUN 14 07/06/2015   BUN 14 07/05/2015   BUN 15 05/17/2015   CREATININE 0.69 07/06/2015   CREATININE 0.69 07/05/2015   CREATININE 0.70 05/17/2015   CALCIUM 7.8* 07/06/2015   CALCIUM 8.6* 07/05/2015   CALCIUM 8.0* 05/12/2015   LFT  Recent Labs  07/05/15 1306  PROT  5.8*  ALBUMIN 3.1*  AST 23  ALT 21  ALKPHOS 67  BILITOT 0.5   PT/INR Lab Results  Component Value Date   INR 1.17 07/05/2015   INR 1.18 05/12/2015   INR 1.20 01/23/2015    RADIOLOGY STUDIES: No results found.  ENDOSCOPIC STUDIES: Per HPI  IMPRESSION:    *  Recurrent LGI bleed, likely diverticular.  Hx of same on multiple occasions dating back to 2001.  This episode associated with lower abdominal pain and tenderness, raising some suspicion for ischemic colitis.    *  ABL anemia.  Recurrent.  On po iron at home. S/p 4 PRBCs.   *  Acute thrombocytopenia.   *  GERD, esophageal dysmotility, anastomotic ulcerations on EGD and esophagram in 06/2015.  On po Carafate at home. Waiting on approval for non-generic Nexium the only PPI that really helps her.    *  S/p bariatric gastric bypass 2003  *  Hypokalemia.     PLAN:     *  Allow solid food, no plans to repeat the colonoscopy. Bleeding has stopped for now so RBC scan will not be useful.    *  ? Why is she taking Topamax?:  She denies hx seizures and headaches.  Eliminating this med, with innumerable side effects and potential for adverse effects, may be useful in controlling her refractory GERD.   *  If abdominal pain persists, consider CT scan.   *  Total colectomy may be a consideration at this point.       Azucena Freed  07/06/2015, 8:08 AM Pager: [phone]

## 2015-07-06 NOTE — Progress Notes (Signed)
Pt has had 2 frank bloody stools since drinking contrast. CBC pending.   [name]

## 2015-07-07 ENCOUNTER — Inpatient Hospital Stay (HOSPITAL_COMMUNITY): Payer: Commercial Managed Care - HMO

## 2015-07-07 DIAGNOSIS — R1084 Generalized abdominal pain: Secondary | ICD-10-CM

## 2015-07-07 LAB — CBC
HCT: 21.6 % — ABNORMAL LOW (ref 36.0–46.0)
HCT: 22.2 % — ABNORMAL LOW (ref 36.0–46.0)
HCT: 24.6 % — ABNORMAL LOW (ref 36.0–46.0)
Hemoglobin: 7 g/dL — ABNORMAL LOW (ref 12.0–15.0)
Hemoglobin: 7.3 g/dL — ABNORMAL LOW (ref 12.0–15.0)
Hemoglobin: 8.2 g/dL — ABNORMAL LOW (ref 12.0–15.0)
MCH: 26.1 pg (ref 26.0–34.0)
MCH: 26.6 pg (ref 26.0–34.0)
MCH: 27.1 pg (ref 26.0–34.0)
MCHC: 32.4 g/dL (ref 30.0–36.0)
MCHC: 32.9 g/dL (ref 30.0–36.0)
MCHC: 33.3 g/dL (ref 30.0–36.0)
MCV: 80.6 fL (ref 78.0–100.0)
MCV: 81 fL (ref 78.0–100.0)
MCV: 81.2 fL (ref 78.0–100.0)
Platelets: 143 10*3/uL — ABNORMAL LOW (ref 150–400)
Platelets: 122 10*3/uL — ABNORMAL LOW (ref 150–400)
Platelets: DECREASED 10*3/uL (ref 150–400)
RBC: 2.68 MIL/uL — ABNORMAL LOW (ref 3.87–5.11)
RBC: 2.74 MIL/uL — ABNORMAL LOW (ref 3.87–5.11)
RBC: 3.03 MIL/uL — ABNORMAL LOW (ref 3.87–5.11)
RDW: 17 % — ABNORMAL HIGH (ref 11.5–15.5)
RDW: 17.6 % — ABNORMAL HIGH (ref 11.5–15.5)
RDW: 17.6 % — ABNORMAL HIGH (ref 11.5–15.5)
WBC: 8.5 10*3/uL (ref 4.0–10.5)
WBC: 8.7 10*3/uL (ref 4.0–10.5)
WBC: 12.1 10*3/uL — ABNORMAL HIGH (ref 4.0–10.5)

## 2015-07-07 LAB — BASIC METABOLIC PANEL
Anion gap: 3 — ABNORMAL LOW (ref 5–15)
BUN: 13 mg/dL (ref 6–20)
CO2: 20 mmol/L — ABNORMAL LOW (ref 22–32)
Calcium: 7.5 mg/dL — ABNORMAL LOW (ref 8.9–10.3)
Chloride: 115 mmol/L — ABNORMAL HIGH (ref 101–111)
Creatinine, Ser: 0.55 mg/dL (ref 0.44–1.00)
GFR calc Af Amer: >60 mL/min (ref >60)
GFR calc non Af Amer: >60 mL/min (ref >60)
Glucose, Bld: 119 mg/dL — ABNORMAL HIGH (ref 65–99)
Potassium: 3.8 mmol/L (ref 3.5–5.1)
Sodium: 138 mmol/L (ref 135–145)

## 2015-07-07 MED ORDER — TECHNETIUM TC 99M-LABELED RED BLOOD CELLS IV KIT
24.9900 | PACK | Freq: Once | INTRAVENOUS | Status: AC | PRN
  Administered 2015-07-07: 24.99 via INTRAVENOUS

## 2015-07-07 NOTE — Progress Notes (Signed)
Daily Rounding Note  07/07/2015, 11:41 AM  LOS: 2 days   SUBJECTIVE:       Acute, large volume hematochezia and intense lower abd pain last night, hypotension to low 100s/ 40s, no tachycardia.  Went for RBC scan starting 0830 - 1130.  Scan not yet read.   Received additional 2 units PRBCs last night (6 total thus far).  No bleeding since last night.    OBJECTIVE:         Vital signs in last 24 hours:    Temp:  [97.7 F (36.5 C)-98.5 F (36.9 C)] 97.9 F (36.6 C) (09/05 0504) Pulse Rate:  [80-91] 87 (09/05 0504) Resp:  [16-19] 18 (09/05 0504) BP: (89-151)/(45-74) 114/68 mmHg (09/05 0504) SpO2:  [98 %-100 %] 100 % (09/05 0504) Weight:  [184 lb 11.9 oz (83.8 kg)] 184 lb 11.9 oz (83.8 kg) (09/05 0753) Last BM Date: 07/06/15 Filed Weights   07/07/15 0753  Weight: 184 lb 11.9 oz (83.8 kg)   General: pleasant as always, not ill looking   Heart: RRR Chest: clear bil.  No dyspnea Abdomen: soft, + BS, tender right lower quadrant  Extremities: no CCE Neuro/Psych:  Oriented x 3, alert, calm, in good spirits.   Intake/Output from previous day: 09/04 0701 - 09/05 0700 In: 2114 [P.O.:1200; Blood:914] Out: 800 [Urine:800]  Intake/Output this shift: Total I/O In: -  Out: 300 [Urine:300]  Lab Results:  Recent Labs  07/06/15 1833 07/07/15 0256 07/07/15 0743  WBC 9.7 8.5 8.7  HGB 6.8* 7.0* 7.3*  HCT 21.2* 21.6* 22.2*  PLT 170 143* 122*   BMET  Recent Labs  07/06/15 0112 07/06/15 1134 07/07/15 0256  NA 139 138 138  K 3.1* 3.2* 3.8  CL 113* 115* 115*  CO2 20* 19* 20*  GLUCOSE 141* 242* 119*  BUN 14 12 13   CREATININE 0.69 0.76 0.55  CALCIUM 7.8* 7.8* 7.5*   LFT  Recent Labs  07/05/15 1306  PROT 5.8*  ALBUMIN 3.1*  AST 23  ALT 21  ALKPHOS 67  BILITOT 0.5   PT/INR  Recent Labs  07/05/15 1340  LABPROT 15.1  INR 1.17   Hepatitis Panel No results for input(s): HEPBSAG, HCVAB, HEPAIGM, HEPBIGM  in the last 72 hours.  Studies/Results: Ct Abdomen Pelvis W Contrast  07/06/2015   CLINICAL DATA:  59 year old female with bloody stools and abdominal pain. Personal history of GI bleed. Subsequent encounter.  EXAM: CT ABDOMEN AND PELVIS WITH CONTRAST  TECHNIQUE: Multidetector CT imaging of the abdomen and pelvis was performed using the standard protocol following bolus administration of intravenous contrast.  CONTRAST:  13mL OMNIPAQUE IOHEXOL 300 MG/ML  SOLN  COMPARISON:  GI bleeding study 01/23/2015. CT Abdomen and Pelvis 09/24/2012.  FINDINGS: Mild respiratory motion artifact at the lung bases which appear stable. No pericardial or pleural effusion.  No acute osseous abnormality identified. Lumbar facet degeneration. Degenerative changes at the pubic symphysis.  Diminutive bladder. Gaseous distension of the rectum. The uterus and adnexa appear to be surgically absent as before.  Redundant sigmoid colon with extensive diverticulosis. Gas and stool mildly distending the sigmoid. Extensive diverticulosis of the left colon and at the splenic flexure. Diverticulosis continues throughout the transverse colon. Moderate diverticulosis of the right colon. No large bowel wall thickening or mesenteric stranding identified. Postoperative changes which appear related to gastrojejunostomy in the upper abdomen. Fluid in contrast filled small bowel loops. Oral contrast has not reached the terminal ileum. No  dilated small bowel. No definite inflamed bowel loops. The appendix appears to be normal on series 3, image 54.  Stable left lobe hepatic cysts. The gallbladder appears within normal limits. No abdominal free fluid identified. Spleen, pancreas and adrenal glands are within normal limits. The portal venous system and major arterial structures are patent. Calcified iliac artery atherosclerosis. No lymphadenopathy identified. Small bilateral low-density renal areas are too small to characterize but most likely benign cysts.  Renal contrast excretion appears symmetric.  IMPRESSION: No focal inflammatory process identified in the abdomen or pelvis. There is severe and widespread colonic diverticulosis. Consider diverticular bleeding in this setting.   Electronically Signed   By: Genevie Ann M.D.   On: 07/06/2015 18:05   Scheduled Meds: . atorvastatin  40 mg Oral q1800  . fluticasone  2 spray Each Nare Daily  . pantoprazole  40 mg Oral BID  . sucralfate  1 g Oral TID WC & HS   Continuous Infusions: . sodium chloride 10 mL/hr at 07/07/15 0608   PRN Meds:.acetaminophen, ondansetron, oxyCODONE, zolpidem  ASSESMENT:   * Recurrent LGI bleed, likely diverticular. Hx of same on multiple occasions dating back to 2001. This episode associated with lower abdominal pain and tenderness.  However CT scan is negative for colitis or diverticulitis.  Nuc RBC bleeding scan pending.   * ABL anemia. Recurrent. On po iron at home. S/p 6 PRBCs. Iron studies with IDA.  On BID oral iron at home.    * Acute thrombocytopenia. Improved.   * GERD, esophageal dysmotility, anastomotic ulcerations on EGD and esophagram in 06/2015. On po Carafate at home. Waiting on approval for non-generic Nexium, the only PPI that really helps her. For inpt stay on BID Protonix and the Carafate. .   * S/p bariatric gastric bypass 2003   PLAN   *  Await reading of bleeding scan  *  Outpt surgical referral re: subtotal colectomy.   *  Wonder about dosing with parenteral iron, as IDA present despite oral supplements?    [name]  07/07/2015, 11:41 AM Pager: [phone]

## 2015-07-07 NOTE — Progress Notes (Signed)
No rectal bleeding noted the whole shift. Endorsed to incoming shift that if ever she has another rectal bleeding , to call  Gastro. MD  and to notify Nuclear med.

## 2015-07-07 NOTE — Progress Notes (Signed)
Family Medicine Teaching Service Daily Progress Note Intern Pager: [phone]  [name] Medical record number: [id number] Date of birth: 09-Jun-1956 Age: 59 y.o. Gender: female  Primary Care Provider: [name], MD Consultants: GI Code Status: FULL  Pt Overview and Major Events to Date:  9/3: 2u pRBC transfusion  9/3-9/4: Bloody BM x2 with Hgb 5.8: 2u pRBC with post transfusion  hgb at 6.9 9/4: GI: no colonoscopy; patient agreeable to discuss with surgeon for elective subtotal colectomy with ileorectal anastomosis on outpatient basis.  9/4: 4 bloody BM around 3pm, with BP 71/40, lightheaded while walking; hgb 6.8: 1L NS bolus and 2 units RBC; post-transfusion hgb 7 with AM hgb 7.3 9/4: noted of abdominal pain: lactic acid 3.3. CT abdomen w/ contrast: No focal inflammatory process identified in the abdomen or pelvis.There is severe and  widespread colonic diverticulosis 9/5: RBC scan: No detectable gastrointestinal tract bleeding over the 2 hr observation.  9/5: discontinued home Topamax: has bleeding as SE  Assessment and Plan: Alara MAKILAH DOWDA is a 59 y.o. female presenting with GI bleed . PMH is significant for diverticular disease with previous GI bleed; history of diabetes, HLD and hypertension; status post gastric bypass surgery; h/o TIA.   GI bleed: Likely due to diverticulosis, as it has been in the past; also considering ischemic colitis as presentation is associated with abdominal pain. No inciting factors such as NSAIDs or blood thinners noted. Recent colonoscopy with Dr. Deatra Ina in 11/2014 at which time she was noted to have moderate diverticulosis in the ascending colon,the cecum, in the transverse colon, sigmoid colon, and descending colon. EGD Aug 2016 demonstrated superficial linear ulceration at the anastomosis and benign fundic polyp. Barium swallow Aug 2016 showed no evidence of high grade stricture or mass, no mucosal lesions, and mild GERD. She has had 3  GI bleeds this year and was last hospitalized in July. She's status post gastric bypass done in July 2013. INR stable at 1.17. S/p total of 6 units of pRBC this admission. CT abdomen negative for ischemic colitis. AM hgb 7.3 today. RBC scan 9/5: No detectable gastrointestinal tract bleeding over the 2 hr observation  - repeat CBC at 2pm as hgb did not rise as expected with 2 units yesterday  - monitor VSS closely - GI consulted appreciate reccs: RBC scan negative. If active bleeding resumes in the next 24-48 hours would send a nuclear medicine and repeat the tagged red blood cell scan; recommend outpatient surgical referral regarding subtotal colectomy - PPI IV BID - tylenol 650mg  prn for abdominal pain - will discontinue home Topamax due to side effects of possible bleeding  - would recommend starting daily fiber supplement after discharge   Hypokalemia: repleated w with Kdur 25mEq 9/4 - resolved today  Hypocalcemia: 7.5 today.   HTN: Low BP improved from yesterday. Currently normal BPs therefore will hold home meds - Holding lisinopril for now as BP have not been elevated  - Continue to monitor BP   HLD  - continue home Lipitor   H/o DM: Last A1c 5.4 on 03/2015. Not on any medication.  - will monitor glucose on BMET  GERD - holding home Nexium while on IV Protonix BID  H/o TIA: Patient states there are no deficits from stoke and that she was unaware she had one until it was noted on imaging.   FEN/GI: NS @10  mL/hour to maintain IV access, regular diet per GI; Protonix  Prophylaxis: SCDs; No anticoagulation due to acute  bleed  Disposition:  - pending stable hemoglobin   Subjective:  Patient states she is feeling fine this morning. Denies lightheadedness/dizziness. Has not had BM since last night. Patient states that her surgeon who did her bypass surgery prescribed Topamax; she is unsure why she is on this medication but states it may be prevention of gall stone formation.    Objective: Temp:  [97.7 F (36.5 C)-98.5 F (36.9 C)] 97.9 F (36.6 C) (09/05 0504) Pulse Rate:  [80-91] 87 (09/05 0504) Resp:  [15-19] 18 (09/05 0504) BP: (89-151)/(45-74) 114/68 mmHg (09/05 0504) SpO2:  [98 %-100 %] 100 % (09/05 0504) Physical Exam: General: NAD, lying in bed  Eyes: pallor of conjunctiva  Cardiovascular: RRR. 2/6 systolic murmur heard best in left  upper sternal border. 2+ distal pulses, <2sec cap refill.  Respiratory: CTAB bilaterally  Abdomen: soft, non-tender, no guarding, no rebound tenderness; no oragnomegaly, +BS Extremities: no swelling, 2+ distal pulses Neuro: alert, no gross deficits  Laboratory:  Recent Labs Lab 07/06/15 1134 07/06/15 1833 07/07/15 0256  WBC 7.2 9.7 8.5  HGB 6.9* 6.8* 7.0*  HCT 21.5* 21.2* 21.6*  PLT 159 170 143*    Recent Labs Lab 07/05/15 1306 07/06/15 0112 07/06/15 1134 07/07/15 0256  NA 140 139 138 138  K 3.6 3.1* 3.2* 3.8  CL 111 113* 115* 115*  CO2 24 20* 19* 20*  BUN 14 14 12 13   CREATININE 0.69 0.69 0.76 0.55  CALCIUM 8.6* 7.8* 7.8* 7.5*  PROT 5.8*  --   --   --   BILITOT 0.5  --   --   --   ALKPHOS 67  --   --   --   ALT 21  --   --   --   AST 23  --   --   --   GLUCOSE 161* 141* 242* 119*   Imaging/Diagnostic Tests:   Smiley Houseman, MD 07/07/2015, 6:58 AM PGY-1, [place] Intern pager: [phone], text pages welcome

## 2015-07-08 ENCOUNTER — Telehealth: Payer: Self-pay | Admitting: *Deleted

## 2015-07-08 ENCOUNTER — Encounter (HOSPITAL_COMMUNITY): Payer: Self-pay | Admitting: General Surgery

## 2015-07-08 DIAGNOSIS — K922 Gastrointestinal hemorrhage, unspecified: Secondary | ICD-10-CM

## 2015-07-08 DIAGNOSIS — D649 Anemia, unspecified: Secondary | ICD-10-CM

## 2015-07-08 DIAGNOSIS — K5731 Diverticulosis of large intestine without perforation or abscess with bleeding: Secondary | ICD-10-CM

## 2015-07-08 DIAGNOSIS — K5791 Diverticulosis of intestine, part unspecified, without perforation or abscess with bleeding: Principal | ICD-10-CM

## 2015-07-08 DIAGNOSIS — K5733 Diverticulitis of large intestine without perforation or abscess with bleeding: Secondary | ICD-10-CM

## 2015-07-08 LAB — BASIC METABOLIC PANEL
Anion gap: 5 (ref 5–15)
BUN: 9 mg/dL (ref 6–20)
CO2: 23 mmol/L (ref 22–32)
Calcium: 7.9 mg/dL — ABNORMAL LOW (ref 8.9–10.3)
Chloride: 113 mmol/L — ABNORMAL HIGH (ref 101–111)
Creatinine, Ser: 0.54 mg/dL (ref 0.44–1.00)
GFR calc Af Amer: >60 mL/min (ref >60)
GFR calc non Af Amer: >60 mL/min (ref >60)
Glucose, Bld: 95 mg/dL (ref 65–99)
Potassium: 3.6 mmol/L (ref 3.5–5.1)
Sodium: 141 mmol/L (ref 135–145)

## 2015-07-08 LAB — HEMOGLOBIN AND HEMATOCRIT, BLOOD
HCT: 28.1 % — ABNORMAL LOW (ref 36.0–46.0)
Hemoglobin: 9.2 g/dL — ABNORMAL LOW (ref 12.0–15.0)

## 2015-07-08 LAB — CBC
HCT: 20 % — ABNORMAL LOW (ref 36.0–46.0)
Hemoglobin: 6.6 g/dL — CL (ref 12.0–15.0)
MCH: 26.8 pg (ref 26.0–34.0)
MCHC: 33 g/dL (ref 30.0–36.0)
MCV: 81.3 fL (ref 78.0–100.0)
Platelets: 136 10*3/uL — ABNORMAL LOW (ref 150–400)
RBC: 2.46 MIL/uL — ABNORMAL LOW (ref 3.87–5.11)
RDW: 17.7 % — ABNORMAL HIGH (ref 11.5–15.5)
WBC: 9.5 10*3/uL (ref 4.0–10.5)

## 2015-07-08 LAB — PREPARE RBC (CROSSMATCH)

## 2015-07-08 LAB — PROTIME-INR
INR: 1.11 (ref 0.00–1.49)
Prothrombin Time: 14.5 seconds (ref 11.6–15.2)

## 2015-07-08 MED ORDER — SODIUM CHLORIDE 0.9 % IV SOLN
Freq: Once | INTRAVENOUS | Status: AC
  Administered 2015-07-08: 05:00:00 via INTRAVENOUS

## 2015-07-08 NOTE — Telephone Encounter (Signed)
A referral to surgery has been placed in EP[facility].  Note to Tia - please set up referral, thanks

## 2015-07-08 NOTE — Consult Note (Signed)
[name] 1956/01/10  [id number].   Requesting MD: Dr. Owens Loffler Chief Complaint/Reason for Consult: GI bleed HPI: This is a 59 yo black female who has a h/o multiple GI bleeds starting in 2001.  She has had multiple colonoscopies, EGDs, etc.  She has been felt to be having LGI bleeds secondary to pandiverticulosis.  She was supposed to be set up apparently with Dr. Marcello Moores in our office to discuss an elective subtotal abdominal colectomy.  On Saturday she began having BRBPR again.  After 2 episodes, she began getting light headed and dizzy.  She presented to [facility] where she was admitted.  She received pRBCs.  Her last bloody BM was Sunday night and she has not bled anymore since then.  Her hgb stabilized until this morning when it drifted down to 6.6.  She has not had any other bloody BMs though.  She was transfused 2 more units and it now up to 9.2.  She is tolerating a regular diet.  The patient requested if she could see someone with the surgical team to discuss further options.  Therefore, we have been asked to see her for further recommendations.  ROS : Please see HPI, otherwise all other systems are currently negative.  Lost 120lbs with gastric bypass and had abdominoplasty as well (Dr. Toney Rakes at [facility])  Family History  Problem Relation Age of Onset  . Cancer Father 47    Died from complications of colon CA  . Diabetes Mother   . Hypertension Mother   . Diabetes Sister   . Heart disease Sister   . Diabetes Sister   . Stroke Sister   . Diabetes Brother   . Stroke Brother   . Diabetes Brother   . Diabetes Brother   . Diabetes Brother   . Colon cancer Father     died in his 79s    Past Medical History  Diagnosis Date  . Anemia   . Mini stroke   . GERD (gastroesophageal reflux disease)   . Arthritis     knees  . Hypertension     under control with meds., has been on med. > 20 yr.  . High cholesterol   . Shoulder impingement 08/2013    right  . 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 Degenerative arthritis of right shoulder region 08/2013  . Wears dentures     upper  . Wears partial dentures     lower  . History of GI bleed   . Back pain 10/04/2012  . Stroke 2006    Remote left lacunar infarct noted on CT head 2006, 2010   . Diverticulosis   . Blood transfusion without reported diagnosis     Past Surgical History  Procedure Laterality Date  . 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 Abdominal hysterectomy      partial  . Total knee arthroplasty Left   . Total knee arthroplasty Right   . Colonoscopy  05/02/2000; 05/08/2013  . Shoulder arthroscopy with rotator cuff repair and subacromial decompression Right 08/09/2013    Procedure: RIGHT SHOULDER ARTHROSCOPY WITH SUBACROMIAL DECOMPRESSION, DISTAL CLAVICLE EXCISION AND ROTATOR CUFF REPAIR and release biceps;  Surgeon: Ninetta Lights, MD;  Location: [place];  Service: Orthopedics;  Laterality: Right;  . Cosmetic surgery  02/22/2014    skin removal surgery from lower abdomen     Social History:  reports that she has never smoked. She has never used smokeless tobacco. She  reports that she drinks alcohol. She reports that she does not use illicit drugs.  Allergies:  Allergies  Allergen Reactions  . Aspirin Other (See Comments)    GI BLEED  . Nsaids Other (See Comments)    GI BLEED    Medications Prior to Admission  Medication Sig Dispense Refill  . atorvastatin (LIPITOR) 40 MG tablet Take 1 tablet (40 mg total) by mouth daily. 90 tablet 3  . calcium carbonate (OS-CAL) 600 MG TABS tablet Take 1 tablet (600 mg total) by mouth 2 (two) times daily with a meal. 180 tablet 1  . Cetirizine HCl (ZYRTEC ALLERGY) 10 MG CAPS Take 1 capsule (10 mg total) by mouth daily. (Patient taking differently: Take 1 capsule by mouth at bedtime. ) 30 capsule 2  . docusate sodium (COLACE) 100 MG capsule Take 1 capsule (100 mg total) by mouth 2 (two) times daily as needed. For  constipation (Patient taking differently: Take 100-200 mg by mouth 2 (two) times daily. For constipation, take 200 mg in the am and 100 mg in the evening) 180 capsule 1  . ferrous sulfate 220 (44 FE) MG/5ML solution Take 268.4 mg by mouth 2 (two) times daily with a meal. 6.64m twice daily    . fluticasone (FLONASE) 50 MCG/ACT nasal spray Place 2 sprays into both nostrils at bedtime. 16 g 6  . lisinopril (PRINIVIL,ZESTRIL) 20 MG tablet TAKE ONE TABLET BY MOUTH ONCE DAILY 30 tablet 2  . Multiple Vitamin (MULTIVITAMIN) tablet Take 1 tablet by mouth daily. 90 tablet 1  . polyethylene glycol powder (GLYCOLAX/MIRALAX) powder TAKE 17G BY MOUTH TWICE DAILY 850 g 1  . potassium chloride SA (K-DUR,KLOR-CON) 20 MEQ tablet Take two tablets for a one time dose. (Patient taking differently: Take 20 mEq by mouth 2 (two) times daily. ) 2 tablet 0  . sucralfate (CARAFATE) 1 GM/10ML suspension Take 10 mLs (1 g total) by mouth 4 (four) times daily -  with meals and at bedtime. 420 mL 1  . topiramate (TOPAMAX) 50 MG tablet Take 50 mg by mouth daily.     . Vitamins A & D 5000-400 UNITS CAPS Take 1 tablet by mouth daily.    .Marland Kitchenzolpidem (AMBIEN) 10 MG tablet TAKE ONE TABLET AT BEDTIME AS NEEDED FOR SLEEP 30 tablet 1  . esomeprazole (NEXIUM) 40 MG capsule Take 1 capsule (40 mg total) by mouth daily. (Patient taking differently: Take 40 mg by mouth daily as needed (acid reflux, heartburn). ) 30 capsule 3    Blood pressure 131/87, pulse 84, temperature 98 F (36.7 C), temperature source Oral, resp. rate 16, height _0  (1.626 m), weight 83.8 kg (184 lb 11.9 oz), SpO2 100 %. Physical Exam: General: pleasant, WD, WN black female who is laying in bed in NAD HEENT: head is normocephalic, atraumatic.  Sclera are noninjected.  PERRL.  Ears and nose without any masses or lesions.  Mouth is pink and moist Heart: regular, rate, and rhythm.  Normal s1,s2. No obvious murmurs, gallops, or rubs noted.  Palpable radial and pedal pulses  bilaterally Lungs: CTAB, no wheezes, rhonchi, or rales noted.  Respiratory effort nonlabored Abd: soft, minimally tender around umbilicus, ND, +BS, no masses, hernias, or organomegaly, scars noted from abdominoplasty MS: all 4 extremities are symmetrical with no cyanosis, clubbing, or edema. Skin: warm and dry with no masses, lesions, or rashes Psych: A&Ox3 with an appropriate affect.    Results for orders placed or performed during the hospital encounter of 07/05/15 (from the  past 48 hour(s))  CBC     Status: Abnormal   Collection Time: 07/06/15  6:33 PM  Result Value Ref Range   WBC 9.7 4.0 - 10.5 K/uL   RBC 2.59 (L) 3.87 - 5.11 MIL/uL   Hemoglobin 6.8 (LL) 12.0 - 15.0 g/dL    Comment: REPEATED TO VERIFY CRITICAL VALUE NOTED.  VALUE IS CONSISTENT WITH PREVIOUSLY REPORTED AND CALLED VALUE.    HCT 21.2 (L) 36.0 - 46.0 %   MCV 81.9 78.0 - 100.0 fL   MCH 26.3 26.0 - 34.0 pg   MCHC 32.1 30.0 - 36.0 g/dL   RDW 18.3 (H) 11.5 - 15.5 %   Platelets 170 150 - 400 K/uL  Prepare RBC     Status: None   Collection Time: 07/06/15  8:24 PM  Result Value Ref Range   Order Confirmation ORDER PROCESSED BY BLOOD BANK   CBC     Status: Abnormal   Collection Time: 07/07/15  2:56 AM  Result Value Ref Range   WBC 8.5 4.0 - 10.5 K/uL   RBC 2.68 (L) 3.87 - 5.11 MIL/uL   Hemoglobin 7.0 (L) 12.0 - 15.0 g/dL   HCT 21.6 (L) 36.0 - 46.0 %   MCV 80.6 78.0 - 100.0 fL   MCH 26.1 26.0 - 34.0 pg   MCHC 32.4 30.0 - 36.0 g/dL   RDW 17.0 (H) 11.5 - 15.5 %   Platelets 143 (L) 150 - 400 K/uL  Basic metabolic panel     Status: Abnormal   Collection Time: 07/07/15  2:56 AM  Result Value Ref Range   Sodium 138 135 - 145 mmol/L   Potassium 3.8 3.5 - 5.1 mmol/L   Chloride 115 (H) 101 - 111 mmol/L   CO2 20 (L) 22 - 32 mmol/L   Glucose, Bld 119 (H) 65 - 99 mg/dL   BUN 13 6 - 20 mg/dL   Creatinine, Ser 0.55 0.44 - 1.00 mg/dL   Calcium 7.5 (L) 8.9 - 10.3 mg/dL   GFR calc non Af Amer >60 >60 mL/min   GFR calc Af  Amer >60 >60 mL/min    Comment: (NOTE) The eGFR has been calculated using the CKD EPI equation. This calculation has not been validated in all clinical situations. eGFR's persistently <60 mL/min signify possible Chronic Kidney Disease.    Anion gap 3 (L) 5 - 15  CBC     Status: Abnormal   Collection Time: 07/07/15  7:43 AM  Result Value Ref Range   WBC 8.7 4.0 - 10.5 K/uL   RBC 2.74 (L) 3.87 - 5.11 MIL/uL   Hemoglobin 7.3 (L) 12.0 - 15.0 g/dL   HCT 22.2 (L) 36.0 - 46.0 %   MCV 81.0 78.0 - 100.0 fL   MCH 26.6 26.0 - 34.0 pg   MCHC 32.9 30.0 - 36.0 g/dL   RDW 17.6 (H) 11.5 - 15.5 %   Platelets 122 (L) 150 - 400 K/uL  CBC     Status: Abnormal   Collection Time: 07/07/15  3:28 PM  Result Value Ref Range   WBC 12.1 (H) 4.0 - 10.5 K/uL   RBC 3.03 (L) 3.87 - 5.11 MIL/uL   Hemoglobin 8.2 (L) 12.0 - 15.0 g/dL   HCT 24.6 (L) 36.0 - 46.0 %   MCV 81.2 78.0 - 100.0 fL   MCH 27.1 26.0 - 34.0 pg   MCHC 33.3 30.0 - 36.0 g/dL   RDW 17.6 (H) 11.5 - 15.5 %   Platelets  150 - 400 K/uL    PLATELET CLUMPS NOTED ON SMEAR, COUNT APPEARS DECREASED    Comment: FIBRIN STRANDS NOTED  Basic metabolic panel     Status: Abnormal   Collection Time: 07/08/15  2:24 AM  Result Value Ref Range   Sodium 141 135 - 145 mmol/L   Potassium 3.6 3.5 - 5.1 mmol/L   Chloride 113 (H) 101 - 111 mmol/L   CO2 23 22 - 32 mmol/L   Glucose, Bld 95 65 - 99 mg/dL   BUN 9 6 - 20 mg/dL   Creatinine, Ser 0.54 0.44 - 1.00 mg/dL   Calcium 7.9 (L) 8.9 - 10.3 mg/dL   GFR calc non Af Amer >60 >60 mL/min   GFR calc Af Amer >60 >60 mL/min    Comment: (NOTE) The eGFR has been calculated using the CKD EPI equation. This calculation has not been validated in all clinical situations. eGFR's persistently <60 mL/min signify possible Chronic Kidney Disease.    Anion gap 5 5 - 15  CBC     Status: Abnormal   Collection Time: 07/08/15  2:24 AM  Result Value Ref Range   WBC 9.5 4.0 - 10.5 K/uL   RBC 2.46 (L) 3.87 - 5.11 MIL/uL    Hemoglobin 6.6 (LL) 12.0 - 15.0 g/dL    Comment: REPEATED TO VERIFY CRITICAL RESULT CALLED TO, READ BACK BY AND VERIFIED WITH: C.BUNGQUE,RN 0310 07/08/15 M.CAMPBELL    HCT 20.0 (L) 36.0 - 46.0 %   MCV 81.3 78.0 - 100.0 fL   MCH 26.8 26.0 - 34.0 pg   MCHC 33.0 30.0 - 36.0 g/dL   RDW 17.7 (H) 11.5 - 15.5 %   Platelets 136 (L) 150 - 400 K/uL  Prepare RBC     Status: None   Collection Time: 07/08/15  3:50 AM  Result Value Ref Range   Order Confirmation ORDER PROCESSED BY BLOOD BANK   Hemoglobin and hematocrit, blood     Status: Abnormal   Collection Time: 07/08/15 12:20 PM  Result Value Ref Range   Hemoglobin 9.2 (L) 12.0 - 15.0 g/dL    Comment: POST TRANSFUSION SPECIMEN   HCT 28.1 (L) 36.0 - 46.0 %  Protime-INR     Status: None   Collection Time: 07/08/15 12:20 PM  Result Value Ref Range   Prothrombin Time 14.5 11.6 - 15.2 seconds   INR 1.11 0.00 - 1.49   Nm Gi Blood Loss  07/07/2015   CLINICAL DATA:  Lower GI bleed.  EXAM: NUCLEAR MEDICINE GASTROINTESTINAL BLEEDING SCAN  TECHNIQUE: Sequential abdominal images were obtained following intravenous administration of Tc-40mlabeled red blood cells.  RADIOPHARMACEUTICALS:  25.0 MCi Tc-971mn-vitro labeled red cells.  COMPARISON:  Multiple exams, including 01/03/2015  FINDINGS: Over a 2 hr observation period, there is slow filling of a central pelvic structure thought represent free technetium excretion into the urinary bladder. Appearance not changed from 01/23/2015.  No bowel activity observed.  IMPRESSION: 1. No detectable gastrointestinal tract bleeding over the 2 hr observation.   Electronically Signed   By: WaVan Clines.D.   On: 07/07/2015 11:58   Ct Abdomen Pelvis W Contrast  07/06/2015   CLINICAL DATA:  [age]ear old female with bloody stools and abdominal pain. Personal history of GI bleed. Subsequent encounter.  EXAM: CT ABDOMEN AND PELVIS WITH CONTRAST  TECHNIQUE: Multidetector CT imaging of the abdomen and pelvis was performed  using the standard protocol following bolus administration of intravenous contrast.  CONTRAST:  10073mMNIPAQUE IOHEXOL 300  MG/ML  SOLN  COMPARISON:  GI bleeding study 01/23/2015. CT Abdomen and Pelvis 09/24/2012.  FINDINGS: Mild respiratory motion artifact at the lung bases which appear stable. No pericardial or pleural effusion.  No acute osseous abnormality identified. Lumbar facet degeneration. Degenerative changes at the pubic symphysis.  Diminutive bladder. Gaseous distension of the rectum. The uterus and adnexa appear to be surgically absent as before.  Redundant sigmoid colon with extensive diverticulosis. Gas and stool mildly distending the sigmoid. Extensive diverticulosis of the left colon and at the splenic flexure. Diverticulosis continues throughout the transverse colon. Moderate diverticulosis of the right colon. No large bowel wall thickening or mesenteric stranding identified. Postoperative changes which appear related to gastrojejunostomy in the upper abdomen. Fluid in contrast filled small bowel loops. Oral contrast has not reached the terminal ileum. No dilated small bowel. No definite inflamed bowel loops. The appendix appears to be normal on series 3, image 54.  Stable left lobe hepatic cysts. The gallbladder appears within normal limits. No abdominal free fluid identified. Spleen, pancreas and adrenal glands are within normal limits. The portal venous system and major arterial structures are patent. Calcified iliac artery atherosclerosis. No lymphadenopathy identified. Small bilateral low-density renal areas are too small to characterize but most likely benign cysts. Renal contrast excretion appears symmetric.  IMPRESSION: No focal inflammatory process identified in the abdomen or pelvis. There is severe and widespread colonic diverticulosis. Consider diverticular bleeding in this setting.   Electronically Signed   By: Genevie Ann M.D.   On: 07/06/2015 18:05       Assessment/Plan 1.  Recurrently LGI bleed, secondary to diverticulosis -as long as the patient continued to NOT bleed, we would hold off on an urgent/emergent operation.  I have contacted our office though to get her set up with Dr. Marcello Moores to discuss elective subtotal colectomy with primary anastomosis, vs J-pouch, vs ileostomy, etc. -cont current care per primary service and GI.  If she acutely bleeds again, would continue original algorithm of c-scope, arteriogram, etc prior to surgical intervention. -thank you for this consultation.  I have discussed these plans with the patient as well as her family over the phone.  Zynia Wojtowicz E 07/08/2015, 1:16 PM Pager: [phone]

## 2015-07-08 NOTE — Progress Notes (Signed)
Daily Rounding Note  07/08/2015, 8:58 AM  LOS: 3 days   SUBJECTIVE:       Got 2 more PRBCs this AM for drop in Hgb.  Last stool/hematochexia was in PM of 9/4.  Feels a bit SOB when standing but has not been walking.   OBJECTIVE:         Vital signs in last 24 hours:    Temp:  [97.8 F (36.6 C)-98.8 F (37.1 C)] 98.8 F (37.1 C) (09/06 0823) Pulse Rate:  [84-102] 93 (09/06 0823) Resp:  [16-18] 16 (09/06 0823) BP: (118-155)/(65-93) 153/93 mmHg (09/06 0823) SpO2:  [98 %-100 %] 100 % (09/06 0823) Last BM Date: 07/06/15 Filed Weights   07/07/15 0753  Weight: 184 lb 11.9 oz (83.8 kg)   General: looks well, comfortable   Heart: RRR Chest: clear bil  Abdomen: soft, NT, ND  Extremities: no CCE Neuro/Psych:  Oriented x 3.  Seems down today.  No gross deficits.   Intake/Output from previous day: 09/05 0701 - 09/06 0700 In: 280 [P.O.:250; Blood:30] Out: 700 [Urine:700]  Intake/Output this shift: Total I/O In: 325 [Blood:325] Out: -   Lab Results:  Recent Labs  07/07/15 0743 07/07/15 1528 07/08/15 0224  WBC 8.7 12.1* 9.5  HGB 7.3* 8.2* 6.6*  HCT 22.2* 24.6* 20.0*  PLT 122* PLATELET CLUMPS NOTED ON SMEAR, COUNT APPEARS DECREASED 136*   BMET  Recent Labs  07/06/15 1134 07/07/15 0256 07/08/15 0224  NA 138 138 141  K 3.2* 3.8 3.6  CL 115* 115* 113*  CO2 19* 20* 23  GLUCOSE 242* 119* 95  BUN 12 13 9   CREATININE 0.76 0.55 0.54  CALCIUM 7.8* 7.5* 7.9*   LFT  Recent Labs  07/05/15 1306  PROT 5.8*  ALBUMIN 3.1*  AST 23  ALT 21  ALKPHOS 67  BILITOT 0.5   PT/INR  Recent Labs  07/05/15 1340  LABPROT 15.1  INR 1.17   Hepatitis Panel No results for input(s): HEPBSAG, HCVAB, HEPAIGM, HEPBIGM in the last 72 hours.  Studies/Results: Nm Gi Blood Loss  07/07/2015   CLINICAL DATA:  Lower GI bleed.  EXAM: NUCLEAR MEDICINE GASTROINTESTINAL BLEEDING SCAN  TECHNIQUE: Sequential abdominal images were  obtained following intravenous administration of Tc-68m labeled red blood cells.  RADIOPHARMACEUTICALS:  25.0 MCi Tc-67m in-vitro labeled red cells.  COMPARISON:  Multiple exams, including 01/03/2015  FINDINGS: Over a 2 hr observation period, there is slow filling of a central pelvic structure thought represent free technetium excretion into the urinary bladder. Appearance not changed from 01/23/2015.  No bowel activity observed.  IMPRESSION: 1. No detectable gastrointestinal tract bleeding over the 2 hr observation.   Electronically Signed   By: Van Clines M.D.   On: 07/07/2015 11:58   Ct Abdomen Pelvis W Contrast  07/06/2015   CLINICAL DATA:  59 year old female with bloody stools and abdominal pain. Personal history of GI bleed. Subsequent encounter.  EXAM: CT ABDOMEN AND PELVIS WITH CONTRAST  TECHNIQUE: Multidetector CT imaging of the abdomen and pelvis was performed using the standard protocol following bolus administration of intravenous contrast.  CONTRAST:  173mL OMNIPAQUE IOHEXOL 300 MG/ML  SOLN  COMPARISON:  GI bleeding study 01/23/2015. CT Abdomen and Pelvis 09/24/2012.  FINDINGS: Mild respiratory motion artifact at the lung bases which appear stable. No pericardial or pleural effusion.  No acute osseous abnormality identified. Lumbar facet degeneration. Degenerative changes at the pubic symphysis.  Diminutive bladder. Gaseous distension of the rectum.  The uterus and adnexa appear to be surgically absent as before.  Redundant sigmoid colon with extensive diverticulosis. Gas and stool mildly distending the sigmoid. Extensive diverticulosis of the left colon and at the splenic flexure. Diverticulosis continues throughout the transverse colon. Moderate diverticulosis of the right colon. No large bowel wall thickening or mesenteric stranding identified. Postoperative changes which appear related to gastrojejunostomy in the upper abdomen. Fluid in contrast filled small bowel loops. Oral contrast has  not reached the terminal ileum. No dilated small bowel. No definite inflamed bowel loops. The appendix appears to be normal on series 3, image 54.  Stable left lobe hepatic cysts. The gallbladder appears within normal limits. No abdominal free fluid identified. Spleen, pancreas and adrenal glands are within normal limits. The portal venous system and major arterial structures are patent. Calcified iliac artery atherosclerosis. No lymphadenopathy identified. Small bilateral low-density renal areas are too small to characterize but most likely benign cysts. Renal contrast excretion appears symmetric.  IMPRESSION: No focal inflammatory process identified in the abdomen or pelvis. There is severe and widespread colonic diverticulosis. Consider diverticular bleeding in this setting.   Electronically Signed   By: Genevie Ann M.D.   On: 07/06/2015 18:05    ASSESMENT:   * Recurrent diverticular bleed . Hx of same on multiple occasions dating back to 2001. though having lower abd pain, CT scan negative for colitis. Nuc RBC scan 9/5 negative.   * ABL anemia. Recurrent. On po iron at home. S/p 6 PRBCs. Hgb dropped overnight, 2 more units ordered this AM.   * Acute thrombocytopenia.  Improved.    * GERD, esophageal dysmotility, anastomotic ulcerations on EGD and esophagram in 06/2015. On po Carafate at home. Waiting on approval for non-generic Nexium the only PPI that really helps her.   * S/p bariatric gastric bypass 2013   PLAN   *  Increase activity, last thing she needs is a DVT.     Azucena Freed  07/08/2015, 8:58 AM Pager: [phone]    I have taken an interval history, reviewed the chart, and examined the patient. I agree with the Advanced Practitioner's note and impression. Overnight she denies any further rectal bleeding, however had a downtrend of H/H. She was transfused 2 units PRBC which she tolerated well with an expected rise in H/H. We discussed plans moving forward. If she has overt  rebleeding during this admission will plan for colonoscopy +/- repeating imaging with tagged RBC scan or CT angio. Otherwise, we discussed that this appears to be most likely diverticular bleeding, suspect left sided based on remote RBC scan. We discussed surgical options moving forward to prevent rebleeding if this is diverticular in etiology. She is amenable to surgery but requested to see them as an inpatient to discuss this further. We have asked surgery to see the patient to discuss this but if she is not having active rebleeding this will likely be done on a more elective basis. She agreed, all questions answered. Avoid NSAIDs and continue to trend H/H today. Please call with any questions.   Goodnight Cellar, MD Pager [phone]

## 2015-07-08 NOTE — Progress Notes (Signed)
Family Medicine Teaching Service Daily Progress Note Intern Pager: [phone]  [name] Medical record number: [id number] Date of birth: Mar 04, 1956 Age: 59 y.o. Gender: female  Primary Care Provider: [name], MD Consultants: GI Code Status: FULL  Pt Overview and Major Events to Date:  9/3: 2u pRBC transfusion  9/3-9/4: Bloody BM x2 with Hgb 5.8: 2u pRBC with post transfusion hgb at 6.9  9/4: GI: no colonoscopy; patient agreeable to discuss with surgeon for elective subtotal colectomy with ileorectal anastomosis on outpatient basis.  9/4: 4 bloody BM around 3pm, with BP 71/40, lightheaded while walking; hgb 6.8: 1L NS bolus and 2 units RBC; post-transfusion hgb 7 with AM hgb 7.3  9/4: noted of abdominal pain: lactic acid 3.3. CT abdomen w/ contrast: No focal inflammatory process identified in the abdomen or pelvis.There is severe and widespread colonic diverticulosis  9/5: RBC scan: No detectable gastrointestinal tract bleeding over the 2 hr observation.  9/5: discontinued home Topamax: has bleeding as SE 9/6: 2am hgb 6.6 (from 8.2 at 1500 9/5): given 2 units pRBC; platelets 136; post-transfusion hgb 9.2   Assessment and Plan: [name] is a 58 y.o. female presenting with GI bleed . PMH is significant for diverticular disease with previous GI bleed; history of diabetes, HLD and hypertension; status post gastric bypass surgery; h/o TIA.   GI bleed: Likely due to diverticulosis, as it has been in the past; also considering ischemic colitis as presentation is associated with abdominal pain. No inciting factors such as NSAIDs or blood thinners noted. Recent colonoscopy with Dr. Deatra Ina in 11/2014 at which time she was noted to have moderate diverticulosis in the ascending colon,the cecum, in the transverse colon, sigmoid colon, and descending colon. EGD Aug 2016 demonstrated superficial linear ulceration at the anastomosis and benign fundic polyp. Barium swallow Aug  2016 showed no evidence of high grade stricture or mass, no mucosal lesions, and mild GERD. She has had 3 GI bleeds this year and was last hospitalized in July. She's status post gastric bypass done in July 2013. INR stable at 1.17. S/p total of 8 units of pRBC this admission. CT abdomen negative for ischemic colitis. RBC scan 9/5: No detectable gastrointestinal tract bleeding over the 2 hr observation. 2am hgb 6.6 (from 8.2 at 1500 9/5): given 2 units pRBC; platelets 136; post-transfusion hgb 9.2.  No BMs overnight. Tachycardia today. Patient is also symptomatic with SOB exertion.  Coags today: INR 1.11, PT 14.5  - Monitor VSS and signs of further bleeding closely - CBC tomorrow AM   - GI consulted appreciate reccs: If she has overt rebleeding during this admission will plan for colonoscopy +/- repeating imaging with tagged RBC scan or CT angio. - surgery consulted per GI: as long as the patient continued to NOT bleed, we would hold off on an urgent/emergent operation; set up with Dr. Marcello Moores to discuss elective subtotal colectomy  - Protonix 40mg  BID PO - Tylenol 650mg  prn for abdominal pain  - would recommend starting daily fiber supplement after discharge   Hypokalemia: repleated w with Kdur 52mEq 9/4 - resolved  Hypocalcemia: 7.9 today.   HTN:  Currently normal BPs therefore will hold home meds - Holding lisinopril for now as BP have not been elevated  - Continue to monitor BP   HLD  - continue home Lipitor   H/o DM: Last A1c 5.4 on 03/2015. Not on any medication.  - will monitor glucose on BMET  GERD: GERD, esophageal dysmotility,  anastomotic ulcerations on EGD and esophagram in 06/2015. Notes of epigastric abdominal pain yesterday evening. Epigastric tenderness noted this morning.  - Protonix 40mg  PO BID - Carafate TID    H/o TIA: Patient states there are no deficits from stoke and that she was unaware she had one until it was noted on imaging.   FEN/GI: NS @10  mL/hour to  maintain IV access, regular diet per GI; Protonix  Prophylaxis: SCDs; No anticoagulation due to acute bleed  Disposition: home pending stable hemoglobin   Subjective:  Patient's hgb early morning noted to be 6.6 (from 8.2). Given 2 units pRBC. Denies having BMs overnight. Denies abdominal pain this morning but states she had epigastric pain yesterday evening and required PRN roxicodone x 1. Denies lightheadedness, chest pain, but notes of SOB with sitting up to use the bathroom. Also notes of left frontal HA this morning that is achy with associated photophobia and is 7/10; patient was given Tylenol which helped ease off pain. Patient usually does not have headaches similar to this but notes she had a headache prior to having bloody BMs on 9/4. Ate breakfast this morning but has not had much of an appetite.   Objective: Temp:  [97.8 F (36.6 C)-98.3 F (36.8 C)] 98 F (36.7 C) (09/06 0600) Pulse Rate:  [84-102] 102 (09/06 0346) (tachy this morning) Resp:  [16-18] 16 (09/06 0346) BP: (118-140)/(65-87) 137/87 mmHg (09/06 0600) SpO2:  [98 %-100 %] 100 % (09/06 0434) Weight:  [184 lb 11.9 oz (83.8 kg)] 184 lb 11.9 oz (83.8 kg) (09/05 0753) Physical Exam: General: NAD, lying in bed  Eyes: pallor of conjunctiva  Cardiovascular: RR but tachycardic. 3/6 systolic murmur heard best in right upper sternal border (more prominent today). 2+ distal pulses, <2sec cap refill.  Respiratory: CTAB bilaterally  Abdomen: soft, epigastric tenderness, no guarding, no rebound tenderness; no oragnomegaly, +BS Extremities: no swelling or calf tenderness, 2+ distal pulses Neuro: alert, no gross deficits  Laboratory:  Recent Labs Lab 07/07/15 0743 07/07/15 1528 07/08/15 0224 07/08/15 1220  WBC 8.7 12.1* 9.5  --   HGB 7.3* 8.2* 6.6* 9.2*  HCT 22.2* 24.6* 20.0* 28.1*  PLT 122* PLATELET CLUMPS NOTED ON SMEAR, COUNT APPEARS DECREASED 136*  --     Recent Labs Lab 07/05/15 1306  07/06/15 1134  07/07/15 0256 07/08/15 0224  NA 140  < > 138 138 141  K 3.6  < > 3.2* 3.8 3.6  CL 111  < > 115* 115* 113*  CO2 24  < > 19* 20* 23  BUN 14  < > 12 13 9   CREATININE 0.69  < > 0.76 0.55 0.54  CALCIUM 8.6*  < > 7.8* 7.5* 7.9*  PROT 5.8*  --   --   --   --   BILITOT 0.5  --   --   --   --   ALKPHOS 67  --   --   --   --   ALT 21  --   --   --   --   AST 23  --   --   --   --   GLUCOSE 161*  < > 242* 119* 95  < > = values in this interval not displayed.   Smiley Houseman, MD 07/08/2015, 1:25 PM PGY-1, [facility] Intern pager: [phone], text pages welcome

## 2015-07-08 NOTE — Progress Notes (Signed)
CRITICAL VALUE ALERT  Critical value received:  hgb 6.6  Date of notification:  9/6  Time of notification:  0333  Critical value read back:yes  Nurse who received alert:  Deloris Ping  MD notified (1st page):  Lovena Le  Time of first page: 0332  MD notified (2nd page):  Time of second page:  Responding MD:  Time MD responded:

## 2015-07-08 NOTE — Progress Notes (Signed)
No rectal bleeding noted the whole shift , endorsed to call GI md for any rectal bleeding.

## 2015-07-08 NOTE — Care Management Important Message (Signed)
Important Message  Patient Details  Name: [name] MRN: [id number] Date of Birth: [birth year]   Medicare Important Message Given:  Yes-second notification given    Delorse Lek 07/08/2015, 11:56 AM

## 2015-07-08 NOTE — Telephone Encounter (Signed)
Beth, RN with [facility] called needing a referral placed to [facility] for patient to see Dr. Leighton Ruff for possible colotomy.  Patient has [employer].  Please give her a call at [phone].  Derl Barrow, RN

## 2015-07-09 ENCOUNTER — Other Ambulatory Visit: Payer: Self-pay | Admitting: Family Medicine

## 2015-07-09 LAB — BASIC METABOLIC PANEL
Anion gap: 7 (ref 5–15)
BUN: 8 mg/dL (ref 6–20)
CO2: 25 mmol/L (ref 22–32)
Calcium: 8.2 mg/dL — ABNORMAL LOW (ref 8.9–10.3)
Chloride: 109 mmol/L (ref 101–111)
Creatinine, Ser: 0.6 mg/dL (ref 0.44–1.00)
GFR calc Af Amer: >60 mL/min (ref >60)
GFR calc non Af Amer: >60 mL/min (ref >60)
Glucose, Bld: 89 mg/dL (ref 65–99)
Potassium: 3.3 mmol/L — ABNORMAL LOW (ref 3.5–5.1)
Sodium: 141 mmol/L (ref 135–145)

## 2015-07-09 LAB — TYPE AND SCREEN
ABO/RH(D): O POS
Antibody Screen: NEGATIVE
Unit division: 0
Unit division: 0
Unit division: 0
Unit division: 0
Unit division: 0
Unit division: 0
Unit division: 0
Unit division: 0

## 2015-07-09 LAB — CBC
HCT: 24.8 % — ABNORMAL LOW (ref 36.0–46.0)
HCT: 28.5 % — ABNORMAL LOW (ref 36.0–46.0)
Hemoglobin: 8.3 g/dL — ABNORMAL LOW (ref 12.0–15.0)
Hemoglobin: 9.1 g/dL — ABNORMAL LOW (ref 12.0–15.0)
MCH: 27.9 pg (ref 26.0–34.0)
MCH: 27.3 pg (ref 26.0–34.0)
MCHC: 33.5 g/dL (ref 30.0–36.0)
MCHC: 31.9 g/dL (ref 30.0–36.0)
MCV: 83.2 fL (ref 78.0–100.0)
MCV: 85.6 fL (ref 78.0–100.0)
Platelets: 144 10*3/uL — ABNORMAL LOW (ref 150–400)
Platelets: 181 10*3/uL (ref 150–400)
RBC: 2.98 MIL/uL — ABNORMAL LOW (ref 3.87–5.11)
RBC: 3.33 MIL/uL — ABNORMAL LOW (ref 3.87–5.11)
RDW: 17.3 % — ABNORMAL HIGH (ref 11.5–15.5)
RDW: 17.9 % — ABNORMAL HIGH (ref 11.5–15.5)
WBC: 10 10*3/uL (ref 4.0–10.5)
WBC: 11.6 10*3/uL — ABNORMAL HIGH (ref 4.0–10.5)

## 2015-07-09 MED ORDER — LISINOPRIL 20 MG PO TABS
20.0000 mg | ORAL_TABLET | Freq: Every day | ORAL | Status: DC
  Administered 2015-07-09: 20 mg via ORAL
  Filled 2015-07-09: qty 1

## 2015-07-09 MED ORDER — PANTOPRAZOLE SODIUM 40 MG PO PACK: 40.0000 mg | PACK | Freq: Every day | ORAL | Status: DC

## 2015-07-09 MED ORDER — POTASSIUM CHLORIDE CRYS ER 20 MEQ PO TBCR
40.0000 meq | EXTENDED_RELEASE_TABLET | Freq: Once | ORAL | Status: AC
  Administered 2015-07-09: 40 meq via ORAL
  Filled 2015-07-09: qty 2

## 2015-07-09 MED ORDER — PANTOPRAZOLE SODIUM 40 MG PO PACK
40.0000 mg | PACK | Freq: Two times a day (BID) | ORAL | Status: DC
  Filled 2015-07-09: qty 20

## 2015-07-09 MED ORDER — HYDRALAZINE HCL 20 MG/ML IJ SOLN
5.0000 mg | Freq: Once | INTRAMUSCULAR | Status: AC
  Administered 2015-07-09: 5 mg via INTRAVENOUS
  Filled 2015-07-09: qty 1

## 2015-07-09 MED ORDER — SODIUM CHLORIDE 0.9 % IV SOLN
510.0000 mg | Freq: Once | INTRAVENOUS | Status: AC
  Administered 2015-07-09: 510 mg via INTRAVENOUS
  Filled 2015-07-09: qty 17

## 2015-07-09 NOTE — Progress Notes (Signed)
Pt d/c per md order. All appropriate paperwork is signed, and IV is out. Pt verbalizes understanding of education and f/u appts. Waiting on ride to come and get her. NAD VSS.

## 2015-07-09 NOTE — Discharge Instructions (Signed)
You were admitted for bleeding likely from your diverticulosis in your colon.   Please seek immediate medical care if you start to have bleeding again.   We have set up an appointment with Dr. Ree Kida next week for hospital follow up. The GI doctors would also like you to see a surgeon to discuss an elective colectomy since you have had multiple episodes of bleeding this year.

## 2015-07-09 NOTE — Progress Notes (Signed)
Family Medicine Teaching Service Daily Progress Note Intern Pager: [phone]  [name] Medical record number: [id number] Date of birth: 01-08-1956 Age: 59 y.o. Gender: female  Primary Care Provider: [name], MD Consultants: GI, surgery Code Status: FULL  Pt Overview and Major Events to Date:  9/3: 2u pRBC transfusion  9/3-9/4: Bloody BM x2 with Hgb 5.8: 2u pRBC with post transfusion hgb at 6.9  9/4: GI: no colonoscopy; patient agreeable to discuss with surgeon for elective subtotal colectomy with ileorectal anastomosis on outpatient basis.  9/4: 4 bloody BM around 3pm, with BP 71/40, lightheaded while walking; hgb 6.8: 1L NS bolus and 2 units RBC; post-transfusion hgb 7 with AM hgb 7.3  9/4: noted of abdominal pain: lactic acid 3.3. CT abdomen w/ contrast: No focal inflammatory process identified in the abdomen or pelvis.There is severe and widespread colonic diverticulosis  9/5: RBC scan: No detectable gastrointestinal tract bleeding over the 2 hr observation.  9/5: discontinued home Topamax: has bleeding as SE 9/6: 2am hgb 6.6 (from 8.2 at 1500 9/5): given 2 units pRBC; platelets 136; post-transfusion hgb 9.2  Assessment and Plan: [name] is a 59 y.o. female presenting with GI bleed. PMH is significant for diverticular disease with previous GI bleed; history of diabetes, HLD and hypertension; status post gastric bypass surgery; h/o TIA.   GI bleed: Likely due to diverticulosis, as it has been in the past; also considering ischemic colitis as presentation is associated with abdominal pain. No inciting factors such as NSAIDs or blood thinners noted. Recent colonoscopy with Dr. Deatra Ina in 11/2014 at which time she was noted to have moderate diverticulosis in the ascending colon,the cecum, in the transverse colon, sigmoid colon, and descending colon. EGD Aug 2016 demonstrated superficial linear ulceration at the anastomosis and benign fundic polyp. Barium swallow  Aug 2016 showed no evidence of high grade stricture or mass, no mucosal lesions, and mild GERD. She has had 3 GI bleeds this year and was last hospitalized in July. She's status post gastric bypass done in July 2013. INR stable at 1.17. S/p total of 8 units of pRBC this admission. CT abdomen negative for ischemic colitis. RBC scan 9/5: No detectable gastrointestinal tract bleeding over the 2 hr observation. 9/3 iron studies: Fe 10, TIBC 459, Ferritin 12. Coags stable (INR 1.11; PT 14.5) 9/6. Hgb 8.3 this am (from 9.2 at 12pm 9/6). No BM since 9/4; no overt bleeding since 9/4.  - CBC at 1500  - Monitor VSS and signs of further bleeding closely - GI consulted appreciate reccs: If hgb stable consider discharge later today or tomorrow.Patient will have follow up with surgery to discuss surgical treatment of diverticulosis; If she rebleeds she will need colonoscopy and imaging again in further attempts to definitively localize the source and guide surgical therapy; Should go to [facility] teaching service) lab for CBC within 7 to 10 days of discharge; spoke with PA this morning who recommended treating with parenteral iron (will order).  - surgery consulted per GI: as long as the patient continued to NOT bleed, we would hold off on an urgent/emergent operation; set up with Dr. Marcello Moores to discuss elective subtotal colectomy  - Protonix 40mg  BID PO - Tylenol 650mg  prn for abdominal pain  - would recommend starting daily fiber supplement after discharge   Hypokalemia: 3.3 today (from 3.6) - will repleat with KCl 9mEq today Hypocalcemia: 8.2 today.   HTN:  Held home lisinopril initially for low/stable BP. Elevated BP  this am at 168/104. - Continue to monitor BP  - restarted home lisinopril - Hydralizine 5mg  IV once   HLD  - continue home Lipitor   H/o DM: Last A1c 5.4 on 03/2015. Not on any medication.  - will monitor glucose on BMET  GERD: GERD, esophageal dysmotility, anastomotic ulcerations on  EGD and esophagram in 06/2015. Notes of epigastric abdominal pain yesterday evening. Epigastric tenderness noted this morning.  - Protonix 40mg  PO BID - Carafate TID    H/o TIA: Patient states there are no deficits from stoke and that she was unaware she had one until it was noted on imaging.   FEN/GI: NS @10  mL/hour to maintain IV access, regular diet per GI; Protonix  Prophylaxis: SCDs; No anticoagulation due to acute bleed  Disposition: home pending stable hemoglobin this afternoon   Subjective:  No events overnight. States she has a left frontal throbbing headache this morning. BP on the monitor was noted to be 168/104. Spoke with the nurse regarding BP, and she stated that this result was a recheck. Restarted home Lisinopril this morning.     Objective: Temp:  [97.8 F (36.6 C)-98.8 F (37.1 C)] 98.8 F (37.1 C) (09/07 0846) Pulse Rate:  [84-103] 103 (09/07 0846) Resp:  [16-18] 18 (09/07 0846) BP: (130-168)/(65-105) 168/104 mmHg (09/07 0846) SpO2:  [98 %-100 %] 98 % (09/07 0846) Physical Exam: General: NAD, lying in bed  Eyes: pallor of conjunctiva  Cardiovascular: RR but tachycardic. 2/6 systolic murmur heard best in right upper sternal border (more prominent today). 2+ distal pulses, <2sec cap refill.  Respiratory: CTAB bilaterally  Abdomen: soft, no tenderness, no guarding, no rebound tenderness; no oragnomegaly, +BS Extremities: no swelling or calf tenderness, 2+ distal pulses Neuro: alert, no gross deficits  Laboratory:  Recent Labs Lab 07/07/15 1528 07/08/15 0224 07/08/15 1220 07/09/15 0244  WBC 12.1* 9.5  --  10.0  HGB 8.2* 6.6* 9.2* 8.3*  HCT 24.6* 20.0* 28.1* 24.8*  PLT PLATELET CLUMPS NOTED ON SMEAR, COUNT APPEARS DECREASED 136*  --  144*    Recent Labs Lab 07/05/15 1306  07/07/15 0256 07/08/15 0224 07/09/15 0244  NA 140  < > 138 141 141  K 3.6  < > 3.8 3.6 3.3*  CL 111  < > 115* 113* 109  CO2 24  < > 20* 23 25  BUN 14  < > 13 9 8    CREATININE 0.69  < > 0.55 0.54 0.60  CALCIUM 8.6*  < > 7.5* 7.9* 8.2*  PROT 5.8*  --   --   --   --   BILITOT 0.5  --   --   --   --   ALKPHOS 67  --   --   --   --   ALT 21  --   --   --   --   AST 23  --   --   --   --   GLUCOSE 161*  < > 119* 95 89  < > = values in this interval not displayed.   Smiley Houseman, MD 07/09/2015, 9:50 AM PGY-1, [facility] Intern pager: [phone], text pages welcome

## 2015-07-09 NOTE — Progress Notes (Signed)
Daily Rounding Note  07/09/2015, 9:04 AM  LOS: 4 days   SUBJECTIVE:  Complaining of headaches also still having pain in belly, upper abdomen, only at night and not related to po intake or to position (occurs sitting or lying down).  No BM yesterday or today.  Eating well.        OBJECTIVE:         Vital signs in last 24 hours:    Temp:  [97.8 F (36.6 C)-98.8 F (37.1 C)] 98.8 F (37.1 C) (09/07 0846) Pulse Rate:  [84-103] 103 (09/07 0846) Resp:  [16-18] 18 (09/07 0846) BP: (130-168)/(65-105) 168/104 mmHg (09/07 0846) SpO2:  [98 %-100 %] 98 % (09/07 0846) Last BM Date: 07/06/15 Filed Weights   07/07/15 0753  Weight: 184 lb 11.9 oz (83.8 kg)   General: pleasant, comfortable.  Looks well   Heart: RRR Chest: clear bil.   Abdomen: soft, NT, ND.  Active BS.   Extremities: no edema Neuro/Psych:  Pleasant, no deficits, oriented x 3 and fully alert.   Intake/Output from previous day: 09/06 0701 - 09/07 0700 In: 1420 [P.O.:790; Blood:630] Out: 300 [Urine:300]  Intake/Output this shift:    Lab Results:  Recent Labs  07/07/15 1528 07/08/15 0224 07/08/15 1220 07/09/15 0244  WBC 12.1* 9.5  --  10.0  HGB 8.2* 6.6* 9.2* 8.3*  HCT 24.6* 20.0* 28.1* 24.8*  PLT PLATELET CLUMPS NOTED ON SMEAR, COUNT APPEARS DECREASED 136*  --  144*   BMET  Recent Labs  07/07/15 0256 07/08/15 0224 07/09/15 0244  NA 138 141 141  K 3.8 3.6 3.3*  CL 115* 113* 109  CO2 20* 23 25  GLUCOSE 119* 95 89  BUN 13 9 8   CREATININE 0.55 0.54 0.60  CALCIUM 7.5* 7.9* 8.2*   LFT No results for input(s): PROT, ALBUMIN, AST, ALT, ALKPHOS, BILITOT, BILIDIR, IBILI in the last 72 hours. PT/INR  Recent Labs  07/08/15 1220  LABPROT 14.5  INR 1.11   Hepatitis Panel No results for input(s): HEPBSAG, HCVAB, HEPAIGM, HEPBIGM in the last 72 hours.  Studies/Results: Nm Gi Blood Loss  07/07/2015   CLINICAL DATA:  Lower GI bleed.  EXAM: NUCLEAR  MEDICINE GASTROINTESTINAL BLEEDING SCAN  TECHNIQUE: Sequential abdominal images were obtained following intravenous administration of Tc-41m labeled red blood cells.  RADIOPHARMACEUTICALS:  25.0 MCi Tc-2m in-vitro labeled red cells.  COMPARISON:  Multiple exams, including 01/03/2015  FINDINGS: Over a 2 hr observation period, there is slow filling of a central pelvic structure thought represent free technetium excretion into the urinary bladder. Appearance not changed from 01/23/2015.  No bowel activity observed.  IMPRESSION: 1. No detectable gastrointestinal tract bleeding over the 2 hr observation.   Electronically Signed   By: Van Clines M.D.   On: 07/07/2015 11:58   Scheduled Meds: . atorvastatin  40 mg Oral q1800  . fluticasone  2 spray Each Nare Daily  . pantoprazole  40 mg Oral BID  . sucralfate  1 g Oral TID WC & HS   Continuous Infusions: . 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 Hx of same on multiple occasions dating back to 2001.Lower abd pain with negative CT. RBC scan 9/5, negative.   * ABL anemia. Recurrent. On po iron at home. S/p 6 PRBCs. Iron studies with IDA. On BID oral iron at home.   *  Acute thrombocytopenia. Improved.   * GERD, esophageal dysmotility, anastomotic ulcerations on EGD and esophagram in 06/2015. On po Carafate at home. Waiting on approval for non-generic Nexium, the only PPI that really helps her. For inpt stay on BID Protonix and the Carafate. .   * S/p bariatric gastric bypass 2003   *  New onset Head aches.  Her BP is up and thus far her usual htn meds have been held.  Would restart and see what happens with headaches.    PLAN   *  Again, may want to treat her with parenteral iron, as iron deficient despite home BID oral iron.  *  Possible discharge later today?.  Should go to [facility] teaching service)  lab for CBC within 7 to 10 days of discharge.  *  gen surgery will set up OV with Dr Marcello Moores re discussing elective total colectomy      [name]  07/09/2015, 9:04 AM Pager: [phone]   Staff Addendum: I have taken an interval history, reviewed the chart, and examined the patient. I agree with the Advanced Practitioner's note and impression. No clinical evidence of overt GI bleeding since the weekend. Eating regular diet and tolerating it well. I have reviewed prior imaging to include multiple tagged RBC scans, Meckel's scan, CT, prior EGD and colonoscopy. She had a positive tagged RBC scan in the setting of lower GI bleed in 2009 with bleeding noted in the splenic flexure of the colon. While we cannot be certain where she bled from recently (tagged RBC negative and CT angio negative at time of this admission), my suspicion is left sided colonic diverticular bleeding given her prior history, although she has pancolonic diverticulosis. Small bowel bleeding seems less likely. No clear abnormality of the small bowel on CT. She had an EGD August 2016 with Dr. Deatra Ina showing a mild marginal ulcer. I think it's unlikely she is having upper tract bleeding, BUN has been normal and no melena, although perhaps this is related to her intermittent upper abdominal pain. CT without pathology to cause her pain. On questioning her this morning her discomfort has resolved. Recommend switching her to liquid suspension PPI (nexium or prevacid if liquid suspension available) or have her open protonix capsules and eat with food (apple sauce) to improve her absorption of it given her gastric bypass state. Otherwise, recommend repeating CBC later today to ensure Hgb is stable given mild downtrend this morning.  If stable consider discharge later today or tomorrow. Patient will have follow up with surgery to discuss surgical treatment of diverticulosis. She was counseled should she re-bleed she needs to seek immediate  evaluation. If she rebleeds she will need colonoscopy and imaging again in further attempts to definitively localize the source and guide surgical therapy. Patient agreed and verbalized understanding. Please call with questions / concerns. Agree with iron supplementation.    Cellar, MD [facility] Pager [phone]

## 2015-07-09 NOTE — Discharge Summary (Signed)
[facility] Discharge Summary  [name] Medical record number: [id number] Date of birth: 04/27/1956 Age: 59 y.o. Gender: female Date of Admission: 07/05/2015  Date of Discharge: 07/09/15 Admitting Physician: Kinnie Feil, MD  Primary Care Provider: [name], MD Consultants: GI, surgery  Indication for Hospitalization: GI Bleed: Bloody Bowel Movements; Symptomatic Anemia  Discharge Diagnoses/Problem List: GI Bleed likely a diverticular bleed  Disposition: home  Discharge Condition: improved  Brief Hospital Course:  [name] is a 59 y.o. female who presented with bright red blood per rectum. Bleeding was associated with 3 loose bowel movements, bleeding increased in amount with each subsequent BM. BRBPR was preceded by abdominal pain x 2 days. Associated symptoms included lower quadrant abdominal pain, nausea without vomiting, dizziness with standing, and without loss of consciousness, falls, headaches, chest pain, or change in appetite. No inciting factors such as NSAIDs or blood thinners were identified.  Past medical history significant for diverticular disease with previous GI bleed, DM2, HTN; status post bypass surgeryin 2013 and history of TIA.   Symptomatic Anemia 2/2 GI Bleed:  GI likely due to diverticular bleed. A recent colonoscopy with Dr. Deatra Ina in 1/016 showed moderate diverticulosis in the ascending colon, cecum, transverse colon, sigmoid colon, an descending colon. An EGD in August 2016 demonstrated superficial linear ulceration at the anastomosis and benign fundic polyp. Barium swallow in August 2016 showed no evidence of high grade stricture or mass, no mucosal lesions, and mild GRED. Hemoglobin was noted to be 6.6 on admission; patient's baseline thought to be about 8. INR was stable.  Iron studies showed Fe 10, TIBC 459. Patient was started on IV PPI. GI was consulted. Between 9/3 and 9/4, patient had 6 bloody bowel  movements which subsequently resulted in a drop in hemoglobin and symptomatic anemia with lightheadedness and low blood pressure. Patient received 6 units of packed RBC over three days with appropriate rise in hemoglobin. Due to complaint of abdominal pain and a lactic acid of 3.3, CT abdomen with contrast was completed which showed no signs of ischemic colitis. RBC scan was done by GI which did not show detectable GI bleeding over 2 hour observation period. Patient did not have further episodes of overt bleeding, however she required 2 more units of packed RBC due to a drop in hemoglobin to 6.6 from 8.2 with good response (hgb 9.2). Her hemoglobin remained stable for approximately 27 hours; hemoglobin was 9.1 prior to discharge. Patient received a total of 8 units of packed RBC. Repeat coag studies were stable. Per GI, patient received parenteral iron x 1 dose due to iron deficiency anemia. GI recommended elective surgery for definitive treatment since patient has had multiple GI bleed episodes this year; surgery evaluated patient and agreed to see patient on an outpatient basis since she had no further overt bleeding episodes. Of note, Topamax was discontinued due to possible side effect of bleeding.   Hypokalemia: Patient noted to have hypokalemia. Potassium was supplemented and levels rose appropriately.   HTN: Patient's home Lisinopril was held due to low/stable blood pressures. This was restarted prior to discharge. Patient's blood pressure and vitals were stable on discharge.    HLD: Continued home Lipitor  Issues for Follow Up:  - consider repeat CBC to assess hemoglobin level - consider starting daily fiber supplement  - patient has appointment with Dr. Marcello Moores to discuss elective colectomy (07/23/15)  Significant Procedures: RBC scan as noted above, CT abdomen as noted  above.   Significant Labs and Imaging:   Recent Labs Lab 07/08/15 0224 07/08/15 1220 07/09/15 0244 07/09/15 1455   WBC 9.5  --  10.0 11.6*  HGB 6.6* 9.2* 8.3* 9.1*  HCT 20.0* 28.1* 24.8* 28.5*  PLT 136*  --  144* 181    Recent Labs Lab 07/05/15 1306 07/06/15 0112 07/06/15 1134 07/07/15 0256 07/08/15 0224 07/09/15 0244  NA 140 139 138 138 141 141  K 3.6 3.1* 3.2* 3.8 3.6 3.3*  CL 111 113* 115* 115* 113* 109  CO2 24 20* 19* 20* 23 25  GLUCOSE 161* 141* 242* 119* 95 89  BUN 14 14 12 13 9 8   CREATININE 0.69 0.69 0.76 0.55 0.54 0.60  CALCIUM 8.6* 7.8* 7.8* 7.5* 7.9* 8.2*  ALKPHOS 67  --   --   --   --   --   AST 23  --   --   --   --   --   ALT 21  --   --   --   --   --   ALBUMIN 3.1*  --   --   --   --   --     Results/Tests Pending at Time of Discharge: none  Discharge Medications:    Medication List    STOP taking these medications        potassium chloride SA 20 MEQ tablet  Commonly known as:  K-DUR,KLOR-CON     topiramate 50 MG tablet  Commonly known as:  TOPAMAX      TAKE these medications        atorvastatin 40 MG tablet  Commonly known as:  LIPITOR  Take 1 tablet (40 mg total) by mouth daily.     calcium carbonate 600 MG Tabs tablet  Commonly known as:  OS-CAL  Take 1 tablet (600 mg total) by mouth 2 (two) times daily with a meal.     Cetirizine HCl 10 MG Caps  Commonly known as:  ZYRTEC ALLERGY  Take 1 capsule (10 mg total) by mouth daily.     docusate sodium 100 MG capsule  Commonly known as:  COLACE  Take 1 capsule (100 mg total) by mouth 2 (two) times daily as needed. For constipation     esomeprazole 40 MG capsule  Commonly known as:  NEXIUM  Take 1 capsule (40 mg total) by mouth daily.     ferrous sulfate 220 (44 FE) MG/5ML solution  Take 268.4 mg by mouth 2 (two) times daily with a meal. 6.27ml twice daily     fluticasone 50 MCG/ACT nasal spray  Commonly known as:  FLONASE  Place 2 sprays into both nostrils at bedtime.     lisinopril 20 MG tablet  Commonly known as:  PRINIVIL,ZESTRIL  TAKE ONE TABLET BY MOUTH ONCE DAILY     multivitamin  tablet  Take 1 tablet by mouth daily.     polyethylene glycol powder powder  Commonly known as:  GLYCOLAX/MIRALAX  TAKE 17G BY MOUTH TWICE DAILY     sucralfate 1 GM/10ML suspension  Commonly known as:  CARAFATE  Take 10 mLs (1 g total) by mouth 4 (four) times daily -  with meals and at bedtime.     Vitamins A & D 5000-400 UNITS Caps  Take 1 tablet by mouth daily.     zolpidem 10 MG tablet  Commonly known as:  AMBIEN  TAKE ONE TABLET AT BEDTIME AS NEEDED FOR SLEEP  Discharge Instructions: Please refer to Patient Instructions section of EMR for full details.  Patient was counseled important signs and symptoms that should prompt return to medical care, changes in medications, dietary instructions, activity restrictions, and follow up appointments.   Follow-Up Appointments:     Follow-up Information    Follow up with Rosario Adie., MD On 0/08/9322.   Specialty:  General Surgery   Why:  10:00am, arrive no later than 9:30am for paperwork   Contact information:   [address] [place] [address] [phone]       Follow up with [name], MD.   Specialty:  [facility]   Contact information:   [place] [address] [phone]       Follow up with [name], MD On 07/17/2015.   Specialty:  Family Medicine   Why:  at 8:45am   Contact information:   [place] [address] [phone]       Smiley Houseman, MD 07/09/2015, 7:35 PM PGY-1, [place]

## 2015-07-10 ENCOUNTER — Telehealth: Payer: Self-pay | Admitting: Internal Medicine

## 2015-07-10 MED ORDER — ESOMEPRAZOLE MAGNESIUM 40 MG PO CPDR: 40.0000 mg | DELAYED_RELEASE_CAPSULE | Freq: Two times a day (BID) | ORAL | Status: DC

## 2015-07-10 NOTE — Telephone Encounter (Signed)
Called patient to ensure that she has Nexium after hospital discharge. States that she does not have enough. Stated that I will call back after I send prescription. Sent in a prescription for pharmacy. Called patient and left a message on voicemail.

## 2015-07-13 ENCOUNTER — Other Ambulatory Visit: Payer: Self-pay | Admitting: Family Medicine

## 2015-07-14 ENCOUNTER — Telehealth: Payer: Self-pay | Admitting: *Deleted

## 2015-07-14 NOTE — Telephone Encounter (Signed)
Prior Authorization received from [employer] for nexium. PA completed online at [email or web address].  Received PA approval for Nexium 40 mg via [employer].   [facility] pharmacy informed.  PA case number [id number]. Derl Barrow, RN

## 2015-07-17 ENCOUNTER — Ambulatory Visit (INDEPENDENT_AMBULATORY_CARE_PROVIDER_SITE_OTHER): Payer: Commercial Managed Care - HMO | Admitting: Family Medicine

## 2015-07-17 ENCOUNTER — Encounter: Payer: Self-pay | Admitting: Family Medicine

## 2015-07-17 VITALS — BP 132/84 | HR 85 | Temp 98.4°F | Ht 64.0 in | Wt 197.0 lb

## 2015-07-17 DIAGNOSIS — I10 Essential (primary) hypertension: Secondary | ICD-10-CM | POA: Diagnosis not present

## 2015-07-17 DIAGNOSIS — Z23 Encounter for immunization: Secondary | ICD-10-CM

## 2015-07-17 DIAGNOSIS — D500 Iron deficiency anemia secondary to blood loss (chronic): Secondary | ICD-10-CM | POA: Diagnosis not present

## 2015-07-17 DIAGNOSIS — K219 Gastro-esophageal reflux disease without esophagitis: Secondary | ICD-10-CM | POA: Diagnosis not present

## 2015-07-17 DIAGNOSIS — K5731 Diverticulosis of large intestine without perforation or abscess with bleeding: Secondary | ICD-10-CM

## 2015-07-17 DIAGNOSIS — E876 Hypokalemia: Secondary | ICD-10-CM

## 2015-07-17 DIAGNOSIS — D509 Iron deficiency anemia, unspecified: Secondary | ICD-10-CM

## 2015-07-17 LAB — BASIC METABOLIC PANEL
BUN: 10 mg/dL (ref 7–25)
CO2: 26 mmol/L (ref 20–31)
Calcium: 8.5 mg/dL — ABNORMAL LOW (ref 8.6–10.4)
Chloride: 109 mmol/L (ref 98–110)
Creat: 0.65 mg/dL (ref 0.50–1.05)
Glucose, Bld: 67 mg/dL (ref 65–99)
Potassium: 3.6 mmol/L (ref 3.5–5.3)
Sodium: 148 mmol/L — ABNORMAL HIGH (ref 135–146)

## 2015-07-17 LAB — POCT HEMOGLOBIN: Hemoglobin: 7.7 g/dL — AB (ref 12.2–16.2)

## 2015-07-17 MED ORDER — SUCRALFATE 1 GM/10ML PO SUSP: 1.0000 g | Freq: Three times a day (TID) | ORAL | Status: DC

## 2015-07-17 MED ORDER — FERROUS SULFATE 220 (44 FE) MG/5ML PO ELIX: 220.0000 mg | ORAL_SOLUTION | Freq: Two times a day (BID) | ORAL | Status: DC

## 2015-07-17 NOTE — Assessment & Plan Note (Signed)
Controlled on lisinopril 20 mg daily -No change in therapy today

## 2015-07-17 NOTE — Assessment & Plan Note (Signed)
Patient presents for hospital follow-up of anemia secondary to hemorrhage from diverticulosis. Hemoglobin currently 7.7, down from 9.1 at time of discharge -Patient encouraged to continue daily iron supplementation -As patient reports no further bleeding we'll continue to monitor clinically -Patient to follow-up with general surgery for discussion of possible colectomy on 07/23/2015

## 2015-07-17 NOTE — Progress Notes (Signed)
   Subjective:    Patient ID: [name], female    DOB: 1956-09-16, 59 y.o.   MRN: [id number]  HPI 59 year old female presents for hospital follow-up. Patient was admitted at [facility] from 07/05/2015 through 07/09/2015. She had severe anemia secondary to diverticular bleed. Hemoglobin on admission was 6.6. Patient was evaluated by GI. Workup included a CT abdomen and pelvis which showed no signs of ischemic colitis. An RBC scan was completed which did not detect a GI bleed. Patient received a total of 8 units of packed red blood cells during her hospitalization.  Diverticulosis- patient is scheduled to follow-up with general surgery on 07/23/2015 to discuss possible colectomy  Anemia-patient continues to be fatigued, she reports no further bleeding per rectum, she is taking iron supplementation twice daily  Hypokalemia - patient found to be hypokalemic during hospitalization, this was repleted, not currently on potassium supplementation  Hypertension - no chest pain, no dizziness, no vision changes, taking lisinopril 20 mg daily  GERD - patient reports symptoms are controlled when she is able take brand-name Nexium and Carafate when necessary, symptoms are not controlled with use of generic Nexium, patient reports that she is going to have her GI doctor send these medications to the pharmacy to see if they will be covered with prescribed by a specialist   Review of Systems  Constitutional: Negative for fever, chills and fatigue.  Respiratory: Negative for choking and shortness of breath.   Cardiovascular: Positive for leg swelling. Negative for chest pain.  Gastrointestinal: Negative for diarrhea, constipation and blood in stool.       Objective:   Physical Exam Vitals: Reviewed Gen.: Pleasant female, no acute distress Cardiac: Regular rate and rhythm, S1 and S2 present, no murmurs, no heaves or thrills Respiratory: Clear to patient bilaterally, normal effort Abdomen:  Soft, mild epigastric tenderness, no distention, normal bowel sounds, no rebound, no guarding Extremities: 2+ edema bilateral lower extremities to the midshin  Point-of-care hemoglobin is 7.7 (9.1 at time of discharge)     Assessment & Plan:  Please see problem specific assessment and plan.

## 2015-07-17 NOTE — Assessment & Plan Note (Signed)
Patient found to be hypokalemic during hospitalization. -Recheck BMP today

## 2015-07-17 NOTE — Assessment & Plan Note (Addendum)
Patient presents for hospital discharge for diverticulosis of the colon with hemorrhage -Patient reports no further bleeding -Scheduled for appointment with surgery on 07/23/2015 for discussion on possible colectomy -see section on anemia

## 2015-07-17 NOTE — Patient Instructions (Signed)
It was nice to see you today.  Anemia - your level is low today (hemoglobin 7.7), continue iron, please call me immediately if you have bleeding in your stool  Diverticulosis - please keep you appointment on 9/21 with the surgeon  Potassium - recheck labs today  Acid Reflux - continue Nexium and Carafate, please call me if you can not get the Nexium covered when sent in my the GI doctor  You will be given a flu and Tdap vaccine today.

## 2015-07-18 ENCOUNTER — Telehealth: Payer: Self-pay | Admitting: Family Medicine

## 2015-07-18 ENCOUNTER — Telehealth: Payer: Self-pay | Admitting: Gastroenterology

## 2015-07-18 NOTE — Telephone Encounter (Signed)
Spoke with the patient and scheduled her for 07/29/15 with Nicoletta Ba, PA. Patient will see [facility] on 07/23/15

## 2015-07-18 NOTE — Telephone Encounter (Signed)
Called patient and discussed normal potassium level.

## 2015-07-22 ENCOUNTER — Telehealth: Payer: Self-pay | Admitting: Family Medicine

## 2015-07-22 DIAGNOSIS — D509 Iron deficiency anemia, unspecified: Secondary | ICD-10-CM

## 2015-07-22 NOTE — Telephone Encounter (Signed)
Spoke with patient regarding leg swelling.  Patient stated that her legs and feet have been severely swollen since Friday 07/18/15.  Advised patient that she should be seen in clinic.  Offered an appointment for tomorrow at 10:15 AM, but declined has a surgery consult at 9:45.  Precept with Dr. Ree Kida; schedule an appointment for POCT hgb and to see him when patient arrives.  Patient agreed to be seen in lab at 8:45 AM.  Derl Barrow, RN

## 2015-07-22 NOTE — Telephone Encounter (Signed)
Legs and feet are swollen and pai Please advisenful [phone]

## 2015-07-23 ENCOUNTER — Other Ambulatory Visit (INDEPENDENT_AMBULATORY_CARE_PROVIDER_SITE_OTHER): Payer: Commercial Managed Care - HMO

## 2015-07-23 ENCOUNTER — Other Ambulatory Visit: Payer: Self-pay | Admitting: Family Medicine

## 2015-07-23 DIAGNOSIS — D509 Iron deficiency anemia, unspecified: Secondary | ICD-10-CM | POA: Diagnosis not present

## 2015-07-23 DIAGNOSIS — K922 Gastrointestinal hemorrhage, unspecified: Secondary | ICD-10-CM | POA: Diagnosis not present

## 2015-07-23 LAB — POCT HEMOGLOBIN: Hemoglobin: 8.9 g/dL — AB (ref 12.2–16.2)

## 2015-07-23 MED ORDER — FUROSEMIDE 20 MG PO TABS: 20.0000 mg | ORAL_TABLET | Freq: Every day | ORAL | Status: DC

## 2015-07-23 NOTE — Progress Notes (Signed)
Patient seen for lab visit, hbg up to 8.9, patient still having swelling in legs, attempt 3 day trial of lasix.

## 2015-07-25 ENCOUNTER — Other Ambulatory Visit: Payer: Self-pay | Admitting: Family Medicine

## 2015-07-25 MED ORDER — FUROSEMIDE 20 MG PO TABS: 20.0000 mg | ORAL_TABLET | Freq: Every day | ORAL | Status: DC | PRN

## 2015-07-25 NOTE — Telephone Encounter (Signed)
Swelling improved with lasix, wearing compression socks, will send in lasix to use prn

## 2015-07-25 NOTE — Telephone Encounter (Signed)
Want to get a full rx for the furosemide to [facility].  Also surgeon told her that he wasn't going to perform the surgery.

## 2015-07-26 DIAGNOSIS — Z23 Encounter for immunization: Secondary | ICD-10-CM | POA: Diagnosis not present

## 2015-07-29 ENCOUNTER — Ambulatory Visit (INDEPENDENT_AMBULATORY_CARE_PROVIDER_SITE_OTHER): Payer: Commercial Managed Care - HMO | Admitting: Physician Assistant

## 2015-07-29 ENCOUNTER — Encounter: Payer: Self-pay | Admitting: Physician Assistant

## 2015-07-29 ENCOUNTER — Other Ambulatory Visit (INDEPENDENT_AMBULATORY_CARE_PROVIDER_SITE_OTHER): Payer: Commercial Managed Care - HMO

## 2015-07-29 VITALS — BP 150/80 | HR 83 | Ht 64.0 in | Wt 195.0 lb

## 2015-07-29 DIAGNOSIS — Z8719 Personal history of other diseases of the digestive system: Secondary | ICD-10-CM

## 2015-07-29 DIAGNOSIS — D500 Iron deficiency anemia secondary to blood loss (chronic): Secondary | ICD-10-CM

## 2015-07-29 LAB — CBC WITH DIFFERENTIAL/PLATELET
Basophils Absolute: 0 10*3/uL (ref 0.0–0.1)
Basophils Relative: 0.2 % (ref 0.0–3.0)
Eosinophils Absolute: 0.3 10*3/uL (ref 0.0–0.7)
Eosinophils Relative: 4.8 % (ref 0.0–5.0)
HCT: 31 % — ABNORMAL LOW (ref 36.0–46.0)
Hemoglobin: 10.1 g/dL — ABNORMAL LOW (ref 12.0–15.0)
Lymphocytes Relative: 31.7 % (ref 12.0–46.0)
Lymphs Abs: 1.8 10*3/uL (ref 0.7–4.0)
MCHC: 32.5 g/dL (ref 30.0–36.0)
MCV: 88.7 fl (ref 78.0–100.0)
Monocytes Absolute: 0.5 10*3/uL (ref 0.1–1.0)
Monocytes Relative: 8.3 % (ref 3.0–12.0)
Neutro Abs: 3 10*3/uL (ref 1.4–7.7)
Neutrophils Relative %: 55 % (ref 43.0–77.0)
Platelets: 328 10*3/uL (ref 150.0–400.0)
RBC: 3.49 Mil/uL — ABNORMAL LOW (ref 3.87–5.11)
RDW: 21 % — ABNORMAL HIGH (ref 11.5–15.5)
WBC: 5.5 10*3/uL (ref 4.0–10.5)

## 2015-07-29 MED ORDER — ESOMEPRAZOLE MAGNESIUM 40 MG PO CPDR: DELAYED_RELEASE_CAPSULE | ORAL | Status: DC

## 2015-07-29 NOTE — Progress Notes (Signed)
Patient ID: [name], female   DOB: September 25, 1956, 59 y.o.   MRN: [id number]   Subjective:    Patient ID: [name], female    DOB: 11-11-1955, 59 y.o.   MRN: [id number]  HPI [name] is a pleasant 59 year old African-American female known to Dr. Deatra Ina who has history of recurrent diverticular bleeding. She has had at least 8 prior admissions over the past several years for diverticular bleeding and four admissions thus far this year. She has required transfusions with most of these admissions. Her most recent admission was September 3 through September 7. She did have a surgical consultation during that admission and she has known pan diverticulosis. She was seen by Dr. Georganna Skeans and then scheduled for a follow-up appointment with Dr. Leighton Ruff to consider a laparoscopic subtotal colectomy.  She comes in today stating that she has been doing well since discharge from the hospital with no evidence of any recurrent bleeding. Her bowel movements are normal. She has no abdominal pain or cramping. She continues to feel fatigued and occasionally feels a little bit lightheaded. Her hemoglobin on 07/17/2015 was 7.7. She is on oral iron. She has been on a baby aspirin which she was placed back on because of history of TIA. Patient has been to see Dr. Elmo Putt Thomas/surgery who stated that she did not want to operate on her . Patient at this time is very interested in having surgical options and asks for a second opinion. She had undergone an EGD in August 2016 with a superficial linear ulceration noted at her anastomosis and a benign fundic gland polyp. Last colonoscopy was done in January 2016 per Dr. Deatra Ina with finding of moderate diffuse diverticulosis through the descending colon.  Review of Systems Pertinent positive and negative review of systems were noted in the above HPI section.  All other review of systems was otherwise negative.  Outpatient Encounter Prescriptions as of 07/29/2015    Medication Sig  . atorvastatin (LIPITOR) 40 MG tablet Take 1 tablet (40 mg total) by mouth daily.  . calcium carbonate (OS-CAL) 600 MG TABS tablet Take 1 tablet (600 mg total) by mouth 2 (two) times daily with a meal.  . Cetirizine HCl (ZYRTEC ALLERGY) 10 MG CAPS Take 1 capsule (10 mg total) by mouth daily. (Patient taking differently: Take 1 capsule by mouth at bedtime. )  . docusate sodium (COLACE) 100 MG capsule Take 1 capsule (100 mg total) by mouth 2 (two) times daily as needed. For constipation (Patient taking differently: Take 100-200 mg by mouth 2 (two) times daily. For constipation, take 200 mg in the am and 100 mg in the evening)  . esomeprazole (NEXIUM) 40 MG capsule Take 1 tab every morning.  . ferrous sulfate 220 (44 FE) MG/5ML solution Take 5 mLs (220 mg total) by mouth 2 (two) times daily with a meal.  . fluticasone (FLONASE) 50 MCG/ACT nasal spray Place 2 sprays into both nostrils at bedtime.  . furosemide (LASIX) 20 MG tablet Take 1 tablet (20 mg total) by mouth daily as needed.  Marland Kitchen lisinopril (PRINIVIL,ZESTRIL) 20 MG tablet TAKE ONE TABLET BY MOUTH ONCE DAILY  . Multiple Vitamin (MULTIVITAMIN) tablet Take 1 tablet by mouth daily.  . polyethylene glycol powder (GLYCOLAX/MIRALAX) powder TAKE 17G BY MOUTH TWICE DAILY  . sucralfate (CARAFATE) 1 GM/10ML suspension Take 10 mLs (1 g total) by mouth 4 (four) times daily -  with meals and at bedtime.  . Vitamins A & D 5000-400 UNITS CAPS Take  1 tablet by mouth daily.  Marland Kitchen zolpidem (AMBIEN) 10 MG tablet TAKE ONE TABLET AT BEDTIME AS NEEDED FOR SLEEP  . [DISCONTINUED] esomeprazole (NEXIUM) 40 MG capsule Take 1 capsule (40 mg total) by mouth 2 (two) times daily before a meal.   No facility-administered encounter medications on file as of 07/29/2015.   Allergies  Allergen Reactions  . Aspirin Other (See Comments)    GI BLEED  . Nsaids Other (See Comments)    GI BLEED   Patient Active Problem List   Diagnosis Date Noted  .  Diverticulitis of colon with bleeding   . Abdominal pain   . Severe anemia   . Tension type headache 05/26/2015  . Muscle pain 05/26/2015  . GI bleed 05/12/2015  . Chest pain 05/12/2015  . Acute blood loss anemia   . Lower GI bleed   . Hypokalemia 03/18/2015  . Lower GI bleeding   . Diverticulosis of colon without hemorrhage 11/18/2014  . Blood in stool 10/07/2014  . Pain in joint, shoulder region 07/25/2014  . Muscle cramps 04/22/2014  . Biliary sludge 04/08/2014  . Bunion 03/28/2014  . Foot pain, bilateral 01/11/2014  . Constipation 10/19/2013  . Subacromial bursitis 05/21/2013  . Eczema 05/21/2013  . External hemorrhoids without complication 14/48/1856  . Insomnia 08/29/2012  . GERD (gastroesophageal reflux disease) 08/24/2012  . S/P gastric bypass 06/07/2012  . Asthma, cough variant 03/10/2012  . Diverticulosis of colon with hemorrhage   . Hypopigmented skin lesion 02/23/2011  . 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 Allergic rhinitis 05/27/2010  . POSTMENOPAUSAL STATUS 04/07/2010  . LIBIDO, DECREASED 10/16/2009  . H/O type 2 diabetes mellitus 01/25/2008  . CYST, KIDNEY, ACQUIRED 08/15/2007  . CEREBROVASCULAR ACCIDENT, HX OF 07/26/2007  . HTN, goal below 140/90 05/12/2007  . OBESITY, NOS 12/29/2006  . Iron deficiency anemia 12/29/2006  . Venous (peripheral) insufficiency 12/29/2006   Social History   Social History  . Marital Status: Divorced    Spouse Name: N/A  . Number of Children: 1  . 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 Smoking status: Never Smoker   . Smokeless tobacco: Never Used  . Alcohol Use: 0.0 oz/week    0 Standard drinks or equivalent per week     Comment: occasional wine  . Drug Use: No  . Sexual Activity: Not on file   Other Topics Concern  . Not on file   Social History Narrative    [name]'s family history includes Cancer (age of onset: 35) in her father; Colon  cancer in her father; Diabetes in her brother, brother, brother, brother, mother, sister, and sister; Heart disease in her sister; Hypertension in her mother; Stroke in her brother and sister.      Objective:    Filed Vitals:   07/29/15 1447  BP: 150/80  Pulse: 83    Physical Exam  well-developed African-American female in no acute distress, accompanied by her husband, pleasant blood pressure 150/80 pulse 83 height 5 foot 4 weight 195. HEENT ;nontraumatic normocephalic EOMI PERRLA sclera anicteric, Supple; no JVD, Cardiovascular ;regular rate and rhythm with S1-S2 no murmur or gallop, Pulmonary; clear bilaterally, Abdomen ;soft nontender nondistended bowel sounds are active there is no palpable mass or hepatosplenomegaly bowel sounds are present, Rectal ;exam not done, Ext;no clubbing cyanosis or edema skin warm dry, Neuropsych ;mood and affect appropriate       Assessment & Plan:   #  1 59 yo female with recurrent diverticular bleeding with multiple hospitalizations for diverticular bleeds(4 this year). Pt with know pan diverticulosis Recent hospitalization 2 weeks ago for same #2 anemia secondary to blood loss #3 s/p gastric bypass  Plan; repeat cbc today Will arrange for surgical consult at [facility]- Dr Morton Stall  For consideration of subtotal  colectomy  Amy Genia Harold PA-C 07/29/2015   Cc: [name], MD

## 2015-07-29 NOTE — Patient Instructions (Signed)
Please go to the basement level to have your labs drawn.  We sent a prescription to [facility]. Nexium 40 mg.  They will fax [place] if this needs a prior authorization.  We have given you samples. Take 2 capsules daily. ( They are only 22.3 mg ).

## 2015-07-30 ENCOUNTER — Telehealth: Payer: Self-pay

## 2015-07-30 ENCOUNTER — Other Ambulatory Visit: Payer: Self-pay | Admitting: Family Medicine

## 2015-07-30 DIAGNOSIS — K5791 Diverticulosis of intestine, part unspecified, without perforation or abscess with bleeding: Secondary | ICD-10-CM

## 2015-07-30 NOTE — Telephone Encounter (Signed)
-----   Message from Alfredia Ferguson, PA-C sent at 07/29/2015  4:38 PM EDT ----- Please let pt know her hgb is much better at 10.1 ... Still anemic but much better than 7!... Let her know we will call her about surgical referral to [facility]... And please initiate referral to Dr Marya Amsler Waters/colorectal surgery  At [facility] for diffuse diverticulosis and recurrent diverticular bleeding with 4 hospitalizations this year  So far with diverticular hemorrhage

## 2015-07-30 NOTE — Telephone Encounter (Signed)
Ok great thank you.

## 2015-07-30 NOTE — Telephone Encounter (Signed)
Patient nottifed. Appointment with Dr Lenise Arena 08/05/15 at [facility] [place]. [address] [place]. [phone] for recurrent diverticular bleeding and diffuse diverticulosis.

## 2015-07-31 ENCOUNTER — Telehealth: Payer: Self-pay | Admitting: *Deleted

## 2015-07-31 ENCOUNTER — Telehealth: Payer: Self-pay | Admitting: Family Medicine

## 2015-07-31 NOTE — Telephone Encounter (Signed)
I called the pharmacist at [name] and told them the Nexium 40 mg is supposed to be approved as of 08-04-2015.  Patient just picked up a script for generic Nexium on 07-12-2015.  That is why she cannot get the Nexium until 08-04-2015. Case # [id number].

## 2015-07-31 NOTE — Telephone Encounter (Signed)
Pt called and would like to speak to one of Dr. Nedra Hai nurse about her bleeding. She was told by Dr. Ree Kida if this came back to call and speak to the nurse.Marland Kitchen jw

## 2015-07-31 NOTE — Telephone Encounter (Signed)
Patient called stating she started having rectal bleeding yesterday around 5 AM and lasted for about hour.  Patient is not very tired, sleepy and abdomen is tender.  Patient stated that PCP told her to call if she started bleeding again.  PCP is out of the office; Dr. Ardelia Mems is the provider covering.  Per Dr. Ardelia Mems patient should be seen in the ED for further evaluation.  Patient verbalized understanding and patient to follow up with PCP.  Derl Barrow, RN

## 2015-07-31 NOTE — Telephone Encounter (Signed)
Advised the patient I spent a lot of time today doing a prior authorizaiton on Nexium 40 mg.  [employer] rep said this will be approved but she cannot get it until 08-04-2015. She just picked up a prescription on 07-12-2015.  The pharmacist at [facility] said she isn't sure it will be covered on 08-04-2015.  I told the patient to call the phamary and have them run it and see if the brand name Nexium 40 mg will be covered. If not the patient may have to take the generic 40 mg. The pharmacist said that is only $ 2.50.

## 2015-08-05 DIAGNOSIS — K922 Gastrointestinal hemorrhage, unspecified: Secondary | ICD-10-CM | POA: Diagnosis not present

## 2015-08-11 NOTE — Progress Notes (Signed)
Reviewed and agree with management. Robert D. Kaplan, M.D., FACG  

## 2015-08-19 ENCOUNTER — Telehealth: Payer: Self-pay

## 2015-08-19 DIAGNOSIS — K922 Gastrointestinal hemorrhage, unspecified: Secondary | ICD-10-CM | POA: Diagnosis not present

## 2015-08-19 NOTE — Telephone Encounter (Signed)
-----   Message from Ladene Artist, MD sent at 08/19/2015  3:50 PM EDT ----- Dr. Morton Stall, [facility], wants a CE scheduled on this [place] patient to complete the GI work up of recurrent GI bleed. Please get the patient assigned to SA or VN and set up for a CE ASAP. AE saw patient recently in the office.

## 2015-08-19 NOTE — Telephone Encounter (Signed)
Patient contacted she will come for a nurse visit on 08/21/15 to be set up for the capsule.

## 2015-08-20 ENCOUNTER — Ambulatory Visit: Payer: Commercial Managed Care - HMO | Admitting: Podiatry

## 2015-08-21 ENCOUNTER — Other Ambulatory Visit: Payer: Self-pay

## 2015-08-21 DIAGNOSIS — D62 Acute posthemorrhagic anemia: Secondary | ICD-10-CM

## 2015-08-25 ENCOUNTER — Other Ambulatory Visit: Payer: Self-pay | Admitting: Family Medicine

## 2015-08-28 ENCOUNTER — Ambulatory Visit (INDEPENDENT_AMBULATORY_CARE_PROVIDER_SITE_OTHER): Payer: Commercial Managed Care - HMO | Admitting: Gastroenterology

## 2015-08-28 DIAGNOSIS — K921 Melena: Secondary | ICD-10-CM

## 2015-08-28 NOTE — Progress Notes (Signed)
Patient here for capsule endoscopy. She completed the prep and has not eaten. Tolerated procedure. Verbalizes understanding of written and verbal instructions. Capsule  Lot [id number] 10 Expires 2017-11

## 2015-09-04 ENCOUNTER — Telehealth: Payer: Self-pay | Admitting: *Deleted

## 2015-09-04 ENCOUNTER — Ambulatory Visit: Payer: Commercial Managed Care - HMO | Admitting: Family Medicine

## 2015-09-04 ENCOUNTER — Other Ambulatory Visit: Payer: Self-pay

## 2015-09-04 DIAGNOSIS — T189XXA Foreign body of alimentary tract, part unspecified, initial encounter: Secondary | ICD-10-CM

## 2015-09-04 NOTE — Telephone Encounter (Signed)
-----   Message from Hulan Saas, RN sent at 08/28/2015  4:44 PM EDT ----- Did patient pass capsule?

## 2015-09-04 NOTE — Telephone Encounter (Signed)
Spoke with patient and she did not pass her capsule. She has already talked with Eustaquio Maize and will come for xray on Friday.

## 2015-09-05 ENCOUNTER — Ambulatory Visit (INDEPENDENT_AMBULATORY_CARE_PROVIDER_SITE_OTHER)
Admission: RE | Admit: 2015-09-05 | Discharge: 2015-09-05 | Disposition: A | Discharge status: Home / Self Care | Payer: Commercial Managed Care - HMO | Source: Ambulatory Visit | Attending: Physician Assistant | Admitting: Physician Assistant

## 2015-09-05 ENCOUNTER — Encounter: Payer: Self-pay | Admitting: Gastroenterology

## 2015-09-05 DIAGNOSIS — T189XXA Foreign body of alimentary tract, part unspecified, initial encounter: Secondary | ICD-10-CM | POA: Diagnosis not present

## 2015-09-05 DIAGNOSIS — Z0389 Encounter for observation for other suspected diseases and conditions ruled out: Secondary | ICD-10-CM | POA: Diagnosis not present

## 2015-09-16 DIAGNOSIS — K922 Gastrointestinal hemorrhage, unspecified: Secondary | ICD-10-CM | POA: Diagnosis not present

## 2015-09-17 ENCOUNTER — Ambulatory Visit (INDEPENDENT_AMBULATORY_CARE_PROVIDER_SITE_OTHER): Payer: Commercial Managed Care - HMO | Admitting: Podiatry

## 2015-09-17 ENCOUNTER — Encounter: Payer: Self-pay | Admitting: Podiatry

## 2015-09-17 DIAGNOSIS — M79675 Pain in left toe(s): Secondary | ICD-10-CM | POA: Diagnosis not present

## 2015-09-17 DIAGNOSIS — B351 Tinea unguium: Secondary | ICD-10-CM | POA: Diagnosis not present

## 2015-09-17 DIAGNOSIS — M79674 Pain in right toe(s): Secondary | ICD-10-CM | POA: Diagnosis not present

## 2015-09-17 NOTE — Progress Notes (Signed)
Patient ID: [name], female   DOB: September 29, 1956, 59 y.o.   MRN: [id number]  Subjective: This patient presents for scheduled visit complaining of painful toenails walking wearing shoes and request nail debridement  Objective: Orientated 3 No open skin lesions lesions bilaterally Hypertrophic, incurvated, discolored, deformed toenails 1-5 left and 1, 3, 4 right  Assessment: Symptomatic onychomycoses 8: History of diabetes  Plan: Debridement toenails 8 mechanically and electrically without a bleeding  Reappoint 3 months

## 2015-09-18 ENCOUNTER — Ambulatory Visit (INDEPENDENT_AMBULATORY_CARE_PROVIDER_SITE_OTHER): Payer: Commercial Managed Care - HMO | Admitting: Family Medicine

## 2015-09-18 ENCOUNTER — Ambulatory Visit (HOSPITAL_COMMUNITY)
Admission: RE | Admit: 2015-09-18 | Discharge: 2015-09-18 | Disposition: A | Discharge status: Home / Self Care | Payer: Commercial Managed Care - HMO | Source: Ambulatory Visit | Attending: Family Medicine | Admitting: Family Medicine

## 2015-09-18 ENCOUNTER — Encounter: Payer: Self-pay | Admitting: Family Medicine

## 2015-09-18 VITALS — BP 133/91 | HR 85 | Temp 97.9°F | Wt 193.0 lb

## 2015-09-18 DIAGNOSIS — R350 Frequency of micturition: Secondary | ICD-10-CM | POA: Diagnosis not present

## 2015-09-18 DIAGNOSIS — R140 Abdominal distension (gaseous): Secondary | ICD-10-CM | POA: Diagnosis not present

## 2015-09-18 DIAGNOSIS — R109 Unspecified abdominal pain: Secondary | ICD-10-CM | POA: Diagnosis not present

## 2015-09-18 LAB — POCT URINALYSIS DIPSTICK
Blood, UA: NEGATIVE
Glucose, UA: NEGATIVE
Ketones, UA: 15
Leukocytes, UA: NEGATIVE
Nitrite, UA: NEGATIVE
Protein, UA: 30
Spec Grav, UA: >=1.03
Urobilinogen, UA: 0.2
pH, UA: 5.5

## 2015-09-18 LAB — POCT UA - MICROSCOPIC ONLY

## 2015-09-18 MED ORDER — ZOLPIDEM TARTRATE 10 MG PO TABS: ORAL_TABLET | ORAL | Status: DC

## 2015-09-18 NOTE — Progress Notes (Signed)
   Subjective:    Patient ID: [name], female    DOB: 1955-11-22, 59 y.o.   MRN: [id number]  HPI 59 year old female presents for evaluation of abdominal bloating and frequent urination.  Abdominal bloating - one month history of epigastric bloating, now associated with epigastric pain, nausea is present, no emesis, tolerating diet, having regular BMs, small amount of loose stools over the past few days, no fevers, no chills  Urinary frequency - patient reports urinating every 10-15 minutes over the past few weeks, no associated burning, no suprapubic tenderness  Social-nonsmoker  Surgery-status post Roux-en-Y gastric bypass in 2013   Review of Systems  Constitutional: Negative for fever, chills and fatigue.  Respiratory: Negative for cough and shortness of breath.   Gastrointestinal: Positive for nausea and diarrhea. Negative for vomiting.       Objective:   Physical Exam Vitals: Reviewed Gen.: Pleasant African-American female, no acute distress Cardiac: Regular in rhythm, S1 and S2 present, no murmurs, no heaves or thrills Respiratory: Clear to auscultation bilaterally, normal effort Abdomen: Soft, bloating present especially in the epigastric region, mild suprapubic tenderness, moderate epigastric tenderness, no rebound, no guarding, no peritoneal symptoms  Point-of-care UA negative for leukocyte esterase or nitrate, positive for protein, microscopy pending     Assessment & Plan:  Abdominal bloating Patient presents for evaluation of epigastric abdominal bloating and pain. Clinically I and concern for possible ileus or small bowel obstruction especially given history of Roux-en-Y gastric bypass. Patient is stable and does not require immediate hospitalization. -Abdominal films to be obtained today  Urinary frequency Patient presents for evaluation of urinary frequency. No other signs or symptoms of UTI. UA negative for leukocyte esterase and nitrate. -Symptoms may be  related to abdominal bloating causing pressure on the bladder -At this time we'll monitor clinically as we're waiting further workup of abdominal bloating

## 2015-09-18 NOTE — Assessment & Plan Note (Signed)
Patient presents for evaluation of epigastric abdominal bloating and pain. Clinically I and concern for possible ileus or small bowel obstruction especially given history of Roux-en-Y gastric bypass. Patient is stable and does not require immediate hospitalization. -Abdominal films to be obtained today

## 2015-09-18 NOTE — Assessment & Plan Note (Signed)
Patient presents for evaluation of urinary frequency. No other signs or symptoms of UTI. UA negative for leukocyte esterase and nitrate. -Symptoms may be related to abdominal bloating causing pressure on the bladder -At this time we'll monitor clinically as we're waiting further workup of abdominal bloating

## 2015-09-18 NOTE — Addendum Note (Signed)
Addended by: Maryland Pink on: 09/18/2015 03:29 PM   Modules accepted: Orders

## 2015-09-18 NOTE — Patient Instructions (Signed)
It was nice to see you today.  Abdominal bloating - this may be related to your previous surgery, check xray today  Urinating frequently - no evidence of infection, may be related to abdominal bloating.

## 2015-09-19 ENCOUNTER — Telehealth: Payer: Self-pay | Admitting: Family Medicine

## 2015-09-19 NOTE — Telephone Encounter (Signed)
Contacted patient and informed her of negative KUB, she is to call her surgeon at [facility] to schedule appointement sooner than next month(previously scheduled).

## 2015-09-22 ENCOUNTER — Other Ambulatory Visit: Payer: Self-pay | Admitting: Family Medicine

## 2015-10-13 ENCOUNTER — Other Ambulatory Visit: Payer: Self-pay

## 2015-10-13 DIAGNOSIS — E1310 Other specified diabetes mellitus with ketoacidosis without coma: Secondary | ICD-10-CM

## 2015-10-13 NOTE — Patient Outreach (Signed)
[facility]) Care Management  10/13/2015  Mary Sella NOURAH CAULKINS Mar 08, 1956 [id number]  Telephone Screen   Providers: Primary MD: [name], MD -  last appt: 09/18/15     Gastroenterology:  Inda Castle, MD HH: None  Insurance: [employer]   Social: Mobility: Divorced and lives in her home; helps with the care of 3 grandchildren.   Falls: None  Pain: yes - unable to elaborate due to grandchildren are present and does not wish to discuss at this time.  Transportation: self  Caregiver: friend if needed Emergency Contact: Starrla Metoxen, son [phone] AD: no Resources: DME:   THN conditions:  Diabetes H/o history of Roux-en-Y gastric bypass 2013 and GI Bleeds Admissions: 2 > 6 months ER visits: 1 > 6 months Surgery planned for next month.  Weight:  193 Height: 64 inches  BP 133/91  Medications:  Less than 10  Co-pay cost issue:  No  Flu Vaccine 07/17/15 PC13  unknown  Consent: Patient agreed to [facility] services.   Plan:  [facility] Telephonic RN CM Referral  -Diabetes -IP Surgical Admission scheduled  RN CM notified [place] Management Assistant: agreed to services/case opened.  RN CM sent successful outreach letter, [facility] Introductory package and consent for service form.   RN CM instructed will schedule for next contact for Initial Assessment within 30 days.  RN CM advised to please notify MD of any changes in condition prior to scheduled appt's.   RN CM provided contact name and # [phone] or main office # [phone] and 24-hour nurse line # [phone].  RN CM confirmed patient is aware of 911 services for urgent emergency needs.  Mariann Laster, RN, BSN, [facility], CCM  [employer] Management Coordinator [phone] Direct [phone] Cell [phone] Office [phone] Fax

## 2015-10-16 DIAGNOSIS — Z9884 Bariatric surgery status: Secondary | ICD-10-CM | POA: Diagnosis not present

## 2015-10-20 ENCOUNTER — Telehealth: Payer: Self-pay | Admitting: Family Medicine

## 2015-10-20 NOTE — Telephone Encounter (Signed)
Pt returned dr Viann Shove call

## 2015-10-20 NOTE — Telephone Encounter (Signed)
Pt called and would like for Dr. Ree Kida to return her call at the earliest convenience. Patient would like to speak to him about cold symptoms that she has been having. Pt attempted to make an appointment with only Dr. Ree Kida, however, nothing is available until 11/04/2014. Thank you, Fonda Kinder, ASA

## 2015-10-20 NOTE — Telephone Encounter (Signed)
Attempted to contact patient. No answer.  RN staff - please contact patient this afternoon and address her concerns please. Thanks. I will be precepting if you have questions.   Dr. Wanda Plump.

## 2015-10-21 MED ORDER — BENZONATATE 100 MG PO CAPS: 100.0000 mg | ORAL_CAPSULE | Freq: Two times a day (BID) | ORAL | Status: DC | PRN

## 2015-10-21 NOTE — Telephone Encounter (Signed)
Returned patient call. 1 1/2 weeks of nasal congestion and cough (productive of sputum). No fevers. No sob. Has been using otc Mucinex, Halls, and DayQuil. Patient requesting something to help her with symptoms. Counseled that symptoms likely viral in nature. If develops fevers will need to be seen in the office. Attempt trial of Mucinex DM, Tessalon, and Netty Pot.

## 2015-10-21 NOTE — Telephone Encounter (Signed)
Pt called again

## 2015-10-22 ENCOUNTER — Other Ambulatory Visit: Payer: Self-pay

## 2015-10-22 NOTE — Patient Outreach (Signed)
[facility] Care Management  10/22/2015  [name] Jan 18, 1956 [id number]   Outreach call #1 for Initial Assessment. Patient not reached.  RN CM left HIPAA compliant voice message with name and number.  RN CM will schedule for next outreach call within one week.   Mariann Laster, RN, BSN, [facility], CCM  [employer] Management Coordinator [phone] Direct [phone] Cell [phone] Office [phone] Fax

## 2015-10-29 ENCOUNTER — Other Ambulatory Visit: Payer: Self-pay

## 2015-10-29 NOTE — Patient Outreach (Signed)
[facility]) Care Management  10/29/2015  [name] April 03, 1956 [id number]   Outreach call #2 for Initial Assessment.  Patient not reached.  RN CM left HIPAA compliant voice message with name and number.  RN CM will schedule for next outreach call within one week.   Hannah Laster, RN, BSN, [facility], CCM  [employer] Management Coordinator [phone] Direct [phone] Cell [phone] Office [phone] Fax

## 2015-10-30 ENCOUNTER — Ambulatory Visit: Payer: Self-pay

## 2015-11-06 ENCOUNTER — Ambulatory Visit: Payer: Self-pay

## 2015-11-07 ENCOUNTER — Other Ambulatory Visit: Payer: Self-pay

## 2015-11-07 NOTE — Patient Outreach (Signed)
[facility]) Care Management  11/07/2015  [name] Mar 27, 1956 [id number]   Outreach call #3 for Initial Assessment.  Patient not reached.  RN CM left HIPAA compliant voice message with name and number.  RN CM sent unsuccessful outreach letter to patient.  RN CM will review for response in two weeks.    Mariann Laster, RN, BSN, [facility], CCM  [employer] Management Coordinator [phone] Direct [phone] Cell [phone] Office [phone] Fax

## 2015-11-13 DIAGNOSIS — K5733 Diverticulitis of large intestine without perforation or abscess with bleeding: Secondary | ICD-10-CM | POA: Diagnosis not present

## 2015-11-19 ENCOUNTER — Other Ambulatory Visit: Payer: Self-pay | Admitting: Family Medicine

## 2015-11-21 ENCOUNTER — Other Ambulatory Visit: Payer: Self-pay

## 2015-11-21 DIAGNOSIS — E0800 Diabetes mellitus due to underlying condition with hyperosmolarity without nonketotic hyperglycemic-hyperosmolar coma (NKHHC): Secondary | ICD-10-CM

## 2015-11-21 NOTE — Patient Outreach (Addendum)
[facility]) Care Management  11/21/2015  [name] October 16, 1956 [id number]   Initial Assessment    Providers: Primary MD: [name], MD - last appt: 11/17/16and 10/21/15 for URI Gastroenterology: Inda Castle, MD HH: None  Insurance: [employer]   Social: Mobility: Divorced and lives in her home; helps with the care of 3 grandchildren.  Falls: None  Pain: yes - unable to elaborate due to grandchildren are present and does not wish to discuss at this time.  Transportation: self.  Post hospital discharge plan:  Fiance: [name] if in town or other family or friends if available.  Caregiver: friend if needed Emergency Contact: Kandra Dishong, son [phone] Advanced Directive: none.  States she only has one son and would like Mircle Fedrick to be the person added to an Scientist, physiological.  Patient agreed to mailing of document.   Resources:  -[place]; states did not qualify for food stamps.   DME: reading glasses, CBG meter.  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  Surgery scheduled for 11/24/2015 at [facility] to "remove my colon."  States h/o rectal bleeding for 10 years.  Admissions: 1 > 6 months ER visits: 0 > 6 months  Weight: 185 down from 193 reported on 10/13/2015. Height: 64 inches  BP 133/91 States BS readings remain stable during this time as well as labs.   Medications:  Less than 10  Co-pay cost issue: No  Flu Vaccine 07/17/15 PC13 unknown  Preventives: Last eye exam about 2 1/2 years ago.  Patient never followed through with glasses due to cost.  Patient uses reading glasses and is able to read medication bottle labels with readers.   Consent: Patient agreed to [facility] services.   Plan:  [facility] Telephonic RN CM Referral  -Diabetes -IP Surgical Admission 11/23/2014 RN CM will refer patient to [place] RN CM services for [facility] care at discharge.    Advanced Directives:  RN CM provided verbal education on importance of document and mailed copy to patient 11/21/2015. RN CM advised that RN CM can provide direction on completion.  Advised if received during IP admission; have family take to hospital and RN CM can assist via telephone contact call while IP.   [facility] SW Referral -resource for eye glasses   RN CM sent successful outreach letter, [facility] Introductory package and consent for service form 10/13/2015 but patient states she did not receive.   RN CM mailed again 11/21/2015.   RN CM instructed will schedule for next contact for Initial Assessment within 30 days. RN CM advised patient may have IP CM contact RN CM to assist with discharge planning needs and care coordination of services.  RN CM sent MD Barriers letter and Initial Assessment  RN CM advised to please notify MD of any changes in condition prior to scheduled appt's.  RN CM provided contact name and # [phone] or main office # [phone] and 24-hour nurse line # [phone].  RN CM confirmed patient is aware of 911 services for urgent emergency needs.  Mariann Laster, RN, BSN, [facility], CCM  [employer] Management Coordinator [phone] Direct [phone] Cell [phone] Office [phone] Fax

## 2015-11-24 ENCOUNTER — Encounter: Payer: Self-pay | Admitting: *Deleted

## 2015-11-24 ENCOUNTER — Other Ambulatory Visit: Payer: Self-pay | Admitting: *Deleted

## 2015-11-24 DIAGNOSIS — K91870 Postprocedural hematoma of a digestive system organ or structure following a digestive system procedure: Secondary | ICD-10-CM | POA: Diagnosis not present

## 2015-11-24 DIAGNOSIS — K922 Gastrointestinal hemorrhage, unspecified: Secondary | ICD-10-CM | POA: Diagnosis not present

## 2015-11-24 DIAGNOSIS — I10 Essential (primary) hypertension: Secondary | ICD-10-CM | POA: Diagnosis not present

## 2015-11-24 DIAGNOSIS — E785 Hyperlipidemia, unspecified: Secondary | ICD-10-CM | POA: Diagnosis not present

## 2015-11-24 DIAGNOSIS — R000 Tachycardia, unspecified: Secondary | ICD-10-CM | POA: Diagnosis not present

## 2015-11-24 DIAGNOSIS — Z79899 Other long term (current) drug therapy: Secondary | ICD-10-CM | POA: Diagnosis not present

## 2015-11-24 DIAGNOSIS — Z8639 Personal history of other endocrine, nutritional and metabolic disease: Secondary | ICD-10-CM | POA: Diagnosis not present

## 2015-11-24 DIAGNOSIS — Z4659 Encounter for fitting and adjustment of other gastrointestinal appliance and device: Secondary | ICD-10-CM | POA: Diagnosis not present

## 2015-11-24 DIAGNOSIS — N99841 Postprocedural hematoma of a genitourinary system organ or structure following other procedure: Secondary | ICD-10-CM | POA: Diagnosis not present

## 2015-11-24 DIAGNOSIS — K567 Ileus, unspecified: Secondary | ICD-10-CM | POA: Diagnosis not present

## 2015-11-24 DIAGNOSIS — Z9884 Bariatric surgery status: Secondary | ICD-10-CM | POA: Diagnosis not present

## 2015-11-24 DIAGNOSIS — K651 Peritoneal abscess: Secondary | ICD-10-CM | POA: Diagnosis not present

## 2015-11-24 DIAGNOSIS — K469 Unspecified abdominal hernia without obstruction or gangrene: Secondary | ICD-10-CM | POA: Diagnosis not present

## 2015-11-24 DIAGNOSIS — R188 Other ascites: Secondary | ICD-10-CM | POA: Diagnosis not present

## 2015-11-24 DIAGNOSIS — R140 Abdominal distension (gaseous): Secondary | ICD-10-CM | POA: Diagnosis not present

## 2015-11-24 DIAGNOSIS — K5731 Diverticulosis of large intestine without perforation or abscess with bleeding: Secondary | ICD-10-CM | POA: Diagnosis not present

## 2015-11-24 DIAGNOSIS — Z781 Physical restraint status: Secondary | ICD-10-CM | POA: Diagnosis not present

## 2015-11-24 DIAGNOSIS — K219 Gastro-esophageal reflux disease without esophagitis: Secondary | ICD-10-CM | POA: Diagnosis not present

## 2015-11-24 DIAGNOSIS — K661 Hemoperitoneum: Secondary | ICD-10-CM | POA: Diagnosis not present

## 2015-11-24 DIAGNOSIS — G8918 Other acute postprocedural pain: Secondary | ICD-10-CM | POA: Diagnosis not present

## 2015-11-24 DIAGNOSIS — T814XXA Infection following a procedure, initial encounter: Secondary | ICD-10-CM | POA: Diagnosis not present

## 2015-11-24 DIAGNOSIS — K6389 Other specified diseases of intestine: Secondary | ICD-10-CM | POA: Diagnosis not present

## 2015-11-24 DIAGNOSIS — K913 Postprocedural intestinal obstruction: Secondary | ICD-10-CM | POA: Diagnosis not present

## 2015-11-24 DIAGNOSIS — Z87898 Personal history of other specified conditions: Secondary | ICD-10-CM | POA: Diagnosis not present

## 2015-11-24 DIAGNOSIS — E46 Unspecified protein-calorie malnutrition: Secondary | ICD-10-CM | POA: Diagnosis not present

## 2015-11-24 DIAGNOSIS — K458 Other specified abdominal hernia without obstruction or gangrene: Secondary | ICD-10-CM | POA: Diagnosis not present

## 2015-11-24 DIAGNOSIS — K5790 Diverticulosis of intestine, part unspecified, without perforation or abscess without bleeding: Secondary | ICD-10-CM | POA: Diagnosis not present

## 2015-11-24 HISTORY — PX: SUBTOTAL COLECTOMY: SHX855

## 2015-11-24 NOTE — Patient Outreach (Signed)
[place] [facility]) Care Management  11/24/2015  TEASA JESS August 14, 1956 [id number]  CSW received a new referral on patient from Mariann Laster, Telephonic Nurse Case Manager with [place] Management.  According to Mrs. Hutchinson, patient needs assistance obtaining free or reduced cost eyeglasses via available community agencies and resources.  CSW submitted a request to Josepha Pigg, Care Management Coordinator with [place] Management, to mail resource information to patient's home.  Mrs. Quentin Cornwall will also enclose CSW's business card, encouraging patient to contact CSW directly if patient has questions or needs additional social services and/or assistance.  No additional social work needs identified at this time.  CSW will perform a case closure.  Nat Christen, BSW, MSW, LCSW  Licensed Education officer, environmental Health System  Mailing [address], [place], [place] [address] Physical Address-[address], [place], [place] [address] Toll Free Main # [phone] Fax # [phone] Cell # [phone]  Fax # [phone]  [email or web address]   [place] complies with [employer] civil rights laws and does not discriminate on the basis of race, color, national origin, age, disability, or sex.  Espaol (Spanish)  [place] cumple con las leyes federales de derechos civiles aplicables y no discrimina por motivos de raza, color, nacionalidad, edad, discapacidad o sexo.     Ti?[place] Vi?t ([place])  [place] tun th? lu?t dn quy?n hi?n hnh c?a Lin bang v khng phn bi?t ?i x? d?a trn ch?ng t?c, mu da, ngu?n g?c qu?c gia, ? tu?i, khuy?t t?t, ho?c gi?i tnh.     (Arabic)

## 2015-11-28 ENCOUNTER — Other Ambulatory Visit: Payer: Self-pay

## 2015-11-28 NOTE — Patient Outreach (Signed)
[place] [facility]) Care Management  11/28/2015  [name] [birth year] [id number]  Outreach call to patient.  Patient reached via cell #.  Patient remains IP [place] Provider:  Marland Kitchen, [address]  [place], [place] [address]  [phone]  [phone] (Fax) States discharge unknown at this time.  Plan: RN CM will continue to follow patient's progress for discharge date.   RN CM scheduled for next outreach call within one week.    Mariann Laster, RN, BSN, [facility], CCM  [employer] Management Coordinator [phone] Direct [phone] Cell [phone] Office [phone] Fax

## 2015-11-28 NOTE — Patient Outreach (Signed)
[place] [facility]) Care Management  11/28/2015  [name] 10-29-1956 [id number]   Request received from Nat Christen, LCSW to mail patient information on free eyeglass resources. Information mailed today, 11/28/15.   Jacqulynn Cadet  [facility] Care Management Assistant

## 2015-12-05 ENCOUNTER — Ambulatory Visit: Payer: Self-pay

## 2015-12-08 ENCOUNTER — Ambulatory Visit: Payer: Self-pay

## 2015-12-10 ENCOUNTER — Ambulatory Visit: Payer: Self-pay

## 2015-12-11 ENCOUNTER — Ambulatory Visit: Payer: Self-pay

## 2015-12-12 DIAGNOSIS — K567 Ileus, unspecified: Secondary | ICD-10-CM

## 2015-12-12 DIAGNOSIS — K56609 Unspecified intestinal obstruction, unspecified as to partial versus complete obstruction: Secondary | ICD-10-CM | POA: Insufficient documentation

## 2015-12-12 DIAGNOSIS — K9189 Other postprocedural complications and disorders of digestive system: Secondary | ICD-10-CM | POA: Insufficient documentation

## 2015-12-12 HISTORY — DX: Ileus, unspecified: K56.7

## 2015-12-12 HISTORY — DX: Unspecified intestinal obstruction, unspecified as to partial versus complete obstruction: K56.609

## 2015-12-15 ENCOUNTER — Other Ambulatory Visit: Payer: Self-pay

## 2015-12-15 NOTE — Patient Outreach (Addendum)
[place] [facility]) Care Management  12/15/2015  [name] 1956/11/01 [id number]   Outreach call #1 to patient for telephonic monthly assessment.  Patient not reached at cell #.  Contact reached at home # and states patient is not at home.  RN CM scheduled for next outreach call within one week.   Mariann Laster, RN, BSN, [facility], CCM  [employer] Management Coordinator [phone] Direct [phone] Cell [phone] Office [phone] Fax

## 2015-12-19 ENCOUNTER — Other Ambulatory Visit: Payer: Self-pay

## 2015-12-19 DIAGNOSIS — E1110 Type 2 diabetes mellitus with ketoacidosis without coma: Secondary | ICD-10-CM

## 2015-12-19 NOTE — Patient Outreach (Addendum)
[place] [facility]) Care Management  12/19/2015  [name] June 03, 1956 [id number]   Telephonic Monthly Assessment   Referral Date:  10/05/2016 Source;  [facility] HMO tier 4 Issues:  DM with 6 ED visits and 3 admissions.   Providers: Primary MD: [name], MD - last appt: 11/17/16and 10/21/15 for URI Gastroenterology: Inda Castle, MD HH:  Ordered on 12/19/2015 hospital discharge for wound care management; patient does not know name of agency and states she is unable to get to her discharge paperwork.  Insurance: [employer]   Social: Mobility: Divorced and lives in her home; helps with the care of 3 grandchildren.  Falls: None  Transportation: self. Post hospital discharge plan: Fiance: [name] if in town or other family or friends if available.  Caregiver: friend if needed Emergency Contact: [name], son [phone] Advanced Directive: none. States she only has one son and would like [name] to be the person added to an [facility] mailed document to patient on 11/21/2015.   Resources:  -[place]; states did not qualify for food stamps. -Eyeglass resources mailed to patient from [facility] on 11/28/15.  DME: reading glasses, CBG meter.  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  Surgery 11/24/2015 at [facility] to "remove my colon." States h/o rectal bleeding for 10 years.  Admissions: 2 > 6 months  ER visits: 0 > 6 months  Patient discharged home 12/19/2015 (today).  Medications:  Co-pay cost issue: No  Patient unable to review medications; states unable to get to paperwork.   Plan:  Referral Date:  10/05/2016 Screening:  10/12/2016 Initial Assessment:  11/21/2015 Telephonic RN CM services 10/12/2016 - 12/19/2015  Program:  Diabetes  [facility] Community RN CM Referral -Admission within past 30 days.  Discharge date:  12/19/2015    RN CM advised in next  contact call within next 10 business days for [place] services.  RN CM advised to please notify MD of any changes in condition prior to scheduled appt's.  RN CM provided contact name and # [phone] or main office # [phone] and 24-hour nurse line # [phone].  RN CM confirmed patient is aware of 911 services for urgent emergency needs.

## 2015-12-22 ENCOUNTER — Other Ambulatory Visit: Payer: Self-pay

## 2015-12-22 DIAGNOSIS — Z48815 Encounter for surgical aftercare following surgery on the digestive system: Secondary | ICD-10-CM | POA: Diagnosis not present

## 2015-12-22 DIAGNOSIS — E119 Type 2 diabetes mellitus without complications: Secondary | ICD-10-CM | POA: Diagnosis not present

## 2015-12-22 DIAGNOSIS — I10 Essential (primary) hypertension: Secondary | ICD-10-CM | POA: Diagnosis not present

## 2015-12-22 NOTE — Patient Outreach (Signed)
[place] [facility]) Care Management  12/22/2015  [name] 27-Mar-1956 [id number]  Transition of care call. recent admission to [facility] reports colon has been removed. Home health is involved for wound care and came out to see member today. Member reports she has her follow up appointment scheduled. Member states she has her discharge instructions and understands them. Member reports she has all her medications and is without questions at this time.  Plan: home visit scheduled for next week. RNCM encouraged member to call as needed/discussed RNCM contact information.  Thea Silversmith, RN, MSN, [place] Coordinator Cell: [phone]

## 2015-12-23 ENCOUNTER — Encounter: Payer: Self-pay | Admitting: Family Medicine

## 2015-12-24 ENCOUNTER — Ambulatory Visit: Payer: Commercial Managed Care - HMO | Admitting: Podiatry

## 2015-12-25 ENCOUNTER — Ambulatory Visit (INDEPENDENT_AMBULATORY_CARE_PROVIDER_SITE_OTHER): Payer: Commercial Managed Care - HMO | Admitting: Family Medicine

## 2015-12-25 ENCOUNTER — Encounter: Payer: Self-pay | Admitting: Family Medicine

## 2015-12-25 ENCOUNTER — Ambulatory Visit (HOSPITAL_COMMUNITY)
Admission: RE | Admit: 2015-12-25 | Discharge: 2015-12-25 | Disposition: A | Discharge status: Home / Self Care | Payer: Commercial Managed Care - HMO | Source: Ambulatory Visit | Attending: Family Medicine | Admitting: Family Medicine

## 2015-12-25 VITALS — BP 150/94 | HR 99 | Temp 97.9°F | Wt 172.7 lb

## 2015-12-25 DIAGNOSIS — I499 Cardiac arrhythmia, unspecified: Secondary | ICD-10-CM | POA: Diagnosis not present

## 2015-12-25 DIAGNOSIS — Z9049 Acquired absence of other specified parts of digestive tract: Secondary | ICD-10-CM

## 2015-12-25 DIAGNOSIS — I10 Essential (primary) hypertension: Secondary | ICD-10-CM

## 2015-12-25 DIAGNOSIS — R9431 Abnormal electrocardiogram [ECG] [EKG]: Secondary | ICD-10-CM | POA: Diagnosis not present

## 2015-12-25 DIAGNOSIS — I491 Atrial premature depolarization: Secondary | ICD-10-CM | POA: Insufficient documentation

## 2015-12-25 DIAGNOSIS — R0989 Other specified symptoms and signs involving the circulatory and respiratory systems: Secondary | ICD-10-CM | POA: Insufficient documentation

## 2015-12-25 HISTORY — DX: Atrial premature depolarization: I49.1

## 2015-12-25 LAB — POCT HEMOGLOBIN: Hemoglobin: 9.8 g/dL — AB (ref 12.2–16.2)

## 2015-12-25 MED ORDER — ONDANSETRON HCL 4 MG PO TABS: 4.0000 mg | ORAL_TABLET | Freq: Three times a day (TID) | ORAL | Status: DC | PRN

## 2015-12-25 MED ORDER — PROMETHAZINE HCL 12.5 MG PO TABS: 12.5000 mg | ORAL_TABLET | Freq: Three times a day (TID) | ORAL | Status: DC | PRN

## 2015-12-25 MED ORDER — OXYCODONE HCL 5 MG PO TABS: 5.0000 mg | ORAL_TABLET | Freq: Four times a day (QID) | ORAL | Status: DC | PRN

## 2015-12-25 NOTE — Patient Instructions (Signed)
It was nice to see you today.  Nausea - start Phenergan (I also sent in Zofran - please do NOT start this)  Pain control - refill of Oxycodone provided  Hight Blood pressure - continue Lisinopril and HCTZ, blood pressure elevated likely due to pain, do not Start Metoprolol (on hospital discharge list). Return in 2-4 weeks for blood pressure check.

## 2015-12-25 NOTE — Progress Notes (Signed)
   Subjective:    Patient ID: [name], female    DOB: 06-Jun-1956, 60 y.o.   MRN: [id number]  HPI 60 y/o female presents for hospital follow up. Records not available for review at time of office visit (previously placed in scan box).   Hospitalized at [facility] on 11/24/15 for a subtotal colectomy. Primary Anastomosis.  Had to return to OR on 11/25/15 (paitent unclear of the indication, ?blockage) Had to return to OR a few days later for drainage of fluid collection in abdomen.  Discharged 2/17. Scheduled to see the surgeon on the 6th (Dr. Morton Stall)  Since returning home patient reports decreased appetite, tolerating water/fluids (unablet to quantify) and ensure. Feels full only after a few bites. Not currently on any medications for nausea. Had has multiple BM since returning home. No blood in stool. Pain in abdomen currently 7-8/10 (Taking Oxycodone as needed, mostly taking at night (taking 2-3 per day).   Blood pressure - fluctuating during hospital stay, Previously on Lisinopril 20 mg daily prior to hospital stay; increased Lisionpril to 40 mg daily, added HCTZ 25 mg daily and Metoprolol 37.5 mg twice daily (patient not taking Metoprolol).   ?Murmur - patient reports murmur in hospital   Social - nonsmoker  Review of Systems  Constitutional: Negative for chills and fatigue.  Respiratory: Negative for cough and shortness of breath.   Cardiovascular: Negative for chest pain and leg swelling.  Gastrointestinal: Positive for abdominal pain. Negative for nausea, vomiting and diarrhea.       Objective:   Physical Exam Filed Vitals:   12/25/15 0909  BP: 150/94  Pulse: 99  Temp: 97.9 F (36.6 C)   Gen: pleasant female, appears in some distress due to abdominal pain Cardiac: RRR, occasional abnormal beat, no murmur, no heaves/thrills Resp: CTAB, normal effort Abd: soft, mild diffuse tenderness, normal bowel sounds, multiple well healing laproscopic site, small port site just  superior to umbilicus has packing present and no associated warmth/drainage/erythema; 4 cm open incision over the suprapubic area - healing well, no associated erythema/warmth/drainage  EKG  In office - NSR, occasional PAC, no acute ischemic changes PCO Hemoglobin 9.8     Assessment & Plan:  S/P laparoscopic colectomy Hospital Follow up for laproscopic colectomy at [facility] on 11/24/15. Incision sites healing well. Patient tolerating liquid diet. -continue local wound care -refill of Oxycodone provided -started Phenergan to help with associated nausea -Encouraged to follow up with Surgeon Dr. Morton Stall on 12/08/15 -continue home bowel regimen of Colace and Miralax  HTN, goal below 140/90 Elevated today. Likely due to pain from recent surgery. HCTZ added at recent Hospitalization. -continue Lisinopril 40 mg and HCTZ 25 mg daily -return in 2 weeks for recheck of blood pressure  Premature supraventricular beats Premature supraventricular complexes on EKG (ordered due to occasional abnormal beat on exam). Patient asymptomatic. -monitor clinically, hold on cardiology referral at this time as she is asymptomatic -discussed return precautions

## 2015-12-25 NOTE — Assessment & Plan Note (Signed)
Premature supraventricular complexes on EKG (ordered due to occasional abnormal beat on exam). Patient asymptomatic. -monitor clinically, hold on cardiology referral at this time as she is asymptomatic -discussed return precautions

## 2015-12-25 NOTE — Assessment & Plan Note (Signed)
Hospital Follow up for laproscopic colectomy at [facility] on 11/24/15. Incision sites healing well. Patient tolerating liquid diet. -continue local wound care -refill of Oxycodone provided -started Phenergan to help with associated nausea -Encouraged to follow up with Surgeon Dr. Morton Stall on 12/08/15 -continue home bowel regimen of Colace and Miralax

## 2015-12-25 NOTE — Assessment & Plan Note (Signed)
Elevated today. Likely due to pain from recent surgery. HCTZ added at recent Hospitalization. -continue Lisinopril 40 mg and HCTZ 25 mg daily -return in 2 weeks for recheck of blood pressure

## 2015-12-26 DIAGNOSIS — E119 Type 2 diabetes mellitus without complications: Secondary | ICD-10-CM | POA: Diagnosis not present

## 2015-12-26 DIAGNOSIS — Z48815 Encounter for surgical aftercare following surgery on the digestive system: Secondary | ICD-10-CM | POA: Diagnosis not present

## 2015-12-26 DIAGNOSIS — I10 Essential (primary) hypertension: Secondary | ICD-10-CM | POA: Diagnosis not present

## 2015-12-29 ENCOUNTER — Other Ambulatory Visit: Payer: Self-pay | Admitting: Family Medicine

## 2015-12-29 DIAGNOSIS — H539 Unspecified visual disturbance: Secondary | ICD-10-CM

## 2015-12-29 NOTE — Telephone Encounter (Signed)
Pt called and is requesting a referral to see an eye doctor. She said that while she was in the hospital she was having problems with the light and pain in her eye. jw

## 2015-12-29 NOTE — Telephone Encounter (Signed)
Pt is calling about her BP medication that she received in the hospital and needed to know if this something that she will continue taking or should she stop . [name]

## 2015-12-30 DIAGNOSIS — H539 Unspecified visual disturbance: Secondary | ICD-10-CM | POA: Insufficient documentation

## 2015-12-30 NOTE — Telephone Encounter (Signed)
Spoke with patient, she states she is out of HCTZ. Would like a new rx sent to pharmacy.

## 2015-12-30 NOTE — Telephone Encounter (Signed)
RN staff - please inform patient that I have placed a referral to ophthalmology, in regards to her BP meds she should take only the lisinopril and HCTZ (Reinforce that I outlined this in her AVS paperwork from our last visit). Thanks.

## 2015-12-31 MED ORDER — HYDROCHLOROTHIAZIDE 12.5 MG PO CAPS: 12.5000 mg | ORAL_CAPSULE | Freq: Every day | ORAL | Status: DC

## 2015-12-31 NOTE — Addendum Note (Signed)
Addended byMingo Amber, Charly Holcomb H on: 12/31/2015 01:25 PM   Modules accepted: Orders

## 2015-12-31 NOTE — Telephone Encounter (Signed)
I see that she was on 12.5 mg HCTZ in past.  Will send this in today.

## 2016-01-01 ENCOUNTER — Other Ambulatory Visit: Payer: Self-pay

## 2016-01-01 VITALS — BP 124/82 | HR 82 | Resp 20 | Ht 64.0 in | Wt 170.0 lb

## 2016-01-01 DIAGNOSIS — Z789 Other specified health status: Secondary | ICD-10-CM

## 2016-01-01 NOTE — Patient Outreach (Signed)
[place] [facility]) Care Management  [place]  01/01/2016   [name] 28-Apr-1956 [id number]  Subjective: Member reports improvement with pain level- taking one pain pill during the day and one at night.  Objective: BP 124/82 mmHg  Pulse 82  Resp 20  Ht 1.626 m ('5\' 4"' )  Wt 170 lb (77.111 kg)  BMI 29.17 kg/m2  SpO2 99% Lungs clear, respirations even unlabored. Heart rate slight irregularity noted.  Current Medications:  Current Outpatient Prescriptions  Medication Sig Dispense Refill  . aspirin 81 MG tablet Take 81 mg by mouth daily.    Marland Kitchen atorvastatin (LIPITOR) 40 MG tablet TAKE ONE TABLET EACH DAY 90 tablet 1  . benzonatate (TESSALON) 100 MG capsule Take 1 capsule (100 mg total) by mouth 2 (two) times daily as needed for cough. 20 capsule 1  . calcium carbonate (OS-CAL) 600 MG TABS tablet Take 1 tablet (600 mg total) by mouth 2 (two) times daily with a meal. 180 tablet 1  . Cetirizine HCl (ZYRTEC ALLERGY) 10 MG CAPS Take 1 capsule (10 mg total) by mouth daily. (Patient taking differently: Take 1 capsule by mouth at bedtime. ) 30 capsule 2  . Cholecalciferol 1000 units TBDP Take 1,000 mg by mouth 1 day or 1 dose.    Marland Kitchen DOK 100 MG capsule TAKE ONE CAPSULE TWICE A DAY AS NEEDED FOR CONSTIPATION 180 capsule 0  . esomeprazole (NEXIUM) 40 MG capsule Take 1 tab every morning. 30 capsule 11  . ferrous sulfate 220 (44 FE) MG/5ML solution Take 5 mLs (220 mg total) by mouth 2 (two) times daily with a meal. 473 mL 2  . fluticasone (FLONASE) 50 MCG/ACT nasal spray Place 2 sprays into both nostrils at bedtime. 16 g 6  . hydrochlorothiazide (MICROZIDE) 12.5 MG capsule Take 1 capsule (12.5 mg total) by mouth daily. 30 capsule 0  . lisinopril (PRINIVIL,ZESTRIL) 20 MG tablet TAKE ONE TABLET BY MOUTH ONCE DAILY 30 tablet 3  . Multiple Vitamin (MULTIVITAMIN) tablet Take 1 tablet by mouth daily. 90 tablet 1  . oxyCODONE (OXY IR/ROXICODONE) 5 MG immediate release tablet Take 1 tablet  (5 mg total) by mouth every 6 (six) hours as needed for severe pain. 60 tablet 0  . promethazine (PHENERGAN) 12.5 MG tablet Take 1 tablet (12.5 mg total) by mouth every 8 (eight) hours as needed for nausea or vomiting. 40 tablet 1  . sucralfate (CARAFATE) 1 GM/10ML suspension Take 10 mLs (1 g total) by mouth 4 (four) times daily -  with meals and at bedtime. 420 mL 1  . vitamin B-12 (CYANOCOBALAMIN) 1000 MCG tablet Take 1,000 mcg by mouth daily.    . vitamin C (ASCORBIC ACID) 250 MG tablet Take 250 mg by mouth daily.    . vitamin E 400 UNIT capsule Take 400 Units by mouth daily.    Marland Kitchen zolpidem (AMBIEN) 10 MG tablet TAKE ONE TABLET AT BEDTIME AS NEEDED FOR SLEEP 30 tablet 1  . furosemide (LASIX) 20 MG tablet Take 1 tablet (20 mg total) by mouth daily as needed. 30 tablet 1  . polyethylene glycol powder (GLYCOLAX/MIRALAX) powder TAKE 17G BY MOUTH TWICE DAILY (Patient not taking: Reported on 01/01/2016) 850 g 2   No current facility-administered medications for this visit.    Functional Status:  In your present state of health, do you have any difficulty performing the following activities: 01/01/2016 11/21/2015  Hearing? N N  Vision? N N  Difficulty concentrating or making decisions? N N  Walking or climbing stairs? Y N  Dressing or bathing? N N  Doing errands, shopping? Y N  Preparing Food and eating ? N N  Using the Toilet? N N  In the past six months, have you accidently leaked urine? N N  Do you have problems with loss of bowel control? Y N  Managing your Medications? N N  Managing your Finances? N N  Housekeeping or managing your Housekeeping? N N    Fall/Depression Screening: PHQ 2/9 Scores 01/01/2016 12/25/2015 11/21/2015 10/13/2015 09/18/2015 07/17/2015 05/26/2015  PHQ - 2 Score 0 0 0 0 0 0 0   Fall Risk  01/01/2016 11/21/2015 04/08/2014 10/19/2013 09/17/2013  Falls in the past year? No No No Yes Yes  Number falls in past yr: - - - - 1     Assessment: 60 year old with recent surgery at  [facility] for . Member is without concerns at this time. Home health nurse is following to assist with dressing changes. Dressing just changed per member-clean dry intact. RNCM reviewed signs/symptoms of infection.   Per member she is in some pain, but is at a tolerable level for her. Member reports she has pain medication to take when she needs it. Member states she has noticed a decrease in the amount of pain medication needed to control her pain.  Member reports she would like Advanced directive information.  Medication review-Medicines reviewed. Furosemide is on member's profile, however she was not sure if this is a medication she is supposed to be taking and does not have furosemide in her medication box. RNCM will refer to pharmacy for medication reconciliation.   Member reports drinking protein supplement. RNCM discussed the difference in protein supplements and nutritional supplements such as Glucerna.   Plan: telephonic transition of care next week. [facility] CM Care Plan Problem One        Most Recent Value   Care Plan Problem One  Diabetes management during GI / colon surgery scheduled for 11/24/2015.    Role Documenting the Problem One  Care Management Telephonic Coordinator   Care Plan for Problem One  Active   THN Long Term Goal (31-90 days)  Patient will have expected recovery without DM management complications or risk over the next 31-90 days.    THN Long Term Goal Start Date  11/21/15   Interventions for Problem One Long Term Goal  discussed the importance of checking blood sugar and knowing target range and A1C level.   THN CM Short Term Goal #1 (0-30 days)  Patient will self manage BS during post surgical admission recovery over the next 30 days.    THN CM Short Term Goal #1 Start Date  11/21/15   Interventions for Short Term Goal #1  Discussed goals for member.    [facility] CM Care Plan Problem Two        Most Recent Value   Care Plan Problem Two  Medication Reconciliation     Role Documenting the Problem Two  Care Management Telephonic Coordinator   Care Plan for Problem Two  Not Active   THN CM Short Term Goal #1 (0-30 days)  Patient will complete post hospital discharge with RN CM over the next 30 days.    THN CM Short Term Goal #1 Start Date  12/19/15   [facility] CM Short Term Goal #1 Met Date   01/02/16   Interventions for Short Term Goal #2   home visit completed.    [place]  Problem Three        Most Recent Value   Care Plan Problem Three  Advanced Directives:  None    Role Documenting the Problem Three  Care Management Telephonic Coordinator   Care Plan for Problem Three  Not Active   THN CM Short Term Goal #1 (0-30 days)  Patient interested in Advanced Directive and will engage with RN CM in completing document over the next 30 days.    THN CM Short Term Goal #1 Start Date  11/21/15   [facility] CM Short Term Goal #1 Met Date  01/01/16   Interventions for Short Term Goal #1  RN CM will mail copy of Advance Directive over the next 30 days.      Thea Silversmith, RN, MSN, [place] Coordinator Cell: [phone]

## 2016-01-02 DIAGNOSIS — E119 Type 2 diabetes mellitus without complications: Secondary | ICD-10-CM | POA: Diagnosis not present

## 2016-01-02 DIAGNOSIS — I10 Essential (primary) hypertension: Secondary | ICD-10-CM | POA: Diagnosis not present

## 2016-01-02 DIAGNOSIS — Z48815 Encounter for surgical aftercare following surgery on the digestive system: Secondary | ICD-10-CM | POA: Diagnosis not present

## 2016-01-08 ENCOUNTER — Other Ambulatory Visit: Payer: Self-pay

## 2016-01-08 DIAGNOSIS — I10 Essential (primary) hypertension: Secondary | ICD-10-CM | POA: Diagnosis not present

## 2016-01-08 DIAGNOSIS — Z48815 Encounter for surgical aftercare following surgery on the digestive system: Secondary | ICD-10-CM | POA: Diagnosis not present

## 2016-01-08 DIAGNOSIS — E119 Type 2 diabetes mellitus without complications: Secondary | ICD-10-CM | POA: Diagnosis not present

## 2016-01-08 NOTE — Patient Outreach (Signed)
01/08/16  Subjective  [name] is a 60 year old female who was referred to pharmacy for a medication reconciliation.  She also had a question regarding her furosemide.  She could not remember why she was on it and what it was for.   Objective  Outpatient Encounter Prescriptions as of 01/08/2016  Medication Sig Note  . aspirin 81 MG tablet Take 81 mg by mouth daily.   Marland Kitchen atorvastatin (LIPITOR) 40 MG tablet TAKE ONE TABLET EACH DAY   . benzonatate (TESSALON) 100 MG capsule Take 1 capsule (100 mg total) by mouth 2 (two) times daily as needed for cough.   . calcium carbonate (OS-CAL) 600 MG TABS tablet Take 1 tablet (600 mg total) by mouth 2 (two) times daily with a meal.   . Cetirizine HCl (ZYRTEC ALLERGY) 10 MG CAPS Take 1 capsule (10 mg total) by mouth daily. (Patient taking differently: Take 1 capsule by mouth at bedtime. ) 01/01/2016: Taking as needed during allergy season.  . Cholecalciferol 1000 units TBDP Take 1,000 mg by mouth 1 day or 1 dose. 01/01/2016: Taking 5000 IU daily.  Marland Kitchen DOK 100 MG capsule TAKE ONE CAPSULE TWICE A DAY AS NEEDED FOR CONSTIPATION 01/01/2016: Taking one capsule daily  . esomeprazole (NEXIUM) 40 MG capsule Take 1 tab every morning.   . ferrous sulfate 220 (44 FE) MG/5ML solution Take 5 mLs (220 mg total) by mouth 2 (two) times daily with a meal.   . fluticasone (FLONASE) 50 MCG/ACT nasal spray Place 2 sprays into both nostrils at bedtime.   . furosemide (LASIX) 20 MG tablet Take 1 tablet (20 mg total) by mouth daily as needed.   . hydrochlorothiazide (MICROZIDE) 12.5 MG capsule Take 1 capsule (12.5 mg total) by mouth daily. 01/01/2016: Has 25 mg tablets. Takes one 25 mg tablet daily.   Marland Kitchen lisinopril (PRINIVIL,ZESTRIL) 20 MG tablet TAKE ONE TABLET BY MOUTH ONCE DAILY   . Multiple Vitamin (MULTIVITAMIN) tablet Take 1 tablet by mouth daily.   Marland Kitchen oxyCODONE (OXY IR/ROXICODONE) 5 MG immediate release tablet Take 1 tablet (5 mg total) by mouth every 6 (six) hours as needed for severe  pain.   . promethazine (PHENERGAN) 12.5 MG tablet Take 1 tablet (12.5 mg total) by mouth every 8 (eight) hours as needed for nausea or vomiting.   . sucralfate (CARAFATE) 1 GM/10ML suspension Take 10 mLs (1 g total) by mouth 4 (four) times daily -  with meals and at bedtime.   . vitamin B-12 (CYANOCOBALAMIN) 1000 MCG tablet Take 1,000 mcg by mouth daily.   . vitamin C (ASCORBIC ACID) 250 MG tablet Take 250 mg by mouth daily.   . vitamin E 400 UNIT capsule Take 400 Units by mouth daily.   Marland Kitchen zolpidem (AMBIEN) 10 MG tablet TAKE ONE TABLET AT BEDTIME AS NEEDED FOR SLEEP   . polyethylene glycol powder (GLYCOLAX/MIRALAX) powder TAKE 17G BY MOUTH TWICE DAILY (Patient not taking: Reported on 01/01/2016)    No facility-administered encounter medications on file as of 01/08/2016.   Assessment  Drugs sorted by system:  Neurologic/Psychologic: zolpidem  Cardiovascular: aspirin, atorvastatin, furosemide, hydrochlorothiazide, lisinopril,   Pulmonary/Allergy: cetirizine, fluticasone, benzonatate  Gastrointestinal: esomeprazole, sucralfate, promethazine, docustate  Endocrine: none  Renal: none  Topical: none  Pain: oxycodone  Vitamins/Minerals: ascorbic acid, vitamin e, cholecalciferol, cyanocobalamin, calcium carbonate, ferrous sulfate, multivitamin  Infectious Diseases: none  Miscellaneous:   Duplications in therapy:  none Gaps in therapy:  none Medications to avoid in the elderly:  none Drug interactions:  none Other issues noted:  none  Plan 1.  Furosemide was prescribed by Dr. Ree Kida in September 2016 for swelling she had in her legs.  She is only to take it as needed.  When I explained this to her, she stated she remembered this.   2.  There are no drug interactions or other problems noted with her medications.   3.  I will close her case to pharmacy.  I am happy to assist with other pharmacy issues as they arise in the future.  I will let her nurse care manager know of my findings.     Deanne Coffer, PharmD, [place] [phone]

## 2016-01-09 ENCOUNTER — Other Ambulatory Visit: Payer: Self-pay

## 2016-01-09 ENCOUNTER — Telehealth: Payer: Self-pay | Admitting: *Deleted

## 2016-01-09 ENCOUNTER — Ambulatory Visit: Payer: Commercial Managed Care - HMO | Admitting: Family Medicine

## 2016-01-09 NOTE — Telephone Encounter (Signed)
Pt didn't realize that her appt was today.  Dr. Nedra Hai next available is not until 3/30 she wants to know if that is ok or if he wants her be sooner to follow up on her BP. Idona Stach, Salome Spotted

## 2016-01-09 NOTE — Telephone Encounter (Signed)
Dr. Ree Kida said its ok to schedule then if she is not having any symptoms. Pt stated she wasn't having symptoms and i gave her an appt time. Katharin Schneider Kennon Holter, CMA

## 2016-01-09 NOTE — Patient Outreach (Signed)
[facility]) Care Management  01/09/2016  [name] 1956/07/14 [id number]   Transition of care call. Member reports she had her follow up appointment with her surgeon and the wound is healling well. Member states she no longer has to pack her wound, she is just placing a guaze covering.  Member acknowledges that she has not been checking her blood sugars, but states she will start. Member states she has been checking her blood pressure every other night and last checked, her pressure was 115/83.  Plan: Telephonic call next week.  Thea Silversmith, RN, MSN, [place] Coordinator Cell: [phone]

## 2016-01-14 ENCOUNTER — Ambulatory Visit (INDEPENDENT_AMBULATORY_CARE_PROVIDER_SITE_OTHER): Payer: Commercial Managed Care - HMO | Admitting: Podiatry

## 2016-01-14 ENCOUNTER — Encounter: Payer: Self-pay | Admitting: Podiatry

## 2016-01-14 DIAGNOSIS — B351 Tinea unguium: Secondary | ICD-10-CM

## 2016-01-14 DIAGNOSIS — M79674 Pain in right toe(s): Secondary | ICD-10-CM | POA: Diagnosis not present

## 2016-01-14 DIAGNOSIS — M79675 Pain in left toe(s): Secondary | ICD-10-CM

## 2016-01-14 NOTE — Progress Notes (Signed)
Patient ID: [name], female   DOB: 1956-06-07, 60 y.o.   MRN: [id number]   Subjective: This patient presents for scheduled visit complaining of painful toenails walking wearing shoes and request nail debridement  Objective: Orientated 3 No open skin lesions lesions bilaterally Hypertrophic, incurvated, discolored, deformed toenails 1-5 left and 1, 3, 4 right  Assessment: Symptomatic onychomycoses 8: History of diabetes  Plan: Debridement toenails 8 mechanically and electrically without a bleeding  Reappoint 3 months

## 2016-01-14 NOTE — Patient Instructions (Signed)
Diabetes and Foot Care Diabetes may cause you to have problems because of poor blood supply (circulation) to your feet and legs. This may cause the skin on your feet to become thinner, break easier, and heal more slowly. Your skin may become dry, and the skin may peel and crack. You may also have nerve damage in your legs and feet causing decreased feeling in them. You may not notice minor injuries to your feet that could lead to infections or more serious problems. Taking care of your feet is one of the most important things you can do for yourself.  HOME CARE INSTRUCTIONS  Wear shoes at all times, even in the house. Do not go barefoot. Bare feet are easily injured.  Check your feet daily for blisters, cuts, and redness. If you cannot see the bottom of your feet, use a mirror or ask someone for help.  Wash your feet with warm water (do not use hot water) and mild soap. Then pat your feet and the areas between your toes until they are completely dry. Do not soak your feet as this can dry your skin.  Apply a moisturizing lotion or petroleum jelly (that does not contain alcohol and is unscented) to the skin on your feet and to dry, brittle toenails. Do not apply lotion between your toes.  Trim your toenails straight across. Do not dig under them or around the cuticle. File the edges of your nails with an emery board or nail file.  Do not cut corns or calluses or try to remove them with medicine.  Wear clean socks or stockings every day. Make sure they are not too tight. Do not wear knee-high stockings since they may decrease blood flow to your legs.  Wear shoes that fit properly and have enough cushioning. To break in new shoes, wear them for just a few hours a day. This prevents you from injuring your feet. Always look in your shoes before you put them on to be sure there are no objects inside.  Do not cross your legs. This may decrease the blood flow to your feet.  If you find a minor scrape,  cut, or break in the skin on your feet, keep it and the skin around it clean and dry. These areas may be cleansed with mild soap and water. Do not cleanse the area with peroxide, alcohol, or iodine.  When you remove an adhesive bandage, be sure not to damage the skin around it.  If you have a wound, look at it several times a day to make sure it is healing.  Do not use heating pads or hot water bottles. They may burn your skin. If you have lost feeling in your feet or legs, you may not know it is happening until it is too late.  Make sure your health care provider performs a complete foot exam at least annually or more often if you have foot problems. Report any cuts, sores, or bruises to your health care provider immediately. SEEK MEDICAL CARE IF:   You have an injury that is not healing.  You have cuts or breaks in the skin.  You have an ingrown nail.  You notice redness on your legs or feet.  You feel burning or tingling in your legs or feet.  You have pain or cramps in your legs and feet.  Your legs or feet are numb.  Your feet always feel cold. SEEK IMMEDIATE MEDICAL CARE IF:   There is increasing redness,   swelling, or pain in or around a wound.  There is a red line that goes up your leg.  Pus is coming from a wound.  You develop a fever or as directed by your health care provider.  You notice a bad smell coming from an ulcer or wound.   This information is not intended to replace advice given to you by your health care provider. Make sure you discuss any questions you have with your health care provider.   Document Released: 10/15/2000 Document Revised: 06/20/2013 Document Reviewed: 03/27/2013 Elsevier Interactive Patient Education 2016 Elsevier Inc.  

## 2016-01-16 ENCOUNTER — Other Ambulatory Visit: Payer: Self-pay

## 2016-01-16 DIAGNOSIS — H52223 Regular astigmatism, bilateral: Secondary | ICD-10-CM | POA: Diagnosis not present

## 2016-01-16 DIAGNOSIS — H5203 Hypermetropia, bilateral: Secondary | ICD-10-CM | POA: Diagnosis not present

## 2016-01-16 DIAGNOSIS — H524 Presbyopia: Secondary | ICD-10-CM | POA: Diagnosis not present

## 2016-01-16 NOTE — Patient Outreach (Signed)
[place] [facility]) Care Management  01/16/2016  [name] Sep 13, 1956 [id number]  Transition of care call. Member reports she is at the opthamologist office at this time and states she will call RNCM back.  Plan: follow up call on Monday if no return call.  Thea Silversmith, RN, MSN, [place] Coordinator Cell: [phone]

## 2016-01-22 ENCOUNTER — Telehealth: Payer: Self-pay | Admitting: Family Medicine

## 2016-01-22 DIAGNOSIS — E8809 Other disorders of plasma-protein metabolism, not elsewhere classified: Secondary | ICD-10-CM

## 2016-01-22 NOTE — Telephone Encounter (Signed)
Spoke with pt and she said she has been having cramps since Sunday. Sometimes its to the point that she cant walk, especially after driving. She states that she has been eating bananas to help with cramps. Please advise. Catelyn Friel Kennon Holter, CMA

## 2016-01-22 NOTE — Telephone Encounter (Signed)
Pt called and would like to speak to the nurse for Dr. Ree Kida or the doctor himself. She is having very bad cramps in her legs and sometimes it is hard to stand or walk. Please call discuss.jw

## 2016-01-23 ENCOUNTER — Other Ambulatory Visit: Payer: Self-pay

## 2016-01-23 NOTE — Telephone Encounter (Signed)
Spoke with patient. Intermittent leg cramps over the past week. Recent bowel resection. Tolerating liquids, poor PO intake. Recommended drinking 1-2 Gatorade per day and adding salt to diet. If continues over the next few days consider labs work.

## 2016-01-23 NOTE — Patient Outreach (Signed)
[place] [facility]) Care Management  01/23/2016  [name] 1956/05/14 [id number]  60 year old with recent post recent admission for colon removal. RNCM called for transition of care. No answer. HIPPA compliant message left.  Plan: follow up call next week.  Thea Silversmith, RN, MSN, [place] Coordinator Cell: [phone]

## 2016-01-27 NOTE — Telephone Encounter (Signed)
Patient called back to ask Dr. Ree Kida to contact her.

## 2016-01-28 ENCOUNTER — Ambulatory Visit: Payer: Commercial Managed Care - HMO | Admitting: Obstetrics and Gynecology

## 2016-01-28 DIAGNOSIS — I10 Essential (primary) hypertension: Secondary | ICD-10-CM | POA: Diagnosis not present

## 2016-01-28 DIAGNOSIS — Z48815 Encounter for surgical aftercare following surgery on the digestive system: Secondary | ICD-10-CM | POA: Diagnosis not present

## 2016-01-28 DIAGNOSIS — E119 Type 2 diabetes mellitus without complications: Secondary | ICD-10-CM | POA: Diagnosis not present

## 2016-01-28 NOTE — Telephone Encounter (Signed)
Patient continues to have cramps and now has "vein" popping out on thigh that is painful. Has appointment tomorrow. Will check labs and evaluate leg.

## 2016-01-29 ENCOUNTER — Other Ambulatory Visit: Payer: Self-pay | Admitting: Family Medicine

## 2016-01-29 ENCOUNTER — Other Ambulatory Visit: Payer: Self-pay

## 2016-01-29 ENCOUNTER — Ambulatory Visit (INDEPENDENT_AMBULATORY_CARE_PROVIDER_SITE_OTHER): Payer: Commercial Managed Care - HMO | Admitting: Family Medicine

## 2016-01-29 ENCOUNTER — Encounter: Payer: Self-pay | Admitting: Family Medicine

## 2016-01-29 VITALS — BP 138/90 | HR 85 | Temp 98.1°F | Ht 64.0 in | Wt 177.6 lb

## 2016-01-29 DIAGNOSIS — R252 Cramp and spasm: Secondary | ICD-10-CM

## 2016-01-29 DIAGNOSIS — G4700 Insomnia, unspecified: Secondary | ICD-10-CM | POA: Diagnosis not present

## 2016-01-29 DIAGNOSIS — M7989 Other specified soft tissue disorders: Secondary | ICD-10-CM

## 2016-01-29 DIAGNOSIS — E119 Type 2 diabetes mellitus without complications: Secondary | ICD-10-CM

## 2016-01-29 DIAGNOSIS — I809 Phlebitis and thrombophlebitis of unspecified site: Secondary | ICD-10-CM

## 2016-01-29 DIAGNOSIS — R609 Edema, unspecified: Secondary | ICD-10-CM

## 2016-01-29 HISTORY — DX: Phlebitis and thrombophlebitis of unspecified site: I80.9

## 2016-01-29 LAB — CBC
HCT: 32.9 % — ABNORMAL LOW (ref 36.0–46.0)
Hemoglobin: 10.4 g/dL — ABNORMAL LOW (ref 12.0–15.0)
MCH: 28.5 pg (ref 26.0–34.0)
MCHC: 31.6 g/dL (ref 30.0–36.0)
MCV: 90.1 fL (ref 78.0–100.0)
MPV: 9.7 fL (ref 8.6–12.4)
Platelets: 224 10*3/uL (ref 150–400)
RBC: 3.65 MIL/uL — ABNORMAL LOW (ref 3.87–5.11)
RDW: 16.9 % — ABNORMAL HIGH (ref 11.5–15.5)
WBC: 6.1 10*3/uL (ref 4.0–10.5)

## 2016-01-29 LAB — COMPLETE METABOLIC PANEL WITH GFR
ALT: 26 U/L (ref 6–29)
AST: 24 U/L (ref 10–35)
Albumin: 3 g/dL — ABNORMAL LOW (ref 3.6–5.1)
Alkaline Phosphatase: 69 U/L (ref 33–130)
BUN: 14 mg/dL (ref 7–25)
CO2: 21 mmol/L (ref 20–31)
Calcium: 8.2 mg/dL — ABNORMAL LOW (ref 8.6–10.4)
Chloride: 112 mmol/L — ABNORMAL HIGH (ref 98–110)
Creat: 0.86 mg/dL (ref 0.50–1.05)
GFR, Est African American: 86 mL/min (ref >=60)
GFR, Est Non African American: 74 mL/min (ref >=60)
Glucose, Bld: 65 mg/dL (ref 65–99)
Potassium: 4.2 mmol/L (ref 3.5–5.3)
Sodium: 141 mmol/L (ref 135–146)
Total Bilirubin: 0.4 mg/dL (ref 0.2–1.2)
Total Protein: 5.7 g/dL — ABNORMAL LOW (ref 6.1–8.1)

## 2016-01-29 LAB — MAGNESIUM: Magnesium: 1.8 mg/dL (ref 1.5–2.5)

## 2016-01-29 MED ORDER — ZOLPIDEM TARTRATE 10 MG PO TABS: ORAL_TABLET | ORAL | Status: DC

## 2016-01-29 NOTE — Patient Outreach (Signed)
[facility]) Care Management  01/29/2016  [name] 1956-05-08 [id number]  RNCM called for lastTransition of care call. Member hospitalized for colon removal in February. Member report area has healed. She has been following up with her surgeon and will have another follow up next month. Member last saw primary care physician today.  History of diabetes- RNCM has been encouraging member to check her blood sugars. Member states she has been checking her blood sugar. Member reports her A1C level is 6.1.   Transition of care Program complete. Member would like to transition to disease management program for diabetes education/reinforcement.   Plan: transition to disease management, send update to primary care.  Thea Silversmith, RN, MSN, [place] Coordinator Cell: [phone]

## 2016-01-29 NOTE — Progress Notes (Signed)
   Subjective:    Patient ID: [name], female    DOB: 25-Apr-1956, 60 y.o.   MRN: [id number]  HPI 60 y/o female presents for follow up.  Leg Cramping - over the past 1-2 weeks, not relieved with gatorade, has been taking lasix as well (20 mg daily), increased leg swelling for one week (bilaterally).   Recent Colon Resection - eating during the day, decreased appetite at night, tolerating liquids, having mostly loose stools (3 per day), does report continued nausea (no emesis). Taking phenergan as needed (only needs once per week).   Insomnia - requests refill of Ambien, not using nightly, only a few times per week as needed  Social - nonsmoker   Review of Systems  Constitutional: Negative for chills and fatigue.  Respiratory: Negative for cough and shortness of breath.   Cardiovascular: Positive for leg swelling. Negative for chest pain.       Objective:   Physical Exam BP 138/90 mmHg  Pulse 85  Temp(Src) 98.1 F (36.7 C) (Oral)  Ht 5\' 4"  (1.626 m)  Wt 177 lb 9.6 oz (80.559 kg)  BMI 30.47 kg/m2  Gen: pleasant female, NAD Cardiac: RRR, S1 and S2 present Resp: CTAB, normal effort Ext: left inner thigh - tender non erythemetous cool nodule consistent with superficial thrombophlebitis; left calf 42 cm, right 43 cm, 2+ edema to knee bilaterally     Assessment & Plan:  Muscle cramps Bilateral leg cramping for one week. Clinically suspect due to diarrhea/malabsorption from recent colon resection.  -check BMP and Mg to rule out electrolyte disorder.   Superficial thrombophlebitis Nodule of left inner thigh most consistent with superficial thrombophlebitis. -warm compresses -check LE doppler to rule out DVT  Leg swelling Bilateral leg swelling. [place] multifactorial from venous insufficiency, history of anemia, and low protein. -check lab work today (BMP/CBC).  -encourage leg elevation  Insomnia Stable with prn Ambien. -refill of Ambien provided today.

## 2016-01-29 NOTE — Patient Instructions (Addendum)
It was nice to see you today.  Dr. Ree Kida will call you with your results.

## 2016-01-29 NOTE — Assessment & Plan Note (Signed)
Bilateral leg swelling. [place] multifactorial from venous insufficiency, history of anemia, and low protein. -check lab work today (BMP/CBC).  -encourage leg elevation

## 2016-01-29 NOTE — Assessment & Plan Note (Signed)
Stable with prn Ambien. -refill of Ambien provided today.

## 2016-01-29 NOTE — Assessment & Plan Note (Signed)
Bilateral leg cramping for one week. Clinically suspect due to diarrhea/malabsorption from recent colon resection.  -check BMP and Mg to rule out electrolyte disorder.

## 2016-01-29 NOTE — Assessment & Plan Note (Signed)
Nodule of left inner thigh most consistent with superficial thrombophlebitis. -warm compresses -check LE doppler to rule out DVT

## 2016-01-30 DIAGNOSIS — E8809 Other disorders of plasma-protein metabolism, not elsewhere classified: Secondary | ICD-10-CM | POA: Insufficient documentation

## 2016-01-30 NOTE — Telephone Encounter (Signed)
Discussed labs with patient. Electrolytes in normal range. Hemoglobin stable. Albumin low - recommended ensure 1-2 times per day. Leg cramping likely due to leg swelling. Also having some bruising. Platelets normal. On ASA which may be causing symptoms. Monitor and consider coagulation studies if continues to be an issue.

## 2016-01-30 NOTE — Assessment & Plan Note (Signed)
Recommended Ensure 1-2 times per day.

## 2016-02-02 ENCOUNTER — Inpatient Hospital Stay (HOSPITAL_COMMUNITY): Admission: RE | Admit: 2016-02-02 | Payer: Commercial Managed Care - HMO | Source: Ambulatory Visit

## 2016-02-03 ENCOUNTER — Other Ambulatory Visit: Payer: Self-pay

## 2016-02-03 NOTE — Patient Outreach (Signed)
[place] [facility]) Care Management  02/03/2016  [name] 01-Jun-1956 [id number]   Telephone call to patient for introductory call.  Patient receptive to call. Explained health coach role.  Patient voices no concerns.  Plan: RN Health Coach will contact patient within one month and patient agrees to next outreach.    Jone Baseman, RN, MSN [place] [phone]

## 2016-02-05 ENCOUNTER — Ambulatory Visit (HOSPITAL_COMMUNITY)
Admission: RE | Admit: 2016-02-05 | Discharge: 2016-02-05 | Disposition: A | Discharge status: Home / Self Care | Payer: Commercial Managed Care - HMO | Source: Ambulatory Visit | Attending: Family Medicine | Admitting: Family Medicine

## 2016-02-05 DIAGNOSIS — I10 Essential (primary) hypertension: Secondary | ICD-10-CM | POA: Insufficient documentation

## 2016-02-05 DIAGNOSIS — R609 Edema, unspecified: Secondary | ICD-10-CM | POA: Diagnosis not present

## 2016-02-05 DIAGNOSIS — Z6830 Body mass index (BMI) 30.0-30.9, adult: Secondary | ICD-10-CM | POA: Insufficient documentation

## 2016-02-05 DIAGNOSIS — I119 Hypertensive heart disease without heart failure: Secondary | ICD-10-CM | POA: Insufficient documentation

## 2016-02-05 DIAGNOSIS — E669 Obesity, unspecified: Secondary | ICD-10-CM | POA: Insufficient documentation

## 2016-02-06 ENCOUNTER — Encounter: Payer: Self-pay | Admitting: Family Medicine

## 2016-02-09 ENCOUNTER — Encounter (HOSPITAL_COMMUNITY): Payer: Commercial Managed Care - HMO

## 2016-02-11 ENCOUNTER — Telehealth: Payer: Self-pay | Admitting: Family Medicine

## 2016-02-11 ENCOUNTER — Other Ambulatory Visit: Payer: Self-pay | Admitting: Family Medicine

## 2016-02-11 DIAGNOSIS — M7989 Other specified soft tissue disorders: Secondary | ICD-10-CM

## 2016-02-11 MED ORDER — LISINOPRIL 40 MG PO TABS: 20.0000 mg | ORAL_TABLET | Freq: Every day | ORAL | Status: DC

## 2016-02-11 MED ORDER — FUROSEMIDE 20 MG PO TABS: 20.0000 mg | ORAL_TABLET | Freq: Every day | ORAL | Status: DC

## 2016-02-11 NOTE — Telephone Encounter (Signed)
Calling about the cramps to legs.  They are more painful than before.  Feet are swollen also.  Very tight!!!

## 2016-02-11 NOTE — Assessment & Plan Note (Signed)
Continued leg swelling despite compression stockings, elevation, and increased protein intake. -start lasix 20 mg daily.

## 2016-02-11 NOTE — Telephone Encounter (Signed)
Patient continues to have leg swelling and cramps. Wearing compression stockings during the day. Reports increased protein intake in diet. Will start lasix 20 mg daily.

## 2016-02-16 ENCOUNTER — Telehealth: Payer: Self-pay | Admitting: Family Medicine

## 2016-02-16 NOTE — Telephone Encounter (Signed)
Spoke to [name]. Still having leg swelling with lasix 20 mg daily. Not urinating frequently like she thought she would. Will increase dose to 40 mg daily.

## 2016-02-16 NOTE — Telephone Encounter (Signed)
Pt is calling because her feet and legs are still hurting. She wanted to know if the doctor would let her increase the dosage of her medication. She didn't remember what the name was and was going to call back, plus she said that Dr. Ree Kida would know which medication it was. jw

## 2016-02-23 ENCOUNTER — Other Ambulatory Visit: Payer: Self-pay

## 2016-02-23 NOTE — Patient Outreach (Signed)
[facility]) Care Management  [place]  02/23/2016   [name] 04-15-1956 [id number]  Subjective: Telephone call to patient for initial assessment. Patient reports she is doing ok.  Patient reports however having some problems with swelling to her feet and legs.  She reports her lasix was increased and test for DVT done.  Patient reports some improvement and that she is wearing support hose to help.  She reports she has a follow up with her physician on 02-27-16. Patient reports she is taking her blood sugars twice a day.  Patient not currently on diabetic medication but is using diet to control her sugars.  Discussed with patient importance of maintaining low carbohydrate diet in order to control sugars.  She verbalized understanding.  No concerns.   Objective:   Encounter Medications:  Outpatient Encounter Prescriptions as of 02/23/2016  Medication Sig Note  . aspirin 81 MG tablet Take 81 mg by mouth daily.   Marland Kitchen atorvastatin (LIPITOR) 40 MG tablet TAKE ONE TABLET EACH DAY   . calcium carbonate (OS-CAL) 600 MG TABS tablet Take 1 tablet (600 mg total) by mouth 2 (two) times daily with a meal.   . Cetirizine HCl (ZYRTEC ALLERGY) 10 MG CAPS Take 1 capsule (10 mg total) by mouth daily. (Patient taking differently: Take 1 capsule by mouth at bedtime. ) 01/01/2016: Taking as needed during allergy season.  . Cholecalciferol 1000 units TBDP Take 1,000 mg by mouth 1 day or 1 dose. 01/01/2016: Taking 5000 IU daily.  Marland Kitchen DOK 100 MG capsule TAKE ONE CAPSULE TWICE A DAY AS NEEDED FOR CONSTIPATION 01/01/2016: Taking one capsule daily  . esomeprazole (NEXIUM) 40 MG capsule Take 1 tab every morning.   . ferrous sulfate 220 (44 FE) MG/5ML solution Take 5 mLs (220 mg total) by mouth 2 (two) times daily with a meal.   . fluticasone (FLONASE) 50 MCG/ACT nasal spray Place 2 sprays into both nostrils at bedtime.   . hydrochlorothiazide (MICROZIDE) 12.5 MG capsule TAKE ONE CAPSULE EACH DAY   .  lisinopril (PRINIVIL,ZESTRIL) 40 MG tablet Take 0.5 tablets (20 mg total) by mouth daily.   . Multiple Vitamin (MULTIVITAMIN) tablet Take 1 tablet by mouth daily.   Marland Kitchen oxyCODONE (OXY IR/ROXICODONE) 5 MG immediate release tablet Take 1 tablet (5 mg total) by mouth every 6 (six) hours as needed for severe pain.   . polyethylene glycol powder (GLYCOLAX/MIRALAX) powder TAKE 17G BY MOUTH TWICE DAILY   . promethazine (PHENERGAN) 12.5 MG tablet Take 1 tablet (12.5 mg total) by mouth every 8 (eight) hours as needed for nausea or vomiting.   . sucralfate (CARAFATE) 1 GM/10ML suspension Take 10 mLs (1 g total) by mouth 4 (four) times daily -  with meals and at bedtime.   . vitamin B-12 (CYANOCOBALAMIN) 1000 MCG tablet Take 1,000 mcg by mouth daily.   . vitamin C (ASCORBIC ACID) 250 MG tablet Take 250 mg by mouth daily.   . vitamin E 400 UNIT capsule Take 400 Units by mouth daily.   Marland Kitchen zolpidem (AMBIEN) 10 MG tablet TAKE ONE TABLET AT BEDTIME AS NEEDED FOR SLEEP   . furosemide (LASIX) 20 MG tablet Take 1 tablet (20 mg total) by mouth daily. 02/23/2016: Taking 40 mg daily   . pneumococcal 23 valent vaccine (PNU-IMMUNE) 25 MCG/0.5ML injection Inject 0.5 mLs into the muscle. Reported on 02/23/2016 02/11/2016: Received from: [facility]   No facility-administered encounter medications on file as of 02/23/2016.  Functional Status:  In your present state of health, do you have any difficulty performing the following activities: 02/23/2016 01/01/2016  Hearing? N N  Vision? N N  Difficulty concentrating or making decisions? N N  Walking or climbing stairs? N Y  Dressing or bathing? N N  Doing errands, shopping? N Y  Conservation officer, nature and eating ? N N  Using the Toilet? N N  In the past six months, have you accidently leaked urine? N N  Do you have problems with loss of bowel control? N Y  Managing your Medications? N N  Managing your Finances? N N  Housekeeping or managing your Housekeeping?  N N    Fall/Depression Screening: PHQ 2/9 Scores 02/23/2016 01/29/2016 01/01/2016 12/25/2015 11/21/2015 10/13/2015 09/18/2015  PHQ - 2 Score 0 0 0 0 0 0 0    Assessment: Patient will benefit from health coach outreach for continued disease management for diabetic self-management.   Plan:  [facility] CM Care Plan Problem One        Most Recent Value   Care Plan Problem One  Diabetes education/reiforcement   Role Documenting the Problem One  Health Coach   Care Plan for Problem One  Active   THN Long Term Goal (31-90 days)  Patient will maintain A1c less than 7 within 90 days.   THN Long Term Goal Start Date  02/23/16   Interventions for Problem One [place] discussed with patient A1c and goal of keeping less than 7.   THN CM Short Term Goal #1 (0-30 days)  Patient will maintain diet of low carbhydrates within 30 days.   THN CM Short Term Goal #1 Start Date  02/23/16   Interventions for Short Term Goal #1  RN Health Coach discussed with patient importance of maintaining low carbohydrate diet.     THN CM Short Term Goal #2 (0-30 days)  Patient will continue to monitor blood sugars twice a day within 30 days.   THN CM Short Term Goal #2 Start Date  02/23/16   Interventions for Short Term Goal #2  [place] discussed importance of monitoring blood sugar even though she not on any medications.       RN Health Coach will provide ongoing education for patient on diabetes through phone calls and sending printed information to patient for further discussion.  RN Health Coach will sent  welcome letter.   RN Health Coach will send initial barriers letter, assessment, and care plan to primary care physician.  RN Health Coach will contact patient within one month and patient agrees to next contact.   Jone Baseman, RN, MSN [place] [phone]

## 2016-02-27 ENCOUNTER — Encounter: Payer: Self-pay | Admitting: Family Medicine

## 2016-02-27 ENCOUNTER — Ambulatory Visit (INDEPENDENT_AMBULATORY_CARE_PROVIDER_SITE_OTHER): Payer: Commercial Managed Care - HMO | Admitting: Family Medicine

## 2016-02-27 VITALS — BP 122/81 | HR 90 | Temp 98.1°F | Ht 64.0 in | Wt 175.1 lb

## 2016-02-27 DIAGNOSIS — R252 Cramp and spasm: Secondary | ICD-10-CM

## 2016-02-27 DIAGNOSIS — K5731 Diverticulosis of large intestine without perforation or abscess with bleeding: Secondary | ICD-10-CM

## 2016-02-27 DIAGNOSIS — I10 Essential (primary) hypertension: Secondary | ICD-10-CM | POA: Diagnosis not present

## 2016-02-27 DIAGNOSIS — M7989 Other specified soft tissue disorders: Secondary | ICD-10-CM | POA: Diagnosis not present

## 2016-02-27 HISTORY — DX: Cramp and spasm: R25.2

## 2016-02-27 LAB — COMPLETE METABOLIC PANEL WITH GFR
ALT: 30 U/L — ABNORMAL HIGH (ref 6–29)
AST: 28 U/L (ref 10–35)
Albumin: 3.4 g/dL — ABNORMAL LOW (ref 3.6–5.1)
Alkaline Phosphatase: 66 U/L (ref 33–130)
BUN: 16 mg/dL (ref 7–25)
CO2: 25 mmol/L (ref 20–31)
Calcium: 8.6 mg/dL (ref 8.6–10.4)
Chloride: 110 mmol/L (ref 98–110)
Creat: 0.65 mg/dL (ref 0.50–1.05)
GFR, Est African American: >89 mL/min (ref >=60)
GFR, Est Non African American: >89 mL/min (ref >=60)
Glucose, Bld: 77 mg/dL (ref 65–99)
Potassium: 3.8 mmol/L (ref 3.5–5.3)
Sodium: 144 mmol/L (ref 135–146)
Total Bilirubin: 0.3 mg/dL (ref 0.2–1.2)
Total Protein: 6.4 g/dL (ref 6.1–8.1)

## 2016-02-27 LAB — TSH: TSH: 0.57 mIU/L

## 2016-02-27 LAB — HEPATITIS C ANTIBODY: HCV Ab: NEGATIVE

## 2016-02-27 MED ORDER — LISINOPRIL 20 MG PO TABS: 20.0000 mg | ORAL_TABLET | Freq: Every day | ORAL | Status: DC

## 2016-02-27 MED ORDER — DOCUSATE SODIUM 100 MG PO CAPS: ORAL_CAPSULE | ORAL | Status: DC

## 2016-02-27 MED ORDER — FUROSEMIDE 20 MG PO TABS: 40.0000 mg | ORAL_TABLET | Freq: Every day | ORAL | Status: DC

## 2016-02-27 MED ORDER — ATORVASTATIN CALCIUM 40 MG PO TABS: ORAL_TABLET | ORAL | Status: DC

## 2016-02-27 NOTE — Assessment & Plan Note (Signed)
Controlled. Having some symptoms consistent with orthostatic hypotension (orthostatic vital signs negative). May be a side affect of Lasix. -continue HCTZ/Lisinopril/Lasix - if persists consider decreasing regimen -prescription for compression stockings provided

## 2016-02-27 NOTE — Progress Notes (Signed)
   Subjective:    Patient ID: [name], female    DOB: 11/12/1955, 60 y.o.   MRN: [id number]  HPI 60 y/o female presents for follow up of leg swelling.   Leg swelling - recently started on lasix 40 mg daily, improved from previous, still has some swelling, no orthopnea, no chest pain, no sob, occasionally dizzy when standing up (lasts 5- 10 minutes, resolves spontaneously). Wearing compression stockings (old pair).  Cramping - patient reports continued intermittent cramping of legs and hands, improves with massaging affected area.    HTN - taking HCTZ/Lisinopril, no chest pain  Social - nonsmoker   Review of Systems  Constitutional: Negative for fever, chills and fatigue.  Respiratory: Negative for cough and shortness of breath.   Cardiovascular: Positive for leg swelling. Negative for chest pain.       Objective:   Physical Exam BP 122/81 mmHg  Pulse 90  Temp(Src) 98.1 F (36.7 C) (Oral)  Ht 5\' 4"  (1.626 m)  Wt 175 lb 1.6 oz (79.425 kg)  BMI 30.04 kg/m2  Orthostatics negative  Gen: pleasant female, NAD Cardiac: RRR, S1 and S2 present, no murmur,  Resp: CTAB, normal effort LE: 2+ edema to bilateral knees  Foot exam - no deformities, warm, 2+ DP pulses, intact to monofilament testing  Reviewed labs from 01/29/16 - Renal and Liver Function normal, albumin low at 3.0, hemoglobin stable at 10.4 Last TSH in 2014 was in normal range.      Assessment & Plan:  Leg swelling Improved, Due to low protein levels and venous insufficiency. No cardiac signs/symptoms. Recent renal/liver testing normal. Recent hemoglobin low normal.  -continue lasix 40 mg daily -check CMP/TSH  HTN, goal below 140/90 Controlled. Having some symptoms consistent with orthostatic hypotension (orthostatic vital signs negative). May be a side affect of Lasix. -continue HCTZ/Lisinopril/Lasix - if persists consider decreasing regimen -prescription for compression stockings provided  Cramping of  hands Intermittent cramping of legs/hands. (hands are worse). Likely due to salt/fluid shifts related to diuretic.  -reasssurance given -check CMP to monitor electrolytes

## 2016-02-27 NOTE — Assessment & Plan Note (Signed)
Improved, Due to low protein levels and venous insufficiency. No cardiac signs/symptoms. Recent renal/liver testing normal. Recent hemoglobin low normal.  -continue lasix 40 mg daily -check CMP/TSH

## 2016-02-27 NOTE — Patient Instructions (Addendum)
It was nice to see you today.  Leg swelling - I am glad to see that this has improved, continue Lasix 40 mg daily, you have been given a prescription for compression stockings (put on first thing in the AM and wear throughout the day).  Cramping - likely a result of fluctuations in salt levels in the blood, I suspect this may improve over time as we improve the swelling that you are having.   Return in one month for check on swelling and diabetes check.

## 2016-02-27 NOTE — Assessment & Plan Note (Signed)
Intermittent cramping of legs/hands. (hands are worse). Likely due to salt/fluid shifts related to diuretic.  -reasssurance given -check CMP to monitor electrolytes

## 2016-03-01 ENCOUNTER — Other Ambulatory Visit: Payer: Self-pay | Admitting: Family Medicine

## 2016-03-02 ENCOUNTER — Telehealth: Payer: Self-pay | Admitting: Family Medicine

## 2016-03-02 DIAGNOSIS — K922 Gastrointestinal hemorrhage, unspecified: Secondary | ICD-10-CM | POA: Diagnosis not present

## 2016-03-02 MED ORDER — FLUTICASONE PROPIONATE 50 MCG/ACT NA SUSP
2.0000 | Freq: Every day | NASAL | Status: DC
Start: 2016-03-02 — End: 2017-08-15

## 2016-03-02 NOTE — Telephone Encounter (Signed)
Discussed negative HBC, normal TSH, normal CMP except for low protein.

## 2016-03-06 ENCOUNTER — Encounter (HOSPITAL_COMMUNITY): Payer: Self-pay | Admitting: Emergency Medicine

## 2016-03-06 ENCOUNTER — Emergency Department (HOSPITAL_COMMUNITY)
Admission: EM | Admit: 2016-03-06 | Discharge: 2016-03-07 | Disposition: A | Discharge status: Home / Self Care | Payer: Commercial Managed Care - HMO | Attending: Emergency Medicine | Admitting: Emergency Medicine

## 2016-03-06 DIAGNOSIS — Z7982 Long term (current) use of aspirin: Secondary | ICD-10-CM | POA: Diagnosis not present

## 2016-03-06 DIAGNOSIS — S20219A Contusion of unspecified front wall of thorax, initial encounter: Secondary | ICD-10-CM | POA: Diagnosis not present

## 2016-03-06 DIAGNOSIS — Y9389 Activity, other specified: Secondary | ICD-10-CM | POA: Diagnosis not present

## 2016-03-06 DIAGNOSIS — M1990 Unspecified osteoarthritis, unspecified site: Secondary | ICD-10-CM | POA: Insufficient documentation

## 2016-03-06 DIAGNOSIS — I10 Essential (primary) hypertension: Secondary | ICD-10-CM | POA: Diagnosis not present

## 2016-03-06 DIAGNOSIS — S20211A Contusion of right front wall of thorax, initial encounter: Secondary | ICD-10-CM | POA: Diagnosis not present

## 2016-03-06 DIAGNOSIS — S29001A Unspecified injury of muscle and tendon of front wall of thorax, initial encounter: Secondary | ICD-10-CM | POA: Diagnosis present

## 2016-03-06 DIAGNOSIS — D649 Anemia, unspecified: Secondary | ICD-10-CM | POA: Diagnosis not present

## 2016-03-06 DIAGNOSIS — Y998 Other external cause status: Secondary | ICD-10-CM | POA: Diagnosis not present

## 2016-03-06 DIAGNOSIS — Z8673 Personal history of transient ischemic attack (TIA), and cerebral infarction without residual deficits: Secondary | ICD-10-CM | POA: Insufficient documentation

## 2016-03-06 DIAGNOSIS — E7800 Pure hypercholesterolemia, unspecified: Secondary | ICD-10-CM | POA: Diagnosis not present

## 2016-03-06 DIAGNOSIS — K219 Gastro-esophageal reflux disease without esophagitis: Secondary | ICD-10-CM | POA: Diagnosis not present

## 2016-03-06 DIAGNOSIS — S161XXA Strain of muscle, fascia and tendon at neck level, initial encounter: Secondary | ICD-10-CM

## 2016-03-06 DIAGNOSIS — Y92410 Unspecified street and highway as the place of occurrence of the external cause: Secondary | ICD-10-CM | POA: Diagnosis not present

## 2016-03-06 DIAGNOSIS — Z79899 Other long term (current) drug therapy: Secondary | ICD-10-CM | POA: Diagnosis not present

## 2016-03-06 MED ORDER — CYCLOBENZAPRINE HCL 10 MG PO TABS: 10.0000 mg | ORAL_TABLET | Freq: Three times a day (TID) | ORAL | Status: DC | PRN

## 2016-03-06 NOTE — Discharge Instructions (Signed)
Cervical Sprain A cervical sprain is an injury in the neck in which the strong, fibrous tissues (ligaments) that connect your neck bones stretch or tear. Cervical sprains can range from mild to severe. Severe cervical sprains can cause the neck vertebrae to be unstable. This can lead to damage of the spinal cord and can result in serious nervous system problems. The amount of time it takes for a cervical sprain to get better depends on the cause and extent of the injury. Most cervical sprains heal in 1 to 3 weeks. CAUSES  Severe cervical sprains may be caused by:   Contact sport injuries (such as from football, rugby, wrestling, hockey, auto racing, gymnastics, diving, martial arts, or boxing).   Motor vehicle collisions.   Whiplash injuries. This is an injury from a sudden forward and backward whipping movement of the head and neck.  Falls.  Mild cervical sprains may be caused by:   Being in an awkward position, such as while cradling a telephone between your ear and shoulder.   Sitting in a chair that does not offer proper support.   Working at a poorly Landscape architect station.   Looking up or down for long periods of time.  SYMPTOMS   Pain, soreness, stiffness, or a burning sensation in the front, back, or sides of the neck. This discomfort may develop immediately after the injury or slowly, 24 hours or more after the injury.   Pain or tenderness directly in the middle of the back of the neck.   Shoulder or upper back pain.   Limited ability to move the neck.   Headache.   Dizziness.   Weakness, numbness, or tingling in the hands or arms.   Muscle spasms.   Difficulty swallowing or chewing.   Tenderness and swelling of the neck.  DIAGNOSIS  Most of the time your health care provider can diagnose a cervical sprain by taking your history and doing a physical exam. Your health care provider will ask about previous neck injuries and any known neck  problems, such as arthritis in the neck. X-rays may be taken to find out if there are any other problems, such as with the bones of the neck. Other tests, such as a CT scan or MRI, may also be needed.  TREATMENT  Treatment depends on the severity of the cervical sprain. Mild sprains can be treated with rest, keeping the neck in place (immobilization), and pain medicines. Severe cervical sprains are immediately immobilized. Further treatment is done to help with pain, muscle spasms, and other symptoms and may include:  Medicines, such as pain relievers, numbing medicines, or muscle relaxants.   Physical therapy. This may involve stretching exercises, strengthening exercises, and posture training. Exercises and improved posture can help stabilize the neck, strengthen muscles, and help stop symptoms from returning.  HOME CARE INSTRUCTIONS   Put ice on the injured area.   Put ice in a plastic bag.   Place a towel between your skin and the bag.   Leave the ice on for 15-20 minutes, 3-4 times a day.   If your injury was severe, you may have been given a cervical collar to wear. A cervical collar is a two-piece collar designed to keep your neck from moving while it heals.  Do not remove the collar unless instructed by your health care provider.  If you have long hair, keep it outside of the collar.  Ask your health care provider before making any adjustments to your collar. Minor  adjustments may be required over time to improve comfort and reduce pressure on your chin or on the back of your head.  Ifyou are allowed to remove the collar for cleaning or bathing, follow your health care provider's instructions on how to do so safely.  Keep your collar clean by wiping it with mild soap and water and drying it completely. If the collar you have been given includes removable pads, remove them every 1-2 days and hand wash them with soap and water. Allow them to air dry. They should be completely  dry before you wear them in the collar.  If you are allowed to remove the collar for cleaning and bathing, wash and dry the skin of your neck. Check your skin for irritation or sores. If you see any, tell your health care provider.  Do not drive while wearing the collar.   Only take over-the-counter or prescription medicines for pain, discomfort, or fever as directed by your health care provider.   Keep all follow-up appointments as directed by your health care provider.   Keep all physical therapy appointments as directed by your health care provider.   Make any needed adjustments to your workstation to promote good posture.   Avoid positions and activities that make your symptoms worse.   Warm up and stretch before being active to help prevent problems.  SEEK MEDICAL CARE IF:   Your pain is not controlled with medicine.   You are unable to decrease your pain medicine over time as planned.   Your activity level is not improving as expected.  SEEK IMMEDIATE MEDICAL CARE IF:   You develop any bleeding.  You develop stomach upset.  You have signs of an allergic reaction to your medicine.   Your symptoms get worse.   You develop new, unexplained symptoms.   You have numbness, tingling, weakness, or paralysis in any part of your body.  MAKE SURE YOU:   Understand these instructions.  Will watch your condition.  Will get help right away if you are not doing well or get worse.   This information is not intended to replace advice given to you by your health care provider. Make sure you discuss any questions you have with your health care provider.   Document Released: 08/15/2007 Document Revised: 10/23/2013 Document Reviewed: 04/25/2013 Elsevier Interactive Patient Education 2016 [place].  Blunt Chest Trauma Blunt chest trauma is an injury caused by a blow to the chest. These chest injuries can be very painful. Blunt chest trauma often results in  bruised or broken (fractured) ribs. Most cases of bruised and fractured ribs from blunt chest traumas get better after 1 to 3 weeks of rest and pain medicine. Often, the soft tissue in the chest wall is also injured, causing pain and bruising. Internal organs, such as the heart and lungs, may also be injured. Blunt chest trauma can lead to serious medical problems. This injury requires immediate medical care. CAUSES   Motor vehicle collisions.  Falls.  Physical violence.  Sports injuries. SYMPTOMS   Chest pain. The pain may be worse when you move or breathe deeply.  Shortness of breath.  Lightheadedness.  Bruising.  Tenderness.  Swelling. DIAGNOSIS  Your caregiver will do a physical exam. X-rays may be taken to look for fractures. However, minor rib fractures may not show up on X-rays until a few days after the injury. If a more serious injury is suspected, further imaging tests may be done. This may include ultrasounds, computed  tomography (CT) scans, or magnetic resonance imaging (MRI). TREATMENT  Treatment depends on the severity of your injury. Your caregiver may prescribe pain medicines and deep breathing exercises. HOME CARE INSTRUCTIONS  Limit your activities until you can move around without much pain.  Do not do any strenuous work until your injury is healed.  Put ice on the injured area.  Put ice in a plastic bag.  Place a towel between your skin and the bag.  Leave the ice on for 15-20 minutes, 03-04 times a day.  You may wear a rib belt as directed by your caregiver to reduce pain.  Practice deep breathing as directed by your caregiver to keep your lungs clear.  Only take over-the-counter or prescription medicines for pain, fever, or discomfort as directed by your caregiver. SEEK IMMEDIATE MEDICAL CARE IF:   You have increasing pain or shortness of breath.  You cough up blood.  You have nausea, vomiting, or abdominal pain.  You have a fever.  You  feel dizzy, weak, or you faint. MAKE SURE YOU:  Understand these instructions.  Will watch your condition.  Will get help right away if you are not doing well or get worse.   This information is not intended to replace advice given to you by your health care provider. Make sure you discuss any questions you have with your health care provider.   Document Released: 11/25/2004 Document Revised: 11/08/2014 Document Reviewed: 04/16/2015 Elsevier Interactive Patient Education 2016 [employer].  Technical brewer It is common to have multiple bruises and sore muscles after a motor vehicle collision (MVC). These tend to feel worse for the first 24 hours. You may have the most stiffness and soreness over the first several hours. You may also feel worse when you wake up the first morning after your collision. After this point, you will usually begin to improve with each day. The speed of improvement often depends on the severity of the collision, the number of injuries, and the location and nature of these injuries. HOME CARE INSTRUCTIONS  Put ice on the injured area.  Put ice in a plastic bag.  Place a towel between your skin and the bag.  Leave the ice on for 15-20 minutes, 3-4 times a day, or as directed by your health care provider.  Drink enough fluids to keep your urine clear or pale yellow. Do not drink alcohol.  Take a warm shower or bath once or twice a day. This will increase blood flow to sore muscles.  You may return to activities as directed by your caregiver. Be careful when lifting, as this may aggravate neck or back pain.  Only take over-the-counter or prescription medicines for pain, discomfort, or fever as directed by your caregiver. Do not use aspirin. This may increase bruising and bleeding. SEEK IMMEDIATE MEDICAL CARE IF:  You have numbness, tingling, or weakness in the arms or legs.  You develop severe headaches not relieved with medicine.  You have severe  neck pain, especially tenderness in the middle of the back of your neck.  You have changes in bowel or bladder control.  There is increasing pain in any area of the body.  You have shortness of breath, light-headedness, dizziness, or fainting.  You have chest pain.  You feel sick to your stomach (nauseous), throw up (vomit), or sweat.  You have increasing abdominal discomfort.  There is blood in your urine, stool, or vomit.  You have pain in your shoulder (shoulder strap areas).  You feel your symptoms are getting worse. MAKE SURE YOU:  Understand these instructions.  Will watch your condition.  Will get help right away if you are not doing well or get worse.   This information is not intended to replace advice given to you by your health care provider. Make sure you discuss any questions you have with your health care provider.   Document Released: 10/18/2005 Document Revised: 11/08/2014 Document Reviewed: 03/17/2011 Elsevier Interactive Patient Education [employer].
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
## 2016-03-06 NOTE — ED Notes (Signed)
Pt stable, ambulatory, states understanding of discharge instructions 

## 2016-03-08 ENCOUNTER — Emergency Department (HOSPITAL_COMMUNITY): Payer: Commercial Managed Care - HMO

## 2016-03-08 ENCOUNTER — Encounter (HOSPITAL_COMMUNITY): Payer: Self-pay

## 2016-03-08 ENCOUNTER — Emergency Department (HOSPITAL_COMMUNITY)
Admission: EM | Admit: 2016-03-08 | Discharge: 2016-03-08 | Disposition: A | Discharge status: Home / Self Care | Payer: Commercial Managed Care - HMO | Attending: Emergency Medicine | Admitting: Emergency Medicine

## 2016-03-08 DIAGNOSIS — M1990 Unspecified osteoarthritis, unspecified site: Secondary | ICD-10-CM | POA: Insufficient documentation

## 2016-03-08 DIAGNOSIS — S199XXA Unspecified injury of neck, initial encounter: Secondary | ICD-10-CM | POA: Diagnosis not present

## 2016-03-08 DIAGNOSIS — I10 Essential (primary) hypertension: Secondary | ICD-10-CM | POA: Diagnosis not present

## 2016-03-08 DIAGNOSIS — Z7982 Long term (current) use of aspirin: Secondary | ICD-10-CM | POA: Insufficient documentation

## 2016-03-08 DIAGNOSIS — Z79899 Other long term (current) drug therapy: Secondary | ICD-10-CM | POA: Insufficient documentation

## 2016-03-08 DIAGNOSIS — S20219D Contusion of unspecified front wall of thorax, subsequent encounter: Secondary | ICD-10-CM

## 2016-03-08 DIAGNOSIS — E7800 Pure hypercholesterolemia, unspecified: Secondary | ICD-10-CM | POA: Insufficient documentation

## 2016-03-08 DIAGNOSIS — S40012A Contusion of left shoulder, initial encounter: Secondary | ICD-10-CM | POA: Diagnosis not present

## 2016-03-08 DIAGNOSIS — S161XXA Strain of muscle, fascia and tendon at neck level, initial encounter: Secondary | ICD-10-CM | POA: Diagnosis not present

## 2016-03-08 DIAGNOSIS — R079 Chest pain, unspecified: Secondary | ICD-10-CM | POA: Diagnosis not present

## 2016-03-08 DIAGNOSIS — D649 Anemia, unspecified: Secondary | ICD-10-CM | POA: Insufficient documentation

## 2016-03-08 DIAGNOSIS — S161XXD Strain of muscle, fascia and tendon at neck level, subsequent encounter: Secondary | ICD-10-CM | POA: Diagnosis not present

## 2016-03-08 DIAGNOSIS — S29001D Unspecified injury of muscle and tendon of front wall of thorax, subsequent encounter: Secondary | ICD-10-CM | POA: Diagnosis present

## 2016-03-08 DIAGNOSIS — S4991XA Unspecified injury of right shoulder and upper arm, initial encounter: Secondary | ICD-10-CM | POA: Diagnosis not present

## 2016-03-08 DIAGNOSIS — R51 Headache: Secondary | ICD-10-CM | POA: Diagnosis not present

## 2016-03-08 DIAGNOSIS — K219 Gastro-esophageal reflux disease without esophagitis: Secondary | ICD-10-CM | POA: Diagnosis not present

## 2016-03-08 DIAGNOSIS — S299XXA Unspecified injury of thorax, initial encounter: Secondary | ICD-10-CM | POA: Diagnosis not present

## 2016-03-08 DIAGNOSIS — S20219A Contusion of unspecified front wall of thorax, initial encounter: Secondary | ICD-10-CM | POA: Diagnosis not present

## 2016-03-08 DIAGNOSIS — M25511 Pain in right shoulder: Secondary | ICD-10-CM | POA: Diagnosis not present

## 2016-03-08 DIAGNOSIS — S40011D Contusion of right shoulder, subsequent encounter: Secondary | ICD-10-CM | POA: Diagnosis not present

## 2016-03-08 DIAGNOSIS — M542 Cervicalgia: Secondary | ICD-10-CM | POA: Diagnosis not present

## 2016-03-08 DIAGNOSIS — S0990XA Unspecified injury of head, initial encounter: Secondary | ICD-10-CM | POA: Diagnosis not present

## 2016-03-08 MED ORDER — TRAMADOL HCL 50 MG PO TABS: 50.0000 mg | ORAL_TABLET | Freq: Four times a day (QID) | ORAL | Status: DC | PRN
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
## 2016-03-08 NOTE — ED Notes (Signed)
Dr. Pollina at bedside   

## 2016-03-08 NOTE — Discharge Instructions (Signed)
Cervical Sprain A cervical sprain is when the tissues (ligaments) that hold the neck bones in place stretch or tear. HOME CARE   Put ice on the injured area.  Put ice in a plastic bag.  Place a towel between your skin and the bag.  Leave the ice on for 15-20 minutes, 3-4 times a day.  You may have been given a collar to wear. This collar keeps your neck from moving while you heal.  Do not take the collar off unless told by your doctor.  If you have long hair, keep it outside of the collar.  Ask your doctor before changing the position of your collar. You may need to change its position over time to make it more comfortable.  If you are allowed to take off the collar for cleaning or bathing, follow your doctor's instructions on how to do it safely.  Keep your collar clean by wiping it with mild soap and water. Dry it completely. If the collar has removable pads, remove them every 1-2 days to hand wash them with soap and water. Allow them to air dry. They should be dry before you wear them in the collar.  Do not drive while wearing the collar.  Only take medicine as told by your doctor.  Keep all doctor visits as told.  Keep all physical therapy visits as told.  Adjust your work station so that you have good posture while you work.  Avoid positions and activities that make your problems worse.  Warm up and stretch before being active. GET HELP IF:  Your pain is not controlled with medicine.  You cannot take less pain medicine over time as planned.  Your activity level does not improve as expected. GET HELP RIGHT AWAY IF:   You are bleeding.  Your stomach is upset.  You have an allergic reaction to your medicine.  You develop new problems that you cannot explain.  You lose feeling (become numb) or you cannot move any part of your body (paralysis).  You have tingling or weakness in any part of your body.  Your symptoms get worse. Symptoms include:  Pain,  soreness, stiffness, puffiness (swelling), or a burning feeling in your neck.  Pain when your neck is touched.  Shoulder or upper back pain.  Limited ability to move your neck.  Headache.  Dizziness.  Your hands or arms feel week, lose feeling, or tingle.  Muscle spasms.  Difficulty swallowing or chewing. MAKE SURE YOU:   Understand these instructions.  Will watch your condition.  Will get help right away if you are not doing well or get worse.   This information is not intended to replace advice given to you by your health care provider. Make sure you discuss any questions you have with your health care provider.   Document Released: 04/05/2008 Document Revised: 06/20/2013 Document Reviewed: 04/25/2013 Elsevier Interactive Patient Education 2016 [place]. Chest Contusion A chest contusion is a deep bruise on your chest area. Contusions are the result of an injury that caused bleeding under the skin. A chest contusion may involve bruising of the skin, muscles, or ribs. The contusion may turn blue, purple, or yellow. Minor injuries will give you a painless contusion, but more severe contusions may stay painful and swollen for a few weeks. CAUSES  A contusion is usually caused by a blow, trauma, or direct force to an area of the body. SYMPTOMS   Swelling and redness of the injured area.  Discoloration of  the injured area.  Tenderness and soreness of the injured area.  Pain. DIAGNOSIS  The diagnosis can be made by taking a history and performing a physical exam. An X-ray, CT scan, or MRI may be needed to determine if there were any associated injuries, such as broken bones (fractures) or internal injuries. TREATMENT  Often, the best treatment for a chest contusion is resting, icing, and applying cold compresses to the injured area. Deep breathing exercises may be recommended to reduce the risk of pneumonia. Over-the-counter medicines may also be recommended for pain  control. HOME CARE INSTRUCTIONS   Put ice on the injured area.  Put ice in a plastic bag.  Place a towel between your skin and the bag.  Leave the ice on for 15-20 minutes, 03-04 times a day.  Only take over-the-counter or prescription medicines as directed by your caregiver. Your caregiver may recommend avoiding anti-inflammatory medicines (aspirin, ibuprofen, and naproxen) for 48 hours because these medicines may increase bruising.  Rest the injured area.  Perform deep-breathing exercises as directed by your caregiver.  Stop smoking if you smoke.  Do not lift objects over 5 pounds (2.3 kg) for 3 days or longer if recommended by your caregiver. SEEK IMMEDIATE MEDICAL CARE IF:   You have increased bruising or swelling.  You have pain that is getting worse.  You have difficulty breathing.  You have dizziness, weakness, or fainting.  You have blood in your urine or stool.  You cough up or vomit blood.  Your swelling or pain is not relieved with medicines. MAKE SURE YOU:   Understand these instructions.  Will watch your condition.  Will get help right away if you are not doing well or get worse.   This information is not intended to replace advice given to you by your health care provider. Make sure you discuss any questions you have with your health care provider.   Document Released: 07/13/2001 Document Revised: 07/12/2012 Document Reviewed: 04/10/2012 Elsevier Interactive Patient Education [employer].
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
## 2016-03-12 ENCOUNTER — Telehealth: Payer: Self-pay | Admitting: *Deleted

## 2016-03-12 DIAGNOSIS — M1990 Unspecified osteoarthritis, unspecified site: Secondary | ICD-10-CM

## 2016-03-12 DIAGNOSIS — M170 Bilateral primary osteoarthritis of knee: Secondary | ICD-10-CM

## 2016-03-12 HISTORY — DX: Bilateral primary osteoarthritis of knee: M17.0
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
## 2016-03-12 NOTE — Telephone Encounter (Signed)
Referral to orthopedic surgery placed in EPIC.

## 2016-03-15 NOTE — Telephone Encounter (Signed)
[facility] referral completed. Auth # [id number]. Good from 03/15/16 to 09/11/16. Requested the [employer] send a copy for referral to [employer]. Once this is received at [facility], they will contact patient to schedule.

## 2016-03-19 ENCOUNTER — Other Ambulatory Visit: Payer: Self-pay

## 2016-03-19 NOTE — Patient Outreach (Signed)
[place] [facility]) Care Management  03/19/2016  IVELISE DISANTI 01/25/1956 [id number]   Telephone call to patient for monthly outreach call.  No answer.  HIPAA compliant voice message left.    Plan: RN Health Coach will attempt within 1-2 weeks.    Jone Baseman, RN, MSN [place] [phone]
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
## 2016-03-25 DIAGNOSIS — S139XXA Sprain of joints and ligaments of unspecified parts of neck, initial encounter: Secondary | ICD-10-CM | POA: Diagnosis not present

## 2016-03-25 DIAGNOSIS — M25562 Pain in left knee: Secondary | ICD-10-CM | POA: Diagnosis not present

## 2016-03-25 DIAGNOSIS — S335XXA Sprain of ligaments of lumbar spine, initial encounter: Secondary | ICD-10-CM | POA: Diagnosis not present

## 2016-03-25 DIAGNOSIS — M25561 Pain in right knee: Secondary | ICD-10-CM | POA: Diagnosis not present

## 2016-03-26 ENCOUNTER — Encounter: Payer: Self-pay | Admitting: Family Medicine

## 2016-03-26 ENCOUNTER — Ambulatory Visit (INDEPENDENT_AMBULATORY_CARE_PROVIDER_SITE_OTHER): Payer: Commercial Managed Care - HMO | Admitting: Family Medicine

## 2016-03-26 VITALS — BP 121/76 | HR 98 | Temp 98.3°F | Ht 64.0 in | Wt 176.6 lb

## 2016-03-26 DIAGNOSIS — M25561 Pain in right knee: Secondary | ICD-10-CM

## 2016-03-26 DIAGNOSIS — D509 Iron deficiency anemia, unspecified: Secondary | ICD-10-CM | POA: Diagnosis not present

## 2016-03-26 DIAGNOSIS — Z8639 Personal history of other endocrine, nutritional and metabolic disease: Secondary | ICD-10-CM | POA: Diagnosis not present

## 2016-03-26 DIAGNOSIS — M542 Cervicalgia: Secondary | ICD-10-CM

## 2016-03-26 DIAGNOSIS — D649 Anemia, unspecified: Secondary | ICD-10-CM | POA: Diagnosis not present

## 2016-03-26 DIAGNOSIS — M25562 Pain in left knee: Secondary | ICD-10-CM

## 2016-03-26 DIAGNOSIS — M545 Low back pain, unspecified: Secondary | ICD-10-CM

## 2016-03-26 DIAGNOSIS — R109 Unspecified abdominal pain: Secondary | ICD-10-CM | POA: Insufficient documentation

## 2016-03-26 DIAGNOSIS — E119 Type 2 diabetes mellitus without complications: Secondary | ICD-10-CM | POA: Diagnosis not present

## 2016-03-26 HISTORY — DX: Low back pain, unspecified: M54.50

## 2016-03-26 HISTORY — DX: Cervicalgia: M54.2

## 2016-03-26 LAB — CBC
HCT: 32.6 % — ABNORMAL LOW (ref 35.0–45.0)
Hemoglobin: 10.3 g/dL — ABNORMAL LOW (ref 11.7–15.5)
MCH: 27.9 pg (ref 27.0–33.0)
MCHC: 31.6 g/dL — ABNORMAL LOW (ref 32.0–36.0)
MCV: 88.3 fL (ref 80.0–100.0)
MPV: 10.2 fL (ref 7.5–12.5)
Platelets: 298 10*3/uL (ref 140–400)
RBC: 3.69 MIL/uL — ABNORMAL LOW (ref 3.80–5.10)
RDW: 15.6 % — ABNORMAL HIGH (ref 11.0–15.0)
WBC: 5.5 10*3/uL (ref 3.8–10.8)

## 2016-03-26 LAB — POCT GLYCOSYLATED HEMOGLOBIN (HGB A1C): Hemoglobin A1C: 5.6

## 2016-03-26 LAB — LIPID PANEL
Cholesterol: 141 mg/dL (ref 125–200)
HDL: 104 mg/dL (ref >=46)
LDL Cholesterol: 19 mg/dL (ref <130)
Total CHOL/HDL Ratio: 1.4 Ratio (ref <=5.0)
Triglycerides: 88 mg/dL (ref <150)
VLDL: 18 mg/dL (ref <30)
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
## 2016-03-26 NOTE — Patient Instructions (Signed)
It was nice to see you today.  Neck/back/knee pain - all soft tissues injuries (no broken bones). Ok to continue Flexeril (muslce relaxant). May take Mobic (Meloxicam) for short period of time until pain resolves (if you notice bleeding please stop immediately and call me). May also take Tramadol for severe pain. I agree with Orthopedics that physical therapy will be beneficial.
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
## 2016-03-30 ENCOUNTER — Telehealth: Payer: Self-pay | Admitting: Family Medicine

## 2016-03-30 ENCOUNTER — Encounter: Payer: Self-pay | Admitting: Family Medicine

## 2016-03-30 ENCOUNTER — Other Ambulatory Visit: Payer: Self-pay | Admitting: Family Medicine

## 2016-03-30 DIAGNOSIS — Z8639 Personal history of other endocrine, nutritional and metabolic disease: Secondary | ICD-10-CM

## 2016-03-30 NOTE — Telephone Encounter (Signed)
Called into pharmacy. Shalamar Plourde, CMA  

## 2016-03-30 NOTE — Assessment & Plan Note (Signed)
A1C 5.6. Continue diet control. Recommended yearly eye exams.

## 2016-03-30 NOTE — Telephone Encounter (Signed)
Discussed A1C/CMP/Lipid results with patient. No changes in therapy.

## 2016-03-30 NOTE — Telephone Encounter (Signed)
RN staff - please call in Ambien 10 mg QHS prn sleep, dispense #30, refill #0, thanks

## 2016-04-01 DIAGNOSIS — M542 Cervicalgia: Secondary | ICD-10-CM | POA: Diagnosis not present

## 2016-04-01 DIAGNOSIS — M47897 Other spondylosis, lumbosacral region: Secondary | ICD-10-CM | POA: Diagnosis not present

## 2016-04-14 ENCOUNTER — Encounter: Payer: Self-pay | Admitting: Family Medicine

## 2016-04-14 DIAGNOSIS — M5136 Other intervertebral disc degeneration, lumbar region: Secondary | ICD-10-CM | POA: Insufficient documentation

## 2016-04-14 DIAGNOSIS — M51369 Other intervertebral disc degeneration, lumbar region without mention of lumbar back pain or lower extremity pain: Secondary | ICD-10-CM | POA: Insufficient documentation

## 2016-04-14 DIAGNOSIS — M170 Bilateral primary osteoarthritis of knee: Secondary | ICD-10-CM

## 2016-04-14 HISTORY — DX: Other intervertebral disc degeneration, lumbar region: M51.36

## 2016-04-14 HISTORY — DX: Other intervertebral disc degeneration, lumbar region without mention of lumbar back pain or lower extremity pain: M51.369

## 2016-04-14 NOTE — Progress Notes (Signed)
Reviewed Records from [place]. Updated EPIC as appropriate.   03/25/16 - xrays of left knee show stable prosthesis without acute abnormality

## 2016-04-15 ENCOUNTER — Other Ambulatory Visit: Payer: Self-pay

## 2016-04-15 DIAGNOSIS — M47897 Other spondylosis, lumbosacral region: Secondary | ICD-10-CM | POA: Diagnosis not present

## 2016-04-15 DIAGNOSIS — M542 Cervicalgia: Secondary | ICD-10-CM | POA: Diagnosis not present

## 2016-04-15 NOTE — Patient Outreach (Signed)
[place] [facility]) Care Management  04/15/2016  [name] 09/07/1956 [id number]   Telephone call to patient for monthly call.  No answer.  HIPAA compliant voice message left.    Plan: RN Health Coach will attempt patient within 2 weeks.  Jone Baseman, RN, MSN [place] [phone]

## 2016-04-20 ENCOUNTER — Other Ambulatory Visit: Payer: Self-pay

## 2016-04-20 DIAGNOSIS — M542 Cervicalgia: Secondary | ICD-10-CM | POA: Diagnosis not present

## 2016-04-20 DIAGNOSIS — M47897 Other spondylosis, lumbosacral region: Secondary | ICD-10-CM | POA: Diagnosis not present

## 2016-04-20 NOTE — Patient Outreach (Signed)
[place]) Care Management  04/20/2016  [name] 1955-12-29 [id number]   Telephone call to patient for monthly call.  No answer. HIPAA compliant voice message left.    Plan: RN Health Coach will attempt patient within 3 weeks.  Jone Baseman, RN, MSN [place] [phone]

## 2016-04-21 ENCOUNTER — Ambulatory Visit: Payer: Commercial Managed Care - HMO | Admitting: Podiatry

## 2016-04-23 ENCOUNTER — Ambulatory Visit: Payer: Self-pay

## 2016-04-26 ENCOUNTER — Telehealth: Payer: Self-pay | Admitting: Family Medicine

## 2016-04-26 DIAGNOSIS — M47897 Other spondylosis, lumbosacral region: Secondary | ICD-10-CM | POA: Diagnosis not present

## 2016-04-26 DIAGNOSIS — M542 Cervicalgia: Secondary | ICD-10-CM | POA: Diagnosis not present

## 2016-04-26 NOTE — Telephone Encounter (Signed)
Patient reports pain/dysuria/vagainal irritation for one day, no vaginal discharge, no fevers/chills. Patient scheduled to see Dr. Ardelia Mems tomorrow morning at 9:30 AM.

## 2016-04-26 NOTE — Telephone Encounter (Signed)
Spoke with patient, she would not go into detail, only wants to speak with PCP.

## 2016-04-26 NOTE — Telephone Encounter (Signed)
Pt is calling to speak to the doctor about her private area.jw

## 2016-04-27 ENCOUNTER — Ambulatory Visit (INDEPENDENT_AMBULATORY_CARE_PROVIDER_SITE_OTHER): Payer: Commercial Managed Care - HMO | Admitting: Family Medicine

## 2016-04-27 ENCOUNTER — Encounter: Payer: Self-pay | Admitting: Family Medicine

## 2016-04-27 ENCOUNTER — Other Ambulatory Visit (HOSPITAL_COMMUNITY)
Admission: RE | Admit: 2016-04-27 | Discharge: 2016-04-27 | Disposition: A | Discharge status: Home / Self Care | Payer: Commercial Managed Care - HMO | Source: Ambulatory Visit | Attending: Family Medicine | Admitting: Family Medicine

## 2016-04-27 VITALS — BP 131/81 | HR 85 | Temp 98.2°F | Ht 64.0 in | Wt 180.0 lb

## 2016-04-27 DIAGNOSIS — N368 Other specified disorders of urethra: Secondary | ICD-10-CM

## 2016-04-27 DIAGNOSIS — R300 Dysuria: Secondary | ICD-10-CM

## 2016-04-27 DIAGNOSIS — Z113 Encounter for screening for infections with a predominantly sexual mode of transmission: Secondary | ICD-10-CM | POA: Insufficient documentation

## 2016-04-27 LAB — POCT URINALYSIS DIPSTICK
Bilirubin, UA: NEGATIVE
Blood, UA: NEGATIVE
Glucose, UA: NEGATIVE
Ketones, UA: NEGATIVE
Leukocytes, UA: NEGATIVE
Nitrite, UA: NEGATIVE
Protein, UA: NEGATIVE
Spec Grav, UA: 1.015
Urobilinogen, UA: 0.2
pH, UA: 5

## 2016-04-27 LAB — HIV ANTIBODY (ROUTINE TESTING W REFLEX): HIV 1&2 Ab, 4th Generation: NONREACTIVE

## 2016-04-27 LAB — POCT WET PREP (WET MOUNT): Clue Cells Wet Prep Whiff POC: NEGATIVE

## 2016-04-27 NOTE — Patient Instructions (Signed)
Does not seem that you have an infection Does seem that your urethra is a little lower than normal  Referring to a urologist - someone will call with that appointment  Return sooner if worsening, fevers, not urinating well, etc  Checking for infections. I will call you with your results  Be well, Dr. Ardelia Mems

## 2016-04-27 NOTE — Progress Notes (Signed)
Date of Visit: 04/27/2016   HPI:  Patient presents for a same day appointment to discuss dysuria.  Has had burning with urination since over the weekend. Also notes feeling irritated and swollen around urethra. No fevers. Eating and drinking well. Normal stooling. Urinating fine other than dysuria. No vaginal discharge. No pelvic pain. Sexually active with 1 female partner for 20 years, though notes some concern for possible STD exposure. Wants testing today.  No back pain.  ROS: See HPI  [place]: history of gastric bypass, prior CVA, hypertension, type 2 diabetes, diverticulosis, venous insufficiency, allergic rhinitis, GERD  PHYSICAL EXAM: BP 131/81 mmHg  Pulse 85  Temp(Src) 98.2 F (36.8 C) (Oral)  Ht 5\' 4"  (1.626 m)  Wt 180 lb (81.647 kg)  BMI 30.88 kg/m2 Gen: NAD, pleasant, cooperative HEENT: normocephalic, atraumatic, mmm Back: no cva tenderness bilaterally  Abdomen: soft nontender to palpation GU: normal appearing external genitalia without lesions, though urethral opening notably lower than normal, concern for prolapse. Vagina is moist with clear discharge. Cervix surgically absent. Mild generalized tenderness on bimanual exam. No adnexal masses.   ASSESSMENT/PLAN:  1. Dysuria - UA not suggestive of infection. No red flags (fevers, back pain) to suggest pyelo. Had thought yeast likely, though exam not consistent with this. Will check for pelvic infections (gc/chl/trich & wet prep). Discussed options with patient including observation vs referral to urology for possible urethral prolapse & she would like referral. Referral entered.  2. STD screen - gc/chl/trich as above, also HIV RPR  FOLLOW UP: Follow up as needed Referring to urology   [name], [place]

## 2016-04-28 ENCOUNTER — Telehealth: Payer: Self-pay | Admitting: Family Medicine

## 2016-04-28 DIAGNOSIS — M47897 Other spondylosis, lumbosacral region: Secondary | ICD-10-CM | POA: Diagnosis not present

## 2016-04-28 DIAGNOSIS — M542 Cervicalgia: Secondary | ICD-10-CM | POA: Diagnosis not present

## 2016-04-28 LAB — CERVICOVAGINAL ANCILLARY ONLY
Chlamydia: NEGATIVE
Neisseria Gonorrhea: NEGATIVE
Trichomonas: NEGATIVE

## 2016-04-28 LAB — RPR

## 2016-04-28 MED ORDER — FLUCONAZOLE 150 MG PO TABS: 150.0000 mg | ORAL_TABLET | Freq: Once | ORAL | Status: DC

## 2016-04-28 NOTE — Telephone Encounter (Signed)
Pt called and would like to know what her lab results from yesterday are. jw

## 2016-04-28 NOTE — Telephone Encounter (Signed)
Returned call to patient Labs all neg except yeast on wet prep Will treat with diflucan 150mg  x1 If not improved with this should see urologist If all better with it, she can hold on urology appointment Patient appreciative  Leeanne Rio, MD

## 2016-04-28 NOTE — Telephone Encounter (Signed)
Will forward this message to Dr. Ardelia Mems as she evaluated the patient.

## 2016-04-29 NOTE — Addendum Note (Signed)
Addended by: Leeanne Rio on: 04/29/2016 10:30 AM   Modules accepted: Orders, SmartSet

## 2016-05-03 DIAGNOSIS — M47897 Other spondylosis, lumbosacral region: Secondary | ICD-10-CM | POA: Diagnosis not present

## 2016-05-03 DIAGNOSIS — M542 Cervicalgia: Secondary | ICD-10-CM | POA: Diagnosis not present

## 2016-05-06 DIAGNOSIS — M47897 Other spondylosis, lumbosacral region: Secondary | ICD-10-CM | POA: Diagnosis not present

## 2016-05-06 DIAGNOSIS — M542 Cervicalgia: Secondary | ICD-10-CM | POA: Diagnosis not present

## 2016-05-11 ENCOUNTER — Other Ambulatory Visit: Payer: Self-pay

## 2016-05-11 DIAGNOSIS — M47897 Other spondylosis, lumbosacral region: Secondary | ICD-10-CM | POA: Diagnosis not present

## 2016-05-11 DIAGNOSIS — M542 Cervicalgia: Secondary | ICD-10-CM | POA: Diagnosis not present

## 2016-05-11 NOTE — Patient Outreach (Signed)
[place] [facility]) Care Management  [place]  05/11/2016   [name] Apr 22, 1956 [id number]  Subjective: Telephone call to patient for monthly call.  Patient reports she is doing ok.  She reports that she is controlling her sugars and they are running around 110.  Discussed with patient maintaining diabetic diet in order to keep sugars under control. She verbalized understanding.   Objective:   Encounter Medications:  Outpatient Encounter Prescriptions as of 05/11/2016  Medication Sig Note  . aspirin 81 MG tablet Take 81 mg by mouth daily.   Marland Kitchen atorvastatin (LIPITOR) 40 MG tablet TAKE ONE TABLET EACH DAY   . calcium carbonate (OS-CAL) 600 MG TABS tablet Take 1 tablet (600 mg total) by mouth 2 (two) times daily with a meal.   . Cholecalciferol (D3 MAXIMUM STRENGTH) 5000 units capsule Take 5,000 Units by mouth daily.   Marland Kitchen docusate sodium (DOK) 100 MG capsule TAKE ONE CAPSULE TWICE A DAY AS NEEDED FOR CONSTIPATION   . esomeprazole (NEXIUM) 40 MG capsule Take 1 tab every morning.   . ferrous sulfate 220 (44 FE) MG/5ML solution Take 5 mLs (220 mg total) by mouth 2 (two) times daily with a meal.   . fluconazole (DIFLUCAN) 150 MG tablet Take 1 tablet (150 mg total) by mouth once.   . fluticasone (FLONASE) 50 MCG/ACT nasal spray Place 2 sprays into both nostrils at bedtime.   . furosemide (LASIX) 20 MG tablet Take 2 tablets (40 mg total) by mouth daily.   . hydrochlorothiazide (MICROZIDE) 12.5 MG capsule TAKE ONE CAPSULE EACH DAY   . lisinopril (PRINIVIL,ZESTRIL) 20 MG tablet Take 1 tablet (20 mg total) by mouth daily.   . Multiple Vitamin (MULTIVITAMIN) tablet Take 1 tablet by mouth daily.   . sucralfate (CARAFATE) 1 GM/10ML suspension Take 10 mLs (1 g total) by mouth 4 (four) times daily -  with meals and at bedtime.   . traMADol (ULTRAM) 50 MG tablet Take 1 tablet (50 mg total) by mouth every 6 (six) hours as needed.   . vitamin B-12 (CYANOCOBALAMIN) 1000 MCG tablet Take  1,000 mcg by mouth daily.   . vitamin C (ASCORBIC ACID) 250 MG tablet Take 250 mg by mouth daily.   . vitamin E 400 UNIT capsule Take 400 Units by mouth daily.   Marland Kitchen zolpidem (AMBIEN) 10 MG tablet TAKE ONE TABLET AT BEDTIME AS NEEDED FOR SLEEP   . cyclobenzaprine (FLEXERIL) 10 MG tablet Take 1 tablet (10 mg total) by mouth 3 (three) times daily as needed for muscle spasms. (Patient not taking: Reported on 05/11/2016) 03/08/2016: Has not filled prescription   . polyethylene glycol powder (GLYCOLAX/MIRALAX) powder TAKE 17G BY MOUTH TWICE DAILY (Patient not taking: Reported on 02/27/2016)    No facility-administered encounter medications on file as of 05/11/2016.    Functional Status:  In your present state of health, do you have any difficulty performing the following activities: 02/23/2016 01/01/2016  Hearing? N N  Vision? N N  Difficulty concentrating or making decisions? N N  Walking or climbing stairs? N Y  Dressing or bathing? N N  Doing errands, shopping? N Y  Conservation officer, nature and eating ? N N  Using the Toilet? N N  In the past six months, have you accidently leaked urine? N N  Do you have problems with loss of bowel control? N Y  Managing your Medications? N N  Managing your Finances? N N  Housekeeping or managing your Housekeeping? N N  Fall/Depression Screening: PHQ 2/9 Scores 05/11/2016 04/27/2016 03/26/2016 02/27/2016 02/23/2016 01/29/2016 01/01/2016  PHQ - 2 Score 0 0 0 0 0 0 0    Assessment: Patient continues to benefit from health coach outreach for disease management and support.   Plan:  [facility] CM Care Plan Problem One        Most Recent Value   Care Plan Problem One  Diabetes education/reiforcement   Role Documenting the Problem One  Health Coach   Care Plan for Problem One  Active   THN Long Term Goal (31-90 days)  Patient will maintain A1c less than 7 within 90 days.   THN Long Term Goal Start Date  05/11/16 [goal continued]   Interventions for Problem One Long Term Goal  RN  Health Coach reiterated with patient A1c and goal of keeping less than 7.   THN CM Short Term Goal #1 (0-30 days)  Patient will maintain diet of low carbhydrates within 30 days.   THN CM Short Term Goal #1 Start Date  05/11/16 [goal continued]   Interventions for Short Term Goal #1  RN Health Coach reiterated with patient importance of maintaining low carbohydrate diet.     THN CM Short Term Goal #2 (0-30 days)  Patient will continue to monitor blood sugars twice a day within 30 days.   THN CM Short Term Goal #2 Start Date  05/11/16 [goal continued]   Interventions for Short Term Goal #2  [place] reiterated importance of monitoring blood sugar even though she not on any medications.        RN Health Coach will contact patient in the month of August and patient agrees to next outreach.  Jone Baseman, RN, MSN [place] [phone]

## 2016-05-13 ENCOUNTER — Other Ambulatory Visit: Payer: Self-pay | Admitting: Family Medicine

## 2016-05-13 ENCOUNTER — Encounter: Payer: Self-pay | Admitting: Family Medicine

## 2016-05-13 ENCOUNTER — Ambulatory Visit (INDEPENDENT_AMBULATORY_CARE_PROVIDER_SITE_OTHER): Payer: Commercial Managed Care - HMO | Admitting: Family Medicine

## 2016-05-13 VITALS — BP 121/74 | HR 83 | Temp 98.4°F | Ht 64.0 in | Wt 182.0 lb

## 2016-05-13 DIAGNOSIS — M542 Cervicalgia: Secondary | ICD-10-CM

## 2016-05-13 DIAGNOSIS — M7989 Other specified soft tissue disorders: Secondary | ICD-10-CM | POA: Diagnosis not present

## 2016-05-13 DIAGNOSIS — M545 Low back pain, unspecified: Secondary | ICD-10-CM

## 2016-05-13 DIAGNOSIS — Z8639 Personal history of other endocrine, nutritional and metabolic disease: Secondary | ICD-10-CM

## 2016-05-13 DIAGNOSIS — I872 Venous insufficiency (chronic) (peripheral): Secondary | ICD-10-CM | POA: Diagnosis not present

## 2016-05-13 DIAGNOSIS — I10 Essential (primary) hypertension: Secondary | ICD-10-CM | POA: Diagnosis not present

## 2016-05-13 DIAGNOSIS — R0789 Other chest pain: Secondary | ICD-10-CM

## 2016-05-13 NOTE — Assessment & Plan Note (Signed)
Improved with prn Mobic/Flexeril. -patient to continue current regimen and PT

## 2016-05-13 NOTE — Assessment & Plan Note (Signed)
Improved.  -continue prn lasix -prescription for compression stockings provided

## 2016-05-13 NOTE — Assessment & Plan Note (Signed)
Improved with prn Mobic/Flexeril -patient to continue PT

## 2016-05-13 NOTE — Patient Instructions (Addendum)
It was nice to see you today.  Leg swelling - continue lasix as needed, start to wear compression stockings during day   Blood Pressure - very well controlled, stop the HCTZ. Continue Lisinopril 20 mg daily. Please call if you continue to have lightheadedness.   Diabetes - well controlled, please call your eye doctor [facility]) and have them send a copy of your recent eye exam.
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
## 2016-05-13 NOTE — Assessment & Plan Note (Signed)
Well controlled based on most recent A1C of 5.6 -continue diet control -patient to have eye exam records sent to office

## 2016-05-13 NOTE — Assessment & Plan Note (Signed)
Short episode (15 minutes) of chest pain and dyspnea 3 nights ago. No current symptoms. Cardiac exam unremarkable. No symptoms with exertion. Symptoms may be consistent with GERD as well. Reviewed previous EKG. -will monitor clinically as currently asymptomatic  -strict return precautions discussed

## 2016-05-13 NOTE — Assessment & Plan Note (Signed)
Well controlled. Intermittent lightheadedness.  -stop HCTZ -continue Lisinopril 20 mg daily

## 2016-05-18 ENCOUNTER — Telehealth: Payer: Self-pay | Admitting: *Deleted

## 2016-05-18 NOTE — Telephone Encounter (Signed)
Spoke with patient, SDA scheduled for tomm at 1:30.

## 2016-05-18 NOTE — Telephone Encounter (Signed)
Pt calling in stating that she wasn't able to be seen by therapy because her legs are swollen because of a bug bite. They told her to come see her pcp. She wants to know what she should do. There are no openings today. Please advise. Jaydon Soroka Kennon Holter, CMA

## 2016-05-18 NOTE — Telephone Encounter (Signed)
Note to RN staff - please call patient back and schedule same day for tomorrow 7/19, however if she is having fevers/chills/increased redness please have her to to urgent care.

## 2016-05-19 ENCOUNTER — Ambulatory Visit (INDEPENDENT_AMBULATORY_CARE_PROVIDER_SITE_OTHER): Payer: Commercial Managed Care - HMO | Admitting: Student

## 2016-05-19 ENCOUNTER — Encounter: Payer: Self-pay | Admitting: Student

## 2016-05-19 ENCOUNTER — Ambulatory Visit: Payer: Commercial Managed Care - HMO | Admitting: Podiatry

## 2016-05-19 VITALS — BP 138/90 | HR 84 | Temp 98.7°F | Wt 186.0 lb

## 2016-05-19 DIAGNOSIS — R21 Rash and other nonspecific skin eruption: Secondary | ICD-10-CM

## 2016-05-19 MED ORDER — CETIRIZINE HCL 10 MG PO TABS: 10.0000 mg | ORAL_TABLET | Freq: Every day | ORAL | Status: DC

## 2016-05-19 NOTE — Patient Instructions (Addendum)
It was great seeing you today! We have addressed the following issues today  1. Skin rash: this could be insect bites. I have sent a prescription for Zyrtec to your pharmacy. However, this is available over-the-counter. Please come back and see [place] if you feel fever, worsening of swelling, pain or other concerning symptoms. 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 If everything is normal, you will get a letter in mail. If you don't hear from [place] in two weeks, please give [place] a call. Otherwise, I look forward to talking with you again at our next visit. If you have any questions or concerns before then, please call the clinic at [phone].  Please bring all your medications to every doctors visit   Sign up for My Chart to have easy access to your labs results, and communication with your Primary care physician.    Please check-out at the front desk before leaving the clinic.   Take Care,

## 2016-05-19 NOTE — Progress Notes (Signed)
   Subjective:    Patient ID: [name], female    DOB: 1956/08/07, 60 y.o.   MRN: [id number]  CC: skin rash  HPI #Skin rash: for two days. Reports ithcing, burning and stinging. Also noted a clear discharge. Denies pus. Denies trauma. Denies fever. No new medication. Symptoms are getting better. 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   Review of Systems  Per history of present illness Objective:   Physical Exam Filed Vitals:   05/19/16 1337  Weight: 186 lb (84.369 kg)    GEN: appears well, NAD LE: appears symmetric, no notable swelling, Homans sign negative.  Skin: Erythematous skin lesion, no calor or dolor. No underlying fluid loculation. No discharge. See picture below for more   NEURO: A&O x3, no gross defecits  PSYCH: appropriate mood and affect     Assessment & Plan:  Right extremity skin lesion: Likely insect bites. Unlikely to be cellulitis or erysipelas. No sign of fluid loculation.  Homans sign negative. No systemic symptoms. It is reassuring that her symptoms are improving. She could have some component of venous insufficiency. -Recommended Zyrtec for itching -Recommended using insect repellents and long pants for outdoor activities -Discussed return precautions including fever, worsening of pain, swelling or pustular discharge

## 2016-05-20 DIAGNOSIS — M47897 Other spondylosis, lumbosacral region: Secondary | ICD-10-CM | POA: Diagnosis not present

## 2016-05-20 DIAGNOSIS — M542 Cervicalgia: Secondary | ICD-10-CM | POA: Diagnosis not present

## 2016-05-22 ENCOUNTER — Other Ambulatory Visit: Payer: Self-pay | Admitting: Family Medicine

## 2016-05-24 ENCOUNTER — Telehealth: Payer: Self-pay | Admitting: *Deleted

## 2016-05-24 NOTE — Telephone Encounter (Signed)
Dr. Cyndia Skeeters saw and evaluated the patient. I will forward this message to him to address.

## 2016-05-24 NOTE — Telephone Encounter (Signed)
Patient states she was seen last Friday for what she believed was a spider bite. She states at appointment the was no drainage but now area seems to have a small hole in the middle and it has become more red and irritated. Offered appointment but patient does not want to come back in, instead wants a referral to wound care. Patient requesting to speak with MD..

## 2016-05-25 ENCOUNTER — Other Ambulatory Visit: Payer: Self-pay | Admitting: Student

## 2016-05-25 DIAGNOSIS — M47897 Other spondylosis, lumbosacral region: Secondary | ICD-10-CM | POA: Diagnosis not present

## 2016-05-25 DIAGNOSIS — M542 Cervicalgia: Secondary | ICD-10-CM | POA: Diagnosis not present

## 2016-05-25 MED ORDER — DOXYCYCLINE HYCLATE 100 MG PO CAPS: 100.0000 mg | ORAL_CAPSULE | Freq: Two times a day (BID) | ORAL | 0 refills | Status: DC

## 2016-05-25 NOTE — Telephone Encounter (Signed)
Sent a prescription for doxycycline to her pharmacy. Please, advice he not to fill it if she is allergic to this medication. I tried to call but she is not picking her phone this morning. Thanks!

## 2016-05-25 NOTE — Telephone Encounter (Signed)
Patient called back and is aware of medication being sent to the pharmacy.  She will plan to pick this up and give [place] a call later this week if she doesn't start seeing some improvement. Grady Mohabir,CMA

## 2016-05-26 ENCOUNTER — Other Ambulatory Visit: Payer: Self-pay | Admitting: *Deleted

## 2016-05-26 MED ORDER — ZOLPIDEM TARTRATE 10 MG PO TABS: ORAL_TABLET | ORAL | 2 refills | Status: DC

## 2016-05-27 ENCOUNTER — Ambulatory Visit (INDEPENDENT_AMBULATORY_CARE_PROVIDER_SITE_OTHER): Payer: Commercial Managed Care - HMO | Admitting: Family Medicine

## 2016-05-27 ENCOUNTER — Encounter: Payer: Self-pay | Admitting: Family Medicine

## 2016-05-27 VITALS — BP 139/85 | HR 81 | Temp 98.1°F | Ht 64.0 in | Wt 184.4 lb

## 2016-05-27 DIAGNOSIS — K219 Gastro-esophageal reflux disease without esophagitis: Secondary | ICD-10-CM | POA: Diagnosis not present

## 2016-05-27 DIAGNOSIS — L989 Disorder of the skin and subcutaneous tissue, unspecified: Secondary | ICD-10-CM

## 2016-05-27 HISTORY — DX: Disorder of the skin and subcutaneous tissue, unspecified: L98.9

## 2016-05-27 MED ORDER — SUCRALFATE 1 GM/10ML PO SUSP: 1.0000 g | Freq: Three times a day (TID) | ORAL | 1 refills | Status: DC

## 2016-05-27 MED ORDER — TRAMADOL HCL 50 MG PO TABS: 50.0000 mg | ORAL_TABLET | Freq: Three times a day (TID) | ORAL | 0 refills | Status: DC | PRN

## 2016-05-27 NOTE — Assessment & Plan Note (Addendum)
Patient is returning for a lesion on her right lateral leg that has been treated with bacitracin and doxycycline day 2 and has not improved.  Per photographs from Dr. Dimple Nanas encounter with the patient, it has clearly worsened.  It is now an open wound and is reportedly draining pus.  Patient remains afebrile. We wrapped the wound with gauze and recommended that she continue with bacitracin. This could be a venous ulcer, given that the patient has a history of PVD (per patient) and has edema and tortuous veins in her feet bilaterally. Could also be infectious and therefore we will continue the course of doxycycline. - patient will keep area covered and treated with bacitracin - cont doxycycline for 7 day course - follow up in one week at our clinic - wound clinic consult

## 2016-05-27 NOTE — Patient Instructions (Signed)
[name], thank you for coming into clinic today.  You were here for a follow up for the sore on the outside of your right leg that has not been getting any better.  You have been taking doxycycline for the past two days and we would like you to continue this course.  Additionally, we have wrapped your leg and we would like you to continue using your bacitracin and to keep it covered, whether with gauze or a bandaid.    I have taken a picture of your sore, so that we can keep track of it's progress over time.  It is possibly skin breakdown from your vascular disease in your legs, or it could also be an infection.    We would like you to follow up with [place] in one week and we will also put in a consult with a wound clinic to make sure that this gets properly treated.  I have sent you home with some medication to help with pain over the next week before your visit.  If you have any concerns feel free to call the office.   Take care, Daniel L. Rosalyn Gess, MD 05/27/2016 4:29 PM

## 2016-05-27 NOTE — Progress Notes (Signed)
    Subjective:  [name] is a 60 y.o. female who presents to the [facility] today to be evaluated for a painful sore on her right leg that is not improving.  HPI: [name] was seen by Dr. Cyndia Skeeters on 05/19/16 as an initial visit for a suspected rash on her right lateral leg.  She reports the skin changes first showing up about one week ago.  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 Describes the lesion as a "bite".  She was initial prescribed zyrtec and recommended to put bacitracin on the lesion until improvement.  She called the office this past week to discuss lack of improvement and was started on doxycycline, which she started on 05/25/16.  She feels that the lesion has worsened.  Now she presents with an open lesion that has been reportedly draining pus.  It is very painful, at times 10/10, but now just 7/10.  It is painful to walk on and the patient is noticably limping as she gets up to the examination table.   She denies any fevers, nausea, vomiting diarrhea, CP or SOB.    The patient is a former smoker and of note she complains of claudication in her legs after walking a short distances.    Objective:  Physical Exam: BP 139/85 (BP Location: Right Arm, Patient Position: Sitting, Cuff Size: Normal)   Pulse 81   Temp 98.1 F (36.7 C) (Oral)   Ht 5\' 4"  (1.626 m)   Wt 184 lb 6.4 oz (83.6 kg)   BMI 31.65 kg/m   Gen: middle aged woman in NAD, resting comfortably CV: RRR with no murmurs appreciated Skin: extremely tender at lesion and the 5cm area around the lesion, mildly erythematous, no swelling or warmth at site, no apparent drainage. (see Dr. Juliann Pares image from 05/19/16 for progression)  Extremity:  Warm and well perfused, palpable dorsal pulses bilaterally, 1+ pitting edema in bilateral legs and feet.    No results found for this or any previous visit (from the past 72 hour(s)).   Assessment/Plan:  Skin lesion of right lower  extremity Patient is returning for a lesion on her right lateral leg that has been treated with bacitracin and doxycycline day 2 and has not improved.  Per photographs from Dr. Dimple Nanas encounter with the patient, it has clearly worsened.  It is now an open wound and is reportedly draining pus.  Patient remains afebrile. We wrapped the wound with gauze and recommended that she continue with bacitracin. This could be a venous ulcer, given that the patient has a history of PVD (per patient) and has edema and tortuous veins in her feet bilaterally. Could also be infectious and therefore we will continue the course of doxycycline. - patient will keep area covered and treated with bacitracin - cont doxycycline for 7 day course - follow up in one week at our clinic - wound clinic consult

## 2016-06-01 ENCOUNTER — Telehealth: Payer: Self-pay | Admitting: Family Medicine

## 2016-06-01 DIAGNOSIS — M47897 Other spondylosis, lumbosacral region: Secondary | ICD-10-CM | POA: Diagnosis not present

## 2016-06-01 DIAGNOSIS — M542 Cervicalgia: Secondary | ICD-10-CM | POA: Diagnosis not present

## 2016-06-01 NOTE — Telephone Encounter (Signed)
Referral corrected and put back into [place]

## 2016-06-01 NOTE — Telephone Encounter (Signed)
Pt wanted to know when her referral would be ready. Pt said her wound is getting worse, it is turning Raj[place]alan and it burns more and more. Please give her a call. Thanks! ep

## 2016-06-01 NOTE — Telephone Encounter (Signed)
LMOVM for pt to call us back. We did send her referral and they are supposed to call her. She needs to call [facility] at [phone] to check status. Deseree Kennon Holter, CMA

## 2016-06-01 NOTE — Telephone Encounter (Signed)
Pt called the wound care center and got an appointment for the 17th and was put on a waiting list. Pt also stated they do not have a referral from [place] and they need it before her appointment. Please let pt know after it has been sent. Thanks! ep

## 2016-06-01 NOTE — Telephone Encounter (Signed)
Pt was advised. ep °

## 2016-06-03 ENCOUNTER — Ambulatory Visit: Payer: Commercial Managed Care - HMO | Admitting: Family Medicine

## 2016-06-03 DIAGNOSIS — M542 Cervicalgia: Secondary | ICD-10-CM | POA: Diagnosis not present

## 2016-06-03 DIAGNOSIS — M47897 Other spondylosis, lumbosacral region: Secondary | ICD-10-CM | POA: Diagnosis not present

## 2016-06-03 NOTE — Progress Notes (Deleted)
   Subjective:   Patient ID: [name], female    DOB: [birth year], 60 y.o.   MRN: [id number]  CC: ***  HPI: [name] is a 60 y.o. female who presents to clinic today ***. Problems discussed today are as follows:  ROS: See HPI.  [place]: Pertinent past medical, surgical, family, and social history were reviewed and updated as appropriate.  Smoking status: reviewed.  Objective:   There were no vitals taken for this visit. Vitals and nursing note reviewed.  GEN: patient is cooperative and pleasant, no acute distress. HEENT: PERRLA, EOM with full ROM, no conjunctival injection, no lid lag, normal tympanic light reflex, no nasal polyps, no rhinorrhea, no pharyngeal erythema or exudates, neck has full ROM without cervical or supraclavicular adenopathy, no thyromegaly or nodules. LUNG: clear to auscultation bilaterally, no wheezes/rhonchi/rales, no use of accessory muscles. CV: RRR, no m/r/g, no carotid bruits bilaterally, no peripheral edema. GI: soft, non-distended, non-tender, normoactive bowel sounds, no hepatosplenomegaly. SKIN: warm and dry, no rashes or lesions. NEURO: II-XII grossly intact, normal gait, peripheral sensation intact. PSYCH: alert & oriented x3, appropriate affect.  Assessment & Plan:   No problem-specific Assessment & Plan notes found for this encounter.  No orders of the defined types were placed in this encounter.  No orders of the defined types were placed in this encounter.   Harriet Butte, DO 06/03/2016 1:47 PM

## 2016-06-07 ENCOUNTER — Telehealth: Payer: Self-pay | Admitting: Family Medicine

## 2016-06-07 NOTE — Telephone Encounter (Signed)
[facility] is calling for a referral to see the patient tomorrow.06/08/16.  She will be seeing Dr. Con Memos NPI [id number] Diag L98.9 Skin Lesion of right lower extremity.  JW

## 2016-06-07 NOTE — Telephone Encounter (Signed)
Referral placed into acuity, currently suspended, [id number]

## 2016-06-08 ENCOUNTER — Ambulatory Visit: Payer: Commercial Managed Care - HMO | Admitting: Podiatry

## 2016-06-08 ENCOUNTER — Encounter: Payer: Commercial Managed Care - HMO | Attending: Surgery | Admitting: Surgery

## 2016-06-08 ENCOUNTER — Other Ambulatory Visit: Payer: Self-pay

## 2016-06-08 DIAGNOSIS — W57XXXA Bitten or stung by nonvenomous insect and other nonvenomous arthropods, initial encounter: Secondary | ICD-10-CM | POA: Insufficient documentation

## 2016-06-08 DIAGNOSIS — K219 Gastro-esophageal reflux disease without esophagitis: Secondary | ICD-10-CM | POA: Diagnosis not present

## 2016-06-08 DIAGNOSIS — L97212 Non-pressure chronic ulcer of right calf with fat layer exposed: Secondary | ICD-10-CM | POA: Insufficient documentation

## 2016-06-08 DIAGNOSIS — I110 Hypertensive heart disease with heart failure: Secondary | ICD-10-CM | POA: Diagnosis not present

## 2016-06-08 DIAGNOSIS — D649 Anemia, unspecified: Secondary | ICD-10-CM | POA: Insufficient documentation

## 2016-06-08 DIAGNOSIS — Z79899 Other long term (current) drug therapy: Secondary | ICD-10-CM | POA: Diagnosis not present

## 2016-06-08 DIAGNOSIS — I509 Heart failure, unspecified: Secondary | ICD-10-CM | POA: Insufficient documentation

## 2016-06-08 DIAGNOSIS — S80861A Insect bite (nonvenomous), right lower leg, initial encounter: Secondary | ICD-10-CM | POA: Diagnosis not present

## 2016-06-08 DIAGNOSIS — Z87891 Personal history of nicotine dependence: Secondary | ICD-10-CM | POA: Insufficient documentation

## 2016-06-08 NOTE — Patient Outreach (Signed)
[facility]) Care Management  [place]  06/08/2016   [name] [birth year] [id number]  Subjective:  Telephone call to patient for monthly call. Patient reports she is doing ok.  Patient reports she went to the wound clinic on today for a right leg wound that appeared about 3 weeks ago.  Patient reports that the wound is pink and some [place]. Patient states she has some circulation issues possibly explaining delay in healing. Patient to go to the wound clinic weekly for now and that she will be dressing the wound as well. Discussed with patient wound healing, signs of infection, and how keeping her blood sugar lower will assist with wound healing. She verbalized understanding.   Objective:   Encounter Medications:  Outpatient Encounter Prescriptions as of 06/08/2016  Medication Sig Note  . aspirin 81 MG tablet Take 81 mg by mouth daily.   Marland Kitchen atorvastatin (LIPITOR) 40 MG tablet TAKE ONE TABLET EACH DAY   . calcium carbonate (OS-CAL) 600 MG TABS tablet Take 1 tablet (600 mg total) by mouth 2 (two) times daily with a meal.   . cetirizine (ZYRTEC) 10 MG tablet Take 1 tablet (10 mg total) by mouth daily.   . Cholecalciferol (D3 MAXIMUM STRENGTH) 5000 units capsule Take 5,000 Units by mouth daily.   Marland Kitchen doxycycline (VIBRAMYCIN) 100 MG capsule Take 1 capsule (100 mg total) by mouth 2 (two) times daily.   Marland Kitchen esomeprazole (NEXIUM) 40 MG capsule Take 1 tab every morning.   . ferrous sulfate 220 (44 FE) MG/5ML solution Take 5 mLs (220 mg total) by mouth 2 (two) times daily with a meal.   . fluticasone (FLONASE) 50 MCG/ACT nasal spray Place 2 sprays into both nostrils at bedtime.   . furosemide (LASIX) 20 MG tablet TAKE TWO TABLETS EVERY DAY   . lisinopril (PRINIVIL,ZESTRIL) 20 MG tablet Take 1 tablet (20 mg total) by mouth daily.   . meloxicam (MOBIC) 15 MG tablet Take 15 mg by mouth daily. 05/13/2016: Received from: External Pharmacy Received Sig:   . Multiple Vitamin  (MULTIVITAMIN) tablet Take 1 tablet by mouth daily.   . polyethylene glycol powder (GLYCOLAX/MIRALAX) powder TAKE 17G BY MOUTH TWICE DAILY   . sucralfate (CARAFATE) 1 GM/10ML suspension Take 10 mLs (1 g total) by mouth 4 (four) times daily -  with meals and at bedtime.   . traMADol (ULTRAM) 50 MG tablet Take 1 tablet (50 mg total) by mouth every 8 (eight) hours as needed for moderate pain or severe pain.   . vitamin B-12 (CYANOCOBALAMIN) 1000 MCG tablet Take 1,000 mcg by mouth daily.   . vitamin C (ASCORBIC ACID) 250 MG tablet Take 250 mg by mouth daily.   . vitamin E 400 UNIT capsule Take 400 Units by mouth daily.   . Vitamins A & D 5000-400 units CAPS Take 5,000 Units by mouth daily. 05/13/2016: Received from: External Pharmacy Received Sig:   . zolpidem (AMBIEN) 10 MG tablet TAKE ONE TABLET AT BEDTIME AS NEEDED FOR SLEEP   . cyclobenzaprine (FLEXERIL) 10 MG tablet Take 1 tablet (10 mg total) by mouth 3 (three) times daily as needed for muscle spasms. (Patient not taking: Reported on 05/11/2016) 03/08/2016: Has not filled prescription   . docusate sodium (DOK) 100 MG capsule TAKE ONE CAPSULE TWICE A DAY AS NEEDED FOR CONSTIPATION    No facility-administered encounter medications on file as of 06/08/2016.     Functional Status:  In your present state of health, do you  have any difficulty performing the following activities: 02/23/2016 01/01/2016  Hearing? N N  Vision? N N  Difficulty concentrating or making decisions? N N  Walking or climbing stairs? N Y  Dressing or bathing? N N  Doing errands, shopping? N Y  Conservation officer, nature and eating ? N N  Using the Toilet? N N  In the past six months, have you accidently leaked urine? N N  Do you have problems with loss of bowel control? N Y  Managing your Medications? N N  Managing your Finances? N N  Housekeeping or managing your Housekeeping? N N  Some recent data might be hidden    Fall/Depression Screening: PHQ 2/9 Scores 06/08/2016 05/27/2016  05/19/2016 05/11/2016 04/27/2016 03/26/2016 02/27/2016  PHQ - 2 Score 0 0 0 0 0 0 0    Assessment: Patient continues to benefit from health coach outreach for disease management and support.    Plan:  [facility] CM Care Plan Problem One   Flowsheet Row Most Recent Value  Care Plan Problem One  Diabetes education/reiforcement  Role Documenting the Problem One  Health [place] for Problem One  Active  THN Long Term Goal (31-90 days)  Patient will maintain A1c less than 7 within 90 days.  THN Long Term Goal Start Date  05/11/16 [goal continued]  Interventions for Problem One Long Term Goal  RN Health Coach reviewed with patient A1c and goal of keeping less than 7.  THN CM Short Term Goal #1 (0-30 days)  Patient will maintain diet of low carbhydrates within 30 days.  THN CM Short Term Goal #1 Start Date  06/08/16 [goal continued]  Interventions for Short Term Goal #1  RN Health Coach reviewed with patient importance of maintaining low carbohydrate diet.    THN CM Short Term Goal #2 (0-30 days)  Patient will continue to monitor blood sugars twice a day within 30 days.  THN CM Short Term Goal #2 Start Date  06/08/16 [goal continued]  Interventions for Short Term Goal #2  [place] reviewed importance of monitoring blood sugar even though she not on any medications.    THN CM Short Term Goal #3 (0-30 days)  Patient will report  some improvement of wound to right leg within 30 days.    THN CM Short Term Goal #3 Start Date  06/08/16  Interventions for Short Tern Goal #3  Discussed with patient things to promote wound healing, signs of infection, and importance of keeping blood sugar under control.       RN Health Coach will contact patient in the month of September and patient agrees to next outreach.  Jone Baseman, RN, MSN [place] [phone]

## 2016-06-08 NOTE — Progress Notes (Signed)
[name], [name] ([id number]) Visit Report for 06/08/2016 Abuse/Suicide Risk Screen Details [name], [name]. Date of Service: 06/08/2016 8:45 AM Medical Record Number: [id number] Patient Account Number: [id number] Date of Birth/Sex: Jun 08, 1956 (60 y.o. Female) Treating RN: Afful, RN, BSN, Velva Harman Primary Care Physician: Dossie Arbour Other Clinician: Referring Physician: Rosana Berger Treating Physician/Extender: Frann Rider in Treatment: 0 Abuse/Suicide Risk Screen Items Answer ABUSE/SUICIDE RISK SCREEN: Has anyone close to you tried to hurt or harm you recentlyo No Do you feel uncomfortable with anyone in your familyo No Has anyone forced you do things that you didnot want to doo No Do you have any thoughts of harming yourselfo No Patient displays signs or symptoms of abuse and/or neglect. No Electronic Signature(s) Signed: 06/08/2016 4:40:39 PM By: Regan Lemming BSN, RN Entered By: Regan Lemming on 06/08/2016 09:00:22 Borunda, Toy Baker ([id number]) -------------------------------------------------------------------------------- Activities of Daily Living Details [name]. Date of Service: 06/08/2016 8:45 AM Medical Record Number: [id number] Patient Account Number: [id number] Date of Birth/Sex: 02-03-1956 (60 y.o. Female) Treating RN: Baruch Gouty, RN, BSN, Velva Harman Primary Care Physician: Dossie Arbour Other Clinician: Referring Physician: Rosana Berger Treating Physician/Extender: Frann Rider in Treatment: 0 Activities of Daily Living Items Answer Activities of Daily Living (Please select one for each item) Drive Automobile Completely Able Take Medications Completely Able Use Telephone Completely Able Care for Appearance Completely Able Use Toilet Completely Able Bath / Shower Completely Able Dress Self Completely Able Feed Self Completely Able Walk Completely Able Get In / Out Bed Completely Able Housework Completely Able Prepare Meals  Completely Able Handle Money Completely Able Shop for Self Completely Able Electronic Signature(s) Signed: 06/08/2016 4:40:39 PM By: Regan Lemming BSN, RN Entered By: Regan Lemming on 06/08/2016 09:00:08 Egley, Toy Baker ([id number]) -------------------------------------------------------------------------------- Education Assessment Details [name]. Date of Service: 06/08/2016 8:45 AM Medical Record Number: [id number] Patient Account Number: [id number] Date of Birth/Sex: April 28, 1956 (60 y.o. Female) Treating RN: Afful, RN, BSN, Velva Harman Primary Care Physician: Dossie Arbour Other Clinician: Referring Physician: Rosana Berger Treating Physician/Extender: Frann Rider in Treatment: 0 Primary Learner Assessed: Patient Learning Preferences/Education Level/Primary Language Learning Preference: Explanation Highest Education Level: High School Preferred Language: English Cognitive Barrier Assessment/Beliefs Language Barrier: No Physical Barrier Assessment Impaired Vision: No Impaired Hearing: No Decreased Hand dexterity: No Knowledge/Comprehension Assessment Knowledge Level: High Comprehension Level: High Ability to understand written High instructions: Ability to understand verbal High instructions: Motivation Assessment Anxiety Level: Calm Cooperation: Cooperative Education Importance: Acknowledges Need Interest in Health Problems: Asks Questions Perception: Coherent Willingness to Engage in Self- High Management Activities: Readiness to Engage in Self- High Management Activities: Electronic Signature(s) Signed: 06/08/2016 4:40:39 PM By: Regan Lemming BSN, RN Entered By: Regan Lemming on 06/08/2016 08:59:32 Dominique, Toy Baker ([id number]) -------------------------------------------------------------------------------- Fall Risk Assessment Details [name]. Date of Service: 06/08/2016 8:45 AM Medical Record Number: [id number] Patient Account  Number: [id number] Date of Birth/Sex: [birth year] (60 y.o. Female) Treating RN: Afful, RN, BSN, [place] Primary Care Physician: Dossie Arbour Other Clinician: Referring Physician: Rosana Berger Treating Physician/Extender: Frann Rider in Treatment: 0 Fall Risk Assessment Items Have you had 2 or more falls in the last 12 monthso 0 No Have you had any fall that resulted in injury in the last 12 monthso 0 No FALL RISK ASSESSMENT: History of falling - immediate or within 3 months 0 No Secondary diagnosis 0 No Ambulatory aid None/bed rest/wheelchair/nurse 0 Yes Crutches/cane/walker 0 No Furniture 0 No IV Access/Saline Lock 0 No  Gait/Training Normal/bed rest/immobile 0 Yes Weak 0 No Impaired 0 No Mental Status Oriented to own ability 0 Yes Electronic Signature(s) Signed: 06/08/2016 4:40:39 PM By: Regan Lemming BSN, RN Entered By: Regan Lemming on 06/08/2016 08:58:43 Garbutt, Toy Baker ([id number]) -------------------------------------------------------------------------------- Foot Assessment Details [name]. Date of Service: 06/08/2016 8:45 AM Medical Record Number: [id number] Patient Account Number: [id number] Date of Birth/Sex: [birth year] (60 y.o. Female) Treating RN: Afful, RN, BSN, Velva Harman Primary Care Physician: Dossie Arbour Other Clinician: Referring Physician: Rosana Berger Treating Physician/Extender: Frann Rider in Treatment: 0 Foot Assessment Items Site Locations + = Sensation present, - = Sensation absent, C = Callus, U = Ulcer R = Redness, W = Warmth, M = Maceration, PU = Pre-ulcerative lesion F = Fissure, S = Swelling, D = Dryness Assessment Right: Left: Other Deformity: No No Prior Foot Ulcer: No No Prior Amputation: No No Charcot Joint: No No Ambulatory Status: Ambulatory Without Help Gait: Steady Electronic Signature(s) Signed: 06/08/2016 4:40:39 PM By: Regan Lemming BSN, RN Entered By: Regan Lemming on 06/08/2016 08:57:59 Lotts, Toy Baker  ([id number]) -------------------------------------------------------------------------------- Nutrition Risk Assessment Details [name]. Date of Service: 06/08/2016 8:45 AM Medical Record Number: [id number] Patient Account Number: [id number] Date of Birth/Sex: 09-03-1956 (60 y.o. Female) Treating RN: Afful, RN, BSN, [place] Primary Care Physician: Dossie Arbour Other Clinician: Referring Physician: Rosana Berger Treating Physician/Extender: Frann Rider in Treatment: 0 Height (in): 64 Weight (lbs): 182 Body Mass Index (BMI): 31.2 Nutrition Risk Assessment Items NUTRITION RISK SCREEN: I have an illness or condition that made me change the kind and/or 0 No amount of food I eat I eat fewer than two meals per day 0 No I eat few fruits and vegetables, or milk products 0 No I have three or more drinks of beer, liquor or wine almost every day 0 No I have tooth or mouth problems that make it hard for me to eat 0 No I don't always have enough money to buy the food I need 0 No I eat alone most of the time 0 No I take three or more different prescribed or over-the-counter drugs a 0 No day Without wanting to, I have lost or gained 10 pounds in the last six 2 Yes months I am not always physically able to shop, cook and/or feed myself 0 No Nutrition Protocols Good Risk Protocol 0 No interventions needed Moderate Risk Protocol Electronic Signature(s) Signed: 06/08/2016 4:40:39 PM By: Regan Lemming BSN, RN Entered By: Regan Lemming on 06/08/2016 [id number]
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
## 2016-06-15 ENCOUNTER — Encounter: Payer: Commercial Managed Care - HMO | Admitting: Surgery

## 2016-06-15 DIAGNOSIS — S80861A Insect bite (nonvenomous), right lower leg, initial encounter: Secondary | ICD-10-CM | POA: Diagnosis not present

## 2016-06-15 DIAGNOSIS — Z87891 Personal history of nicotine dependence: Secondary | ICD-10-CM | POA: Diagnosis not present

## 2016-06-15 DIAGNOSIS — I509 Heart failure, unspecified: Secondary | ICD-10-CM | POA: Diagnosis not present

## 2016-06-15 DIAGNOSIS — D649 Anemia, unspecified: Secondary | ICD-10-CM | POA: Diagnosis not present

## 2016-06-15 DIAGNOSIS — I110 Hypertensive heart disease with heart failure: Secondary | ICD-10-CM | POA: Diagnosis not present

## 2016-06-15 DIAGNOSIS — K219 Gastro-esophageal reflux disease without esophagitis: Secondary | ICD-10-CM | POA: Diagnosis not present

## 2016-06-15 DIAGNOSIS — L97212 Non-pressure chronic ulcer of right calf with fat layer exposed: Secondary | ICD-10-CM | POA: Diagnosis not present

## 2016-06-15 DIAGNOSIS — Z79899 Other long term (current) drug therapy: Secondary | ICD-10-CM | POA: Diagnosis not present

## 2016-06-15 NOTE — Progress Notes (Signed)
[name], [name] ([id number]) Visit Report for 06/15/2016 Chief Complaint Document Details [name], [name]. Date of Service: 06/15/2016 11:30 AM Medical Record Number: [id number] Patient Account Number: [id number] Date of Birth/Sex: [birth year] (60 y.o. Female) Treating RN: Afful, RN, BSN, Velva Harman Primary Care Physician: Dossie Arbour Other Clinician: Referring Physician: Dossie Arbour Treating Physician/Extender: Frann Rider in Treatment: 1 Information Obtained from: Patient Chief Complaint Patient presents to the wound care center for a consult due non healing wound the right lower extremity which she's had for about a month Electronic Signature(s) Signed: 06/15/2016 12:06:17 PM By: Christin Fudge MD, FACS Entered By: Christin Fudge on 06/15/2016 12:06:16 Old, [name] ([id number]) -------------------------------------------------------------------------------- Debridement Details [name]. Date of Service: 06/15/2016 11:30 AM Medical Record Number: [id number] Patient Account Number: [id number] Date of Birth/Sex: [birth year] (60 y.o. Female) Treating RN: Afful, RN, BSN, Velva Harman Primary Care Physician: Dossie Arbour Other Clinician: Referring Physician: Dossie Arbour Treating Physician/Extender: Frann Rider in Treatment: 1 Debridement Performed for Wound #1 Right,Proximal,Lateral Lower Leg Assessment: Performed By: Physician Christin Fudge, MD Debridement: Debridement Pre-procedure Yes Verification/Time Out Taken: Start Time: 11:43 Pain Control: Lidocaine 4% Topical Solution Level: Skin/Subcutaneous Tissue Total Area Debrided (L x 1.7 (cm) x 1.5 (cm) = 2.55 (cm) W): Tissue and other Non-Viable, Fibrin/Slough, Subcutaneous material debrided: Instrument: Curette Bleeding: Minimum Hemostasis Achieved: Pressure End Time: 11:47 Procedural Pain: 0 Post Procedural Pain: 0 Response to Treatment: Procedure was tolerated well Post Debridement  Measurements of Total Wound Length: (cm) 1.7 Width: (cm) 1.5 Depth: (cm) 0.1 Volume: (cm) 0.2 Post Procedure Diagnosis Same as Pre-procedure Electronic Signature(s) Signed: 06/15/2016 12:06:10 PM By: Christin Fudge MD, FACS Signed: 06/15/2016 12:30:15 PM By: Regan Lemming BSN, RN Previous Signature: 06/15/2016 12:05:58 PM Version By: Christin Fudge MD, FACS Entered By: Christin Fudge on 06/15/2016 12:06:10 [name], [name] ([id number]) -------------------------------------------------------------------------------- HPI Details [name]. Date of Service: 06/15/2016 11:30 AM Medical Record Number: [id number] Patient Account Number: [id number] Date of Birth/Sex: 07-16-1956 (60 y.o. Female) Treating RN: Baruch Gouty, RN, BSN, Velva Harman Primary Care Physician: Dossie Arbour Other Clinician: Referring Physician: Dossie Arbour Treating Physician/Extender: Frann Rider in Treatment: 1 History of Present Illness Location: right lower extremity Quality: Patient reports experiencing a sharp pain to affected area(s). Severity: Patient states wound are getting worse. Duration: Patient has had the wound for > 1 month prior to seeking treatment at the wound center Timing: Pain in wound is Intermittent (comes and goes Context: The wound occurred when the patient had a painful swelling which then started discharging pus and may have been an insect bite Modifying Factors: Other treatment(s) tried include:local antibiotic ointment and oral antibiotics Associated Signs and Symptoms: Patient reports having increase discharge. HPI Description: 60 year-old female who presented to her family medical doctor with a painful ulcer on the right leg which she has had for about 3 weeks. she was not sure whether she had a insect bite and was initially prescribed antihistamines. Down the line she was also started on doxycycline. She continues to drain pus at the present time. Her past medical history is  significant for hypertension, venous insufficiency, superficial thrombophlebitis, diverticulosis, osteoarthritis of both knees, status post gastric bypass, iron deficiency anemia,at his post laparoscopic subtotal colectomy for pancolonic diverticulosis, lumbar back pain. She had a venous duplex study done on 02/05/2016 which showed no DVT, SVT or incompetence bilaterally. Electronic Signature(s) Signed: 06/15/2016 12:06:21 PM By: Christin Fudge MD, FACS Entered By: Christin Fudge on 06/15/2016 12:06:21 [name], [name] ([id number]) -------------------------------------------------------------------------------- Physical Exam Details [name], SHINGLETON. Date of Service: 06/15/2016 11:30 AM Medical Record Number: [id number] Patient Account Number: [id number] Date of Birth/Sex: 01/18/1956 (60 y.o. Female) Treating RN: Baruch Gouty, RN, BSN, Velva Harman Primary Care Physician: Dossie Arbour Other Clinician: Referring Physician: Dossie Arbour Treating Physician/Extender: Frann Rider in Treatment: 1 Constitutional . Pulse regular. Respirations normal and unlabored. Afebrile. . Eyes Nonicteric. Reactive to light. Ears, Nose, Mouth, and Throat Lips, teeth, and gums WNL.Marland Kitchen Moist mucosa without lesions. Neck supple and nontender. No palpable supraclavicular or cervical adenopathy. Normal sized without goiter. Respiratory WNL. No retractions.. Breath sounds WNL, No rubs, rales, rhonchi, or wheeze.. Cardiovascular Heart rhythm and rate regular, no murmur or gallop.. Pedal Pulses WNL. No clubbing, cyanosis or edema. Chest Breasts symmetical and no nipple discharge.. Breast tissue WNL, no masses, lumps, or tenderness.. Lymphatic No adneopathy. No adenopathy. No adenopathy. Musculoskeletal Adexa without tenderness or enlargement.. Digits and nails w/o clubbing, cyanosis, infection, petechiae, ischemia, or inflammatory conditions.. Integumentary (Hair, Skin) No suspicious lesions. No crepitus or  fluctuance. No peri-wound warmth or erythema. No masses.Marland Kitchen Psychiatric Judgement and insight Intact.. No evidence of depression, anxiety, or agitation.. Notes the wound has significant amount of subcutaneous slough and I sharply debrided this with #3 curet and bleeding controlled with pressure. Electronic Signature(s) Signed: 06/15/2016 12:06:42 PM By: Christin Fudge MD, FACS Entered By: Christin Fudge on 06/15/2016 12:06:41 [name], [name] ([id number]) -------------------------------------------------------------------------------- Physician Orders Details [name]. Date of Service: 06/15/2016 11:30 AM Medical Record Number: [id number] Patient Account Number: [id number] Date of Birth/Sex: 04-Apr-1956 (60 y.o. Female) Treating RN: Baruch Gouty, RN, BSN, Velva Harman Primary Care Physician: Dossie Arbour Other Clinician: Referring Physician: Dossie Arbour Treating Physician/Extender: Frann Rider in Treatment: 1 Verbal / Phone Orders: Yes Clinician: Afful, RN, BSN, Rita Read Back and Verified: Yes Diagnosis Coding Wound Cleansing Wound #1 Right,Proximal,Lateral Lower Leg o Clean wound with Normal Saline. Wound #2 Right,Distal,Lateral Lower Leg o Clean wound with Normal Saline. Anesthetic Wound #1 Right,Proximal,Lateral Lower Leg o Topical Lidocaine 4% cream applied to wound bed prior to debridement Wound #2 Right,Distal,Lateral Lower Leg o Topical Lidocaine 4% cream applied to wound bed prior to debridement Skin Barriers/Peri-Wound Care Wound #1 Right,Proximal,Lateral Lower Leg o Skin Prep Wound #2 Right,Distal,Lateral Lower Leg o Skin Prep Primary Wound Dressing Wound #1 Right,Proximal,Lateral Lower Leg o Santyl Ointment Wound #2 Right,Distal,Lateral Lower Leg o Santyl Ointment Secondary Dressing Wound #1 Right,Proximal,Lateral Lower Leg o Dry Gauze o Non-adherent pad Wound #2 Right,Distal,Lateral Lower Leg o Dry Gauze o Non-adherent  pad [name], [name] ([id number]) Dressing Change Frequency Wound #1 Right,Proximal,Lateral Lower Leg o Change dressing every day. Wound #2 Right,Distal,Lateral Lower Leg o Change dressing every day. Follow-up Appointments Wound #1 Right,Proximal,Lateral Lower Leg o Return Appointment in 1 week. Wound #2 Right,Distal,Lateral Lower Leg o Return Appointment in 1 week. Edema Control Wound #1 Right,Proximal,Lateral Lower Leg o Elevate legs to the level of the heart and pump ankles as often as possible Wound #2 Right,Distal,Lateral Lower Leg o Elevate legs to the level of the heart and pump ankles as often as possible Additional Orders / Instructions Wound #1 Right,Proximal,Lateral Lower Leg o Increase protein intake. o Activity as tolerated Wound #2 Right,Distal,Lateral Lower Leg o Increase protein intake. o Activity as tolerated Electronic Signature(s) Signed: 06/15/2016 12:12:38 PM By: Christin Fudge MD, FACS Signed: 06/15/2016 12:30:15 PM By: Regan Lemming BSN, RN Entered By: Regan Lemming on 06/15/2016 11:46:15 [name], [name] ([id number]) -------------------------------------------------------------------------------- Problem List Details Patient  Name: DIELLA, HERMANNS. Date of Service: 06/15/2016 11:30 AM Medical Record Number: [id number] Patient Account Number: [id number] Date of Birth/Sex: 1956/05/02 (60 y.o. Female) Treating RN: Baruch Gouty, RN, BSN, Velva Harman Primary Care Physician: Dossie Arbour Other Clinician: Referring Physician: Dossie Arbour Treating Physician/Extender: Frann Rider in Treatment: 1 Active Problems ICD-10 Encounter Code Description Active Date Diagnosis [id number] Insect bite (nonvenomous), right lower leg, initial 06/08/2016 Yes encounter 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 layer 06/08/2016 Yes exposed Inactive Problems Resolved Problems Electronic Signature(s) Signed: 06/15/2016 12:05:46 PM By: Christin Fudge MD,  FACS Entered By: Christin Fudge on 06/15/2016 12:05:45 [name], [name] ([id number]) -------------------------------------------------------------------------------- Progress Note Details [name]. Date of Service: 06/15/2016 11:30 AM Medical Record Number: [id number] Patient Account Number: [id number] Date of Birth/Sex: 1956/06/28 (60 y.o. Female) Treating RN: Afful, RN, BSN, Velva Harman Primary Care Physician: Dossie Arbour Other Clinician: Referring Physician: Dossie Arbour Treating Physician/Extender: Frann Rider in Treatment: 1 Subjective Chief Complaint Information obtained from Patient Patient presents to the wound care center for a consult due non healing wound the right lower extremity which she's had for about a month History of Present Illness (HPI) The following HPI elements were documented for the patient's wound: Location: right lower extremity Quality: Patient reports experiencing a sharp pain to affected area(s). Severity: Patient states wound are getting worse. Duration: Patient has had the wound for > 1 month prior to seeking treatment at the wound center Timing: Pain in wound is Intermittent (comes and goes Context: The wound occurred when the patient had a painful swelling which then started discharging pus and may have been an insect bite Modifying Factors: Other treatment(s) tried include:local antibiotic ointment and oral antibiotics Associated Signs and Symptoms: Patient reports having increase discharge. 60 year-old female who presented to her family medical doctor with a painful ulcer on the right leg which she has had for about 3 weeks. she was not sure whether she had a insect bite and was initially prescribed antihistamines. Down the line she was also started on doxycycline. She continues to drain pus at the present time. Her past medical history is significant for hypertension, venous insufficiency, superficial  thrombophlebitis, diverticulosis, osteoarthritis of both knees, status post gastric bypass, iron deficiency anemia,at his post laparoscopic subtotal colectomy for pancolonic diverticulosis, lumbar back pain. She had a venous duplex study done on 02/05/2016 which showed no DVT, SVT or incompetence bilaterally. Objective Constitutional Pulse regular. Respirations normal and unlabored. Afebrile. Vitals Time Taken: 11:16 AM, Height: 64 in, Weight: 182 lbs, BMI: 31.2, Temperature: 98.2 F, Pulse: 79 [name], Hannah M. ([id number]) bpm, Respiratory Rate: 18 breaths/min, Blood Pressure: 138/77 mmHg. Eyes Nonicteric. Reactive to light. Ears, Nose, Mouth, and Throat Lips, teeth, and gums WNL.Marland Kitchen Moist mucosa without lesions. Neck supple and nontender. No palpable supraclavicular or cervical adenopathy. Normal sized without goiter. Respiratory WNL. No retractions.. Breath sounds WNL, No rubs, rales, rhonchi, or wheeze.. Cardiovascular Heart rhythm and rate regular, no murmur or gallop.. Pedal Pulses WNL. No clubbing, cyanosis or edema. Chest Breasts symmetical and no nipple discharge.. Breast tissue WNL, no masses, lumps, or tenderness.. Lymphatic No adneopathy. No adenopathy. No adenopathy. Musculoskeletal Adexa without tenderness or enlargement.. Digits and nails w/o clubbing, cyanosis, infection, petechiae, ischemia, or inflammatory conditions.Marland Kitchen Psychiatric Judgement and insight Intact.. No evidence of depression, anxiety, or agitation.. General Notes: the wound has significant amount of subcutaneous slough and I sharply debrided this with #3 curet and bleeding controlled with pressure. Integumentary (Hair, Skin) No suspicious lesions.  No crepitus or fluctuance. No peri-wound warmth or erythema. No masses.. Wound #1 status is Open. Original cause of wound was Gradually Appeared. The wound is located on the Right,Proximal,Lateral Lower Leg. The wound measures 1.7cm length x 1.5cm width x 0.1cm  depth; 2.003cm^2 area and 0.2cm^3 volume. The wound is limited to skin breakdown. There is no tunneling or undermining noted. There is a medium amount of serosanguineous drainage noted. The wound margin is distinct with the outline attached to the wound base. There is medium (34-66%) pink, pale granulation within the wound bed. There is a small (1-33%) amount of necrotic tissue within the wound bed including Adherent Slough. The periwound skin appearance exhibited: Localized Edema, Moist, Hemosiderin Staining. The periwound skin appearance did not exhibit: Callus, Crepitus, Excoriation, Fluctuance, Friable, Induration, Rash, Scarring, Dry/Scaly, Maceration, Atrophie Blanche, Cyanosis, Ecchymosis, Mottled, Pallor, Rubor, Erythema. Periwound temperature was noted as No Abnormality. The periwound has tenderness on palpation. Wound #2 status is Open. Original cause of wound was Gradually Appeared. The wound is located on the Perry, [name]. ([id number]) Right,Distal,Lateral Lower Leg. The wound measures 1cm length x 1cm width x 0.1cm depth; 0.785cm^2 area and 0.079cm^3 volume. The wound is limited to skin breakdown. There is no tunneling or undermining noted. There is a medium amount of serosanguineous drainage noted. The wound margin is distinct with the outline attached to the wound base. There is medium (34-66%) pink, pale granulation within the wound bed. There is a small (1-33%) amount of necrotic tissue within the wound bed including Adherent Slough. The periwound skin appearance exhibited: Localized Edema, Moist, Hemosiderin Staining. The periwound skin appearance did not exhibit: Callus, Crepitus, Excoriation, Fluctuance, Friable, Induration, Rash, Scarring, Dry/Scaly, Maceration, Atrophie Blanche, Cyanosis, Ecchymosis, Mottled, Pallor, Rubor, Erythema. Periwound temperature was noted as No Abnormality. The periwound has tenderness on palpation. Assessment Active Problems ICD-10 S80.861A  - Insect bite (nonvenomous), right lower leg, initial encounter L97.212 - Non-pressure chronic ulcer of right calf with fat layer exposed Procedures Wound #1 Wound #1 is a To be determined located on the Right,Proximal,Lateral Lower Leg . There was a Skin/Subcutaneous Tissue Debridement [id number]) debridement with total area of 2.55 sq cm performed by Christin Fudge, MD. with the following instrument(s): Curette to remove Non-Viable tissue/material including Fibrin/Slough and Subcutaneous after achieving pain control using Lidocaine 4% Topical Solution. A time out was conducted prior to the start of the procedure. A Minimum amount of bleeding was controlled with Pressure. The procedure was tolerated well with a pain level of 0 throughout and a pain level of 0 following the procedure. Post Debridement Measurements: 1.7cm length x 1.5cm width x 0.1cm depth; 0.2cm^3 volume. Post procedure Diagnosis Wound #1: Same as Pre-Procedure Plan Wound Cleansing: Wound #1 Right,Proximal,Lateral Lower Leg: Gage, [name] ([id number]) Clean wound with Normal Saline. Wound #2 Right,Distal,Lateral Lower Leg: Clean wound with Normal Saline. Anesthetic: Wound #1 Right,Proximal,Lateral Lower Leg: Topical Lidocaine 4% cream applied to wound bed prior to debridement Wound #2 Right,Distal,Lateral Lower Leg: Topical Lidocaine 4% cream applied to wound bed prior to debridement Skin Barriers/Peri-Wound Care: Wound #1 Right,Proximal,Lateral Lower Leg: Skin Prep Wound #2 Right,Distal,Lateral Lower Leg: Skin Prep Primary Wound Dressing: Wound #1 Right,Proximal,Lateral Lower Leg: Santyl Ointment Wound #2 Right,Distal,Lateral Lower Leg: Santyl Ointment Secondary Dressing: Wound #1 Right,Proximal,Lateral Lower Leg: Dry Gauze Non-adherent pad Wound #2 Right,Distal,Lateral Lower Leg: Dry Gauze Non-adherent pad Dressing Change Frequency: Wound #1 Right,Proximal,Lateral Lower Leg: Change dressing every  day. Wound #2 Right,Distal,Lateral Lower Leg: Change dressing every day. Follow-up Appointments: Wound #  1 Right,Proximal,Lateral Lower Leg: Return Appointment in 1 week. Wound #2 Right,Distal,Lateral Lower Leg: Return Appointment in 1 week. Edema Control: Wound #1 Right,Proximal,Lateral Lower Leg: Elevate legs to the level of the heart and pump ankles as often as possible Wound #2 Right,Distal,Lateral Lower Leg: Elevate legs to the level of the heart and pump ankles as often as possible Additional Orders / Instructions: Wound #1 Right,Proximal,Lateral Lower Leg: Increase protein intake. Activity as tolerated Wound #2 Right,Distal,Lateral Lower Leg: Increase protein intake. Activity as tolerated [name], [name]. ([id number]) After review have recommended: 1. Santyl ointment locally to be applied daily and a bordered foam over this. 2. elevation and exercise. 3. regular review at the wound center Electronic Signature(s) Signed: 06/15/2016 12:07:04 PM By: Christin Fudge MD, FACS Entered By: Christin Fudge on 06/15/2016 12:07:03 Bille, [name] ([id number]) -------------------------------------------------------------------------------- SuperBill Details [name]. Date of Service: 06/15/2016 Medical Record Number: [id number] Patient Account Number: [id number] Date of Birth/Sex: 1956/06/04 (60 y.o. Female) Treating RN: Afful, RN, BSN, Velva Harman Primary Care Physician: Dossie Arbour Other Clinician: Referring Physician: Dossie Arbour Treating Physician/Extender: Frann Rider in Treatment: 1 Diagnosis Coding ICD-10 Codes Code Description [phone] Insect bite (nonvenomous), right lower leg, initial encounter 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 layer exposed Facility Procedures CPT4 Code: [id number] Description: [facility] TISSUE 20 SQ CM/< ICD-10 Description Diagnosis S80.861A Insect bite (nonvenomous), right lower leg, initia L97.212  Non-pressure chronic ulcer of right calf with fat Modifier: l encounter layer exposed Quantity: 1 Physician Procedures CPT4 Code: [id number] Description: [id number] - WC PHYS SUBQ TISS 20 SQ CM ICD-10 Description Diagnosis S80.861A Insect bite (nonvenomous), right lower leg, initia 

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
## 2016-06-17 ENCOUNTER — Encounter (HOSPITAL_BASED_OUTPATIENT_CLINIC_OR_DEPARTMENT_OTHER): Payer: Commercial Managed Care - HMO

## 2016-06-21 ENCOUNTER — Ambulatory Visit: Payer: Commercial Managed Care - HMO | Admitting: Surgery

## 2016-06-23 ENCOUNTER — Encounter (INDEPENDENT_AMBULATORY_CARE_PROVIDER_SITE_OTHER): Payer: Commercial Managed Care - HMO | Admitting: Podiatry

## 2016-06-23 NOTE — Progress Notes (Signed)
This encounter was created in error - please disregard.

## 2016-06-25 ENCOUNTER — Encounter: Payer: Commercial Managed Care - HMO | Admitting: Surgery

## 2016-06-25 DIAGNOSIS — I110 Hypertensive heart disease with heart failure: Secondary | ICD-10-CM | POA: Diagnosis not present

## 2016-06-25 DIAGNOSIS — S81801A Unspecified open wound, right lower leg, initial encounter: Secondary | ICD-10-CM | POA: Diagnosis not present

## 2016-06-25 DIAGNOSIS — Z79899 Other long term (current) drug therapy: Secondary | ICD-10-CM | POA: Diagnosis not present

## 2016-06-25 DIAGNOSIS — I509 Heart failure, unspecified: Secondary | ICD-10-CM | POA: Diagnosis not present

## 2016-06-25 DIAGNOSIS — D649 Anemia, unspecified: Secondary | ICD-10-CM | POA: Diagnosis not present

## 2016-06-25 DIAGNOSIS — Z87891 Personal history of nicotine dependence: Secondary | ICD-10-CM | POA: Diagnosis not present

## 2016-06-25 DIAGNOSIS — S80861A Insect bite (nonvenomous), right lower leg, initial encounter: Secondary | ICD-10-CM | POA: Diagnosis not present

## 2016-06-25 DIAGNOSIS — L97212 Non-pressure chronic ulcer of right calf with fat layer exposed: Secondary | ICD-10-CM | POA: Diagnosis not present

## 2016-06-25 DIAGNOSIS — K219 Gastro-esophageal reflux disease without esophagitis: Secondary | ICD-10-CM | POA: Diagnosis not present
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
## 2016-06-26 NOTE — Progress Notes (Signed)
[name], [name] ([id number]) Visit Report for 06/25/2016 Chief Complaint Document Details [name], [name]. Date of Service: 06/25/2016 12:45 PM Medical Record Number: [id number] Patient Account Number: [id number] Date of Birth/Sex: 1956-09-26 (60 y.o. Female) Treating RN: Afful, RN, BSN, Velva Harman Primary Care Physician: Dossie Arbour Other Clinician: Referring Physician: Dossie Arbour Treating Physician/Extender: Frann Rider in Treatment: 2 Information Obtained from: Patient Chief Complaint Patient presents to the wound care center for a consult due non healing wound the right lower extremity which she's had for about a month Electronic Signature(s) Signed: 06/25/2016 1:10:17 PM By: Christin Fudge MD, FACS Entered By: Christin Fudge on 06/25/2016 13:10:17 [name], [name] ([id number]) -------------------------------------------------------------------------------- Debridement Details [name]. Date of Service: 06/25/2016 12:45 PM Medical Record Number: [id number] Patient Account Number: [id number] Date of Birth/Sex: 1956-06-28 (60 y.o. Female) Treating RN: Afful, RN, BSN, Velva Harman Primary Care Physician: Dossie Arbour Other Clinician: Referring Physician: Dossie Arbour Treating Physician/Extender: Frann Rider in Treatment: 2 Debridement Performed for Wound #1 Right,Proximal,Lateral Lower Leg Assessment: Performed By: Physician Christin Fudge, MD Debridement: Debridement Pre-procedure Yes - 12:58 Verification/Time Out Taken: Start Time: 12:58 Pain Control: Lidocaine 4% Topical Solution Level: Skin/Subcutaneous Tissue Total Area Debrided (L x 1.6 (cm) x 1.3 (cm) = 2.08 (cm) W): Tissue and other Viable, Non-Viable, Fibrin/Slough, Subcutaneous material debrided: Instrument: Curette Bleeding: Minimum Hemostasis Achieved: Pressure End Time: 13:01 Procedural Pain: 0 Post Procedural Pain: 0 Response to Treatment: Procedure was tolerated  well Post Debridement Measurements of Total Wound Length: (cm) 1.6 Width: (cm) 1.3 Depth: (cm) 0.1 Volume: (cm) 0.163 Character of Wound/Ulcer Post Requires Further Debridement Debridement: Severity of Tissue Post Debridement: Fat layer exposed Post Procedure Diagnosis Same as Pre-procedure Electronic Signature(s) Signed: 06/25/2016 1:10:10 PM By: Christin Fudge MD, FACS Signed: 06/25/2016 3:54:21 PM By: Regan Lemming BSN, RN Previous Signature: 06/25/2016 1:09:41 PM Version By: Christin Fudge MD, FACS Entered By: Christin Fudge on 06/25/2016 13:10:10 [name], [name] ([id number]) [name], [name] ([id number]) -------------------------------------------------------------------------------- HPI Details [name]. Date of Service: 06/25/2016 12:45 PM Medical Record Number: [id number] Patient Account Number: [id number] Date of Birth/Sex: 06-Apr-1956 (60 y.o. Female) Treating RN: Baruch Gouty, RN, BSN, Velva Harman Primary Care Physician: Dossie Arbour Other Clinician: Referring Physician: Dossie Arbour Treating Physician/Extender: Frann Rider in Treatment: 2 History of Present Illness Location: right lower extremity Quality: Patient reports experiencing a sharp pain to affected area(s). Severity: Patient states wound are getting worse. Duration: Patient has had the wound for > 1 month prior to seeking treatment at the wound center Timing: Pain in wound is Intermittent (comes and goes Context: The wound occurred when the patient had a painful swelling which then started discharging pus and may have been an insect bite Modifying Factors: Other treatment(s) tried include:local antibiotic ointment and oral antibiotics Associated Signs and Symptoms: Patient reports having increase discharge. HPI Description: 60 year-old female who presented to her family medical doctor with a painful ulcer on the right leg which she has had for about 3 weeks. she was not sure whether she had a insect  bite and was initially prescribed antihistamines. Down the line she was also started on doxycycline. She continues to drain pus at the present time. Her past medical history is significant for hypertension, venous insufficiency, superficial thrombophlebitis, diverticulosis, osteoarthritis of both knees, status post gastric bypass, iron deficiency anemia,at his post laparoscopic subtotal colectomy for pancolonic diverticulosis, lumbar back pain. She had a venous duplex study done on 02/05/2016 which showed no DVT, SVT  or incompetence bilaterally. Electronic Signature(s) Signed: 06/25/2016 1:10:22 PM By: Christin Fudge MD, FACS Entered By: Christin Fudge on 06/25/2016 13:10:22 [name] ([id number]) -------------------------------------------------------------------------------- Physical Exam Details [name]. Date of Service: 06/25/2016 12:45 PM Medical Record Number: [id number] Patient Account Number: [id number] Date of Birth/Sex: 1955/11/06 (60 y.o. Female) Treating RN: Baruch Gouty, RN, BSN, Velva Harman Primary Care Physician: Dossie Arbour Other Clinician: Referring Physician: Dossie Arbour Treating Physician/Extender: Frann Rider in Treatment: 2 Constitutional . Pulse regular. Respirations normal and unlabored. Afebrile. . Eyes Nonicteric. Reactive to light. Ears, Nose, Mouth, and Throat Lips, teeth, and gums WNL.Marland Kitchen Moist mucosa without lesions. Neck supple and nontender. No palpable supraclavicular or cervical adenopathy. Normal sized without goiter. Respiratory WNL. No retractions.. Cardiovascular Pedal Pulses WNL. No clubbing, cyanosis or edema. Lymphatic No adneopathy. No adenopathy. No adenopathy. Musculoskeletal Adexa without tenderness or enlargement.. Digits and nails w/o clubbing, cyanosis, infection, petechiae, ischemia, or inflammatory conditions.. Integumentary (Hair, Skin) No suspicious lesions. No crepitus or fluctuance. No peri-wound warmth or  erythema. No masses.Marland Kitchen Psychiatric Judgement and insight Intact.. No evidence of depression, anxiety, or agitation.. Notes the wound continues to have subcutaneous debris and using a #3 curet Sharply debrided it and bleeding controlled with pressure Electronic Signature(s) Signed: 06/25/2016 1:11:38 PM By: Christin Fudge MD, FACS Entered By: Christin Fudge on 06/25/2016 13:11:38 [name], [name] ([id number]) -------------------------------------------------------------------------------- Physician Orders Details [name]. Date of Service: 06/25/2016 12:45 PM Medical Record Number: [id number] Patient Account Number: [id number] Date of Birth/Sex: [birth year] (60 y.o. Female) Treating RN: Baruch Gouty, RN, BSN, Velva Harman Primary Care Physician: Dossie Arbour Other Clinician: Referring Physician: Dossie Arbour Treating Physician/Extender: Frann Rider in Treatment: 2 Verbal / Phone Orders: Yes Clinician: Afful, RN, BSN, Rita Read Back and Verified: Yes Diagnosis Coding Wound Cleansing Wound #1 Right,Proximal,Lateral Lower Leg o Clean wound with Normal Saline. Wound #2 Right,Distal,Lateral Lower Leg o Clean wound with Normal Saline. Anesthetic Wound #1 Right,Proximal,Lateral Lower Leg o Topical Lidocaine 4% cream applied to wound bed prior to debridement Wound #2 Right,Distal,Lateral Lower Leg o Topical Lidocaine 4% cream applied to wound bed prior to debridement Skin Barriers/Peri-Wound Care Wound #1 Right,Proximal,Lateral Lower Leg o Skin Prep Wound #2 Right,Distal,Lateral Lower Leg o Skin Prep Primary Wound Dressing Wound #1 Right,Proximal,Lateral Lower Leg o Santyl Ointment Wound #2 Right,Distal,Lateral Lower Leg o Santyl Ointment Secondary Dressing Wound #1 Right,Proximal,Lateral Lower Leg o Dry Gauze o Non-adherent pad Wound #2 Right,Distal,Lateral Lower Leg o Dry Gauze o Non-adherent pad Cris[place], [name] ([id number]) Dressing Change  Frequency Wound #1 Right,Proximal,Lateral Lower Leg o Change dressing every day. Wound #2 Right,Distal,Lateral Lower Leg o Change dressing every day. Follow-up Appointments Wound #1 Right,Proximal,Lateral Lower Leg o Return Appointment in 1 week. Wound #2 Right,Distal,Lateral Lower Leg o Return Appointment in 1 week. Edema Control Wound #1 Right,Proximal,Lateral Lower Leg o Elevate legs to the level of the heart and pump ankles as often as possible Wound #2 Right,Distal,Lateral Lower Leg o Elevate legs to the level of the heart and pump ankles as often as possible Additional Orders / Instructions Wound #1 Right,Proximal,Lateral Lower Leg o Increase protein intake. o Activity as tolerated Wound #2 Right,Distal,Lateral Lower Leg o Increase protein intake. o Activity as tolerated Electronic Signature(s) Signed: 06/25/2016 3:54:21 PM By: Regan Lemming BSN, RN Signed: 06/25/2016 4:35:01 PM By: Christin Fudge MD, FACS Entered By: Regan Lemming on 06/25/2016 13:09:59 [name], [name] ([id number]) -------------------------------------------------------------------------------- Problem List Details Patient Name: EME, LARIVEE. Date of Service: 06/25/2016 12:45 PM Medical  Record Number: [id number] Patient Account Number: [id number] Date of Birth/Sex: May 03, 1956 (60 y.o. Female) Treating RN: Baruch Gouty, RN, BSN, Velva Harman Primary Care Physician: Dossie Arbour Other Clinician: Referring Physician: Dossie Arbour Treating Physician/Extender: Frann Rider in Treatment: 2 Active Problems ICD-10 Encounter Code Description Active Date Diagnosis [phone] Insect bite (nonvenomous), right lower leg, initial 06/08/2016 Yes encounter 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 layer 06/08/2016 Yes exposed Inactive Problems Resolved Problems Electronic Signature(s) Signed: 06/25/2016 1:09:27 PM By: Christin Fudge MD, FACS Entered By: Christin Fudge on 06/25/2016  13:09:27 Seeley, [name] ([id number]) -------------------------------------------------------------------------------- Progress Note Details [name]. Date of Service: 06/25/2016 12:45 PM Medical Record Number: [id number] Patient Account Number: [id number] Date of Birth/Sex: 02-15-1956 (60 y.o. Female) Treating RN: Afful, RN, BSN, Velva Harman Primary Care Physician: Dossie Arbour Other Clinician: Referring Physician: Dossie Arbour Treating Physician/Extender: Frann Rider in Treatment: 2 Subjective Chief Complaint Information obtained from Patient Patient presents to the wound care center for a consult due non healing wound the right lower extremity which she's had for about a month History of Present Illness (HPI) The following HPI elements were documented for the patient's wound: Location: right lower extremity Quality: Patient reports experiencing a sharp pain to affected area(s). Severity: Patient states wound are getting worse. Duration: Patient has had the wound for > 1 month prior to seeking treatment at the wound center Timing: Pain in wound is Intermittent (comes and goes Context: The wound occurred when the patient had a painful swelling which then started discharging pus and may have been an insect bite Modifying Factors: Other treatment(s) tried include:local antibiotic ointment and oral antibiotics Associated Signs and Symptoms: Patient reports having increase discharge. 60 year-old female who presented to her family medical doctor with a painful ulcer on the right leg which she has had for about 3 weeks. she was not sure whether she had a insect bite and was initially prescribed antihistamines. Down the line she was also started on doxycycline. She continues to drain pus at the present time. Her past medical history is significant for hypertension, venous insufficiency, superficial thrombophlebitis, diverticulosis, osteoarthritis of both knees, status  post gastric bypass, iron deficiency anemia,at his post laparoscopic subtotal colectomy for pancolonic diverticulosis, lumbar back pain. She had a venous duplex study done on 02/05/2016 which showed no DVT, SVT or incompetence bilaterally. Objective Constitutional Pulse regular. Respirations normal and unlabored. Afebrile. Vitals Time Taken: 12:47 PM, Height: 64 in, Weight: 182 lbs, BMI: 31.2, Temperature: 98.3 F, Pulse: 94 [name], Hannah M. ([id number]) bpm, Respiratory Rate: 17 breaths/min, Blood Pressure: 158/82 mmHg. Eyes Nonicteric. Reactive to light. Ears, Nose, Mouth, and Throat Lips, teeth, and gums WNL.Marland Kitchen Moist mucosa without lesions. Neck supple and nontender. No palpable supraclavicular or cervical adenopathy. Normal sized without goiter. Respiratory WNL. No retractions.. Cardiovascular Pedal Pulses WNL. No clubbing, cyanosis or edema. Lymphatic No adneopathy. No adenopathy. No adenopathy. Musculoskeletal Adexa without tenderness or enlargement.. Digits and nails w/o clubbing, cyanosis, infection, petechiae, ischemia, or inflammatory conditions.Marland Kitchen Psychiatric Judgement and insight Intact.. No evidence of depression, anxiety, or agitation.. General Notes: the wound continues to have subcutaneous debris and using a #3 curet Sharply debrided it and bleeding controlled with pressure Integumentary (Hair, Skin) No suspicious lesions. No crepitus or fluctuance. No peri-wound warmth or erythema. No masses.. Wound #1 status is Open. Original cause of wound was Gradually Appeared. The wound is located on the Right,Proximal,Lateral Lower Leg. The wound measures 1.6cm length x 1.3cm width x 0.1cm depth; 1.634cm^2 area  and 0.163cm^3 volume. The wound is limited to skin breakdown. There is no tunneling or undermining noted. There is a medium amount of serosanguineous drainage noted. The wound margin is distinct with the outline attached to the wound base. There is medium (34-66%) pink,  pale granulation within the wound bed. There is a medium (34-66%) amount of necrotic tissue within the wound bed including Adherent Slough. The periwound skin appearance exhibited: Localized Edema, Moist, Hemosiderin Staining. The periwound skin appearance did not exhibit: Callus, Crepitus, Excoriation, Fluctuance, Friable, Induration, Rash, Scarring, Dry/Scaly, Maceration, Atrophie Blanche, Cyanosis, Ecchymosis, Mottled, Pallor, Rubor, Erythema. Periwound temperature was noted as No Abnormality. The periwound has tenderness on palpation. Wound #2 status is Open. Original cause of wound was Gradually Appeared. The wound is located on the Right,Distal,Lateral Lower Leg. The wound measures 1.4cm length x 1.2cm width x 0.1cm depth; 1.319cm^2 area and 0.132cm^3 volume. The wound is limited to skin breakdown. There is no tunneling or undermining noted. There is a medium amount of serosanguineous drainage noted. The wound margin is [name], [name]. ([id number]) distinct with the outline attached to the wound base. There is medium (34-66%) pink, pale granulation within the wound bed. There is a medium (34-66%) amount of necrotic tissue within the wound bed including Adherent Slough. The periwound skin appearance exhibited: Localized Edema, Moist, Hemosiderin Staining. The periwound skin appearance did not exhibit: Callus, Crepitus, Excoriation, Fluctuance, Friable, Induration, Rash, Scarring, Dry/Scaly, Maceration, Atrophie Blanche, Cyanosis, Ecchymosis, Mottled, Pallor, Rubor, Erythema. Periwound temperature was noted as No Abnormality. The periwound has tenderness on palpation. Assessment Active Problems ICD-10 S80.861A - Insect bite (nonvenomous), right lower leg, initial encounter L97.212 - Non-pressure chronic ulcer of right calf with fat layer exposed Procedures Wound #1 Wound #1 is a Trauma, Other located on the Right,Proximal,Lateral Lower Leg . There was a Skin/Subcutaneous Tissue  Debridement [id number]) debridement with total area of 2.08 sq cm performed by Christin Fudge, MD. with the following instrument(s): Curette to remove Viable and Non-Viable tissue/material including Fibrin/Slough and Subcutaneous after achieving pain control using Lidocaine 4% Topical Solution. A time out was conducted at 12:58, prior to the start of the procedure. A Minimum amount of bleeding was controlled with Pressure. The procedure was tolerated well with a pain level of 0 throughout and a pain level of 0 following the procedure. Post Debridement Measurements: 1.6cm length x 1.3cm width x 0.1cm depth; 0.163cm^3 volume. Character of Wound/Ulcer Post Debridement requires further debridement. Severity of Tissue Post Debridement is: Fat layer exposed. Post procedure Diagnosis Wound #1: Same as Pre-Procedure Plan Wound Cleansing: Wound #1 Right,Proximal,Lateral Lower Leg: Clean wound with Normal Saline. [name], [name] ([id number]) Wound #2 Right,Distal,Lateral Lower Leg: Clean wound with Normal Saline. Anesthetic: Wound #1 Right,Proximal,Lateral Lower Leg: Topical Lidocaine 4% cream applied to wound bed prior to debridement Wound #2 Right,Distal,Lateral Lower Leg: Topical Lidocaine 4% cream applied to wound bed prior to debridement Skin Barriers/Peri-Wound Care: Wound #1 Right,Proximal,Lateral Lower Leg: Skin Prep Wound #2 Right,Distal,Lateral Lower Leg: Skin Prep Primary Wound Dressing: Wound #1 Right,Proximal,Lateral Lower Leg: Santyl Ointment Wound #2 Right,Distal,Lateral Lower Leg: Santyl Ointment Secondary Dressing: Wound #1 Right,Proximal,Lateral Lower Leg: Dry Gauze Non-adherent pad Wound #2 Right,Distal,Lateral Lower Leg: Dry Gauze Non-adherent pad Dressing Change Frequency: Wound #1 Right,Proximal,Lateral Lower Leg: Change dressing every day. Wound #2 Right,Distal,Lateral Lower Leg: Change dressing every day. Follow-up Appointments: Wound #1  Right,Proximal,Lateral Lower Leg: Return Appointment in 1 week. Wound #2 Right,Distal,Lateral Lower Leg: Return Appointment in 1 week. Edema Control: Wound #1 Right,Proximal,Lateral Lower  Leg: Elevate legs to the level of the heart and pump ankles as often as possible Wound #2 Right,Distal,Lateral Lower Leg: Elevate legs to the level of the heart and pump ankles as often as possible Additional Orders / Instructions: Wound #1 Right,Proximal,Lateral Lower Leg: Increase protein intake. Activity as tolerated Wound #2 Right,Distal,Lateral Lower Leg: Increase protein intake. Activity as tolerated [name], [name]. ([id number]) After review have recommended: 1. Santyl ointment locally to be applied daily and a bordered foam over this. 2. elevation and exercise. 3. regular review at the wound center Electronic Signature(s) Signed: 06/25/2016 1:11:47 PM By: Christin Fudge MD, FACS Entered By: Christin Fudge on 06/25/2016 13:11:47 [name], [name] ([id number]) -------------------------------------------------------------------------------- SuperBill Details [name]. Date of Service: 06/25/2016 Medical Record Number: [id number] Patient Account Number: [id number] Date of Birth/Sex: 1956/07/27 (61 y.o. 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

## 2016-06-29 DIAGNOSIS — S81801A Unspecified open wound, right lower leg, initial encounter: Secondary | ICD-10-CM | POA: Diagnosis not present

## 2016-07-01 ENCOUNTER — Encounter: Payer: Commercial Managed Care - HMO | Admitting: Surgery

## 2016-07-01 DIAGNOSIS — K219 Gastro-esophageal reflux disease without esophagitis: Secondary | ICD-10-CM | POA: Diagnosis not present

## 2016-07-01 DIAGNOSIS — I110 Hypertensive heart disease with heart failure: Secondary | ICD-10-CM | POA: Diagnosis not present

## 2016-07-01 DIAGNOSIS — D649 Anemia, unspecified: Secondary | ICD-10-CM | POA: Diagnosis not present

## 2016-07-01 DIAGNOSIS — I509 Heart failure, unspecified: Secondary | ICD-10-CM | POA: Diagnosis not present

## 2016-07-01 DIAGNOSIS — L97212 Non-pressure chronic ulcer of right calf with fat layer exposed: Secondary | ICD-10-CM | POA: Diagnosis not present

## 2016-07-01 DIAGNOSIS — Z87891 Personal history of nicotine dependence: Secondary | ICD-10-CM | POA: Diagnosis not present

## 2016-07-01 DIAGNOSIS — S80861A Insect bite (nonvenomous), right lower leg, initial encounter: Secondary | ICD-10-CM | POA: Diagnosis not present

## 2016-07-01 DIAGNOSIS — Z79899 Other long term (current) drug therapy: Secondary | ICD-10-CM | POA: Diagnosis not present

## 2016-07-02 ENCOUNTER — Ambulatory Visit: Payer: Commercial Managed Care - HMO | Admitting: Surgery

## 2016-07-02 NOTE — Progress Notes (Signed)
[name], [name] ([id number]) Visit Report for 07/01/2016 Chief Complaint Document Details [name], [name]. Date of Service: 07/01/2016 8:00 AM Medical Record Number: [id number] Patient Account Number: [id number] Date of Birth/Sex: Feb 12, 1956 (60 y.o. Female) Treating RN: Carolyne Fiscal, Debi Primary Care Physician: Dossie Arbour Other Clinician: Referring Physician: Dossie Arbour Treating Physician/Extender: Frann Rider in Treatment: 3 Information Obtained from: Patient Chief Complaint Patient presents to the wound care center for a consult due non healing wound the right lower extremity which she's had for about a month Electronic Signature(s) Signed: 07/01/2016 8:23:48 AM By: Christin Fudge MD, FACS Entered By: Christin Fudge on 07/01/2016 08:23:48 Beshara, [name] ([id number]) -------------------------------------------------------------------------------- Debridement Details [name]. Date of Service: 07/01/2016 8:00 AM Medical Record Number: [id number] Patient Account Number: [id number] Date of Birth/Sex: 07/04/1956 (60 y.o. Female) Treating RN: Carolyne Fiscal, Debi Primary Care Physician: Dossie Arbour Other Clinician: Referring Physician: Dossie Arbour Treating Physician/Extender: Frann Rider in Treatment: 3 Debridement Performed for Wound #1 Right,Proximal,Lateral Lower Leg Assessment: Performed By: Physician Christin Fudge, MD Debridement: Debridement Pre-procedure Yes - 08:16 Verification/Time Out Taken: Start Time: 08:16 Pain Control: Lidocaine 4% Topical Solution Level: Skin/Subcutaneous Tissue Total Area Debrided (L x 1.6 (cm) x 1.1 (cm) = 1.76 (cm) W): Tissue and other Viable, Non-Viable, Exudate, Fibrin/Slough, Subcutaneous material debrided: Instrument: Curette Bleeding: Minimum Hemostasis Achieved: Pressure End Time: 08:20 Procedural Pain: 4 Post Procedural Pain: 4 Response to Treatment: Procedure was tolerated well Post  Debridement Measurements of Total Wound Length: (cm) 1.6 Width: (cm) 1.1 Depth: (cm) 0.1 Volume: (cm) 0.138 Character of Wound/Ulcer Post Requires Further Debridement Debridement: Severity of Tissue Post Debridement: Fat layer exposed Post Procedure Diagnosis Same as Pre-procedure Electronic Signature(s) Signed: 07/01/2016 8:23:29 AM By: Christin Fudge MD, FACS Signed: 07/01/2016 4:42:28 PM By: Alric Quan Entered By: Christin Fudge on 07/01/2016 08:23:29 [name], [name] ([id number]) [name], [name] ([id number]) -------------------------------------------------------------------------------- Debridement Details [name]. Date of Service: 07/01/2016 8:00 AM Medical Record Number: [id number] Patient Account Number: [id number] Date of Birth/Sex: 03-09-1956 (60 y.o. Female) Treating RN: Carolyne Fiscal, Debi Primary Care Physician: Dossie Arbour Other Clinician: Referring Physician: Dossie Arbour Treating Physician/Extender: Frann Rider in Treatment: 3 Debridement Performed for Wound #2 Right,Distal,Lateral Lower Leg Assessment: Performed By: Physician Christin Fudge, MD Debridement: Debridement Pre-procedure Yes - 08:16 Verification/Time Out Taken: Start Time: 08:16 Pain Control: Lidocaine 4% Topical Solution Level: Skin/Subcutaneous Tissue Total Area Debrided (L x 1.6 (cm) x 1 (cm) = 1.6 (cm) W): Tissue and other Viable, Non-Viable, Exudate, Fibrin/Slough, Subcutaneous material debrided: Instrument: Curette Bleeding: Minimum Hemostasis Achieved: Pressure End Time: 08:20 Procedural Pain: 4 Post Procedural Pain: 4 Response to Treatment: Procedure was tolerated well Post Debridement Measurements of Total Wound Length: (cm) 1.6 Width: (cm) 1 Depth: (cm) 0.1 Volume: (cm) 0.126 Character of Wound/Ulcer Post Stable Debridement: Severity of Tissue Post Debridement: Fat layer exposed Post Procedure Diagnosis Same as Pre-procedure Electronic  Signature(s) Signed: 07/01/2016 8:23:42 AM By: Christin Fudge MD, FACS Signed: 07/01/2016 4:42:28 PM By: Alric Quan Entered By: Christin Fudge on 07/01/2016 08:23:42 [name], [name] ([id number]) [name], [name] ([id number]) -------------------------------------------------------------------------------- HPI Details [name]. Date of Service: 07/01/2016 8:00 AM Medical Record Number: [id number] Patient Account Number: [id number] Date of Birth/Sex: [birth year] (60 y.o. Female) Treating RN: Carolyne Fiscal, Debi Primary Care Physician: Dossie Arbour Other Clinician: Referring Physician: Dossie Arbour Treating Physician/Extender: Frann Rider in Treatment: 3 History of Present Illness Location: right lower extremity Quality: Patient reports experiencing a sharp pain to  affected area(s). Severity: Patient states wound are getting worse. Duration: Patient has had the wound for > 1 month prior to seeking treatment at the wound center Timing: Pain in wound is Intermittent (comes and goes Context: The wound occurred when the patient had a painful swelling which then started discharging pus and may have been an insect bite Modifying Factors: Other treatment(s) tried include:local antibiotic ointment and oral antibiotics Associated Signs and Symptoms: Patient reports having increase discharge. HPI Description: 60 year-old female who presented to her family medical doctor with a painful ulcer on the right leg which she has had for about 3 weeks. she was not sure whether she had a insect bite and was initially prescribed antihistamines. Down the line she was also started on doxycycline. She continues to drain pus at the present time. Her past medical history is significant for hypertension, venous insufficiency, superficial thrombophlebitis, diverticulosis, osteoarthritis of both knees, status post gastric bypass, iron deficiency anemia,at his post laparoscopic subtotal  colectomy for pancolonic diverticulosis, lumbar back pain. She had a venous duplex study done on 02/05/2016 which showed no DVT, SVT or incompetence bilaterally. Electronic Signature(s) Signed: 07/01/2016 8:23:54 AM By: Christin Fudge MD, FACS Entered By: Christin Fudge on 07/01/2016 08:23:54 [name], [name] ([id number]) -------------------------------------------------------------------------------- Physical Exam Details [name]. Date of Service: 07/01/2016 8:00 AM Medical Record Number: [id number] Patient Account Number: [id number] Date of Birth/Sex: 1956-07-22 (60 y.o. Female) Treating RN: Carolyne Fiscal, Debi Primary Care Physician: Dossie Arbour Other Clinician: Referring Physician: Dossie Arbour Treating Physician/Extender: Frann Rider in Treatment: 3 Constitutional . Pulse regular. Respirations normal and unlabored. Afebrile. . Eyes Nonicteric. Reactive to light. Ears, Nose, Mouth, and Throat Lips, teeth, and gums WNL.Marland Kitchen Moist mucosa without lesions. Neck supple and nontender. No palpable supraclavicular or cervical adenopathy. Normal sized without goiter. Respiratory WNL. No retractions.. Cardiovascular Pedal Pulses WNL. ABIs were noncompressible. stage I lymphedema both lower extremities. Chest Breasts symmetical and no nipple discharge.. Breast tissue WNL, no masses, lumps, or tenderness.. Lymphatic No adneopathy. No adenopathy. No adenopathy. Musculoskeletal Adexa without tenderness or enlargement.. Digits and nails w/o clubbing, cyanosis, infection, petechiae, ischemia, or inflammatory conditions.. Integumentary (Hair, Skin) No suspicious lesions. No crepitus or fluctuance. No peri-wound warmth or erythema. No masses.Marland Kitchen Psychiatric Judgement and insight Intact.. No evidence of depression, anxiety, or agitation.. Notes both the wounds are shallow but have subcutaneous debris and a #3 curet was used to sharply debride this and bleeding was  controlled with pressure Electronic Signature(s) Signed: 07/01/2016 9:02:44 AM By: Christin Fudge MD, FACS Previous Signature: 07/01/2016 9:00:58 AM Version By: Christin Fudge MD, FACS Previous Signature: 07/01/2016 8:25:00 AM Version By: Christin Fudge MD, FACS Entered By: Christin Fudge on 07/01/2016 09:02:44 [name], [name] ([id number]) -------------------------------------------------------------------------------- Physician Orders Details [name]. Date of Service: 07/01/2016 8:00 AM Medical Record Number: [id number] Patient Account Number: [id number] Date of Birth/Sex: November 01, 1956 (60 y.o. Female) Treating RN: Baruch Gouty, RN, BSN, Velva Harman Primary Care Physician: Dossie Arbour Other Clinician: Referring Physician: Dossie Arbour Treating Physician/Extender: Frann Rider in Treatment: 3 Verbal / Phone Orders: Yes Clinician: Afful, RN, BSN, Rita Read Back and Verified: Yes Diagnosis Coding Wound Cleansing Wound #1 Right,Proximal,Lateral Lower Leg o Clean wound with Normal Saline. Wound #2 Right,Distal,Lateral Lower Leg o Clean wound with Normal Saline. Anesthetic Wound #1 Right,Proximal,Lateral Lower Leg o Topical Lidocaine 4% cream applied to wound bed prior to debridement Wound #2 Right,Distal,Lateral Lower Leg o Topical Lidocaine 4% cream applied to wound bed prior to debridement Skin  Barriers/Peri-Wound Care Wound #1 Right,Proximal,Lateral Lower Leg o Skin Prep Wound #2 Right,Distal,Lateral Lower Leg o Barrier cream Primary Wound Dressing Wound #1 Right,Proximal,Lateral Lower Leg o Santyl Ointment Wound #2 Right,Distal,Lateral Lower Leg o Santyl Ointment Secondary Dressing Wound #1 Right,Proximal,Lateral Lower Leg o Dry Gauze o Non-adherent pad Wound #2 Right,Distal,Lateral Lower Leg o Dry Gauze o Non-adherent pad Grzelak, [name] ([id number]) Dressing Change Frequency Wound #1 Right,Proximal,Lateral Lower Leg o Change dressing  every day. Wound #2 Right,Distal,Lateral Lower Leg o Change dressing every day. Follow-up Appointments Wound #1 Right,Proximal,Lateral Lower Leg o Return Appointment in 1 week. Wound #2 Right,Distal,Lateral Lower Leg o Return Appointment in 1 week. Edema Control Wound #1 Right,Proximal,Lateral Lower Leg o Elevate legs to the level of the heart and pump ankles as often as possible Wound #2 Right,Distal,Lateral Lower Leg o Patient to wear own compression stockings o Elevate legs to the level of the heart and pump ankles as often as possible Additional Orders / Instructions Wound #1 Right,Proximal,Lateral Lower Leg o Increase protein intake. o Activity as tolerated Wound #2 Right,Distal,Lateral Lower Leg o Increase protein intake. o Activity as tolerated Electronic Signature(s) Signed: 07/01/2016 4:38:34 PM By: Christin Fudge MD, FACS Signed: 07/01/2016 4:42:28 PM By: Alric Quan Entered By: Alric Quan on 07/01/2016 08:35:27 [name], [name] ([id number]) -------------------------------------------------------------------------------- Problem List Details Patient Name: KONICA, MCHARG. Date of Service: 07/01/2016 8:00 AM Medical Record Number: [id number] Patient Account Number: [id number] Date of Birth/Sex: 15-Feb-1956 (60 y.o. 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 Date of Service: 07/01/2016 8:00 AM Medical Record Number: [id number] Patient Account Number: [id number] Date of Birth/Sex: April 02, 1956 (60 y.o. Female) Treating RN: Carolyne Fiscal, Debi Primary Care Physician: Dossie Arbour Other Clinician: Referring Physician: Dossie Arbour Treating Physician/Extender: Frann Rider in Treatment: 3 Subjective Chief Complaint Information obtained from Patient Patient presents to the wound care center for a consult due non healing wound the right lower extremity which she's had for about a month History of Present Illness (HPI) The following HPI elements were documented for the patient's wound: Location: right lower extremity Quality: Patient reports experiencing a sharp pain to affected area(s). Severity: Patient states wound are getting worse. Duration: Patient has had the wound for > 1 month prior to seeking treatment at the wound center Timing: Pain in wound is Intermittent (comes and goes Context: The wound occurred when the patient had a painful swelling which then started discharging pus and may have been an insect bite Modifying Factors: Other treatment(s) tried include:local antibiotic ointment and oral antibiotics Associated Signs and Symptoms: Patient reports having increase discharge. 60 year-old female who presented to her family medical doctor with a painful ulcer on the right leg which she has had for about 3 weeks. she was not sure whether she had a insect bite and was initially prescribed antihistamines. Down the line she was also started on doxycycline. She continues to drain pus at the present time. Her past medical history is significant for  hypertension, venous insufficiency, superficial thrombophlebitis, diverticulosis, osteoarthritis of both knees,  status post gastric bypass, iron deficiency anemia,at his post laparoscopic subtotal colectomy for pancolonic diverticulosis, lumbar back pain. She had a venous duplex study done on 02/05/2016 which showed no DVT, SVT or incompetence bilaterally. Objective Constitutional Pulse regular. Respirations normal and unlabored. Afebrile. Vitals Time Taken: 8:04 AM, Height: 64 in, Weight: 182 lbs, BMI: 31.2, Temperature: 98.2 F, Pulse: 83 [name], Hannah M. ([id number]) bpm, Respiratory Rate: 18 breaths/min, Blood Pressure: 142/84 mmHg. Eyes Nonicteric. Reactive to light. Ears, Nose, Mouth, and Throat Lips, teeth, and gums WNL.Marland Kitchen Moist mucosa without lesions. Neck supple and nontender. No palpable supraclavicular or cervical adenopathy. Normal sized without goiter. Respiratory WNL. No retractions.. Cardiovascular Pedal Pulses WNL. ABIs were noncompressible. stage I lymphedema both lower extremities. Chest Breasts symmetical and no nipple discharge.. Breast tissue WNL, no masses, lumps, or tenderness.. Lymphatic No adneopathy. No adenopathy. No adenopathy. Musculoskeletal Adexa without tenderness or enlargement.. Digits and nails w/o clubbing, cyanosis, infection, petechiae, ischemia, or inflammatory conditions.Marland Kitchen Psychiatric Judgement and insight Intact.. No evidence of depression, anxiety, or agitation.. General Notes: both the wounds are shallow but have subcutaneous debris and a #3 curet was used to sharply debride this and bleeding was controlled with pressure Integumentary (Hair, Skin) No suspicious lesions. No crepitus or fluctuance. No peri-wound warmth or erythema. No masses.. Wound #1 status is Open. Original cause of wound was Gradually Appeared. The wound is located on the Right,Proximal,Lateral Lower Leg. The wound measures 1.6cm length x 1.1cm width x 0.2cm depth; 1.382cm^2 area and 0.276cm^3 volume. The wound is limited to skin breakdown. There is no tunneling or undermining  noted. There is a large amount of serosanguineous drainage noted. The wound margin is distinct with the outline attached to the wound base. There is no granulation within the wound bed. There is a large (67-100%) amount of necrotic tissue within the wound bed including Adherent Slough. The periwound skin appearance exhibited: Localized Edema, Moist, Hemosiderin Staining. The periwound skin appearance did not exhibit: Callus, Crepitus, Excoriation, Fluctuance, Friable, Induration, Rash, Scarring, Dry/Scaly, Maceration, Atrophie Blanche, Cyanosis, Ecchymosis, Mottled, Pallor, Rubor, Erythema. Periwound temperature was noted as No Abnormality. The periwound has tenderness on palpation. Wound #2 status is Open. Original cause of wound was Gradually Appeared. The wound is located on the Right,Distal,Lateral Lower Leg. The wound measures 1.6cm length x 1cm width x 0.1cm depth; 1.257cm^2 [name], Hannah M. ([id number]) area and 0.126cm^3 volume. The wound is limited to skin breakdown. There is no tunneling or undermining noted. There is a large amount of serosanguineous drainage noted. The wound margin is distinct with the outline attached to the wound base. There is no granulation within the wound bed. There is a large (67- 100%) amount of necrotic tissue within the wound bed including Adherent Slough. The periwound skin appearance exhibited: Localized Edema, Moist, Hemosiderin Staining. The periwound skin appearance did not exhibit: Callus, Crepitus, Excoriation, Fluctuance, Friable, Induration, Rash, Scarring, Dry/Scaly, Maceration, Atrophie Blanche, Cyanosis, Ecchymosis, Mottled, Pallor, Rubor, Erythema. Periwound temperature was noted as No Abnormality. The periwound has tenderness on palpation. Assessment Active Problems ICD-10 S80.861A - Insect bite (nonvenomous), right lower leg, initial encounter L97.212 - Non-pressure chronic ulcer of right calf with fat layer exposed I89.0 - Lymphedema, not  elsewhere classified Procedures Wound #1 Wound #1 is a Venous Leg Ulcer located on the Right,Proximal,Lateral Lower Leg . There was a Skin/Subcutaneous Tissue Debridement [id number]) debridement with total area of 1.76 sq cm performed by Christin Fudge, MD. with the  following instrument(s): Curette to remove Viable and Non-Viable tissue/material including Exudate, Fibrin/Slough, and Subcutaneous after achieving pain control using Lidocaine 4% Topical Solution. A time out was conducted at 08:16, prior to the start of the procedure. A Minimum amount of bleeding was controlled with Pressure. The procedure was tolerated well with a pain level of 4 throughout and a pain level of 4 following the procedure. Post Debridement Measurements: 1.6cm length x 1.1cm width x 0.1cm depth; 0.138cm^3 volume. Character of Wound/Ulcer Post Debridement requires further debridement. Severity of Tissue Post Debridement is: Fat layer exposed. Post procedure Diagnosis Wound #1: Same as Pre-Procedure Wound #2 Wound #2 is a Venous Leg Ulcer located on the Right,Distal,Lateral Lower Leg . There was a Skin/Subcutaneous Tissue Debridement [id number]) debridement with total area of 1.6 sq cm performed by Christin Fudge, MD. with the following instrument(s): Curette to remove Viable and Non-Viable tissue/material including Exudate, Fibrin/Slough, and Subcutaneous after achieving pain control using Lidocaine 4% Topical Solution. A time out was conducted at 08:16, prior to the start of the procedure. A Minimum amount of bleeding was controlled with Pressure. The procedure was tolerated well with a pain level of 4 throughout and a pain level of 4 following the procedure. Post Debridement Measurements: 1.6cm [name], [name]. ([id number]) length x 1cm width x 0.1cm depth; 0.126cm^3 volume. Character of Wound/Ulcer Post Debridement is stable. Severity of Tissue Post Debridement is: Fat layer exposed. Post procedure Diagnosis  Wound #2: Same as Pre-Procedure Plan Wound Cleansing: Wound #1 Right,Proximal,Lateral Lower Leg: Clean wound with Normal Saline. Wound #2 Right,Distal,Lateral Lower Leg: Clean wound with Normal Saline. Anesthetic: Wound #1 Right,Proximal,Lateral Lower Leg: Topical Lidocaine 4% cream applied to wound bed prior to debridement Wound #2 Right,Distal,Lateral Lower Leg: Topical Lidocaine 4% cream applied to wound bed prior to debridement Skin Barriers/Peri-Wound Care: Wound #1 Right,Proximal,Lateral Lower Leg: Skin Prep Wound #2 Right,Distal,Lateral Lower Leg: Barrier cream Primary Wound Dressing: Wound #1 Right,Proximal,Lateral Lower Leg: Santyl Ointment Wound #2 Right,Distal,Lateral Lower Leg: Santyl Ointment Secondary Dressing: Wound #1 Right,Proximal,Lateral Lower Leg: Dry Gauze Non-adherent pad Wound #2 Right,Distal,Lateral Lower Leg: Dry Gauze Non-adherent pad Dressing Change Frequency: Wound #1 Right,Proximal,Lateral Lower Leg: Change dressing every day. Wound #2 Right,Distal,Lateral Lower Leg: Change dressing every day. Follow-up Appointments: Wound #1 Right,Proximal,Lateral Lower Leg: Return Appointment in 1 week. Wound #2 Right,Distal,Lateral Lower Leg: Return Appointment in 1 week. Edema Control: [name], [name] ([id number]) Wound #1 Right,Proximal,Lateral Lower Leg: Elevate legs to the level of the heart and pump ankles as often as possible Wound #2 Right,Distal,Lateral Lower Leg: Patient to wear own compression stockings Elevate legs to the level of the heart and pump ankles as often as possible Additional Orders / Instructions: Wound #1 Right,Proximal,Lateral Lower Leg: Increase protein intake. Activity as tolerated Wound #2 Right,Distal,Lateral Lower Leg: Increase protein intake. Activity as tolerated After review have recommended: 1. Santyl ointment locally to be applied daily and a bordered foam over this. 2. she has got hold of compression stockings  of the 20-30 mm variety and she is wearing these daily. 3. The ABI's were noncompressible today and hence we will order arterial duplex studies both lower extremities 4. elevation and exercise. 5. regular review at the wound center Electronic Signature(s) Signed: 07/01/2016 9:02:58 AM By: Christin Fudge MD, FACS Previous Signature: 07/01/2016 9:02:09 AM Version By: Christin Fudge MD, FACS Entered By: Christin Fudge on 07/01/2016 09:02:58 Stmarie, [name] ([id number]) -------------------------------------------------------------------------------- SuperBill Details [name]. Date of Service: 07/01/2016 Medical Record Number: [id number] Patient Account  Number: [id number] Date of Birth/Sex: June 01, 1956 (60 y.o. 

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
## 2016-07-06 ENCOUNTER — Encounter (HOSPITAL_BASED_OUTPATIENT_CLINIC_OR_DEPARTMENT_OTHER): Payer: Commercial Managed Care - HMO

## 2016-07-07 ENCOUNTER — Telehealth: Payer: Self-pay | Admitting: Family Medicine

## 2016-07-07 ENCOUNTER — Other Ambulatory Visit: Payer: Self-pay | Admitting: Vascular Surgery

## 2016-07-07 DIAGNOSIS — L97212 Non-pressure chronic ulcer of right calf with fat layer exposed: Secondary | ICD-10-CM

## 2016-07-07 MED ORDER — TRAMADOL HCL 50 MG PO TABS: 50.0000 mg | ORAL_TABLET | Freq: Three times a day (TID) | ORAL | 1 refills | Status: DC | PRN

## 2016-07-07 NOTE — Telephone Encounter (Signed)
Rx called in to pharmacy and pt informed. Deseree Kennon Holter, CMA

## 2016-07-07 NOTE — Telephone Encounter (Signed)
Pt is being seen by the wound center for the sore on her leg. They will not prescribe anything for pain. She is not able to sleep at night because of the pain. Pt would like something called in for pain.  [employer].

## 2016-07-07 NOTE — Telephone Encounter (Signed)
RN staff - please call in Tramadol 50 mg Q8 hours prn pain, dispense #60, refill #1. Please also call patient to inform her that this medication was sent to pharmacy.

## 2016-07-09 ENCOUNTER — Other Ambulatory Visit: Payer: Self-pay

## 2016-07-09 ENCOUNTER — Encounter: Payer: Commercial Managed Care - HMO | Attending: Surgery | Admitting: Surgery

## 2016-07-09 DIAGNOSIS — S80861A Insect bite (nonvenomous), right lower leg, initial encounter: Secondary | ICD-10-CM | POA: Diagnosis not present

## 2016-07-09 DIAGNOSIS — X58XXXA Exposure to other specified factors, initial encounter: Secondary | ICD-10-CM | POA: Insufficient documentation

## 2016-07-09 DIAGNOSIS — M170 Bilateral primary osteoarthritis of knee: Secondary | ICD-10-CM | POA: Insufficient documentation

## 2016-07-09 DIAGNOSIS — D509 Iron deficiency anemia, unspecified: Secondary | ICD-10-CM | POA: Diagnosis not present

## 2016-07-09 DIAGNOSIS — L97212 Non-pressure chronic ulcer of right calf with fat layer exposed: Secondary | ICD-10-CM | POA: Insufficient documentation

## 2016-07-09 DIAGNOSIS — L97219 Non-pressure chronic ulcer of right calf with unspecified severity: Secondary | ICD-10-CM | POA: Diagnosis not present

## 2016-07-09 DIAGNOSIS — I10 Essential (primary) hypertension: Secondary | ICD-10-CM | POA: Insufficient documentation

## 2016-07-09 DIAGNOSIS — Z9884 Bariatric surgery status: Secondary | ICD-10-CM | POA: Insufficient documentation

## 2016-07-09 DIAGNOSIS — I890 Lymphedema, not elsewhere classified: Secondary | ICD-10-CM | POA: Insufficient documentation

## 2016-07-09 DIAGNOSIS — K5790 Diverticulosis of intestine, part unspecified, without perforation or abscess without bleeding: Secondary | ICD-10-CM | POA: Insufficient documentation

## 2016-07-09 NOTE — Patient Outreach (Signed)
[facility]) Care Management  07/09/2016  [name] 1956-07-17 [id number]   Telephone call to patient for monthly call. No answer.  HIPAA compliant voice message left.   Plan: RN Health Coach will attempt patient again in the month of September.    Jone Baseman, RN, MSN [place] [phone]
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
## 2016-07-10 NOTE — Progress Notes (Signed)
[name] ([id number]) Visit Report for 07/09/2016 Biopsy Details [name], [name]. Date of Service: 07/09/2016 8:00 AM Medical Record Number: [id number] Patient Account Number: [id number] Date of Birth/Sex: [birth year] (60 y.o. Female) Treating RN: Baruch Gouty, RN, BSN, Velva Harman Primary Care Physician: [name] Other Clinician: Referring Physician: Dossie [name] Treating Physician/Extender: Frann Rider in Treatment: 4 Biopsy Performed for: Wound #1 Right, Proximal, Lateral Lower Leg Location(s): Wound Margin Performed By: Physician Christin Fudge, MD Tissue Punch: No Number of Specimens Taken: 4 Specimen Sent To Pathology: Yes Pre-procedure Verification/Time-Out Yes - 08:20 Taken: Pain Control: Lidocaine Injectable Lidocaine Percent: 2% Instrument: Blade, Forceps Bleeding: Moderate Hemostasis Achieved: Silver Nitrate Procedural Pain: 0 Post Procedural Pain: 0 Response to Treatment: Procedure was tolerated well Post Procedure Diagnosis Same as Pre-procedure Electronic Signature(s) Signed: 07/09/2016 8:43:33 AM By: Christin Fudge MD, FACS Entered By: Christin Fudge on 07/09/2016 08:43:33 [name], [name] ([id number]) -------------------------------------------------------------------------------- Chief Complaint Document Details [name]. Date of Service: 07/09/2016 8:00 AM Medical Record Number: [id number] Patient Account Number: [id number] Date of Birth/Sex: [birth year] (60 y.o. Female) Treating RN: Afful, RN, BSN, Velva Harman Primary Care Physician: [name] Other Clinician: Referring Physician: Dossie [name] Treating Physician/Extender: Frann Rider in Treatment: 4 Information Obtained from: Patient Chief Complaint Patient presents to the wound care center for a consult due non healing wound the right lower extremity which she's had for about a month Electronic Signature(s) Signed: 07/09/2016 8:43:40 AM By: Christin Fudge MD, FACS Entered  By: Christin Fudge on 07/09/2016 08:43:40 [name], [name] ([id number]) -------------------------------------------------------------------------------- HPI Details [name]. Date of Service: 07/09/2016 8:00 AM Medical Record Number: [id number] Patient Account Number: [id number] Date of Birth/Sex: [birth year] (60 y.o. Female) Treating RN: Baruch Gouty, RN, BSN, Velva Harman Primary Care Physician: [name] Other Clinician: Referring Physician: Dossie [name] Treating Physician/Extender: Frann Rider in Treatment: 4 History of Present Illness Location: right lower extremity Quality: Patient reports experiencing a sharp pain to affected area(s). Severity: Patient states wound are getting worse. Duration: Patient has had the wound for > 1 month prior to seeking treatment at the wound center Timing: Pain in wound is Intermittent (comes and goes Context: The wound occurred when the patient had a painful swelling which then started discharging pus and may have been an insect bite Modifying Factors: Other treatment(s) tried include:local antibiotic ointment and oral antibiotics Associated Signs and Symptoms: Patient reports having increase discharge. HPI Description: 60 year-old female who presented to her family medical doctor with a painful ulcer on the right leg which she has had for about 3 weeks. she was not sure whether she had a insect bite and was initially prescribed antihistamines. Down the line she was also started on doxycycline. She continues to drain pus at the present time. Her past medical history is significant for hypertension, venous insufficiency, superficial thrombophlebitis, diverticulosis, osteoarthritis of both knees, status post gastric bypass, iron deficiency anemia,at his post laparoscopic subtotal colectomy for pancolonic diverticulosis, lumbar back pain. She had a venous duplex study done on 02/05/2016 which showed no DVT, SVT or  incompetence bilaterally. 07/09/2016 -- continues to have a lot of pain in the right lateral lower leg where the ulcers are getting more superficial but larger. Arterial studies are still pending and at this stage I'm concerned about pyoderma gangrenosum and will get biopsies of the wound edge Electronic Signature(s) Signed: 07/09/2016 8:45:02 AM By: Christin Fudge MD, FACS Entered By: Christin Fudge on 07/09/2016 08:45:02 [name], [name] ([id number]) -------------------------------------------------------------------------------- Physical  Exam Details [name], CLURE. Date of Service: 07/09/2016 8:00 AM Medical Record Number: [id number] Patient Account Number: [id number] Date of Birth/Sex: 1956/05/31 (60 y.o. Female) Treating RN: Baruch Gouty, RN, BSN, Velva Harman Primary Care Physician: [name] Other Clinician: Referring Physician: Dossie [name] Treating Physician/Extender: Frann Rider in Treatment: 4 Constitutional . Pulse regular. Respirations normal and unlabored. Afebrile. . Eyes Nonicteric. Reactive to light. Ears, Nose, Mouth, and Throat Lips, teeth, and gums WNL.Marland Kitchen Moist mucosa without lesions. Neck supple and nontender. No palpable supraclavicular or cervical adenopathy. Normal sized without goiter. Respiratory WNL. No retractions.. Cardiovascular Pedal Pulses WNL. No clubbing, cyanosis or edema. Lymphatic No adneopathy. No adenopathy. No adenopathy. Musculoskeletal Adexa without tenderness or enlargement.. Digits and nails w/o clubbing, cyanosis, infection, petechiae, ischemia, or inflammatory conditions.. Integumentary (Hair, Skin) No suspicious lesions. No crepitus or fluctuance. No peri-wound warmth or erythema. No masses.Marland Kitchen Psychiatric Judgement and insight Intact.. No evidence of depression, anxiety, or agitation.. Notes the wounds continue to be shallow but are spreading a bit and there are edges which are everted and I'm concerned about pyoderma  gangrenosum and will take wedge biopsies of the wound margins. Bleeding controlled with Silver nitrate. Electronic Signature(s) Signed: 07/09/2016 8:46:26 AM By: Christin Fudge MD, FACS Entered By: Christin Fudge on 07/09/2016 08:46:26 [name], [name] ([id number]) -------------------------------------------------------------------------------- Physician Orders Details [name]. Date of Service: 07/09/2016 8:00 AM Medical Record Number: [id number] Patient Account Number: [id number] Date of Birth/Sex: 07-Sep-1956 (60 y.o. Female) Treating RN: Baruch Gouty, RN, BSN, Velva Harman Primary Care Physician: [name] Other Clinician: Referring Physician: Dossie [name] Treating Physician/Extender: Frann Rider in Treatment: 4 Verbal / Phone Orders: Yes Clinician: Afful, RN, BSN, Rita Read Back and Verified: Yes Diagnosis Coding Wound Cleansing Wound #1 Right,Proximal,Lateral Lower Leg o Clean wound with Normal Saline. Wound #2 Right,Distal,Lateral Lower Leg o Clean wound with Normal Saline. Anesthetic Wound #1 Right,Proximal,Lateral Lower Leg o Topical Lidocaine 4% cream applied to wound bed prior to debridement Wound #2 Right,Distal,Lateral Lower Leg o Topical Lidocaine 4% cream applied to wound bed prior to debridement Skin Barriers/Peri-Wound Care Wound #1 Right,Proximal,Lateral Lower Leg o Skin Prep Wound #2 Right,Distal,Lateral Lower Leg o Barrier cream Primary Wound Dressing Wound #1 Right,Proximal,Lateral Lower Leg o Santyl Ointment Wound #2 Right,Distal,Lateral Lower Leg o Santyl Ointment Secondary Dressing Wound #1 Right,Proximal,Lateral Lower Leg o Dry Gauze o Non-adherent pad Wound #2 Right,Distal,Lateral Lower Leg o Dry Gauze o Non-adherent pad Hoskin, [name] ([id number]) Dressing Change Frequency Wound #1 Right,Proximal,Lateral Lower Leg o Change dressing every day. Wound #2 Right,Distal,Lateral Lower Leg o Change dressing  every day. Follow-up Appointments Wound #1 Right,Proximal,Lateral Lower Leg o Return Appointment in 1 week. Wound #2 Right,Distal,Lateral Lower Leg o Return Appointment in 1 week. Edema Control Wound #1 Right,Proximal,Lateral Lower Leg o Elevate legs to the level of the heart and pump ankles as often as possible Wound #2 Right,Distal,Lateral Lower Leg o Patient to wear own compression stockings o Elevate legs to the level of the heart and pump ankles as often as possible Additional Orders / Instructions Wound #1 Right,Proximal,Lateral Lower Leg o Increase protein intake. o Activity as tolerated Wound #2 Right,Distal,Lateral Lower Leg o Increase protein intake. o Activity as tolerated Laboratory o Tissue Pathology biopsy report (PATH) - Biopsy of right lateral lower leg oooo LOINC Code: [id number] oooo Convenience Name: [facility] Bx report Electronic Signature(s) Signed: 07/09/2016 1:43:20 PM By: Christin Fudge MD, FACS Signed: 07/09/2016 2:52:21 PM By: Regan Lemming BSN, RN Entered By: Regan Lemming  on 07/09/2016 08:29:51 [name], [name] ([id number]) -------------------------------------------------------------------------------- Problem List Details [name], ROSINSKY. Date of Service: 07/09/2016 8:00 AM Medical Record Number: [id number] Patient Account Number: [id number] Date of Birth/Sex: June 20, 1956 (60 y.o. 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 Date of Service: 07/09/2016 8:00 AM Medical Record Number: [id number] Patient Account Number: [id number] Date of Birth/Sex: 1956/07/06 (60 y.o. Female) Treating RN: Afful, RN, BSN, Velva Harman Primary Care Physician: [name] Other Clinician: Referring Physician: Dossie [name] Treating Physician/Extender: Frann Rider in Treatment: 4 Subjective Chief Complaint Information obtained from Patient Patient presents to the wound care center for a consult due non healing wound the right lower extremity which she's had for about a month History of Present Illness (HPI) The following HPI elements were documented for the patient's wound: Location: right lower extremity Quality: Patient reports experiencing a sharp pain to affected area(s). Severity: Patient states wound are getting worse. Duration: Patient has had the wound for > 1 month prior to seeking treatment at the wound center Timing: Pain in wound is Intermittent (comes and goes Context: The wound occurred when the patient had a painful swelling which then started discharging pus and may have been an insect bite Modifying Factors: Other treatment(s) tried include:local antibiotic ointment and oral antibiotics Associated Signs and Symptoms: Patient reports having increase discharge. 60 year-old female who presented to her family medical doctor with a painful ulcer on the right leg which she has had for about 3 weeks. she was not sure whether she had a insect bite and was initially prescribed antihistamines. Down the line she was also started on doxycycline. She continues to drain pus at the present time. Her past medical history is significant for hypertension,  venous insufficiency, superficial thrombophlebitis, diverticulosis, osteoarthritis of both knees, status post gastric bypass, iron deficiency anemia,at his post laparoscopic subtotal colectomy for pancolonic diverticulosis, lumbar back pain. She had a venous duplex study done on 02/05/2016 which showed no DVT, SVT or incompetence bilaterally. 07/09/2016 -- continues to have a lot of pain in the right lateral lower leg where the ulcers are getting more superficial but larger. Arterial studies are still pending and at this stage I'm concerned about pyoderma gangrenosum and will get biopsies of the wound edge Objective Leyendecker, [name] ([id number]) Constitutional Pulse regular. Respirations normal and unlabored. Afebrile. Vitals Time Taken: 8:08 AM, Height: 64 in, Weight: 182 lbs, BMI: 31.2, Temperature: 98.2 F, Pulse: 77 bpm, Respiratory Rate: 17 breaths/min, Blood Pressure: 152/89 mmHg. Eyes Nonicteric. Reactive to light. Ears, Nose, Mouth, and Throat Lips, teeth, and gums WNL.Marland Kitchen Moist mucosa without lesions. Neck supple and nontender. No palpable supraclavicular or cervical adenopathy. Normal sized without goiter. Respiratory WNL. No retractions.. Cardiovascular Pedal Pulses WNL. No clubbing, cyanosis or edema. Lymphatic No adneopathy. No adenopathy. No adenopathy. Musculoskeletal Adexa without tenderness or enlargement.. Digits and nails w/o clubbing, cyanosis, infection, petechiae, ischemia, or inflammatory conditions.Marland Kitchen Psychiatric Judgement and insight Intact.. No evidence of depression,  anxiety, or agitation.. General Notes: the wounds continue to be shallow but are spreading a bit and there are edges which are everted and I'm concerned about pyoderma gangrenosum and will take wedge biopsies of the wound margins. Bleeding controlled with Silver nitrate. Integumentary (Hair, Skin) No suspicious lesions. No crepitus or fluctuance. No peri-wound warmth or erythema. No  masses.. Wound #1 status is Open. Original cause of wound was Gradually Appeared. The wound is located on the Right,Proximal,Lateral Lower Leg. The wound measures 1.8cm length x 1.4cm width x 0.1cm depth; 1.979cm^2 area and 0.198cm^3 volume. The wound is limited to skin breakdown. There is no tunneling or undermining noted. There is a large amount of serosanguineous drainage noted. The wound margin is distinct with the outline attached to the wound base. There is no granulation within the wound bed. There is a large (67-100%) amount of necrotic tissue within the wound bed including Adherent Slough. The periwound skin appearance exhibited: Localized Edema, Moist, Hemosiderin Staining. The periwound skin appearance did not exhibit: Callus, Crepitus, Excoriation, Fluctuance, Friable, Induration, Rash, Scarring, Dry/Scaly, Maceration, Atrophie Blanche, Cyanosis, Ecchymosis, Mottled, Pallor, Rubor, Erythema. Periwound temperature was noted as No Abnormality. The periwound has tenderness on palpation. [name], [name] ([id number]) Wound #2 status is Open. Original cause of wound was Gradually Appeared. The wound is located on the Right,Distal,Lateral Lower Leg. The wound measures 3.5cm length x 2cm width x 0.1cm depth; 5.498cm^2 area and 0.55cm^3 volume. The wound is limited to skin breakdown. There is no tunneling or undermining noted. There is a large amount of serosanguineous drainage noted. The wound margin is distinct with the outline attached to the wound base. There is no granulation within the wound bed. There is a large (67- 100%) amount of necrotic tissue within the wound bed including Adherent Slough. The periwound skin appearance exhibited: Localized Edema, Moist, Hemosiderin Staining. The periwound skin appearance did not exhibit: Callus, Crepitus, Excoriation, Fluctuance, Friable, Induration, Rash, Scarring, Dry/Scaly, Maceration, Atrophie Blanche, Cyanosis, Ecchymosis, Mottled, Pallor,  Rubor, Erythema. Periwound temperature was noted as No Abnormality. The periwound has tenderness on palpation. Assessment Active Problems ICD-10 S80.861A - Insect bite (nonvenomous), right lower leg, initial encounter L97.212 - Non-pressure chronic ulcer of right calf with fat layer exposed I89.0 - Lymphedema, not elsewhere classified at this stage I am concerned about pyoderma gangrenosum and we will get wedge biopsies from the wound margins Procedures Wound #1 Wound #1 is a Venous Leg Ulcer located on the Right, Proximal, Lateral Lower Leg . There was a biopsy performed by Christin Fudge, MD. There was a biopsy performed on Wound Margin. The skin was cleansed and prepped with anti-septic followed by pain control using Lidocaine Injectable: 2%. Tissue was removed at its base with the following instrument(s): Blade and Forceps and sent to pathology. A Moderate amount of bleeding was controlled with Silver Nitrate. A time out was conducted at 08:20, prior to the start of the procedure. The procedure was tolerated well with a pain level of 0 throughout and a pain level of 0 following the procedure. Post procedure Diagnosis Wound #1: Same as Pre-Procedure [name], [name] ([id number]) Plan Wound Cleansing: Wound #1 Right,Proximal,Lateral Lower Leg: Clean wound with Normal Saline. Wound #2 Right,Distal,Lateral Lower Leg: Clean wound with Normal Saline. Anesthetic: Wound #1 Right,Proximal,Lateral Lower Leg: Topical Lidocaine 4% cream applied to wound bed prior to debridement Wound #2 Right,Distal,Lateral Lower Leg: Topical Lidocaine 4% cream applied to wound bed prior to debridement Skin Barriers/Peri-Wound Care: Wound #1 Right,Proximal,Lateral Lower Leg: Skin Prep  Wound #2 Right,Distal,Lateral Lower Leg: Barrier cream Primary Wound Dressing: Wound #1 Right,Proximal,Lateral Lower Leg: Santyl Ointment Wound #2 Right,Distal,Lateral Lower Leg: Santyl Ointment Secondary  Dressing: Wound #1 Right,Proximal,Lateral Lower Leg: Dry Gauze Non-adherent pad Wound #2 Right,Distal,Lateral Lower Leg: Dry Gauze Non-adherent pad Dressing Change Frequency: Wound #1 Right,Proximal,Lateral Lower Leg: Change dressing every day. Wound #2 Right,Distal,Lateral Lower Leg: Change dressing every day. Follow-up Appointments: Wound #1 Right,Proximal,Lateral Lower Leg: Return Appointment in 1 week. Wound #2 Right,Distal,Lateral Lower Leg: Return Appointment in 1 week. Edema Control: Wound #1 Right,Proximal,Lateral Lower Leg: Elevate legs to the level of the heart and pump ankles as often as possible Wound #2 Right,Distal,Lateral Lower Leg: Patient to wear own compression stockings Elevate legs to the level of the heart and pump ankles as often as possible Additional Orders / Instructions: Wound #1 Right,Proximal,Lateral Lower Leg: Increase protein intake. Activity as tolerated Wound #2 Right,Distal,Lateral Lower Leg: [name], [name]. ([id number]) Increase protein intake. Activity as tolerated Laboratory ordered were: [facility] Bx report - Biopsy of right lateral lower leg After review have recommended: 1. Santyl ointment locally to be applied daily and a bordered foam over this. 2. she has got hold of compression stockings of the 20-30 mm variety and she is wearing these daily. 3. I had ordered arterial duplex studies both lower extremities -- appointment still pending 4. wedge biopsies taken today and await pathology report 5. elevation and exercise. 6. regular review at the wound center Electronic Signature(s) Signed: 07/09/2016 8:48:03 AM By: Christin Fudge MD, FACS Entered By: Christin Fudge on 07/09/2016 08:48:03 Gregory, [name] ([id number]) -------------------------------------------------------------------------------- SuperBill Details [name]. Date of Service: 07/09/2016 Medical Record Number: [id number] Patient Account Number:  [id number] Date of Birth/Sex: 04/04/1956 (60 y.o. Female) Treating RN: Afful, RN, BSN, Velva Harman Primary Care Physician: [name] Other Clinician: Referring Physician: Dossie [name] Treating Physician/Extender: Frann Rider in Treatment: 4 Diagnosis Coding ICD-10 Codes Code Description [phone] Insect bite (nonvenomous), right lower leg, initial encounter 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 layer exposed I89.0 Lymphedema, not elsewhere classified Facility Procedures CPT4 Code: [id number] Description: 11101 - BIOPSY SKIN GREATER THAN ONE ICD-10 Description Diagnosis S80.861A Insect bite (nonvenomous), right lower leg, initia 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

## 2016-07-11 ENCOUNTER — Emergency Department (HOSPITAL_COMMUNITY)
Admission: EM | Admit: 2016-07-11 | Discharge: 2016-07-11 | Disposition: A | Discharge status: Home / Self Care | Payer: Commercial Managed Care - HMO | Attending: Emergency Medicine | Admitting: Emergency Medicine

## 2016-07-11 ENCOUNTER — Emergency Department (HOSPITAL_BASED_OUTPATIENT_CLINIC_OR_DEPARTMENT_OTHER)
Admit: 2016-07-11 | Discharge: 2016-07-11 | Disposition: A | Discharge status: Home / Self Care | Payer: Commercial Managed Care - HMO

## 2016-07-11 ENCOUNTER — Encounter (HOSPITAL_COMMUNITY): Payer: Self-pay

## 2016-07-11 DIAGNOSIS — J45909 Unspecified asthma, uncomplicated: Secondary | ICD-10-CM | POA: Insufficient documentation

## 2016-07-11 DIAGNOSIS — Z5189 Encounter for other specified aftercare: Secondary | ICD-10-CM

## 2016-07-11 DIAGNOSIS — I10 Essential (primary) hypertension: Secondary | ICD-10-CM | POA: Diagnosis not present

## 2016-07-11 DIAGNOSIS — E119 Type 2 diabetes mellitus without complications: Secondary | ICD-10-CM | POA: Diagnosis not present

## 2016-07-11 DIAGNOSIS — M79604 Pain in right leg: Secondary | ICD-10-CM

## 2016-07-11 DIAGNOSIS — Z7982 Long term (current) use of aspirin: Secondary | ICD-10-CM | POA: Diagnosis not present

## 2016-07-11 DIAGNOSIS — Z4800 Encounter for change or removal of nonsurgical wound dressing: Secondary | ICD-10-CM | POA: Diagnosis not present

## 2016-07-11 DIAGNOSIS — M79661 Pain in right lower leg: Secondary | ICD-10-CM | POA: Diagnosis not present

## 2016-07-11 DIAGNOSIS — Z8673 Personal history of transient ischemic attack (TIA), and cerebral infarction without residual deficits: Secondary | ICD-10-CM | POA: Diagnosis not present

## 2016-07-11 DIAGNOSIS — M7989 Other specified soft tissue disorders: Secondary | ICD-10-CM

## 2016-07-11 DIAGNOSIS — Z96653 Presence of artificial knee joint, bilateral: Secondary | ICD-10-CM | POA: Insufficient documentation

## 2016-07-11 DIAGNOSIS — M79609 Pain in unspecified limb: Secondary | ICD-10-CM | POA: Diagnosis not present

## 2016-07-11 DIAGNOSIS — Z4801 Encounter for change or removal of surgical wound dressing: Secondary | ICD-10-CM | POA: Insufficient documentation

## 2016-07-11 LAB — CBC WITH DIFFERENTIAL/PLATELET
Basophils Absolute: 0 10*3/uL (ref 0.0–0.1)
Basophils Relative: 0 %
Eosinophils Absolute: 0.3 10*3/uL (ref 0.0–0.7)
Eosinophils Relative: 4 %
HCT: 31.8 % — ABNORMAL LOW (ref 36.0–46.0)
Hemoglobin: 9.6 g/dL — ABNORMAL LOW (ref 12.0–15.0)
Lymphocytes Relative: 36 %
Lymphs Abs: 2.2 10*3/uL (ref 0.7–4.0)
MCH: 26.4 pg (ref 26.0–34.0)
MCHC: 30.2 g/dL (ref 30.0–36.0)
MCV: 87.6 fL (ref 78.0–100.0)
Monocytes Absolute: 0.5 10*3/uL (ref 0.1–1.0)
Monocytes Relative: 8 %
Neutro Abs: 3.2 10*3/uL (ref 1.7–7.7)
Neutrophils Relative %: 52 %
Platelets: 257 10*3/uL (ref 150–400)
RBC: 3.63 MIL/uL — ABNORMAL LOW (ref 3.87–5.11)
RDW: 14.8 % (ref 11.5–15.5)
WBC: 6.2 10*3/uL (ref 4.0–10.5)

## 2016-07-11 LAB — COMPREHENSIVE METABOLIC PANEL
ALT: 20 U/L (ref 14–54)
AST: 25 U/L (ref 15–41)
Albumin: 3.5 g/dL (ref 3.5–5.0)
Alkaline Phosphatase: 66 U/L (ref 38–126)
Anion gap: 7 (ref 5–15)
BUN: 12 mg/dL (ref 6–20)
CO2: 23 mmol/L (ref 22–32)
Calcium: 8.9 mg/dL (ref 8.9–10.3)
Chloride: 109 mmol/L (ref 101–111)
Creatinine, Ser: 0.68 mg/dL (ref 0.44–1.00)
GFR calc Af Amer: >60 mL/min (ref >60)
GFR calc non Af Amer: >60 mL/min (ref >60)
Glucose, Bld: 67 mg/dL (ref 65–99)
Potassium: 3.3 mmol/L — ABNORMAL LOW (ref 3.5–5.1)
Sodium: 139 mmol/L (ref 135–145)
Total Bilirubin: 0.3 mg/dL (ref 0.3–1.2)
Total Protein: 6.6 g/dL (ref 6.5–8.1)

## 2016-07-11 MED ORDER — DICLOFENAC SODIUM 1 % TD GEL: 2.0000 g | Freq: Four times a day (QID) | TRANSDERMAL | 0 refills | Status: DC

## 2016-07-11 MED ORDER — OXYCODONE-ACETAMINOPHEN 5-325 MG PO TABS
1.0000 | ORAL_TABLET | Freq: Once | ORAL | Status: AC
  Administered 2016-07-11: 1 via ORAL
  Filled 2016-07-11: qty 1

## 2016-07-11 NOTE — ED Triage Notes (Signed)
Patient here with right lower leg pain from ongoing wound for greater than 1 month, seeing wound MD for same, stats she needs pain medicine for leg, no changes to wound, NAD

## 2016-07-11 NOTE — ED Provider Notes (Signed)
[facility] DEPT Provider Note   CSN: [id number] Arrival date & time: 07/11/16  1529  By signing my name below, I, Delton Prairie, attest that this documentation has been prepared under the direction and in the presence of  [employer], PA-C. Electronically Signed: Delton Prairie, ED Scribe. 07/11/16. 5:01 PM.   History   Chief Complaint Chief Complaint  Patient presents with  . Wound Check    The history is provided by the patient. No language interpreter was used.    HPI Comments:  [name] is a 60 y.o. female, with a hx of DM, who presents to the Emergency Department complaining of worsening, moderate pain to her right shin. She states the pain began 1 month ago s/p a possible insect bite for which she has been seeing a wound doctor. She states the pain worsened 2 days ago. Pt notes associated swelling onset 2 weeks ago for which she has been wearing her compression socks with no relief to the R leg. Pt notes the pain is exacerbated to the touch. Pt states she visited her PCP and was given Tramadol which has given her little relief. She denies fevers, chest pain, SOB, nausea or vomiting. Pt states she has a [place] scheduled for 07/13/16.   Past Medical History:  Diagnosis Date  . Anemia   . Arthritis    knees  . Back pain 10/04/2012  . Blood in stool 10/07/2014  . Blood transfusion without reported diagnosis   . Bunion 03/28/2014  . 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 Degenerative arthritis of right shoulder region 08/2013  . Diverticulosis   . GERD (gastroesophageal reflux disease)   . High cholesterol   . History of GI bleed   . Hypertension    under control with meds., has been on med. > 20 yr.  . Lower GI bleed   . Medial meniscus tear 1997   Right Knee  . Mini stroke ([place])   . Rotator cuff rupture, complete 08/2013   right  . Shoulder impingement 08/2013   right  . Stroke [facility]) 2006   Remote left lacunar  infarct noted on CT head 2006, 2010   . Wears dentures    upper  . Wears partial dentures    lower    Patient Active Problem List   Diagnosis Date Noted  . Skin lesion of right lower extremity 05/27/2016  . DDD (degenerative disc disease), lumbar 04/14/2016  . Cervical pain (neck) 03/26/2016  . Lumbar back pain 03/26/2016  . Osteoarthritis of both knees 03/12/2016  . Cramping of hands 02/27/2016  . Hypoalbuminemia 01/30/2016  . Superficial thrombophlebitis 01/29/2016  . Leg swelling 01/29/2016  . Vision changes 12/30/2015  . S/P laparoscopic colectomy 12/25/2015  . Premature supraventricular beats 12/25/2015  . Tension type headache 05/26/2015  . Muscle pain 05/26/2015  . Chest pain 05/12/2015  . Diverticulosis of colon without hemorrhage 11/18/2014  . Pain in joint, shoulder region 07/25/2014  . Muscle cramps 04/22/2014  . Biliary sludge 04/08/2014  . Constipation 10/19/2013  . Subacromial bursitis 05/21/2013  . Eczema 05/21/2013  . External hemorrhoids without complication [id number]  . Insomnia 08/29/2012  . GERD (gastroesophageal reflux disease) 08/24/2012  . S/P gastric bypass 06/07/2012  . Asthma, cough variant 03/10/2012  . Diverticulosis of colon with hemorrhage   . Hypopigmented skin lesion 02/23/2011  . Allergic rhinitis 05/27/2010  . POSTMENOPAUSAL STATUS 04/07/2010  . LIBIDO, DECREASED 10/16/2009  .  H/O type 2 diabetes mellitus 01/25/2008  . CYST, KIDNEY, ACQUIRED 08/15/2007  . CEREBROVASCULAR ACCIDENT, HX OF 07/26/2007  . HTN, goal below 140/90 05/12/2007  . OBESITY, NOS 12/29/2006  . Iron deficiency anemia 12/29/2006  . Venous (peripheral) insufficiency 12/29/2006    Past Surgical History:  Procedure Laterality Date  . ABDOMINAL HYSTERECTOMY  ?1987   partial  . COLONOSCOPY  05/02/2000; 05/08/2013  . COSMETIC SURGERY  02/22/2014   skin removal surgery from lower abdomen   . METATARSAL OSTEOTOMY Right 1995   Right Bunion Repair  . ROUX-EN-Y GASTRIC  BYPASS  05/07/2012  . SHOULDER ARTHROSCOPY WITH ROTATOR CUFF REPAIR AND SUBACROMIAL DECOMPRESSION Right 08/09/2013   Procedure: RIGHT SHOULDER ARTHROSCOPY WITH SUBACROMIAL DECOMPRESSION, DISTAL CLAVICLE EXCISION AND ROTATOR CUFF REPAIR and release biceps;  Surgeon: Ninetta Lights, MD;  Location: [facility];  Service: Orthopedics;  Laterality: Right;  . SUBTOTAL COLECTOMY  11/24/15   [place] Left   . TOTAL KNEE ARTHROPLASTY Right     OB History    No data available       Home Medications    Prior to Admission medications   Medication Sig Start Date End Date Taking? Authorizing Provider  aspirin 81 MG tablet Take 81 mg by mouth daily.    Historical Provider, MD  atorvastatin (LIPITOR) 40 MG tablet TAKE ONE TABLET EACH DAY 02/27/16   [name], MD  calcium carbonate (OS-CAL) 600 MG TABS tablet Take 1 tablet (600 mg total) by mouth 2 (two) times daily with a meal. 01/07/14   Boykin Nearing, MD  cetirizine (ZYRTEC) 10 MG tablet Take 1 tablet (10 mg total) by mouth daily. 05/19/16   Mercy Riding, MD  Cholecalciferol (D3 MAXIMUM STRENGTH) 5000 units capsule Take 5,000 Units by mouth daily.    Historical Provider, MD  cyclobenzaprine (FLEXERIL) 10 MG tablet Take 1 tablet (10 mg total) by mouth 3 (three) times daily as needed for muscle spasms. Patient not taking: Reported on 05/11/2016 03/06/16   Davonna Belling, MD  docusate sodium (DOK) 100 MG capsule TAKE ONE CAPSULE TWICE A DAY AS NEEDED FOR CONSTIPATION 02/27/16   [name], MD  doxycycline (VIBRAMYCIN) 100 MG capsule Take 1 capsule (100 mg total) by mouth 2 (two) times daily. 05/25/16   Mercy Riding, MD  esomeprazole (NEXIUM) 40 MG capsule Take 1 tab every morning. 07/29/15   Amy S Esterwood, PA-C  ferrous sulfate 220 (44 FE) MG/5ML solution Take 5 mLs (220 mg total) by mouth 2 (two) times daily with a meal. 07/17/15   [name], MD  fluticasone (FLONASE) 50 MCG/ACT nasal spray Place 2  sprays into both nostrils at bedtime. 03/02/16   [name], MD  furosemide (LASIX) 20 MG tablet TAKE TWO TABLETS EVERY DAY 05/24/16   [name], MD  lisinopril (PRINIVIL,ZESTRIL) 20 MG tablet Take 1 tablet (20 mg total) by mouth daily. 02/27/16   [name], MD  meloxicam (MOBIC) 15 MG tablet Take 15 mg by mouth daily. 04/23/16   Historical Provider, MD  Multiple Vitamin (MULTIVITAMIN) tablet Take 1 tablet by mouth daily. 01/07/14   Josalyn Funches, MD  polyethylene glycol powder (GLYCOLAX/MIRALAX) powder TAKE 17G BY MOUTH TWICE DAILY 11/19/15   [name], MD  sucralfate (CARAFATE) 1 GM/10ML suspension Take 10 mLs (1 g total) by mouth 4 (four) times daily -  with meals and at bedtime. 05/27/16   Eloise Levels, MD  traMADol (ULTRAM) 50 MG tablet Take 1 tablet (50 mg total) by mouth every 8 (eight) hours as needed for moderate pain or severe pain. 07/07/16   [name], MD  vitamin B-12 (CYANOCOBALAMIN) 1000 MCG tablet Take 1,000 mcg by mouth daily.    Historical Provider, MD  vitamin C (ASCORBIC ACID) 250 MG tablet Take 250 mg by mouth daily.    Historical Provider, MD  vitamin E 400 UNIT capsule Take 400 Units by mouth daily.    Historical Provider, MD  Vitamins A & D 5000-400 units CAPS Take 5,000 Units by mouth daily. 03/16/16   Historical Provider, MD  zolpidem (AMBIEN) 10 MG tablet TAKE ONE TABLET AT BEDTIME AS NEEDED FOR SLEEP 05/26/16   [name], MD    Family History Family History  Problem Relation Age of Onset  . Cancer Father 43    Died from complications of colon CA  . Colon cancer Father     died in his 32s  . Diabetes Mother   . Hypertension Mother   . Diabetes Sister   . Heart disease Sister   . Diabetes Sister   . Stroke Sister   . Diabetes Brother   . Stroke Brother   . Diabetes Brother   . Diabetes Brother   . Diabetes Brother     Social History Social History  Substance Use Topics  . Smoking status: Never Smoker  . Smokeless tobacco: Never Used  .  Alcohol use 0.0 oz/week     Comment: occasional wine     Allergies   Aspirin; Nsaids; and Tolmetin   Review of Systems Review of Systems 10 systems reviewed and all are negative for acute change except as noted in the HPI.    Physical Exam Updated Vital Signs BP 137/88 (BP Location: Left Arm)   Pulse 88   Temp 98.5 F (36.9 C) (Oral)   Resp 18   SpO2 100%   Physical Exam  Constitutional: She appears well-developed and well-nourished. No distress.  HENT:  Head: Normocephalic and atraumatic.  Neck: Neck supple.  Pulmonary/Chest: Effort normal.  Musculoskeletal: She exhibits edema.  3+ pitting edema to R shin  Neurological: She is alert.  Skin: She is not diaphoretic.     Nursing note and vitals reviewed.    ED Treatments / Results  DIAGNOSTIC STUDIES:  Oxygen Saturation is 100% on RA, normal by my interpretation.    COORDINATION OF CARE:  4:37 PM Discussed treatment plan with pt at bedside and pt agreed to plan.  Labs (all labs ordered are listed, but only abnormal results are displayed) Labs Reviewed - No data to display  EKG  EKG Interpretation None       Radiology No results found.  Procedures Procedures (including critical care time)  Medications Ordered in ED Medications - No data to display   Initial Impression / Assessment and Plan / ED Course  I have reviewed the triage vital signs and the nursing notes.  Pertinent labs & imaging results that were available during my care of the patient were reviewed by me and considered in my medical decision making (see chart for details).  Clinical Course    Vitals:   07/11/16 1543 07/11/16 1849  BP: 137/88 151/89  Pulse: 88 73  Resp: 18 16  Temp: 98.5 F (36.9 C) 98.4 F (36.9 C)  TempSrc: Oral Oral  SpO2: 100% 100%    Medications  oxyCODONE-acetaminophen (PERCOCET/ROXICET) 5-325 MG per tablet 1 tablet (1 tablet  Oral Given 07/11/16 1704)    [name] is 60 y.o. female  presenting with Pain and swelling to the right lower extremity, she's being followed by wound care for poorly healing ulcer. No signs of systemic infection, her pain is severe and not alleviated with tramadol, patient is given Percocet in the ED, thelevelofherswellingMconcernedaboutaDVT,shehasanoutpatientDVTpendingwithnosignsof  Based on the level of her swelling I am concerned about a DVT. She has an outpatient DVT study pending in several days however I would like to get the study today.  Verbal report from vascular technician is negative. Patient will be given prescription for Voltaren gel. She is advised to follow with primary care for pain management.  Evaluation does not show pathology that would require ongoing emergent intervention or inpatient treatment. Pt is hemodynamically stable and mentating appropriately. Discussed findings and plan with patient/guardian, who agrees with care plan. All questions answered. Return precautions discussed and outpatient follow up given.     Final Clinical Impressions(s) / ED Diagnoses   Final diagnoses:  Visit for wound check    New Prescriptions Discharge Medication List as of 07/11/2016  6:31 PM    START taking these medications   Details  diclofenac sodium (VOLTAREN) 1 % GEL Apply 2 g topically 4 (four) times daily., Starting Sun 07/11/2016, Print       I personally performed the services described in this documentation, which was scribed in my presence. The recorded information has been reviewed and is accurate.     Monico Blitz, PA-C 07/11/16 [place], MD 07/11/16 [phone]

## 2016-07-11 NOTE — Discharge Instructions (Signed)
Please follow with your primary care doctor in the next 2 days for a check-up. They must obtain records for further management.  ° °Do not hesitate to return to the Emergency Department for any new, worsening or concerning symptoms.  ° °

## 2016-07-11 NOTE — Progress Notes (Signed)
VASCULAR LAB PRELIMINARY  PRELIMINARY  PRELIMINARY  PRELIMINARY  Right lower extremity venous duplex completed.    Preliminary report:  There is no DVT or SVT noted in the right lower extremity.   Leahann Lempke, RVT 07/11/2016, 6:06 PM

## 2016-07-12 ENCOUNTER — Telehealth: Payer: Self-pay | Admitting: Family Medicine

## 2016-07-12 NOTE — Telephone Encounter (Signed)
Pt called and would like to speak to Dr. Ree Kida or one of his nurses. She went to the ER last night and she also is having some issues still. Please call . jw

## 2016-07-13 ENCOUNTER — Ambulatory Visit (HOSPITAL_COMMUNITY)
Admission: RE | Admit: 2016-07-13 | Discharge: 2016-07-13 | Disposition: A | Discharge status: Home / Self Care | Payer: Commercial Managed Care - HMO | Source: Ambulatory Visit | Attending: Vascular Surgery | Admitting: Vascular Surgery

## 2016-07-13 ENCOUNTER — Encounter: Payer: Self-pay | Admitting: Vascular Surgery

## 2016-07-13 DIAGNOSIS — E119 Type 2 diabetes mellitus without complications: Secondary | ICD-10-CM | POA: Diagnosis not present

## 2016-07-13 DIAGNOSIS — R0989 Other specified symptoms and signs involving the circulatory and respiratory systems: Secondary | ICD-10-CM | POA: Diagnosis present

## 2016-07-13 DIAGNOSIS — I10 Essential (primary) hypertension: Secondary | ICD-10-CM | POA: Insufficient documentation

## 2016-07-13 DIAGNOSIS — L97212 Non-pressure chronic ulcer of right calf with fat layer exposed: Secondary | ICD-10-CM | POA: Diagnosis not present

## 2016-07-13 LAB — VAS US LOWER EXTREMITY ARTERIAL DUPLEX
RIGHT ANT DIST TIBAL SYS PSV: 52 cm/s
RIGHT POST TIB DIST SYS: -77 cm/s
Right peroneal sys PSV: -56 cm/s
Right popliteal dist sys PSV: -60 cm/s
Right popliteal prox sys PSV: 97 cm/s
Right super femoral dist sys PSV: -72 cm/s
Right super femoral mid sys PSV: -104 cm/s
Right super femoral prox sys PSV: -137 cm/s

## 2016-07-13 NOTE — Telephone Encounter (Signed)
Patient has appointment this Friday with PCP.

## 2016-07-13 NOTE — Progress Notes (Deleted)
   Subjective:    Patient ID: [name], female    DOB: October 15, 1956, 60 y.o.   MRN: [id number]  HPI 60 y/o female presents for ED follow up.  Reviewed ED note from 9/10. Presented for worsening pain and swelling in RLE. Had [place] that was negative for DVT. Given prescription for percocet.  Seen by vascular surgery on 9/13 - ordered venous reflux studies, recommended continued compression/elevation/abx.  Seen by wound clinic on 9/8. Reviewed note. 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 [facility] - initially seen on 7/19, wound thought to be due to insect bite therefore started on Zrytec, did not improve and started in trial of Doxycycline, seen again 7/27 and referred to wound clinic as wound was not healing.    Review of Systems     Objective:   Physical Exam There were no vitals taken for this visit.    9/10 LE Doppler negative for DVT Arterial studies/ABI pending    Assessment & Plan:  No problem-specific Assessment & Plan notes found for this encounter.

## 2016-07-13 NOTE — Telephone Encounter (Signed)
LMOVM for pt to call us back. Please advise. Deseree Kennon Holter, CMA

## 2016-07-13 NOTE — Telephone Encounter (Signed)
I have appointment times available on Friday. Please schedule her.

## 2016-07-14 ENCOUNTER — Encounter: Payer: Self-pay | Admitting: Vascular Surgery

## 2016-07-14 ENCOUNTER — Ambulatory Visit (INDEPENDENT_AMBULATORY_CARE_PROVIDER_SITE_OTHER): Payer: Commercial Managed Care - HMO | Admitting: Vascular Surgery

## 2016-07-14 VITALS — BP 147/96 | HR 70 | Temp 97.2°F | Ht 64.0 in | Wt 184.0 lb

## 2016-07-14 DIAGNOSIS — I83013 Varicose veins of right lower extremity with ulcer of ankle: Secondary | ICD-10-CM | POA: Diagnosis not present

## 2016-07-14 DIAGNOSIS — L97319 Non-pressure chronic ulcer of right ankle with unspecified severity: Principal | ICD-10-CM

## 2016-07-14 LAB — SURGICAL PATHOLOGY

## 2016-07-14 NOTE — Progress Notes (Addendum)
[name] MRN: [id number] DOB: 08-Jul-1956 Sex: female  REASON FOR CONSULT: Evaluation for venous stasis ulceration right leg  HPI: [name] is a 60 y.o. female, here today for evaluation of venous stasis ulceration. She reports is been present for proximally 6 weeks. She denies any trauma to this area. Reported that she spontaneously had ulceration over the lateral aspect of her right ankle. She has severe pain associated with this. She does have chronic swelling associated with her right leg as well. She has been treated appropriately at a wound center with topical treatment to the open ulcer and compression. She's had very slow progress regarding healing of this and continues to have severe pain. She is taking tramadol for pain and reports this is giving her minimal relief.  Past Medical History:  Diagnosis Date  . Anemia   . Arthritis    knees  . Back pain 10/04/2012  . Blood in stool 10/07/2014  . Blood transfusion without reported diagnosis   . Bunion 03/28/2014  . 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 Degenerative arthritis of right shoulder region 08/2013  . Diverticulosis   . GERD (gastroesophageal reflux disease)   . High cholesterol   . History of GI bleed   . Hypertension    under control with meds., has been on med. > 20 yr.  . Lower GI bleed   . Medial meniscus tear 1997   Right Knee  . Mini stroke ([place])   . Rotator cuff rupture, complete 08/2013   right  . Shoulder impingement 08/2013   right  . Stroke [facility]) 2006   Remote left lacunar infarct noted on CT head 2006, 2010   . Wears dentures    upper  . Wears partial dentures    lower    Family History  Problem Relation Age of Onset  . Cancer Father 32    Died from complications of colon CA  . Colon cancer Father     died in his 87s  . Diabetes Mother   . Hypertension Mother   . Diabetes Sister   . Heart disease Sister   .  Diabetes Sister   . Stroke Sister   . Diabetes Brother   . Stroke Brother   . Diabetes Brother   . Diabetes Brother   . Diabetes Brother     SOCIAL HISTORY: Social History   Social History  . Marital status: Divorced    Spouse name: N/A  . Number of children: 1  . 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 Smoking status: Never Smoker  . Smokeless tobacco: Never Used  . Alcohol use 0.0 oz/week     Comment: occasional wine  . Drug use: No  . Sexual activity: Not on file   Other Topics Concern  . Not on file   Social History Narrative  . No narrative on file    Allergies  Allergen Reactions  . Aspirin Other (See Comments)    GI BLEED  . Nsaids Other (See Comments)    GI BLEED  . Tolmetin Other (See Comments)    GI BLEED    Current Outpatient Prescriptions  Medication Sig Dispense Refill  . aspirin 81 MG tablet Take 81 mg by mouth daily.    Marland Kitchen atorvastatin (LIPITOR) 40 MG tablet TAKE ONE TABLET EACH  DAY 90 tablet 1  . calcium carbonate (OS-CAL) 600 MG TABS tablet Take 1 tablet (600 mg total) by mouth 2 (two) times daily with a meal. 180 tablet 1  . cetirizine (ZYRTEC) 10 MG tablet Take 1 tablet (10 mg total) by mouth daily. 30 tablet 11  . Cholecalciferol (D3 MAXIMUM STRENGTH) 5000 units capsule Take 5,000 Units by mouth daily.    . diclofenac sodium (VOLTAREN) 1 % GEL Apply 2 g topically 4 (four) times daily. 100 g 0  . docusate sodium (DOK) 100 MG capsule TAKE ONE CAPSULE TWICE A DAY AS NEEDED FOR CONSTIPATION 180 capsule 1  . doxycycline (VIBRAMYCIN) 100 MG capsule Take 1 capsule (100 mg total) by mouth 2 (two) times daily. 14 capsule 0  . esomeprazole (NEXIUM) 40 MG capsule Take 1 tab every morning. 30 capsule 11  . ferrous sulfate 220 (44 FE) MG/5ML solution Take 5 mLs (220 mg total) by mouth 2 (two) times daily with a meal. 473 mL 2  . fluticasone (FLONASE) 50 MCG/ACT nasal spray Place 2 sprays into both  nostrils at bedtime. 16 g 6  . furosemide (LASIX) 20 MG tablet TAKE TWO TABLETS EVERY DAY 60 tablet 2  . lisinopril (PRINIVIL,ZESTRIL) 20 MG tablet Take 1 tablet (20 mg total) by mouth daily. 90 tablet 1  . meloxicam (MOBIC) 15 MG tablet Take 15 mg by mouth daily.    . Multiple Vitamin (MULTIVITAMIN) tablet Take 1 tablet by mouth daily. 90 tablet 1  . polyethylene glycol powder (GLYCOLAX/MIRALAX) powder TAKE 17G BY MOUTH TWICE DAILY 850 g 2  . sucralfate (CARAFATE) 1 GM/10ML suspension Take 10 mLs (1 g total) by mouth 4 (four) times daily -  with meals and at bedtime. 420 mL 1  . traMADol (ULTRAM) 50 MG tablet Take 1 tablet (50 mg total) by mouth every 8 (eight) hours as needed for moderate pain or severe pain. 60 tablet 1  . vitamin B-12 (CYANOCOBALAMIN) 1000 MCG tablet Take 1,000 mcg by mouth daily.    . vitamin C (ASCORBIC ACID) 250 MG tablet Take 250 mg by mouth daily.    . vitamin E 400 UNIT capsule Take 400 Units by mouth daily.    . Vitamins A & D 5000-400 units CAPS Take 5,000 Units by mouth daily.    Marland Kitchen zolpidem (AMBIEN) 10 MG tablet TAKE ONE TABLET AT BEDTIME AS NEEDED FOR SLEEP 30 tablet 2  . cyclobenzaprine (FLEXERIL) 10 MG tablet Take 1 tablet (10 mg total) by mouth 3 (three) times daily as needed for muscle spasms. (Patient not taking: Reported on 07/14/2016) 8 tablet 0   No current facility-administered medications for this visit.     REVIEW OF SYSTEMS:  [X]  denotes positive finding, [ ]  denotes negative finding Cardiac  Comments:  Chest pain or chest pressure:     Shortness of breath upon exertion:    Short of breath when lying flat:    Irregular heart rhythm:        Vascular    Pain in calf, thigh, or hip brought on by ambulation: x   Pain in feet at night that wakes you up from your sleep:  x   Blood clot in your veins:    Leg swelling:  x       Pulmonary    Oxygen at home:    Productive cough:     Wheezing:         Neurologic    Sudden weakness in arms or legs:  Sudden numbness in arms or legs:     Sudden onset of difficulty speaking or slurred speech:    Temporary loss of vision in one eye:     Problems with dizziness:         Gastrointestinal    Blood in stool:     Vomited blood:         Genitourinary    Burning when urinating:  x   Blood in urine:        Psychiatric    Major depression:         Hematologic    Bleeding problems:    Problems with blood clotting too easily:        Skin    Rashes or ulcers: x       Constitutional    Fever or chills:      PHYSICAL EXAM: Vitals:   07/14/16 1553 07/14/16 1557  BP: (!) 147/94 (!) 147/96  Pulse: 70   Temp: 97.2 F (36.2 C)   TempSrc: Oral   SpO2: 100%   Weight: 184 lb (83.5 kg)   Height: 5\' 4"  (1.626 m)     GENERAL: The patient is a well-nourished female, in no acute distress. The vital signs are documented above. CARDIAC: There is a regular rate and rhythm.  VASCULAR: 2+ radial and 2+ dorsalis pedis pulses bilaterally PULMONARY: There is good air exchange bilaterally without wheezing or rales. ABDOMEN: Soft and non-tender with normal pitched bowel sounds.  MUSCULOSKELETAL: There are no major deformities or cyanosis. NEUROLOGIC: No focal weakness or paresthesias are detected. SKIN she does have well-healed scarred from bilateral knee replacements on her anterior knees. Her right leg is noted for swelling or particular from her calf distally. She has open ulcerations over the entire left portion of her lateral calf. PSYCHIATRIC: The patient has a normal affect.  DATA:   She did go undergo venous duplex to rule out DVT at hospital. This showed no DVT but was not a reflux study. She underwent arterial studies in our office yesterday and these were totally normal    MEDICAL ISSUES:  Image her saphenous vein with SonoSite ultrasound and this does show enlargement and reflux throughout its course. Was unable to image her deep system. I discussed this at length with the  patient. She clearly has severe venous hypertension with at least major component of this being related to her saphenous vein reflux. I have recommended formal venous reflux study to determine if she could be treated with laser ablation to speed her healing and also reduce her risk for recurrence. We will obtain this at her earliest convenience. I did explain that her current treatment with elevation compression and topical treatment at the wound center was certainly appropriate and would continue this until the wound is healed. We will discuss this further with her after additional reflux study   Early, [facility] [phone]  [name] underwent right leg venous duplex on 07/15/2016. This did show reflux in her great saphenous vein with dilatation of her saphenous vein throughout its course. She did have some reflux in her femoral and popliteal veins. She does have a very severe venous ulcer very poor response to aggressive local care. I feel that she would benefit from ablation of her right great saphenous vein for relief of superficial component of her venous hypertension and assistance in healing her severe venous ulcer.

## 2016-07-15 ENCOUNTER — Ambulatory Visit (HOSPITAL_COMMUNITY)
Admission: RE | Admit: 2016-07-15 | Discharge: 2016-07-15 | Disposition: A | Discharge status: Home / Self Care | Payer: Commercial Managed Care - HMO | Source: Ambulatory Visit | Attending: Vascular Surgery | Admitting: Vascular Surgery

## 2016-07-15 ENCOUNTER — Encounter: Payer: Commercial Managed Care - HMO | Admitting: Surgery

## 2016-07-15 DIAGNOSIS — I10 Essential (primary) hypertension: Secondary | ICD-10-CM | POA: Diagnosis not present

## 2016-07-15 DIAGNOSIS — K5790 Diverticulosis of intestine, part unspecified, without perforation or abscess without bleeding: Secondary | ICD-10-CM | POA: Diagnosis not present

## 2016-07-15 DIAGNOSIS — I83013 Varicose veins of right lower extremity with ulcer of ankle: Secondary | ICD-10-CM | POA: Diagnosis not present

## 2016-07-15 DIAGNOSIS — S80861A Insect bite (nonvenomous), right lower leg, initial encounter: Secondary | ICD-10-CM | POA: Diagnosis not present

## 2016-07-15 DIAGNOSIS — L97212 Non-pressure chronic ulcer of right calf with fat layer exposed: Secondary | ICD-10-CM | POA: Diagnosis not present

## 2016-07-15 DIAGNOSIS — Z9884 Bariatric surgery status: Secondary | ICD-10-CM | POA: Diagnosis not present

## 2016-07-15 DIAGNOSIS — D509 Iron deficiency anemia, unspecified: Secondary | ICD-10-CM | POA: Diagnosis not present

## 2016-07-15 DIAGNOSIS — M170 Bilateral primary osteoarthritis of knee: Secondary | ICD-10-CM | POA: Diagnosis not present

## 2016-07-15 DIAGNOSIS — I890 Lymphedema, not elsewhere classified: Secondary | ICD-10-CM | POA: Diagnosis not present

## 2016-07-15 DIAGNOSIS — L97319 Non-pressure chronic ulcer of right ankle with unspecified severity: Secondary | ICD-10-CM

## 2016-07-15 NOTE — Progress Notes (Addendum)
[name], [name] ([id number]) Visit Report for 07/15/2016 Chief Complaint Document Details [name], [name]. Date of Service: 07/15/2016 9:15 AM Medical Record Number: [id number] Patient Account Number: [id number] Date of Birth/Sex: 14-Oct-1956 (60 y.o. Female) Treating RN: Montey Hora Primary Care Physician: Dossie Arbour Other Clinician: Referring Physician: Dossie Arbour Treating Physician/Extender: Frann Rider in Treatment: 5 Information Obtained from: Patient Chief Complaint Patient presents to the wound care center for a consult due non healing wound the right lower extremity which she's had for about a month Electronic Signature(s) Signed: 07/15/2016 9:49:48 AM By: Christin Fudge MD, FACS Entered By: Christin Fudge on 07/15/2016 09:49:48 [name], [name] ([id number]) -------------------------------------------------------------------------------- HPI Details [name]. Date of Service: 07/15/2016 9:15 AM Medical Record Number: [id number] Patient Account Number: [id number] Date of Birth/Sex: [birth year] (60 y.o. Female) Treating RN: Montey Hora Primary Care Physician: Dossie Arbour Other Clinician: Referring Physician: Dossie Arbour Treating Physician/Extender: Frann Rider in Treatment: 5 History of Present Illness Location: right lower extremity Quality: Patient reports experiencing a sharp pain to affected area(s). Severity: Patient states wound are getting worse. Duration: Patient has had the wound for > 1 month prior to seeking treatment at the wound center Timing: Pain in wound is Intermittent (comes and goes Context: The wound occurred when the patient had a painful swelling which then started discharging pus and may have been an insect bite Modifying Factors: Other treatment(s) tried include:local antibiotic ointment and oral antibiotics Associated Signs and Symptoms: Patient reports having increase discharge. HPI Description:  60 year-old female who presented to her family medical doctor with a painful ulcer on the right leg which she has had for about 3 weeks. she was not sure whether she had a insect bite and was initially prescribed antihistamines. Down the line she was also started on doxycycline. She continues to drain pus at the present time. Her past medical history is significant for hypertension, venous insufficiency, superficial thrombophlebitis, diverticulosis, osteoarthritis of both knees, status post gastric bypass, iron deficiency anemia,at his post laparoscopic subtotal colectomy for pancolonic diverticulosis, lumbar back pain. She had a venous duplex study done on 02/05/2016 which showed no DVT, SVT or incompetence bilaterally. 07/09/2016 -- continues to have a lot of pain in the right lateral lower leg where the ulcers are getting more superficial but larger. Arterial studies are still pending and at this stage I'm concerned about pyoderma gangrenosum and will get biopsies of the wound edge. 07/15/2016 -- -- surgical pathology report -- DIAGNOSIS: A. SKIN, RIGHT LOWER LEG, BIOPSY: - ULCER, SEE COMMENT. 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 - NEGATIVE FOR MALIGNANCY. Comment: The biopsies are from the edge of an ulcer. The dermis contains clustered capillary proliferations, hemosiderin-laden macrophages, and fibrosis. Infiltrates of plasma cells, lymphocytes, and eosinophils are present, but neutrophils are sparse. There are focal areas of subepidermal edema, hemorrhage, and necrosis, but no definite vasculitis is observed. Granulomatous inflammation is absent. There is no evidence of a neoplasm. The histologic features are not specific, but suggest a component of venous stasis. Characteristic changes of pyoderma gangrenosum, a clinical diagnosis of exclusion, are not identified. Clinical correlation is recommended. She was seen by Dr. Sherren Mocha early on 07/14/16 - did that she had a  venous duplex study in the past but did not have a reflux study. She underwent a arterial study at his office the day before and these were totally Sias, [name]. ([id number]) normal. He did a ultrasound in the office which showed enlargement  and reflux throughout its course of the saphenous vein. Venous system could not be seen. His impression was she clearly has severe venous hypertension with at least a major component of this being related to a saphenous vein reflux. Electronic Signature(s) Signed: 07/15/2016 9:51:52 AM By: Christin Fudge MD, FACS Entered By: Christin Fudge on 07/15/2016 09:51:50 [name], [name] ([id number]) -------------------------------------------------------------------------------- Physical Exam Details [name]. Date of Service: 07/15/2016 9:15 AM Medical Record Number: [id number] Patient Account Number: [id number] Date of Birth/Sex: 11/26/1955 (60 y.o. Female) Treating RN: Montey Hora Primary Care Physician: Dossie Arbour Other Clinician: Referring Physician: Dossie Arbour Treating Physician/Extender: Frann Rider in Treatment: 5 Constitutional . Pulse regular. Respirations normal and unlabored. Afebrile. . Eyes Nonicteric. Reactive to light. Ears, Nose, Mouth, and Throat Lips, teeth, and gums WNL.Marland Kitchen Moist mucosa without lesions. Neck supple and nontender. No palpable supraclavicular or cervical adenopathy. Normal sized without goiter. Respiratory WNL. No retractions.. Cardiovascular Pedal Pulses WNL. No clubbing, cyanosis or edema. Lymphatic No adneopathy. No adenopathy. No adenopathy. Musculoskeletal Adexa without tenderness or enlargement.. Digits and nails w/o clubbing, cyanosis, infection, petechiae, ischemia, or inflammatory conditions.. Integumentary (Hair, Skin) No suspicious lesions. No crepitus or fluctuance. No peri-wound warmth or erythema. No masses.Marland Kitchen Psychiatric Judgement and insight Intact.. No evidence of  depression, anxiety, or agitation.. Notes the ulceration on the lateral part of the leg are shallow and the base is consistent with some slough but there is not much weeping. no Sharp debridement was done today. Electronic Signature(s) Signed: 07/15/2016 10:02:05 AM By: Christin Fudge MD, FACS Entered By: Christin Fudge on 07/15/2016 10:02:05 [name], [name] ([id number]) -------------------------------------------------------------------------------- Physician Orders Details [name]. Date of Service: 07/15/2016 9:15 AM Medical Record Number: [id number] Patient Account Number: [id number] Date of Birth/Sex: 1956-02-19 (60 y.o. 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 Wound #2 Right,Distal,Lateral Lower Leg o Clean wound with Normal Saline. Anesthetic Wound #1 Right,Proximal,Lateral Lower Leg o Topical Lidocaine 4% cream applied to wound bed prior to debridement Wound #2 Right,Distal,Lateral Lower Leg o Topical Lidocaine 4% cream applied to wound bed prior to debridement Skin Barriers/Peri-Wound Care Wound #1 Right,Proximal,Lateral Lower Leg o Barrier cream Wound #2 Right,Distal,Lateral Lower Leg o Barrier cream Primary  Wound Dressing Wound #1 Right,Proximal,Lateral Lower Leg o Aquacel Ag Wound #2 Right,Distal,Lateral Lower Leg [name], [name] ([id number]) o Aquacel Ag Secondary Dressing Wound #1 Right,Proximal,Lateral Lower Leg o ABD pad Wound #2 Right,Distal,Lateral Lower Leg o ABD pad Dressing Change Frequency Wound #1 Right,Proximal,Lateral Lower Leg o Change dressing every week Wound #2 Right,Distal,Lateral Lower Leg o Change dressing every week Follow-up Appointments Wound #1 Right,Proximal,Lateral Lower Leg o Return Appointment in 1 week. Wound #2 Right,Distal,Lateral Lower Leg o Return Appointment in 1 week. Edema Control Wound #1 Right,Proximal,Lateral Lower Leg o 3 Layer Compression System - Right Lower Extremity o Elevate legs to the level of the heart and pump ankles as  often as possible Wound #2 Right,Distal,Lateral Lower Leg o 3 Layer Compression System - Right Lower Extremity o Patient to wear own compression stockings o Elevate legs to the level of the heart and pump ankles as often as possible Additional Orders / Instructions Wound #1 Right,Proximal,Lateral Lower Leg o Increase protein intake. o Activity as tolerated Wound #2 Right,Distal,Lateral Lower Leg o Increase protein intake. o Activity as tolerated Electronic Signature(s) Signed: 07/15/2016 12:46:10 PM By: Christin Fudge MD, FACS Signed: 07/15/2016 12:49:55 PM By: Regan Lemming BSN, RN [address] [name] ([id number]) Entered By: Regan Lemming on 07/15/2016 12:45:11 [name], [name] ([id number]) -------------------------------------------------------------------------------- Problem List Details Patient Name: KARROL, PEROT. Date of Service: 07/15/2016 9:15 AM Medical Record Number: [id number] Patient Account Number: [id number] Date of Birth/Sex: 1956/05/13 (60 y.o. 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 Date of Service: 07/15/2016 9:15 AM Medical Record Number: [id number] Patient Account Number: [id number] Date of Birth/Sex: 1956/02/10 (60 y.o. Female) Treating RN: Montey Hora Primary Care Physician: Dossie Arbour Other Clinician: Referring Physician: Dossie Arbour Treating Physician/Extender: Frann Rider in Treatment: 5 Subjective Chief Complaint Information obtained from Patient Patient presents to the wound care center for a consult due non healing wound the right lower extremity which she's had for about a month History of Present Illness (HPI) The following HPI elements were documented for the patient's wound: Location: right lower extremity Quality: Patient reports experiencing a sharp pain to affected area(s). Severity: Patient states wound are getting worse. Duration: Patient has had the wound for > 1 month prior to seeking treatment at the wound center Timing: Pain in wound is Intermittent (comes and goes Context: The wound occurred when the patient had a painful swelling which  then started discharging pus and may have been an insect bite Modifying Factors: Other treatment(s) tried include:local antibiotic ointment and oral antibiotics Associated Signs and Symptoms: Patient reports having increase discharge. 60 year-old female who presented to her family medical doctor with a painful ulcer on the right leg which she has had for about 3 weeks. she was not sure whether she had a insect bite and was initially prescribed antihistamines. Down the line she was also started on doxycycline. She continues to drain pus at the present time. Her past medical history is significant for hypertension, venous insufficiency, superficial thrombophlebitis, diverticulosis, osteoarthritis of both knees, status post gastric bypass, iron deficiency anemia,at his post laparoscopic subtotal colectomy for pancolonic diverticulosis, lumbar back pain. She had a venous duplex study done on 02/05/2016 which showed no DVT, SVT or incompetence bilaterally. 07/09/2016 -- continues to have a lot of pain in the right lateral lower leg where the ulcers are getting more superficial but larger. Arterial studies are still pending and at this stage I'm concerned about pyoderma gangrenosum and will get biopsies of the  wound edge. 07/15/2016 -- -- surgical pathology report -- DIAGNOSIS: A. SKIN, RIGHT LOWER LEG, BIOPSY: - ULCER, SEE COMMENT. 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 - NEGATIVE FOR MALIGNANCY. Comment: The biopsies are from the edge of an ulcer. The dermis contains clustered capillary proliferations, hemosiderin-laden macrophages, and fibrosis. Infiltrates of plasma cells, lymphocytes, and eosinophils are present, but neutrophils are sparse. There are focal areas of subepidermal edema, [name], Hannah M. ([id number]) hemorrhage, and necrosis, but no definite vasculitis is observed. Granulomatous inflammation is absent. There is no evidence of a neoplasm. The histologic features  are not specific, but suggest a component of venous stasis. Characteristic changes of pyoderma gangrenosum, a clinical diagnosis of exclusion, are not identified. Clinical correlation is recommended. She was seen by Dr. Sherren Mocha early on 07/14/16 - did that she had a venous duplex study in the past but did not have a reflux study. She underwent a arterial study at his office the day before and these were totally normal. He did a ultrasound in the office which showed enlargement and reflux throughout its course of the saphenous vein. Venous system could not be seen. His impression was she clearly has severe venous hypertension with at least a major component of this being related to a saphenous vein reflux. Objective Constitutional Pulse regular. Respirations normal and unlabored. Afebrile. Vitals Time Taken: 9:40 AM, Height: 64 in, Weight: 182 lbs, BMI: 31.2, Temperature: 98.2 F, Pulse: 72 bpm, Respiratory Rate: 18 breaths/min, Blood Pressure: 148/92 mmHg. Eyes Nonicteric. Reactive to light. Ears, Nose, Mouth, and Throat Lips, teeth, and gums WNL.Marland Kitchen Moist mucosa without lesions. Neck supple and nontender. No palpable supraclavicular or cervical adenopathy. Normal sized without goiter. Respiratory WNL. No retractions.. Cardiovascular Pedal Pulses WNL. No clubbing, cyanosis or edema. Lymphatic No adneopathy. No adenopathy. No adenopathy. Musculoskeletal Adexa without tenderness or enlargement.. Digits and nails w/o clubbing, cyanosis, infection, petechiae, ischemia, or inflammatory conditions.Marland Kitchen Psychiatric [name], [name] ([id number]) Judgement and insight Intact.. No evidence of depression, anxiety, or agitation.. General Notes: the ulceration on the lateral part of the leg are shallow and the base is consistent with some slough but there is not much weeping. no Sharp debridement was done today. Integumentary (Hair, Skin) No suspicious lesions. No crepitus or fluctuance. No peri-wound  warmth or erythema. No masses.. Wound #1 status is Open. Original cause of wound was Gradually Appeared. The wound is located on the Right,Proximal,Lateral Lower Leg. The wound measures 1.8cm length x 1.7cm width x 0.2cm depth; 2.403cm^2 area and 0.481cm^3 volume. The wound is limited to skin breakdown. There is no tunneling or undermining noted. There is a large amount of serosanguineous drainage noted. The wound margin is distinct with the outline attached to the wound base. There is small (1-33%) pink granulation within the wound bed. There is a large (67-100%) amount of necrotic tissue within the wound bed including Adherent Slough. The periwound skin appearance exhibited: Localized Edema, Moist, Hemosiderin Staining. The periwound skin appearance did not exhibit: Callus, Crepitus, Excoriation, Fluctuance, Friable, Induration, Rash, Scarring, Dry/Scaly, Maceration, Atrophie Blanche, Cyanosis, Ecchymosis, Mottled, Pallor, Rubor, Erythema. Periwound temperature was noted as No Abnormality. The periwound has tenderness on palpation. Wound #2 status is Open. Original cause of wound was Gradually Appeared. The wound is located on the Right,Distal,Lateral Lower Leg. The wound measures 4.3cm length x 2.6cm width x 0.2cm depth; 8.781cm^2 area and 1.756cm^3 volume. The wound is limited to skin breakdown. There is no tunneling or undermining noted. There is a large amount of serosanguineous drainage  noted. The wound margin is distinct with the outline attached to the wound base. There is small (1-33%) pink granulation within the wound bed. There is a large (67-100%) amount of necrotic tissue within the wound bed including Adherent Slough. The periwound skin appearance exhibited: Localized Edema, Moist, Hemosiderin Staining. The periwound skin appearance did not exhibit: Callus, Crepitus, Excoriation, Fluctuance, Friable, Induration, Rash, Scarring, Dry/Scaly, Maceration, Atrophie Blanche, Cyanosis,  Ecchymosis, Mottled, Pallor, Rubor, Erythema. Periwound temperature was noted as No Abnormality. The periwound has tenderness on palpation. 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 Silver alginate locally to be applied and a 3 layer Profore light compression 2. she has got hold of compression stockings of the 20-30 mm variety and she is wearing these on her left lower extremitydaily. 3. I had ordered arterial duplex studies both lower extremities -- appointment done and her vascular consultation discussed with her in details. 4. She has a venous duplex study later today 5. wedge biopsies pathology report discussed with her in detail 6. elevation and exercise. 7. regular review at the wound center Plan Wound Cleansing: Wound #1 Right,Proximal,Lateral Lower Leg: Clean wound with Normal Saline. Wound #2 Right,Distal,Lateral Lower Leg: Clean wound with Normal Saline. Anesthetic: Wound #1 Right,Proximal,Lateral Lower Leg: Topical Lidocaine 4% cream applied to wound bed prior to debridement Wound #2 Right,Distal,Lateral Lower Leg: Topical Lidocaine 4% cream applied to wound bed prior to debridement Skin Barriers/Peri-Wound Care: Wound #1 Right,Proximal,Lateral Lower Leg: Barrier cream Wound #2 Right,Distal,Lateral Lower Leg: Barrier cream Primary Wound Dressing: Wound #1 Right,Proximal,Lateral Lower Leg: Aquacel Ag Wound #2 Right,Distal,Lateral Lower Leg: Aquacel Ag Secondary Dressing: Wound #1 Right,Proximal,Lateral  Lower Leg: ABD pad Wound #2 Right,Distal,Lateral Lower Leg: ABD pad Dressing Change Frequency: Wound #1 Right,Proximal,Lateral Lower Leg: Change dressing every week Wound #2 Right,Distal,Lateral Lower Leg: Change dressing every week Follow-up Appointments: Wound #1 Right,Proximal,Lateral Lower Leg: Return Appointment in 1 week. [name], [name] ([id number]) Wound #2 Right,Distal,Lateral Lower Leg: Return Appointment in 1 week. Edema Control: Wound #1 Right,Proximal,Lateral Lower Leg: 3 Layer Compression System - Right Lower Extremity Elevate legs to the level of the heart and pump ankles as often as possible Wound #2 Right,Distal,Lateral Lower Leg: 3 Layer Compression System - Right Lower Extremity Patient to wear own compression stockings Elevate legs to the level of the heart and pump ankles as often as possible Additional Orders / Instructions: Wound #1 Right,Proximal,Lateral Lower Leg: Increase protein intake. Activity as tolerated Wound #2 Right,Distal,Lateral Lower Leg: Increase protein intake. Activity as tolerated After review her pathology and her vascular consultation I have recommended: 1. Silver alginate locally to be applied and a 3 layer Profore light compression 2. she has got hold of compression stockings of the 20-30 mm variety and she is wearing these on her left lower extremitydaily. 3. I had ordered arterial duplex studies both lower extremities -- appointment done and her vascular consultation discussed with her in details. 4. She has a venous duplex study later today 5. wedge biopsies pathology report discussed with her in detail 6. elevation and exercise. 7. regular review at the wound center Electronic Signature(s) Signed: 07/15/2016 12:47:53  PM By: Christin Fudge MD, FACS Previous Signature: 07/15/2016 10:05:24 AM Version By: Christin Fudge MD, FACS Entered By: Christin Fudge on 07/15/2016 12:47:53 [name], [name]  ([id number]) -------------------------------------------------------------------------------- SuperBill Details [name]. Date of Service: 07/15/2016 Medical Record Number: [id number] Patient Account Number: [id number] Date of Birth/Sex: 06/17/1956 (60 y.o. 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 Chronic venous hypertension (idiopathic) with ulcer and inflammation of right lower I87.331 extremity Facility Procedures CPT4: Description Modifier Quantity Code [id number] (Facility Use Only) [phone] - [place] [id number] LWR LT 1 LEG Physician Procedures CPT4: Description Modifier Quantity Code [id number] - WC PHYS LEVEL 3 - EST PT 1 ICD-10 Description Diagnosis I87.331 Chronic venous hypertension (idiopathic) with ulcer and inflammation of right lower extremity L97.212 Non-pressure chronic ulcer of  right calf with fat layer exposed I89.0 Lymphedema, not elsewhere classified Electronic Signature(s) Signed: 07/15/2016 11:39:37 AM By: Regan Lemming BSN, RN Signed: 07/15/2016 12:46:10 PM By: Christin Fudge MD, FACS Previous Signature: 07/15/2016 10:38:56 AM Version By: Regan Lemming BSN, RN Previous Signature: 07/15/2016 10:05:48 AM Version By: Christin Fudge MD, FACS Entered By: Regan Lemming on 07/15/2016 11:39:37
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
## 2016-07-15 NOTE — Progress Notes (Addendum)
CARYLE, LICHLYTER ([id number]) Visit Report for 07/15/2016 Arrival Information Details [name], [name]. Date of Service: 07/15/2016 12:45 PM Medical Record Number: [id number] Patient Account Number: [id number] Date of Birth/Sex: 27-Jun-1956 (60 y.o. Female) Treating RN: Afful, RN, BSN, Velva Harman Primary Care Physician: Dossie Arbour Other Clinician: Referring Physician: Dossie Arbour Treating Physician/Extender: Frann Rider in Treatment: 5 Visit Information History Since Last Visit All ordered tests and consults were No Patient Arrived: Ambulatory completed: Arrival Time: 12:38 Added or deleted any medications: No Accompanied By: self Any new allergies or adverse reactions: No Transfer Assistance: None Had a fall or experienced change in No Patient Identification Verified: Yes activities of daily living that may affect Secondary Verification Yes risk of falls: Process Completed: Signs or symptoms of abuse/neglect since last No Patient Requires No visito Transmission-Based Has Compression in Place as Prescribed: Yes Precautions: Pain Present Now: No Patient Has Alerts: Yes Patient Alerts: right ABI noncompressible Electronic Signature(s) Signed: 07/15/2016 12:46:38 PM By: Regan Lemming BSN, RN Entered By: Regan Lemming on 07/15/2016 12:46:38 Willis, Toy Baker ([id number]) -------------------------------------------------------------------------------- Encounter Discharge Information Details [name]. Date of Service: 07/15/2016 12:45 PM Medical Record Number: [id number] Patient Account Number: [id number] Date of Birth/Sex: [birth year] (60 y.o. Female) Treating RN: Baruch Gouty, RN, BSN, Velva Harman Primary Care Physician: Dossie Arbour Other Clinician: Referring Physician: Dossie Arbour Treating Physician/Extender: Frann Rider in Treatment: 5 Encounter Discharge Information Items Discharge Pain Level: 0 Discharge Condition: Stable Ambulatory Status:  Ambulatory Discharge Destination: Home Private Transportation: Auto Accompanied By: self Schedule Follow-up Appointment: No Medication Reconciliation completed and No provided to Patient/Care Nehemias Sauceda: Clinical Summary of Care: Electronic Signature(s) Signed: 07/15/2016 12:48:06 PM By: Regan Lemming BSN, RN Entered By: Regan Lemming on 07/15/2016 12:48:05 Fiorentino, Toy Baker ([id number]) -------------------------------------------------------------------------------- Patient/Caregiver Education Details [name]. Date of Service: 07/15/2016 12:45 PM Medical Record Number: [id number] Patient Account Number: [id number] Date of Birth/Gender: Oct 30, 1956 (60 y.o. Female) Treating RN: Baruch Gouty, RN, BSN, Velva Harman Primary Care Physician: Dossie Arbour Other Clinician: Referring Physician: Dossie Arbour Treating Physician/Extender: Frann Rider in Treatment: 5 Education Assessment Education Provided To: Patient Education Topics Provided Venous: Methods: Explain/Verbal Responses: State content correctly Welcome To [facility]: Methods: Explain/Verbal Responses: State content correctly Wound/Skin Impairment: Methods: Explain/Verbal Responses: State content correctly Electronic Signature(s) Signed: 07/15/2016 12:49:55 PM By: Regan Lemming BSN, RN Entered By: Regan Lemming on 07/15/2016 12:47:50

## 2016-07-16 ENCOUNTER — Ambulatory Visit: Payer: Commercial Managed Care - HMO | Admitting: Family Medicine

## 2016-07-20 ENCOUNTER — Encounter (HOSPITAL_BASED_OUTPATIENT_CLINIC_OR_DEPARTMENT_OTHER): Payer: Commercial Managed Care - HMO | Attending: Surgery

## 2016-07-20 DIAGNOSIS — I890 Lymphedema, not elsewhere classified: Secondary | ICD-10-CM | POA: Diagnosis not present

## 2016-07-20 DIAGNOSIS — Z9049 Acquired absence of other specified parts of digestive tract: Secondary | ICD-10-CM | POA: Insufficient documentation

## 2016-07-20 DIAGNOSIS — I83218 Varicose veins of right lower extremity with both ulcer of other part of lower extremity and inflammation: Secondary | ICD-10-CM | POA: Diagnosis not present

## 2016-07-20 DIAGNOSIS — Z9884 Bariatric surgery status: Secondary | ICD-10-CM | POA: Diagnosis not present

## 2016-07-20 DIAGNOSIS — I87331 Chronic venous hypertension (idiopathic) with ulcer and inflammation of right lower extremity: Secondary | ICD-10-CM | POA: Diagnosis not present

## 2016-07-20 DIAGNOSIS — I509 Heart failure, unspecified: Secondary | ICD-10-CM | POA: Insufficient documentation

## 2016-07-20 DIAGNOSIS — I83012 Varicose veins of right lower extremity with ulcer of calf: Secondary | ICD-10-CM | POA: Diagnosis not present

## 2016-07-20 DIAGNOSIS — I110 Hypertensive heart disease with heart failure: Secondary | ICD-10-CM | POA: Diagnosis not present

## 2016-07-20 DIAGNOSIS — L97811 Non-pressure chronic ulcer of other part of right lower leg limited to breakdown of skin: Secondary | ICD-10-CM | POA: Diagnosis not present

## 2016-07-23 DIAGNOSIS — I890 Lymphedema, not elsewhere classified: Secondary | ICD-10-CM | POA: Diagnosis not present

## 2016-07-23 DIAGNOSIS — I110 Hypertensive heart disease with heart failure: Secondary | ICD-10-CM | POA: Diagnosis not present

## 2016-07-23 DIAGNOSIS — Z9049 Acquired absence of other specified parts of digestive tract: Secondary | ICD-10-CM | POA: Diagnosis not present

## 2016-07-23 DIAGNOSIS — I509 Heart failure, unspecified: Secondary | ICD-10-CM | POA: Diagnosis not present

## 2016-07-23 DIAGNOSIS — Z9884 Bariatric surgery status: Secondary | ICD-10-CM | POA: Diagnosis not present

## 2016-07-23 DIAGNOSIS — L97811 Non-pressure chronic ulcer of other part of right lower leg limited to breakdown of skin: Secondary | ICD-10-CM | POA: Diagnosis not present

## 2016-07-23 DIAGNOSIS — I83218 Varicose veins of right lower extremity with both ulcer of other part of lower extremity and inflammation: Secondary | ICD-10-CM | POA: Diagnosis not present

## 2016-07-27 ENCOUNTER — Other Ambulatory Visit: Payer: Self-pay

## 2016-07-27 DIAGNOSIS — Z9049 Acquired absence of other specified parts of digestive tract: Secondary | ICD-10-CM | POA: Diagnosis not present

## 2016-07-27 DIAGNOSIS — I83218 Varicose veins of right lower extremity with both ulcer of other part of lower extremity and inflammation: Secondary | ICD-10-CM | POA: Diagnosis not present

## 2016-07-27 DIAGNOSIS — Z9884 Bariatric surgery status: Secondary | ICD-10-CM | POA: Diagnosis not present

## 2016-07-27 DIAGNOSIS — L97812 Non-pressure chronic ulcer of other part of right lower leg with fat layer exposed: Secondary | ICD-10-CM | POA: Diagnosis not present

## 2016-07-27 DIAGNOSIS — I87331 Chronic venous hypertension (idiopathic) with ulcer and inflammation of right lower extremity: Secondary | ICD-10-CM | POA: Diagnosis not present

## 2016-07-27 DIAGNOSIS — I890 Lymphedema, not elsewhere classified: Secondary | ICD-10-CM | POA: Diagnosis not present

## 2016-07-27 DIAGNOSIS — I110 Hypertensive heart disease with heart failure: Secondary | ICD-10-CM | POA: Diagnosis not present

## 2016-07-27 DIAGNOSIS — I509 Heart failure, unspecified: Secondary | ICD-10-CM | POA: Diagnosis not present

## 2016-07-27 DIAGNOSIS — L97811 Non-pressure chronic ulcer of other part of right lower leg limited to breakdown of skin: Secondary | ICD-10-CM | POA: Diagnosis not present

## 2016-07-27 DIAGNOSIS — I83012 Varicose veins of right lower extremity with ulcer of calf: Secondary | ICD-10-CM | POA: Diagnosis not present

## 2016-07-27 NOTE — Patient Outreach (Signed)
[facility]) Care Management  07/27/2016  CARRA COUSINO [birth year] [id number]   2nd telephone call to patient for monthly call.  No answer. HIPAA compliant voice message left.  Plan: RN Health Coach will attempt patient in the month of October.  Jone Baseman, RN, MSN [place] [phone]

## 2016-07-29 DIAGNOSIS — R300 Dysuria: Secondary | ICD-10-CM | POA: Diagnosis not present

## 2016-07-29 DIAGNOSIS — R3982 Chronic bladder pain: Secondary | ICD-10-CM | POA: Diagnosis not present

## 2016-08-02 ENCOUNTER — Other Ambulatory Visit: Payer: Self-pay | Admitting: *Deleted

## 2016-08-02 DIAGNOSIS — L97919 Non-pressure chronic ulcer of unspecified part of right lower leg with unspecified severity: Principal | ICD-10-CM

## 2016-08-02 DIAGNOSIS — I83019 Varicose veins of right lower extremity with ulcer of unspecified site: Secondary | ICD-10-CM

## 2016-08-03 ENCOUNTER — Encounter (HOSPITAL_BASED_OUTPATIENT_CLINIC_OR_DEPARTMENT_OTHER): Payer: Commercial Managed Care - HMO | Attending: Surgery

## 2016-08-03 DIAGNOSIS — L97811 Non-pressure chronic ulcer of other part of right lower leg limited to breakdown of skin: Secondary | ICD-10-CM | POA: Diagnosis not present

## 2016-08-03 DIAGNOSIS — Z9884 Bariatric surgery status: Secondary | ICD-10-CM | POA: Insufficient documentation

## 2016-08-03 DIAGNOSIS — I890 Lymphedema, not elsewhere classified: Secondary | ICD-10-CM | POA: Insufficient documentation

## 2016-08-03 DIAGNOSIS — I509 Heart failure, unspecified: Secondary | ICD-10-CM | POA: Insufficient documentation

## 2016-08-03 DIAGNOSIS — I110 Hypertensive heart disease with heart failure: Secondary | ICD-10-CM | POA: Insufficient documentation

## 2016-08-03 DIAGNOSIS — I83018 Varicose veins of right lower extremity with ulcer other part of lower leg: Secondary | ICD-10-CM | POA: Diagnosis not present

## 2016-08-03 DIAGNOSIS — I87311 Chronic venous hypertension (idiopathic) with ulcer of right lower extremity: Secondary | ICD-10-CM | POA: Diagnosis not present

## 2016-08-04 ENCOUNTER — Encounter: Payer: Commercial Managed Care - HMO | Admitting: Vascular Surgery

## 2016-08-04 NOTE — Progress Notes (Signed)
   Subjective:    Patient ID: [name], female    DOB: 12/13/1955, 60 y.o.   MRN: [id number]  HPI 60 y/o female presents for ED follow up for right leg pain swelling.  Right leg swelling/wound Seen on 9/10  In ED for increased right leg pain and swelling. LE doppler negative for DVT.  Seen by Dr. Donnetta Hutching on 9/13. Underwent venous study which found severe reflux of right saphenous vein. Recommended ablation of right saphenous vein. To be done on October 12 Reviewed wound care note from 9/14. Placed wrap on 10/3. To be replaced weekly.  Taking Tramadol 50 mg twice daily, also taking Tylenol Strength Tylenol twice daily.  Not sleeping well due to the pain. Taking Ambien 10 mg nightly as needed.   HM Due for flu vaccine, Tdap, and Zostavax Due for mammogram  History of Anemia (iron deficiency) No sob, no fatigue  Social - nonsmoker  Review of Systems  Constitutional: Negative for chills, fatigue and fever.  Respiratory: Negative for shortness of breath.   Cardiovascular: Positive for leg swelling. Negative for chest pain.       Objective:   Physical Exam BP 138/87   Pulse 87   Temp 98.5 F (36.9 C) (Oral)   Ht 5\' 4"  (1.626 m)   Wt 183 lb 6.4 oz (83.2 kg)   BMI 31.48 kg/m  Gen: pleasant female, NAD Cardiac: RRR, S1 and S2 present, no murmur Resp: CTAB, normal effort Ext: RLE - currently wrapped; LLE - trace edema  Reviewed CBC from 07/2016     Assessment & Plan:  Venous (peripheral) insufficiency Currently managed by vascular surgery and wound care. Patient getting weekly wrappings by wound care. Is schduled to have Saphenous Vein Ablation on 10/12. Pain not controlled. -gave short course of Norco to take with night to help with sleep  Iron deficiency anemia Hemgoblobin low (9.6) at last check one month ago. No current bleeding.  -recheck today.   Healthcare maintenance Flu vaccine provided in office Prescription for Zostavax and Tdap given Encouraged patient  to get mammogram

## 2016-08-05 ENCOUNTER — Ambulatory Visit (INDEPENDENT_AMBULATORY_CARE_PROVIDER_SITE_OTHER): Payer: Commercial Managed Care - HMO | Admitting: Family Medicine

## 2016-08-05 ENCOUNTER — Encounter: Payer: Self-pay | Admitting: Family Medicine

## 2016-08-05 DIAGNOSIS — Z0000 Encounter for general adult medical examination without abnormal findings: Secondary | ICD-10-CM

## 2016-08-05 DIAGNOSIS — Z23 Encounter for immunization: Secondary | ICD-10-CM | POA: Diagnosis not present

## 2016-08-05 DIAGNOSIS — D509 Iron deficiency anemia, unspecified: Secondary | ICD-10-CM

## 2016-08-05 DIAGNOSIS — I872 Venous insufficiency (chronic) (peripheral): Secondary | ICD-10-CM | POA: Diagnosis not present

## 2016-08-05 LAB — CBC WITH DIFFERENTIAL/PLATELET
Basophils Absolute: 0 cells/uL (ref 0–200)
Basophils Relative: 0 %
Eosinophils Absolute: 260 cells/uL (ref 15–500)
Eosinophils Relative: 5 %
HCT: 31.8 % — ABNORMAL LOW (ref 35.0–45.0)
Hemoglobin: 9.9 g/dL — ABNORMAL LOW (ref 11.7–15.5)
Lymphocytes Relative: 28 %
Lymphs Abs: 1456 cells/uL (ref 850–3900)
MCH: 26.4 pg — ABNORMAL LOW (ref 27.0–33.0)
MCHC: 31.1 g/dL — ABNORMAL LOW (ref 32.0–36.0)
MCV: 84.8 fL (ref 80.0–100.0)
MPV: 9.3 fL (ref 7.5–12.5)
Monocytes Absolute: 416 cells/uL (ref 200–950)
Monocytes Relative: 8 %
Neutro Abs: 3068 cells/uL (ref 1500–7800)
Neutrophils Relative %: 59 %
Platelets: 290 10*3/uL (ref 140–400)
RBC: 3.75 MIL/uL — ABNORMAL LOW (ref 3.80–5.10)
RDW: 15.4 % — ABNORMAL HIGH (ref 11.0–15.0)
WBC: 5.2 10*3/uL (ref 3.8–10.8)

## 2016-08-05 MED ORDER — HYDROCODONE-ACETAMINOPHEN 5-325 MG PO TABS: 1.0000 | ORAL_TABLET | Freq: Four times a day (QID) | ORAL | 0 refills | Status: DC | PRN

## 2016-08-05 NOTE — Assessment & Plan Note (Signed)
Flu vaccine provided in office Prescription for Zostavax and Tdap given Encouraged patient to get mammogram

## 2016-08-05 NOTE — Assessment & Plan Note (Signed)
Hemgoblobin low (9.6) at last check one month ago. No current bleeding.  -recheck today.

## 2016-08-05 NOTE — Assessment & Plan Note (Signed)
Currently managed by vascular surgery and wound care. Patient getting weekly wrappings by wound care. Is schduled to have Saphenous Vein Ablation on 10/12. Pain not controlled. -gave short course of Norco to take with night to help with sleep

## 2016-08-05 NOTE — Patient Instructions (Signed)
It was nice to see you today.  For you leg pain. Start Norco (pain medication). Do NOT take with Tylenol.  Do NOT take Tramadol and Norco within 6 hours of each other.

## 2016-08-06 ENCOUNTER — Encounter: Payer: Self-pay | Admitting: Family Medicine

## 2016-08-10 ENCOUNTER — Encounter: Payer: Self-pay | Admitting: Vascular Surgery

## 2016-08-10 DIAGNOSIS — L97812 Non-pressure chronic ulcer of other part of right lower leg with fat layer exposed: Secondary | ICD-10-CM | POA: Diagnosis not present

## 2016-08-10 DIAGNOSIS — I110 Hypertensive heart disease with heart failure: Secondary | ICD-10-CM | POA: Diagnosis not present

## 2016-08-10 DIAGNOSIS — I890 Lymphedema, not elsewhere classified: Secondary | ICD-10-CM | POA: Diagnosis not present

## 2016-08-10 DIAGNOSIS — I509 Heart failure, unspecified: Secondary | ICD-10-CM | POA: Diagnosis not present

## 2016-08-10 DIAGNOSIS — I872 Venous insufficiency (chronic) (peripheral): Secondary | ICD-10-CM | POA: Diagnosis not present

## 2016-08-10 DIAGNOSIS — Z9884 Bariatric surgery status: Secondary | ICD-10-CM | POA: Diagnosis not present

## 2016-08-10 DIAGNOSIS — L97811 Non-pressure chronic ulcer of other part of right lower leg limited to breakdown of skin: Secondary | ICD-10-CM | POA: Diagnosis not present

## 2016-08-10 DIAGNOSIS — I83018 Varicose veins of right lower extremity with ulcer other part of lower leg: Secondary | ICD-10-CM | POA: Diagnosis not present

## 2016-08-12 ENCOUNTER — Encounter: Payer: Self-pay | Admitting: Vascular Surgery

## 2016-08-12 ENCOUNTER — Ambulatory Visit (INDEPENDENT_AMBULATORY_CARE_PROVIDER_SITE_OTHER): Payer: Commercial Managed Care - HMO | Admitting: Vascular Surgery

## 2016-08-12 VITALS — BP 132/89 | HR 78 | Temp 97.3°F | Resp 16 | Ht 64.0 in | Wt 184.0 lb

## 2016-08-12 DIAGNOSIS — I83013 Varicose veins of right lower extremity with ulcer of ankle: Secondary | ICD-10-CM | POA: Diagnosis not present

## 2016-08-12 DIAGNOSIS — L97319 Non-pressure chronic ulcer of right ankle with unspecified severity: Principal | ICD-10-CM

## 2016-08-12 HISTORY — PX: ENDOVENOUS ABLATION SAPHENOUS VEIN W/ LASER: SUR449

## 2016-08-12 NOTE — Progress Notes (Signed)
     Laser Ablation Procedure    Date: 08/12/2016   [name] DOB:November 27, 1955  Consent signed: Yes    Surgeon:  Dr. Sherren Mocha Early  Procedure: Laser Ablation: right Greater Saphenous Vein  BP 132/89 (BP Location: Left Arm, Patient Position: Sitting, Cuff Size: Large)   Pulse 78   Temp 97.3 F (36.3 C) (Oral)   Resp 16   Ht 5\' 4"  (1.626 m)   Wt 184 lb (83.5 kg)   SpO2 100%   BMI 31.58 kg/m   Tumescent Anesthesia: 500 cc 0.9% NaCl with 50 cc Lidocaine HCL with 1% Epi and 15 cc 8.4% NaHCO3  Local Anesthesia: 4 cc Lidocaine HCL and NaHCO3 (ratio 2:1)  15 watts continuous mode        Total energy: 2636 Joules   Total time: 2:55      Patient tolerated procedure well    Description of Procedure:  After marking the course of the secondary varicosities, the patient was placed on the operating table in the supine position, and the right leg was prepped and draped in sterile fashion.   Local anesthetic was administered and under ultrasound guidance the saphenous vein was accessed with a micro needle and guide wire; then the mirco puncture sheath was placed.  A guide wire was inserted saphenofemoral junction , followed by a 5 french sheath.  The position of the sheath and then the laser fiber below the junction was confirmed using the ultrasound.  Tumescent anesthesia was administered along the course of the saphenous vein using ultrasound guidance. The patient was placed in Trendelenburg position and protective laser glasses were placed on patient and staff, and the laser was fired at 15 watts continuous mode advancing 1-100mm/second for a total of 2636 joules.         Steri strip was applied.  ABD pads placed over thigh and Ace wrap bandages were applied over the thigh and at the top of the saphenofemoral junction. Dressing from Wound care center on right calf undisturbed and knee high compression hose placed over dressing on right calf. Blood loss was less than 15 cc.  The patient  ambulated out of the operating room having tolerated the procedure well.  Uneventful ablation from mid calf to just below the saphenofemoral junction. Follow-up in one week

## 2016-08-13 ENCOUNTER — Telehealth: Payer: Self-pay | Admitting: *Deleted

## 2016-08-13 ENCOUNTER — Telehealth: Payer: Self-pay | Admitting: Vascular Surgery

## 2016-08-13 ENCOUNTER — Encounter: Payer: Self-pay | Admitting: Vascular Surgery

## 2016-08-13 NOTE — Telephone Encounter (Signed)
error 

## 2016-08-13 NOTE — Telephone Encounter (Signed)
    08/13/2016  Time: 1:16 PM   [name]  Patient of: T.F. Early  Procedure:Laser Ablation right greater saphenous vein 08-12-2016  Reached patient at home and checked  Her status  Yes    Comments/Actions Taken: [name] states she is having discomfort right inner thigh (area treated). [name] is unable to take NSAIDS due to hx of GI bleed.  Recommended she take Tylenol extra strength for leg pain, use ice compress to right inner thigh, keep right leg elevated, keep compression dressing on as instructed. Reviewed post procedural instructions with her and reminded her of post LA duplex and VV follow up appointment with Dr. Donnetta Hutching on 08-19-2016.      @SIGNATURE @

## 2016-08-16 ENCOUNTER — Encounter: Payer: Self-pay | Admitting: Vascular Surgery

## 2016-08-17 ENCOUNTER — Encounter: Payer: Self-pay | Admitting: Family Medicine

## 2016-08-17 DIAGNOSIS — S81801A Unspecified open wound, right lower leg, initial encounter: Secondary | ICD-10-CM | POA: Diagnosis not present

## 2016-08-17 DIAGNOSIS — R300 Dysuria: Secondary | ICD-10-CM

## 2016-08-17 MED ORDER — URIBEL 118 MG PO CAPS: 1.0000 | ORAL_CAPSULE | Freq: Two times a day (BID) | ORAL | 0 refills | Status: DC | PRN

## 2016-08-17 NOTE — Progress Notes (Signed)
Reviewed Urology Note from 07/29/16 Started on Uribel for dysuria. Added Uribel to medication list.  Will place document in scan box.

## 2016-08-18 DIAGNOSIS — Z9884 Bariatric surgery status: Secondary | ICD-10-CM | POA: Diagnosis not present

## 2016-08-18 DIAGNOSIS — L97212 Non-pressure chronic ulcer of right calf with fat layer exposed: Secondary | ICD-10-CM | POA: Diagnosis not present

## 2016-08-18 DIAGNOSIS — S81801A Unspecified open wound, right lower leg, initial encounter: Secondary | ICD-10-CM | POA: Diagnosis not present

## 2016-08-18 DIAGNOSIS — L97811 Non-pressure chronic ulcer of other part of right lower leg limited to breakdown of skin: Secondary | ICD-10-CM | POA: Diagnosis not present

## 2016-08-18 DIAGNOSIS — I87331 Chronic venous hypertension (idiopathic) with ulcer and inflammation of right lower extremity: Secondary | ICD-10-CM | POA: Diagnosis not present

## 2016-08-18 DIAGNOSIS — I110 Hypertensive heart disease with heart failure: Secondary | ICD-10-CM | POA: Diagnosis not present

## 2016-08-18 DIAGNOSIS — I890 Lymphedema, not elsewhere classified: Secondary | ICD-10-CM | POA: Diagnosis not present

## 2016-08-18 DIAGNOSIS — I83018 Varicose veins of right lower extremity with ulcer other part of lower leg: Secondary | ICD-10-CM | POA: Diagnosis not present

## 2016-08-18 DIAGNOSIS — I509 Heart failure, unspecified: Secondary | ICD-10-CM | POA: Diagnosis not present

## 2016-08-18 DIAGNOSIS — I83012 Varicose veins of right lower extremity with ulcer of calf: Secondary | ICD-10-CM | POA: Diagnosis not present

## 2016-08-19 ENCOUNTER — Ambulatory Visit (HOSPITAL_COMMUNITY)
Admission: RE | Admit: 2016-08-19 | Discharge: 2016-08-19 | Disposition: A | Discharge status: Home / Self Care | Payer: Commercial Managed Care - HMO | Source: Ambulatory Visit | Attending: Vascular Surgery | Admitting: Vascular Surgery

## 2016-08-19 ENCOUNTER — Ambulatory Visit (INDEPENDENT_AMBULATORY_CARE_PROVIDER_SITE_OTHER): Payer: Commercial Managed Care - HMO | Admitting: Vascular Surgery

## 2016-08-19 ENCOUNTER — Encounter: Payer: Self-pay | Admitting: Vascular Surgery

## 2016-08-19 VITALS — BP 136/92 | HR 81 | Temp 97.5°F | Resp 16 | Ht 64.0 in | Wt 170.0 lb

## 2016-08-19 DIAGNOSIS — L97319 Non-pressure chronic ulcer of right ankle with unspecified severity: Principal | ICD-10-CM

## 2016-08-19 DIAGNOSIS — I83013 Varicose veins of right lower extremity with ulcer of ankle: Secondary | ICD-10-CM

## 2016-08-19 DIAGNOSIS — L97219 Non-pressure chronic ulcer of right calf with unspecified severity: Secondary | ICD-10-CM | POA: Diagnosis not present

## 2016-08-19 DIAGNOSIS — I83019 Varicose veins of right lower extremity with ulcer of unspecified site: Secondary | ICD-10-CM | POA: Diagnosis not present

## 2016-08-19 DIAGNOSIS — I83012 Varicose veins of right lower extremity with ulcer of calf: Secondary | ICD-10-CM | POA: Diagnosis not present

## 2016-08-19 DIAGNOSIS — L97919 Non-pressure chronic ulcer of unspecified part of right lower leg with unspecified severity: Secondary | ICD-10-CM

## 2016-08-19 NOTE — Progress Notes (Signed)
Vascular and Vein Specialist of [facility]  [name] MRN: [id number] DOB: [birth year] Sex: female  REASON FOR VISIT: One-week follow-up after right great saphenous vein ablation  HPI: [name] is a 60 y.o. female one-week status post right great saphenous vein ablation for severe venous hypertension. She did quite well with her procedure and has done well in the week following this. She did have the typical thigh discomfort which was significantly over the first 2 days but has now eased. She has minimal bruising.  Past Medical History:  Diagnosis Date  . Anemia   . Arthritis    knees  . Back pain 10/04/2012  . Blood in stool 10/07/2014  . Blood transfusion without reported diagnosis   . Bunion 03/28/2014  . 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 Degenerative arthritis of right shoulder region 08/2013  . Diverticulosis   . GERD (gastroesophageal reflux disease)   . High cholesterol   . History of GI bleed   . Hypertension    under control with meds., has been on med. > 20 yr.  . Lower GI bleed   . Medial meniscus tear 1997   Right Knee  . Mini stroke ([place])   . Rotator cuff rupture, complete 08/2013   right  . Shoulder impingement 08/2013   right  . Stroke [facility]) 2006   Remote left lacunar infarct noted on CT head 2006, 2010   . Ulcer ([place])   . Wears dentures    upper  . Wears partial dentures    lower    Family History  Problem Relation Age of Onset  . Cancer Father 60    Died from complications of colon CA  . Colon cancer Father     died in his 22s  . Diabetes Mother   . Hypertension Mother   . Diabetes Sister   . Heart disease Sister   . Diabetes Sister   . Stroke Sister   . Diabetes Brother   . Stroke Brother   . Diabetes Brother   . Diabetes Brother   . Diabetes Brother     SOCIAL HISTORY: Social History  Substance Use Topics  . Smoking  status: Never Smoker  . Smokeless tobacco: Never Used  . Alcohol use 0.0 oz/week     Comment: occasional wine    Allergies  Allergen Reactions  . Aspirin Other (See Comments)    CAN NOT TAKE DUE TO GI BLEED GI BLEED  . Nsaids Other (See Comments)    GI BLEED  . Tolmetin Other (See Comments)    GI BLEED    Current Outpatient Prescriptions  Medication Sig Dispense Refill  . aspirin 81 MG tablet Take 81 mg by mouth daily.    Marland Kitchen atorvastatin (LIPITOR) 40 MG tablet TAKE ONE TABLET EACH DAY 90 tablet 1  . calcium carbonate (OS-CAL) 600 MG TABS tablet Take 1 tablet (600 mg total) by mouth 2 (two) times daily with a meal. 180 tablet 1  . cetirizine (ZYRTEC) 10 MG tablet Take 1 tablet (10 mg total) by mouth daily. 30 tablet 11  . Cholecalciferol (D3 MAXIMUM STRENGTH) 5000 units capsule Take 5,000 Units by mouth daily.    . cyclobenzaprine (FLEXERIL) 10 MG tablet Take 1 tablet (10 mg total) by mouth 3 (three) times daily as needed for muscle spasms. 8 tablet 0  . diclofenac sodium (VOLTAREN) 1 % GEL Apply 2 g  topically 4 (four) times daily. 100 g 0  . docusate sodium (DOK) 100 MG capsule TAKE ONE CAPSULE TWICE A DAY AS NEEDED FOR CONSTIPATION 180 capsule 1  . esomeprazole (NEXIUM) 40 MG capsule Take 1 tab every morning. 30 capsule 11  . ferrous sulfate 220 (44 FE) MG/5ML solution Take 5 mLs (220 mg total) by mouth 2 (two) times daily with a meal. 473 mL 2  . fluticasone (FLONASE) 50 MCG/ACT nasal spray Place 2 sprays into both nostrils at bedtime. 16 g 6  . furosemide (LASIX) 20 MG tablet TAKE TWO TABLETS EVERY DAY 60 tablet 2  . HYDROcodone-acetaminophen (NORCO) 5-325 MG tablet Take 1 tablet by mouth every 6 (six) hours as needed for moderate pain. 20 tablet 0  . lisinopril (PRINIVIL,ZESTRIL) 20 MG tablet Take 1 tablet (20 mg total) by mouth daily. 90 tablet 1  . Meth-Hyo-M Bl-Na Phos-Ph Sal (URIBEL) 118 MG CAPS Take 1 capsule (118 mg total) by mouth 2 (two) times daily as needed. 120 capsule  0  . Multiple Vitamin (MULTIVITAMIN) tablet Take 1 tablet by mouth daily. 90 tablet 1  . polyethylene glycol powder (GLYCOLAX/MIRALAX) powder TAKE 17G BY MOUTH TWICE DAILY 850 g 2  . sucralfate (CARAFATE) 1 GM/10ML suspension Take 10 mLs (1 g total) by mouth 4 (four) times daily -  with meals and at bedtime. 420 mL 1  . traMADol (ULTRAM) 50 MG tablet Take 1 tablet (50 mg total) by mouth every 8 (eight) hours as needed for moderate pain or severe pain. 60 tablet 1  . vitamin B-12 (CYANOCOBALAMIN) 1000 MCG tablet Take 1,000 mcg by mouth daily.    . vitamin C (ASCORBIC ACID) 250 MG tablet Take 250 mg by mouth daily.    . vitamin E 400 UNIT capsule Take 400 Units by mouth daily.    . Vitamins A & D 5000-400 units CAPS Take 5,000 Units by mouth daily.    Marland Kitchen zolpidem (AMBIEN) 10 MG tablet TAKE ONE TABLET AT BEDTIME AS NEEDED FOR SLEEP 30 tablet 2   No current facility-administered medications for this visit.     REVIEW OF SYSTEMS:  [X]  denotes positive finding, [ ]  denotes negative finding Cardiac  Comments:  Chest pain or chest pressure:    Shortness of breath upon exertion:    Short of breath when lying flat:    Irregular heart rhythm:        Vascular    Pain in calf, thigh, or hip brought on by ambulation:    Pain in feet at night that wakes you up from your sleep:     Blood clot in your veins:    Leg swelling:           PHYSICAL EXAM: Vitals:   08/19/16 0939  BP: (!) 136/92  Pulse: 81  Resp: 16  Temp: 97.5 F (36.4 C)  SpO2: 100%  Weight: 170 lb (77.1 kg)  Height: 5\' 4"  (1.626 m)    GENERAL: The patient is a well-nourished female, in no acute distress. The vital signs are documented above. CARDIOVASCULAR: Palpable dorsalis pedis pulse PULMONARY: There is good air exchange  MUSCULOSKELETAL: There are no major deformities or cyanosis. NEUROLOGIC: No focal weakness or paresthesias are detected. SKIN: She has a dressing over ulcer on her right lateral distal calf. Mild  bruising in her thigh PSYCHIATRIC: The patient has a normal affect.  DATA:  Duplex today shows no evidence of DVT. She has closure of her great saphenous vein from the  proximal calf insertion to 2 cm below the saphenofemoral junction.  MEDICAL ISSUES: Good Adelaida Reindel result from laser ablation of her great saphenous vein. We'll continue her compression garment for one additional week. Will continue follow-up with wound care center until she has complete healing of her venous ulcer. She will see [place] again on an as-needed basis    Rosetta Posner, MD [facility] Vascular and Vein Specialists of [facility] Tel [phone] Pager [phone]

## 2016-08-23 ENCOUNTER — Other Ambulatory Visit: Payer: Self-pay

## 2016-08-23 NOTE — Patient Outreach (Signed)
[facility]) Care Management  [place]  08/23/2016   [name] 03/23/1956 [id number]  Subjective: Telephone cal to patient for monthly call.  Patient reports she is doing ok still dealing with leg wound. Patient reports that she is having her leg wrapped once a week and recently had a procedure to assist with healing.  She reports having a follow up appointment tomorrow. Patient reports her most recent blood sugar was 94. She report she tries to keep her sugars under control and mainly under 120.  Reiterated with patient importance of diet and portion control to control sugars. She verbalized understanding.  No concerns voiced.    Objective:   Encounter Medications:  Outpatient Encounter Prescriptions as of 08/23/2016  Medication Sig Note  . atorvastatin (LIPITOR) 40 MG tablet TAKE ONE TABLET EACH DAY   . calcium carbonate (OS-CAL) 600 MG TABS tablet Take 1 tablet (600 mg total) by mouth 2 (two) times daily with a meal.   . cetirizine (ZYRTEC) 10 MG tablet Take 1 tablet (10 mg total) by mouth daily.   . Cholecalciferol (D3 MAXIMUM STRENGTH) 5000 units capsule Take 5,000 Units by mouth daily.   . diclofenac sodium (VOLTAREN) 1 % GEL Apply 2 g topically 4 (four) times daily.   Marland Kitchen docusate sodium (DOK) 100 MG capsule TAKE ONE CAPSULE TWICE A DAY AS NEEDED FOR CONSTIPATION   . esomeprazole (NEXIUM) 40 MG capsule Take 1 tab every morning.   . ferrous sulfate 220 (44 FE) MG/5ML solution Take 5 mLs (220 mg total) by mouth 2 (two) times daily with a meal.   . fluticasone (FLONASE) 50 MCG/ACT nasal spray Place 2 sprays into both nostrils at bedtime.   . furosemide (LASIX) 20 MG tablet TAKE TWO TABLETS EVERY DAY   . HYDROcodone-acetaminophen (NORCO) 5-325 MG tablet Take 1 tablet by mouth every 6 (six) hours as needed for moderate pain.   Marland Kitchen lisinopril (PRINIVIL,ZESTRIL) 20 MG tablet Take 1 tablet (20 mg total) by mouth daily.   . Meth-Hyo-M Bl-Na Phos-Ph Sal (URIBEL) 118  MG CAPS Take 1 capsule (118 mg total) by mouth 2 (two) times daily as needed.   . Multiple Vitamin (MULTIVITAMIN) tablet Take 1 tablet by mouth daily.   . polyethylene glycol powder (GLYCOLAX/MIRALAX) powder TAKE 17G BY MOUTH TWICE DAILY   . traMADol (ULTRAM) 50 MG tablet Take 1 tablet (50 mg total) by mouth every 8 (eight) hours as needed for moderate pain or severe pain.   . vitamin B-12 (CYANOCOBALAMIN) 1000 MCG tablet Take 1,000 mcg by mouth daily.   . vitamin C (ASCORBIC ACID) 250 MG tablet Take 250 mg by mouth daily.   . vitamin E 400 UNIT capsule Take 400 Units by mouth daily.   . Vitamins A & D 5000-400 units CAPS Take 5,000 Units by mouth daily. 05/13/2016: Received from: External Pharmacy Received Sig:   . zolpidem (AMBIEN) 10 MG tablet TAKE ONE TABLET AT BEDTIME AS NEEDED FOR SLEEP   . aspirin 81 MG tablet Take 81 mg by mouth daily.   . cyclobenzaprine (FLEXERIL) 10 MG tablet Take 1 tablet (10 mg total) by mouth 3 (three) times daily as needed for muscle spasms. (Patient not taking: Reported on 08/23/2016) 03/08/2016: Has not filled prescription   . sucralfate (CARAFATE) 1 GM/10ML suspension Take 10 mLs (1 g total) by mouth 4 (four) times daily -  with meals and at bedtime.    No facility-administered encounter medications on file as of 08/23/2016.  Functional Status:  In your present state of health, do you have any difficulty performing the following activities: 02/23/2016 01/01/2016  Hearing? N N  Vision? N N  Difficulty concentrating or making decisions? N N  Walking or climbing stairs? N Y  Dressing or bathing? N N  Doing errands, shopping? N Y  Conservation officer, nature and eating ? N N  Using the Toilet? N N  In the past six months, have you accidently leaked urine? N N  Do you have problems with loss of bowel control? N Y  Managing your Medications? N N  Managing your Finances? N N  Housekeeping or managing your Housekeeping? N N  Some recent data might be hidden     Fall/Depression Screening: PHQ 2/9 Scores 08/05/2016 06/08/2016 05/27/2016 05/19/2016 05/11/2016 04/27/2016 03/26/2016  PHQ - 2 Score 0 0 0 0 0 0 0  PHQ- 9 Score 0 - - - - - -    Assessment: Patient continues to benefit from health coach outreach for disease management and support.   Plan:  [facility] CM Care Plan Problem One   Flowsheet Row Most Recent Value  Care Plan Problem One  Diabetes education/reiforcement  Role Documenting the Problem One  Health [place] for Problem One  Active  THN Long Term Goal (31-90 days)  Patient will maintain A1c less than 7 within 90 days.  THN Long Term Goal Start Date  08/23/16 [goal continued]  Interventions for Problem One Long Term Goal  RN Health Coach reiterated with patient A1c and goal of keeping less than 7.  THN CM Short Term Goal #1 (0-30 days)  Patient will maintain diet of low carbhydrates within 30 days.  THN CM Short Term Goal #1 Start Date  08/23/16 [goal continued]  Interventions for Short Term Goal #1  RN Health Coach reiterated with patient importance of maintaining low carbohydrate diet.    THN CM Short Term Goal #2 (0-30 days)  Patient will continue to monitor blood sugars twice a day within 30 days.  THN CM Short Term Goal #2 Start Date  06/08/16  [facility] CM Short Term Goal #2 Met Date  08/23/16  Interventions for Short Term Goal #2  Patient checking sugars regularly. goal met  THN CM Short Term Goal #3 (0-30 days)  Patient will report  some improvement of wound to right leg within 30 days.    THN CM Short Term Goal #3 Start Date  08/23/16  Interventions for Short Tern Goal #3  [place] reviewed with patient things to promote wound healing, signs of infection, and importance of keeping blood sugar under control.      RN Health Coach will contact patient in the month of November and patient agrees to next outreach.  Jone Baseman, RN, MSN [place] [phone]

## 2016-08-24 ENCOUNTER — Telehealth: Payer: Self-pay | Admitting: Family Medicine

## 2016-08-24 NOTE — Telephone Encounter (Signed)
Pt stated that is the correct spelling and it was something she was prescribed about a month and a half ago by one of the residents. The medication is also something pt received from the wound care clinic. Please advise. Thanks! ep

## 2016-08-24 NOTE — Telephone Encounter (Signed)
Please call patient to clarify the spelling of medication.  Do not see medication listed on current medication list.  Derl Barrow, RN

## 2016-08-24 NOTE — Telephone Encounter (Signed)
Patient returned call. Would like refill of Santyl. I have not personally seen the wound lately and informed her that I did not feel comfortable prescribing this medication. Patient expressed understanding and will call wound care clinic to get prescription.

## 2016-08-24 NOTE — Telephone Encounter (Signed)
As best I can tell patient's PCP is Dr Ree Kida (see encounters)  Apparently her insurance has me listed as PCP and every time she sees a specialist they change the PCP to me so they can bill for the visit  [name]  Please call patient and ask her to contact her insurance and change PCP to Dr Ree Kida so she can receive her correct medications etc Then route this to Dr Ree Kida  Thanks all   [facility]

## 2016-08-24 NOTE — Telephone Encounter (Signed)
Pt stated the pharmacy does not have her Rx for Santyl. Pt says she needs this ASAP for a sore. Pharmacy on file is correct. Please advise. Thanks! ep

## 2016-08-24 NOTE — Telephone Encounter (Signed)
Will forward to PCP.  Medication is not listed on current med list.  Derl Barrow, RN

## 2016-08-24 NOTE — Telephone Encounter (Signed)
Returned patient call. No answer. Left message for patient to call office again.

## 2016-08-25 DIAGNOSIS — I890 Lymphedema, not elsewhere classified: Secondary | ICD-10-CM | POA: Diagnosis not present

## 2016-08-25 DIAGNOSIS — Z9884 Bariatric surgery status: Secondary | ICD-10-CM | POA: Diagnosis not present

## 2016-08-25 DIAGNOSIS — I83012 Varicose veins of right lower extremity with ulcer of calf: Secondary | ICD-10-CM | POA: Diagnosis not present

## 2016-08-25 DIAGNOSIS — I87311 Chronic venous hypertension (idiopathic) with ulcer of right lower extremity: Secondary | ICD-10-CM | POA: Diagnosis not present

## 2016-08-25 DIAGNOSIS — I509 Heart failure, unspecified: Secondary | ICD-10-CM | POA: Diagnosis not present

## 2016-08-25 DIAGNOSIS — I83018 Varicose veins of right lower extremity with ulcer other part of lower leg: Secondary | ICD-10-CM | POA: Diagnosis not present

## 2016-08-25 DIAGNOSIS — I110 Hypertensive heart disease with heart failure: Secondary | ICD-10-CM | POA: Diagnosis not present

## 2016-08-25 DIAGNOSIS — L97211 Non-pressure chronic ulcer of right calf limited to breakdown of skin: Secondary | ICD-10-CM | POA: Diagnosis not present

## 2016-08-25 DIAGNOSIS — L97811 Non-pressure chronic ulcer of other part of right lower leg limited to breakdown of skin: Secondary | ICD-10-CM | POA: Diagnosis not present

## 2016-09-01 ENCOUNTER — Encounter (HOSPITAL_BASED_OUTPATIENT_CLINIC_OR_DEPARTMENT_OTHER): Payer: Commercial Managed Care - HMO | Attending: Surgery

## 2016-09-01 ENCOUNTER — Other Ambulatory Visit: Payer: Self-pay | Admitting: *Deleted

## 2016-09-01 ENCOUNTER — Other Ambulatory Visit: Payer: Self-pay | Admitting: Family Medicine

## 2016-09-01 DIAGNOSIS — L97812 Non-pressure chronic ulcer of other part of right lower leg with fat layer exposed: Secondary | ICD-10-CM | POA: Diagnosis not present

## 2016-09-01 DIAGNOSIS — I10 Essential (primary) hypertension: Secondary | ICD-10-CM | POA: Insufficient documentation

## 2016-09-01 DIAGNOSIS — I87331 Chronic venous hypertension (idiopathic) with ulcer and inflammation of right lower extremity: Secondary | ICD-10-CM | POA: Diagnosis not present

## 2016-09-01 DIAGNOSIS — L97212 Non-pressure chronic ulcer of right calf with fat layer exposed: Secondary | ICD-10-CM | POA: Diagnosis not present

## 2016-09-01 DIAGNOSIS — L97222 Non-pressure chronic ulcer of left calf with fat layer exposed: Secondary | ICD-10-CM | POA: Insufficient documentation

## 2016-09-01 DIAGNOSIS — S81801A Unspecified open wound, right lower leg, initial encounter: Secondary | ICD-10-CM | POA: Diagnosis not present

## 2016-09-01 DIAGNOSIS — I83012 Varicose veins of right lower extremity with ulcer of calf: Secondary | ICD-10-CM | POA: Diagnosis not present

## 2016-09-01 DIAGNOSIS — I890 Lymphedema, not elsewhere classified: Secondary | ICD-10-CM | POA: Diagnosis not present

## 2016-09-01 MED ORDER — ESOMEPRAZOLE MAGNESIUM 40 MG PO CPDR: DELAYED_RELEASE_CAPSULE | ORAL | 1 refills | Status: DC

## 2016-09-07 ENCOUNTER — Other Ambulatory Visit: Payer: Self-pay | Admitting: Family Medicine

## 2016-09-07 DIAGNOSIS — I872 Venous insufficiency (chronic) (peripheral): Secondary | ICD-10-CM

## 2016-09-07 DIAGNOSIS — L97812 Non-pressure chronic ulcer of other part of right lower leg with fat layer exposed: Secondary | ICD-10-CM | POA: Diagnosis not present

## 2016-09-07 DIAGNOSIS — L97222 Non-pressure chronic ulcer of left calf with fat layer exposed: Secondary | ICD-10-CM | POA: Diagnosis not present

## 2016-09-07 DIAGNOSIS — I10 Essential (primary) hypertension: Secondary | ICD-10-CM | POA: Diagnosis not present

## 2016-09-07 DIAGNOSIS — I87331 Chronic venous hypertension (idiopathic) with ulcer and inflammation of right lower extremity: Secondary | ICD-10-CM | POA: Diagnosis not present

## 2016-09-07 MED ORDER — HYDROCODONE-ACETAMINOPHEN 5-325 MG PO TABS: 1.0000 | ORAL_TABLET | Freq: Four times a day (QID) | ORAL | 0 refills | Status: DC | PRN

## 2016-09-07 NOTE — Telephone Encounter (Signed)
RN staff - please call in Norco 5-325 Q 6 hours prn pain, dispense #60, no refill. Please inform patient that medication was called into pharmacy. Please also notify patient that she will need an appointment if she needs another prescription for this mediation in the future.

## 2016-09-07 NOTE — Telephone Encounter (Signed)
Medication can not be called into pharmacy.  Medication is a C2 and need hard copy of the prescription.  Please advise.  Derl Barrow, RN

## 2016-09-07 NOTE — Telephone Encounter (Signed)
Pt would like Dr. Ree Kida to call her because she needs a Rx for pain medication for wound on pt's leg. Pt states wound doctor does not prescribe pain medication, it has to come from PCP. Please advise. Thanks! ep

## 2016-09-07 NOTE — Telephone Encounter (Signed)
Will forward to PCP.  Lindzie Boxx L, RN  

## 2016-09-08 ENCOUNTER — Ambulatory Visit (INDEPENDENT_AMBULATORY_CARE_PROVIDER_SITE_OTHER): Payer: Commercial Managed Care - HMO | Admitting: Podiatry

## 2016-09-08 ENCOUNTER — Encounter: Payer: Self-pay | Admitting: Podiatry

## 2016-09-08 VITALS — BP 154/101 | HR 81 | Resp 18

## 2016-09-08 DIAGNOSIS — M79675 Pain in left toe(s): Secondary | ICD-10-CM

## 2016-09-08 DIAGNOSIS — M79674 Pain in right toe(s): Secondary | ICD-10-CM | POA: Diagnosis not present

## 2016-09-08 DIAGNOSIS — B351 Tinea unguium: Secondary | ICD-10-CM | POA: Diagnosis not present

## 2016-09-08 MED ORDER — HYDROCODONE-ACETAMINOPHEN 5-325 MG PO TABS: 1.0000 | ORAL_TABLET | Freq: Four times a day (QID) | ORAL | 0 refills | Status: DC | PRN

## 2016-09-08 NOTE — Patient Instructions (Signed)
Diabetes and Foot Care Diabetes may cause you to have problems because of poor blood supply (circulation) to your feet and legs. This may cause the skin on your feet to become thinner, break easier, and heal more slowly. Your skin may become dry, and the skin may peel and crack. You may also have nerve damage in your legs and feet causing decreased feeling in them. You may not notice minor injuries to your feet that could lead to infections or more serious problems. Taking care of your feet is one of the most important things you can do for yourself.  HOME CARE INSTRUCTIONS  Wear shoes at all times, even in the house. Do not go barefoot. Bare feet are easily injured.  Check your feet daily for blisters, cuts, and redness. If you cannot see the bottom of your feet, use a mirror or ask someone for help.  Wash your feet with warm water (do not use hot water) and mild soap. Then pat your feet and the areas between your toes until they are completely dry. Do not soak your feet as this can dry your skin.  Apply a moisturizing lotion or petroleum jelly (that does not contain alcohol and is unscented) to the skin on your feet and to dry, brittle toenails. Do not apply lotion between your toes.  Trim your toenails straight across. Do not dig under them or around the cuticle. File the edges of your nails with an emery board or nail file.  Do not cut corns or calluses or try to remove them with medicine.  Wear clean socks or stockings every day. Make sure they are not too tight. Do not wear knee-high stockings since they may decrease blood flow to your legs.  Wear shoes that fit properly and have enough cushioning. To break in new shoes, wear them for just a few hours a day. This prevents you from injuring your feet. Always look in your shoes before you put them on to be sure there are no objects inside.  Do not cross your legs. This may decrease the blood flow to your feet.  If you find a minor scrape,  cut, or break in the skin on your feet, keep it and the skin around it clean and dry. These areas may be cleansed with mild soap and water. Do not cleanse the area with peroxide, alcohol, or iodine.  When you remove an adhesive bandage, be sure not to damage the skin around it.  If you have a wound, look at it several times a day to make sure it is healing.  Do not use heating pads or hot water bottles. They may burn your skin. If you have lost feeling in your feet or legs, you may not know it is happening until it is too late.  Make sure your health care provider performs a complete foot exam at least annually or more often if you have foot problems. Report any cuts, sores, or bruises to your health care provider immediately. SEEK MEDICAL CARE IF:   You have an injury that is not healing.  You have cuts or breaks in the skin.  You have an ingrown nail.  You notice redness on your legs or feet.  You feel burning or tingling in your legs or feet.  You have pain or cramps in your legs and feet.  Your legs or feet are numb.  Your feet always feel cold. SEEK IMMEDIATE MEDICAL CARE IF:   There is increasing redness,   swelling, or pain in or around a wound.  There is a red line that goes up your leg.  Pus is coming from a wound.  You develop a fever or as directed by your health care provider.  You notice a bad smell coming from an ulcer or wound.   This information is not intended to replace advice given to you by your health care provider. Make sure you discuss any questions you have with your health care provider.   Document Released: 10/15/2000 Document Revised: 06/20/2013 Document Reviewed: 03/27/2013 Elsevier Interactive Patient Education 2016 Elsevier Inc.  

## 2016-09-08 NOTE — Addendum Note (Signed)
Addended by: [name] on: 09/08/2016 02:41 PM   Modules accepted: Orders

## 2016-09-08 NOTE — Progress Notes (Signed)
   Subjective:    Patient ID: [name], female    DOB: June 09, 1956, 60 y.o.   MRN: [id number]  HPI   I need my toenails trimmed up and I have a place on my right leg that the wound center is seeing me for and has been going on for about three months now     Review of Systems  All other systems reviewed and are negative.      Objective:   Physical Exam        Assessment & Plan:

## 2016-09-08 NOTE — Telephone Encounter (Signed)
Prescription printed and placed in front office for pickup. Please notify patient.

## 2016-09-08 NOTE — Progress Notes (Signed)
Patient ID: [name], female   DOB: 01/04/1956, 60 y.o.   MRN: [id number]   Subjective: This patient presents today complaining of uncomfortable toenails walking wearing shoes and requests toenail debridement. Patient states that she is currently under care for wound on her right lower extremity for the past 3 months on an ongoing and continuing basis  Objective: Orientated 3 DP and PT pulses 2/4 bilaterally Reflex immediate bilaterally Bilateral mild peripheral edema bilaterally Lower right leg has gauze dressing with drainage noted through bandage without malodor Sensation to 10 g monofilament wire intact 5/5 bilaterally Vibratory sensation reactive bilaterally Ankle reflex equal and reactive bilaterally Toenails are hypertrophic, elongated, deformed and tender to direct palpation 6-10 HAV bilaterally Manual motor testing dorsi flexion, plantar flexion, inversion, eversion 5/5 bilaterally  Assessment: Diabetic with satisfactory neurovascular status Lower extremity right wound under management wound care center Symptomatic onychomycoses 6-10  Plan: Debridement of toenails 6-10 mechanically an electrician without any bleeding  Reappoint 3 months

## 2016-09-09 NOTE — Telephone Encounter (Signed)
Left message on voicemail informing that rx was ready to be picked up. 

## 2016-09-14 ENCOUNTER — Other Ambulatory Visit: Payer: Self-pay | Admitting: *Deleted

## 2016-09-14 ENCOUNTER — Ambulatory Visit (INDEPENDENT_AMBULATORY_CARE_PROVIDER_SITE_OTHER): Payer: Commercial Managed Care - HMO | Admitting: Family Medicine

## 2016-09-14 ENCOUNTER — Telehealth: Payer: Self-pay | Admitting: Family Medicine

## 2016-09-14 ENCOUNTER — Encounter: Payer: Self-pay | Admitting: Family Medicine

## 2016-09-14 DIAGNOSIS — L03116 Cellulitis of left lower limb: Secondary | ICD-10-CM | POA: Insufficient documentation

## 2016-09-14 DIAGNOSIS — I83812 Varicose veins of left lower extremities with pain: Secondary | ICD-10-CM

## 2016-09-14 HISTORY — DX: Cellulitis of left lower limb: L03.116

## 2016-09-14 MED ORDER — SULFAMETHOXAZOLE-TRIMETHOPRIM 800-160 MG PO TABS: 1.0000 | ORAL_TABLET | Freq: Two times a day (BID) | ORAL | 0 refills | Status: DC

## 2016-09-14 NOTE — Progress Notes (Signed)
   CC: Left lateral leg sore and pain  HPI Called the clinic today, Dr. Ronal Fear he requested that she be add-on to my schedule with concerns for cellulitis left lower extremity sore and pain. Wound care has been working on R leg for 4 months, mostly healed. Graft is scheduled for right leg pending insurance. Today concerned with lateral L leg sore. Started 3 weeks ago, looked like a bump and itchy, then wound center was measuring it. Bump worsened and became a "hole." No temperatures at home, but feeling chills and shakiness. No creams to it, just washes with soap and water. Changes the dressing herself twice per day. Walking ok. Pain is throbbing, has tried norco prescribed by PCP and tylenol. Taking tylenol twice per day, norco two pills TID. Draining clear fluid, odor present, odor started 1 week ago.   CC, SH/smoking status, and VS noted  Objective: BP (!) 146/85 (BP Location: Left Arm, Patient Position: Sitting, Cuff Size: Normal)   Pulse 89   Temp 98.2 F (36.8 C) (Oral)   Wt 181 lb 6.4 oz (82.3 kg)   BMI 31.14 kg/m  Gen: NAD, alert, cooperative, and pleasant. HEENT: NCAT, EOMI, PERRL CV: RRR, no murmur Resp: CTAB, no wheezes, non-labored Abd: SNTND, BS present, no guarding or organomegaly Ext: No edema, warm. Lateral L lower extremity with quarter-sized open wound, exquisitely TTP, surrounding erythema. Mildly warm.    Neuro: Alert and oriented, Speech clear, No gross deficits  Assessment and plan:  Cellulitis of leg, left Began 3 weeks ago, about one week ago turned into a sore, and began draining serosanguineous fluid. Patient noticed odor and increasing pain 1 week. Site is erythematous, exquisitely tender to palpation, and mildly warm. Will treat for cellulitis with Bactrim double strength twice a day 10 days. Patient was instructed by PCP to keep her appointment for this Friday, counseled her on return precautions including worsening fever while on antibiotics extension of  redness, generally feeling unwell. Also counseled her that her Norco includes Tylenol and she should not exceed 4 g of Tylenol total per day.    Meds ordered this encounter  Medications  . sulfamethoxazole-trimethoprim (BACTRIM DS,SEPTRA DS) 800-160 MG tablet    Sig: Take 1 tablet by mouth 2 (two) times daily.    Dispense:  20 tablet    Refill:  0     Ralene Ok, MD, PGY1 09/14/2016 4:55 PM

## 2016-09-14 NOTE — Telephone Encounter (Signed)
Pt is in terrible pain with leg. Her pain medication isnt doing any good. She would like to talk to dr Ree Kida about this before her appt friday

## 2016-09-14 NOTE — Telephone Encounter (Signed)
Latina Craver RN scheduled with Dr. Lindell Noe at 3:45 today.

## 2016-09-14 NOTE — Telephone Encounter (Signed)
Patient reports new skin opening on left leg, associated redness and drainage, subjective fevers and chills, has not taken temperature, also taking Percocet 2 tablets every 4-6 hours prn pain.   I informed the patient that I would like her to be seen this afternoon for same day. Will forward to nursing staff to schedule appointment.

## 2016-09-14 NOTE — Telephone Encounter (Signed)
Spoke with patient, she states that she now has a sore on her left leg that has an odor to it and is draining. States wound care is following her for the wound on her right leg but states they wouldn't be able to treat new area without an order from her MD. Patient would like PCP to give her call to discuss.

## 2016-09-14 NOTE — Assessment & Plan Note (Signed)
Began 3 weeks ago, about one week ago turned into a sore, and began draining serosanguineous fluid. Patient noticed odor and increasing pain 1 week. Site is erythematous, exquisitely tender to palpation, and mildly warm. Will treat for cellulitis with Bactrim double strength twice a day 10 days. Patient was instructed by PCP to keep her appointment for this Friday, counseled her on return precautions including worsening fever while on antibiotics extension of redness, generally feeling unwell. Also counseled her that her Norco includes Tylenol and she should not exceed 4 g of Tylenol total per day.

## 2016-09-14 NOTE — Patient Instructions (Signed)
You have a bacterial infection overlying the sore on her leg. Be sure to take the antibiotic I prescribed twice per day every day for 10 days, it is really important that you finish the full course of antibiotics. As we discussed, go to the emergency room if you feel very sick, if the infection is spreading up her leg, or you have worsening fevers on antibiotics. Come back and see Dr. Ree Kida in 2 weeks to make sure this is improving.

## 2016-09-15 ENCOUNTER — Other Ambulatory Visit: Payer: Self-pay

## 2016-09-15 DIAGNOSIS — I10 Essential (primary) hypertension: Secondary | ICD-10-CM | POA: Diagnosis not present

## 2016-09-15 DIAGNOSIS — L97812 Non-pressure chronic ulcer of other part of right lower leg with fat layer exposed: Secondary | ICD-10-CM | POA: Diagnosis not present

## 2016-09-15 DIAGNOSIS — I87331 Chronic venous hypertension (idiopathic) with ulcer and inflammation of right lower extremity: Secondary | ICD-10-CM | POA: Diagnosis not present

## 2016-09-15 DIAGNOSIS — I890 Lymphedema, not elsewhere classified: Secondary | ICD-10-CM | POA: Diagnosis not present

## 2016-09-15 DIAGNOSIS — S81801A Unspecified open wound, right lower leg, initial encounter: Secondary | ICD-10-CM | POA: Diagnosis not present

## 2016-09-15 DIAGNOSIS — L97222 Non-pressure chronic ulcer of left calf with fat layer exposed: Secondary | ICD-10-CM | POA: Diagnosis not present

## 2016-09-15 DIAGNOSIS — I83012 Varicose veins of right lower extremity with ulcer of calf: Secondary | ICD-10-CM | POA: Diagnosis not present

## 2016-09-15 DIAGNOSIS — L97212 Non-pressure chronic ulcer of right calf with fat layer exposed: Secondary | ICD-10-CM | POA: Diagnosis not present

## 2016-09-15 NOTE — Patient Outreach (Signed)
[facility]) Care Management  [place]  09/15/2016   [name] 1956-07-25 [id number]  Subjective: Telephone call to patient for monthly call. Patient reports she is in pain right now as she went to the wound center today for right leg wound.  Patient reports that area is healing but now has a area to left leg. Discussed with patient circulation and possibly that what was going on with right leg could be the same as the left leg.  She states she thought that and will discuss with physician on Friday.  Patient reports she just took something for pain and is going to try to rest today. Patient reports that her sugar this morning was 102.  Discussed continued diet control in order to control sugars. She verbalized understanding.   Objective:   Encounter Medications:  Outpatient Encounter Prescriptions as of 09/15/2016  Medication Sig Note  . aspirin 81 MG tablet Take 81 mg by mouth daily.   Marland Kitchen atorvastatin (LIPITOR) 40 MG tablet TAKE ONE TABLET EACH DAY   . calcium carbonate (OS-CAL) 600 MG TABS tablet Take 1 tablet (600 mg total) by mouth 2 (two) times daily with a meal.   . cetirizine (ZYRTEC) 10 MG tablet Take 1 tablet (10 mg total) by mouth daily.   . Cholecalciferol (D3 MAXIMUM STRENGTH) 5000 units capsule Take 5,000 Units by mouth daily.   . diclofenac sodium (VOLTAREN) 1 % GEL Apply 2 g topically 4 (four) times daily.   Marland Kitchen docusate sodium (DOK) 100 MG capsule TAKE ONE CAPSULE TWICE A DAY AS NEEDED FOR CONSTIPATION   . esomeprazole (NEXIUM) 40 MG capsule Take 1 tab every morning.   . ferrous sulfate 220 (44 FE) MG/5ML solution Take 5 mLs (220 mg total) by mouth 2 (two) times daily with a meal.   . fluticasone (FLONASE) 50 MCG/ACT nasal spray Place 2 sprays into both nostrils at bedtime.   . furosemide (LASIX) 20 MG tablet TAKE TWO TABLETS EVERY DAY   . HYDROcodone-acetaminophen (NORCO) 5-325 MG tablet Take 1 tablet by mouth every 6 (six) hours as needed for  moderate pain.   Marland Kitchen lisinopril (PRINIVIL,ZESTRIL) 20 MG tablet Take 1 tablet (20 mg total) by mouth daily.   . Meth-Hyo-M Bl-Na Phos-Ph Sal (URIBEL) 118 MG CAPS Take 1 capsule (118 mg total) by mouth 2 (two) times daily as needed.   . Multiple Vitamin (MULTIVITAMIN) tablet Take 1 tablet by mouth daily.   . polyethylene glycol powder (GLYCOLAX/MIRALAX) powder TAKE 17G BY MOUTH TWICE DAILY   . sucralfate (CARAFATE) 1 GM/10ML suspension Take 10 mLs (1 g total) by mouth 4 (four) times daily -  with meals and at bedtime.   . sulfamethoxazole-trimethoprim (BACTRIM DS,SEPTRA DS) 800-160 MG tablet Take 1 tablet by mouth 2 (two) times daily.   . traMADol (ULTRAM) 50 MG tablet Take 1 tablet (50 mg total) by mouth every 8 (eight) hours as needed for moderate pain or severe pain.   . vitamin B-12 (CYANOCOBALAMIN) 1000 MCG tablet Take 1,000 mcg by mouth daily.   . vitamin C (ASCORBIC ACID) 250 MG tablet Take 250 mg by mouth daily.   . vitamin E 400 UNIT capsule Take 400 Units by mouth daily.   . Vitamins A & D 5000-400 units CAPS Take 5,000 Units by mouth daily. 05/13/2016: Received from: External Pharmacy Received Sig:   . zolpidem (AMBIEN) 10 MG tablet TAKE ONE TABLET AT BEDTIME AS NEEDED FOR SLEEP   . cyclobenzaprine (FLEXERIL) 10 MG  tablet Take 1 tablet (10 mg total) by mouth 3 (three) times daily as needed for muscle spasms. (Patient not taking: Reported on 09/15/2016) 03/08/2016: Has not filled prescription    No facility-administered encounter medications on file as of 09/15/2016.     Functional Status:  In your present state of health, do you have any difficulty performing the following activities: 02/23/2016 01/01/2016  Hearing? N N  Vision? N N  Difficulty concentrating or making decisions? N N  Walking or climbing stairs? N Y  Dressing or bathing? N N  Doing errands, shopping? N Y  Conservation officer, nature and eating ? N N  Using the Toilet? N N  In the past six months, have you accidently leaked urine? N N   Do you have problems with loss of bowel control? N Y  Managing your Medications? N N  Managing your Finances? N N  Housekeeping or managing your Housekeeping? N N  Some recent data might be hidden    Fall/Depression Screening: PHQ 2/9 Scores 09/14/2016 08/05/2016 06/08/2016 05/27/2016 05/19/2016 05/11/2016 04/27/2016  PHQ - 2 Score 0 0 0 0 0 0 0  PHQ- 9 Score 0 0 - - - - -    Assessment: Patient continues to benefit from health coach outreach for disease management and support.    Plan:  [facility] CM Care Plan Problem One   Flowsheet Row Most Recent Value  Care Plan Problem One  Diabetes education/reiforcement  Role Documenting the Problem One  Health [place] for Problem One  Active  [facility]N Long Term Goal (31-90 days)  Patient will maintain A1c less than 7 within 90 days.  THN Long Term Goal Start Date  09/15/16 [goal continued]  Interventions for Problem One Long Term Goal  RN Health Coach reiterated with patient A1c and goal of keeping less than 7.  [facility]N CM Short Term Goal #1 (0-30 days)  Patient will maintain diet of low carbhydrates within 30 days.  [facility]N CM Short Term Goal #1 Start Date  08/23/16  [facility] CM Short Term Goal #1 Met Date  09/15/16  Interventions for Short Term Goal #1  Patient controlling her diet  THN CM Short Term Goal #3 (0-30 days)  Patient will report  some improvement of wound to right leg within 30 days.    THN CM Short Term Goal #3 Start Date  09/15/16 [goal  continued]  Interventions for Short Tern Goal #3  [place] reiterated with patient things to promote wound healing, signs of infection, and importance of keeping blood sugar under control.      RN Health Coach will contact patient in the month of December and patient agrees to next outreach.  Jone Baseman, RN, MSN [place] [phone]

## 2016-09-16 ENCOUNTER — Ambulatory Visit: Payer: Commercial Managed Care - HMO | Admitting: Family Medicine

## 2016-09-16 DIAGNOSIS — S81801A Unspecified open wound, right lower leg, initial encounter: Secondary | ICD-10-CM | POA: Diagnosis not present

## 2016-09-16 NOTE — Progress Notes (Signed)
   Subjective:    Patient ID: [name], female    DOB: October 22, 1956, 60 y.o.   MRN: [id number]  HPI 60 y/o female presents for follow up of cellulitis left leg.  Evaluated on 11/14 by Dr. Lindell Noe, started on Bactrim DS. Pain, swelling, redness has significantly improved, no fevers, still having intermittent pain (mostly at night). Would like narcotic solution instead of pills. Currently seeing wound care for right leg wound, has upcoming appointment.   Social - nonsmoker   Review of Systems  Constitutional: Negative for chills and fever.       Objective:   Physical Exam BP 140/74   Pulse 88   Temp 98.4 F (36.9 C) (Oral)   Ht 5\' 4"  (1.626 m)   Wt 176 lb 6.4 oz (80 kg)   BMI 30.28 kg/m   Gen: pleasant female, NAD Ext: 1+ edema of bilateral LE Skin: RLE - 4.5 by 9 cm wound/denuded area; LLE - 3.5 by 2 cm wound/denuded area over lateral aspect, no surrounding erythema.       Assessment & Plan:  Cellulitis of leg, left Improving with Bactrim. Complete course of antibiotics and continue local wounds care.  -return precautions given -prescription for norco/tylenol solution given   Skin lesion of right lower extremity 4.5 by 9 cm wound of right LE. No evidence of cellulitis. Patient following with wound care.   HTN, goal below 140/90 Blood pressure at goal of less than 140/90. Intermittently elevated due to pain. -continue Lisinopril 20 mg daily

## 2016-09-17 ENCOUNTER — Ambulatory Visit (INDEPENDENT_AMBULATORY_CARE_PROVIDER_SITE_OTHER): Payer: Commercial Managed Care - HMO | Admitting: Family Medicine

## 2016-09-17 ENCOUNTER — Encounter: Payer: Self-pay | Admitting: Family Medicine

## 2016-09-17 DIAGNOSIS — L989 Disorder of the skin and subcutaneous tissue, unspecified: Secondary | ICD-10-CM

## 2016-09-17 DIAGNOSIS — I10 Essential (primary) hypertension: Secondary | ICD-10-CM | POA: Diagnosis not present

## 2016-09-17 DIAGNOSIS — L03116 Cellulitis of left lower limb: Secondary | ICD-10-CM | POA: Diagnosis not present

## 2016-09-17 MED ORDER — HYDROCODONE-ACETAMINOPHEN 5-217 MG/10ML PO SOLN: 10.0000 mL | Freq: Four times a day (QID) | ORAL | 0 refills | Status: DC | PRN

## 2016-09-17 NOTE — Patient Instructions (Signed)
It was nice to see you today.  Please complete the course of Bactrim.   I have given you a prescription for Hydrocodone and Tylenol solution.

## 2016-09-17 NOTE — Assessment & Plan Note (Signed)
Improving with Bactrim. Complete course of antibiotics and continue local wounds care.  -return precautions given -prescription for norco/tylenol solution given

## 2016-09-17 NOTE — Assessment & Plan Note (Signed)
4.5 by 9 cm wound of right LE. No evidence of cellulitis. Patient following with wound care.

## 2016-09-17 NOTE — Assessment & Plan Note (Signed)
Blood pressure at goal of less than 140/90. Intermittently elevated due to pain. -continue Lisinopril 20 mg daily

## 2016-09-21 DIAGNOSIS — I83012 Varicose veins of right lower extremity with ulcer of calf: Secondary | ICD-10-CM | POA: Diagnosis not present

## 2016-09-21 DIAGNOSIS — I890 Lymphedema, not elsewhere classified: Secondary | ICD-10-CM | POA: Diagnosis not present

## 2016-09-21 DIAGNOSIS — L97222 Non-pressure chronic ulcer of left calf with fat layer exposed: Secondary | ICD-10-CM | POA: Diagnosis not present

## 2016-09-21 DIAGNOSIS — I87331 Chronic venous hypertension (idiopathic) with ulcer and inflammation of right lower extremity: Secondary | ICD-10-CM | POA: Diagnosis not present

## 2016-09-21 DIAGNOSIS — L97812 Non-pressure chronic ulcer of other part of right lower leg with fat layer exposed: Secondary | ICD-10-CM | POA: Diagnosis not present

## 2016-09-21 DIAGNOSIS — L97212 Non-pressure chronic ulcer of right calf with fat layer exposed: Secondary | ICD-10-CM | POA: Diagnosis not present

## 2016-09-21 DIAGNOSIS — I10 Essential (primary) hypertension: Secondary | ICD-10-CM | POA: Diagnosis not present

## 2016-09-22 ENCOUNTER — Other Ambulatory Visit: Payer: Self-pay | Admitting: Family Medicine

## 2016-09-22 ENCOUNTER — Other Ambulatory Visit: Payer: Self-pay | Admitting: *Deleted

## 2016-09-23 ENCOUNTER — Encounter (HOSPITAL_COMMUNITY): Payer: Self-pay

## 2016-09-23 DIAGNOSIS — K5900 Constipation, unspecified: Secondary | ICD-10-CM | POA: Insufficient documentation

## 2016-09-23 DIAGNOSIS — I10 Essential (primary) hypertension: Secondary | ICD-10-CM | POA: Insufficient documentation

## 2016-09-23 DIAGNOSIS — E11622 Type 2 diabetes mellitus with other skin ulcer: Secondary | ICD-10-CM | POA: Insufficient documentation

## 2016-09-23 DIAGNOSIS — N281 Cyst of kidney, acquired: Secondary | ICD-10-CM | POA: Insufficient documentation

## 2016-09-23 DIAGNOSIS — E669 Obesity, unspecified: Secondary | ICD-10-CM | POA: Insufficient documentation

## 2016-09-23 DIAGNOSIS — K644 Residual hemorrhoidal skin tags: Secondary | ICD-10-CM | POA: Diagnosis not present

## 2016-09-23 DIAGNOSIS — E7800 Pure hypercholesterolemia, unspecified: Secondary | ICD-10-CM | POA: Diagnosis not present

## 2016-09-23 DIAGNOSIS — G4700 Insomnia, unspecified: Secondary | ICD-10-CM | POA: Diagnosis not present

## 2016-09-23 DIAGNOSIS — M5136 Other intervertebral disc degeneration, lumbar region: Secondary | ICD-10-CM | POA: Insufficient documentation

## 2016-09-23 DIAGNOSIS — Z90710 Acquired absence of both cervix and uterus: Secondary | ICD-10-CM | POA: Diagnosis not present

## 2016-09-23 DIAGNOSIS — Z8673 Personal history of transient ischemic attack (TIA), and cerebral infarction without residual deficits: Secondary | ICD-10-CM | POA: Insufficient documentation

## 2016-09-23 DIAGNOSIS — I491 Atrial premature depolarization: Secondary | ICD-10-CM | POA: Diagnosis not present

## 2016-09-23 DIAGNOSIS — L309 Dermatitis, unspecified: Secondary | ICD-10-CM | POA: Insufficient documentation

## 2016-09-23 DIAGNOSIS — Z96653 Presence of artificial knee joint, bilateral: Secondary | ICD-10-CM | POA: Insufficient documentation

## 2016-09-23 DIAGNOSIS — Z9049 Acquired absence of other specified parts of digestive tract: Secondary | ICD-10-CM | POA: Insufficient documentation

## 2016-09-23 DIAGNOSIS — J45991 Cough variant asthma: Secondary | ICD-10-CM | POA: Diagnosis not present

## 2016-09-23 DIAGNOSIS — Z800 Family history of malignant neoplasm of digestive organs: Secondary | ICD-10-CM | POA: Insufficient documentation

## 2016-09-23 DIAGNOSIS — I872 Venous insufficiency (chronic) (peripheral): Secondary | ICD-10-CM | POA: Insufficient documentation

## 2016-09-23 DIAGNOSIS — Z791 Long term (current) use of non-steroidal anti-inflammatories (NSAID): Secondary | ICD-10-CM | POA: Insufficient documentation

## 2016-09-23 DIAGNOSIS — E440 Moderate protein-calorie malnutrition: Secondary | ICD-10-CM | POA: Diagnosis not present

## 2016-09-23 DIAGNOSIS — L97229 Non-pressure chronic ulcer of left calf with unspecified severity: Secondary | ICD-10-CM | POA: Diagnosis not present

## 2016-09-23 DIAGNOSIS — Z7982 Long term (current) use of aspirin: Secondary | ICD-10-CM | POA: Insufficient documentation

## 2016-09-23 DIAGNOSIS — Z7951 Long term (current) use of inhaled steroids: Secondary | ICD-10-CM | POA: Insufficient documentation

## 2016-09-23 DIAGNOSIS — Z833 Family history of diabetes mellitus: Secondary | ICD-10-CM | POA: Insufficient documentation

## 2016-09-23 DIAGNOSIS — Z79899 Other long term (current) drug therapy: Secondary | ICD-10-CM | POA: Insufficient documentation

## 2016-09-23 DIAGNOSIS — N179 Acute kidney failure, unspecified: Secondary | ICD-10-CM | POA: Diagnosis not present

## 2016-09-23 DIAGNOSIS — K219 Gastro-esophageal reflux disease without esophagitis: Secondary | ICD-10-CM | POA: Diagnosis not present

## 2016-09-23 DIAGNOSIS — D509 Iron deficiency anemia, unspecified: Secondary | ICD-10-CM | POA: Diagnosis not present

## 2016-09-23 DIAGNOSIS — Z886 Allergy status to analgesic agent status: Secondary | ICD-10-CM | POA: Insufficient documentation

## 2016-09-23 DIAGNOSIS — R109 Unspecified abdominal pain: Secondary | ICD-10-CM | POA: Diagnosis not present

## 2016-09-23 DIAGNOSIS — R112 Nausea with vomiting, unspecified: Secondary | ICD-10-CM | POA: Diagnosis not present

## 2016-09-23 DIAGNOSIS — L97219 Non-pressure chronic ulcer of right calf with unspecified severity: Secondary | ICD-10-CM | POA: Diagnosis not present

## 2016-09-23 DIAGNOSIS — Z9889 Other specified postprocedural states: Secondary | ICD-10-CM | POA: Insufficient documentation

## 2016-09-23 DIAGNOSIS — Z9884 Bariatric surgery status: Secondary | ICD-10-CM | POA: Insufficient documentation

## 2016-09-23 DIAGNOSIS — Z8249 Family history of ischemic heart disease and other diseases of the circulatory system: Secondary | ICD-10-CM | POA: Insufficient documentation

## 2016-09-23 DIAGNOSIS — Z823 Family history of stroke: Secondary | ICD-10-CM | POA: Insufficient documentation

## 2016-09-23 DIAGNOSIS — L03116 Cellulitis of left lower limb: Secondary | ICD-10-CM | POA: Diagnosis not present

## 2016-09-23 LAB — URINALYSIS, ROUTINE W REFLEX MICROSCOPIC
Bilirubin Urine: NEGATIVE
Glucose, UA: NEGATIVE mg/dL
Hgb urine dipstick: NEGATIVE
Ketones, ur: NEGATIVE mg/dL
Leukocytes, UA: NEGATIVE
Nitrite: NEGATIVE
Protein, ur: 30 mg/dL — AB
Specific Gravity, Urine: 1.029 (ref 1.005–1.030)
pH: 5.5 (ref 5.0–8.0)

## 2016-09-23 LAB — COMPREHENSIVE METABOLIC PANEL
ALT: 25 U/L (ref 14–54)
AST: 34 U/L (ref 15–41)
Albumin: 3.8 g/dL (ref 3.5–5.0)
Alkaline Phosphatase: 71 U/L (ref 38–126)
Anion gap: 11 (ref 5–15)
BUN: 23 mg/dL — ABNORMAL HIGH (ref 6–20)
CO2: 23 mmol/L (ref 22–32)
Calcium: 9.6 mg/dL (ref 8.9–10.3)
Chloride: 104 mmol/L (ref 101–111)
Creatinine, Ser: 2.03 mg/dL — ABNORMAL HIGH (ref 0.44–1.00)
GFR calc Af Amer: 30 mL/min — ABNORMAL LOW (ref >60)
GFR calc non Af Amer: 25 mL/min — ABNORMAL LOW (ref >60)
Glucose, Bld: 99 mg/dL (ref 65–99)
Potassium: 3.8 mmol/L (ref 3.5–5.1)
Sodium: 138 mmol/L (ref 135–145)
Total Bilirubin: <0.1 mg/dL — ABNORMAL LOW (ref 0.3–1.2)
Total Protein: 7.5 g/dL (ref 6.5–8.1)

## 2016-09-23 LAB — URINE MICROSCOPIC-ADD ON

## 2016-09-23 LAB — CBC
HCT: 31.1 % — ABNORMAL LOW (ref 36.0–46.0)
Hemoglobin: 10 g/dL — ABNORMAL LOW (ref 12.0–15.0)
MCH: 26.2 pg (ref 26.0–34.0)
MCHC: 32.2 g/dL (ref 30.0–36.0)
MCV: 81.6 fL (ref 78.0–100.0)
Platelets: 296 10*3/uL (ref 150–400)
RBC: 3.81 MIL/uL — ABNORMAL LOW (ref 3.87–5.11)
RDW: 17.3 % — ABNORMAL HIGH (ref 11.5–15.5)
WBC: 5.6 10*3/uL (ref 4.0–10.5)

## 2016-09-23 LAB — LIPASE, BLOOD: Lipase: 19 U/L (ref 11–51)

## 2016-09-23 MED ORDER — ONDANSETRON 4 MG PO TBDP
ORAL_TABLET | ORAL | Status: AC
  Filled 2016-09-23: qty 1

## 2016-09-23 MED ORDER — ONDANSETRON 4 MG PO TBDP
4.0000 mg | ORAL_TABLET | Freq: Once | ORAL | Status: AC | PRN
  Administered 2016-09-23: 4 mg via ORAL

## 2016-09-23 NOTE — ED Triage Notes (Signed)
Pt states that she has not slept in two days due to pain and swelling in her legs due to wounds, pt is seen at a wound clinic for these. Dressing clean and dry. Pt is requesting pain medication, states she has vomited 10 times today also, denies abd pain.

## 2016-09-24 ENCOUNTER — Emergency Department (HOSPITAL_COMMUNITY): Payer: Commercial Managed Care - HMO

## 2016-09-24 ENCOUNTER — Other Ambulatory Visit: Payer: Self-pay

## 2016-09-24 ENCOUNTER — Observation Stay (HOSPITAL_COMMUNITY)
Admission: EM | Admit: 2016-09-24 | Discharge: 2016-09-25 | Disposition: A | Discharge status: Home / Self Care | Payer: Commercial Managed Care - HMO | Attending: Family Medicine | Admitting: Family Medicine

## 2016-09-24 DIAGNOSIS — N179 Acute kidney failure, unspecified: Secondary | ICD-10-CM

## 2016-09-24 DIAGNOSIS — L03116 Cellulitis of left lower limb: Secondary | ICD-10-CM

## 2016-09-24 DIAGNOSIS — E440 Moderate protein-calorie malnutrition: Secondary | ICD-10-CM | POA: Diagnosis not present

## 2016-09-24 DIAGNOSIS — R109 Unspecified abdominal pain: Secondary | ICD-10-CM | POA: Diagnosis not present

## 2016-09-24 DIAGNOSIS — R112 Nausea with vomiting, unspecified: Secondary | ICD-10-CM

## 2016-09-24 DIAGNOSIS — R1110 Vomiting, unspecified: Secondary | ICD-10-CM

## 2016-09-24 HISTORY — DX: Vomiting, unspecified: R11.10

## 2016-09-24 HISTORY — DX: Acute kidney failure, unspecified: N17.9

## 2016-09-24 LAB — TROPONIN I
Troponin I: <0.03 ng/mL (ref <0.03)
Troponin I: <0.03 ng/mL (ref <0.03)
Troponin I: <0.03 ng/mL (ref <0.03)

## 2016-09-24 LAB — CBC
HCT: 29.8 % — ABNORMAL LOW (ref 36.0–46.0)
Hemoglobin: 9.5 g/dL — ABNORMAL LOW (ref 12.0–15.0)
MCH: 26.2 pg (ref 26.0–34.0)
MCHC: 31.9 g/dL (ref 30.0–36.0)
MCV: 82.1 fL (ref 78.0–100.0)
Platelets: 256 10*3/uL (ref 150–400)
RBC: 3.63 MIL/uL — ABNORMAL LOW (ref 3.87–5.11)
RDW: 17.4 % — ABNORMAL HIGH (ref 11.5–15.5)
WBC: 5.8 10*3/uL (ref 4.0–10.5)

## 2016-09-24 LAB — I-STAT CG4 LACTIC ACID, ED
Lactic Acid, Venous: 1.11 mmol/L (ref 0.5–1.9)
Lactic Acid, Venous: 0.92 mmol/L (ref 0.5–1.9)

## 2016-09-24 LAB — BASIC METABOLIC PANEL
Anion gap: 11 (ref 5–15)
BUN: 19 mg/dL (ref 6–20)
CO2: 23 mmol/L (ref 22–32)
Calcium: 8.7 mg/dL — ABNORMAL LOW (ref 8.9–10.3)
Chloride: 103 mmol/L (ref 101–111)
Creatinine, Ser: 1.25 mg/dL — ABNORMAL HIGH (ref 0.44–1.00)
GFR calc Af Amer: 53 mL/min — ABNORMAL LOW (ref >60)
GFR calc non Af Amer: 46 mL/min — ABNORMAL LOW (ref >60)
Glucose, Bld: 102 mg/dL — ABNORMAL HIGH (ref 65–99)
Potassium: 3.5 mmol/L (ref 3.5–5.1)
Sodium: 137 mmol/L (ref 135–145)

## 2016-09-24 LAB — GLUCOSE, CAPILLARY
Glucose-Capillary: 120 mg/dL — ABNORMAL HIGH (ref 65–99)
Glucose-Capillary: 113 mg/dL — ABNORMAL HIGH (ref 65–99)
Glucose-Capillary: 95 mg/dL (ref 65–99)

## 2016-09-24 MED ORDER — FENTANYL CITRATE (PF) 100 MCG/2ML IJ SOLN
50.0000 ug | Freq: Once | INTRAMUSCULAR | Status: AC
  Administered 2016-09-24: 50 ug via INTRAVENOUS
  Filled 2016-09-24: qty 2

## 2016-09-24 MED ORDER — ENOXAPARIN SODIUM 30 MG/0.3ML ~~LOC~~ SOLN
30.0000 mg | Freq: Every day | SUBCUTANEOUS | Status: DC
  Administered 2016-09-24 – 2016-09-25 (×2): 30 mg via SUBCUTANEOUS
  Filled 2016-09-24 (×2): qty 0.3

## 2016-09-24 MED ORDER — SODIUM CHLORIDE 0.9 % IV SOLN
INTRAVENOUS | Status: AC
  Administered 2016-09-24: 05:00:00 via INTRAVENOUS

## 2016-09-24 MED ORDER — PANTOPRAZOLE SODIUM 40 MG PO TBEC
40.0000 mg | DELAYED_RELEASE_TABLET | Freq: Every day | ORAL | Status: DC
Start: 2016-09-24 — End: 2016-09-25
  Administered 2016-09-24 – 2016-09-25 (×2): 40 mg via ORAL
  Filled 2016-09-24 (×2): qty 1

## 2016-09-24 MED ORDER — ONDANSETRON HCL 4 MG/2ML IJ SOLN
4.0000 mg | Freq: Four times a day (QID) | INTRAMUSCULAR | Status: DC | PRN
  Administered 2016-09-24: 4 mg via INTRAVENOUS
  Filled 2016-09-24: qty 2

## 2016-09-24 MED ORDER — SODIUM CHLORIDE 0.9 % IV SOLN
INTRAVENOUS | Status: DC
  Administered 2016-09-24: 17:00:00 via INTRAVENOUS

## 2016-09-24 MED ORDER — ONDANSETRON HCL 4 MG PO TABS: 4.0000 mg | ORAL_TABLET | Freq: Four times a day (QID) | ORAL | Status: DC | PRN

## 2016-09-24 MED ORDER — ATORVASTATIN CALCIUM 40 MG PO TABS
40.0000 mg | ORAL_TABLET | Freq: Every day | ORAL | Status: DC
  Administered 2016-09-24: 40 mg via ORAL
  Filled 2016-09-24: qty 1

## 2016-09-24 MED ORDER — SODIUM CHLORIDE 0.9 % IV BOLUS (SEPSIS)
1000.0000 mL | Freq: Once | INTRAVENOUS | Status: AC
  Administered 2016-09-24: 1000 mL via INTRAVENOUS

## 2016-09-24 MED ORDER — POLYETHYLENE GLYCOL 3350 17 G PO PACK: 17.0000 g | PACK | Freq: Every day | ORAL | Status: DC | PRN

## 2016-09-24 MED ORDER — AMLODIPINE BESYLATE 5 MG PO TABS
5.0000 mg | ORAL_TABLET | Freq: Every day | ORAL | Status: DC
  Administered 2016-09-24 – 2016-09-25 (×2): 5 mg via ORAL
  Filled 2016-09-24 (×2): qty 1

## 2016-09-24 MED ORDER — ACETAMINOPHEN 650 MG RE SUPP: 650.0000 mg | Freq: Four times a day (QID) | RECTAL | Status: DC | PRN

## 2016-09-24 MED ORDER — ACETAMINOPHEN 325 MG PO TABS
650.0000 mg | ORAL_TABLET | Freq: Four times a day (QID) | ORAL | Status: DC | PRN
  Administered 2016-09-24 – 2016-09-25 (×3): 650 mg via ORAL
  Filled 2016-09-24 (×3): qty 2

## 2016-09-24 MED ORDER — TRAMADOL HCL 50 MG PO TABS
50.0000 mg | ORAL_TABLET | Freq: Three times a day (TID) | ORAL | Status: DC | PRN
  Administered 2016-09-24 – 2016-09-25 (×4): 50 mg via ORAL
  Filled 2016-09-24 (×4): qty 1

## 2016-09-24 MED ORDER — COLLAGENASE 250 UNIT/GM EX OINT
TOPICAL_OINTMENT | Freq: Every day | CUTANEOUS | Status: DC
  Administered 2016-09-24: 16:00:00 via TOPICAL
  Filled 2016-09-24: qty 30

## 2016-09-24 MED ORDER — SODIUM CHLORIDE 0.9 % IV SOLN: INTRAVENOUS | Status: DC

## 2016-09-24 MED ORDER — INFLUENZA VAC SPLIT QUAD 0.5 ML IM SUSY
0.5000 mL | PREFILLED_SYRINGE | INTRAMUSCULAR | Status: AC
  Administered 2016-09-25: 0.5 mL via INTRAMUSCULAR
  Filled 2016-09-24: qty 0.5

## 2016-09-24 MED ORDER — ONDANSETRON HCL 4 MG/2ML IJ SOLN
4.0000 mg | Freq: Once | INTRAMUSCULAR | Status: AC
  Administered 2016-09-24: 4 mg via INTRAVENOUS
  Filled 2016-09-24: qty 2

## 2016-09-24 NOTE — Patient Outreach (Signed)
  [facility]) Care Management  09/24/2016  [name] October 20, 1956 [id number]   Patient admitted to the [name] made aware of admit.  Will send discipline closure letter to physician.  Jone Baseman, RN, MSN [place] [phone]

## 2016-09-24 NOTE — ED Notes (Signed)
[place] provided to patient for fluid challenge.

## 2016-09-24 NOTE — Care Management Obs Status (Signed)
[place] NOTIFICATION   Patient Details  Name: JANA GLASNER MRN: [id number] Date of Birth: May 24, 1956   Medicare Observation Status Notification Given:  Yes    Quirino Kakos, Rory Percy, RN 09/24/2016, 2:45 PM

## 2016-09-24 NOTE — Consult Note (Addendum)
[facility] Nurse wound consult note Reason for Consult: Consult requested for bilat leg wounds.  Pt states she is followed by an outpatient wound care center and uses Santyl to the left leg and her physician performs serial debridements.  She uses a Guevara absorbant dressing to the right leg but is unable to states the name of the product.  Wound type: Left outer leg with chronic full thickness wound; 9X5X.2cm, 90% eschar, 10% red, mod amt yellow drainage, no odor. Right outer leg with chronic full thickness wound; 4.5X2.5X.2cm, 10% eschar, 90% red, mod amt yellow  drainage, no odor Dressing procedure/placement/frequency: Continue present plan of care with Santyl for daily enzymatic debridement of nonviable tissue to left leg, and will use Aquacel to absorb drainage and provide antibiotic benefits to right leg wound, every other day as patient states was the routine.  She can resume follow-up with the outpatient wound clinic after discharge and denies further questions. Please re-consult if further assistance is needed.  Thank-you,  Julien Girt MSN, [place], [place], [place], [place]

## 2016-09-24 NOTE — ED Notes (Signed)
Hospitalists at bedside at this time.  

## 2016-09-24 NOTE — H&P (Signed)
[facility] Admission History and Physical Service Pager: [phone]  [name] Medical record number: [id number] Date of birth: 10/26/1956 Age: 60 y.o. Gender: female  Primary Care Provider: [name], MD Consultants: None Code Status: Full, confirmed this admission  Chief Complaint: Intractable vomiting  Assessment and Plan: [name] is a 60 y.o. female presenting with emesis and abdominal pain x1d. PMH is significant for T2DM, HTN, hx CVA, s/p gastric bypass, iron deficiency anemia, diverticulosis, Asthma, GERD, chronic LE wounds  Nausea/Emesis - likely secondary to viral gastroenteritis given brief course and  multiple sick contacts at home. Less likely on the differential, considered small bowel obstruction given history of abdominal surgeries (roux-en-y gastric bypass in 2013, partial hysterectomy ~1987), and patient endorses no bowel movement x24h, though has been passing gas and abdominal XR with non-obstructive bowel gas pattern. ACS was considered given that the patient is a previous diabetic and may present with nontypical symptoms, she does have a history of CVA (~ remote lacunar infarct on CT in 2006).  EKG NSR with no ST-T elevations or depressions. No chest pain, no exertional dyspnea. Will follow troponins x3. Also considered sepsis secondary to chronic leg wounds, however this is ruled out with 0/4 SIRS criteria on admission, normal lactic acid (0.92), and leg wounds without evidence of infection (Right calf stage 2 ulcer, clean and dry, Left calf stage 3 ulcer, draining serous fluid, no erythema warmth or edema).  Blood cultures were sent.  - Admit as obs to [facility]PT[facility] Dr. Ardelia Mems - follow up blood cultures - trend troponin - Strict I&O - IVNS at 150 ml/hr - Nausea control with PRN Zofran  - cardiac diet as tolerated  AKI: Cr 2.0 on admission from baseline 0.6-0.8, likely prerenal in the setting of emesis and inability to  keep anything down for the past several days.  - IVF as above - monitor AM BMP  Chronic LE wounds, bilateral: Chronic, >4 months. Right lateral calf, Stage 2 ulcer, clean and dry. Left lateral calf with stage 3, minimally draining serous fluid, no erythema warmth or edema. No exudative drainage. No concerns for infection at this time. Patient was started on bactrim on 11/14 for cellulitis associated with her left calf ulcer for a ten day course (day of admission would be last day of the course). Patient followed by wound care as an outpatient, endorses plan for skin graft for R calf wound. - discontinue bactrim given completed course, resolved cellulitis, and in setting of AKI - wound care consult - continue to monitor  HTN - Blood pressure elevated at 152/91 on admission. Home regimen lisinopril 20 mg qd, HCTZ 12.5 mg qd.  - hold Ace and HCTZ given AKI - monitor BP - if BP elevates consider norvasc or hydralazine in this acute setting for better BP control  Type 2 Diabetes Mellitus, resolved - Last A1c 5.6 in 03/2016. Patient is s/p gastric bypass surgery, and has required no diabetes medications since gastric bypass (~14yrs ago) - monitor CBG's qAM  Anemia, Iron deficiency, chronic - Hgb stable at 10.0 on admission (previous recent Hgb 9.6-10.4). Anemia profile in 07/2015 demonstrated Iron low at 10, TIBC elevated at 459, ferritin 12 consistent with iron deficiency anemia. Patient chronically on vitamin B12 and ferrous sulfate at home. - monitor Hgb - restart vitamins at discharge  Hx CVA - home medication lipitor 40 mg qd, last lipid panel 03/2016 Cholesterol 141, Triglycerides 88, HDL 104, LDL 19. - continue  home statin  FEN/GI: IVNS at 150 ml/hr, cardiac diet  Prophylaxis: lovenox  Disposition: Home pending ability to tolerate PO fluids  History of Present Illness:  [name] is a 60 y.o. female presenting with intractable nausea vomiting. The patient was in her usual state of  health until 1 week ago when she noticed intermittent emesis, last kept a meal down without vomiting 4 days ago.  Over the last 24 hours her symptoms worsened with increased episodes of emesis/dry heaving and inability to keep anything down.  She endorses abdominal pain associated with the emesis.  Patient took her temperature at home yesterday and notes fever to 101F.  She endorses multiple sick contacts at home and thought she had a "24 hour virus."  She denies diarrhea, notes constipation with last BM Wednesday, does endorse flatulence. Notes dyspnea secondary to abdominal pain with vomiting, no dyspnea on exertion, no chest pain, no urinary frequency or urgency, no dizziness.  Patient does have history of chronic leg wounds >4 months with increased pain on right calf wound over last 24 hours, and notes the wound has had clear drainage.   In the ED the patient was found to have Cr elevated at 2.03 (baseline 0.6-0.8).  She was brought in to be observed overnight for rehydration and symptom control given AKI.   Review Of Systems: Per HPI with the following additions:   Review of Systems  Constitutional: Positive for fever. Negative for chills.  Respiratory: Negative for shortness of breath.   Cardiovascular: Negative for chest pain and palpitations.  Gastrointestinal: Positive for abdominal pain, constipation, nausea and vomiting. Negative for blood in stool, diarrhea, heartburn and melena.  Genitourinary: Negative for dysuria, flank pain, frequency, hematuria and urgency.  Neurological: Negative for dizziness.    Patient Active Problem List   Diagnosis Date Noted  . Cellulitis of leg, left 09/14/2016  . Healthcare maintenance 08/05/2016  . Skin lesion of right lower extremity 05/27/2016  . DDD (degenerative disc disease), lumbar 04/14/2016  . Cervical pain (neck) 03/26/2016  . Lumbar back pain 03/26/2016  . Osteoarthritis of both knees 03/12/2016  . Cramping of hands 02/27/2016  .  Hypoalbuminemia 01/30/2016  . Superficial thrombophlebitis 01/29/2016  . Leg swelling 01/29/2016  . Vision changes 12/30/2015  . S/P laparoscopic colectomy 12/25/2015  . Premature supraventricular beats 12/25/2015  . Tension type headache 05/26/2015  . Muscle pain 05/26/2015  . Chest pain 05/12/2015  . Diverticulosis of colon without hemorrhage 11/18/2014  . Pain in joint, shoulder region 07/25/2014  . Muscle cramps 04/22/2014  . Biliary sludge 04/08/2014  . Constipation 10/19/2013  . Subacromial bursitis 05/21/2013  . Eczema 05/21/2013  . External hemorrhoids without complication [id number]  . Insomnia 08/29/2012  . GERD (gastroesophageal reflux disease) 08/24/2012  . S/P gastric bypass 06/07/2012  . Dysuria 03/20/2012  . Asthma, cough variant 03/10/2012  . Diverticulosis of colon with hemorrhage   . Hypopigmented skin lesion 02/23/2011  . Allergic rhinitis 05/27/2010  . POSTMENOPAUSAL STATUS 04/07/2010  . LIBIDO, DECREASED 10/16/2009  . H/O type 2 diabetes mellitus 01/25/2008  . CYST, KIDNEY, ACQUIRED 08/15/2007  . CEREBROVASCULAR ACCIDENT, HX OF 07/26/2007  . HTN, goal below 140/90 05/12/2007  . OBESITY, NOS 12/29/2006  . Iron deficiency anemia 12/29/2006  . Venous (peripheral) insufficiency 12/29/2006    Past Medical History: Past Medical History:  Diagnosis Date  . Anemia   . Arthritis    knees  . Back pain 10/04/2012  . Blood in stool 10/07/2014  . Blood  transfusion without reported diagnosis   . Bunion 03/28/2014  . 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 Degenerative arthritis of right shoulder region 08/2013  . Diverticulosis   . GERD (gastroesophageal reflux disease)   . High cholesterol   . History of GI bleed   . Hypertension    under control with meds., has been on med. > 20 yr.  . Lower GI bleed   . Medial meniscus tear 1997   Right Knee  . Mini stroke ([place])   . Rotator cuff rupture, complete  08/2013   right  . Shoulder impingement 08/2013   right  . Stroke [facility]) 2006   Remote left lacunar infarct noted on CT head 2006, 2010   . Ulcer ([place])   . Wears dentures    upper  . Wears partial dentures    lower    Past Surgical History: Past Surgical History:  Procedure Laterality Date  . ABDOMINAL HYSTERECTOMY  ?1987   partial  . COLONOSCOPY  05/02/2000; 05/08/2013  . COSMETIC SURGERY  02/22/2014   skin removal surgery from lower abdomen   . ENDOVENOUS ABLATION SAPHENOUS VEIN W/ LASER Right 08/12/2016   endovenous laser ablation right greater saphenous vein by Curt Jews MD   . METATARSAL OSTEOTOMY Right 1995   Right Bunion Repair  . 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 SHOULDER ARTHROSCOPY WITH ROTATOR CUFF REPAIR AND SUBACROMIAL DECOMPRESSION Right 08/09/2013   Procedure: RIGHT SHOULDER ARTHROSCOPY WITH SUBACROMIAL DECOMPRESSION, DISTAL CLAVICLE EXCISION AND ROTATOR CUFF REPAIR and release biceps;  Surgeon: Ninetta Lights, MD;  Location: [place];  Service: Orthopedics;  Laterality: Right;  . SUBTOTAL COLECTOMY  11/24/15   [place] Left   . TOTAL KNEE ARTHROPLASTY Right     Social History: Social History  Substance Use Topics  . Smoking status: Never Smoker  . Smokeless tobacco: Never Used  . Alcohol use 0.0 oz/week     Comment: occasional wine   Additional social history:  Please also refer to relevant sections of EMR.  Family History: Family History  Problem Relation Age of Onset  . Cancer Father 6    Died from complications of colon CA  . Colon cancer Father     died in his 22s  . Diabetes Mother   . Hypertension Mother   . Diabetes Sister   . Heart disease Sister   . Diabetes Sister   . Stroke Sister   . Diabetes Brother   . Stroke Brother   . Diabetes Brother   . Diabetes Brother   . Diabetes Brother     Allergies and Medications: Allergies  Allergen Reactions  . Aspirin Other (See Comments)     CAN NOT TAKE DUE TO GI BLEED GI BLEED  . Nsaids Other (See Comments)    GI BLEED  . Tolmetin Other (See Comments)    GI BLEED   No current facility-administered medications on file prior to encounter.    Current Outpatient Prescriptions on File Prior to Encounter  Medication Sig Dispense Refill  . aspirin 81 MG tablet Take 81 mg by mouth daily.    Marland Kitchen atorvastatin (LIPITOR) 40 MG tablet TAKE ONE TABLET EACH DAY 90 tablet 1  . calcium carbonate (OS-CAL) 600 MG TABS tablet Take 1 tablet (600 mg total) by mouth 2 (two) times daily with a meal. 180 tablet 1  . cetirizine (ZYRTEC) 10 MG  tablet Take 1 tablet (10 mg total) by mouth daily. 30 tablet 11  . Cholecalciferol (D3 MAXIMUM STRENGTH) 5000 units capsule Take 5,000 Units by mouth daily.    . cyclobenzaprine (FLEXERIL) 10 MG tablet Take 1 tablet (10 mg total) by mouth 3 (three) times daily as needed for muscle spasms. (Patient not taking: Reported on 09/15/2016) 8 tablet 0  . diclofenac sodium (VOLTAREN) 1 % GEL Apply 2 g topically 4 (four) times daily. 100 g 0  . docusate sodium (DOK) 100 MG capsule TAKE ONE CAPSULE TWICE A DAY AS NEEDED FOR CONSTIPATION 180 capsule 1  . esomeprazole (NEXIUM) 40 MG capsule Take 1 tab every morning. 30 capsule 1  . ferrous sulfate 220 (44 FE) MG/5ML solution Take 5 mLs (220 mg total) by mouth 2 (two) times daily with a meal. 473 mL 2  . fluticasone (FLONASE) 50 MCG/ACT nasal spray Place 2 sprays into both nostrils at bedtime. 16 g 6  . furosemide (LASIX) 20 MG tablet TAKE TWO TABLETS EVERY DAY 60 tablet 2  . hydrochlorothiazide (MICROZIDE) 12.5 MG capsule TAKE ONE CAPSULE EACH DAY 90 capsule 1  . Hydrocodone-Acetaminophen 5-217 MG/10ML SOLN Take 10 mLs by mouth every 6 (six) hours as needed. 473 mL 0  . lisinopril (PRINIVIL,ZESTRIL) 20 MG tablet Take 1 tablet (20 mg total) by mouth daily. 90 tablet 1  . Meth-Hyo-M Bl-Na Phos-Ph Sal (URIBEL) 118 MG CAPS Take 1 capsule (118 mg total) by mouth 2 (two) times  daily as needed. 120 capsule 0  . Multiple Vitamin (MULTIVITAMIN) tablet Take 1 tablet by mouth daily. 90 tablet 1  . polyethylene glycol powder (GLYCOLAX/MIRALAX) powder TAKE 17G BY MOUTH TWICE DAILY 850 g 2  . sucralfate (CARAFATE) 1 GM/10ML suspension Take 10 mLs (1 g total) by mouth 4 (four) times daily -  with meals and at bedtime. 420 mL 1  . sulfamethoxazole-trimethoprim (BACTRIM DS,SEPTRA DS) 800-160 MG tablet Take 1 tablet by mouth 2 (two) times daily. 20 tablet 0  . traMADol (ULTRAM) 50 MG tablet Take 1 tablet (50 mg total) by mouth every 8 (eight) hours as needed for moderate pain or severe pain. 60 tablet 1  . vitamin B-12 (CYANOCOBALAMIN) 1000 MCG tablet Take 1,000 mcg by mouth daily.    . vitamin C (ASCORBIC ACID) 250 MG tablet Take 250 mg by mouth daily.    . vitamin E 400 UNIT capsule Take 400 Units by mouth daily.    . Vitamins A & D 5000-400 units CAPS Take 5,000 Units by mouth daily.    Marland Kitchen zolpidem (AMBIEN) 10 MG tablet TAKE ONE TABLET AT BEDTIME AS NEEDED FOR SLEEP 30 tablet 2    Objective: BP 147/87 (BP Location: Right Arm)   Pulse 89   Temp 98.5 F (36.9 C) (Oral)   Resp 20   Ht 5\' 4"  (1.626 m)   Wt 76.7 kg (169 lb)   SpO2 97%   BMI 29.01 kg/m  Exam: General: NAD, rests comfortably in bed, appears stated age Eyes: PERRL, EOMI, no scleral icterus, no conjunctival pallor or injection  ENTM: no pharyngeal erythema or exudate, mucous membranes dry, no rhinorrhea or congestion Neck: supple, no lymphadenopathy Cardiovascular: RRR, no m/r/g Respiratory: CTA bil, no W/R/R Gastrointestinal: soft and nontender to deep palpation, no rebound or guarding, no hepatosplenomegaly, normoactive bowel sounds appreciated MSK: full ROM in 4 extremities, moving all extremiteis equally Derm: Right lateral calf, Stage 2 ulcer, clean and dry. Left lateral calf with stage 3, minimally draining serous  fluid. Bilaterally some slight surrounding erythema without tracking or extension up the  leg. No erythema warmth or edema. No exudative drainage.  Neuro: CN II-XII grossly intact Psych: alert and oriented, affect appropriate, thought process linear  Labs and Imaging: CBC BMET   Recent Labs Lab 09/23/16 1950  WBC 5.6  HGB 10.0*  HCT 31.1*  PLT 296    Recent Labs Lab 09/23/16 1950  NA 138  K 3.8  CL 104  CO2 23  BUN 23*  CREATININE 2.03*  GLUCOSE 99  CALCIUM 9.6       Everrett Coombe, MD 09/24/2016, 3:37 AM PGY-1, [place] Intern pager: [phone], text pages welcome   Upper Level Addendum:  I have seen and evaluated this patient along with Dr. Burr Medico and reviewed the above note, making necessary revisions in red.   Elberta Leatherwood, MD,MS,  PGY3 09/24/2016 6:59 AM

## 2016-09-24 NOTE — Progress Notes (Signed)
New Admission Note:  Arrival Method: Stretcher from ED with NT Mental Orientation: A&O x4 Telemetry: N/A Assessment: Completed Skin: assessed with Janora Norlander, RN, check flowsheets IV: R AC, bolus infusing Pain: 10/10 bilateral lower extremities, RN will administer pain medication Tubes: N/A Safety Measures: Safety Fall Prevention Plan discussed with patient Admission: Completed [address] [place] Orientation: Patient has been orientated to the room, unit and the staff. Family: Patients friend at bedside  Orders have been reviewed and implemented. Will continue to monitor the patient. Call light has been placed within reach.  Nena Polio BSN, RN  Phone Number: [phone]

## 2016-09-24 NOTE — Progress Notes (Signed)
Initial Nutrition Assessment  DOCUMENTATION CODES:   Non-severe (moderate) malnutrition in context of acute illness/injury  INTERVENTION:  Provide nourishment snacks between meals.   Encourage adequate PO intake.   NUTRITION DIAGNOSIS:   Malnutrition (Moderate) related to acute illness as evidenced by percent weight loss.  GOAL:   Patient will meet greater than or equal to 90% of their needs  MONITOR:   PO intake, Labs, Weight trends, Skin, I & O's  REASON FOR ASSESSMENT:   Malnutrition Screening Tool    ASSESSMENT:   60 y.o. female presenting with emesis and abdominal pain x1d. PMH is significant for T2DM, HTN, hx CVA, s/p gastric bypass, iron deficiency anemia, diverticulosis, Asthma, GERD, chronic LE wounds  Meal completion has been 100%. Pt reports having a good appetite with no other difficulties currently. Pt reports eating well at home with usual intake of at least 2 meals a day with snacks in between. Pt reports weight has been stable, however per weight records, pt with a 7% weight loss in 10 days. Pt with chronic LE wounds. Pt is agreeable to nourishment snacks. Will place order for nursing staff to provide between meals.   Nutrition-Focused physical exam completed. Findings are no fat depletion, mild muscle depletion, and no edema.   Labs and medications reviewed.   Diet Order:  Diet Heart Room service appropriate? Yes; Fluid consistency: Thin  Skin:  Wound (see comment) (chronic LE wounds)  Last BM:  11/24  Height:   Ht Readings from Last 1 Encounters:  09/24/16 5\' 4"  (1.626 m)    Weight:   Wt Readings from Last 1 Encounters:  09/24/16 168 lb 14 oz (76.6 kg)    Ideal Body Weight:  54.5 kg  BMI:  Body mass index is 28.99 kg/m.  Estimated Nutritional Needs:   Kcal:  1850-2050  Protein:  90-100 grams  Fluid:  1.8 - 2 L/day  EDUCATION NEEDS:   No education needs identified at this time  Corrin Parker, MS, RD, LDN Pager #  [phone] After hours/ weekend pager # [phone]

## 2016-09-24 NOTE — ED Provider Notes (Signed)
[facility] DEPT Provider Note   CSN: [id number] Arrival date & time: 09/23/16  1931  By signing my name below, I, Hansel Feinstein, attest that this documentation has been prepared under the direction and in the presence of Ezequiel Essex, MD. Electronically Signed: Hansel Feinstein, ED Scribe. 09/24/16. 1:33 AM.     History   Chief Complaint Chief Complaint  Patient presents with  . Leg Pain  . Emesis    HPI [name] is a 60 y.o. female who presents to the Emergency Department complaining of multiple episodes of emesis onset yesterday with associated nausea, periumbilical abdominal pain, fever (Tmax 101 yesterday, none today). Pt states her abdominal pain is intermittent and worsened with emesis. Pt reports recent sick contact with son and grandchildren, who have similar symptoms. Pt has h/o hysterectomy and tummy tuck, but no h/o appendectomy or cholecystectomy. Gastric bypass 2013 Pt also complains of increased pain to leg wounds which she has had for 3 months. Pt is followed by a wound clinic, and states she is prescribed Tramadol for pain. Per pt, Tramadol does not adequately control her pain. Pt states the wounds have been draining recently. She notes she has been taking Bactrim for 2 weeks and has 2 doses left. Pt is no longer taking insulin for h/o DM d/t weight loss. She states her CBGs have been well controlled. She is not currently taking any blood thinners. She denies CP, SOB, diarrhea, melena, hematochezia.    The history is provided by the patient. No language interpreter was used.    Past Medical History:  Diagnosis Date  . Anemia   . Arthritis    knees  . Back pain 10/04/2012  . Blood in stool 10/07/2014  . Blood transfusion without reported diagnosis   . Bunion 03/28/2014  . 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 Degenerative arthritis of right shoulder region 08/2013  . Diverticulosis   . GERD  (gastroesophageal reflux disease)   . High cholesterol   . History of GI bleed   . Hypertension    under control with meds., has been on med. > 20 yr.  . Lower GI bleed   . Medial meniscus tear 1997   Right Knee  . Mini stroke ([place])   . Rotator cuff rupture, complete 08/2013   right  . Shoulder impingement 08/2013   right  . Stroke [facility]) 2006   Remote left lacunar infarct noted on CT head 2006, 2010   . Ulcer ([place])   . Wears dentures    upper  . Wears partial dentures    lower    Patient Active Problem List   Diagnosis Date Noted  . Cellulitis of leg, left 09/14/2016  . Healthcare maintenance 08/05/2016  . Skin lesion of right lower extremity 05/27/2016  . DDD (degenerative disc disease), lumbar 04/14/2016  . Cervical pain (neck) 03/26/2016  . Lumbar back pain 03/26/2016  . Osteoarthritis of both knees 03/12/2016  . Cramping of hands 02/27/2016  . Hypoalbuminemia 01/30/2016  . Superficial thrombophlebitis 01/29/2016  . Leg swelling 01/29/2016  . Vision changes 12/30/2015  . S/P laparoscopic colectomy 12/25/2015  . Premature supraventricular beats 12/25/2015  . Tension type headache 05/26/2015  . Muscle pain 05/26/2015  . Chest pain 05/12/2015  . Diverticulosis of colon without hemorrhage 11/18/2014  . Pain in joint, shoulder region 07/25/2014  . Muscle cramps 04/22/2014  . Biliary sludge 04/08/2014  . Constipation 10/19/2013  .  Subacromial bursitis 05/21/2013  . Eczema 05/21/2013  . External hemorrhoids without complication [id number]  . Insomnia 08/29/2012  . GERD (gastroesophageal reflux disease) 08/24/2012  . S/P gastric bypass 06/07/2012  . Dysuria 03/20/2012  . Asthma, cough variant 03/10/2012  . Diverticulosis of colon with hemorrhage   . Hypopigmented skin lesion 02/23/2011  . Allergic rhinitis 05/27/2010  . POSTMENOPAUSAL STATUS 04/07/2010  . LIBIDO, DECREASED 10/16/2009  . H/O type 2 diabetes mellitus 01/25/2008  . CYST, KIDNEY, ACQUIRED 08/15/2007   . CEREBROVASCULAR ACCIDENT, HX OF 07/26/2007  . HTN, goal below 140/90 05/12/2007  . OBESITY, NOS 12/29/2006  . Iron deficiency anemia 12/29/2006  . Venous (peripheral) insufficiency 12/29/2006    Past Surgical History:  Procedure Laterality Date  . ABDOMINAL HYSTERECTOMY  ?1987   partial  . COLONOSCOPY  05/02/2000; 05/08/2013  . COSMETIC SURGERY  02/22/2014   skin removal surgery from lower abdomen   . ENDOVENOUS ABLATION SAPHENOUS VEIN W/ LASER Right 08/12/2016   endovenous laser ablation right greater saphenous vein by Curt Jews MD   . METATARSAL OSTEOTOMY Right 1995   Right Bunion Repair  . 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 SHOULDER ARTHROSCOPY WITH ROTATOR CUFF REPAIR AND SUBACROMIAL DECOMPRESSION Right 08/09/2013   Procedure: RIGHT SHOULDER ARTHROSCOPY WITH SUBACROMIAL DECOMPRESSION, DISTAL CLAVICLE EXCISION AND ROTATOR CUFF REPAIR and release biceps;  Surgeon: Ninetta Lights, MD;  Location: [place];  Service: Orthopedics;  Laterality: Right;  . SUBTOTAL COLECTOMY  11/24/15   [place] Left   . TOTAL KNEE ARTHROPLASTY Right     OB History    No data available       Home Medications    Prior to Admission medications   Medication Sig Start Date End Date Taking? Authorizing Provider  aspirin 81 MG tablet Take 81 mg by mouth daily.    Historical Provider, MD  atorvastatin (LIPITOR) 40 MG tablet TAKE ONE TABLET EACH DAY 09/01/16   [name], MD  calcium carbonate (OS-CAL) 600 MG TABS tablet Take 1 tablet (600 mg total) by mouth 2 (two) times daily with a meal. 01/07/14   Boykin Nearing, MD  cetirizine (ZYRTEC) 10 MG tablet Take 1 tablet (10 mg total) by mouth daily. 05/19/16   Mercy Riding, MD  Cholecalciferol (D3 MAXIMUM STRENGTH) 5000 units capsule Take 5,000 Units by mouth daily.    Historical Provider, MD  cyclobenzaprine (FLEXERIL) 10 MG tablet Take 1 tablet (10 mg total) by mouth 3 (three) times daily as needed for  muscle spasms. Patient not taking: Reported on 09/15/2016 03/06/16   Davonna Belling, MD  diclofenac sodium (VOLTAREN) 1 % GEL Apply 2 g topically 4 (four) times daily. 07/11/16   Nicole Pisciotta, PA-C  docusate sodium (DOK) 100 MG capsule TAKE ONE CAPSULE TWICE A DAY AS NEEDED FOR CONSTIPATION 02/27/16   [name], MD  esomeprazole (NEXIUM) 40 MG capsule Take 1 tab every morning. 09/01/16   Amy S Esterwood, PA-C  ferrous sulfate 220 (44 FE) MG/5ML solution Take 5 mLs (220 mg total) by mouth 2 (two) times daily with a meal. 07/17/15   [name], MD  fluticasone (FLONASE) 50 MCG/ACT nasal spray Place 2 sprays into both nostrils at bedtime. 03/02/16   [name], MD  furosemide (LASIX) 20 MG tablet TAKE TWO TABLETS EVERY DAY 05/24/16   [name], MD  hydrochlorothiazide (MICROZIDE) 12.5 MG capsule TAKE ONE CAPSULE EACH DAY 09/22/16   Mary Sella  Ree Kida, MD  Hydrocodone-Acetaminophen 5-217 MG/10ML SOLN Take 10 mLs by mouth every 6 (six) hours as needed. 09/17/16   [name], MD  lisinopril (PRINIVIL,ZESTRIL) 20 MG tablet Take 1 tablet (20 mg total) by mouth daily. 02/27/16   [name], MD  Meth-Hyo-M Barnett Hatter Phos-Ph Sal (URIBEL) 118 MG CAPS Take 1 capsule (118 mg total) by mouth 2 (two) times daily as needed. 08/17/16   [name], MD  Multiple Vitamin (MULTIVITAMIN) tablet Take 1 tablet by mouth daily. 01/07/14   Josalyn Funches, MD  polyethylene glycol powder (GLYCOLAX/MIRALAX) powder TAKE 17G BY MOUTH TWICE DAILY 11/19/15   [name], MD  sucralfate (CARAFATE) 1 GM/10ML suspension Take 10 mLs (1 g total) by mouth 4 (four) times daily -  with meals and at bedtime. 05/27/16   Eloise Levels, MD  sulfamethoxazole-trimethoprim (BACTRIM DS,SEPTRA DS) 800-160 MG tablet Take 1 tablet by mouth 2 (two) times daily. 09/14/16   Sela Hilding, MD  traMADol (ULTRAM) 50 MG tablet Take 1 tablet (50 mg total) by mouth every 8 (eight) hours as needed for moderate pain or severe pain. 07/07/16   [name], MD  vitamin B-12 (CYANOCOBALAMIN) 1000 MCG tablet Take 1,000 mcg by mouth daily.    Historical Provider, MD  vitamin C (ASCORBIC ACID) 250 MG tablet Take 250 mg by mouth daily.    Historical Provider, MD  vitamin E 400 UNIT capsule Take 400 Units by mouth daily.    Historical Provider, MD  Vitamins A & D 5000-400 units CAPS Take 5,000 Units by mouth daily. 03/16/16   Historical Provider, MD  zolpidem (AMBIEN) 10 MG tablet TAKE ONE TABLET AT BEDTIME AS NEEDED FOR SLEEP 05/26/16   [name], MD    Family History Family History  Problem Relation Age of Onset  . Cancer Father 36    Died from complications of colon CA  . Colon cancer Father     died in his 52s  . Diabetes Mother   . Hypertension Mother   . Diabetes Sister   . Heart disease Sister   . Diabetes Sister   . Stroke Sister   . Diabetes Brother   . Stroke Brother   . Diabetes Brother   . Diabetes Brother   . Diabetes Brother     Social History Social History  Substance Use Topics  . Smoking status: Never Smoker  . Smokeless tobacco: Never Used  . Alcohol use 0.0 oz/week     Comment: occasional wine     Allergies   Aspirin; Nsaids; and Tolmetin   Review of Systems Review of Systems A complete 10 system review of systems was obtained and all systems are negative except as noted in the HPI and PMH.    Physical Exam Updated Vital Signs BP 147/87 (BP Location: Right Arm)   Pulse 89   Temp 98.5 F (36.9 C) (Oral)   Resp 20   Ht 5\' 4"  (1.626 m)   Wt 169 lb (76.7 kg)   SpO2 97%   BMI 29.01 kg/m   Physical Exam  Constitutional: She is oriented to person, place, and time. She appears well-developed and well-nourished. No distress.  Anxious  HENT:  Head: Normocephalic and atraumatic.  Mouth/Throat: No oropharyngeal exudate.  Dry mucus membranes  Eyes: Conjunctivae and EOM are normal. Pupils are equal, round, and reactive to light.  Neck: Normal range of motion. Neck supple.  No meningismus.    Cardiovascular: Normal rate, regular rhythm,  normal heart sounds and intact distal pulses.   No murmur heard. Pulmonary/Chest: Effort normal and breath sounds normal. No respiratory distress.  Abdominal: Soft. There is tenderness. There is no rebound and no guarding.  Suprapubic abdominal tenderness. No guarding, no rebound.    Musculoskeletal: Normal range of motion. She exhibits no edema or tenderness.  Right lateral lower leg ulcer is 4 x 10 cm. Left lateral leg ulcer is 3 x 4 cm. Left ulcer has foul smelling fibrinous exudate. One on the right appears clean based. (see photos)  Intact DP pulses bilaterally.  Neurological: She is alert and oriented to person, place, and time. No cranial nerve deficit. She exhibits normal muscle tone. Coordination normal.  No ataxia on finger to nose bilaterally. No pronator drift. 5/5 strength throughout. CN 2-12 intact.Equal grip strength. Sensation intact.   Skin: Skin is warm.  Psychiatric: She has a normal mood and affect. Her behavior is normal.  Nursing note and vitals reviewed.        ED Treatments / Results   DIAGNOSTIC STUDIES: Oxygen Saturation is 97% on RA, normal by my interpretation.    COORDINATION OF CARE: 1:33 AM Discussed treatment plan with pt at bedside which includes lab work and pt agreed to plan.    Labs (all labs ordered are listed, but only abnormal results are displayed) Labs Reviewed  COMPREHENSIVE METABOLIC PANEL - Abnormal; Notable for the following:       Result Value   BUN 23 (*)    Creatinine, Ser 2.03 (*)    Total Bilirubin <0.1 (*)    GFR calc non Af Amer 25 (*)    GFR calc Af Amer 30 (*)    All other components within normal limits  CBC - Abnormal; Notable for the following:    RBC 3.81 (*)    Hemoglobin 10.0 (*)    HCT 31.1 (*)    RDW 17.3 (*)    All other components within normal limits  URINALYSIS, ROUTINE W REFLEX MICROSCOPIC (NOT AT [facility]) - Abnormal; Notable for the following:    APPearance  CLOUDY (*)    Protein, ur 30 (*)    All other components within normal limits  URINE MICROSCOPIC-ADD ON - Abnormal; Notable for the following:    Squamous Epithelial / LPF 0-5 (*)    Bacteria, UA FEW (*)    Casts HYALINE CASTS (*)    All other components within normal limits  LIPASE, BLOOD    EKG  EKG Interpretation  Date/Time:  Friday September 24 2016 04:13:33 EST Ventricular Rate:  89 PR Interval:    QRS Duration: 86 QT Interval:  379 QTC Calculation: 462 R Axis:   33 Text Interpretation:  Sinus rhythm Probable left atrial enlargement No significant change was found Confirmed by Wyvonnia Dusky  MD, Annie Main [id number]) on 09/24/2016 4:17:19 AM Also confirmed by Wyvonnia Dusky  MD, Maytte Jacot [phone]), editor [place] CT, Leda Gauze [phone])  on 09/24/2016 8:12:55 AM       Radiology No results found.  Procedures Procedures (including critical care time)  Medications Ordered in ED Medications  ondansetron (ZOFRAN-ODT) disintegrating tablet 4 mg (4 mg Oral Given 09/23/16 1943)     Initial Impression / Assessment and Plan / ED Course  I have reviewed the triage vital signs and the nursing notes.  Pertinent labs & imaging results that were available during my care of the patient were reviewed by me and considered in my medical decision making (see chart for details).  Clinical Course  nausea and vomiting since yesterday with sick contacts and fever at home.  Chronic leg wounds that do not appear infected.  AKI on labs.  IVF and symptom control given. Abdomen soft and nontender.  AAS without obstruction.  Stop bactrim in setting of AKI. LActate and WBC normal.  Cultures sent.  Plan admission for rehydration. D/w [facility] residents.  Final Clinical Impressions(s) / ED Diagnoses   Final diagnoses:  Acute renal failure, unspecified acute renal failure type (HCC)  Intractable vomiting with nausea, unspecified vomiting type    New Prescriptions New Prescriptions   No medications on file    I  personally performed the services described in this documentation, which was scribed in my presence. The recorded information has been reviewed and is accurate.    Ezequiel Essex, MD 09/24/16 [phone]

## 2016-09-24 NOTE — Progress Notes (Signed)
FPTS Interim Progress Note  S: Patient feels significantly better since admission. She states that her urine output has increased and she hadn't noticed, she had urinated until she began receiving IV fluids. Breakfast was sitting at bedside and appears as though she had eaten approximately 50% of this. She denies any vomiting since admission. No BMs but endorses continued flatulence.  O: BP (!) 164/94 (BP Location: Left Arm)   Pulse 85   Temp 98.7 F (37.1 C) (Oral)   Resp 16   Ht 5\' 4"  (1.626 m)   Wt 168 lb 14 oz (76.6 kg)   SpO2 100%   BMI 28.99 kg/m   General: NAD, resting comfortably in bed, appears stated age HEENT: PERRL, EOMI, improved tacky mucous membranes, no scleral icterus, no conjunctival pallor or injection. Neck full ROM Cardiovascular: RRR, no m/r/g Respiratory: CTA bil, no W/R/R Gastrointestinal: soft and nontender to deep palpation, no rebound or guarding, no hepatosplenomegaly, normoactive bowel sounds appreciated MSK: full ROM in 4 extremities, moving all extremiteis equally Integument: Right lateral calf, Stage 2 ulcer, clean and dry. Left lateral calf with stage 3, minimally draining serous fluid. Bilaterally some slight surrounding erythema without tracking or extension up the leg. No erythema warmth or edema. No exudative drainage.    A/P: Nausea vomiting - Continue anti-emetics; Zofran - continue diet as tolerated. AKI - aggressive IVF x12hrs.; 1/2 this after 12hrs - repeat BMP in AM. Leg wounds - f/u wound care recs - consider [place] placement.  HTN - holding meds due to AKI - start amlodipine 5mg    Elberta Leatherwood, MD 09/24/2016, 11:14 AM PGY-3, [facility] Service pager [phone]

## 2016-09-24 NOTE — Consult Note (Signed)
   [facility] CM Inpatient Consult   09/24/2016  [name] 04-15-1956 [id number]   Patient is currently active with [facility] for chronic disease management services.  Patient has been engaged by a [facility].  MD notes for history and physical reveals, [name] is a 60 y.o. female presenting with emesis and abdominal pain x1d. PMH is significant for T2DM, HTN, hx CVA, s/p gastric bypass, iron deficiency anemia, diverticulosis, Asthma, GERD, chronic LE wounds    Our community based plan of care has focused on disease management and community resource support. Met with the patient at bedside.  Patient just getting to the unit this morning and was tired.  Consents to ongoing Care Management follow up.  Active consent on file.  She endorses [employer] as her contact person.  Patient will receive a post discharge transition of care call and will be evaluated for monthly home visits for assessments and disease process education.  Made Inpatient Case Manager aware that [facility] following. Of note, [facility] Care Management services does not replace or interfere with any services that are needed or arranged by inpatient case management or social work.  For additional questions or referrals please contact:  Natividad Brood, RN BSN [facility] Liaison  [phone] business mobile phone Toll free office [phone]

## 2016-09-24 NOTE — ED Notes (Signed)
Dr. Wyvonnia Dusky at bedside at this time.

## 2016-09-25 DIAGNOSIS — E440 Moderate protein-calorie malnutrition: Secondary | ICD-10-CM | POA: Diagnosis not present

## 2016-09-25 DIAGNOSIS — N179 Acute kidney failure, unspecified: Secondary | ICD-10-CM | POA: Diagnosis not present

## 2016-09-25 DIAGNOSIS — L03116 Cellulitis of left lower limb: Secondary | ICD-10-CM

## 2016-09-25 DIAGNOSIS — R112 Nausea with vomiting, unspecified: Secondary | ICD-10-CM | POA: Diagnosis not present

## 2016-09-25 LAB — BASIC METABOLIC PANEL
Anion gap: 7 (ref 5–15)
BUN: 7 mg/dL (ref 6–20)
CO2: 24 mmol/L (ref 22–32)
Calcium: 8.9 mg/dL (ref 8.9–10.3)
Chloride: 109 mmol/L (ref 101–111)
Creatinine, Ser: 0.69 mg/dL (ref 0.44–1.00)
GFR calc Af Amer: >60 mL/min (ref >60)
GFR calc non Af Amer: >60 mL/min (ref >60)
Glucose, Bld: 90 mg/dL (ref 65–99)
Potassium: 4.1 mmol/L (ref 3.5–5.1)
Sodium: 140 mmol/L (ref 135–145)

## 2016-09-25 LAB — GLUCOSE, CAPILLARY: Glucose-Capillary: 90 mg/dL (ref 65–99)

## 2016-09-25 MED ORDER — ONDANSETRON HCL 4 MG PO TABS: 4.0000 mg | ORAL_TABLET | Freq: Four times a day (QID) | ORAL | 0 refills | Status: DC | PRN

## 2016-09-25 NOTE — Discharge Instructions (Signed)
You were hospitalized due to inability to keep down food with nausea and vomiting which improved in the hospital. Your kidney function was mildly affected by the vomiting and so we monitored your kidneys in the hospital and they improved. Please come to the [facility] for your hospital follow up visit which is scheduled for Tuesday.

## 2016-09-25 NOTE — Progress Notes (Addendum)
Family Medicine Teaching Service Daily Progress Note Intern Pager: [phone]  [name] Medical record number: [id number] Date of birth: 08/27/1956 Age: 60 y.o. Gender: female  Primary Care Provider: [name], MD Consultants: None  Code Status: Full  Pt Overview and Major Events to Date:  09/24/2016 admit to FPTS obs  Assessment and Plan: Franca MIKELLA AMIRI is a 60 y.o. female presenting with emesis and abdominal pain x1d. PMH is significant for T2DM, HTN, hx CVA, s/p gastric bypass, iron deficiency anemia, diverticulosis, Asthma, GERD, chronic LE wounds  Nausea/vomiting, improved - likely secondary to viral gastroenteritis given brief course and multiple sick contacts at home. SBO less likely given regular bowel movements, ACS ruled out with negative troponins and unconcerning ECG.  - continue anti-emetics; Zofran - continue diet as tolerated - blood cultures NGTD  AKI, resolved -  Cr 2.0 >>>1.25 >>0.69. Baseline 0.6-0.8,was likely prerenal in the setting of vomiting. - s/p aggressive IVF x12hrs.; and 1/2 this for another 12hrs - will f/u AM BMP  Leg wounds - Chronic, >4 months. Followed by wound care as outpatient. Plans for skin graft. No signs of infection this admission.  - f/u wound care recs  HTN Blood pressure elevated on admission, with amlodipine. 144/80 overnight. Held home regimen lisinopril 20 mg qd, HCTZ 12.5 mg qd for AKI. - can restart home regimen on discharge, no need to keep amlodipine  Type 2 Diabetes Mellitus, resolved - Last A1c 5.6 in 03/2016. Patient is s/p gastric bypass surgery, and has required no diabetes medications since gastric bypass (~41yrs ago) - monitor CBG's qAM  Anemia, Iron deficiency, chronic - Hgb stable at 10.0 on admission (previous recent Hgb 9.6-10.4). Anemia profile in 07/2015 demonstrated Iron low at 10, TIBC elevated at 459, ferritin 12 consistent with iron deficiency anemia. Patient chronically on vitamin B12 and  ferrous sulfate at home. - monitor Hgb - restart vitamins at discharge  Hx CVA - home medication lipitor 40 mg qd, last lipid panel 03/2016 Cholesterol 141, Triglycerides 88, HDL 104, LDL 19. - continue home statin  FEN/GI: SLIV, cardiac diet  Prophylaxis: lovenox  Disposition: Home today  Subjective:  Endorses tolerating ~50% meal last night. No nausea this AM. No episodes of emesis. With nausea last night received zofran. No acute events overnight.  Objective: Temp:  [98.1 F (36.7 C)-98.3 F (36.8 C)] 98.3 F (36.8 C) (11/25 0506) Pulse Rate:  [71-74] 74 (11/25 0506) Resp:  [16] 16 (11/25 0506) BP: (142-144)/(80-84) 144/80 (11/25 0506) SpO2:  [100 %] 100 % (11/25 0506) Weight:  [76.4 kg (168 lb 8 oz)] 76.4 kg (168 lb 8 oz) (11/25 0506) Physical Exam: General: NAD, resting comfortably in bed, appears stated age Cardiovascular: RRR, no m/r/g Respiratory: CTA bil, no W/R/R Gastrointestinal: soft, minimally tender right upper and lower quadrants, nd, normoactive BS  MSK: full ROM in 4 extremities, moving all extremiteis equally Integument: bilateral calf ulcers clean and dry dressings in place, right calf ulcer with serosanguinous fluid on bandage, no exudate  Laboratory:  Recent Labs Lab 09/23/16 1950 09/24/16 0535  WBC 5.6 5.8  HGB 10.0* 9.5*  HCT 31.1* 29.8*  PLT 296 256    Recent Labs Lab 09/23/16 1950 09/24/16 0535  NA 138 137  K 3.8 3.5  CL 104 103  CO2 23 23  BUN 23* 19  CREATININE 2.03* 1.25*  CALCIUM 9.6 8.7*  PROT 7.5  --   BILITOT <0.1*  --   ALKPHOS 71  --  ALT 25  --   AST 34  --   GLUCOSE 99 102*      Imaging/Diagnostic Tests: No results found.   Everrett Coombe, MD 09/25/2016, 6:26 AM PGY-1, [place] Intern pager: [phone], text pages welcome

## 2016-09-27 ENCOUNTER — Other Ambulatory Visit: Payer: Self-pay | Admitting: *Deleted

## 2016-09-27 LAB — GLUCOSE, CAPILLARY: Glucose-Capillary: 113 mg/dL — ABNORMAL HIGH (ref 65–99)

## 2016-09-27 MED ORDER — ZOLPIDEM TARTRATE 10 MG PO TABS: ORAL_TABLET | ORAL | 2 refills | Status: DC

## 2016-09-27 NOTE — Telephone Encounter (Signed)
RN staff - please call in Ambien 10 mg QHS prn insomnia, dispense #30, refill #2, thanks

## 2016-09-27 NOTE — Patient Outreach (Signed)
[facility]) Care Management  09/27/2016  Toy Baker Dema Severin 17-Mar-1956 [id number]  Covering for Valente David, RN Case Manager RN spoke with pt today and introduced the [facility] program and available services. Pt verified she discharged from the hospital and her follow up visits are tomorrow with her primary provider and the wound clinic. RN inquired further on her discharge sheet however pt not at home and does not have the discharge sheet near by. Pt states she will not be able to obtain the discharge paperwork until tomorrow when she returns home. Pt states she can not remember all her medications without the discharge paperwork but willing to review this information on tomorrow. RN offered a follow up call tomorrow afternoon followed by all her pending appointments  (pt very receptive and grateful).  RN will report to [facility] and request a call back to pt for tomorrow afternoon to complete the transition of care template and verify all prescribed medications for pt's adherence. No other inquires or request at this time.   Raina Mina, RN Care Management Coordinator [place] Office [phone]

## 2016-09-28 ENCOUNTER — Ambulatory Visit (INDEPENDENT_AMBULATORY_CARE_PROVIDER_SITE_OTHER): Payer: Commercial Managed Care - HMO | Admitting: Family Medicine

## 2016-09-28 ENCOUNTER — Other Ambulatory Visit: Payer: Self-pay | Admitting: *Deleted

## 2016-09-28 ENCOUNTER — Encounter: Payer: Self-pay | Admitting: Family Medicine

## 2016-09-28 VITALS — BP 126/97 | HR 88 | Temp 98.4°F | Wt 171.4 lb

## 2016-09-28 DIAGNOSIS — I10 Essential (primary) hypertension: Secondary | ICD-10-CM | POA: Diagnosis not present

## 2016-09-28 DIAGNOSIS — L97812 Non-pressure chronic ulcer of other part of right lower leg with fat layer exposed: Secondary | ICD-10-CM | POA: Diagnosis not present

## 2016-09-28 DIAGNOSIS — Z09 Encounter for follow-up examination after completed treatment for conditions other than malignant neoplasm: Secondary | ICD-10-CM | POA: Diagnosis not present

## 2016-09-28 DIAGNOSIS — L97212 Non-pressure chronic ulcer of right calf with fat layer exposed: Secondary | ICD-10-CM | POA: Diagnosis not present

## 2016-09-28 DIAGNOSIS — S81801A Unspecified open wound, right lower leg, initial encounter: Secondary | ICD-10-CM | POA: Diagnosis not present

## 2016-09-28 DIAGNOSIS — L97222 Non-pressure chronic ulcer of left calf with fat layer exposed: Secondary | ICD-10-CM | POA: Diagnosis not present

## 2016-09-28 DIAGNOSIS — I890 Lymphedema, not elsewhere classified: Secondary | ICD-10-CM | POA: Diagnosis not present

## 2016-09-28 DIAGNOSIS — I83012 Varicose veins of right lower extremity with ulcer of calf: Secondary | ICD-10-CM | POA: Diagnosis not present

## 2016-09-28 DIAGNOSIS — I87331 Chronic venous hypertension (idiopathic) with ulcer and inflammation of right lower extremity: Secondary | ICD-10-CM | POA: Diagnosis not present

## 2016-09-28 NOTE — Telephone Encounter (Signed)
Rx called into patient pharmacy. 

## 2016-09-28 NOTE — Progress Notes (Signed)
   CC: hospital follow up  HPI Used zofran once last night. Two episodes of vomiting - clear liquid, feels hot around this time. Just picked up zofran yesterday. Random. Viral bug, thinks she got this from an older lady that she keeps on the wekeends who might have the flu. Although she says this patient is on antibiotics, so what exactly she was exposed to seems unclear.   CC, SH/smoking status, and VS noted  Objective: BP (!) 126/97 (BP Location: Right Arm, Patient Position: Sitting, Cuff Size: Normal)   Pulse 88   Temp 98.4 F (36.9 C) (Oral)   Wt 171 lb 6.4 oz (77.7 kg)   BMI 29.42 kg/m  Gen: NAD, alert, cooperative, and pleasant. HEENT: NCAT, EOMI, PERRL CV: RRR, no murmur Resp: CTAB, no wheezes, non-labored Abd: SNTND, BS present, no guarding or organomegaly Ext: No edema, warm Neuro: Alert and oriented, Speech clear, No gross deficits  Assessment and plan:  Hospital follow up, dehydration and AKI 2/2 likely gastro: -AKI resolved prior to discharge, will not recheck BMP today -patient able to tolerate increasing amounts PO, has only vomited twice since discharge -reports good PO liquid intake, has used her zofran once.  -counseled to continue zofran PRN and RTC if worsening symptoms -went over meds, patient has a pill box at home and understands d/c meds  Health maintenance:  -patient says she has gotten her eye exam done, but is working on paying for the glasses and thus the report hasn't been sent to [place] yet -encouraged her to schedule her mammogram. She says she needs to schedule this for both she and her sister, will call to do this this week.    Ralene Ok, MD, PGY1 09/28/2016 2:14 PM

## 2016-09-28 NOTE — Discharge Summary (Signed)
[name]  [name] Medical record number: [id number] Date of birth: 05/31/1956 Age: 60 y.o. Gender: female Date of Admission: 09/24/2016  Date of Discharge: 09/25/2016 Admitting Physician: Hannah Rio, MD  Primary Care Provider: [name], MD Consultants: None  Indication for Hospitalization: Emesis and Abdominal Pain  Discharge Diagnoses/Problem List:  Patient Active Problem List   Diagnosis Date Noted  . Acute renal failure ([place]) 09/24/2016  . Intractable vomiting 09/24/2016  . Malnutrition of moderate degree 09/24/2016  . Cellulitis of leg, left 09/14/2016  . Healthcare maintenance 08/05/2016  . Skin lesion of right lower extremity 05/27/2016  . DDD (degenerative disc disease), lumbar 04/14/2016  . Cervical pain (neck) 03/26/2016  . Lumbar back pain 03/26/2016  . Osteoarthritis of both knees 03/12/2016  . Cramping of hands 02/27/2016  . Hypoalbuminemia 01/30/2016  . Superficial thrombophlebitis 01/29/2016  . Leg swelling 01/29/2016  . Vision changes 12/30/2015  . S/P laparoscopic colectomy 12/25/2015  . Premature supraventricular beats 12/25/2015  . Tension type headache 05/26/2015  . Muscle pain 05/26/2015  . Chest pain 05/12/2015  . Diverticulosis of colon without hemorrhage 11/18/2014  . Pain in joint, shoulder region 07/25/2014  . Muscle cramps 04/22/2014  . Biliary sludge 04/08/2014  . Constipation 10/19/2013  . Subacromial bursitis 05/21/2013  . Eczema 05/21/2013  . External hemorrhoids without complication [id number]  . Insomnia 08/29/2012  . GERD (gastroesophageal reflux disease) 08/24/2012  . S/P gastric bypass 06/07/2012  . Dysuria 03/20/2012  . Asthma, cough variant 03/10/2012  . Diverticulosis of colon with hemorrhage   . Hypopigmented skin lesion 02/23/2011  . Allergic rhinitis 05/27/2010  . POSTMENOPAUSAL STATUS 04/07/2010  . LIBIDO, DECREASED 10/16/2009  . H/O type  2 diabetes mellitus 01/25/2008  . CYST, KIDNEY, ACQUIRED 08/15/2007  . CEREBROVASCULAR ACCIDENT, HX OF 07/26/2007  . HTN, goal below 140/90 05/12/2007  . OBESITY, NOS 12/29/2006  . Iron deficiency anemia 12/29/2006  . Venous (peripheral) insufficiency 12/29/2006     Disposition: Home  Discharge Condition: Stable  Discharge Exam:  Temp:  [98.1 F (36.7 C)-98.3 F (36.8 C)] 98.3 F (36.8 C) (11/25 0506) Pulse Rate:  [71-74] 74 (11/25 0506) Resp:  [16] 16 (11/25 0506) BP: (142-144)/(80-84) 144/80 (11/25 0506) SpO2:  [100 %] 100 % (11/25 0506) Weight:  [76.4 kg (168 lb 8 oz)] 76.4 kg (168 lb 8 oz) (11/25 0506) Physical Exam: General: NAD, restingcomfortably in bed, appears stated age Cardiovascular:RRR, no m/r/g Respiratory: CTA bil, no W/R/R Gastrointestinal:soft, minimally tender right upper and lower quadrants, nd, normoactive BS  [id number] ROM in 4 extremities, moving all extremiteis equally Integument:bilateral calf ulcers clean and dry dressings in place, right calf ulcer with serosanguinous fluid on bandage, no exudate  Brief Hospital Course:  Hannah Christiansen Whiteis a 60 y.o.femalepresenting with emesis and abdominal pain x1d. PMH is significant for T2DM, HTN, hx CVA, s/p gastric bypass, iron deficiency anemia, diverticulosis, Asthma, GERD, chronic LE wounds.  She had one day of nausea and vomiting with a mild AKI Cr 2.0. She was monitored with fluids overnight. She tolerated abotu 50% of a normal diet the following day and AKI improved, Cr came down to 0.69 likely baseline.  Patient discharged with a small number of zofran to help continue controlling nausea, expect to resolve in the next few days.  Tolerating PO, AKI resolved, considered stable for discharge.  Issues for Follow Up:  1. Follow up nausea and vomiting has improved 2. Follow up that she has  been able to tolerate a normal diet.  Significant Procedures: None  Significant Labs and Imaging:   Recent  Labs Lab 09/23/16 1950 09/24/16 0535  WBC 5.6 5.8  HGB 10.0* 9.5*  HCT 31.1* 29.8*  PLT 296 256    Recent Labs Lab 09/23/16 1950 09/24/16 0535 09/25/16 0521  NA 138 137 140  K 3.8 3.5 4.1  CL 104 103 109  CO2 23 23 24   GLUCOSE 99 102* 90  BUN 23* 19 7  CREATININE 2.03* 1.25* 0.69  CALCIUM 9.6 8.7* 8.9  ALKPHOS 71  --   --   AST 34  --   --   ALT 25  --   --   ALBUMIN 3.8  --   --      Results/Tests Pending at Time of Discharge: None  Discharge Medications:    Medication List    STOP taking these medications   sulfamethoxazole-trimethoprim 800-160 MG tablet Commonly known as:  BACTRIM DS,SEPTRA DS     TAKE these medications   aspirin 81 MG tablet Take 81 mg by mouth daily.   atorvastatin 40 MG tablet Commonly known as:  LIPITOR TAKE ONE TABLET EACH DAY   calcium carbonate 600 MG Tabs tablet Commonly known as:  OS-CAL Take 1 tablet (600 mg total) by mouth 2 (two) times daily with a meal.   cetirizine 10 MG tablet Commonly known as:  ZYRTEC Take 1 tablet (10 mg total) by mouth daily. What changed:  when to take this  reasons to take this   D3 MAXIMUM STRENGTH 5000 units capsule Generic drug:  Cholecalciferol Take 5,000 Units by mouth daily.   docusate sodium 100 MG capsule Commonly known as:  DOK TAKE ONE CAPSULE TWICE A DAY AS NEEDED FOR CONSTIPATION   esomeprazole 40 MG capsule Commonly known as:  NEXIUM Take 1 tab every morning.   ferrous sulfate 220 (44 Fe) MG/5ML solution Take 5 mLs (220 mg total) by mouth 2 (two) times daily with a meal.   fluticasone 50 MCG/ACT nasal spray Commonly known as:  FLONASE Place 2 sprays into both nostrils at bedtime. What changed:  when to take this  reasons to take this   hydrochlorothiazide 12.5 MG capsule Commonly known as:  MICROZIDE TAKE ONE CAPSULE EACH DAY   Hydrocodone-Acetaminophen 5-217 MG/10ML Soln Take 10 mLs by mouth every 6 (six) hours as needed. What changed:  reasons to take  this   lisinopril 20 MG tablet Commonly known as:  PRINIVIL,ZESTRIL Take 1 tablet (20 mg total) by mouth daily.   multivitamin tablet Take 1 tablet by mouth daily.   ondansetron 4 MG tablet Commonly known as:  ZOFRAN Take 1 tablet (4 mg total) by mouth every 6 (six) hours as needed for nausea.   sucralfate 1 GM/10ML suspension Commonly known as:  CARAFATE Take 10 mLs (1 g total) by mouth 4 (four) times daily -  with meals and at bedtime.   traMADol 50 MG tablet Commonly known as:  ULTRAM Take 1 tablet (50 mg total) by mouth every 8 (eight) hours as needed for moderate pain or severe pain.   vitamin B-12 1000 MCG tablet Commonly known as:  CYANOCOBALAMIN Take 1,000 mcg by mouth daily.   vitamin C 250 MG tablet Commonly known as:  ASCORBIC ACID Take 250 mg by mouth daily.   vitamin E 400 UNIT capsule Take 400 Units by mouth daily.   Vitamins A & D 5000-400 units Caps Take 5,000 Units by mouth daily.  Discharge Instructions: Please refer to Patient Instructions section of EMR for full details.  Patient was counseled important signs and symptoms that should prompt return to medical care, changes in medications, dietary instructions, activity restrictions, and follow up appointments.   Follow-Up Appointments: Follow-up Information    Ralene Ok, MD. Go on 09/28/2016.   Specialty:  Family Medicine Why:  Please arrive for your 11AM hospital follow up 15 minutes early at 10:45 AM Contact information: [place] [address] [phone]           Everrett Coombe, MD 09/28/2016, 8:24 AM PGY-1, [place]

## 2016-09-28 NOTE — Patient Instructions (Signed)
It was a pleasure to see you today! Be sure to continue drinking water and try to continue increasing the amount of food you can tolerate. Take the zofran as needed. Be sure to wash your hands frequently to try to keep from spreading the virus.

## 2016-09-28 NOTE — Patient Outreach (Signed)
[place] [facility]) Care Management  09/28/2016  DIANNY OBERHAUSEN [birth year] [id number]   2nd attempt made to complete transition of care assessment (initiate attempt made yesterday by covering care manager, L. Zigmund Daniel).  Member state that this is not a good time to talk as she is on her way out to an appointment.  She request to call this care manager back.  She is informed of business hours, advised to call back by tomorrow if possible.  If no call back by tomorrow, will follow up within the next 2 days.  Valente David, [place], MSN [place] [phone]

## 2016-09-29 LAB — CULTURE, BLOOD (ROUTINE X 2)
Culture: NO GROWTH
Culture: NO GROWTH

## 2016-09-30 ENCOUNTER — Ambulatory Visit (HOSPITAL_COMMUNITY)
Admission: RE | Admit: 2016-09-30 | Discharge: 2016-09-30 | Disposition: A | Discharge status: Home / Self Care | Payer: Commercial Managed Care - HMO | Source: Ambulatory Visit | Attending: Vascular Surgery | Admitting: Vascular Surgery

## 2016-09-30 DIAGNOSIS — Z9889 Other specified postprocedural states: Secondary | ICD-10-CM | POA: Diagnosis not present

## 2016-09-30 DIAGNOSIS — I83812 Varicose veins of left lower extremities with pain: Secondary | ICD-10-CM | POA: Diagnosis not present

## 2016-10-01 ENCOUNTER — Other Ambulatory Visit: Payer: Self-pay | Admitting: *Deleted

## 2016-10-01 NOTE — Patient Outreach (Signed)
[place] [facility]) Care Management  10/01/2016  [name] 1955/11/15 [id number]   2nd attempt to contact member to complete transition of care assessment since initial contact (11/27), unsuccessful.  HIPAA compliant voice message left, will await call back.  If no call back, will follow up next week.  Valente David, [place], MSN [place] [phone]

## 2016-10-04 ENCOUNTER — Encounter: Payer: Self-pay | Admitting: *Deleted

## 2016-10-04 ENCOUNTER — Other Ambulatory Visit: Payer: Self-pay | Admitting: *Deleted

## 2016-10-04 ENCOUNTER — Encounter: Payer: Self-pay | Admitting: Vascular Surgery

## 2016-10-04 NOTE — Patient Outreach (Signed)
[facility]) Care Management  10/04/2016  [name] 10/28/1956 [id number]   4th attempt made to contact member to complete transition of care assessment.  No answer, HIPAA compliant voice message left.  Will send outreach letter, if no response in 10 days will close case.   Valente David, [place], MSN [place] [phone]

## 2016-10-05 ENCOUNTER — Other Ambulatory Visit: Payer: Self-pay | Admitting: Family Medicine

## 2016-10-05 ENCOUNTER — Encounter (HOSPITAL_BASED_OUTPATIENT_CLINIC_OR_DEPARTMENT_OTHER): Payer: Commercial Managed Care - HMO | Attending: Surgery

## 2016-10-05 DIAGNOSIS — I87322 Chronic venous hypertension (idiopathic) with inflammation of left lower extremity: Secondary | ICD-10-CM | POA: Diagnosis not present

## 2016-10-05 DIAGNOSIS — I10 Essential (primary) hypertension: Secondary | ICD-10-CM | POA: Insufficient documentation

## 2016-10-05 DIAGNOSIS — I87333 Chronic venous hypertension (idiopathic) with ulcer and inflammation of bilateral lower extremity: Secondary | ICD-10-CM | POA: Insufficient documentation

## 2016-10-05 DIAGNOSIS — L97212 Non-pressure chronic ulcer of right calf with fat layer exposed: Secondary | ICD-10-CM | POA: Diagnosis not present

## 2016-10-05 DIAGNOSIS — I890 Lymphedema, not elsewhere classified: Secondary | ICD-10-CM | POA: Diagnosis not present

## 2016-10-05 DIAGNOSIS — I83012 Varicose veins of right lower extremity with ulcer of calf: Secondary | ICD-10-CM | POA: Diagnosis not present

## 2016-10-05 DIAGNOSIS — L97822 Non-pressure chronic ulcer of other part of left lower leg with fat layer exposed: Secondary | ICD-10-CM | POA: Diagnosis not present

## 2016-10-05 DIAGNOSIS — L97812 Non-pressure chronic ulcer of other part of right lower leg with fat layer exposed: Secondary | ICD-10-CM | POA: Diagnosis not present

## 2016-10-05 DIAGNOSIS — L03116 Cellulitis of left lower limb: Secondary | ICD-10-CM

## 2016-10-05 NOTE — Telephone Encounter (Signed)
Pt would like a refill on pain medicine and a refill on sleeping medication, but pt would like Dr. Ree Kida to call her first to discuss the pain medicine since pt paid out of pocket for it last time. Please advise. Thanks! ep

## 2016-10-06 NOTE — Telephone Encounter (Signed)
Pt is calling back to see when the doctor was going to call her and send in her medication. jw

## 2016-10-07 ENCOUNTER — Ambulatory Visit (INDEPENDENT_AMBULATORY_CARE_PROVIDER_SITE_OTHER): Payer: Commercial Managed Care - HMO | Admitting: Vascular Surgery

## 2016-10-07 ENCOUNTER — Encounter: Payer: Self-pay | Admitting: Vascular Surgery

## 2016-10-07 VITALS — BP 139/90 | HR 85 | Temp 98.2°F | Resp 16 | Ht 64.0 in | Wt 175.0 lb

## 2016-10-07 DIAGNOSIS — I83023 Varicose veins of left lower extremity with ulcer of ankle: Secondary | ICD-10-CM

## 2016-10-07 DIAGNOSIS — L97322 Non-pressure chronic ulcer of left ankle with fat layer exposed: Secondary | ICD-10-CM | POA: Diagnosis not present

## 2016-10-07 MED ORDER — OXYCODONE-ACETAMINOPHEN 5-325 MG PO TABS: 1.0000 | ORAL_TABLET | Freq: Three times a day (TID) | ORAL | 0 refills | Status: DC | PRN

## 2016-10-07 NOTE — Telephone Encounter (Signed)
Spoke to patient, she is agreeable to Percocet pill. Will d/c narcotic solution. Ambien was also recently called into pharmacy so patient should not need a refill at this time.

## 2016-10-07 NOTE — Progress Notes (Signed)
Vascular and Vein Specialist of [facility]  [name] MRN: [id number] DOB: 1956-05-11 Sex: female  REASON FOR VISIT: Evaluate left leg venous ulcer  HPI: [name] is a 60 y.o. female well-known to me from treatment with right leg great saphenous vein ablation for severe venous stasis disease and venous ulcer on her right leg. Unfortunately she is now had development of a left venous ulcer on the lateral aspect. She is being treated for several months in the wound center and is having a difficult time with healing with this. This is extremely painful for her. Her right venous ulcer was quite large and is slowly healing. She has very nice granulating base on the right  Past Medical History:  Diagnosis Date  . Anemia   . Arthritis    knees  . Back pain 10/04/2012  . Blood in stool 10/07/2014  . Blood transfusion without reported diagnosis   . Bunion 03/28/2014  . 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 Degenerative arthritis of right shoulder region 08/2013  . Diverticulosis   . GERD (gastroesophageal reflux disease)   . High cholesterol   . History of GI bleed   . Hypertension    under control with meds., has been on med. > 20 yr.  . Lower GI bleed   . Medial meniscus tear 1997   Right Knee  . Mini stroke ([place])   . Rotator cuff rupture, complete 08/2013   right  . Shoulder impingement 08/2013   right  . Stroke [facility]) 2006   Remote left lacunar infarct noted on CT head 2006, 2010   . Ulcer ([place])   . Wears dentures    upper  . Wears partial dentures    lower    Family History  Problem Relation Age of Onset  . Cancer Father 72    Died from complications of colon CA  . Colon cancer Father     died in his 72s  . Diabetes Mother   . Hypertension Mother   . Diabetes Sister   . Heart disease Sister   . Diabetes Sister   . Stroke Sister   . Diabetes Brother   .  Stroke Brother   . Diabetes Brother   . Diabetes Brother   . Diabetes Brother     SOCIAL HISTORY: Social History  Substance Use Topics  . Smoking status: Never Smoker  . Smokeless tobacco: Never Used  . Alcohol use 0.0 oz/week     Comment: occasional wine    Allergies  Allergen Reactions  . Aspirin Other (See Comments)    CAN NOT TAKE DUE TO GI BLEED GI BLEED  . Nsaids Other (See Comments)    GI BLEED  . Tolmetin Other (See Comments)    GI BLEED    Current Outpatient Prescriptions  Medication Sig Dispense Refill  . aspirin 81 MG tablet Take 81 mg by mouth daily.    Marland Kitchen atorvastatin (LIPITOR) 40 MG tablet TAKE ONE TABLET EACH DAY 90 tablet 1  . calcium carbonate (OS-CAL) 600 MG TABS tablet Take 1 tablet (600 mg total) by mouth 2 (two) times daily with a meal. 180 tablet 1  . cetirizine (ZYRTEC) 10 MG tablet Take 1 tablet (10 mg total) by mouth daily. (Patient taking differently: Take 10 mg by mouth daily as needed for allergies. ) 30 tablet 11  . Cholecalciferol (D3 MAXIMUM STRENGTH) 5000 units capsule  Take 5,000 Units by mouth daily.    Marland Kitchen docusate sodium (DOK) 100 MG capsule TAKE ONE CAPSULE TWICE A DAY AS NEEDED FOR CONSTIPATION 180 capsule 1  . esomeprazole (NEXIUM) 40 MG capsule Take 1 tab every morning. 30 capsule 1  . ferrous sulfate 220 (44 FE) MG/5ML solution Take 5 mLs (220 mg total) by mouth 2 (two) times daily with a meal. 473 mL 2  . fluticasone (FLONASE) 50 MCG/ACT nasal spray Place 2 sprays into both nostrils at bedtime. (Patient taking differently: Place 2 sprays into both nostrils at bedtime as needed for allergies. ) 16 g 6  . hydrochlorothiazide (MICROZIDE) 12.5 MG capsule TAKE ONE CAPSULE EACH DAY 90 capsule 1  . lisinopril (PRINIVIL,ZESTRIL) 20 MG tablet Take 1 tablet (20 mg total) by mouth daily. 90 tablet 1  . Multiple Vitamin (MULTIVITAMIN) tablet Take 1 tablet by mouth daily. 90 tablet 1  . ondansetron (ZOFRAN) 4 MG tablet Take 1 tablet (4 mg total) by  mouth every 6 (six) hours as needed for nausea. 10 tablet 0  . sucralfate (CARAFATE) 1 GM/10ML suspension Take 10 mLs (1 g total) by mouth 4 (four) times daily -  with meals and at bedtime. 420 mL 1  . traMADol (ULTRAM) 50 MG tablet Take 1 tablet (50 mg total) by mouth every 8 (eight) hours as needed for moderate pain or severe pain. 60 tablet 1  . vitamin B-12 (CYANOCOBALAMIN) 1000 MCG tablet Take 1,000 mcg by mouth daily.    . vitamin C (ASCORBIC ACID) 250 MG tablet Take 250 mg by mouth daily.    . vitamin E 400 UNIT capsule Take 400 Units by mouth daily.    . Vitamins A & D 5000-400 units CAPS Take 5,000 Units by mouth daily.    Marland Kitchen zolpidem (AMBIEN) 10 MG tablet TAKE ONE TABLET AT BEDTIME AS NEEDED FOR SLEEP 30 tablet 2  . Hydrocodone-Acetaminophen 5-217 MG/10ML SOLN Take 10 mLs by mouth every 6 (six) hours as needed. (Patient not taking: Reported on 10/07/2016) 473 mL 0   No current facility-administered medications for this visit.     REVIEW OF SYSTEMS:  [X]  denotes positive finding, [ ]  denotes negative finding Cardiac  Comments:  Chest pain or chest pressure:    Shortness of breath upon exertion:    Short of breath when lying flat:    Irregular heart rhythm:        Vascular    Pain in calf, thigh, or hip brought on by ambulation:    Pain in feet at night that wakes you up from your sleep:     Blood clot in your veins:    Leg swelling:           PHYSICAL EXAM: Vitals:   10/07/16 0940  BP: 139/90  Pulse: 85  Resp: 16  Temp: 98.2 F (36.8 C)  SpO2: 98%  Weight: 175 lb (79.4 kg)  Height: 5\' 4"  (1.626 m)    GENERAL: The patient is a well-nourished female, in no acute distress. The vital signs are documented above. CARDIOVASCULAR: 2-3+ dorsalis pedis pulses bilaterally. She does have a large superficial granulating right lateral ulcer. On the left she has approximately 3-4 cm open ulcer with surrounding erythema and involvement of the fat. PULMONARY: There is good air  exchange  MUSCULOSKELETAL: There are no major deformities or cyanosis. NEUROLOGIC: No focal weakness or paresthesias are detected. SKIN: There are no ulcers or rashes noted. PSYCHIATRIC: The patient has a normal affect.  DATA:  Left leg venous duplex reveals deep and superficial venous incompetence with enlargement of her saphenous vein to her leg.  I imaged this area with SonoSite ultrasound as well. She does have enlargement and reflux in her great saphenous vein  MEDICAL ISSUES: Severe  venous ulceration with a CEAP class 6. She is not responding to aggressive conservative therapy. Have recommended laser ablation of her left great saphenous vein to reduce her superficial component of venous hypertension. We will proceed with this as soon as possible    Rosetta Posner, MD [facility] Vascular and Vein Specialists of [facility] Tel [phone] Pager [phone]

## 2016-10-11 DIAGNOSIS — S81801A Unspecified open wound, right lower leg, initial encounter: Secondary | ICD-10-CM | POA: Diagnosis not present

## 2016-10-12 DIAGNOSIS — I890 Lymphedema, not elsewhere classified: Secondary | ICD-10-CM | POA: Diagnosis not present

## 2016-10-12 DIAGNOSIS — L97212 Non-pressure chronic ulcer of right calf with fat layer exposed: Secondary | ICD-10-CM | POA: Diagnosis not present

## 2016-10-12 DIAGNOSIS — I87322 Chronic venous hypertension (idiopathic) with inflammation of left lower extremity: Secondary | ICD-10-CM | POA: Diagnosis not present

## 2016-10-12 DIAGNOSIS — I83012 Varicose veins of right lower extremity with ulcer of calf: Secondary | ICD-10-CM | POA: Diagnosis not present

## 2016-10-12 DIAGNOSIS — L97812 Non-pressure chronic ulcer of other part of right lower leg with fat layer exposed: Secondary | ICD-10-CM | POA: Diagnosis not present

## 2016-10-12 DIAGNOSIS — L97822 Non-pressure chronic ulcer of other part of left lower leg with fat layer exposed: Secondary | ICD-10-CM | POA: Diagnosis not present

## 2016-10-12 DIAGNOSIS — I10 Essential (primary) hypertension: Secondary | ICD-10-CM | POA: Diagnosis not present

## 2016-10-12 DIAGNOSIS — I87333 Chronic venous hypertension (idiopathic) with ulcer and inflammation of bilateral lower extremity: Secondary | ICD-10-CM | POA: Diagnosis not present

## 2016-10-13 ENCOUNTER — Ambulatory Visit: Payer: Self-pay

## 2016-10-13 ENCOUNTER — Other Ambulatory Visit: Payer: Self-pay | Admitting: *Deleted

## 2016-10-13 DIAGNOSIS — L97309 Non-pressure chronic ulcer of unspecified ankle with unspecified severity: Principal | ICD-10-CM

## 2016-10-13 DIAGNOSIS — I83023 Varicose veins of left lower extremity with ulcer of ankle: Secondary | ICD-10-CM

## 2016-10-13 DIAGNOSIS — L97329 Non-pressure chronic ulcer of left ankle with unspecified severity: Secondary | ICD-10-CM

## 2016-10-14 ENCOUNTER — Encounter: Payer: Self-pay | Admitting: Vascular Surgery

## 2016-10-19 ENCOUNTER — Other Ambulatory Visit: Payer: Self-pay | Admitting: Family Medicine

## 2016-10-19 DIAGNOSIS — I87322 Chronic venous hypertension (idiopathic) with inflammation of left lower extremity: Secondary | ICD-10-CM | POA: Diagnosis not present

## 2016-10-19 DIAGNOSIS — I87333 Chronic venous hypertension (idiopathic) with ulcer and inflammation of bilateral lower extremity: Secondary | ICD-10-CM | POA: Diagnosis not present

## 2016-10-19 DIAGNOSIS — I10 Essential (primary) hypertension: Secondary | ICD-10-CM | POA: Diagnosis not present

## 2016-10-19 DIAGNOSIS — L97822 Non-pressure chronic ulcer of other part of left lower leg with fat layer exposed: Secondary | ICD-10-CM | POA: Diagnosis not present

## 2016-10-19 DIAGNOSIS — I83012 Varicose veins of right lower extremity with ulcer of calf: Secondary | ICD-10-CM | POA: Diagnosis not present

## 2016-10-19 DIAGNOSIS — L97212 Non-pressure chronic ulcer of right calf with fat layer exposed: Secondary | ICD-10-CM | POA: Diagnosis not present

## 2016-10-19 DIAGNOSIS — L97812 Non-pressure chronic ulcer of other part of right lower leg with fat layer exposed: Secondary | ICD-10-CM | POA: Diagnosis not present

## 2016-10-19 DIAGNOSIS — I890 Lymphedema, not elsewhere classified: Secondary | ICD-10-CM | POA: Diagnosis not present

## 2016-10-19 DIAGNOSIS — L97222 Non-pressure chronic ulcer of left calf with fat layer exposed: Secondary | ICD-10-CM | POA: Diagnosis not present

## 2016-10-19 NOTE — Telephone Encounter (Signed)
RN staff - please call the patient and inform her that I am out of town for the holiday and will not return until next week, please offer her a same day appointment. If she is using more pain meds than she is prescribed then she needs an appointment regardless. Thanks .

## 2016-10-19 NOTE — Telephone Encounter (Signed)
Pt needs a refill on pain medication and would like for PCP to call her because she has been taking more due to the amount of pain pt is in. Please advise. Thanks! ep

## 2016-10-20 ENCOUNTER — Ambulatory Visit: Payer: Commercial Managed Care - HMO | Admitting: Internal Medicine

## 2016-10-20 ENCOUNTER — Ambulatory Visit (INDEPENDENT_AMBULATORY_CARE_PROVIDER_SITE_OTHER): Payer: Commercial Managed Care - HMO | Admitting: Student

## 2016-10-20 VITALS — BP 130/98 | HR 93 | Temp 98.3°F | Ht 64.0 in | Wt 175.8 lb

## 2016-10-20 DIAGNOSIS — I83023 Varicose veins of left lower extremity with ulcer of ankle: Secondary | ICD-10-CM

## 2016-10-20 DIAGNOSIS — M79605 Pain in left leg: Secondary | ICD-10-CM | POA: Diagnosis not present

## 2016-10-20 DIAGNOSIS — L97919 Non-pressure chronic ulcer of unspecified part of right lower leg with unspecified severity: Secondary | ICD-10-CM | POA: Insufficient documentation

## 2016-10-20 DIAGNOSIS — L97929 Non-pressure chronic ulcer of unspecified part of left lower leg with unspecified severity: Secondary | ICD-10-CM | POA: Diagnosis not present

## 2016-10-20 DIAGNOSIS — L97322 Non-pressure chronic ulcer of left ankle with fat layer exposed: Secondary | ICD-10-CM

## 2016-10-20 HISTORY — DX: Non-pressure chronic ulcer of left ankle with fat layer exposed: L97.322

## 2016-10-20 HISTORY — DX: Varicose veins of left lower extremity with ulcer of ankle: I83.023

## 2016-10-20 HISTORY — DX: Non-pressure chronic ulcer of unspecified part of right lower leg with unspecified severity: L97.919

## 2016-10-20 MED ORDER — OXYCODONE-ACETAMINOPHEN 5-325 MG PO TABS: 1.0000 | ORAL_TABLET | Freq: Three times a day (TID) | ORAL | 0 refills | Status: DC | PRN

## 2016-10-20 NOTE — Patient Instructions (Signed)
It was great seeing you today! We have addressed the following issues today  1. Leg pain/ulcer: I have refilled you prescription for percocet. I recommend taking one tablets every 8 hours only as needed for severe pain.    If we did any lab work today, and the results require attention, either me or my nurse will get in touch with you. If everything is normal, you will get a letter in mail. If you don't hear from [place] in two weeks, please give [place] a call. Otherwise, we look forward to seeing you again at your next visit. If you have any questions or concerns before then, please call the clinic at [phone].   Please bring all your medications to every doctors visit   Sign up for My Chart to have easy access to your labs results, and communication with your Primary care physician.     Please check-out at the front desk before leaving the clinic.       Take Care,

## 2016-10-20 NOTE — Assessment & Plan Note (Addendum)
A clean-looking wound with no surrounding erythema or increased warmth. She has no constitutional symptoms. Patient is followed at wound clinic and does daily dressing herself. She has vascular ablation for venous insufficiency scheduled with vascular surgery tomorrow. This is done under local anesthesia per patient. I refilled her Percocet today, #60. Advised her to take 1 tablet every 8 hours as needed for severe pain. Discussed return precautions. Discussed patient with Dr. Andria Frames who was the preceptor for the day

## 2016-10-20 NOTE — Progress Notes (Signed)
Subjective:    [name] is a 60 y.o. old female here on same day clinic to discuss leg pain.  HPI Leg pain: Reports pain in left leg for 4-5 months. She was on tramadol in the past. Recently she was switched to percocet by PCP. She reports taking 2 tablets every 8 hours. Her last dose on percocet was this morning. She says percocet is helping. She denies fall or drowsiness. She is no longer on tramadol.   Patient with bilateral leg ulcer secondary to venous insufficiently. She is going to surgery tomorrow. She says they are going to "burn" the blood vessels. She says they are going to do the surgery under local anesthesia. She goes wound clinic every week. Last visit was yesterday. She has a graft on right leg. She does the dressing of her left leg every day. She says the wound is getting bigger. Denies fever, chills, chest pain, shortness of breath, recent travels, history of blood clot.  She has history of DM as well.   Review of Systems Per HPI History and Problem List: [name] has H/O type 2 diabetes mellitus; OBESITY, NOS; Iron deficiency anemia; HTN, goal below 140/90; Venous (peripheral) insufficiency; Allergic rhinitis; CYST, KIDNEY, ACQUIRED; LIBIDO, DECREASED; CEREBROVASCULAR ACCIDENT, HX OF; POSTMENOPAUSAL STATUS; Hypopigmented skin lesion; Diverticulosis of colon with hemorrhage; Asthma, cough variant; Dysuria; S/P gastric bypass; GERD (gastroesophageal reflux disease); Insomnia; External hemorrhoids without complication; Subacromial bursitis; Eczema; Constipation; Biliary sludge; Muscle cramps; Pain in joint, shoulder region; Diverticulosis of colon without hemorrhage; Chest pain; Tension type headache; Muscle pain; S/P laparoscopic colectomy; Premature supraventricular beats; Vision changes; Superficial thrombophlebitis; Leg swelling; Hypoalbuminemia; Cramping of hands; Osteoarthritis of both knees; Cervical pain (neck); Lumbar back pain; DDD (degenerative disc disease), lumbar; Skin lesion  of right lower extremity; Healthcare maintenance; Cellulitis of leg, left; Acute renal failure ([place]); Intractable vomiting; Malnutrition of moderate degree; and Ulcer of left lower extremity ([place]) on her problem list.  [name]  has a past medical history of Anemia; Arthritis; Back pain (10/04/2012); Blood in stool (10/07/2014); Blood transfusion without reported diagnosis; Bunion (03/28/2014); DE QUERVAIN'S TENOSYNOVITIS (11/30/2010); Degenerative arthritis of right shoulder region (08/2013); Diverticulosis; GERD (gastroesophageal reflux disease); High cholesterol; History of GI bleed; Hypertension; Lower GI bleed; Medial meniscus tear (1997); Mini stroke ([place]); Rotator cuff rupture, complete (08/2013); Shoulder impingement (08/2013); Stroke [facility]) (2006); Ulcer ([place]); Wears dentures; and Wears partial dentures.     Objective:    BP (!) 130/98 (BP Location: Left Arm, Patient Position: Sitting, Cuff Size: Normal)   Pulse 93   Temp 98.3 F (36.8 C) (Oral)   Ht 5\' 4"  (1.626 m)   Wt 175 lb 12.8 oz (79.7 kg)   SpO2 99%   BMI 30.18 kg/m  Physical Exam GEN: appears well, no apparent distress. CVS: RRR, normal s1 and s2, no murmurs, 1+ edema bilaterally, cap refills < 2 secs RESP: no increased work of breathing, good air movement bilaterally MSK: Bilateral swelling in lower extremities. SKIN: Dry scaly skin in lower extremity, circular area of the skin lesion over the lateral aspect of her right lower leg about 2 inch in diameter, appears clean with granulation tissue, no surrounding erythema, some tenderness to palpation. (See picture below for more). Right leg was not evaluated as patient has a graft placed.     NEURO: alertr and oiented appropriately, no gross defecits  PSYCH: appropriate mood and affect     Assessment and Plan:   Problem List Items Addressed This Visit  Other   Ulcer of left lower extremity ([place]) - Primary    A clean-looking wound with no surrounding erythema or  increased warmth. She has no constitutional symptoms. Patient is followed at wound clinic and does daily dressing herself. She has vascular ablation for venous insufficiency scheduled with vascular surgery tomorrow. This is done under local anesthesia per patient. I refilled her Percocet today, #60. Advised her to take 1 tablet every 8 hours as needed for severe pain. Discussed return precautions. Discussed patient with Dr. Andria Frames who was the preceptor for the day        Return if symptoms worsen or fail to improve.  Mercy Riding, MD

## 2016-10-20 NOTE — Telephone Encounter (Signed)
Spoke with patient, scheduled appointment for today. Nat Christen, CMA

## 2016-10-21 ENCOUNTER — Encounter: Payer: Self-pay | Admitting: *Deleted

## 2016-10-21 ENCOUNTER — Ambulatory Visit (INDEPENDENT_AMBULATORY_CARE_PROVIDER_SITE_OTHER): Payer: Commercial Managed Care - HMO | Admitting: Vascular Surgery

## 2016-10-21 ENCOUNTER — Other Ambulatory Visit: Payer: Self-pay | Admitting: *Deleted

## 2016-10-21 ENCOUNTER — Encounter: Payer: Self-pay | Admitting: Vascular Surgery

## 2016-10-21 VITALS — BP 133/88 | HR 84 | Temp 98.3°F | Resp 16 | Ht 64.0 in | Wt 173.0 lb

## 2016-10-21 DIAGNOSIS — I83023 Varicose veins of left lower extremity with ulcer of ankle: Secondary | ICD-10-CM

## 2016-10-21 DIAGNOSIS — L97322 Non-pressure chronic ulcer of left ankle with fat layer exposed: Secondary | ICD-10-CM

## 2016-10-21 HISTORY — PX: ENDOVENOUS ABLATION SAPHENOUS VEIN W/ LASER: SUR449

## 2016-10-21 NOTE — Progress Notes (Signed)
     Laser Ablation Procedure    Date: 10/21/2016   [name] DOB:[birth year]  Consent signed: Yes    Surgeon:  Dr. Sherren Mocha Early  Procedure: Laser Ablation: left Greater Saphenous Vein  BP 133/88 (BP Location: Left Arm, Patient Position: Sitting, Cuff Size: Large)   Pulse 84   Temp 98.3 F (36.8 C) (Oral)   Resp 16   Ht 5\' 4"  (1.626 m)   Wt 173 lb (78.5 kg)   SpO2 100%   BMI 29.70 kg/m   Tumescent Anesthesia: 500 cc 0.9% NaCl with 50 cc Lidocaine HCL with 1% Epi and 15 cc 8.4% NaHCO3  Local Anesthesia: 5 cc Lidocaine HCL and NaHCO3 (ratio 2:1)  15 watts continuous mode        Total energy: 2342 Joules   Total time: 2:36      Patient tolerated procedure well    Description of Procedure:  After marking the course of the secondary varicosities, the patient was placed on the operating table in the supine position, and the left leg was prepped and draped in sterile fashion.   Local anesthetic was administered and under ultrasound guidance the saphenous vein was accessed with a micro needle and guide wire; then the mirco puncture sheath was placed.  A guide wire was inserted saphenofemoral junction , followed by a 5 french sheath.  The position of the sheath and then the laser fiber below the junction was confirmed using the ultrasound.  Tumescent anesthesia was administered along the course of the saphenous vein using ultrasound guidance. The patient was placed in Trendelenburg position and protective laser glasses were placed on patient and staff, and the laser was fired at 15 watts continuous mode advancing 1-36mm/second for a total of 2342 joules.   Steri strip was applied  and ABD pads and thigh high compression stockings were applied.  Ace wrap bandages were applied  at the top of the saphenofemoral junction. Blood loss was less than 15 cc.  The patient ambulated out of the operating room having tolerated the procedure well.  Uneventful ablation from mid calf to just below  saphenofemoral junction. Follow-up with venous duplex in one week

## 2016-10-21 NOTE — Patient Outreach (Signed)
[facility]) Care Management  10/21/2016  [name] [birth year] [id number]   Outreach letter sent on 12/4, no response.  Will send member and primary MD case closure letters.  Valente David, [place], MSN [place] [phone]

## 2016-10-22 ENCOUNTER — Encounter: Payer: Self-pay | Admitting: Vascular Surgery

## 2016-10-26 DIAGNOSIS — I10 Essential (primary) hypertension: Secondary | ICD-10-CM | POA: Diagnosis not present

## 2016-10-26 DIAGNOSIS — L97225 Non-pressure chronic ulcer of left calf with muscle involvement without evidence of necrosis: Secondary | ICD-10-CM | POA: Diagnosis not present

## 2016-10-26 DIAGNOSIS — I87313 Chronic venous hypertension (idiopathic) with ulcer of bilateral lower extremity: Secondary | ICD-10-CM | POA: Diagnosis not present

## 2016-10-26 DIAGNOSIS — L97812 Non-pressure chronic ulcer of other part of right lower leg with fat layer exposed: Secondary | ICD-10-CM | POA: Diagnosis not present

## 2016-10-26 DIAGNOSIS — I87331 Chronic venous hypertension (idiopathic) with ulcer and inflammation of right lower extremity: Secondary | ICD-10-CM | POA: Diagnosis not present

## 2016-10-26 DIAGNOSIS — L97212 Non-pressure chronic ulcer of right calf with fat layer exposed: Secondary | ICD-10-CM | POA: Diagnosis not present

## 2016-10-26 DIAGNOSIS — L97822 Non-pressure chronic ulcer of other part of left lower leg with fat layer exposed: Secondary | ICD-10-CM | POA: Diagnosis not present

## 2016-10-26 DIAGNOSIS — I890 Lymphedema, not elsewhere classified: Secondary | ICD-10-CM | POA: Diagnosis not present

## 2016-10-26 DIAGNOSIS — I83012 Varicose veins of right lower extremity with ulcer of calf: Secondary | ICD-10-CM | POA: Diagnosis not present

## 2016-10-26 DIAGNOSIS — I87333 Chronic venous hypertension (idiopathic) with ulcer and inflammation of bilateral lower extremity: Secondary | ICD-10-CM | POA: Diagnosis not present

## 2016-10-27 DIAGNOSIS — S81801A Unspecified open wound, right lower leg, initial encounter: Secondary | ICD-10-CM | POA: Diagnosis not present

## 2016-10-29 ENCOUNTER — Ambulatory Visit (HOSPITAL_COMMUNITY)
Admission: RE | Admit: 2016-10-29 | Discharge: 2016-10-29 | Disposition: A | Discharge status: Home / Self Care | Payer: Commercial Managed Care - HMO | Source: Ambulatory Visit | Attending: Vascular Surgery | Admitting: Vascular Surgery

## 2016-10-29 DIAGNOSIS — L97329 Non-pressure chronic ulcer of left ankle with unspecified severity: Secondary | ICD-10-CM

## 2016-10-29 DIAGNOSIS — L97309 Non-pressure chronic ulcer of unspecified ankle with unspecified severity: Secondary | ICD-10-CM

## 2016-10-29 DIAGNOSIS — I83023 Varicose veins of left lower extremity with ulcer of ankle: Secondary | ICD-10-CM | POA: Insufficient documentation

## 2016-11-01 DIAGNOSIS — H9190 Unspecified hearing loss, unspecified ear: Secondary | ICD-10-CM | POA: Insufficient documentation

## 2016-11-02 ENCOUNTER — Encounter: Payer: Commercial Managed Care - HMO | Admitting: Vascular Surgery

## 2016-11-02 ENCOUNTER — Encounter (HOSPITAL_COMMUNITY): Payer: Commercial Managed Care - HMO

## 2016-11-02 ENCOUNTER — Encounter (HOSPITAL_BASED_OUTPATIENT_CLINIC_OR_DEPARTMENT_OTHER): Payer: Medicare HMO | Attending: Surgery

## 2016-11-02 DIAGNOSIS — I87333 Chronic venous hypertension (idiopathic) with ulcer and inflammation of bilateral lower extremity: Secondary | ICD-10-CM | POA: Insufficient documentation

## 2016-11-02 DIAGNOSIS — L97822 Non-pressure chronic ulcer of other part of left lower leg with fat layer exposed: Secondary | ICD-10-CM | POA: Insufficient documentation

## 2016-11-02 DIAGNOSIS — I83012 Varicose veins of right lower extremity with ulcer of calf: Secondary | ICD-10-CM | POA: Diagnosis not present

## 2016-11-02 DIAGNOSIS — L97812 Non-pressure chronic ulcer of other part of right lower leg with fat layer exposed: Secondary | ICD-10-CM | POA: Diagnosis not present

## 2016-11-02 DIAGNOSIS — I890 Lymphedema, not elsewhere classified: Secondary | ICD-10-CM | POA: Diagnosis not present

## 2016-11-02 DIAGNOSIS — L97212 Non-pressure chronic ulcer of right calf with fat layer exposed: Secondary | ICD-10-CM | POA: Diagnosis not present

## 2016-11-04 ENCOUNTER — Encounter: Payer: Self-pay | Admitting: Vascular Surgery

## 2016-11-04 ENCOUNTER — Ambulatory Visit (INDEPENDENT_AMBULATORY_CARE_PROVIDER_SITE_OTHER): Payer: Commercial Managed Care - HMO | Admitting: Vascular Surgery

## 2016-11-04 VITALS — BP 153/105 | HR 83 | Temp 97.5°F | Resp 20 | Ht 64.0 in | Wt 178.3 lb

## 2016-11-04 DIAGNOSIS — I83023 Varicose veins of left lower extremity with ulcer of ankle: Secondary | ICD-10-CM | POA: Diagnosis not present

## 2016-11-04 DIAGNOSIS — L97322 Non-pressure chronic ulcer of left ankle with fat layer exposed: Secondary | ICD-10-CM

## 2016-11-04 NOTE — Progress Notes (Signed)
Vascular and Vein Specialist of [facility]  [name] MRN: [id number] DOB: Apr 04, 1956 Sex: female  REASON FOR VISIT: Follow-up laser ablation left great saphenous vein on 10/21/2016  HPI: [name] is a 61 y.o. female today for follow-up. She did have a similar ablation in her right leg approximately one month prior. She reports mild discomfort associated with her left leg ablation. She is continuing to have local wound care to of both her right and left ankle ulcers at the wound center. She reports decreased discomfort in her left ankle ulcer following the ablation  Past Medical History:  Diagnosis Date  . Anemia   . Arthritis    knees  . Back pain 10/04/2012  . Blood in stool 10/07/2014  . Blood transfusion without reported diagnosis   . Bunion 03/28/2014  . 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 Degenerative arthritis of right shoulder region 08/2013  . Diverticulosis   . GERD (gastroesophageal reflux disease)   . High cholesterol   . History of GI bleed   . Hypertension    under control with meds., has been on med. > 20 yr.  . Lower GI bleed   . Medial meniscus tear 1997   Right Knee  . Mini stroke ([place])   . Rotator cuff rupture, complete 08/2013   right  . Shoulder impingement 08/2013   right  . Stroke [facility]) 2006   Remote left lacunar infarct noted on CT head 2006, 2010   . Ulcer ([place])   . Wears dentures    upper  . Wears partial dentures    lower    Family History  Problem Relation Age of Onset  . Cancer Father 39    Died from complications of colon CA  . Colon cancer Father     died in his 45s  . Diabetes Mother   . Hypertension Mother   . Diabetes Sister   . Heart disease Sister   . Diabetes Sister   . Stroke Sister   . Diabetes Brother   . Stroke Brother   . Diabetes Brother   . Diabetes Brother   . Diabetes Brother     SOCIAL  HISTORY: Social History  Substance Use Topics  . Smoking status: Never Smoker  . Smokeless tobacco: Never Used  . Alcohol use 0.0 oz/week     Comment: occasional wine    Allergies  Allergen Reactions  . Aspirin Other (See Comments)    CAN NOT TAKE DUE TO GI BLEED GI BLEED  . Nsaids Other (See Comments)    GI BLEED  . Tolmetin Other (See Comments)    GI BLEED    Current Outpatient Prescriptions  Medication Sig Dispense Refill  . aspirin 81 MG tablet Take 81 mg by mouth daily.    Marland Kitchen atorvastatin (LIPITOR) 40 MG tablet TAKE ONE TABLET EACH DAY 90 tablet 1  . calcium carbonate (OS-CAL) 600 MG TABS tablet Take 1 tablet (600 mg total) by mouth 2 (two) times daily with a meal. 180 tablet 1  . cetirizine (ZYRTEC) 10 MG tablet Take 1 tablet (10 mg total) by mouth daily. (Patient taking differently: Take 10 mg by mouth daily as needed for allergies. ) 30 tablet 11  . Cholecalciferol (D3 MAXIMUM STRENGTH) 5000 units capsule Take 5,000 Units by mouth daily.    Marland Kitchen docusate sodium (DOK) 100 MG capsule TAKE ONE CAPSULE TWICE A DAY  AS NEEDED FOR CONSTIPATION 180 capsule 1  . esomeprazole (NEXIUM) 40 MG capsule Take 1 tab every morning. 30 capsule 1  . ferrous sulfate 220 (44 FE) MG/5ML solution Take 5 mLs (220 mg total) by mouth 2 (two) times daily with a meal. 473 mL 2  . fluticasone (FLONASE) 50 MCG/ACT nasal spray Place 2 sprays into both nostrils at bedtime. (Patient taking differently: Place 2 sprays into both nostrils at bedtime as needed for allergies. ) 16 g 6  . hydrochlorothiazide (MICROZIDE) 12.5 MG capsule TAKE ONE CAPSULE EACH DAY 90 capsule 1  . lisinopril (PRINIVIL,ZESTRIL) 20 MG tablet Take 1 tablet (20 mg total) by mouth daily. 90 tablet 1  . Multiple Vitamin (MULTIVITAMIN) tablet Take 1 tablet by mouth daily. 90 tablet 1  . ondansetron (ZOFRAN) 4 MG tablet Take 1 tablet (4 mg total) by mouth every 6 (six) hours as needed for nausea. 10 tablet 0  . oxyCODONE-acetaminophen (ROXICET)  5-325 MG tablet Take 1 tablet by mouth every 8 (eight) hours as needed for severe pain. 60 tablet 0  . SANTYL ointment     . sucralfate (CARAFATE) 1 GM/10ML suspension Take 10 mLs (1 g total) by mouth 4 (four) times daily -  with meals and at bedtime. 420 mL 1  . vitamin B-12 (CYANOCOBALAMIN) 1000 MCG tablet Take 1,000 mcg by mouth daily.    . vitamin C (ASCORBIC ACID) 250 MG tablet Take 250 mg by mouth daily.    . vitamin E 400 UNIT capsule Take 400 Units by mouth daily.    . Vitamins A & D 5000-400 units CAPS Take 5,000 Units by mouth daily.    Marland Kitchen zolpidem (AMBIEN) 10 MG tablet TAKE ONE TABLET AT BEDTIME AS NEEDED FOR SLEEP 30 tablet 2   No current facility-administered medications for this visit.     REVIEW OF SYSTEMS:  [X]  denotes positive finding, [ ]  denotes negative finding Cardiac  Comments:  Chest pain or chest pressure:    Shortness of breath upon exertion:    Short of breath when lying flat:    Irregular heart rhythm:        Vascular    Pain in calf, thigh, or hip brought on by ambulation:    Pain in feet at night that wakes you up from your sleep:     Blood clot in your veins:    Leg swelling:           PHYSICAL EXAM: Vitals:   11/04/16 0944 11/04/16 0948  BP: (!) 170/98 (!) 153/105  Pulse: 83   Resp: 20   Temp: 97.5 F (36.4 C)   TempSrc: Oral   SpO2: 99%   Weight: 178 lb 4.8 oz (80.9 kg)   Height: 5\' 4"  (1.626 m)     GENERAL: The patient is a well-nourished female, in no acute distress. The vital signs are documented above. CARDIOVASCULAR: Palpable thrombosed saphenous vein at the insertion site below the knee on the left PULMONARY: There is good air exchange  MUSCULOSKELETAL: There are no major deformities or cyanosis. NEUROLOGIC: No focal weakness or paresthesias are detected. SKIN:Dressings intact to both legs from the proximal calf distally PSYCHIATRIC: The patient has a normal affect.  DATA:  I did review her venous duplex with patient from  10/29/2016. This shows closure of her great saphenous vein from the distal insertion site in the proximal calf to 2.9 cm below the saphenofemoral junction with no DVT  MEDICAL ISSUES: Successful staged bilateral ablation of  her great saphenous vein for severe venous hypertension. She will continue her treatment at the wound center. Will see [place] on an as-needed basis    Rosetta Posner, MD [facility] Vascular and Vein Specialists of [facility] Tel [phone] Pager [phone]

## 2016-11-04 NOTE — Progress Notes (Signed)
Vitals:   11/04/16 0944 11/04/16 0948  BP: (!) 170/98 (!) 153/105  Pulse: 83   Resp: 20   Temp: 97.5 F (36.4 C)   TempSrc: Oral   SpO2: 99%   Weight: 178 lb 4.8 oz (80.9 kg)   Height: 5\' 4"  (1.626 m)

## 2016-11-05 DIAGNOSIS — S81801A Unspecified open wound, right lower leg, initial encounter: Secondary | ICD-10-CM | POA: Diagnosis not present

## 2016-11-08 ENCOUNTER — Telehealth: Payer: Self-pay | Admitting: *Deleted

## 2016-11-08 NOTE — Telephone Encounter (Signed)
Pt calling stating that she needs some pain medicine for her legs, she is in pain and they are swollen. Wants dr Ree Kida to call her. Deseree Kennon Holter, CMA

## 2016-11-09 DIAGNOSIS — I890 Lymphedema, not elsewhere classified: Secondary | ICD-10-CM | POA: Diagnosis not present

## 2016-11-09 DIAGNOSIS — L97812 Non-pressure chronic ulcer of other part of right lower leg with fat layer exposed: Secondary | ICD-10-CM | POA: Diagnosis not present

## 2016-11-09 DIAGNOSIS — L97212 Non-pressure chronic ulcer of right calf with fat layer exposed: Secondary | ICD-10-CM | POA: Diagnosis not present

## 2016-11-09 DIAGNOSIS — I83012 Varicose veins of right lower extremity with ulcer of calf: Secondary | ICD-10-CM | POA: Diagnosis not present

## 2016-11-09 DIAGNOSIS — L97822 Non-pressure chronic ulcer of other part of left lower leg with fat layer exposed: Secondary | ICD-10-CM | POA: Diagnosis not present

## 2016-11-09 DIAGNOSIS — I87312 Chronic venous hypertension (idiopathic) with ulcer of left lower extremity: Secondary | ICD-10-CM | POA: Diagnosis not present

## 2016-11-09 DIAGNOSIS — S81801A Unspecified open wound, right lower leg, initial encounter: Secondary | ICD-10-CM | POA: Diagnosis not present

## 2016-11-09 DIAGNOSIS — L97222 Non-pressure chronic ulcer of left calf with fat layer exposed: Secondary | ICD-10-CM | POA: Diagnosis not present

## 2016-11-09 DIAGNOSIS — I87333 Chronic venous hypertension (idiopathic) with ulcer and inflammation of bilateral lower extremity: Secondary | ICD-10-CM | POA: Diagnosis not present

## 2016-11-09 MED ORDER — FUROSEMIDE 20 MG PO TABS: 20.0000 mg | ORAL_TABLET | Freq: Every day | ORAL | 0 refills | Status: DC

## 2016-11-09 MED ORDER — OXYCODONE-ACETAMINOPHEN 5-325 MG PO TABS: 1.0000 | ORAL_TABLET | Freq: Three times a day (TID) | ORAL | 0 refills | Status: DC | PRN

## 2016-11-09 NOTE — Telephone Encounter (Signed)
Patient reports continued pain/swelling in legs. Seen by vascular today who thought she could benefit from fluid pill. Patient also out of narcotic pain medication. Has follow up scheduled in 2 days with me in office. Will start lasix 20 mg daily and refill of roxicet provided.

## 2016-11-10 NOTE — Progress Notes (Signed)
   Subjective:    Patient ID: [name], female    DOB: 05/04/1956, 61 y.o.   MRN: [id number]  HPI 61 y/o female presents for follow up of leg swelling/pain.  Leg swelling/Venous Insufficiency S/P right vein ablation 1 month ago and s/p left vein ablation on 10/22/16. Seen by Vascular Surgery on 11/05/15 (reviewed note). Patient contacted the office a few days ago reporting continued leg swelling and pain. Refill of Roxicet given (1/9) - taking 2 tablets twice daily. Also started on Lasix 20 mg daily. Seen at wound care once per week. Recently had skin graft to right leg, plans to get skin graft of left leg soon  HTN Currently prescribed HCTZ 12.5 mg daily and Lisinopril 20 mg daily  HM Due for Eye Exam, Zostavax, Mammogram (scheduled), Lipid Profile, and Hemoglobin A1C  Social - nonsmoker   Review of Systems  Constitutional: Negative for chills and fatigue.  Respiratory: Negative for shortness of breath.   Cardiovascular: Negative for chest pain.  Gastrointestinal: Negative for diarrhea, nausea and vomiting.       Objective:   Physical Exam BP 130/78   Pulse 95   Temp 98.8 F (37.1 C) (Oral)   Ht 5\' 4"  (1.626 m)   Wt 180 lb 3.2 oz (81.7 kg)   BMI 30.93 kg/m   Gen: well appearing female, NAD Cardiac: RRR, S1 and S2 present, no murmur Resp: CTAB, normal effort Ext: 2+ edema to knees bilaterally, large wounds/ulcerations bilaterally (see photos below). No evidence of active infection (no warmth, drainage).    Left Lateral Leg    Right Lateral Leg     Assessment & Plan:  Venous (peripheral) insufficiency Venous Insufficiency s/p vein ablation bilaterally. Uncontrolled.  -increase lasix to 40 mg daily -continue compression stockings  HTN, goal below 140/90 Controlled. Continue HCTZ 12.5 mg daily and Lisinopril 20 mg daily.   Multiple open wounds of lower leg Bilateral open wounds (stage 2-3) related to venous insufficiency.  -continue outpatient wound  care -increase narcotic prescription to Oxycodone 10 mg Q6 prn

## 2016-11-12 ENCOUNTER — Telehealth: Payer: Self-pay | Admitting: Family Medicine

## 2016-11-12 ENCOUNTER — Ambulatory Visit (INDEPENDENT_AMBULATORY_CARE_PROVIDER_SITE_OTHER): Payer: Commercial Managed Care - HMO | Admitting: Family Medicine

## 2016-11-12 ENCOUNTER — Encounter: Payer: Self-pay | Admitting: Family Medicine

## 2016-11-12 ENCOUNTER — Other Ambulatory Visit: Payer: Self-pay | Admitting: Family Medicine

## 2016-11-12 DIAGNOSIS — S81809A Unspecified open wound, unspecified lower leg, initial encounter: Secondary | ICD-10-CM | POA: Insufficient documentation

## 2016-11-12 DIAGNOSIS — Z1231 Encounter for screening mammogram for malignant neoplasm of breast: Secondary | ICD-10-CM

## 2016-11-12 DIAGNOSIS — Z8639 Personal history of other endocrine, nutritional and metabolic disease: Secondary | ICD-10-CM | POA: Diagnosis not present

## 2016-11-12 DIAGNOSIS — S81809D Unspecified open wound, unspecified lower leg, subsequent encounter: Secondary | ICD-10-CM

## 2016-11-12 DIAGNOSIS — I872 Venous insufficiency (chronic) (peripheral): Secondary | ICD-10-CM

## 2016-11-12 DIAGNOSIS — I10 Essential (primary) hypertension: Secondary | ICD-10-CM | POA: Diagnosis not present

## 2016-11-12 HISTORY — DX: Unspecified open wound, unspecified lower leg, initial encounter: S81.809A

## 2016-11-12 MED ORDER — FUROSEMIDE 40 MG PO TABS: 40.0000 mg | ORAL_TABLET | Freq: Every day | ORAL | 1 refills | Status: DC

## 2016-11-12 MED ORDER — FUROSEMIDE 40 MG PO TABS: 20.0000 mg | ORAL_TABLET | Freq: Every day | ORAL | 1 refills | Status: DC

## 2016-11-12 MED ORDER — OXYCODONE HCL 10 MG PO TABS: 10.0000 mg | ORAL_TABLET | Freq: Four times a day (QID) | ORAL | 0 refills | Status: DC | PRN

## 2016-11-12 NOTE — Patient Instructions (Addendum)
It was nice to see you today.  You are due for some lab work. However our lab can unfortunately not draw labs for [facility] at this time. Please call back in 2 weeks to get a lab appointment for your cholesterol and hemoblobin A1C.   You are also due for you eye exam. Please get this completed as soon as possible and have them send records to my office.   Blood pressure - well controlled, keep taking HCTZ and Lisinopril  Leg Swelling - increase lasix to 40 mg daily  Pain - start Oxycodone 10 mg every 6 hours as needed.   Return in 2 weeks.

## 2016-11-12 NOTE — Assessment & Plan Note (Signed)
Bilateral open wounds (stage 2-3) related to venous insufficiency.  -continue outpatient wound care -increase narcotic prescription to Oxycodone 10 mg Q6 prn

## 2016-11-12 NOTE — Assessment & Plan Note (Signed)
Controlled. Continue HCTZ 12.5 mg daily and Lisinopril 20 mg daily.

## 2016-11-12 NOTE — Telephone Encounter (Signed)
Needs refill on her sleeping pills.  [employer]

## 2016-11-12 NOTE — Assessment & Plan Note (Signed)
Venous Insufficiency s/p vein ablation bilaterally. Uncontrolled.  -increase lasix to 40 mg daily -continue compression stockings

## 2016-11-16 DIAGNOSIS — I87333 Chronic venous hypertension (idiopathic) with ulcer and inflammation of bilateral lower extremity: Secondary | ICD-10-CM | POA: Diagnosis not present

## 2016-11-16 DIAGNOSIS — L97212 Non-pressure chronic ulcer of right calf with fat layer exposed: Secondary | ICD-10-CM | POA: Diagnosis not present

## 2016-11-16 DIAGNOSIS — L97822 Non-pressure chronic ulcer of other part of left lower leg with fat layer exposed: Secondary | ICD-10-CM | POA: Diagnosis not present

## 2016-11-16 DIAGNOSIS — I87311 Chronic venous hypertension (idiopathic) with ulcer of right lower extremity: Secondary | ICD-10-CM | POA: Diagnosis not present

## 2016-11-16 DIAGNOSIS — L97812 Non-pressure chronic ulcer of other part of right lower leg with fat layer exposed: Secondary | ICD-10-CM | POA: Diagnosis not present

## 2016-11-23 DIAGNOSIS — L97222 Non-pressure chronic ulcer of left calf with fat layer exposed: Secondary | ICD-10-CM | POA: Diagnosis not present

## 2016-11-23 DIAGNOSIS — L97812 Non-pressure chronic ulcer of other part of right lower leg with fat layer exposed: Secondary | ICD-10-CM | POA: Diagnosis not present

## 2016-11-23 DIAGNOSIS — L97822 Non-pressure chronic ulcer of other part of left lower leg with fat layer exposed: Secondary | ICD-10-CM | POA: Diagnosis not present

## 2016-11-23 DIAGNOSIS — I87312 Chronic venous hypertension (idiopathic) with ulcer of left lower extremity: Secondary | ICD-10-CM | POA: Diagnosis not present

## 2016-11-23 DIAGNOSIS — I87333 Chronic venous hypertension (idiopathic) with ulcer and inflammation of bilateral lower extremity: Secondary | ICD-10-CM | POA: Diagnosis not present

## 2016-11-24 DIAGNOSIS — S81801A Unspecified open wound, right lower leg, initial encounter: Secondary | ICD-10-CM | POA: Diagnosis not present

## 2016-11-25 ENCOUNTER — Ambulatory Visit
Admission: RE | Admit: 2016-11-25 | Discharge: 2016-11-25 | Disposition: A | Discharge status: Home / Self Care | Payer: Medicare HMO | Source: Ambulatory Visit | Attending: Family Medicine | Admitting: Family Medicine

## 2016-11-25 DIAGNOSIS — Z1231 Encounter for screening mammogram for malignant neoplasm of breast: Secondary | ICD-10-CM

## 2016-11-25 DIAGNOSIS — S81801A Unspecified open wound, right lower leg, initial encounter: Secondary | ICD-10-CM | POA: Diagnosis not present

## 2016-11-26 ENCOUNTER — Ambulatory Visit: Payer: Commercial Managed Care - HMO | Admitting: Family Medicine

## 2016-11-29 NOTE — Progress Notes (Deleted)
   Subjective:    Patient ID: [name], female    DOB: Oct 03, 1956, 61 y.o.   MRN: [id number]  HPI 61 y/o female presents for routine follow up of leg swelling.   Leg Swelling/Wounds on legs Last seen on 11/12/16. Seen by wound care and vascular surgery. S/P vein ablation bilaterally. Lasix increased to 40 mg daily at last visit.   Leg pain Currently prescribed Oxycodone 10 mg Q6 hours prn pain   Review of Systems     Objective:   Physical Exam There were no vitals taken for this visit.        Assessment & Plan:  No problem-specific Assessment & Plan notes found for this encounter.

## 2016-11-30 ENCOUNTER — Telehealth: Payer: Self-pay | Admitting: *Deleted

## 2016-11-30 ENCOUNTER — Other Ambulatory Visit: Payer: Self-pay | Admitting: *Deleted

## 2016-11-30 DIAGNOSIS — L97812 Non-pressure chronic ulcer of other part of right lower leg with fat layer exposed: Secondary | ICD-10-CM | POA: Diagnosis not present

## 2016-11-30 DIAGNOSIS — I87333 Chronic venous hypertension (idiopathic) with ulcer and inflammation of bilateral lower extremity: Secondary | ICD-10-CM | POA: Diagnosis not present

## 2016-11-30 DIAGNOSIS — S81801A Unspecified open wound, right lower leg, initial encounter: Secondary | ICD-10-CM | POA: Diagnosis not present

## 2016-11-30 DIAGNOSIS — I87311 Chronic venous hypertension (idiopathic) with ulcer of right lower extremity: Secondary | ICD-10-CM | POA: Diagnosis not present

## 2016-11-30 DIAGNOSIS — L97212 Non-pressure chronic ulcer of right calf with fat layer exposed: Secondary | ICD-10-CM | POA: Diagnosis not present

## 2016-11-30 DIAGNOSIS — L97822 Non-pressure chronic ulcer of other part of left lower leg with fat layer exposed: Secondary | ICD-10-CM | POA: Diagnosis not present

## 2016-11-30 MED ORDER — ESOMEPRAZOLE MAGNESIUM 40 MG PO CPDR: 40.0000 mg | DELAYED_RELEASE_CAPSULE | Freq: Every day | ORAL | 3 refills | Status: DC

## 2016-11-30 NOTE — Telephone Encounter (Signed)
Spoke to patient to advise Nicoletta Ba PA said we can refill this for 3 refills . Advised the patient she will have to call our office at some point to reestablish with another MD here. Dr. Deatra Ina is no longer at this office. She verbalized understanding this advice.

## 2016-12-02 ENCOUNTER — Telehealth: Payer: Self-pay | Admitting: Family Medicine

## 2016-12-02 ENCOUNTER — Ambulatory Visit: Payer: Medicare HMO | Admitting: Family Medicine

## 2016-12-02 DIAGNOSIS — D509 Iron deficiency anemia, unspecified: Secondary | ICD-10-CM

## 2016-12-02 DIAGNOSIS — Z8639 Personal history of other endocrine, nutritional and metabolic disease: Secondary | ICD-10-CM

## 2016-12-02 NOTE — Telephone Encounter (Signed)
Pt missed her appt again today but rescheduled it for next thur. Sh would like for her blood work orders to be put in so she can get them done before her next appt. Please advise

## 2016-12-03 NOTE — Telephone Encounter (Signed)
Pt insurance is ok to get labs. Deseree Kennon Holter, CMA

## 2016-12-03 NOTE — Telephone Encounter (Signed)
RN staff - please notify patient that we have not yet resolved the lab issue, this should be resolved by her appointment.

## 2016-12-06 DIAGNOSIS — S81801A Unspecified open wound, right lower leg, initial encounter: Secondary | ICD-10-CM | POA: Diagnosis not present

## 2016-12-06 NOTE — Telephone Encounter (Signed)
Labs ordered (CMP, Lipid, A1C, cbc). RN staff please call patient and set up lab appointment. Needs to be fasting.

## 2016-12-06 NOTE — Telephone Encounter (Signed)
Scheduled lab appointment for 12/08/16 at 8:30 am. Nat Christen, CMA

## 2016-12-07 ENCOUNTER — Encounter (HOSPITAL_BASED_OUTPATIENT_CLINIC_OR_DEPARTMENT_OTHER): Payer: Medicare HMO | Attending: Surgery

## 2016-12-07 DIAGNOSIS — I87333 Chronic venous hypertension (idiopathic) with ulcer and inflammation of bilateral lower extremity: Secondary | ICD-10-CM | POA: Insufficient documentation

## 2016-12-07 DIAGNOSIS — S81801A Unspecified open wound, right lower leg, initial encounter: Secondary | ICD-10-CM | POA: Diagnosis not present

## 2016-12-07 DIAGNOSIS — I10 Essential (primary) hypertension: Secondary | ICD-10-CM | POA: Insufficient documentation

## 2016-12-07 DIAGNOSIS — I87313 Chronic venous hypertension (idiopathic) with ulcer of bilateral lower extremity: Secondary | ICD-10-CM | POA: Diagnosis not present

## 2016-12-07 DIAGNOSIS — L97822 Non-pressure chronic ulcer of other part of left lower leg with fat layer exposed: Secondary | ICD-10-CM | POA: Insufficient documentation

## 2016-12-07 DIAGNOSIS — L97812 Non-pressure chronic ulcer of other part of right lower leg with fat layer exposed: Secondary | ICD-10-CM | POA: Diagnosis not present

## 2016-12-07 DIAGNOSIS — L97222 Non-pressure chronic ulcer of left calf with fat layer exposed: Secondary | ICD-10-CM | POA: Diagnosis not present

## 2016-12-07 DIAGNOSIS — L97212 Non-pressure chronic ulcer of right calf with fat layer exposed: Secondary | ICD-10-CM | POA: Diagnosis not present

## 2016-12-07 NOTE — Progress Notes (Signed)
   Subjective:    Patient ID: [name], female    DOB: [birth year], 61 y.o.   MRN: [id number]  HPI 61 y/o female presents for follow up of leg swelling and leg wounds.  Leg edema/Venous Insufficiency S/p vein ablation of bilateral legs, lasix increased to 40 mg at last visit, swelling is improved, wearing compression stockings daily.  Leg Pain: taking oxy 10 mg mostly at night. Tylenol in the day. Pain is improved with this regimen.Has ssociated muscle spasm of legs, mostly at night, requests muscle relaxant  Bilateral leg wounds Follows with wound care and vascular surgery; plans to get skin graft of left in a few weeks, already had skin graft of the right.   Anemia Not taking iron, no blood in stool, no abdominal pain  HM Due for eye exam (eye [facility]) and Zostavax (provided prescription)  Social  Never a smoker  Review of Systems  Constitutional: Negative for chills, fatigue and fever.  Respiratory: Negative for cough and shortness of breath.        Objective:   Physical Exam BP 140/76   Pulse 72   Temp 98.4 F (36.9 C) (Oral)   Ht 5\' 4"  (1.626 m)   Wt 175 lb 9.6 oz (79.7 kg)   SpO2 98%   BMI 30.14 kg/m   Gen: pleasant female, NAD Cardiac: RRR, S1 and S2 present, no murmur Resp: CTAB, normal effort Ext: trace edema Skin: bandages over bilateral LE wounds (did not uncover), see pictures from previous visit  Labs 12/08/16 HBG 8.7 Reviewed CMP and Lipid Profile     Assessment & Plan:  Iron deficiency anemia Hemoglobin down to 8.7. Has not been taking iron. No active bleeding.  -encouraged patient to restart iron supplementation.   Venous (peripheral) insufficiency Improved from last visit. -continue Lasix 40 mg daily and compression stockings  Leg cramping Bilateral leg cramping likely due to fluid/electrolyte shifts from edema. -prescribed flexeril prn  Healthcare maintenance Provided prescription for shingles vaccine. Encouraged yearly eye exam.    Multiple open wounds of lower leg Refill of Oxycodone provided while patient undergoing skin grafts. Plan to wean as tolerated.

## 2016-12-08 ENCOUNTER — Ambulatory Visit: Payer: Commercial Managed Care - HMO | Admitting: Podiatry

## 2016-12-08 ENCOUNTER — Other Ambulatory Visit: Payer: Medicare HMO

## 2016-12-08 DIAGNOSIS — D509 Iron deficiency anemia, unspecified: Secondary | ICD-10-CM

## 2016-12-08 DIAGNOSIS — Z8639 Personal history of other endocrine, nutritional and metabolic disease: Secondary | ICD-10-CM | POA: Diagnosis not present

## 2016-12-08 DIAGNOSIS — I10 Essential (primary) hypertension: Secondary | ICD-10-CM | POA: Diagnosis not present

## 2016-12-08 LAB — LIPID PANEL
Cholesterol: 122 mg/dL (ref <200)
HDL: 80 mg/dL (ref >50)
LDL Cholesterol: 33 mg/dL (ref <100)
Total CHOL/HDL Ratio: 1.5 Ratio (ref <5.0)
Triglycerides: 45 mg/dL (ref <150)
VLDL: 9 mg/dL (ref <30)

## 2016-12-08 LAB — CBC
HCT: 28.6 % — ABNORMAL LOW (ref 35.0–45.0)
Hemoglobin: 8.7 g/dL — ABNORMAL LOW (ref 11.7–15.5)
MCH: 25.8 pg — ABNORMAL LOW (ref 27.0–33.0)
MCHC: 30.4 g/dL — ABNORMAL LOW (ref 32.0–36.0)
MCV: 84.9 fL (ref 80.0–100.0)
MPV: 9 fL (ref 7.5–12.5)
Platelets: 334 10*3/uL (ref 140–400)
RBC: 3.37 MIL/uL — ABNORMAL LOW (ref 3.80–5.10)
RDW: 15.5 % — ABNORMAL HIGH (ref 11.0–15.0)
WBC: 6.3 10*3/uL (ref 3.8–10.8)

## 2016-12-08 LAB — COMPLETE METABOLIC PANEL WITH GFR
ALT: 17 U/L (ref 6–29)
AST: 28 U/L (ref 10–35)
Albumin: 3.5 g/dL — ABNORMAL LOW (ref 3.6–5.1)
Alkaline Phosphatase: 59 U/L (ref 33–130)
BUN: 17 mg/dL (ref 7–25)
CO2: 28 mmol/L (ref 20–31)
Calcium: 9.3 mg/dL (ref 8.6–10.4)
Chloride: 109 mmol/L (ref 98–110)
Creat: 0.85 mg/dL (ref 0.50–0.99)
GFR, Est African American: 86 mL/min (ref >=60)
GFR, Est Non African American: 75 mL/min (ref >=60)
Glucose, Bld: 80 mg/dL (ref 65–99)
Potassium: 3.7 mmol/L (ref 3.5–5.3)
Sodium: 144 mmol/L (ref 135–146)
Total Bilirubin: 0.3 mg/dL (ref 0.2–1.2)
Total Protein: 6.7 g/dL (ref 6.1–8.1)

## 2016-12-09 ENCOUNTER — Other Ambulatory Visit: Payer: Self-pay | Admitting: Family Medicine

## 2016-12-09 ENCOUNTER — Ambulatory Visit (INDEPENDENT_AMBULATORY_CARE_PROVIDER_SITE_OTHER): Payer: Medicare HMO | Admitting: Family Medicine

## 2016-12-09 DIAGNOSIS — K219 Gastro-esophageal reflux disease without esophagitis: Secondary | ICD-10-CM

## 2016-12-09 DIAGNOSIS — D509 Iron deficiency anemia, unspecified: Secondary | ICD-10-CM

## 2016-12-09 DIAGNOSIS — R252 Cramp and spasm: Secondary | ICD-10-CM

## 2016-12-09 DIAGNOSIS — Z0000 Encounter for general adult medical examination without abnormal findings: Secondary | ICD-10-CM

## 2016-12-09 DIAGNOSIS — I872 Venous insufficiency (chronic) (peripheral): Secondary | ICD-10-CM

## 2016-12-09 DIAGNOSIS — S81809D Unspecified open wound, unspecified lower leg, subsequent encounter: Secondary | ICD-10-CM

## 2016-12-09 MED ORDER — CYCLOBENZAPRINE HCL 5 MG PO TABS: 5.0000 mg | ORAL_TABLET | Freq: Three times a day (TID) | ORAL | 1 refills | Status: DC | PRN

## 2016-12-09 MED ORDER — OXYCODONE HCL 10 MG PO TABS: 10.0000 mg | ORAL_TABLET | Freq: Four times a day (QID) | ORAL | 0 refills | Status: DC | PRN

## 2016-12-09 NOTE — Telephone Encounter (Signed)
Needs refill on sacralfate  [employer]

## 2016-12-09 NOTE — Patient Instructions (Signed)
It was nice to see you today.  Anemia - restart the iron and recheck the blood levels in 1-2 months.  Leg pain - continue Oxcodone 1-2 tablets per day as needed, take Flexeril for muscle spasm  Continue lasix 40 mg daily

## 2016-12-10 ENCOUNTER — Encounter: Payer: Self-pay | Admitting: Family Medicine

## 2016-12-10 IMAGING — RF DG ESOPHAGUS
14 of 22 series · 14 of 22 positions shown · non-contrast
Comparison: CT 09/24/2012

CLINICAL DATA: Patient status post Roux-en-Y gastric bypass
bariatric surgery. Continue dysphagia and feeling of food retention
in the esophagus.

EXAM:
ESOPHOGRAM / BARIUM SWALLOW / BARIUM TABLET STUDY
TECHNIQUE: Combined double contrast and single contrast examination performed
using effervescent crystals, thick barium liquid, and thin barium
liquid. The patient was observed with fluoroscopy swallowing a 13 mm
barium sulphate tablet.
FLUOROSCOPY TIME:  Radiation Exposure Index (as provided by the
fluoroscopic device):
If the device does not provide the exposure index:
Fluoroscopy Time:  1 minutes 51 seconds
Number of Acquired Images:  22

[Series 1: run · 1 of 1 slices shown (1 of 14)]
[im 1/1]
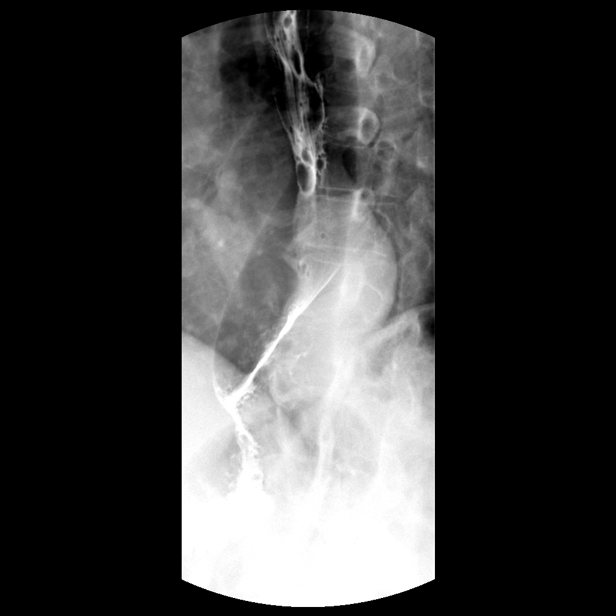

[Series 3: run · 1 of 1 slices shown (2 of 14)]
[im 1/1]
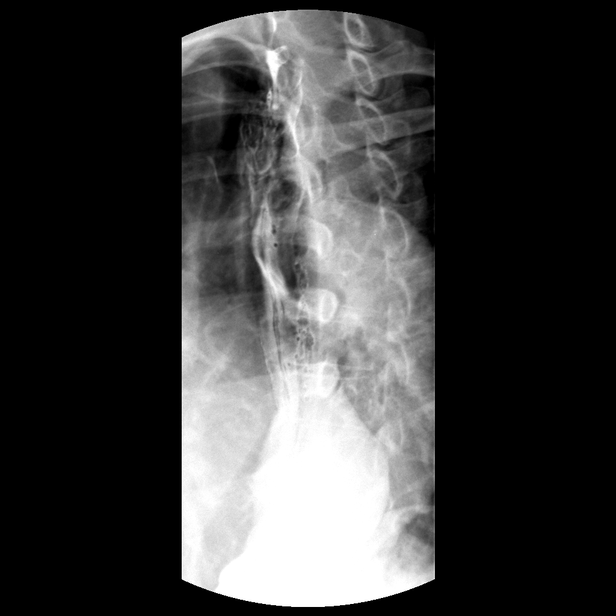

[Series 4: run · 1 of 1 slices shown (3 of 14)]
[im 1/1]
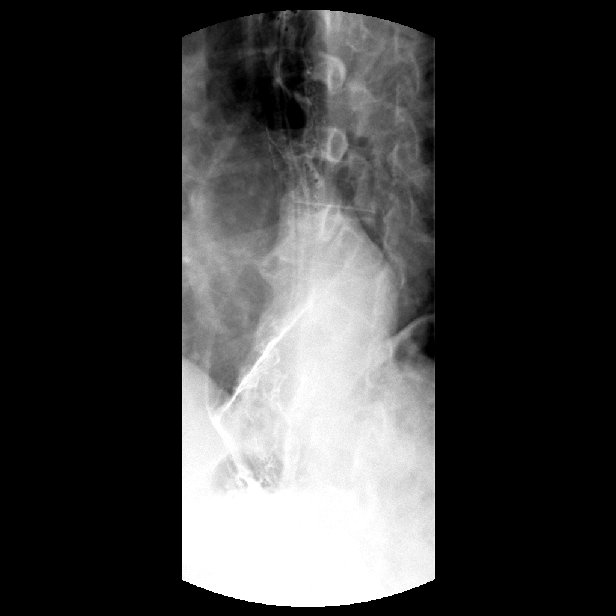

[Series 6: run · 1 of 1 slices shown (4 of 14)]
[im 1/1]
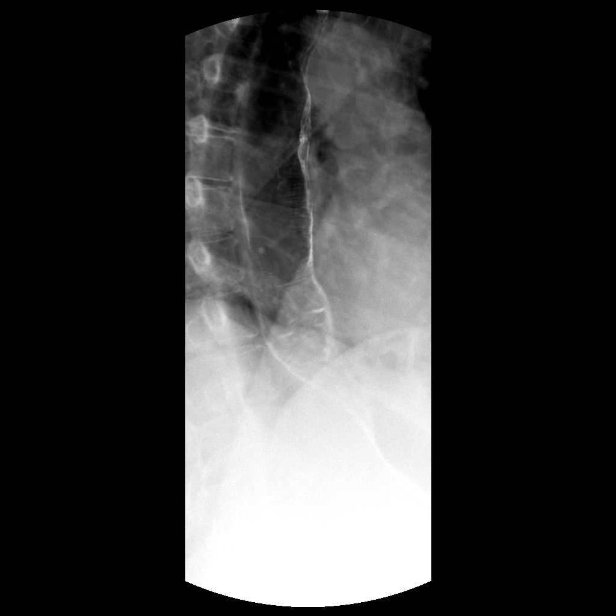

[Series 8: run · 1 of 1 slices shown (5 of 14)]
[im 1/1]
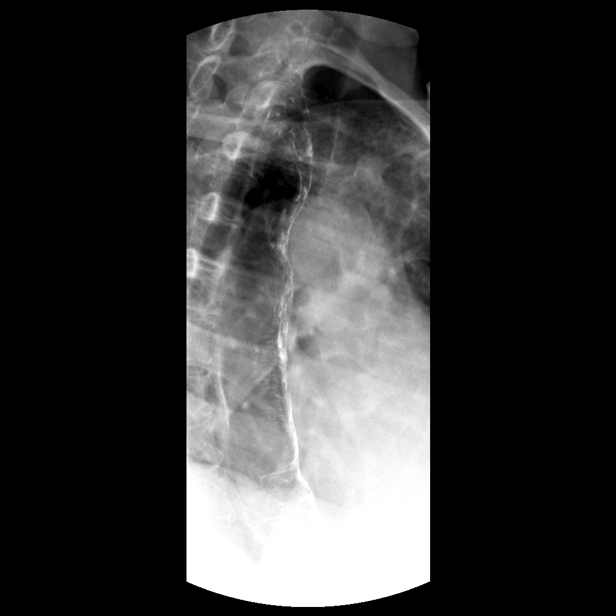

[Series 9: run · 1 of 1 slices shown (6 of 14)]
[im 1/1]
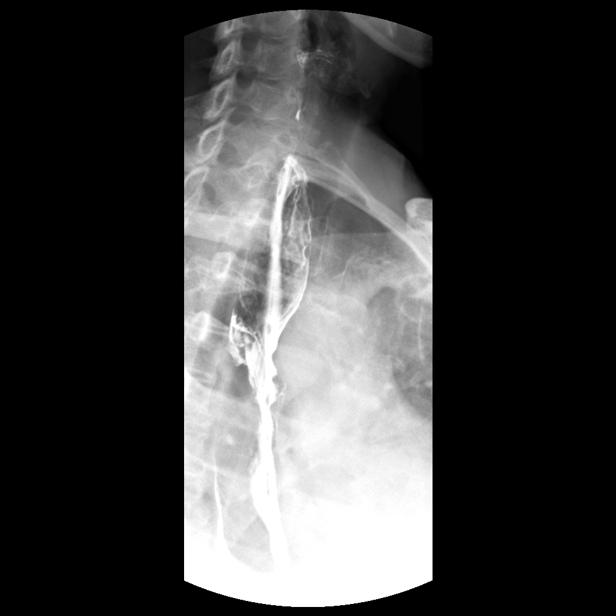

[Series 11: run · 1 of 1 slices shown (7 of 14)]
[im 1/1]
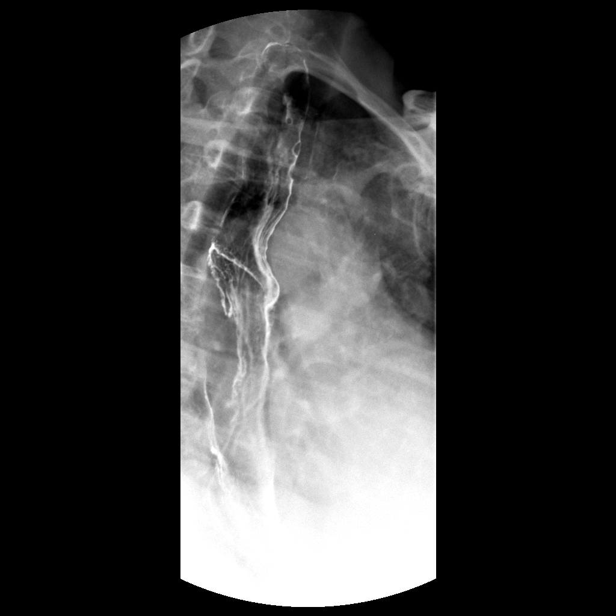

[Series 12: run · 1 of 1 slices shown (8 of 14)]
[im 1/1]
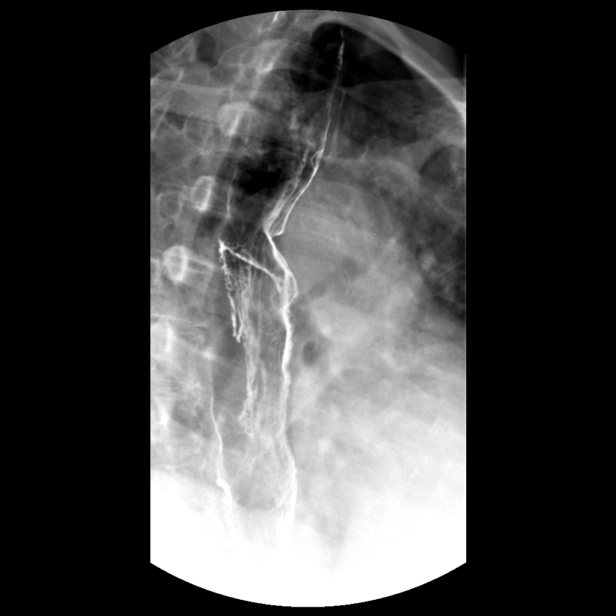

[Series 14: run · 1 of 1 slices shown (9 of 14)]
[im 1/1]
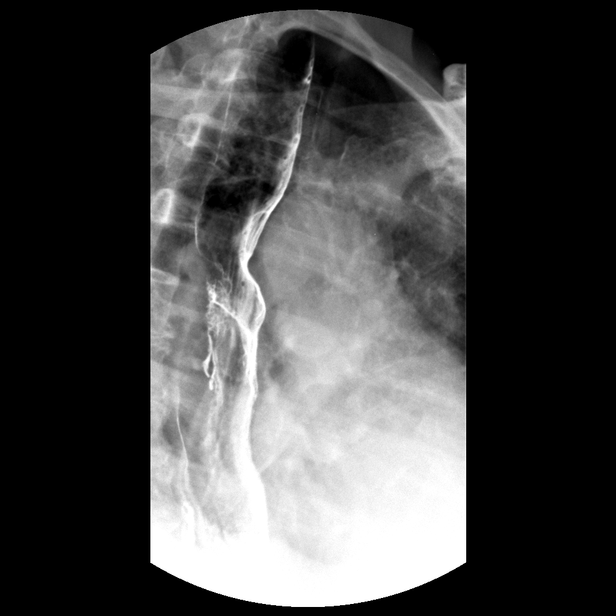

[Series 15: run · 1 of 1 slices shown (10 of 14)]
[im 1/1]
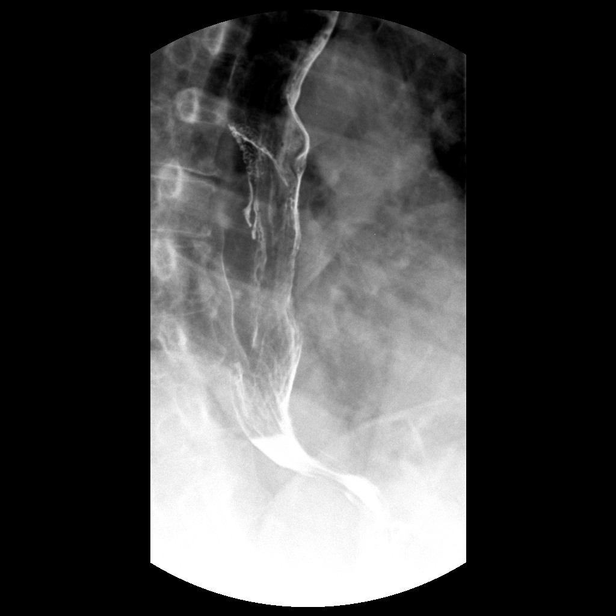

[Series 17: run · 1 of 1 slices shown (11 of 14)]
[im 1/1]
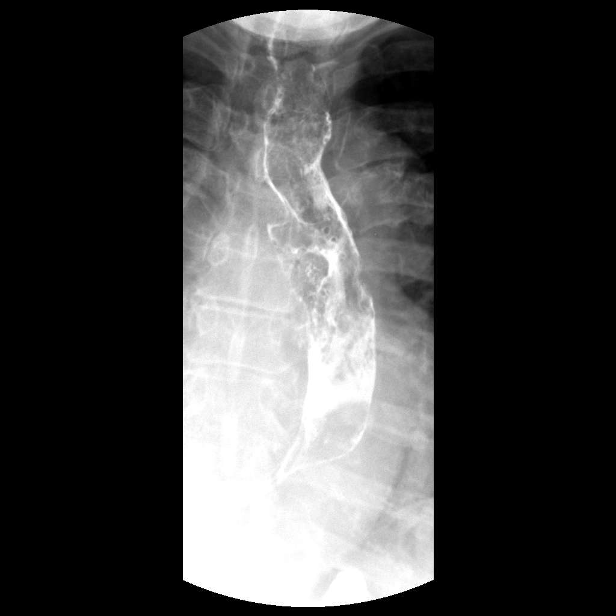

[Series 19: run · 1 of 1 slices shown (12 of 14)]
[im 1/1]
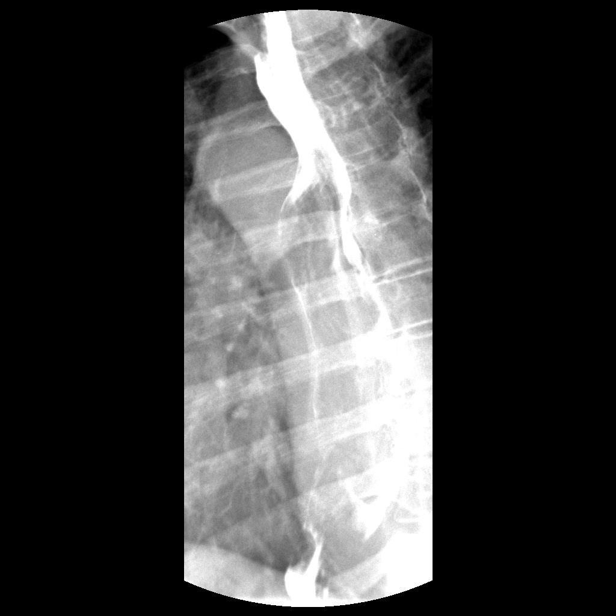

[Series 20: run · 1 of 1 slices shown (13 of 14)]
[im 1/1]
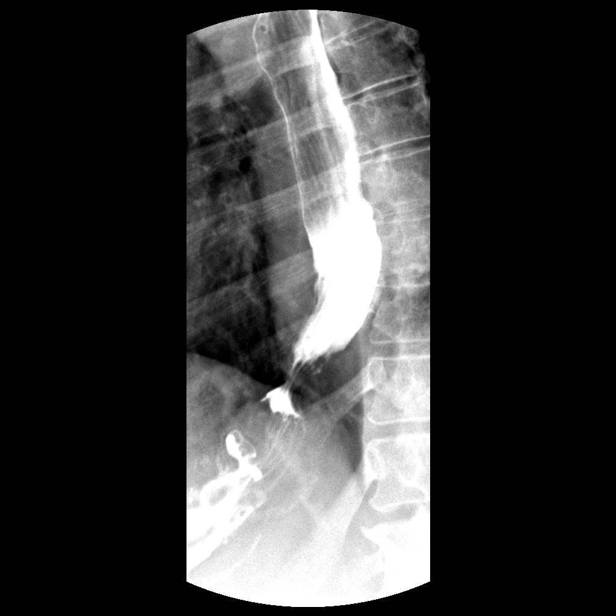

[Series 22: run · 1 of 1 slices shown (14 of 14)]
[im 1/1]
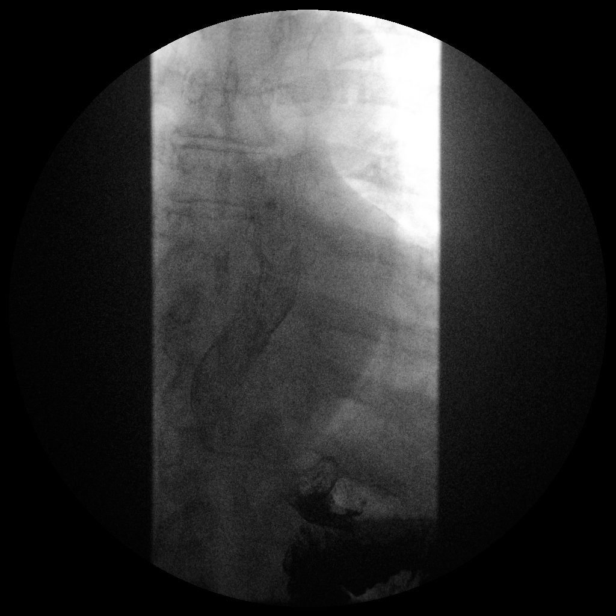

[14 of 22 positions shown; findings below may reference images not displayed]

FINDINGS: The esophagus is patulous leading up to the GE junction. No
high-grade stricture obstruction at the GE junction. Some retained
secretions within the thoracic esophagus which clears ultimately on
subsequent imaging.

With patient in prone position, there is escape of the barium bolus
from the primary stripping wave and to ad fro motion within the
patulous esophagus. 13 mm barium tablet passed the GE junction
easily.

Mild gastroesophageal reflux noted.
IMPRESSION: 1. No evidence of high-grade stricture or mass within the esophagus.
2. Patulous esophagus with esophageal dysmotility is like a
combination of bypass anatomy and presbyesophagus.
3. No mucosal lesion identified. Some retained secretions are noted
within the esophagus.
4. Mild gastroesophageal reflux noted.

## 2016-12-10 NOTE — Assessment & Plan Note (Signed)
Hemoglobin down to 8.7. Has not been taking iron. No active bleeding.  -encouraged patient to restart iron supplementation.

## 2016-12-10 NOTE — Assessment & Plan Note (Signed)
Refill of Oxycodone provided while patient undergoing skin grafts. Plan to wean as tolerated.

## 2016-12-10 NOTE — Assessment & Plan Note (Signed)
Bilateral leg cramping likely due to fluid/electrolyte shifts from edema. -prescribed flexeril prn

## 2016-12-10 NOTE — Assessment & Plan Note (Signed)
Provided prescription for shingles vaccine. Encouraged yearly eye exam.

## 2016-12-10 NOTE — Assessment & Plan Note (Signed)
Improved from last visit. -continue Lasix 40 mg daily and compression stockings

## 2016-12-14 DIAGNOSIS — L97812 Non-pressure chronic ulcer of other part of right lower leg with fat layer exposed: Secondary | ICD-10-CM | POA: Diagnosis not present

## 2016-12-14 DIAGNOSIS — I87333 Chronic venous hypertension (idiopathic) with ulcer and inflammation of bilateral lower extremity: Secondary | ICD-10-CM | POA: Diagnosis not present

## 2016-12-14 DIAGNOSIS — I10 Essential (primary) hypertension: Secondary | ICD-10-CM | POA: Diagnosis not present

## 2016-12-14 DIAGNOSIS — I87313 Chronic venous hypertension (idiopathic) with ulcer of bilateral lower extremity: Secondary | ICD-10-CM | POA: Diagnosis not present

## 2016-12-14 DIAGNOSIS — L97822 Non-pressure chronic ulcer of other part of left lower leg with fat layer exposed: Secondary | ICD-10-CM | POA: Diagnosis not present

## 2016-12-14 DIAGNOSIS — L97222 Non-pressure chronic ulcer of left calf with fat layer exposed: Secondary | ICD-10-CM | POA: Diagnosis not present

## 2016-12-14 DIAGNOSIS — L97212 Non-pressure chronic ulcer of right calf with fat layer exposed: Secondary | ICD-10-CM | POA: Diagnosis not present

## 2016-12-15 ENCOUNTER — Telehealth: Payer: Self-pay | Admitting: *Deleted

## 2016-12-15 NOTE — Telephone Encounter (Signed)
[employer] notified us by fax that the medication Nexium DR 40 mg capsules, the transition policy ensured that this patient received a temporary supply of this medication. Received this fax on 12-14-2016.  Sent to be scanned.

## 2016-12-16 NOTE — Telephone Encounter (Signed)
See note from 12-15-2016.

## 2016-12-21 DIAGNOSIS — L97212 Non-pressure chronic ulcer of right calf with fat layer exposed: Secondary | ICD-10-CM | POA: Diagnosis not present

## 2016-12-21 DIAGNOSIS — L97222 Non-pressure chronic ulcer of left calf with fat layer exposed: Secondary | ICD-10-CM | POA: Diagnosis not present

## 2016-12-21 DIAGNOSIS — I87333 Chronic venous hypertension (idiopathic) with ulcer and inflammation of bilateral lower extremity: Secondary | ICD-10-CM | POA: Diagnosis not present

## 2016-12-21 DIAGNOSIS — L97812 Non-pressure chronic ulcer of other part of right lower leg with fat layer exposed: Secondary | ICD-10-CM | POA: Diagnosis not present

## 2016-12-21 DIAGNOSIS — S81801A Unspecified open wound, right lower leg, initial encounter: Secondary | ICD-10-CM | POA: Diagnosis not present

## 2016-12-21 DIAGNOSIS — I10 Essential (primary) hypertension: Secondary | ICD-10-CM | POA: Diagnosis not present

## 2016-12-21 DIAGNOSIS — L97822 Non-pressure chronic ulcer of other part of left lower leg with fat layer exposed: Secondary | ICD-10-CM | POA: Diagnosis not present

## 2016-12-27 ENCOUNTER — Telehealth: Payer: Self-pay | Admitting: Family Medicine

## 2016-12-27 ENCOUNTER — Other Ambulatory Visit: Payer: Self-pay | Admitting: Family Medicine

## 2016-12-27 NOTE — Telephone Encounter (Signed)
Pt states she tried to refill muscle relaxer ( pt did not know the name)  And was told it was too soon. Pt would like Dr. Ree Kida to call her to discuss this because pt states it is not too early because pt takes it 3 times a day. ep

## 2016-12-28 ENCOUNTER — Other Ambulatory Visit: Payer: Self-pay | Admitting: Family Medicine

## 2016-12-28 DIAGNOSIS — L97822 Non-pressure chronic ulcer of other part of left lower leg with fat layer exposed: Secondary | ICD-10-CM | POA: Diagnosis not present

## 2016-12-28 DIAGNOSIS — I87312 Chronic venous hypertension (idiopathic) with ulcer of left lower extremity: Secondary | ICD-10-CM | POA: Diagnosis not present

## 2016-12-28 DIAGNOSIS — L97212 Non-pressure chronic ulcer of right calf with fat layer exposed: Secondary | ICD-10-CM | POA: Diagnosis not present

## 2016-12-28 DIAGNOSIS — L97222 Non-pressure chronic ulcer of left calf with fat layer exposed: Secondary | ICD-10-CM | POA: Diagnosis not present

## 2016-12-28 DIAGNOSIS — I87333 Chronic venous hypertension (idiopathic) with ulcer and inflammation of bilateral lower extremity: Secondary | ICD-10-CM | POA: Diagnosis not present

## 2016-12-28 DIAGNOSIS — I10 Essential (primary) hypertension: Secondary | ICD-10-CM | POA: Diagnosis not present

## 2016-12-28 DIAGNOSIS — L97812 Non-pressure chronic ulcer of other part of right lower leg with fat layer exposed: Secondary | ICD-10-CM | POA: Diagnosis not present

## 2016-12-28 MED ORDER — CYCLOBENZAPRINE HCL 5 MG PO TABS: 5.0000 mg | ORAL_TABLET | Freq: Three times a day (TID) | ORAL | 0 refills | Status: DC | PRN

## 2016-12-28 NOTE — Telephone Encounter (Signed)
Sent in refill today  

## 2016-12-28 NOTE — Progress Notes (Signed)
Sent in refill (1 month supply).

## 2016-12-28 NOTE — Telephone Encounter (Signed)
Pt is calling back to see if Dr. Ree Kida had called her pharmacy about her muscle relaxer and the dosage being wrong. jw

## 2017-01-04 ENCOUNTER — Other Ambulatory Visit: Payer: Self-pay | Admitting: Family Medicine

## 2017-01-04 ENCOUNTER — Encounter (HOSPITAL_BASED_OUTPATIENT_CLINIC_OR_DEPARTMENT_OTHER): Payer: Medicare HMO | Attending: Surgery

## 2017-01-04 DIAGNOSIS — I87333 Chronic venous hypertension (idiopathic) with ulcer and inflammation of bilateral lower extremity: Secondary | ICD-10-CM | POA: Diagnosis not present

## 2017-01-04 DIAGNOSIS — D649 Anemia, unspecified: Secondary | ICD-10-CM | POA: Diagnosis not present

## 2017-01-04 DIAGNOSIS — I890 Lymphedema, not elsewhere classified: Secondary | ICD-10-CM | POA: Diagnosis not present

## 2017-01-04 DIAGNOSIS — L97222 Non-pressure chronic ulcer of left calf with fat layer exposed: Secondary | ICD-10-CM | POA: Diagnosis not present

## 2017-01-04 DIAGNOSIS — I110 Hypertensive heart disease with heart failure: Secondary | ICD-10-CM | POA: Insufficient documentation

## 2017-01-04 DIAGNOSIS — I87312 Chronic venous hypertension (idiopathic) with ulcer of left lower extremity: Secondary | ICD-10-CM | POA: Diagnosis not present

## 2017-01-04 DIAGNOSIS — I509 Heart failure, unspecified: Secondary | ICD-10-CM | POA: Insufficient documentation

## 2017-01-04 DIAGNOSIS — L97212 Non-pressure chronic ulcer of right calf with fat layer exposed: Secondary | ICD-10-CM | POA: Diagnosis not present

## 2017-01-05 ENCOUNTER — Ambulatory Visit (INDEPENDENT_AMBULATORY_CARE_PROVIDER_SITE_OTHER): Payer: Medicare HMO | Admitting: Podiatry

## 2017-01-05 ENCOUNTER — Encounter: Payer: Self-pay | Admitting: Podiatry

## 2017-01-05 VITALS — BP 134/84 | HR 93 | Resp 18

## 2017-01-05 DIAGNOSIS — S81801A Unspecified open wound, right lower leg, initial encounter: Secondary | ICD-10-CM | POA: Diagnosis not present

## 2017-01-05 DIAGNOSIS — M79674 Pain in right toe(s): Secondary | ICD-10-CM

## 2017-01-05 DIAGNOSIS — M79675 Pain in left toe(s): Secondary | ICD-10-CM | POA: Diagnosis not present

## 2017-01-05 DIAGNOSIS — B351 Tinea unguium: Secondary | ICD-10-CM

## 2017-01-05 DIAGNOSIS — E119 Type 2 diabetes mellitus without complications: Secondary | ICD-10-CM

## 2017-01-05 NOTE — Patient Instructions (Signed)

## 2017-01-05 NOTE — Progress Notes (Signed)
Patient ID: [name], female   DOB: 1956-10-09, 61 y.o.   MRN: [id number]    Subjective: This patient presents today complaining of uncomfortable toenails walking wearing shoes and requests toenail debridement. Patient states that she is currently under care for wound on her right lower extremity for the past 3 months on an ongoing and continuing basis  Objective: Orientated 3 DP and PT pulses 2/4 bilaterally Reflex immediate bilaterally Bilateral mild peripheral edema bilaterally Lower t legs have gauze dressing with drainage noted through bandage without malodor Sensation to 10 g monofilament wire intact 5/5 bilaterally Vibratory sensation reactive bilaterally Ankle reflex equal and reactive bilaterally Toenails are hypertrophic, elongated, deformed and tender to direct palpation 6-10 HAV bilaterally Manual motor testing dorsi flexion, plantar flexion, inversion, eversion 5/5 bilaterally  Assessment: Diabetic with satisfactory neurovascular status Lower extremity bilateral wound under management wound care center Symptomatic onychomycoses 6-10  Plan: Debridement of toenails 6-10 mechanically an electrician without any bleeding  Reappoint 3 months

## 2017-01-06 ENCOUNTER — Ambulatory Visit: Payer: Medicare HMO | Admitting: Family Medicine

## 2017-01-06 ENCOUNTER — Other Ambulatory Visit: Payer: Self-pay | Admitting: Family Medicine

## 2017-01-06 NOTE — Telephone Encounter (Signed)
2nd request. ep °

## 2017-01-06 NOTE — Telephone Encounter (Signed)
She is out of her pain pill-oxycodone.  She would like to comp pick up the prescription tomorrow morning.  Please call her to let her know when the prescription is ready,

## 2017-01-07 MED ORDER — OXYCODONE HCL 10 MG PO TABS: 10.0000 mg | ORAL_TABLET | Freq: Four times a day (QID) | ORAL | 0 refills | Status: DC | PRN

## 2017-01-07 NOTE — Telephone Encounter (Signed)
Pt is aware. ep

## 2017-01-10 ENCOUNTER — Telehealth: Payer: Self-pay

## 2017-01-10 NOTE — Telephone Encounter (Signed)
Patients Nexium is a non-formulary drug.  Covered drugs are the Dexilant 30 and 60mg , omeprazole 10, 20, 40mg  , and pantoprazole 20, 40mg  sizes. Please advise if we can change to one of these.  Thank you.

## 2017-01-11 DIAGNOSIS — S81801A Unspecified open wound, right lower leg, initial encounter: Secondary | ICD-10-CM | POA: Diagnosis not present

## 2017-01-11 DIAGNOSIS — L97222 Non-pressure chronic ulcer of left calf with fat layer exposed: Secondary | ICD-10-CM | POA: Diagnosis not present

## 2017-01-11 DIAGNOSIS — L97212 Non-pressure chronic ulcer of right calf with fat layer exposed: Secondary | ICD-10-CM | POA: Diagnosis not present

## 2017-01-11 DIAGNOSIS — I87333 Chronic venous hypertension (idiopathic) with ulcer and inflammation of bilateral lower extremity: Secondary | ICD-10-CM | POA: Diagnosis not present

## 2017-01-11 DIAGNOSIS — I890 Lymphedema, not elsewhere classified: Secondary | ICD-10-CM | POA: Diagnosis not present

## 2017-01-11 DIAGNOSIS — I110 Hypertensive heart disease with heart failure: Secondary | ICD-10-CM | POA: Diagnosis not present

## 2017-01-11 DIAGNOSIS — I509 Heart failure, unspecified: Secondary | ICD-10-CM | POA: Diagnosis not present

## 2017-01-11 DIAGNOSIS — I87313 Chronic venous hypertension (idiopathic) with ulcer of bilateral lower extremity: Secondary | ICD-10-CM | POA: Diagnosis not present

## 2017-01-11 DIAGNOSIS — D649 Anemia, unspecified: Secondary | ICD-10-CM | POA: Diagnosis not present

## 2017-01-11 MED ORDER — PANTOPRAZOLE SODIUM 40 MG PO TBEC: 40.0000 mg | DELAYED_RELEASE_TABLET | Freq: Every day | ORAL | 1 refills | Status: DC

## 2017-01-11 NOTE — Telephone Encounter (Signed)
If she feels she needs a chronic med for  GERD , can switch to protonix 40 mg po daily , unless she wants to try Pepcid BID 20 mg and get off PPI... Can give refills x 6 then needs offoce visit

## 2017-01-11 NOTE — Telephone Encounter (Signed)
Spoke with [name] and she wants to try the Protonix and she will call when she is close to running out for an appointment with Amy Esterwood PA-C.  I sent the rx to [facility].

## 2017-01-12 ENCOUNTER — Encounter: Payer: Self-pay | Admitting: Family Medicine

## 2017-01-12 NOTE — Progress Notes (Signed)
   Subjective:    Patient ID: [name], female    DOB: Feb 17, 1956, 61 y.o.   MRN: [id number]  HPI 61 y/o female presents for follow up of leg pain/swelling.  Leg pain/swelling/venous stasis Taking lasix 40 mg daily, wearing compression stockings daily. Pain improved with prn Oxycodone (taking mostly at night), takes Tylenol during the day, does have cramping in legs/hands intermittently  HM Due for eye exam and Hemoglobin A1C  Anemia Started retaking iron. No blood in stool. No bleeding.   Hx. DM Last A1C 05/2016 was 5.6, no current medications.   Has appointment later today with bariatric surgery (history of gastric bypass). Dr. Deniece Portela   Review of Systems  Constitutional: Negative for chills, fatigue and fever.  Respiratory: Negative for shortness of breath.   Cardiovascular: Negative for chest pain.  Gastrointestinal: Negative for diarrhea, nausea and vomiting.       Objective:   Physical Exam BP 130/76   Pulse 91   Temp 97.9 F (36.6 C) (Oral)   Ht 5\' 4"  (1.626 m)   Wt 177 lb (80.3 kg)   SpO2 99%   BMI 30.38 kg/m  Gen: pleasant female, NAD Cardiac: RRR, S1 and S2 present, no murmur Resp: CTAB, normal effort Ext: trace edema, bilateral wounds covered with dressing (not examined today)  POC HBG 8.8 POC A1C 5.6     Assessment & Plan:  Venous (peripheral) insufficiency Stable with Lasix 40 mg daily and compression stockings. Anemia likely contributing. -continue current therapy  Iron deficiency anemia Continued anemia (Hbg 8.8) despite iron supplementation. Suspect may not be absorbing iron due to history of gastric bypass.  -recheck iron studies today (B12, folate, CBC, ferritin, iron, TIBC) -if unremarkable consider check FOBT and/or LDH/haptoglobin to rule out hemolysis.   H/O type 2 diabetes mellitus Controlled. A1C 5.6 -continue diet control -encouraged yearly eye exam  Leg cramping Improved with Flexeril. -continue current therapy

## 2017-01-13 ENCOUNTER — Encounter: Payer: Self-pay | Admitting: Family Medicine

## 2017-01-13 ENCOUNTER — Ambulatory Visit (INDEPENDENT_AMBULATORY_CARE_PROVIDER_SITE_OTHER): Payer: Medicare HMO | Admitting: Family Medicine

## 2017-01-13 VITALS — BP 130/76 | HR 91 | Temp 97.9°F | Ht 64.0 in | Wt 177.0 lb

## 2017-01-13 DIAGNOSIS — I872 Venous insufficiency (chronic) (peripheral): Secondary | ICD-10-CM

## 2017-01-13 DIAGNOSIS — Z8639 Personal history of other endocrine, nutritional and metabolic disease: Secondary | ICD-10-CM | POA: Diagnosis not present

## 2017-01-13 DIAGNOSIS — R252 Cramp and spasm: Secondary | ICD-10-CM

## 2017-01-13 DIAGNOSIS — D509 Iron deficiency anemia, unspecified: Secondary | ICD-10-CM

## 2017-01-13 DIAGNOSIS — D508 Other iron deficiency anemias: Secondary | ICD-10-CM | POA: Diagnosis not present

## 2017-01-13 DIAGNOSIS — Z87891 Personal history of nicotine dependence: Secondary | ICD-10-CM | POA: Diagnosis not present

## 2017-01-13 DIAGNOSIS — R1011 Right upper quadrant pain: Secondary | ICD-10-CM | POA: Diagnosis not present

## 2017-01-13 DIAGNOSIS — R1319 Other dysphagia: Secondary | ICD-10-CM | POA: Diagnosis not present

## 2017-01-13 DIAGNOSIS — Z9884 Bariatric surgery status: Secondary | ICD-10-CM | POA: Diagnosis not present

## 2017-01-13 LAB — CBC WITH DIFFERENTIAL/PLATELET
Basophils Absolute: 64 cells/uL (ref 0–200)
Basophils Relative: 1 %
Eosinophils Absolute: 256 cells/uL (ref 15–500)
Eosinophils Relative: 4 %
HCT: 29.9 % — ABNORMAL LOW (ref 35.0–45.0)
Hemoglobin: 9.3 g/dL — ABNORMAL LOW (ref 11.7–15.5)
Lymphocytes Relative: 31 %
Lymphs Abs: 1984 cells/uL (ref 850–3900)
MCH: 26.3 pg — ABNORMAL LOW (ref 27.0–33.0)
MCHC: 31.1 g/dL — ABNORMAL LOW (ref 32.0–36.0)
MCV: 84.7 fL (ref 80.0–100.0)
MPV: 8.7 fL (ref 7.5–12.5)
Monocytes Absolute: 576 cells/uL (ref 200–950)
Monocytes Relative: 9 %
Neutro Abs: 3520 cells/uL (ref 1500–7800)
Neutrophils Relative %: 55 %
Platelets: 296 10*3/uL (ref 140–400)
RBC: 3.53 MIL/uL — ABNORMAL LOW (ref 3.80–5.10)
RDW: 17.3 % — ABNORMAL HIGH (ref 11.0–15.0)
WBC: 6.4 10*3/uL (ref 3.8–10.8)

## 2017-01-13 LAB — FOLATE: Folate: >24 ng/mL (ref >5.4)

## 2017-01-13 LAB — VITAMIN B12: Vitamin B-12: 759 pg/mL (ref 200–1100)

## 2017-01-13 LAB — FERRITIN: Ferritin: 18 ng/mL — ABNORMAL LOW (ref 20–288)

## 2017-01-13 LAB — IRON AND TIBC
%SAT: 11 % (ref 11–50)
Iron: 53 ug/dL (ref 45–160)
TIBC: 477 ug/dL — ABNORMAL HIGH (ref 250–450)
UIBC: 424 ug/dL — ABNORMAL HIGH (ref 125–400)

## 2017-01-13 LAB — POCT HEMOGLOBIN: Hemoglobin: 8.8 g/dL — AB (ref 12.2–16.2)

## 2017-01-13 LAB — POCT GLYCOSYLATED HEMOGLOBIN (HGB A1C): Hemoglobin A1C: 5.6

## 2017-01-13 MED ORDER — CYCLOBENZAPRINE HCL 5 MG PO TABS: 5.0000 mg | ORAL_TABLET | Freq: Three times a day (TID) | ORAL | 0 refills | Status: DC | PRN

## 2017-01-13 MED ORDER — OXYCODONE HCL 10 MG PO TABS: 10.0000 mg | ORAL_TABLET | Freq: Four times a day (QID) | ORAL | 0 refills | Status: DC | PRN

## 2017-01-13 MED ORDER — ZOLPIDEM TARTRATE 10 MG PO TABS: ORAL_TABLET | ORAL | 2 refills | Status: DC

## 2017-01-13 NOTE — Assessment & Plan Note (Signed)
Improved with Flexeril. -continue current therapy

## 2017-01-13 NOTE — Assessment & Plan Note (Signed)
Continued anemia (Hbg 8.8) despite iron supplementation. Suspect may not be absorbing iron due to history of gastric bypass.  -recheck iron studies today (B12, folate, CBC, ferritin, iron, TIBC) -if unremarkable consider check FOBT and/or LDH/haptoglobin to rule out hemolysis.

## 2017-01-13 NOTE — Assessment & Plan Note (Signed)
Stable with Lasix 40 mg daily and compression stockings. Anemia likely contributing. -continue current therapy

## 2017-01-13 NOTE — Assessment & Plan Note (Signed)
Controlled. A1C 5.6 -continue diet control -encouraged yearly eye exam

## 2017-01-13 NOTE — Patient Instructions (Addendum)
It was nice to see you today.  I have refilled your pain medication and Sleeping Pill.  Anemia - continue iron pills, check labs (Iron, TIBC, B12, Folate, Ferritin).

## 2017-01-14 ENCOUNTER — Telehealth: Payer: Self-pay | Admitting: Family Medicine

## 2017-01-14 DIAGNOSIS — D509 Iron deficiency anemia, unspecified: Secondary | ICD-10-CM

## 2017-01-14 MED ORDER — FERROUS SULFATE 220 (44 FE) MG/5ML PO ELIX: 220.0000 mg | ORAL_SOLUTION | Freq: Three times a day (TID) | ORAL | 2 refills | Status: DC

## 2017-01-14 NOTE — Assessment & Plan Note (Signed)
Anemia labs most consistent with iron deficiency. Will increase iron to TID. She has seen by gastric surgery yesterday and has some upcoming labs (gastric surgeon to send office notes to me).

## 2017-01-14 NOTE — Telephone Encounter (Signed)
Anemia labs most consistent with iron deficiency. Will increase iron to TID. She has seen by gastric surgery yesterday and has some upcoming labs (gastric surgeon to send office notes to me).

## 2017-01-18 DIAGNOSIS — L97212 Non-pressure chronic ulcer of right calf with fat layer exposed: Secondary | ICD-10-CM | POA: Diagnosis not present

## 2017-01-18 DIAGNOSIS — I87312 Chronic venous hypertension (idiopathic) with ulcer of left lower extremity: Secondary | ICD-10-CM | POA: Diagnosis not present

## 2017-01-18 DIAGNOSIS — L97222 Non-pressure chronic ulcer of left calf with fat layer exposed: Secondary | ICD-10-CM | POA: Diagnosis not present

## 2017-01-18 DIAGNOSIS — I890 Lymphedema, not elsewhere classified: Secondary | ICD-10-CM | POA: Diagnosis not present

## 2017-01-18 DIAGNOSIS — I110 Hypertensive heart disease with heart failure: Secondary | ICD-10-CM | POA: Diagnosis not present

## 2017-01-18 DIAGNOSIS — D649 Anemia, unspecified: Secondary | ICD-10-CM | POA: Diagnosis not present

## 2017-01-18 DIAGNOSIS — I509 Heart failure, unspecified: Secondary | ICD-10-CM | POA: Diagnosis not present

## 2017-01-18 DIAGNOSIS — I87333 Chronic venous hypertension (idiopathic) with ulcer and inflammation of bilateral lower extremity: Secondary | ICD-10-CM | POA: Diagnosis not present

## 2017-01-22 ENCOUNTER — Other Ambulatory Visit: Payer: Self-pay | Admitting: Family Medicine

## 2017-01-24 NOTE — Telephone Encounter (Signed)
Received refill request of Flexeril and Ambien, just refilled both medications on last visit 3/15. Will not refill again today.

## 2017-01-25 DIAGNOSIS — Z886 Allergy status to analgesic agent status: Secondary | ICD-10-CM | POA: Diagnosis not present

## 2017-01-25 DIAGNOSIS — D509 Iron deficiency anemia, unspecified: Secondary | ICD-10-CM | POA: Diagnosis not present

## 2017-01-25 DIAGNOSIS — Z980 Intestinal bypass and anastomosis status: Secondary | ICD-10-CM | POA: Diagnosis not present

## 2017-01-25 DIAGNOSIS — E119 Type 2 diabetes mellitus without complications: Secondary | ICD-10-CM | POA: Diagnosis not present

## 2017-01-25 DIAGNOSIS — I509 Heart failure, unspecified: Secondary | ICD-10-CM | POA: Diagnosis not present

## 2017-01-25 DIAGNOSIS — D649 Anemia, unspecified: Secondary | ICD-10-CM | POA: Diagnosis not present

## 2017-01-25 DIAGNOSIS — E785 Hyperlipidemia, unspecified: Secondary | ICD-10-CM | POA: Diagnosis not present

## 2017-01-25 DIAGNOSIS — I87333 Chronic venous hypertension (idiopathic) with ulcer and inflammation of bilateral lower extremity: Secondary | ICD-10-CM | POA: Diagnosis not present

## 2017-01-25 DIAGNOSIS — R1011 Right upper quadrant pain: Secondary | ICD-10-CM | POA: Diagnosis not present

## 2017-01-25 DIAGNOSIS — Z9884 Bariatric surgery status: Secondary | ICD-10-CM | POA: Diagnosis not present

## 2017-01-25 DIAGNOSIS — L97222 Non-pressure chronic ulcer of left calf with fat layer exposed: Secondary | ICD-10-CM | POA: Diagnosis not present

## 2017-01-25 DIAGNOSIS — K219 Gastro-esophageal reflux disease without esophagitis: Secondary | ICD-10-CM | POA: Diagnosis not present

## 2017-01-25 DIAGNOSIS — R1310 Dysphagia, unspecified: Secondary | ICD-10-CM | POA: Diagnosis not present

## 2017-01-25 DIAGNOSIS — I87313 Chronic venous hypertension (idiopathic) with ulcer of bilateral lower extremity: Secondary | ICD-10-CM | POA: Diagnosis not present

## 2017-01-25 DIAGNOSIS — Z87891 Personal history of nicotine dependence: Secondary | ICD-10-CM | POA: Diagnosis not present

## 2017-01-25 DIAGNOSIS — M1990 Unspecified osteoarthritis, unspecified site: Secondary | ICD-10-CM | POA: Diagnosis not present

## 2017-01-25 DIAGNOSIS — K228 Other specified diseases of esophagus: Secondary | ICD-10-CM | POA: Diagnosis not present

## 2017-01-25 DIAGNOSIS — I110 Hypertensive heart disease with heart failure: Secondary | ICD-10-CM | POA: Diagnosis not present

## 2017-01-25 DIAGNOSIS — L97212 Non-pressure chronic ulcer of right calf with fat layer exposed: Secondary | ICD-10-CM | POA: Diagnosis not present

## 2017-01-25 DIAGNOSIS — I890 Lymphedema, not elsewhere classified: Secondary | ICD-10-CM | POA: Diagnosis not present

## 2017-01-25 DIAGNOSIS — I10 Essential (primary) hypertension: Secondary | ICD-10-CM | POA: Diagnosis not present

## 2017-02-01 ENCOUNTER — Encounter (HOSPITAL_BASED_OUTPATIENT_CLINIC_OR_DEPARTMENT_OTHER): Payer: Medicare HMO | Attending: Surgery

## 2017-02-01 DIAGNOSIS — I87333 Chronic venous hypertension (idiopathic) with ulcer and inflammation of bilateral lower extremity: Secondary | ICD-10-CM | POA: Diagnosis not present

## 2017-02-01 DIAGNOSIS — S81801A Unspecified open wound, right lower leg, initial encounter: Secondary | ICD-10-CM | POA: Diagnosis not present

## 2017-02-01 DIAGNOSIS — L97822 Non-pressure chronic ulcer of other part of left lower leg with fat layer exposed: Secondary | ICD-10-CM | POA: Diagnosis not present

## 2017-02-01 DIAGNOSIS — L97812 Non-pressure chronic ulcer of other part of right lower leg with fat layer exposed: Secondary | ICD-10-CM | POA: Insufficient documentation

## 2017-02-01 DIAGNOSIS — I10 Essential (primary) hypertension: Secondary | ICD-10-CM | POA: Insufficient documentation

## 2017-02-01 DIAGNOSIS — L97212 Non-pressure chronic ulcer of right calf with fat layer exposed: Secondary | ICD-10-CM | POA: Diagnosis not present

## 2017-02-01 DIAGNOSIS — I87312 Chronic venous hypertension (idiopathic) with ulcer of left lower extremity: Secondary | ICD-10-CM | POA: Diagnosis not present

## 2017-02-03 DIAGNOSIS — R1310 Dysphagia, unspecified: Secondary | ICD-10-CM | POA: Diagnosis not present

## 2017-02-03 DIAGNOSIS — K228 Other specified diseases of esophagus: Secondary | ICD-10-CM | POA: Diagnosis not present

## 2017-02-08 DIAGNOSIS — L97212 Non-pressure chronic ulcer of right calf with fat layer exposed: Secondary | ICD-10-CM | POA: Diagnosis not present

## 2017-02-08 DIAGNOSIS — L97812 Non-pressure chronic ulcer of other part of right lower leg with fat layer exposed: Secondary | ICD-10-CM | POA: Diagnosis not present

## 2017-02-08 DIAGNOSIS — I10 Essential (primary) hypertension: Secondary | ICD-10-CM | POA: Diagnosis not present

## 2017-02-08 DIAGNOSIS — L97229 Non-pressure chronic ulcer of left calf with unspecified severity: Secondary | ICD-10-CM | POA: Diagnosis not present

## 2017-02-08 DIAGNOSIS — L97822 Non-pressure chronic ulcer of other part of left lower leg with fat layer exposed: Secondary | ICD-10-CM | POA: Diagnosis not present

## 2017-02-08 DIAGNOSIS — I87333 Chronic venous hypertension (idiopathic) with ulcer and inflammation of bilateral lower extremity: Secondary | ICD-10-CM | POA: Diagnosis not present

## 2017-02-08 DIAGNOSIS — I87312 Chronic venous hypertension (idiopathic) with ulcer of left lower extremity: Secondary | ICD-10-CM | POA: Diagnosis not present

## 2017-02-14 ENCOUNTER — Encounter: Payer: Self-pay | Admitting: Family Medicine

## 2017-02-14 DIAGNOSIS — K224 Dyskinesia of esophagus: Secondary | ICD-10-CM

## 2017-02-14 HISTORY — DX: Dyskinesia of esophagus: K22.4

## 2017-02-14 NOTE — Progress Notes (Deleted)
   Subjective:    Patient ID: [name], female    DOB: 07/02/1956, 61 y.o.   MRN: [id number]  HPI 61 y/o female presents for routine follow up.   Venous Insufficiency Taking Lasix 40 mg daily, wears compression stockings, prescribed prn Oxycone  Anemia (Iron Deficiency) Iron increased to TID at last visit  HM Due for eye exam (history of diabetes)   Review of Systems     Objective:   Physical Exam There were no vitals taken for this visit.   Labs 3/15 Iron 53 TIBC 477 %SAT 11 Ferritin 18 B12 and Folate in normal range     Assessment & Plan:  No problem-specific Assessment & Plan notes found for this encounter.

## 2017-02-15 ENCOUNTER — Encounter: Payer: Self-pay | Admitting: *Deleted

## 2017-02-15 ENCOUNTER — Ambulatory Visit (INDEPENDENT_AMBULATORY_CARE_PROVIDER_SITE_OTHER): Payer: Medicare HMO | Admitting: *Deleted

## 2017-02-15 VITALS — BP 130/76 | HR 93 | Temp 98.4°F | Ht 64.0 in | Wt 177.8 lb

## 2017-02-15 DIAGNOSIS — I87312 Chronic venous hypertension (idiopathic) with ulcer of left lower extremity: Secondary | ICD-10-CM | POA: Diagnosis not present

## 2017-02-15 DIAGNOSIS — L97812 Non-pressure chronic ulcer of other part of right lower leg with fat layer exposed: Secondary | ICD-10-CM | POA: Diagnosis not present

## 2017-02-15 DIAGNOSIS — Z0000 Encounter for general adult medical examination without abnormal findings: Secondary | ICD-10-CM

## 2017-02-15 DIAGNOSIS — L97222 Non-pressure chronic ulcer of left calf with fat layer exposed: Secondary | ICD-10-CM | POA: Diagnosis not present

## 2017-02-15 DIAGNOSIS — I10 Essential (primary) hypertension: Secondary | ICD-10-CM | POA: Diagnosis not present

## 2017-02-15 DIAGNOSIS — I87333 Chronic venous hypertension (idiopathic) with ulcer and inflammation of bilateral lower extremity: Secondary | ICD-10-CM | POA: Diagnosis not present

## 2017-02-15 DIAGNOSIS — L97822 Non-pressure chronic ulcer of other part of left lower leg with fat layer exposed: Secondary | ICD-10-CM | POA: Diagnosis not present

## 2017-02-15 NOTE — Progress Notes (Signed)
Subjective:   [name] is a 61 y.o. female who presents for an Initial Medicare Annual Wellness Visit.  Cardiac Risk Factors include: diabetes mellitus;dyslipidemia;hypertension;obesity (BMI >30kg/m2)     Objective:    Today's Vitals   02/15/17 1440  BP: 130/76  Pulse: 93  Temp: 98.4 F (36.9 C)  TempSrc: Oral  SpO2: 99%  Weight: 177 lb 12.8 oz (80.6 kg)  Height: 5\' 4"  (1.626 m)  PainSc: 9   PainLoc: Leg   Body mass index is 30.52 kg/m.   Current Medications (verified) Outpatient Encounter Prescriptions as of 02/15/2017  Medication Sig  . aspirin 81 MG tablet Take 81 mg by mouth daily.  Marland Kitchen atorvastatin (LIPITOR) 40 MG tablet TAKE ONE TABLET EACH DAY  . calcium carbonate (OS-CAL) 600 MG TABS tablet Take 1 tablet (600 mg total) by mouth 2 (two) times daily with a meal.  . CARAFATE 1 GM/10ML suspension TAKE 2ml (2 TEASPOONSFUL) BY MOUTH 4 TIMES DAILY WITH MEALS AND ATBEDTIME  . cetirizine (ZYRTEC) 10 MG tablet Take 1 tablet (10 mg total) by mouth daily. (Patient taking differently: Take 10 mg by mouth daily as needed for allergies. )  . Cholecalciferol (D3 MAXIMUM STRENGTH) 5000 units capsule Take 5,000 Units by mouth daily.  . cyclobenzaprine (FLEXERIL) 5 MG tablet Take 1 tablet (5 mg total) by mouth 3 (three) times daily as needed for muscle spasms.  Marland Kitchen docusate sodium (DOK) 100 MG capsule TAKE ONE CAPSULE TWICE A DAY AS NEEDED FOR CONSTIPATION  . ferrous sulfate 220 (44 Fe) MG/5ML solution Take 5 mLs (220 mg total) by mouth 3 (three) times daily with meals.  . fluticasone (FLONASE) 50 MCG/ACT nasal spray Place 2 sprays into both nostrils at bedtime. (Patient taking differently: Place 2 sprays into both nostrils at bedtime as needed for allergies. )  . furosemide (LASIX) 40 MG tablet TAKE ONE TABLET BY MOUTH ONCE DAILY  . lisinopril (PRINIVIL,ZESTRIL) 20 MG tablet Take 1 tablet (20 mg total) by mouth daily.  . Multiple Vitamin (MULTIVITAMIN) tablet Take 1 tablet by  mouth daily.  . ondansetron (ZOFRAN) 4 MG tablet Take 1 tablet (4 mg total) by mouth every 6 (six) hours as needed for nausea.  . Oxycodone HCl 10 MG TABS Take 1 tablet (10 mg total) by mouth every 6 (six) hours as needed.  . pantoprazole (PROTONIX) 40 MG tablet Take 1 tablet (40 mg total) by mouth daily.  Marland Kitchen SANTYL ointment   . vitamin B-12 (CYANOCOBALAMIN) 1000 MCG tablet Take 1,000 mcg by mouth daily.  . vitamin C (ASCORBIC ACID) 250 MG tablet Take 250 mg by mouth daily.  . vitamin E 400 UNIT capsule Take 400 Units by mouth daily.  . Vitamins A & D 5000-400 units CAPS Take 5,000 Units by mouth daily.  Marland Kitchen zolpidem (AMBIEN) 10 MG tablet TAKE ONE TABLET AT BEDTIME AS NEEDED FOR SLEEP  . hydrochlorothiazide (MICROZIDE) 12.5 MG capsule TAKE ONE CAPSULE EACH DAY (Patient not taking: Reported on 02/15/2017)   No facility-administered encounter medications on file as of 02/15/2017.     Allergies (verified) Aspirin; Nsaids; and Tolmetin   History: Past Medical History:  Diagnosis Date  . Acute renal failure ([place]) 09/24/2016  . Anemia   . Arthritis    knees  . Back pain 10/04/2012  . Blood in stool 10/07/2014  . Blood transfusion without reported diagnosis   . Bunion 03/28/2014  . Cellulitis of leg, left 09/14/2016  . Chest pain 05/12/2015  . Cramping of  hands 02/27/2016  . 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 Degenerative arthritis of right shoulder region 08/2013  . Diverticulosis   . GERD (gastroesophageal reflux disease)   . High cholesterol   . History of GI bleed   . Hypertension    under control with meds., has been on med. > 20 yr.  . Intractable vomiting 09/24/2016  . Lower GI bleed   . Medial meniscus tear 1997   Right Knee  . Mini stroke ([place])   . Muscle pain 05/26/2015  . Rotator cuff rupture, complete 08/2013   right  . Shoulder impingement 08/2013   right  . Stroke [facility]) 2006   Remote left lacunar infarct noted  on CT head 2006, 2010   . Ulcer   . Wears dentures    upper  . Wears partial dentures    lower   Past Surgical History:  Procedure Laterality Date  . ABDOMINAL HYSTERECTOMY  ?1987   partial  . COLONOSCOPY  05/02/2000; 05/08/2013  . COSMETIC SURGERY  02/22/2014   skin removal surgery from lower abdomen   . ENDOVENOUS ABLATION SAPHENOUS VEIN W/ LASER Right 08/12/2016   endovenous laser ablation right greater saphenous vein by Curt Jews MD   . ENDOVENOUS ABLATION SAPHENOUS VEIN W/ LASER Left 10/21/2016   EVLA L GSV by Curt Jews MD  . METATARSAL OSTEOTOMY Right 1995   Right Bunion Repair  . 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 SHOULDER ARTHROSCOPY WITH ROTATOR CUFF REPAIR AND SUBACROMIAL DECOMPRESSION Right 08/09/2013   Procedure: RIGHT SHOULDER ARTHROSCOPY WITH SUBACROMIAL DECOMPRESSION, DISTAL CLAVICLE EXCISION AND ROTATOR CUFF REPAIR and release biceps;  Surgeon: Ninetta Lights, MD;  Location: [place];  Service: Orthopedics;  Laterality: Right;  . SUBTOTAL COLECTOMY  11/24/15   [place] Left   . TOTAL KNEE ARTHROPLASTY Right    Family History  Problem Relation Age of Onset  . Cancer Father 59    Died from complications of colon CA  . Colon cancer Father     died in his 6s  . Diabetes Mother   . Hypertension Mother   . Diabetes Sister   . Heart disease Sister   . Diabetes Sister   . Stroke Sister   . Diabetes Brother   . Stroke Brother   . Diabetes Brother   . Diabetes Brother   . Diabetes Brother   . 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 Smoking status: Former Smoker    Packs/day: 0.10    Years: 6.00    Quit date: 55  . Smokeless tobacco: Never Used  . Alcohol use 0.0 oz/week     Comment: occasional wine  . Drug use: No  . Sexual activity: Yes    Tobacco Counseling Counseling given: Yes Patient is former smoker with no plans to  restart   Activities of Daily Living In your present state of health, do you have any difficulty performing the following activities: 02/15/2017 02/15/2017  Hearing? Y N  Vision? - Y  Difficulty concentrating or making decisions? - Y  Walking or climbing stairs? - Y  Dressing or bathing? - N  Doing errands, shopping? - N  Conservation officer, nature and eating ? - N  Using the Toilet? - N  In the past six months, have you  accidently leaked urine? - N  Do you have problems with loss of bowel control? - N  Managing your Medications? - Y  Managing your Finances? - N  Housekeeping or managing your Housekeeping? - N  Some recent data might be hidden   Home Safety:  My home has a working smoke alarm:  Yes X 3           My home throw rugs have been fastened down to the floor or removed:  No, discussed removing or tacking down I have non-slip mats in the bathtub and shower:  Non-slip surface         All my home's stairs have railings or bannisters: Ground floor apt with no outside steps         My home's floors, stairs and hallways are free from clutter, wires and cords:  Yes     I wear seatbelts consistently:  Yes    Immunizations and Health Maintenance Immunization History  Administered Date(s) Administered  . Hepatitis B 09/07/2006, 11/18/2006, 03/22/2007  . Influenza Split 07/16/2011, 07/18/2012  . Influenza Whole 08/15/2007, 08/06/2008, 08/18/2009, 08/06/2010  . Influenza,inj,Quad PF,36+ Mos 10/19/2013, 07/25/2014, 01/25/2015, 07/17/2015, 08/05/2016, 09/25/2016  . PPD Test 07/18/2012, 04/23/2015  . Pneumococcal Polysaccharide-23 09/07/2006, 12/27/2012, 01/25/2015  . Td 01/31/2004  . Zoster 12/15/2016   Health Maintenance Due  Topic Date Due  . OPHTHALMOLOGY EXAM  01/09/2015  Patient has appt scheduled for next month with [facility]  Patient Care Team: [name], MD as PCP - General (Family Medicine) Dixie Dials, MD as Consulting Physician (Cardiology) Kennith Center, RD as  Dietitian (Family Medicine) Webb Laws, [place] as Referring Physician (Optometry) Camillo Flaming, OD as Consulting Physician (Optometry) Christin Fudge, MD as Consulting Physician (Surgery) [place]arr any recent Medical Services you may have received from other than [facility] providers in the past year (date may be approximate).     Assessment:   This is a routine wellness examination for [name].   Hearing/Vision screen  Hearing Screening   Method: Audiometry   125Hz  250Hz  500Hz  1000Hz  2000Hz  3000Hz  4000Hz  6000Hz  8000Hz   Right ear:   40 Fail Fail  Fail    Left ear:   40 Fail Fail  Fail      Dietary issues and exercise activities discussed: Current Exercise Habits: Structured exercise class, Type of exercise: strength training/weights (Bike/aerobics), Time (Minutes): 60, Frequency (Times/Week): 4, Weekly Exercise (Minutes/Week): 240, Intensity: Moderate  Goals    . Blood Pressure < 140/90    . HEMOGLOBIN A1C < 7.0    . Weight < 175 lb (79.379 kg)      Depression Screen PHQ 2/9 Scores 02/15/2017 01/13/2017 12/09/2016 11/12/2016 09/28/2016 09/17/2016 09/14/2016  PHQ - 2 Score 2 0 0 0 0 0 0  PHQ- 9 Score 9 0 0 0 0 0 0    Fall Risk Fall Risk  02/15/2017 01/13/2017 08/23/2016 06/08/2016 05/19/2016  Falls in the past year? No No No No No  Number falls in past yr: - - - - -  Risk for fall due to : Impaired balance/gait;Impaired mobility - - - -   Screening Tests  TUG Test:  Done in 12 seconds. Patient used both hands to push out of chair and to sit back down.  Cognitive Function: Mini-Cog  Passed with score 3/5   Health Maintenance  Topic Date Due  . OPHTHALMOLOGY EXAM  01/09/2015  . FOOT EXAM  02/26/2017  . INFLUENZA VACCINE  06/01/2017  .  LIPID PANEL  06/07/2017  . HEMOGLOBIN A1C  07/16/2017  . MAMMOGRAM  11/25/2018  . COLONOSCOPY  11/08/2024  . TETANUS/TDAP  07/25/2025  . PNEUMOCOCCAL POLYSACCHARIDE VACCINE  Completed  . Hepatitis C Screening  Completed  . HIV  Screening  Completed      Plan:     During the course of the visit, [name] was educated and counseled about the following appropriate screening and preventive services:   Vaccines to include Pneumoccal, Influenza, Td, Zostavax/Shingrix  Cardiovascular disease screening  Colorectal cancer screening  Bone density screening  Diabetes screening  Glaucoma screening  Mammography/PAP  Nutrition counseling  Patient Instructions (the written plan) were given to the patient.    Velora Heckler, RN   02/15/2017

## 2017-02-15 NOTE — Patient Instructions (Signed)
Fall Prevention in the Home Falls can cause injuries. They can happen to people of all ages. There are many things you can do to make your home safe and to help prevent falls. What can I do on the outside of my home?  Regularly fix the edges of walkways and driveways and fix any cracks.  Remove anything that might make you trip as you walk through a door, such as a raised step or threshold.  Trim any bushes or trees on the path to your home.  Use bright outdoor lighting.  Clear any walking paths of anything that might make someone trip, such as rocks or tools.  Regularly check to see if handrails are loose or broken. Make sure that both sides of any steps have handrails.  Any raised decks and porches should have guardrails on the edges.  Have any leaves, snow, or ice cleared regularly.  Use sand or salt on walking paths during winter.  Clean up any spills in your garage right away. This includes oil or grease spills. What can I do in the bathroom?  Use night lights.  Install grab bars by the toilet and in the tub and shower. Do not use towel bars as grab bars.  Use non-skid mats or decals in the tub or shower.  If you need to sit down in the shower, use a plastic, non-slip stool.  Keep the floor dry. Clean up any water that spills on the floor as soon as it happens.  Remove soap buildup in the tub or shower regularly.  Attach bath mats securely with double-sided non-slip rug tape.  Do not have throw rugs and other things on the floor that can make you trip. What can I do in the bedroom?  Use night lights.  Make sure that you have a light by your bed that is easy to reach.  Do not use any sheets or blankets that are too big for your bed. They should not hang down onto the floor.  Have a firm chair that has side arms. You can use this for support while you get dressed.  Do not have throw rugs and other things on the floor that can make you trip. What can I do in  the kitchen?  Clean up any spills right away.  Avoid walking on wet floors.  Keep items that you use a lot in easy-to-reach places.  If you need to reach something above you, use a strong step stool that has a grab bar.  Keep electrical cords out of the way.  Do not use floor polish or wax that makes floors slippery. If you must use wax, use non-skid floor wax.  Do not have throw rugs and other things on the floor that can make you trip. What can I do with my stairs?  Do not leave any items on the stairs.  Make sure that there are handrails on both sides of the stairs and use them. Fix handrails that are broken or loose. Make sure that handrails are as long as the stairways.  Check any carpeting to make sure that it is firmly attached to the stairs. Fix any carpet that is loose or worn.  Avoid having throw rugs at the top or bottom of the stairs. If you do have throw rugs, attach them to the floor with carpet tape.  Make sure that you have a light switch at the top of the stairs and the bottom of the stairs. If you  do not have them, ask someone to add them for you. What else can I do to help prevent falls?  Wear shoes that:  Do not have high heels.  Have rubber bottoms.  Are comfortable and fit you well.  Are closed at the toe. Do not wear sandals.  If you use a stepladder:  Make sure that it is fully opened. Do not climb a closed stepladder.  Make sure that both sides of the stepladder are locked into place.  Ask someone to hold it for you, if possible.  Clearly mark and make sure that you can see:  Any grab bars or handrails.  First and last steps.  Where the edge of each step is.  Use tools that help you move around (mobility aids) if they are needed. These include:  Canes.  Walkers.  Scooters.  Crutches.  Turn on the lights when you go into a dark area. Replace any light bulbs as soon as they burn out.  Set up your furniture so you have a clear  path. Avoid moving your furniture around.  If any of your floors are uneven, fix them.  If there are any pets around you, be aware of where they are.  Review your medicines with your doctor. Some medicines can make you feel dizzy. This can increase your chance of falling. Ask your doctor what other things that you can do to help prevent falls. This information is not intended to replace advice given to you by your health care provider. Make sure you discuss any questions you have with your health care provider. Document Released: 08/14/2009 Document Revised: 03/25/2016 Document Reviewed: 11/22/2014 Elsevier Interactive Patient Education  2017 [place] Maintenance, Female Adopting a healthy lifestyle and getting preventive care can go a long way to promote health and wellness. Talk with your health care provider about what schedule of regular examinations is right for you. This is a good chance for you to check in with your provider about disease prevention and staying healthy. In between checkups, there are plenty of things you can do on your own. Experts have done a lot of research about which lifestyle changes and preventive measures are most likely to keep you healthy. Ask your health care provider for more information. Weight and diet Eat a healthy diet  Be sure to include plenty of vegetables, fruits, low-fat dairy products, and lean protein.  Do not eat a lot of foods high in solid fats, added sugars, or salt.  Get regular exercise. This is one of the most important things you can do for your health.  Most adults should exercise for at least 150 minutes each week. The exercise should increase your heart rate and make you sweat (moderate-intensity exercise).  Most adults should also do strengthening exercises at least twice a week. This is in addition to the moderate-intensity exercise. Maintain a healthy weight  Body mass index (BMI) is a measurement that can be used  to identify possible weight problems. It estimates body fat based on height and weight. Your health care provider can help determine your BMI and help you achieve or maintain a healthy weight.  For females 45 years of age and older:  A BMI below 18.5 is considered underweight.  A BMI of 18.5 to 24.9 is normal.  A BMI of 25 to 29.9 is considered overweight.  A BMI of 30 and above is considered obese. Watch levels of cholesterol and blood lipids  You should start having  your blood tested for lipids and cholesterol at 61 years of age, then have this test every 5 years.  You may need to have your cholesterol levels checked more often if:  Your lipid or cholesterol levels are high.  You are older than 61 years of age.  You are at high risk for heart disease. Cancer screening Lung Cancer  Lung cancer screening is recommended for adults 55-80 years old who are at high risk for lung cancer because of a history of smoking.  A yearly low-dose CT scan of the lungs is recommended for people who:  Currently smoke.  Have quit within the past 15 years.  Have at least a 30-pack-year history of smoking. A pack year is smoking an average of one pack of cigarettes a day for 1 year.  Yearly screening should continue until it has been 15 years since you quit.  Yearly screening should stop if you develop a health problem that would prevent you from having lung cancer treatment. Breast Cancer  Practice breast self-awareness. This means understanding how your breasts normally appear and feel.  It also means doing regular breast self-exams. Let your health care provider know about any changes, no matter how small.  If you are in your 20s or 30s, you should have a clinical breast exam (CBE) by a health care provider every 1-3 years as part of a regular health exam.  If you are 40 or older, have a CBE every year. Also consider having a breast X-ray (mammogram) every year.  If you have a family  history of breast cancer, talk to your health care provider about genetic screening.  If you are at high risk for breast cancer, talk to your health care provider about having an MRI and a mammogram every year.  Breast cancer gene (BRCA) assessment is recommended for women who have family members with BRCA-related cancers. BRCA-related cancers include:  Breast.  Ovarian.  Tubal.  Peritoneal cancers.  Results of the assessment will determine the need for genetic counseling and BRCA1 and BRCA2 testing. Cervical Cancer  Your health care provider may recommend that you be screened regularly for cancer of the pelvic organs (ovaries, uterus, and vagina). This screening involves a pelvic examination, including checking for microscopic changes to the surface of your cervix (Pap test). You may be encouraged to have this screening done every 3 years, beginning at age 21.  For women ages 30-65, health care providers may recommend pelvic exams and Pap testing every 3 years, or they may recommend the Pap and pelvic exam, combined with testing for human papilloma virus (HPV), every 5 years. Some types of HPV increase your risk of cervical cancer. Testing for HPV may also be done on women of any age with unclear Pap test results.  Other health care providers may not recommend any screening for nonpregnant women who are considered low risk for pelvic cancer and who do not have symptoms. Ask your health care provider if a screening pelvic exam is right for you.  If you have had past treatment for cervical cancer or a condition that could lead to cancer, you need Pap tests and screening for cancer for at least 20 years after your treatment. If Pap tests have been discontinued, your risk factors (such as having a new sexual partner) need to be reassessed to determine if screening should resume. Some women have medical problems that increase the chance of getting cervical cancer. In these cases, your health care  provider may   recommend more frequent screening and Pap tests. Colorectal Cancer  This type of cancer can be detected and often prevented.  Routine colorectal cancer screening usually begins at 61 years of age and continues through 61 years of age.  Your health care provider may recommend screening at an earlier age if you have risk factors for colon cancer.  Your health care provider may also recommend using home test kits to check for hidden blood in the stool.  A small camera at the end of a tube can be used to examine your colon directly (sigmoidoscopy or colonoscopy). This is done to check for the earliest forms of colorectal cancer.  Routine screening usually begins at age 50.  Direct examination of the colon should be repeated every 5-10 years through 61 years of age. However, you may need to be screened more often if early forms of precancerous polyps or small growths are found. Skin Cancer  Check your skin from head to toe regularly.  Tell your health care provider about any new moles or changes in moles, especially if there is a change in a mole's shape or color.  Also tell your health care provider if you have a mole that is larger than the size of a pencil eraser.  Always use sunscreen. Apply sunscreen liberally and repeatedly throughout the day.  Protect yourself by wearing long sleeves, pants, a wide-brimmed hat, and sunglasses whenever you are outside. Heart disease, diabetes, and high blood pressure  High blood pressure causes heart disease and increases the risk of stroke. High blood pressure is more likely to develop in:  People who have blood pressure in the high end of the normal range (130-139/85-89 mm Hg).  People who are overweight or obese.  People who are African American.  If you are 18-39 years of age, have your blood pressure checked every 3-5 years. If you are 40 years of age or older, have your blood pressure checked every year. You should have your  blood pressure measured twice-once when you are at a hospital or clinic, and once when you are not at a hospital or clinic. Record the average of the two measurements. To check your blood pressure when you are not at a hospital or clinic, you can use:  An automated blood pressure machine at a pharmacy.  A home blood pressure monitor.  If you are between 55 years and 79 years old, ask your health care provider if you should take aspirin to prevent strokes.  Have regular diabetes screenings. This involves taking a blood sample to check your fasting blood sugar level.  If you are at a normal weight and have a low risk for diabetes, have this test once every three years after 61 years of age.  If you are overweight and have a high risk for diabetes, consider being tested at a younger age or more often. Preventing infection Hepatitis B  If you have a higher risk for hepatitis B, you should be screened for this virus. You are considered at high risk for hepatitis B if:  You were born in a country where hepatitis B is common. Ask your health care provider which countries are considered high risk.  Your parents were born in a high-risk country, and you have not been immunized against hepatitis B (hepatitis B vaccine).  You have HIV or AIDS.  You use needles to inject street drugs.  You live with someone who has hepatitis B.  You have had sex with someone who   has hepatitis B.  You get hemodialysis treatment.  You take certain medicines for conditions, including cancer, organ transplantation, and autoimmune conditions. Hepatitis C  Blood testing is recommended for:  Everyone born from [birth year] through 1965.  Anyone with known risk factors for hepatitis C. Sexually transmitted infections (STIs)  You should be screened for sexually transmitted infections (STIs) including gonorrhea and chlamydia if:  You are sexually active and are younger than 61 years of age.  You are older than 61  years of age and your health care provider tells you that you are at risk for this type of infection.  Your sexual activity has changed since you were last screened and you are at an increased risk for chlamydia or gonorrhea. Ask your health care provider if you are at risk.  If you do not have HIV, but are at risk, it may be recommended that you take a prescription medicine daily to prevent HIV infection. This is called pre-exposure prophylaxis (PrEP). You are considered at risk if:  You are sexually active and do not regularly use condoms or know the HIV status of your partner(s).  You take drugs by injection.  You are sexually active with a partner who has HIV. Talk with your health care provider about whether you are at high risk of being infected with HIV. If you choose to begin PrEP, you should first be tested for HIV. You should then be tested every 3 months for as long as you are taking PrEP. Pregnancy  If you are premenopausal and you may become pregnant, ask your health care provider about preconception counseling.  If you may become pregnant, take 400 to 800 micrograms (mcg) of folic acid every day.  If you want to prevent pregnancy, talk to your health care provider about birth control (contraception). Osteoporosis and menopause  Osteoporosis is a disease in which the bones lose minerals and strength with aging. This can result in serious bone fractures. Your risk for osteoporosis can be identified using a bone density scan.  If you are 64 years of age or older, or if you are at risk for osteoporosis and fractures, ask your health care provider if you should be screened.  Ask your health care provider whether you should take a calcium or vitamin D supplement to lower your risk for osteoporosis.  Menopause may have certain physical symptoms and risks.  Hormone replacement therapy may reduce some of these symptoms and risks. Talk to your health care provider about whether  hormone replacement therapy is right for you. Follow these instructions at home:  Schedule regular health, dental, and eye exams.  Stay current with your immunizations.  Do not use any tobacco products including cigarettes, chewing tobacco, or electronic cigarettes.  If you are pregnant, do not drink alcohol.  If you are breastfeeding, limit how much and how often you drink alcohol.  Limit alcohol intake to no more than 1 drink per day for nonpregnant women. One drink equals 12 ounces of beer, 5 ounces of wine, or 1 ounces of hard liquor.  Do not use street drugs.  Do not share needles.  Ask your health care provider for help if you need support or information about quitting drugs.  Tell your health care provider if you often feel depressed.  Tell your health care provider if you have ever been abused or do not feel safe at home. This information is not intended to replace advice given to you by your health care provider. Make  sure you discuss any questions you have with your health care provider. Document Released: 05/03/2011 Document Revised: 03/25/2016 Document Reviewed: 07/22/2015 Elsevier Interactive Patient Education  2017 [employer]   Hearing Loss Hearing loss is a partial or total loss of the ability to hear. This can be temporary or permanent, and it can happen in one or both ears. Hearing loss may be referred to as deafness. Medical care is necessary to treat hearing loss properly and to prevent the condition from getting worse. Your hearing may partially or completely come back, depending on what caused your hearing loss and how severe it is. In some cases, hearing loss is permanent. What are the causes? Common causes of hearing loss include:  Too much wax in the ear canal.  Infection of the ear canal or middle ear.  Fluid in the middle ear.  Injury to the ear or surrounding area.  An object stuck in the ear.  Prolonged exposure to loud sounds, such as  music. Less common causes of hearing loss include:  Tumors in the ear.  Viral or bacterial infections, such as meningitis.  A hole in the eardrum (perforated eardrum).  Problems with the hearing nerve that sends signals between the brain and the ear.  Certain medicines. What are the signs or symptoms? Symptoms of this condition may include:  Difficulty telling the difference between sounds.  Difficulty following a conversation when there is background noise.  Lack of response to sounds in your environment. This may be most noticeable when you do not respond to startling sounds.  Needing to turn up the volume on the television, radio, etc.  Ringing in the ears.  Dizziness.  Pain in the ears. How is this diagnosed? This condition is diagnosed based on a physical exam and a hearing test (audiometry). The audiometry test will be performed by a hearing specialist (audiologist). You may also be referred to an ear, nose, and throat (ENT) specialist (otolaryngologist). How is this treated? Treatment for recent onset of hearing loss may include:  Ear wax removal.  Being prescribed medicines to prevent infection (antibiotics).  Being prescribed medicines to reduce inflammation (corticosteroids). Follow these instructions at home:  If you were prescribed an antibiotic medicine, take it as told by your health care provider. Do not stop taking the antibiotic even if you start to feel better.  Take over-the-counter and prescription medicines only as told by your health care provider.  Avoid loud noises.  Return to your normal activities as told by your health care provider. Ask your health care provider what activities are safe for you.  Keep all follow-up visits as told by your health care provider. This is important. Contact a health care provider if:  You feel dizzy.  You develop new symptoms.  You vomit or feel nauseous.  You have a fever. Get help right away  if:  You develop sudden changes in your vision.  You have severe ear pain.  You have new or increased weakness.  You have a severe headache. This information is not intended to replace advice given to you by your health care provider. Make sure you discuss any questions you have with your health care provider. Document Released: 10/18/2005 Document Revised: 03/25/2016 Document Reviewed: 03/05/2015 Elsevier Interactive Patient Education  2017 [employer].

## 2017-02-16 ENCOUNTER — Encounter: Payer: Self-pay | Admitting: *Deleted

## 2017-02-16 NOTE — Progress Notes (Signed)
I have reviewed this visit and discussed with Lauren Ducatte, RN, BSN, and agree with her documentation.   Ridgely Anastacio MD 

## 2017-02-18 ENCOUNTER — Ambulatory Visit: Payer: Medicare HMO | Admitting: Family Medicine

## 2017-02-22 DIAGNOSIS — L97222 Non-pressure chronic ulcer of left calf with fat layer exposed: Secondary | ICD-10-CM | POA: Diagnosis not present

## 2017-02-22 DIAGNOSIS — L97812 Non-pressure chronic ulcer of other part of right lower leg with fat layer exposed: Secondary | ICD-10-CM | POA: Diagnosis not present

## 2017-02-22 DIAGNOSIS — L97822 Non-pressure chronic ulcer of other part of left lower leg with fat layer exposed: Secondary | ICD-10-CM | POA: Diagnosis not present

## 2017-02-22 DIAGNOSIS — I87312 Chronic venous hypertension (idiopathic) with ulcer of left lower extremity: Secondary | ICD-10-CM | POA: Diagnosis not present

## 2017-02-22 DIAGNOSIS — I10 Essential (primary) hypertension: Secondary | ICD-10-CM | POA: Diagnosis not present

## 2017-02-22 DIAGNOSIS — L97212 Non-pressure chronic ulcer of right calf with fat layer exposed: Secondary | ICD-10-CM | POA: Diagnosis not present

## 2017-02-22 DIAGNOSIS — I87333 Chronic venous hypertension (idiopathic) with ulcer and inflammation of bilateral lower extremity: Secondary | ICD-10-CM | POA: Diagnosis not present

## 2017-02-23 ENCOUNTER — Other Ambulatory Visit: Payer: Self-pay | Admitting: Family Medicine

## 2017-02-24 DIAGNOSIS — Z6829 Body mass index (BMI) 29.0-29.9, adult: Secondary | ICD-10-CM | POA: Diagnosis not present

## 2017-02-24 DIAGNOSIS — Z713 Dietary counseling and surveillance: Secondary | ICD-10-CM | POA: Diagnosis not present

## 2017-02-24 DIAGNOSIS — E663 Overweight: Secondary | ICD-10-CM | POA: Diagnosis not present

## 2017-02-24 DIAGNOSIS — Z9884 Bariatric surgery status: Secondary | ICD-10-CM | POA: Diagnosis not present

## 2017-02-28 ENCOUNTER — Other Ambulatory Visit: Payer: Self-pay | Admitting: Student

## 2017-02-28 ENCOUNTER — Encounter: Payer: Self-pay | Admitting: Student

## 2017-02-28 ENCOUNTER — Ambulatory Visit (INDEPENDENT_AMBULATORY_CARE_PROVIDER_SITE_OTHER): Payer: Medicare HMO | Admitting: Student

## 2017-02-28 ENCOUNTER — Ambulatory Visit (HOSPITAL_COMMUNITY)
Admission: RE | Admit: 2017-02-28 | Discharge: 2017-02-28 | Disposition: A | Discharge status: Home / Self Care | Payer: Medicare HMO | Source: Ambulatory Visit | Attending: Family Medicine | Admitting: Family Medicine

## 2017-02-28 VITALS — BP 118/70 | HR 99 | Temp 98.2°F | Wt 181.0 lb

## 2017-02-28 DIAGNOSIS — M19012 Primary osteoarthritis, left shoulder: Secondary | ICD-10-CM | POA: Diagnosis not present

## 2017-02-28 DIAGNOSIS — M7582 Other shoulder lesions, left shoulder: Secondary | ICD-10-CM | POA: Diagnosis not present

## 2017-02-28 DIAGNOSIS — M25511 Pain in right shoulder: Secondary | ICD-10-CM

## 2017-02-28 DIAGNOSIS — M25512 Pain in left shoulder: Secondary | ICD-10-CM | POA: Diagnosis not present

## 2017-02-28 NOTE — Assessment & Plan Note (Addendum)
Left  shoulder pain with discomfort with shoulder motion concerning for rotator cuff pathology given pain is similar what she had with right rotator cuff pathology requiring repair.  - xray to rule out bony pathology - patient to follow with [facility] of [facility] where she had her previous surgery

## 2017-02-28 NOTE — Patient Instructions (Signed)
Obtain right shoulder xray Follow up with Sports medicine Try motrin or tylenol for pain If you have questions or concerns, call the office at [phone]

## 2017-02-28 NOTE — Progress Notes (Addendum)
   Subjective:    Patient ID: [name], female    DOB: 1955/11/13, 61 y.o.   MRN: [id number]   CC: left shoulder pain  HPI: 61 y/o F presents for left shoulder pain  Left shoulder pain - started 3 days ago - no known injury - she does have a history of rotator cuff surgery on the right one year ago - she does work out regularly with biceps curls and bench press - she has continued to do this but with reduced weight  - reports some weakness due to pain  Smoking status reviewed  Review of Systems  Per HPI, else denies chest pain, shortness of breath,     Objective:  BP 118/70   Pulse 99   Temp 98.2 F (36.8 C) (Oral)   Wt 181 lb (82.1 kg)   SpO2 98%   BMI 31.07 kg/m  Vitals and nursing note reviewed  General: NAD Cardiac: RRR,  Respiratory: CTAB, normal effort MSK: left shoulder with mild tenderness to palpation, shoulder abduction limited by pain but able to raise arm above head, no reduction in ability to reach behind her back 5/5 UE strength on right, 4/5 LE strength on left normal sensation bilaterally, normal strength to external rotation of the shoulder bilaterally, negative empty can test Skin: warm and dry, no rashes noted Neuro: alert and oriented, no focal deficits   Assessment & Plan:    Left shoulder pain Left  shoulder pain with discomfort with shoulder motion concerning for rotator cuff pathology given pain is similar what she had with right rotator cuff pathology requiring repair.  - xray to rule out bony pathology - patient to follow with [facility] of [facility] where she had her previous surgery    Clive Parcel A. Lincoln Brigham MD, [facility] Resident PGY-3 Pager [phone]

## 2017-03-01 ENCOUNTER — Encounter (HOSPITAL_BASED_OUTPATIENT_CLINIC_OR_DEPARTMENT_OTHER): Payer: Medicare HMO | Attending: Surgery

## 2017-03-01 DIAGNOSIS — I890 Lymphedema, not elsewhere classified: Secondary | ICD-10-CM | POA: Insufficient documentation

## 2017-03-01 DIAGNOSIS — I87312 Chronic venous hypertension (idiopathic) with ulcer of left lower extremity: Secondary | ICD-10-CM | POA: Diagnosis not present

## 2017-03-01 DIAGNOSIS — I87333 Chronic venous hypertension (idiopathic) with ulcer and inflammation of bilateral lower extremity: Secondary | ICD-10-CM | POA: Insufficient documentation

## 2017-03-01 DIAGNOSIS — L97222 Non-pressure chronic ulcer of left calf with fat layer exposed: Secondary | ICD-10-CM | POA: Insufficient documentation

## 2017-03-01 DIAGNOSIS — L97212 Non-pressure chronic ulcer of right calf with fat layer exposed: Secondary | ICD-10-CM | POA: Insufficient documentation

## 2017-03-01 DIAGNOSIS — S81801A Unspecified open wound, right lower leg, initial encounter: Secondary | ICD-10-CM | POA: Diagnosis not present

## 2017-03-01 DIAGNOSIS — I10 Essential (primary) hypertension: Secondary | ICD-10-CM | POA: Insufficient documentation

## 2017-03-01 DIAGNOSIS — Z9884 Bariatric surgery status: Secondary | ICD-10-CM | POA: Diagnosis not present

## 2017-03-01 DIAGNOSIS — Z9049 Acquired absence of other specified parts of digestive tract: Secondary | ICD-10-CM | POA: Insufficient documentation

## 2017-03-01 NOTE — Progress Notes (Signed)
   Subjective:    Patient ID: [name], female    DOB: 01/14/1956, 61 y.o.   MRN: [id number]  HPI 61 y/o female presents for routine follow up.  Chronic leg pain (due to venous insufficiency with chronic wounds) Currently prescribed Oxycodone 10 mg Q6 hours prn pain, last prescribed 60 tablets on 01/13/17. Takes mostly takes one at night, may take an additional one during the day if pain level is too high (needs second pill 3 times per week), pain level currently controlled as took Oxy this AM, Right leg pain has resolved, now mostly left leg (undergoing intermittent skin grafts).   Left shoulder pain Evaluated on 02/28/17 by Dr. Lincoln Brigham, sent for xray and referred back to [facility] (Orthopedics) - patient never received phone call for referral. Pain for the past week. Radiates down her left arm.  Previous right shoulder arthroscopy, no previous procedures on the left, takes ES Tylenol which provides little relief   HM Due to DM eye and foot exam.    Review of Systems See above    Objective:   Physical Exam BP 140/84   Pulse 82   Temp 97.8 F (36.6 C) (Oral)   Ht 5\' 4"  (1.626 m)   Wt 179 lb 9.6 oz (81.5 kg)   SpO2 100%   BMI 30.83 kg/m   Gen: pleasant female, NAD Ext: LLE - 8 by 6 cm superficial wound, overlying skin graft, drainage present, no erythema; RLE - 7 by 3.5 cm superficial skin would, granulation tissue present, no erythema or drainage, 2+ edema of bilateral LE MSK: Cervical - no tenderness, ROM is full, neg Spurlings bilaterally; Left Shoulder - No tenderness, no erythema, +pain with Neer, + pain with Hawkings, 5/5 strength with Empty Can, External Rotation, and Lift off Test  Xray Left Shoulder IMPRESSION: Moderate AC joint and minimal glenohumeral degenerative spur formation.  Procedure Note: Left Subacromial Shoulder Injection  The risks and benefits of the procedure were discussed with the patient. Written consent was obtained. The posterior shoulder was  prepped in a sterile fashion. A total of 40 mg DepoMedrol and 3 cc Lidocaine without Epi were injected into the subacromial space using a 25 gauge 1.5 inch need. The patient tolerate the procedure well. Hemostasis achieved. BandAid applied. No complications.       Assessment & Plan:  Venous (peripheral) insufficiency Stable from previous visits. Continue Lasix and Compression stockings.   Left shoulder pain Continued symptoms. 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 Rotator strength intact. Exam consistent with subacromial bursitis.  - subacromial injection performed today - provided home exercise routine - encouraged regular ice to the area - can hold on Orthopedic Referral at this time, however consider Sports Med referral for [place] if symptoms persist.   Multiple open wounds of lower leg Right Wound improved s/p multiple skin grafts.  Left wound healing with skin grafts (still causes pain) Refill of Oxycodone provided while patient continues to undergo skin grafts

## 2017-03-03 ENCOUNTER — Encounter: Payer: Self-pay | Admitting: Family Medicine

## 2017-03-03 ENCOUNTER — Ambulatory Visit (INDEPENDENT_AMBULATORY_CARE_PROVIDER_SITE_OTHER): Payer: Medicare HMO | Admitting: Family Medicine

## 2017-03-03 VITALS — BP 140/84 | HR 82 | Temp 97.8°F | Ht 64.0 in | Wt 179.6 lb

## 2017-03-03 DIAGNOSIS — I872 Venous insufficiency (chronic) (peripheral): Secondary | ICD-10-CM | POA: Diagnosis not present

## 2017-03-03 DIAGNOSIS — M25512 Pain in left shoulder: Secondary | ICD-10-CM | POA: Diagnosis not present

## 2017-03-03 DIAGNOSIS — S81809D Unspecified open wound, unspecified lower leg, subsequent encounter: Secondary | ICD-10-CM

## 2017-03-03 MED ORDER — OXYCODONE HCL 10 MG PO TABS: 10.0000 mg | ORAL_TABLET | Freq: Four times a day (QID) | ORAL | 0 refills | Status: DC | PRN

## 2017-03-03 MED ORDER — METHYLPREDNISOLONE ACETATE 40 MG/ML IJ SUSP
40.0000 mg | Freq: Once | INTRAMUSCULAR | Status: AC
  Administered 2017-03-03: 40 mg via INTRAMUSCULAR

## 2017-03-03 NOTE — Assessment & Plan Note (Addendum)
Stable from previous visits. Continue Lasix and Compression stockings.

## 2017-03-03 NOTE — Assessment & Plan Note (Signed)
Continued symptoms. 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 Rotator strength intact. Exam consistent with subacromial bursitis.  - subacromial injection performed today - provided home exercise routine - encouraged regular ice to the area - can hold on Orthopedic Referral at this time, however consider Sports Med referral for [place] if symptoms persist.

## 2017-03-03 NOTE — Assessment & Plan Note (Signed)
Right Wound improved s/p multiple skin grafts.  Left wound healing with skin grafts (still causes pain) Refill of Oxycodone provided while patient continues to undergo skin grafts

## 2017-03-03 NOTE — Patient Instructions (Signed)
It was nice to see you today.  Legs - I am happy to hear that your leg pain is improving, I have refilled the Oxycodone, we will try to wean this as tolerated  Left shoulder pain - you have arthritis that is likely contributing to your pain, you also appear to have an inflammed bursa (fluid sac) in your shoulder, you were given a steroid injection today that will hopefully improve your pain, please perform the home exercises that I have given you, ice 2-3 times per day for 15 minutes, call my office if shoulder pain is not improved in 2 weeks.

## 2017-03-08 DIAGNOSIS — I10 Essential (primary) hypertension: Secondary | ICD-10-CM | POA: Diagnosis not present

## 2017-03-08 DIAGNOSIS — Z9884 Bariatric surgery status: Secondary | ICD-10-CM | POA: Diagnosis not present

## 2017-03-08 DIAGNOSIS — Z9049 Acquired absence of other specified parts of digestive tract: Secondary | ICD-10-CM | POA: Diagnosis not present

## 2017-03-08 DIAGNOSIS — L97212 Non-pressure chronic ulcer of right calf with fat layer exposed: Secondary | ICD-10-CM | POA: Diagnosis not present

## 2017-03-08 DIAGNOSIS — I87312 Chronic venous hypertension (idiopathic) with ulcer of left lower extremity: Secondary | ICD-10-CM | POA: Diagnosis not present

## 2017-03-08 DIAGNOSIS — L97229 Non-pressure chronic ulcer of left calf with unspecified severity: Secondary | ICD-10-CM | POA: Diagnosis not present

## 2017-03-08 DIAGNOSIS — I87333 Chronic venous hypertension (idiopathic) with ulcer and inflammation of bilateral lower extremity: Secondary | ICD-10-CM | POA: Diagnosis not present

## 2017-03-08 DIAGNOSIS — L97219 Non-pressure chronic ulcer of right calf with unspecified severity: Secondary | ICD-10-CM | POA: Diagnosis not present

## 2017-03-08 DIAGNOSIS — L97222 Non-pressure chronic ulcer of left calf with fat layer exposed: Secondary | ICD-10-CM | POA: Diagnosis not present

## 2017-03-08 DIAGNOSIS — I890 Lymphedema, not elsewhere classified: Secondary | ICD-10-CM | POA: Diagnosis not present

## 2017-03-15 DIAGNOSIS — Z9049 Acquired absence of other specified parts of digestive tract: Secondary | ICD-10-CM | POA: Diagnosis not present

## 2017-03-15 DIAGNOSIS — I10 Essential (primary) hypertension: Secondary | ICD-10-CM | POA: Diagnosis not present

## 2017-03-15 DIAGNOSIS — L97222 Non-pressure chronic ulcer of left calf with fat layer exposed: Secondary | ICD-10-CM | POA: Diagnosis not present

## 2017-03-15 DIAGNOSIS — I87312 Chronic venous hypertension (idiopathic) with ulcer of left lower extremity: Secondary | ICD-10-CM | POA: Diagnosis not present

## 2017-03-15 DIAGNOSIS — S81801A Unspecified open wound, right lower leg, initial encounter: Secondary | ICD-10-CM | POA: Diagnosis not present

## 2017-03-15 DIAGNOSIS — I87333 Chronic venous hypertension (idiopathic) with ulcer and inflammation of bilateral lower extremity: Secondary | ICD-10-CM | POA: Diagnosis not present

## 2017-03-15 DIAGNOSIS — L97212 Non-pressure chronic ulcer of right calf with fat layer exposed: Secondary | ICD-10-CM | POA: Diagnosis not present

## 2017-03-15 DIAGNOSIS — I890 Lymphedema, not elsewhere classified: Secondary | ICD-10-CM | POA: Diagnosis not present

## 2017-03-15 DIAGNOSIS — Z9884 Bariatric surgery status: Secondary | ICD-10-CM | POA: Diagnosis not present

## 2017-03-22 DIAGNOSIS — I87313 Chronic venous hypertension (idiopathic) with ulcer of bilateral lower extremity: Secondary | ICD-10-CM | POA: Diagnosis not present

## 2017-03-22 DIAGNOSIS — L97222 Non-pressure chronic ulcer of left calf with fat layer exposed: Secondary | ICD-10-CM | POA: Diagnosis not present

## 2017-03-22 DIAGNOSIS — Z9049 Acquired absence of other specified parts of digestive tract: Secondary | ICD-10-CM | POA: Diagnosis not present

## 2017-03-22 DIAGNOSIS — Z9884 Bariatric surgery status: Secondary | ICD-10-CM | POA: Diagnosis not present

## 2017-03-22 DIAGNOSIS — I87333 Chronic venous hypertension (idiopathic) with ulcer and inflammation of bilateral lower extremity: Secondary | ICD-10-CM | POA: Diagnosis not present

## 2017-03-22 DIAGNOSIS — L97212 Non-pressure chronic ulcer of right calf with fat layer exposed: Secondary | ICD-10-CM | POA: Diagnosis not present

## 2017-03-22 DIAGNOSIS — I890 Lymphedema, not elsewhere classified: Secondary | ICD-10-CM | POA: Diagnosis not present

## 2017-03-22 DIAGNOSIS — I10 Essential (primary) hypertension: Secondary | ICD-10-CM | POA: Diagnosis not present

## 2017-03-29 ENCOUNTER — Telehealth: Payer: Self-pay | Admitting: Family Medicine

## 2017-03-29 DIAGNOSIS — Z9884 Bariatric surgery status: Secondary | ICD-10-CM | POA: Diagnosis not present

## 2017-03-29 DIAGNOSIS — I890 Lymphedema, not elsewhere classified: Secondary | ICD-10-CM | POA: Diagnosis not present

## 2017-03-29 DIAGNOSIS — I10 Essential (primary) hypertension: Secondary | ICD-10-CM | POA: Diagnosis not present

## 2017-03-29 DIAGNOSIS — I87333 Chronic venous hypertension (idiopathic) with ulcer and inflammation of bilateral lower extremity: Secondary | ICD-10-CM | POA: Diagnosis not present

## 2017-03-29 DIAGNOSIS — L97212 Non-pressure chronic ulcer of right calf with fat layer exposed: Secondary | ICD-10-CM | POA: Diagnosis not present

## 2017-03-29 DIAGNOSIS — Z9049 Acquired absence of other specified parts of digestive tract: Secondary | ICD-10-CM | POA: Diagnosis not present

## 2017-03-29 DIAGNOSIS — L97222 Non-pressure chronic ulcer of left calf with fat layer exposed: Secondary | ICD-10-CM | POA: Diagnosis not present

## 2017-03-29 DIAGNOSIS — I87313 Chronic venous hypertension (idiopathic) with ulcer of bilateral lower extremity: Secondary | ICD-10-CM | POA: Diagnosis not present

## 2017-03-29 NOTE — Telephone Encounter (Signed)
Pt would like to talk to dr fletke. She needs a refill on her pain pills and muscle relaxer. She understands she should be trying to wean off of them.  She would like to explain to dr fletke what the wound center told her.

## 2017-03-29 NOTE — Telephone Encounter (Signed)
2nd request. ep °

## 2017-03-30 NOTE — Progress Notes (Signed)
   Subjective:    Patient ID: [name], female    DOB: May 23, 1956, 61 y.o.   MRN: [id number]  HPI 61 y/o female presents for follow up of leg pain/wounds.  Leg pain/wounds/venous insufficiency History of bilateral skin graft, taking Oxycodone 1-2 tablets per day and Flexeril as needed. Taking Tylenol 500 four times per day as well.  Reports worsening left leg pain, 10/10. Wound care team told her that pain is due to muscles/vasculature re-connecting.  Right leg pain controlled now that healed Taking Lasix as prescribed. Swelling improved.   Left Shoulder Pain Received steroid injection at last visit for suspected subacromial bursitis/impinegment syndrome. Pain is resolved.   Right shoulder pain Previous rotator cuff surgery, fell on shoulder a few weeks ago and now has pain, 5/10, worse with elevation overhead  Rash Right leg, one week, itches  HM Due for DM foot and eye exam  Review of Systems See above.     Objective:   Physical Exam BP 140/78   Pulse 83   Temp 98.3 F (36.8 C) (Oral)   Ht 5\' 4"  (1.626 m)   Wt 177 lb 12.8 oz (80.6 kg)   SpO2 99%   BMI 30.52 kg/m   Gen: pleasant female, NAD Ext: bilateral LE wounds Skin: right inner leg maculopapular hyperpigmented lesion with scale. Bilateral Lateral leg wounds (see pictures below). No surrounding erythema or drainage.  MSK: Right Shoulder-no tenderness, strength 5/5 empty can/ER/lift off, pain with Neer's and Hawking's     Right Leg   Left Leg   Procedure Note: Right Subacromial Shoulder Injection  The risks and benefits of the procedure were discussed with the patient. Written consent was obtained. The posterior shoulder was prepped in a sterile fashion. A total of 40 mg DepoMedrol and 5 cc Lidocaine without Epi were injected into the subacromial space using a 25 gauge 1.5 inch need. The patient tolerate the procedure well. Hemostasis achieved. BandAid applied.       Assessment & Plan:  Right  shoulder pain Right shoulder pain after injury. Previous rotator cuff repair. Rotator cuff strength intact. Has impingement signs.  -steroid injection of shoulder -if no improvement will need to return to orthopedics for further evaluation  Left shoulder pain Improved with steroid injection.   Multiple open wounds of lower leg Slowly improved with wound care/skin grafts. Still having significant pain and muscle spasm. -refills of Oxycodone and Flexeril provided  Rash and nonspecific skin eruption Rash of right medial leg consistent with likely contact dermatitis. -trial of steroid cream (Triamcinolone) -consider biopsy if does not resolve

## 2017-03-30 NOTE — Telephone Encounter (Signed)
Pt called again. She still wants to talk to dr Ree Kida about her meds

## 2017-03-30 NOTE — Telephone Encounter (Signed)
Returned patient call. Needs refills of flexeril and Narcotic. She states that she has enough to last until appointment on 6/1. Will discuss refills at that time.

## 2017-04-01 ENCOUNTER — Ambulatory Visit (INDEPENDENT_AMBULATORY_CARE_PROVIDER_SITE_OTHER): Payer: Medicare HMO | Admitting: Family Medicine

## 2017-04-01 ENCOUNTER — Encounter: Payer: Self-pay | Admitting: Family Medicine

## 2017-04-01 VITALS — BP 140/78 | HR 83 | Temp 98.3°F | Ht 64.0 in | Wt 177.8 lb

## 2017-04-01 DIAGNOSIS — M25519 Pain in unspecified shoulder: Secondary | ICD-10-CM

## 2017-04-01 DIAGNOSIS — R21 Rash and other nonspecific skin eruption: Secondary | ICD-10-CM

## 2017-04-01 DIAGNOSIS — M25512 Pain in left shoulder: Secondary | ICD-10-CM

## 2017-04-01 DIAGNOSIS — S81809D Unspecified open wound, unspecified lower leg, subsequent encounter: Secondary | ICD-10-CM

## 2017-04-01 DIAGNOSIS — M25511 Pain in right shoulder: Secondary | ICD-10-CM | POA: Diagnosis not present

## 2017-04-01 HISTORY — DX: Rash and other nonspecific skin eruption: R21

## 2017-04-01 MED ORDER — CYCLOBENZAPRINE HCL 5 MG PO TABS: 5.0000 mg | ORAL_TABLET | Freq: Three times a day (TID) | ORAL | 0 refills | Status: DC | PRN

## 2017-04-01 MED ORDER — TRIAMCINOLONE ACETONIDE 0.5 % EX OINT: 1.0000 "application " | TOPICAL_OINTMENT | Freq: Two times a day (BID) | CUTANEOUS | 0 refills | Status: DC

## 2017-04-01 MED ORDER — METHYLPREDNISOLONE ACETATE 40 MG/ML IJ SUSP
40.0000 mg | Freq: Once | INTRAMUSCULAR | Status: AC
  Administered 2017-04-01: 40 mg via INTRAMUSCULAR

## 2017-04-01 MED ORDER — OXYCODONE HCL 10 MG PO TABS: 10.0000 mg | ORAL_TABLET | Freq: Four times a day (QID) | ORAL | 0 refills | Status: DC | PRN

## 2017-04-01 NOTE — Assessment & Plan Note (Signed)
Right shoulder pain after injury. Previous rotator cuff repair. Rotator cuff strength intact. Has impingement signs.  -steroid injection of shoulder -if no improvement will need to return to orthopedics for further evaluation

## 2017-04-01 NOTE — Assessment & Plan Note (Signed)
Slowly improved with wound care/skin grafts. Still having significant pain and muscle spasm. -refills of Oxycodone and Flexeril provided

## 2017-04-01 NOTE — Assessment & Plan Note (Signed)
Improved with steroid injection

## 2017-04-01 NOTE — Patient Instructions (Addendum)
It was good to see you today! Start using the steroid cream once a day. Apply just enough to cover the areas bothering you. Ice the right shoulder for at least 30 mins if you can. Give [place] a call if the shoulder pain persist for another week despite the steroid injection.

## 2017-04-01 NOTE — Assessment & Plan Note (Signed)
Rash of right medial leg consistent with likely contact dermatitis. -trial of steroid cream (Triamcinolone) -consider biopsy if does not resolve

## 2017-04-05 ENCOUNTER — Encounter (HOSPITAL_BASED_OUTPATIENT_CLINIC_OR_DEPARTMENT_OTHER): Payer: Medicare HMO | Attending: Surgery

## 2017-04-05 DIAGNOSIS — Z9049 Acquired absence of other specified parts of digestive tract: Secondary | ICD-10-CM | POA: Insufficient documentation

## 2017-04-05 DIAGNOSIS — I87313 Chronic venous hypertension (idiopathic) with ulcer of bilateral lower extremity: Secondary | ICD-10-CM | POA: Diagnosis not present

## 2017-04-05 DIAGNOSIS — L97222 Non-pressure chronic ulcer of left calf with fat layer exposed: Secondary | ICD-10-CM | POA: Insufficient documentation

## 2017-04-05 DIAGNOSIS — I890 Lymphedema, not elsewhere classified: Secondary | ICD-10-CM | POA: Insufficient documentation

## 2017-04-05 DIAGNOSIS — Z9884 Bariatric surgery status: Secondary | ICD-10-CM | POA: Diagnosis not present

## 2017-04-05 DIAGNOSIS — I110 Hypertensive heart disease with heart failure: Secondary | ICD-10-CM | POA: Insufficient documentation

## 2017-04-05 DIAGNOSIS — I509 Heart failure, unspecified: Secondary | ICD-10-CM | POA: Diagnosis not present

## 2017-04-05 DIAGNOSIS — L97212 Non-pressure chronic ulcer of right calf with fat layer exposed: Secondary | ICD-10-CM | POA: Diagnosis not present

## 2017-04-05 DIAGNOSIS — I87322 Chronic venous hypertension (idiopathic) with inflammation of left lower extremity: Secondary | ICD-10-CM | POA: Insufficient documentation

## 2017-04-05 DIAGNOSIS — S81801A Unspecified open wound, right lower leg, initial encounter: Secondary | ICD-10-CM | POA: Diagnosis not present

## 2017-04-05 DIAGNOSIS — I83012 Varicose veins of right lower extremity with ulcer of calf: Secondary | ICD-10-CM | POA: Insufficient documentation

## 2017-04-07 ENCOUNTER — Other Ambulatory Visit: Payer: Self-pay | Admitting: Family Medicine

## 2017-04-07 DIAGNOSIS — K219 Gastro-esophageal reflux disease without esophagitis: Secondary | ICD-10-CM

## 2017-04-12 ENCOUNTER — Encounter: Payer: Self-pay | Admitting: Podiatry

## 2017-04-12 ENCOUNTER — Telehealth: Payer: Self-pay | Admitting: Family Medicine

## 2017-04-12 ENCOUNTER — Ambulatory Visit (INDEPENDENT_AMBULATORY_CARE_PROVIDER_SITE_OTHER): Payer: Medicare HMO | Admitting: Podiatry

## 2017-04-12 DIAGNOSIS — M79674 Pain in right toe(s): Secondary | ICD-10-CM | POA: Diagnosis not present

## 2017-04-12 DIAGNOSIS — E119 Type 2 diabetes mellitus without complications: Secondary | ICD-10-CM

## 2017-04-12 DIAGNOSIS — B351 Tinea unguium: Secondary | ICD-10-CM

## 2017-04-12 DIAGNOSIS — I890 Lymphedema, not elsewhere classified: Secondary | ICD-10-CM | POA: Diagnosis not present

## 2017-04-12 DIAGNOSIS — I83012 Varicose veins of right lower extremity with ulcer of calf: Secondary | ICD-10-CM | POA: Diagnosis not present

## 2017-04-12 DIAGNOSIS — I87322 Chronic venous hypertension (idiopathic) with inflammation of left lower extremity: Secondary | ICD-10-CM | POA: Diagnosis not present

## 2017-04-12 DIAGNOSIS — I110 Hypertensive heart disease with heart failure: Secondary | ICD-10-CM | POA: Diagnosis not present

## 2017-04-12 DIAGNOSIS — M79675 Pain in left toe(s): Secondary | ICD-10-CM | POA: Diagnosis not present

## 2017-04-12 DIAGNOSIS — L97222 Non-pressure chronic ulcer of left calf with fat layer exposed: Secondary | ICD-10-CM | POA: Diagnosis not present

## 2017-04-12 DIAGNOSIS — Z9049 Acquired absence of other specified parts of digestive tract: Secondary | ICD-10-CM | POA: Diagnosis not present

## 2017-04-12 DIAGNOSIS — S81801A Unspecified open wound, right lower leg, initial encounter: Secondary | ICD-10-CM | POA: Diagnosis not present

## 2017-04-12 DIAGNOSIS — I509 Heart failure, unspecified: Secondary | ICD-10-CM | POA: Diagnosis not present

## 2017-04-12 DIAGNOSIS — I87312 Chronic venous hypertension (idiopathic) with ulcer of left lower extremity: Secondary | ICD-10-CM | POA: Diagnosis not present

## 2017-04-12 DIAGNOSIS — Z9884 Bariatric surgery status: Secondary | ICD-10-CM | POA: Diagnosis not present

## 2017-04-12 DIAGNOSIS — L97212 Non-pressure chronic ulcer of right calf with fat layer exposed: Secondary | ICD-10-CM | POA: Diagnosis not present

## 2017-04-12 NOTE — Patient Instructions (Signed)

## 2017-04-12 NOTE — Progress Notes (Signed)
Patient ID: [name], female   DOB: 11-24-1955, 61 y.o.   MRN: [id number]    Subjective: This patient presents today complaining of uncomfortable toenails walking wearing shoes and requests toenail debridement. Patient states that she is currently under care for wound on her right lower extremity for the past 3 months on an ongoing and continuing basis  Objective: Orientated 3 DP and PT pulses 2/4 bilaterally Reflex immediate bilaterally Bilateral mild peripheral edema bilaterally Lower t legs have gauze dressing with drainage noted through bandage without malodor Sensation to 10 g monofilament wire intact 5/5 bilaterally Vibratory sensation reactive bilaterally Ankle reflex equal and reactive bilaterally Absent hair growth Toenails are hypertrophic, elongated, deformed and tender to direct palpation 6-10 HAV bilaterally Manual motor testing dorsi flexion, plantar flexion, inversion, eversion 5/5 bilaterally  Assessment: Diabetic with satisfactory neurovascular status Lower extremity bilateral wound under management wound care center Symptomatic onychomycoses 6-10  Plan: Debridement of toenails 6-10 mechanically an electrician without any bleeding  Reappoint 3 months

## 2017-04-12 NOTE — Telephone Encounter (Signed)
Pt is still concerned about the spots on her leg.  They are no better. She wants to talk to dr fletke abou tthem.

## 2017-04-13 NOTE — Telephone Encounter (Signed)
Patient states that cream is no working and that MD stated they could do a biopsy. Spoke with Dr. Ree Kida and scheduled patient with Dr. Yisroel Ramming on 6/19.

## 2017-04-15 ENCOUNTER — Ambulatory Visit: Payer: Medicare HMO | Admitting: Family Medicine

## 2017-04-18 NOTE — Progress Notes (Deleted)
   Subjective:   Patient ID: [name]    DOB: Apr 24, 1956, 61 y.o. female   MRN: [id number]  CC: "***"  HPI: [name] is a 61 y.o. female who presents to clinic today ***. Problems discussed today are as follows:  ***: *** ROS: ***  ***Seen 6/12 by podiatry for nail debridement. Has area which patient wants biopsied due to ineffective results with cream per Dr. Ree Kida.  Complete ROS performed, see HPI for pertinent.  [place]: HTN, h/o CVA, PVD, h/o NIDDM, GERD, DDD, OA, obesity s/p gastric bypass, IDA. H/o b/l knee arthroplasty, subtotal colectomy, R-rotator cuff repair, abdominal hysterectomy. Father h/o colon CA, mother h/o DM, HTN, sister h/o DM, stroke. Smoking status reviewed. Medications reviewed.  Objective:   There were no vitals taken for this visit. Vitals and nursing note reviewed.  General: well nourished, well developed, in no acute distress with non-toxic appearance HEENT: normocephalic, atraumatic, moist mucous membranes Neck: supple, non-tender without lymphadenopathy CV: regular rate and rhythm without murmurs, rubs, or gallops, no lower extremity edema Lungs: clear to auscultation bilaterally with normal work of breathing Abdomen: soft, non-tender, non-distended, no masses or organomegaly palpable, normoactive bowel sounds Skin: warm, dry, no rashes or lesions, cap refill < 2 seconds Extremities: warm and well perfused, normal tone  Assessment & Plan:   No problem-specific Assessment & Plan notes found for this encounter.  No orders of the defined types were placed in this encounter.  No orders of the defined types were placed in this encounter.   [name], [place], PGY-1 04/18/2017 11:59 AM

## 2017-04-19 ENCOUNTER — Ambulatory Visit: Payer: Medicare HMO | Admitting: Family Medicine

## 2017-04-19 ENCOUNTER — Other Ambulatory Visit: Payer: Self-pay | Admitting: Family Medicine

## 2017-04-19 ENCOUNTER — Telehealth: Payer: Self-pay

## 2017-04-19 DIAGNOSIS — M25569 Pain in unspecified knee: Secondary | ICD-10-CM | POA: Insufficient documentation

## 2017-04-19 DIAGNOSIS — I890 Lymphedema, not elsewhere classified: Secondary | ICD-10-CM | POA: Diagnosis not present

## 2017-04-19 DIAGNOSIS — M25562 Pain in left knee: Principal | ICD-10-CM

## 2017-04-19 DIAGNOSIS — Z9884 Bariatric surgery status: Secondary | ICD-10-CM | POA: Diagnosis not present

## 2017-04-19 DIAGNOSIS — M25561 Pain in right knee: Secondary | ICD-10-CM

## 2017-04-19 DIAGNOSIS — L97212 Non-pressure chronic ulcer of right calf with fat layer exposed: Secondary | ICD-10-CM | POA: Diagnosis not present

## 2017-04-19 DIAGNOSIS — Z9049 Acquired absence of other specified parts of digestive tract: Secondary | ICD-10-CM | POA: Diagnosis not present

## 2017-04-19 DIAGNOSIS — I110 Hypertensive heart disease with heart failure: Secondary | ICD-10-CM | POA: Diagnosis not present

## 2017-04-19 DIAGNOSIS — I83012 Varicose veins of right lower extremity with ulcer of calf: Secondary | ICD-10-CM | POA: Diagnosis not present

## 2017-04-19 DIAGNOSIS — I87322 Chronic venous hypertension (idiopathic) with inflammation of left lower extremity: Secondary | ICD-10-CM | POA: Diagnosis not present

## 2017-04-19 DIAGNOSIS — I509 Heart failure, unspecified: Secondary | ICD-10-CM | POA: Diagnosis not present

## 2017-04-19 DIAGNOSIS — I87312 Chronic venous hypertension (idiopathic) with ulcer of left lower extremity: Secondary | ICD-10-CM | POA: Diagnosis not present

## 2017-04-19 DIAGNOSIS — L97222 Non-pressure chronic ulcer of left calf with fat layer exposed: Secondary | ICD-10-CM | POA: Diagnosis not present

## 2017-04-19 MED ORDER — GABAPENTIN 300 MG PO CAPS: 300.0000 mg | ORAL_CAPSULE | Freq: Three times a day (TID) | ORAL | 1 refills | Status: DC

## 2017-04-19 NOTE — Telephone Encounter (Signed)
Pt would like to talk to Dr. Ree Kida. She has some nerve pain and can't get into to see Dr. Mingo Amber until July 20th. She would like to discuss this with Dr. Ree Kida to ask what she should do from here. She would like his opinion on who she can see until her appt with Dr. Mingo Amber.  "Dr. Ree Kida knows me and how I am". Ottis Stain, CMA

## 2017-04-19 NOTE — Telephone Encounter (Signed)
After reconsideration will provide Gabapentin prescription instead.

## 2017-04-19 NOTE — Progress Notes (Deleted)
   Subjective:   Patient ID: [name]    DOB: [birth year], 61 y.o. female   MRN: [id number]  CC: "***"  HPI: [name] is a 61 y.o. female who presents to clinic today ***. Problems discussed today are as follows:  ***: *** ROS: ***  Complete ROS performed, see HPI for pertinent.  [place]: HTN, asthma, GERD, h/o T2DM. H/o Roux-en-Y, abdominal hysterectomy. Mother h/o DM, HTN, father h/o colon cancer, sister h/o DM, heart disease, DM, stroke. Smoking status reviewed. Medications reviewed.  Objective:   There were no vitals taken for this visit. Vitals and nursing note reviewed.  General: well nourished, well developed, in no acute distress with non-toxic appearance HEENT: normocephalic, atraumatic, moist mucous membranes Neck: supple, non-tender without lymphadenopathy CV: regular rate and rhythm without murmurs, rubs, or gallops, no lower extremity edema Lungs: clear to auscultation bilaterally with normal work of breathing Abdomen: soft, non-tender, non-distended, no masses or organomegaly palpable, normoactive bowel sounds Skin: warm, dry, no rashes or lesions, cap refill < 2 seconds Extremities: warm and well perfused, normal tone  Assessment & Plan:   No problem-specific Assessment & Plan notes found for this encounter.  No orders of the defined types were placed in this encounter.  No orders of the defined types were placed in this encounter.   Harriet Butte, [place], PGY-1 04/19/2017 9:08 AM

## 2017-04-19 NOTE — Addendum Note (Signed)
Addended by: [name] on: 04/19/2017 02:23 PM   Modules accepted: Orders

## 2017-04-19 NOTE — Telephone Encounter (Signed)
Patient was recently seen in wound clinic. Complained of sharp shoot pain. Thought maybe nerve pain. Will attempt trial of Lyrica.

## 2017-04-21 ENCOUNTER — Ambulatory Visit: Payer: Medicare HMO | Admitting: Family Medicine

## 2017-04-21 NOTE — Telephone Encounter (Signed)
Pt says the gabapentin isnt helping with her leg pain.  Please advise

## 2017-04-21 NOTE — Progress Notes (Deleted)
   Subjective:   Patient ID: [name]    DOB: June 26, 1956, 61 y.o. female   MRN: [id number]  CC: "***"  HPI: [name] is a 61 y.o. female who presents to clinic today ***. Problems discussed today are as follows:  ***: *** ROS: ***  Complete ROS performed, see HPI for pertinent.  [place]: HTN, asthma, GERD, h/o T2DM. H/o Roux-en-Y, abdominal hysterectomy. Mother h/o DM, HTN, father h/o colon cancer, sister h/o DM, heart disease, DM, stroke. Smoking status reviewed. Medications reviewed.  Objective:   There were no vitals taken for this visit. Vitals and nursing note reviewed.  General: well nourished, well developed, in no acute distress with non-toxic appearance HEENT: normocephalic, atraumatic, moist mucous membranes Neck: supple, non-tender without lymphadenopathy CV: regular rate and rhythm without murmurs, rubs, or gallops, no lower extremity edema Lungs: clear to auscultation bilaterally with normal work of breathing Abdomen: soft, non-tender, non-distended, no masses or organomegaly palpable, normoactive bowel sounds Skin: warm, dry, no rashes or lesions, cap refill < 2 seconds Extremities: warm and well perfused, normal tone  Assessment & Plan:   No problem-specific Assessment & Plan notes found for this encounter.  No orders of the defined types were placed in this encounter.  No orders of the defined types were placed in this encounter.   Harriet Butte, [place], PGY-1 04/21/2017 8:40 AM

## 2017-04-22 ENCOUNTER — Other Ambulatory Visit: Payer: Self-pay | Admitting: Family Medicine

## 2017-04-22 ENCOUNTER — Ambulatory Visit (INDEPENDENT_AMBULATORY_CARE_PROVIDER_SITE_OTHER): Payer: Medicare HMO | Admitting: Family Medicine

## 2017-04-22 ENCOUNTER — Encounter: Payer: Self-pay | Admitting: Family Medicine

## 2017-04-22 VITALS — BP 128/80 | HR 94 | Temp 98.3°F | Ht 64.0 in | Wt 180.0 lb

## 2017-04-22 DIAGNOSIS — M25562 Pain in left knee: Principal | ICD-10-CM

## 2017-04-22 DIAGNOSIS — L989 Disorder of the skin and subcutaneous tissue, unspecified: Secondary | ICD-10-CM

## 2017-04-22 DIAGNOSIS — M25561 Pain in right knee: Secondary | ICD-10-CM

## 2017-04-22 DIAGNOSIS — L308 Other specified dermatitis: Secondary | ICD-10-CM | POA: Diagnosis not present

## 2017-04-22 MED ORDER — GABAPENTIN 300 MG PO CAPS: 600.0000 mg | ORAL_CAPSULE | Freq: Three times a day (TID) | ORAL | 1 refills | Status: DC

## 2017-04-22 NOTE — Patient Instructions (Signed)
Thank you for coming in to see [place] today. Please see below to review our plan for today's visit.  1. I will notify you the pathology results. In the meantime continue the triamcinolone ointment as directed. 2. Avoid itching the area as this will worsen the area. 3. Return to clinic if bleeding persists or area biopsied becomes red and swollen.  Please call the clinic at [phone] if your symptoms worsen or you have any concerns. It was my pleasure to see you. -- Harriet Butte, [place], PGY-1

## 2017-04-22 NOTE — Telephone Encounter (Signed)
Pt informed while in the office. Averlee Swartz Kennon Holter, CMA

## 2017-04-22 NOTE — Addendum Note (Signed)
Addended byYisroel Ramming, DAVID J on: 04/22/2017 12:12 PM   Modules accepted: Orders

## 2017-04-22 NOTE — Progress Notes (Signed)
   Subjective:   Patient ID: [name]    DOB: Apr 14, 1956, 61 y.o. female   MRN: [id number]  CC: "Itchy spots"  HPI: [name] is a 61 y.o. female who presents to clinic today for itchy spots. Problems discussed today are as follows:  Itchy spots: Onset 2 weeks ago affecting shins with diffuse itchy spots. Patient was seen 1 week ago and prescribed triamcinolone ointment to apply to affected area. Left side has improved with ointment the right side persists. Spots are nonpainful spare other body surfaces. Patient currently on aspirin 81 mg daily. Followed by wound care for left lower shin wound secondary to diabetes. The spots are not consistent with chronic wound. Patient called clinic and was told to come in for punch biopsy. Patient asking to proceed with procedure. ROS: Denies fevers or chills, pain on affected area.   Complete ROS performed, see HPI for pertinent.  [place]: HTN, asthma, GERD, h/o T2DM. H/o Roux-en-Y, abdominal hysterectomy. Mother h/o DM, HTN, father h/o colon cancer, sister h/o DM, heart disease, DM, stroke. Smoking status reviewed. Medications reviewed.  Objective:   BP 128/80   Pulse 94   Temp 98.3 F (36.8 C) (Oral)   Ht 5\' 4"  (1.626 m)   Wt 180 lb (81.6 kg)   SpO2 100%   BMI 30.90 kg/m  Vitals and nursing note reviewed.  General: well nourished, well developed, in no acute distress with non-toxic appearance CV: regular rate and rhythm without murmurs, rubs, or gallops, no lower extremity edema Lungs: clear to auscultation bilaterally with normal work of breathing Skin: warm, dry, cap refill < 2 seconds, try dressing on left lower shin, multiple hyperpigmented macular lesions measuring 2-5 mm punch shins bilaterally with crusting-like appearance Extremities: warm and well perfused, normal tone    Procedure Note: Punch Biopsy Written and Verbal Consent obtained. Risks and benefits of procedure discussed with patient. Area prepped in sterile  fashion. Using a 5 cc syringe and 25 gauge 1.5 inch needle approximately 1 cc of 1% Lidocaine with Epinephrine was instilled into the lesion. A 3 mm punch was performed. Using pickups and iris scissors the biopsy specimen was removed. Hemostasis was achieved. No complications. Band-aid applied. Specimen sent for pathology.   Assessment & Plan:   Skin lesion of right lower extremity Acute. Macular like lesions on shins bilaterally. To be eczema-like and responding well to triamcinolone but persist. Patient requesting punch biopsy for definitive diagnosis. --Punch biopsy performed, see procedure note for details, tolerated procedure well --Instructed patient to continue triamcinolone ointment  No orders of the defined types were placed in this encounter.  No orders of the defined types were placed in this encounter.   Harriet Butte, [place], PGY-1 04/22/2017 10:07 AM

## 2017-04-22 NOTE — Assessment & Plan Note (Signed)
Acute. Macular like lesions on shins bilaterally. To be eczema-like and responding well to triamcinolone but persist. Patient requesting punch biopsy for definitive diagnosis. --Punch biopsy performed, see procedure note for details, tolerated procedure well --Instructed patient to continue triamcinolone ointment

## 2017-04-22 NOTE — Telephone Encounter (Signed)
RN staff - please call patient and have her increase Gabapentin to 600 mg TID. Inform her that we started at a small dose and may need to taper upwards.

## 2017-04-26 ENCOUNTER — Telehealth: Payer: Self-pay | Admitting: Family Medicine

## 2017-04-26 ENCOUNTER — Encounter: Payer: Self-pay | Admitting: Family Medicine

## 2017-04-26 DIAGNOSIS — L97222 Non-pressure chronic ulcer of left calf with fat layer exposed: Secondary | ICD-10-CM | POA: Diagnosis not present

## 2017-04-26 DIAGNOSIS — L97212 Non-pressure chronic ulcer of right calf with fat layer exposed: Secondary | ICD-10-CM | POA: Diagnosis not present

## 2017-04-26 DIAGNOSIS — I87313 Chronic venous hypertension (idiopathic) with ulcer of bilateral lower extremity: Secondary | ICD-10-CM | POA: Diagnosis not present

## 2017-04-26 DIAGNOSIS — I110 Hypertensive heart disease with heart failure: Secondary | ICD-10-CM | POA: Diagnosis not present

## 2017-04-26 DIAGNOSIS — I83012 Varicose veins of right lower extremity with ulcer of calf: Secondary | ICD-10-CM | POA: Diagnosis not present

## 2017-04-26 DIAGNOSIS — I87322 Chronic venous hypertension (idiopathic) with inflammation of left lower extremity: Secondary | ICD-10-CM | POA: Diagnosis not present

## 2017-04-26 DIAGNOSIS — I509 Heart failure, unspecified: Secondary | ICD-10-CM | POA: Diagnosis not present

## 2017-04-26 DIAGNOSIS — S81801A Unspecified open wound, right lower leg, initial encounter: Secondary | ICD-10-CM | POA: Diagnosis not present

## 2017-04-26 DIAGNOSIS — Z9049 Acquired absence of other specified parts of digestive tract: Secondary | ICD-10-CM | POA: Diagnosis not present

## 2017-04-26 DIAGNOSIS — Z9884 Bariatric surgery status: Secondary | ICD-10-CM | POA: Diagnosis not present

## 2017-04-26 DIAGNOSIS — I890 Lymphedema, not elsewhere classified: Secondary | ICD-10-CM | POA: Diagnosis not present

## 2017-04-26 NOTE — Telephone Encounter (Signed)
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 Patient states rash continues to improve with the use of triamcinolone ointment. Biopsy area is healing well without complications per patient. Recommended continuing ointment in the meantime. Patient without further questions or concerns. -- Harriet Butte, [place], PGY-1

## 2017-05-03 ENCOUNTER — Encounter (HOSPITAL_BASED_OUTPATIENT_CLINIC_OR_DEPARTMENT_OTHER): Payer: Medicare HMO | Attending: Surgery

## 2017-05-03 DIAGNOSIS — I87332 Chronic venous hypertension (idiopathic) with ulcer and inflammation of left lower extremity: Secondary | ICD-10-CM | POA: Insufficient documentation

## 2017-05-03 DIAGNOSIS — I83212 Varicose veins of right lower extremity with both ulcer of calf and inflammation: Secondary | ICD-10-CM | POA: Insufficient documentation

## 2017-05-03 DIAGNOSIS — L97212 Non-pressure chronic ulcer of right calf with fat layer exposed: Secondary | ICD-10-CM | POA: Diagnosis not present

## 2017-05-03 DIAGNOSIS — I890 Lymphedema, not elsewhere classified: Secondary | ICD-10-CM | POA: Insufficient documentation

## 2017-05-03 DIAGNOSIS — L97222 Non-pressure chronic ulcer of left calf with fat layer exposed: Secondary | ICD-10-CM | POA: Diagnosis not present

## 2017-05-03 DIAGNOSIS — Z9884 Bariatric surgery status: Secondary | ICD-10-CM | POA: Insufficient documentation

## 2017-05-03 DIAGNOSIS — I110 Hypertensive heart disease with heart failure: Secondary | ICD-10-CM | POA: Diagnosis not present

## 2017-05-03 DIAGNOSIS — I509 Heart failure, unspecified: Secondary | ICD-10-CM | POA: Insufficient documentation

## 2017-05-03 DIAGNOSIS — S81801A Unspecified open wound, right lower leg, initial encounter: Secondary | ICD-10-CM | POA: Diagnosis not present

## 2017-05-05 DIAGNOSIS — S81801A Unspecified open wound, right lower leg, initial encounter: Secondary | ICD-10-CM | POA: Diagnosis not present

## 2017-05-09 ENCOUNTER — Other Ambulatory Visit: Payer: Self-pay | Admitting: Family Medicine

## 2017-05-09 DIAGNOSIS — M25562 Pain in left knee: Principal | ICD-10-CM

## 2017-05-09 DIAGNOSIS — M25561 Pain in right knee: Secondary | ICD-10-CM

## 2017-05-09 MED ORDER — GABAPENTIN 300 MG PO CAPS: 600.0000 mg | ORAL_CAPSULE | Freq: Three times a day (TID) | ORAL | 0 refills | Status: DC

## 2017-05-09 NOTE — Telephone Encounter (Signed)
Pt is calling for a refill on her Gabapentin. She said that the doctor increased the dosage, so she has run out. Please call in. jw

## 2017-05-10 DIAGNOSIS — I87332 Chronic venous hypertension (idiopathic) with ulcer and inflammation of left lower extremity: Secondary | ICD-10-CM | POA: Diagnosis not present

## 2017-05-10 DIAGNOSIS — L97222 Non-pressure chronic ulcer of left calf with fat layer exposed: Secondary | ICD-10-CM | POA: Diagnosis not present

## 2017-05-10 DIAGNOSIS — I110 Hypertensive heart disease with heart failure: Secondary | ICD-10-CM | POA: Diagnosis not present

## 2017-05-10 DIAGNOSIS — I509 Heart failure, unspecified: Secondary | ICD-10-CM | POA: Diagnosis not present

## 2017-05-10 DIAGNOSIS — I87313 Chronic venous hypertension (idiopathic) with ulcer of bilateral lower extremity: Secondary | ICD-10-CM | POA: Diagnosis not present

## 2017-05-10 DIAGNOSIS — I890 Lymphedema, not elsewhere classified: Secondary | ICD-10-CM | POA: Diagnosis not present

## 2017-05-10 DIAGNOSIS — I83212 Varicose veins of right lower extremity with both ulcer of calf and inflammation: Secondary | ICD-10-CM | POA: Diagnosis not present

## 2017-05-10 DIAGNOSIS — L97212 Non-pressure chronic ulcer of right calf with fat layer exposed: Secondary | ICD-10-CM | POA: Diagnosis not present

## 2017-05-10 DIAGNOSIS — Z9884 Bariatric surgery status: Secondary | ICD-10-CM | POA: Diagnosis not present

## 2017-05-16 ENCOUNTER — Other Ambulatory Visit: Payer: Self-pay | Admitting: *Deleted

## 2017-05-16 MED ORDER — POLYETHYLENE GLYCOL 3350 17 GM/SCOOP PO POWD: 17.0000 g | Freq: Two times a day (BID) | ORAL | 1 refills | Status: DC | PRN

## 2017-05-16 NOTE — Telephone Encounter (Signed)
This was not on her med list but received a request from the pharmacy. [name], [name], CMA

## 2017-05-17 DIAGNOSIS — L97222 Non-pressure chronic ulcer of left calf with fat layer exposed: Secondary | ICD-10-CM | POA: Diagnosis not present

## 2017-05-17 DIAGNOSIS — I87332 Chronic venous hypertension (idiopathic) with ulcer and inflammation of left lower extremity: Secondary | ICD-10-CM | POA: Diagnosis not present

## 2017-05-17 DIAGNOSIS — I890 Lymphedema, not elsewhere classified: Secondary | ICD-10-CM | POA: Diagnosis not present

## 2017-05-17 DIAGNOSIS — I110 Hypertensive heart disease with heart failure: Secondary | ICD-10-CM | POA: Diagnosis not present

## 2017-05-17 DIAGNOSIS — Z9884 Bariatric surgery status: Secondary | ICD-10-CM | POA: Diagnosis not present

## 2017-05-17 DIAGNOSIS — I83212 Varicose veins of right lower extremity with both ulcer of calf and inflammation: Secondary | ICD-10-CM | POA: Diagnosis not present

## 2017-05-17 DIAGNOSIS — I509 Heart failure, unspecified: Secondary | ICD-10-CM | POA: Diagnosis not present

## 2017-05-17 DIAGNOSIS — L97212 Non-pressure chronic ulcer of right calf with fat layer exposed: Secondary | ICD-10-CM | POA: Diagnosis not present

## 2017-05-17 DIAGNOSIS — I87313 Chronic venous hypertension (idiopathic) with ulcer of bilateral lower extremity: Secondary | ICD-10-CM | POA: Diagnosis not present

## 2017-05-20 ENCOUNTER — Encounter: Payer: Self-pay | Admitting: Family Medicine

## 2017-05-20 ENCOUNTER — Ambulatory Visit (INDEPENDENT_AMBULATORY_CARE_PROVIDER_SITE_OTHER): Payer: Medicare HMO | Admitting: Family Medicine

## 2017-05-20 VITALS — BP 128/82 | HR 76 | Temp 98.3°F | Ht 64.0 in | Wt 185.6 lb

## 2017-05-20 DIAGNOSIS — D509 Iron deficiency anemia, unspecified: Secondary | ICD-10-CM

## 2017-05-20 DIAGNOSIS — S81809D Unspecified open wound, unspecified lower leg, subsequent encounter: Secondary | ICD-10-CM

## 2017-05-20 DIAGNOSIS — G629 Polyneuropathy, unspecified: Secondary | ICD-10-CM

## 2017-05-20 DIAGNOSIS — D649 Anemia, unspecified: Secondary | ICD-10-CM | POA: Diagnosis not present

## 2017-05-20 DIAGNOSIS — M25561 Pain in right knee: Secondary | ICD-10-CM | POA: Diagnosis not present

## 2017-05-20 DIAGNOSIS — I10 Essential (primary) hypertension: Secondary | ICD-10-CM | POA: Diagnosis not present

## 2017-05-20 DIAGNOSIS — M25562 Pain in left knee: Secondary | ICD-10-CM

## 2017-05-20 DIAGNOSIS — G6289 Other specified polyneuropathies: Secondary | ICD-10-CM | POA: Diagnosis not present

## 2017-05-20 HISTORY — DX: Polyneuropathy, unspecified: G62.9

## 2017-05-20 MED ORDER — OXYCODONE HCL 10 MG PO TABS: 10.0000 mg | ORAL_TABLET | Freq: Four times a day (QID) | ORAL | 0 refills | Status: DC | PRN

## 2017-05-20 MED ORDER — GABAPENTIN 300 MG PO CAPS: 900.0000 mg | ORAL_CAPSULE | Freq: Three times a day (TID) | ORAL | 0 refills | Status: DC

## 2017-05-20 NOTE — Assessment & Plan Note (Signed)
Seems to be mostly secondary to wound.  A1C has been controlled for quite some time.   Increasing Gabapentin to 900 mg TID.  If no improvement, switch to Lyrica.  FU in 1 month to assess for improvement.

## 2017-05-20 NOTE — Assessment & Plan Note (Signed)
Repeating CBC today.  She has not been taking her iron she tells me today.

## 2017-05-20 NOTE — Patient Instructions (Signed)
It was very good to meet you today.  I have refilled your medicines for you.  Come back and see me in about a month or so to see how you're doing.  Rechecking labs today.

## 2017-05-20 NOTE — Assessment & Plan Note (Signed)
As per prior OV notes she has had some continued pain from wound/skin grafts.  Still with notable pain.  Controlled with oxycodone to take at night.  Refills provided today.  She is continuing to follow up with Wound Care.

## 2017-05-20 NOTE — Progress Notes (Signed)
Subjective:    [name] is a 61 y.o. female who presents to [facility] today for FU for leg pain:  1.  Leg pain:  Chronic issue for patient.  See prior OV notes for full details.  She has had multiple skin grafts due to chronic venous insuffiencey wounds and is being followed by Wound Care.  Has developed chronic pain from this, as well as what seems to be neuropathic pain -- she has fairly chronic dull aching combined with sharp stabbing pain plus "burning" mostly at site of her wounds.  No falls.  No drainage.  No surrounding redness.  Takes Gabapentin plus Oxycodone for pain relief.  Has been on oxycodone for past year or so.  States gabapentin not very helpful right now    ROS as above per HPI.    The following portions of the patient's history were reviewed and updated as appropriate: allergies, current medications, past medical history, family and social history, and problem list. Patient is a nonsmoker.    PMH reviewed.  Past Medical History:  Diagnosis Date  . Acute renal failure ([place]) 09/24/2016  . Anemia   . Arthritis    knees  . Back pain 10/04/2012  . Blood in stool 10/07/2014  . Blood transfusion without reported diagnosis   . Bunion 03/28/2014  . Cellulitis of leg, left 09/14/2016  . Chest pain 05/12/2015  . Constipation 10/19/2013   Chronic in the setting of known diverticulosis and history of bleeds.    . Cramping of hands 02/27/2016  . 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 Degenerative arthritis of right shoulder region 08/2013  . Diverticulosis   . Diverticulosis of colon with hemorrhage    Hx  Of recurrent Diverticular bleeding - several prior admissions   . Diverticulosis of colon without hemorrhage 11/18/2014   Noted on colonoscopy 11/2014   . GERD (gastroesophageal reflux disease)   . High cholesterol   . History of GI bleed   . Hypertension    under control with meds., has been on med. > 20 yr.  .  Intractable vomiting 09/24/2016  . Lower GI bleed   . Medial meniscus tear 1997   Right Knee  . Mini stroke ([place])   . Muscle pain 05/26/2015  . Rotator cuff rupture, complete 08/2013   right  . Shoulder impingement 08/2013   right  . Stroke [facility]) 2006   Remote left lacunar infarct noted on CT head 2006, 2010   . Ulcer   . Wears dentures    upper  . Wears partial dentures    lower   Past Surgical History:  Procedure Laterality Date  . ABDOMINAL HYSTERECTOMY  ?1987   partial  . COLONOSCOPY  05/02/2000; 05/08/2013  . COSMETIC SURGERY  02/22/2014   skin removal surgery from lower abdomen   . ENDOVENOUS ABLATION SAPHENOUS VEIN W/ LASER Right 08/12/2016   endovenous laser ablation right greater saphenous vein by Curt Jews MD   . ENDOVENOUS ABLATION SAPHENOUS VEIN W/ LASER Left 10/21/2016   EVLA L GSV by Curt Jews MD  . METATARSAL OSTEOTOMY Right 1995   Right Bunion Repair  . 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 SHOULDER ARTHROSCOPY WITH ROTATOR CUFF REPAIR AND SUBACROMIAL DECOMPRESSION Right 08/09/2013   Procedure: RIGHT SHOULDER ARTHROSCOPY WITH SUBACROMIAL DECOMPRESSION, DISTAL CLAVICLE EXCISION AND ROTATOR CUFF REPAIR and release biceps;  Surgeon: Ninetta Lights, MD;  Location: [place];  Service: Orthopedics;  Laterality: Right;  . SUBTOTAL COLECTOMY  11/24/15   [place] Left   . TOTAL KNEE ARTHROPLASTY Right     Medications reviewed.    Objective:   Physical Exam BP 128/82   Pulse 76   Temp 98.3 F (36.8 C) (Oral)   Ht 5\' 4"  (1.626 m)   Wt 185 lb 9.6 oz (84.2 kg)   SpO2 99%   BMI 31.86 kg/m  Gen:  Alert, cooperative patient who appears stated age in no acute distress.  Vital signs reviewed. HEENT: EOMI,  MMM Cardiac:  Regular rate and rhythm without murmur auscultated.  Good S1/S2. Pulm:  Clear to auscultation bilaterally with good air movement.  No wheezes or rales noted.   Exts: BL LE's with medial wounds  located distal tibia.  Both are clean and dry with no surrounding redness.  Right has more areas of epithelization.  Left still with 5 x 1 cm in length open wound with no purulence or drainage.  TTP only directly over the wound.

## 2017-05-21 LAB — LIPID PANEL
Chol/HDL Ratio: 1.6 ratio (ref 0.0–4.4)
Cholesterol, Total: 151 mg/dL (ref 100–199)
HDL: 94 mg/dL (ref >39)
LDL Calculated: 48 mg/dL (ref 0–99)
Triglycerides: 46 mg/dL (ref 0–149)
VLDL Cholesterol Cal: 9 mg/dL (ref 5–40)

## 2017-05-21 LAB — COMPREHENSIVE METABOLIC PANEL
ALT: 47 IU/L — ABNORMAL HIGH (ref 0–32)
AST: 49 IU/L — ABNORMAL HIGH (ref 0–40)
Albumin/Globulin Ratio: 1.4 (ref 1.2–2.2)
Albumin: 4.1 g/dL (ref 3.6–4.8)
Alkaline Phosphatase: 76 IU/L (ref 39–117)
BUN/Creatinine Ratio: 26 (ref 12–28)
BUN: 20 mg/dL (ref 8–27)
Bilirubin Total: 0.3 mg/dL (ref 0.0–1.2)
CO2: 23 mmol/L (ref 20–29)
Calcium: 9.4 mg/dL (ref 8.7–10.3)
Chloride: 106 mmol/L (ref 96–106)
Creatinine, Ser: 0.77 mg/dL (ref 0.57–1.00)
GFR calc Af Amer: 96 mL/min/{1.73_m2} (ref >59)
GFR calc non Af Amer: 84 mL/min/{1.73_m2} (ref >59)
Globulin, Total: 3 g/dL (ref 1.5–4.5)
Glucose: 79 mg/dL (ref 65–99)
Potassium: 4 mmol/L (ref 3.5–5.2)
Sodium: 145 mmol/L — ABNORMAL HIGH (ref 134–144)
Total Protein: 7.1 g/dL (ref 6.0–8.5)

## 2017-05-21 LAB — CBC
Hematocrit: 30.9 % — ABNORMAL LOW (ref 34.0–46.6)
Hemoglobin: 9.6 g/dL — ABNORMAL LOW (ref 11.1–15.9)
MCH: 26.9 pg (ref 26.6–33.0)
MCHC: 31.1 g/dL — ABNORMAL LOW (ref 31.5–35.7)
MCV: 87 fL (ref 79–97)
Platelets: 256 10*3/uL (ref 150–379)
RBC: 3.57 x10E6/uL — ABNORMAL LOW (ref 3.77–5.28)
RDW: 16.9 % — ABNORMAL HIGH (ref 12.3–15.4)
WBC: 5.3 10*3/uL (ref 3.4–10.8)

## 2017-05-24 DIAGNOSIS — Z9884 Bariatric surgery status: Secondary | ICD-10-CM | POA: Diagnosis not present

## 2017-05-24 DIAGNOSIS — I110 Hypertensive heart disease with heart failure: Secondary | ICD-10-CM | POA: Diagnosis not present

## 2017-05-24 DIAGNOSIS — L97212 Non-pressure chronic ulcer of right calf with fat layer exposed: Secondary | ICD-10-CM | POA: Diagnosis not present

## 2017-05-24 DIAGNOSIS — I509 Heart failure, unspecified: Secondary | ICD-10-CM | POA: Diagnosis not present

## 2017-05-24 DIAGNOSIS — I87313 Chronic venous hypertension (idiopathic) with ulcer of bilateral lower extremity: Secondary | ICD-10-CM | POA: Diagnosis not present

## 2017-05-24 DIAGNOSIS — I87332 Chronic venous hypertension (idiopathic) with ulcer and inflammation of left lower extremity: Secondary | ICD-10-CM | POA: Diagnosis not present

## 2017-05-24 DIAGNOSIS — I83212 Varicose veins of right lower extremity with both ulcer of calf and inflammation: Secondary | ICD-10-CM | POA: Diagnosis not present

## 2017-05-24 DIAGNOSIS — L97222 Non-pressure chronic ulcer of left calf with fat layer exposed: Secondary | ICD-10-CM | POA: Diagnosis not present

## 2017-05-24 DIAGNOSIS — I890 Lymphedema, not elsewhere classified: Secondary | ICD-10-CM | POA: Diagnosis not present

## 2017-05-26 ENCOUNTER — Encounter: Payer: Self-pay | Admitting: Family Medicine

## 2017-05-30 ENCOUNTER — Telehealth: Payer: Self-pay | Admitting: Family Medicine

## 2017-05-30 DIAGNOSIS — H6123 Impacted cerumen, bilateral: Secondary | ICD-10-CM

## 2017-05-30 DIAGNOSIS — R21 Rash and other nonspecific skin eruption: Secondary | ICD-10-CM

## 2017-05-30 NOTE — Telephone Encounter (Signed)
Pt would like referral to [facility].  Pt is wanting her ears cleaned out.  She also needs a refill on the cream she was putting on her legs. Please call in a refill to [facility]

## 2017-05-31 DIAGNOSIS — L97212 Non-pressure chronic ulcer of right calf with fat layer exposed: Secondary | ICD-10-CM | POA: Diagnosis not present

## 2017-05-31 DIAGNOSIS — I890 Lymphedema, not elsewhere classified: Secondary | ICD-10-CM | POA: Diagnosis not present

## 2017-05-31 DIAGNOSIS — I87313 Chronic venous hypertension (idiopathic) with ulcer of bilateral lower extremity: Secondary | ICD-10-CM | POA: Diagnosis not present

## 2017-05-31 DIAGNOSIS — L97222 Non-pressure chronic ulcer of left calf with fat layer exposed: Secondary | ICD-10-CM | POA: Diagnosis not present

## 2017-05-31 DIAGNOSIS — I110 Hypertensive heart disease with heart failure: Secondary | ICD-10-CM | POA: Diagnosis not present

## 2017-05-31 DIAGNOSIS — I87332 Chronic venous hypertension (idiopathic) with ulcer and inflammation of left lower extremity: Secondary | ICD-10-CM | POA: Diagnosis not present

## 2017-05-31 DIAGNOSIS — I83212 Varicose veins of right lower extremity with both ulcer of calf and inflammation: Secondary | ICD-10-CM | POA: Diagnosis not present

## 2017-05-31 DIAGNOSIS — I509 Heart failure, unspecified: Secondary | ICD-10-CM | POA: Diagnosis not present

## 2017-05-31 DIAGNOSIS — Z9884 Bariatric surgery status: Secondary | ICD-10-CM | POA: Diagnosis not present

## 2017-05-31 MED ORDER — TRIAMCINOLONE ACETONIDE 0.5 % EX OINT: 1.0000 "application " | TOPICAL_OINTMENT | Freq: Two times a day (BID) | CUTANEOUS | 0 refills | Status: DC

## 2017-05-31 NOTE — Telephone Encounter (Signed)
I have completed both of these things.  Thanks

## 2017-06-01 ENCOUNTER — Encounter (HOSPITAL_BASED_OUTPATIENT_CLINIC_OR_DEPARTMENT_OTHER): Payer: Medicare HMO | Attending: Surgery

## 2017-06-01 DIAGNOSIS — I87332 Chronic venous hypertension (idiopathic) with ulcer and inflammation of left lower extremity: Secondary | ICD-10-CM | POA: Diagnosis not present

## 2017-06-01 DIAGNOSIS — I83012 Varicose veins of right lower extremity with ulcer of calf: Secondary | ICD-10-CM | POA: Diagnosis not present

## 2017-06-01 DIAGNOSIS — Z9049 Acquired absence of other specified parts of digestive tract: Secondary | ICD-10-CM | POA: Insufficient documentation

## 2017-06-01 DIAGNOSIS — I509 Heart failure, unspecified: Secondary | ICD-10-CM | POA: Insufficient documentation

## 2017-06-01 DIAGNOSIS — Z9884 Bariatric surgery status: Secondary | ICD-10-CM | POA: Diagnosis not present

## 2017-06-01 DIAGNOSIS — I110 Hypertensive heart disease with heart failure: Secondary | ICD-10-CM | POA: Insufficient documentation

## 2017-06-01 DIAGNOSIS — L97222 Non-pressure chronic ulcer of left calf with fat layer exposed: Secondary | ICD-10-CM | POA: Diagnosis not present

## 2017-06-01 DIAGNOSIS — L97212 Non-pressure chronic ulcer of right calf with fat layer exposed: Secondary | ICD-10-CM | POA: Insufficient documentation

## 2017-06-01 DIAGNOSIS — I890 Lymphedema, not elsewhere classified: Secondary | ICD-10-CM | POA: Diagnosis not present

## 2017-06-07 DIAGNOSIS — I890 Lymphedema, not elsewhere classified: Secondary | ICD-10-CM | POA: Diagnosis not present

## 2017-06-07 DIAGNOSIS — I87313 Chronic venous hypertension (idiopathic) with ulcer of bilateral lower extremity: Secondary | ICD-10-CM | POA: Diagnosis not present

## 2017-06-07 DIAGNOSIS — Z9049 Acquired absence of other specified parts of digestive tract: Secondary | ICD-10-CM | POA: Diagnosis not present

## 2017-06-07 DIAGNOSIS — I509 Heart failure, unspecified: Secondary | ICD-10-CM | POA: Diagnosis not present

## 2017-06-07 DIAGNOSIS — L97222 Non-pressure chronic ulcer of left calf with fat layer exposed: Secondary | ICD-10-CM | POA: Diagnosis not present

## 2017-06-07 DIAGNOSIS — I87332 Chronic venous hypertension (idiopathic) with ulcer and inflammation of left lower extremity: Secondary | ICD-10-CM | POA: Diagnosis not present

## 2017-06-07 DIAGNOSIS — L97212 Non-pressure chronic ulcer of right calf with fat layer exposed: Secondary | ICD-10-CM | POA: Diagnosis not present

## 2017-06-07 DIAGNOSIS — Z9884 Bariatric surgery status: Secondary | ICD-10-CM | POA: Diagnosis not present

## 2017-06-07 DIAGNOSIS — I83012 Varicose veins of right lower extremity with ulcer of calf: Secondary | ICD-10-CM | POA: Diagnosis not present

## 2017-06-07 DIAGNOSIS — I110 Hypertensive heart disease with heart failure: Secondary | ICD-10-CM | POA: Diagnosis not present

## 2017-06-14 DIAGNOSIS — I890 Lymphedema, not elsewhere classified: Secondary | ICD-10-CM | POA: Diagnosis not present

## 2017-06-14 DIAGNOSIS — Z9884 Bariatric surgery status: Secondary | ICD-10-CM | POA: Diagnosis not present

## 2017-06-14 DIAGNOSIS — I872 Venous insufficiency (chronic) (peripheral): Secondary | ICD-10-CM | POA: Diagnosis not present

## 2017-06-14 DIAGNOSIS — Z9049 Acquired absence of other specified parts of digestive tract: Secondary | ICD-10-CM | POA: Diagnosis not present

## 2017-06-14 DIAGNOSIS — I509 Heart failure, unspecified: Secondary | ICD-10-CM | POA: Diagnosis not present

## 2017-06-14 DIAGNOSIS — L97212 Non-pressure chronic ulcer of right calf with fat layer exposed: Secondary | ICD-10-CM | POA: Diagnosis not present

## 2017-06-14 DIAGNOSIS — I87332 Chronic venous hypertension (idiopathic) with ulcer and inflammation of left lower extremity: Secondary | ICD-10-CM | POA: Diagnosis not present

## 2017-06-14 DIAGNOSIS — I83012 Varicose veins of right lower extremity with ulcer of calf: Secondary | ICD-10-CM | POA: Diagnosis not present

## 2017-06-14 DIAGNOSIS — I110 Hypertensive heart disease with heart failure: Secondary | ICD-10-CM | POA: Diagnosis not present

## 2017-06-14 DIAGNOSIS — L97222 Non-pressure chronic ulcer of left calf with fat layer exposed: Secondary | ICD-10-CM | POA: Diagnosis not present

## 2017-06-15 DIAGNOSIS — S81801A Unspecified open wound, right lower leg, initial encounter: Secondary | ICD-10-CM | POA: Diagnosis not present

## 2017-06-17 ENCOUNTER — Ambulatory Visit (INDEPENDENT_AMBULATORY_CARE_PROVIDER_SITE_OTHER): Payer: Medicare HMO | Admitting: *Deleted

## 2017-06-17 ENCOUNTER — Telehealth: Payer: Self-pay | Admitting: *Deleted

## 2017-06-17 DIAGNOSIS — Z111 Encounter for screening for respiratory tuberculosis: Secondary | ICD-10-CM | POA: Diagnosis not present

## 2017-06-17 NOTE — Telephone Encounter (Signed)
Patient called requesting last PPD and physical.  Patient's last PPD was 2016. Patient will pickup a copy of last office visit today, PPD scheduled for this afternoon at 2 PM. Patient need to sign release of information for physical.  Derl Barrow, RN

## 2017-06-17 NOTE — Progress Notes (Signed)
Tuberculin skin test applied to left ventral forearm. Explained how to read the test, measuring induration not just erythema; she will come into the office in 48-72 hours to have test read.

## 2017-06-20 ENCOUNTER — Encounter: Payer: Self-pay | Admitting: *Deleted

## 2017-06-20 ENCOUNTER — Ambulatory Visit (INDEPENDENT_AMBULATORY_CARE_PROVIDER_SITE_OTHER): Payer: Medicare HMO | Admitting: *Deleted

## 2017-06-20 DIAGNOSIS — Z111 Encounter for screening for respiratory tuberculosis: Secondary | ICD-10-CM

## 2017-06-20 LAB — TB SKIN TEST
Induration: 0 mm
TB Skin Test: NEGATIVE

## 2017-06-20 NOTE — Progress Notes (Signed)
   PPD Reading Note PPD read and results entered in EpicCare. Result: 0 mm induration. Interpretation: Negative If test not read within 48-72 hours of initial placement, patient advised to repeat in other arm 1-3 weeks after this test. Allergic reaction: no  Martin, Tamika L, RN  

## 2017-06-21 DIAGNOSIS — I83012 Varicose veins of right lower extremity with ulcer of calf: Secondary | ICD-10-CM | POA: Diagnosis not present

## 2017-06-21 DIAGNOSIS — I110 Hypertensive heart disease with heart failure: Secondary | ICD-10-CM | POA: Diagnosis not present

## 2017-06-21 DIAGNOSIS — I87332 Chronic venous hypertension (idiopathic) with ulcer and inflammation of left lower extremity: Secondary | ICD-10-CM | POA: Diagnosis not present

## 2017-06-21 DIAGNOSIS — I87312 Chronic venous hypertension (idiopathic) with ulcer of left lower extremity: Secondary | ICD-10-CM | POA: Diagnosis not present

## 2017-06-21 DIAGNOSIS — L97222 Non-pressure chronic ulcer of left calf with fat layer exposed: Secondary | ICD-10-CM | POA: Diagnosis not present

## 2017-06-21 DIAGNOSIS — Z9884 Bariatric surgery status: Secondary | ICD-10-CM | POA: Diagnosis not present

## 2017-06-21 DIAGNOSIS — I509 Heart failure, unspecified: Secondary | ICD-10-CM | POA: Diagnosis not present

## 2017-06-21 DIAGNOSIS — I890 Lymphedema, not elsewhere classified: Secondary | ICD-10-CM | POA: Diagnosis not present

## 2017-06-21 DIAGNOSIS — L97212 Non-pressure chronic ulcer of right calf with fat layer exposed: Secondary | ICD-10-CM | POA: Diagnosis not present

## 2017-06-21 DIAGNOSIS — Z9049 Acquired absence of other specified parts of digestive tract: Secondary | ICD-10-CM | POA: Diagnosis not present

## 2017-06-28 DIAGNOSIS — I509 Heart failure, unspecified: Secondary | ICD-10-CM | POA: Diagnosis not present

## 2017-06-28 DIAGNOSIS — I87313 Chronic venous hypertension (idiopathic) with ulcer of bilateral lower extremity: Secondary | ICD-10-CM | POA: Diagnosis not present

## 2017-06-28 DIAGNOSIS — I83012 Varicose veins of right lower extremity with ulcer of calf: Secondary | ICD-10-CM | POA: Diagnosis not present

## 2017-06-28 DIAGNOSIS — Z9884 Bariatric surgery status: Secondary | ICD-10-CM | POA: Diagnosis not present

## 2017-06-28 DIAGNOSIS — L97222 Non-pressure chronic ulcer of left calf with fat layer exposed: Secondary | ICD-10-CM | POA: Diagnosis not present

## 2017-06-28 DIAGNOSIS — I110 Hypertensive heart disease with heart failure: Secondary | ICD-10-CM | POA: Diagnosis not present

## 2017-06-28 DIAGNOSIS — L97212 Non-pressure chronic ulcer of right calf with fat layer exposed: Secondary | ICD-10-CM | POA: Diagnosis not present

## 2017-06-28 DIAGNOSIS — I87332 Chronic venous hypertension (idiopathic) with ulcer and inflammation of left lower extremity: Secondary | ICD-10-CM | POA: Diagnosis not present

## 2017-06-28 DIAGNOSIS — I890 Lymphedema, not elsewhere classified: Secondary | ICD-10-CM | POA: Diagnosis not present

## 2017-06-28 DIAGNOSIS — Z9049 Acquired absence of other specified parts of digestive tract: Secondary | ICD-10-CM | POA: Diagnosis not present

## 2017-07-01 ENCOUNTER — Encounter: Payer: Self-pay | Admitting: Family Medicine

## 2017-07-01 ENCOUNTER — Ambulatory Visit (INDEPENDENT_AMBULATORY_CARE_PROVIDER_SITE_OTHER): Payer: Medicare HMO | Admitting: Family Medicine

## 2017-07-01 VITALS — BP 122/84 | HR 80 | Temp 98.2°F | Ht 64.0 in | Wt 187.4 lb

## 2017-07-01 DIAGNOSIS — Z23 Encounter for immunization: Secondary | ICD-10-CM | POA: Diagnosis not present

## 2017-07-01 DIAGNOSIS — D509 Iron deficiency anemia, unspecified: Secondary | ICD-10-CM

## 2017-07-01 DIAGNOSIS — L97322 Non-pressure chronic ulcer of left ankle with fat layer exposed: Secondary | ICD-10-CM | POA: Diagnosis not present

## 2017-07-01 DIAGNOSIS — D649 Anemia, unspecified: Secondary | ICD-10-CM

## 2017-07-01 DIAGNOSIS — M25561 Pain in right knee: Secondary | ICD-10-CM

## 2017-07-01 DIAGNOSIS — G4700 Insomnia, unspecified: Secondary | ICD-10-CM | POA: Diagnosis not present

## 2017-07-01 DIAGNOSIS — M25562 Pain in left knee: Secondary | ICD-10-CM

## 2017-07-01 DIAGNOSIS — I83023 Varicose veins of left lower extremity with ulcer of ankle: Secondary | ICD-10-CM | POA: Diagnosis not present

## 2017-07-01 MED ORDER — SANTYL 250 UNIT/GM EX OINT: TOPICAL_OINTMENT | Freq: Every day | CUTANEOUS | 2 refills | Status: DC

## 2017-07-01 MED ORDER — PANTOPRAZOLE SODIUM 40 MG PO TBEC
40.0000 mg | DELAYED_RELEASE_TABLET | Freq: Every day | ORAL | 1 refills | Status: DC
Start: 2017-07-01 — End: 2018-05-23

## 2017-07-01 MED ORDER — GABAPENTIN 300 MG PO CAPS: 900.0000 mg | ORAL_CAPSULE | Freq: Three times a day (TID) | ORAL | 0 refills | Status: DC

## 2017-07-01 MED ORDER — LISINOPRIL 20 MG PO TABS: 20.0000 mg | ORAL_TABLET | Freq: Every day | ORAL | 1 refills | Status: DC

## 2017-07-01 MED ORDER — ATORVASTATIN CALCIUM 40 MG PO TABS: ORAL_TABLET | ORAL | 1 refills | Status: DC

## 2017-07-01 MED ORDER — ZOLPIDEM TARTRATE 5 MG PO TABS: 5.0000 mg | ORAL_TABLET | Freq: Every evening | ORAL | 1 refills | Status: DC | PRN

## 2017-07-01 NOTE — Progress Notes (Signed)
Subjective:    [name] is a 61 y.o. female who presents to [facility] today for Fu for insomnia and to discuss lab results:  1.  Insomnia: Long-standing problem for patient. She is previously been treated with Ambien 10 mg for "as long as I can remember." She is working on better sleep hygiene. No screens of eyelids before bed. States she has trouble falling asleep. No caffeine or stimulant use.  2. Review labs: Patient with the iron deficiency labs she had last time. She's had no further bleeding she has been aware of. No melena. No hematemesis. No hematochezia. No abdominal pain. Does report feeling "fatigued most the time." No chest pain. She does have shortness of breath if she has to walk for prolonged distance. No cough  3. Chronic lower extremity pain: Much better than previously. She is doing better on the increased dose of gabapentin. Also doing well with occasional oxycodone use. Continuing to follow with wound care.  No falls.   ROS as above per HPI.  Pertinently, no chest pain, palpitations, SOB, Fever, Chills, Abd pain, N/V/D.   The following portions of the patient's history were reviewed and updated as appropriate: allergies, current medications, past medical history, family and social history, and problem list. Patient is a nonsmoker.    PMH reviewed.     Medications reviewed.    Objective:   Physical Exam Ht 5\' 4"  (1.626 m)   Wt 187 lb 6.4 oz (85 kg)   BMI 32.17 kg/m  Gen:  Alert, cooperative patient who appears stated age in no acute distress.  Vital signs reviewed. HEENT: EOMI,  MMM Cardiac:  Regular rate and rhythm Pulm:  Clear to auscultation bilaterally with good air movement.  No wheezes or rales noted.   Exts: +1 edema bilaterally. Chronic wounds are not infected and healing well. Psych: Pleasant and cooperative. Linear and coherent thought processes. Not depressed..  No results found. However, due to the size of the patient record, not all encounters were  searched. Please check Results Review for a complete set of results.

## 2017-07-01 NOTE — Patient Instructions (Signed)
It was very good to see you again today  I have referred you to hematology for your anemia.  They will call you in the upcoming weeks to schedule an appointment.  I think you will fill much better after this.  Let the ENT know about your nose bleeds.    Refill for sleep medicine today.

## 2017-07-05 ENCOUNTER — Encounter (HOSPITAL_BASED_OUTPATIENT_CLINIC_OR_DEPARTMENT_OTHER): Payer: Medicare HMO | Attending: Surgery

## 2017-07-05 DIAGNOSIS — I110 Hypertensive heart disease with heart failure: Secondary | ICD-10-CM | POA: Insufficient documentation

## 2017-07-05 DIAGNOSIS — I87332 Chronic venous hypertension (idiopathic) with ulcer and inflammation of left lower extremity: Secondary | ICD-10-CM | POA: Insufficient documentation

## 2017-07-05 DIAGNOSIS — L97819 Non-pressure chronic ulcer of other part of right lower leg with unspecified severity: Secondary | ICD-10-CM | POA: Diagnosis not present

## 2017-07-05 DIAGNOSIS — I890 Lymphedema, not elsewhere classified: Secondary | ICD-10-CM | POA: Diagnosis not present

## 2017-07-05 DIAGNOSIS — I509 Heart failure, unspecified: Secondary | ICD-10-CM | POA: Insufficient documentation

## 2017-07-05 DIAGNOSIS — I83012 Varicose veins of right lower extremity with ulcer of calf: Secondary | ICD-10-CM | POA: Insufficient documentation

## 2017-07-05 DIAGNOSIS — L97829 Non-pressure chronic ulcer of other part of left lower leg with unspecified severity: Secondary | ICD-10-CM | POA: Insufficient documentation

## 2017-07-05 DIAGNOSIS — I87313 Chronic venous hypertension (idiopathic) with ulcer of bilateral lower extremity: Secondary | ICD-10-CM | POA: Diagnosis not present

## 2017-07-05 DIAGNOSIS — L97212 Non-pressure chronic ulcer of right calf with fat layer exposed: Secondary | ICD-10-CM | POA: Diagnosis not present

## 2017-07-05 DIAGNOSIS — L97222 Non-pressure chronic ulcer of left calf with fat layer exposed: Secondary | ICD-10-CM | POA: Diagnosis not present

## 2017-07-05 NOTE — Assessment & Plan Note (Signed)
Continuing to feel well. She is following with wound care. She is wearing compression stockings. Continue care as she has been.

## 2017-07-05 NOTE — Assessment & Plan Note (Signed)
Last assessment of iron was actually within normal limits. She still has hemoglobin of around 9. She had a normal colonoscopy 2 years ago. Still fatigues much the time. We'll send to hematology for further recommendations for what appears to be non-iron deficient chronic anemia

## 2017-07-05 NOTE — Assessment & Plan Note (Signed)
Stable with when necessary Ambien and continued sleep hygiene. Refill for lower dose of 5 mg rather than the 10 mg Ambien she was taking due to the increased risks of higher dose Ambien

## 2017-07-06 ENCOUNTER — Telehealth: Payer: Self-pay | Admitting: *Deleted

## 2017-07-06 NOTE — Telephone Encounter (Signed)
Patient called stating that she recently received refills on her medications including lisinopril 20mg  which she thought the doctor has taken her off of.  Patient would like clarification before she starts taking this medication.  Will forward to MD to advise. Cadin Luka,CMA

## 2017-07-07 NOTE — Telephone Encounter (Signed)
Patient called back and states that she still hasn't taken this medication because she thinks it wasn't supposed to be refilled.  She plans to return this medication once she hears from her PCP.  She was taken off of it by Dr. Ree Kida. Jazmin Hartsell,CMA

## 2017-07-08 NOTE — Telephone Encounter (Signed)
Called and had to leave message.  I can't actually see that Dr. Ree Kida stopped her lisinopril -- but her blood pressure has been good and it sounds like she's not been taking this.  Plan will be to keep her off this medicine and FU in several months to recheck BP and ensure it's still good.

## 2017-07-12 ENCOUNTER — Encounter (INDEPENDENT_AMBULATORY_CARE_PROVIDER_SITE_OTHER): Payer: Medicare HMO | Admitting: Podiatry

## 2017-07-12 DIAGNOSIS — L97829 Non-pressure chronic ulcer of other part of left lower leg with unspecified severity: Secondary | ICD-10-CM | POA: Diagnosis not present

## 2017-07-12 DIAGNOSIS — I87313 Chronic venous hypertension (idiopathic) with ulcer of bilateral lower extremity: Secondary | ICD-10-CM | POA: Diagnosis not present

## 2017-07-12 DIAGNOSIS — I509 Heart failure, unspecified: Secondary | ICD-10-CM | POA: Diagnosis not present

## 2017-07-12 DIAGNOSIS — L97222 Non-pressure chronic ulcer of left calf with fat layer exposed: Secondary | ICD-10-CM | POA: Diagnosis not present

## 2017-07-12 DIAGNOSIS — I83012 Varicose veins of right lower extremity with ulcer of calf: Secondary | ICD-10-CM | POA: Diagnosis not present

## 2017-07-12 DIAGNOSIS — I890 Lymphedema, not elsewhere classified: Secondary | ICD-10-CM | POA: Diagnosis not present

## 2017-07-12 DIAGNOSIS — I110 Hypertensive heart disease with heart failure: Secondary | ICD-10-CM | POA: Diagnosis not present

## 2017-07-12 DIAGNOSIS — I87332 Chronic venous hypertension (idiopathic) with ulcer and inflammation of left lower extremity: Secondary | ICD-10-CM | POA: Diagnosis not present

## 2017-07-12 DIAGNOSIS — L97819 Non-pressure chronic ulcer of other part of right lower leg with unspecified severity: Secondary | ICD-10-CM | POA: Diagnosis not present

## 2017-07-12 DIAGNOSIS — L97212 Non-pressure chronic ulcer of right calf with fat layer exposed: Secondary | ICD-10-CM | POA: Diagnosis not present

## 2017-07-12 NOTE — Progress Notes (Signed)
This encounter was created in error - please disregard.

## 2017-07-19 DIAGNOSIS — I83012 Varicose veins of right lower extremity with ulcer of calf: Secondary | ICD-10-CM | POA: Diagnosis not present

## 2017-07-19 DIAGNOSIS — I87332 Chronic venous hypertension (idiopathic) with ulcer and inflammation of left lower extremity: Secondary | ICD-10-CM | POA: Diagnosis not present

## 2017-07-19 DIAGNOSIS — L97212 Non-pressure chronic ulcer of right calf with fat layer exposed: Secondary | ICD-10-CM | POA: Diagnosis not present

## 2017-07-19 DIAGNOSIS — L97829 Non-pressure chronic ulcer of other part of left lower leg with unspecified severity: Secondary | ICD-10-CM | POA: Diagnosis not present

## 2017-07-19 DIAGNOSIS — L97222 Non-pressure chronic ulcer of left calf with fat layer exposed: Secondary | ICD-10-CM | POA: Diagnosis not present

## 2017-07-19 DIAGNOSIS — L97819 Non-pressure chronic ulcer of other part of right lower leg with unspecified severity: Secondary | ICD-10-CM | POA: Diagnosis not present

## 2017-07-19 DIAGNOSIS — I110 Hypertensive heart disease with heart failure: Secondary | ICD-10-CM | POA: Diagnosis not present

## 2017-07-19 DIAGNOSIS — I87313 Chronic venous hypertension (idiopathic) with ulcer of bilateral lower extremity: Secondary | ICD-10-CM | POA: Diagnosis not present

## 2017-07-19 DIAGNOSIS — I509 Heart failure, unspecified: Secondary | ICD-10-CM | POA: Diagnosis not present

## 2017-07-19 DIAGNOSIS — I890 Lymphedema, not elsewhere classified: Secondary | ICD-10-CM | POA: Diagnosis not present

## 2017-07-26 DIAGNOSIS — L97819 Non-pressure chronic ulcer of other part of right lower leg with unspecified severity: Secondary | ICD-10-CM | POA: Diagnosis not present

## 2017-07-26 DIAGNOSIS — I83012 Varicose veins of right lower extremity with ulcer of calf: Secondary | ICD-10-CM | POA: Diagnosis not present

## 2017-07-26 DIAGNOSIS — I509 Heart failure, unspecified: Secondary | ICD-10-CM | POA: Diagnosis not present

## 2017-07-26 DIAGNOSIS — L97212 Non-pressure chronic ulcer of right calf with fat layer exposed: Secondary | ICD-10-CM | POA: Diagnosis not present

## 2017-07-26 DIAGNOSIS — L97229 Non-pressure chronic ulcer of left calf with unspecified severity: Secondary | ICD-10-CM | POA: Diagnosis not present

## 2017-07-26 DIAGNOSIS — I87313 Chronic venous hypertension (idiopathic) with ulcer of bilateral lower extremity: Secondary | ICD-10-CM | POA: Diagnosis not present

## 2017-07-26 DIAGNOSIS — I110 Hypertensive heart disease with heart failure: Secondary | ICD-10-CM | POA: Diagnosis not present

## 2017-07-26 DIAGNOSIS — I890 Lymphedema, not elsewhere classified: Secondary | ICD-10-CM | POA: Diagnosis not present

## 2017-07-26 DIAGNOSIS — L97829 Non-pressure chronic ulcer of other part of left lower leg with unspecified severity: Secondary | ICD-10-CM | POA: Diagnosis not present

## 2017-07-26 DIAGNOSIS — I87332 Chronic venous hypertension (idiopathic) with ulcer and inflammation of left lower extremity: Secondary | ICD-10-CM | POA: Diagnosis not present

## 2017-08-02 ENCOUNTER — Encounter (HOSPITAL_BASED_OUTPATIENT_CLINIC_OR_DEPARTMENT_OTHER): Payer: Medicare HMO | Attending: Surgery

## 2017-08-02 DIAGNOSIS — L97822 Non-pressure chronic ulcer of other part of left lower leg with fat layer exposed: Secondary | ICD-10-CM | POA: Insufficient documentation

## 2017-08-02 DIAGNOSIS — Z9049 Acquired absence of other specified parts of digestive tract: Secondary | ICD-10-CM | POA: Diagnosis not present

## 2017-08-02 DIAGNOSIS — I890 Lymphedema, not elsewhere classified: Secondary | ICD-10-CM | POA: Insufficient documentation

## 2017-08-02 DIAGNOSIS — I509 Heart failure, unspecified: Secondary | ICD-10-CM | POA: Insufficient documentation

## 2017-08-02 DIAGNOSIS — I110 Hypertensive heart disease with heart failure: Secondary | ICD-10-CM | POA: Diagnosis not present

## 2017-08-02 DIAGNOSIS — I87331 Chronic venous hypertension (idiopathic) with ulcer and inflammation of right lower extremity: Secondary | ICD-10-CM | POA: Diagnosis not present

## 2017-08-02 DIAGNOSIS — L97812 Non-pressure chronic ulcer of other part of right lower leg with fat layer exposed: Secondary | ICD-10-CM | POA: Insufficient documentation

## 2017-08-02 DIAGNOSIS — L97212 Non-pressure chronic ulcer of right calf with fat layer exposed: Secondary | ICD-10-CM | POA: Diagnosis not present

## 2017-08-02 DIAGNOSIS — I87333 Chronic venous hypertension (idiopathic) with ulcer and inflammation of bilateral lower extremity: Secondary | ICD-10-CM | POA: Diagnosis not present

## 2017-08-02 DIAGNOSIS — L97222 Non-pressure chronic ulcer of left calf with fat layer exposed: Secondary | ICD-10-CM | POA: Diagnosis not present

## 2017-08-02 DIAGNOSIS — I87313 Chronic venous hypertension (idiopathic) with ulcer of bilateral lower extremity: Secondary | ICD-10-CM | POA: Diagnosis not present

## 2017-08-02 DIAGNOSIS — Z9884 Bariatric surgery status: Secondary | ICD-10-CM | POA: Diagnosis not present

## 2017-08-09 DIAGNOSIS — I87313 Chronic venous hypertension (idiopathic) with ulcer of bilateral lower extremity: Secondary | ICD-10-CM | POA: Diagnosis not present

## 2017-08-09 DIAGNOSIS — L97812 Non-pressure chronic ulcer of other part of right lower leg with fat layer exposed: Secondary | ICD-10-CM | POA: Diagnosis not present

## 2017-08-09 DIAGNOSIS — I87333 Chronic venous hypertension (idiopathic) with ulcer and inflammation of bilateral lower extremity: Secondary | ICD-10-CM | POA: Diagnosis not present

## 2017-08-09 DIAGNOSIS — L97222 Non-pressure chronic ulcer of left calf with fat layer exposed: Secondary | ICD-10-CM | POA: Diagnosis not present

## 2017-08-09 DIAGNOSIS — L97822 Non-pressure chronic ulcer of other part of left lower leg with fat layer exposed: Secondary | ICD-10-CM | POA: Diagnosis not present

## 2017-08-09 DIAGNOSIS — I890 Lymphedema, not elsewhere classified: Secondary | ICD-10-CM | POA: Diagnosis not present

## 2017-08-09 DIAGNOSIS — Z9884 Bariatric surgery status: Secondary | ICD-10-CM | POA: Diagnosis not present

## 2017-08-09 DIAGNOSIS — I509 Heart failure, unspecified: Secondary | ICD-10-CM | POA: Diagnosis not present

## 2017-08-09 DIAGNOSIS — L97212 Non-pressure chronic ulcer of right calf with fat layer exposed: Secondary | ICD-10-CM | POA: Diagnosis not present

## 2017-08-09 DIAGNOSIS — I87331 Chronic venous hypertension (idiopathic) with ulcer and inflammation of right lower extremity: Secondary | ICD-10-CM | POA: Diagnosis not present

## 2017-08-09 DIAGNOSIS — I110 Hypertensive heart disease with heart failure: Secondary | ICD-10-CM | POA: Diagnosis not present

## 2017-08-09 DIAGNOSIS — Z9049 Acquired absence of other specified parts of digestive tract: Secondary | ICD-10-CM | POA: Diagnosis not present

## 2017-08-12 ENCOUNTER — Telehealth: Payer: Self-pay | Admitting: *Deleted

## 2017-08-12 NOTE — Telephone Encounter (Signed)
Pharmacy calling because they received a Rx via fax for oxycodone 10mg .  Unable to accept fax - need to provide paper copy or med can be sent via eRx.  Will route request to PCP.  Burna Forts, BSN, RN-BC

## 2017-08-14 NOTE — Telephone Encounter (Signed)
I am out of town.  Chrisandra Netters is covering my box starting on Tuesday.  Please have her or the preceptor on Monday print these out.  Thanks!

## 2017-08-15 ENCOUNTER — Other Ambulatory Visit: Payer: Self-pay | Admitting: Family Medicine

## 2017-08-16 ENCOUNTER — Other Ambulatory Visit: Payer: Self-pay | Admitting: Family Medicine

## 2017-08-16 DIAGNOSIS — I87331 Chronic venous hypertension (idiopathic) with ulcer and inflammation of right lower extremity: Secondary | ICD-10-CM | POA: Diagnosis not present

## 2017-08-16 DIAGNOSIS — L97212 Non-pressure chronic ulcer of right calf with fat layer exposed: Secondary | ICD-10-CM | POA: Diagnosis not present

## 2017-08-16 DIAGNOSIS — I110 Hypertensive heart disease with heart failure: Secondary | ICD-10-CM | POA: Diagnosis not present

## 2017-08-16 DIAGNOSIS — I87333 Chronic venous hypertension (idiopathic) with ulcer and inflammation of bilateral lower extremity: Secondary | ICD-10-CM | POA: Diagnosis not present

## 2017-08-16 DIAGNOSIS — Z9049 Acquired absence of other specified parts of digestive tract: Secondary | ICD-10-CM | POA: Diagnosis not present

## 2017-08-16 DIAGNOSIS — L97822 Non-pressure chronic ulcer of other part of left lower leg with fat layer exposed: Secondary | ICD-10-CM | POA: Diagnosis not present

## 2017-08-16 DIAGNOSIS — L97812 Non-pressure chronic ulcer of other part of right lower leg with fat layer exposed: Secondary | ICD-10-CM | POA: Diagnosis not present

## 2017-08-16 DIAGNOSIS — L97222 Non-pressure chronic ulcer of left calf with fat layer exposed: Secondary | ICD-10-CM | POA: Diagnosis not present

## 2017-08-16 DIAGNOSIS — I890 Lymphedema, not elsewhere classified: Secondary | ICD-10-CM | POA: Diagnosis not present

## 2017-08-16 DIAGNOSIS — I509 Heart failure, unspecified: Secondary | ICD-10-CM | POA: Diagnosis not present

## 2017-08-16 DIAGNOSIS — Z9884 Bariatric surgery status: Secondary | ICD-10-CM | POA: Diagnosis not present

## 2017-08-16 MED ORDER — OXYCODONE HCL 10 MG PO TABS: 10.0000 mg | ORAL_TABLET | Freq: Four times a day (QID) | ORAL | 0 refills | Status: DC | PRN

## 2017-08-16 NOTE — Progress Notes (Signed)
Rx written as Dr. Mingo Amber is out of town Dorcas Mcmurray

## 2017-08-16 NOTE — Telephone Encounter (Signed)
THis was taken care of today by Dr. Nori Riis documented in an orders only encounter.  Rx placed up front and [place] notified pt . Katharina Caper, April D, [place]

## 2017-08-23 ENCOUNTER — Telehealth: Payer: Self-pay | Admitting: Hematology and Oncology

## 2017-08-23 ENCOUNTER — Encounter: Payer: Self-pay | Admitting: Hematology and Oncology

## 2017-08-23 DIAGNOSIS — L97222 Non-pressure chronic ulcer of left calf with fat layer exposed: Secondary | ICD-10-CM | POA: Diagnosis not present

## 2017-08-23 DIAGNOSIS — I87333 Chronic venous hypertension (idiopathic) with ulcer and inflammation of bilateral lower extremity: Secondary | ICD-10-CM | POA: Diagnosis not present

## 2017-08-23 DIAGNOSIS — L97822 Non-pressure chronic ulcer of other part of left lower leg with fat layer exposed: Secondary | ICD-10-CM | POA: Diagnosis not present

## 2017-08-23 DIAGNOSIS — Z9049 Acquired absence of other specified parts of digestive tract: Secondary | ICD-10-CM | POA: Diagnosis not present

## 2017-08-23 DIAGNOSIS — I110 Hypertensive heart disease with heart failure: Secondary | ICD-10-CM | POA: Diagnosis not present

## 2017-08-23 DIAGNOSIS — Z9884 Bariatric surgery status: Secondary | ICD-10-CM | POA: Diagnosis not present

## 2017-08-23 DIAGNOSIS — I890 Lymphedema, not elsewhere classified: Secondary | ICD-10-CM | POA: Diagnosis not present

## 2017-08-23 DIAGNOSIS — I509 Heart failure, unspecified: Secondary | ICD-10-CM | POA: Diagnosis not present

## 2017-08-23 DIAGNOSIS — I87311 Chronic venous hypertension (idiopathic) with ulcer of right lower extremity: Secondary | ICD-10-CM | POA: Diagnosis not present

## 2017-08-23 DIAGNOSIS — I87331 Chronic venous hypertension (idiopathic) with ulcer and inflammation of right lower extremity: Secondary | ICD-10-CM | POA: Diagnosis not present

## 2017-08-23 DIAGNOSIS — L97812 Non-pressure chronic ulcer of other part of right lower leg with fat layer exposed: Secondary | ICD-10-CM | POA: Diagnosis not present

## 2017-08-23 NOTE — Telephone Encounter (Signed)
Appt has been scheduled for the pt to see Dr. Lebron Conners on 11/5 at 1pm. Letter mailed.

## 2017-08-26 ENCOUNTER — Ambulatory Visit: Payer: Medicare HMO | Admitting: Family Medicine

## 2017-08-30 DIAGNOSIS — L97222 Non-pressure chronic ulcer of left calf with fat layer exposed: Secondary | ICD-10-CM | POA: Diagnosis not present

## 2017-08-30 DIAGNOSIS — I110 Hypertensive heart disease with heart failure: Secondary | ICD-10-CM | POA: Diagnosis not present

## 2017-08-30 DIAGNOSIS — I87312 Chronic venous hypertension (idiopathic) with ulcer of left lower extremity: Secondary | ICD-10-CM | POA: Diagnosis not present

## 2017-08-30 DIAGNOSIS — Z9049 Acquired absence of other specified parts of digestive tract: Secondary | ICD-10-CM | POA: Diagnosis not present

## 2017-08-30 DIAGNOSIS — Z9884 Bariatric surgery status: Secondary | ICD-10-CM | POA: Diagnosis not present

## 2017-08-30 DIAGNOSIS — I509 Heart failure, unspecified: Secondary | ICD-10-CM | POA: Diagnosis not present

## 2017-08-30 DIAGNOSIS — I890 Lymphedema, not elsewhere classified: Secondary | ICD-10-CM | POA: Diagnosis not present

## 2017-08-30 DIAGNOSIS — L97822 Non-pressure chronic ulcer of other part of left lower leg with fat layer exposed: Secondary | ICD-10-CM | POA: Diagnosis not present

## 2017-08-30 DIAGNOSIS — L97812 Non-pressure chronic ulcer of other part of right lower leg with fat layer exposed: Secondary | ICD-10-CM | POA: Diagnosis not present

## 2017-08-30 DIAGNOSIS — I87331 Chronic venous hypertension (idiopathic) with ulcer and inflammation of right lower extremity: Secondary | ICD-10-CM | POA: Diagnosis not present

## 2017-08-30 DIAGNOSIS — I87333 Chronic venous hypertension (idiopathic) with ulcer and inflammation of bilateral lower extremity: Secondary | ICD-10-CM | POA: Diagnosis not present

## 2017-09-02 ENCOUNTER — Encounter: Payer: Self-pay | Admitting: Student

## 2017-09-02 ENCOUNTER — Ambulatory Visit (INDEPENDENT_AMBULATORY_CARE_PROVIDER_SITE_OTHER): Payer: Medicare HMO | Admitting: Student

## 2017-09-02 VITALS — BP 112/70 | HR 83 | Temp 98.1°F | Ht 64.0 in | Wt 191.0 lb

## 2017-09-02 DIAGNOSIS — I872 Venous insufficiency (chronic) (peripheral): Secondary | ICD-10-CM | POA: Diagnosis not present

## 2017-09-02 DIAGNOSIS — M79602 Pain in left arm: Secondary | ICD-10-CM | POA: Diagnosis not present

## 2017-09-02 MED ORDER — TRAMADOL HCL 50 MG PO TABS: 50.0000 mg | ORAL_TABLET | Freq: Three times a day (TID) | ORAL | 0 refills | Status: DC | PRN

## 2017-09-02 MED ORDER — FUROSEMIDE 40 MG PO TABS: 40.0000 mg | ORAL_TABLET | Freq: Every day | ORAL | 0 refills | Status: DC

## 2017-09-02 NOTE — Patient Instructions (Addendum)
It was great seeing you today! We have addressed the following issues today  1. Left arm pain: this is likely due to some inflammation of the tendons in your shoulder.  I suggest trying shoulder range of motion exercise as we discussed in clinic.  We also gave you a prescription for tramadol.  Continue taking his gabapentin as well. Please call the number we gave you to follow-up with sports medicine clinic. 2. For leg swelling: We have refilled your fluid pill today.  If we did any lab work today, and the results require attention, either me or my nurse will get in touch with you. If everything is normal, you will get a letter in mail and a message via . If you don't hear from [place] in two weeks, please give [place] a call. Otherwise, we look forward to seeing you again at your next visit. If you have any questions or concerns before then, please call the clinic at [phone].  Please bring all your medications to every doctors visit  Sign up for My Chart to have easy access to your labs results, and communication with your Primary care physician.    Please check-out at the front desk before leaving the clinic.    Take Care,   Dr. Cyndia Skeeters    Shoulder Range of Motion Exercises Shoulder range of motion (ROM) exercises are designed to keep the shoulder moving freely. They are often recommended for people who have shoulder pain. Phase 1 exercises When you are able, do this exercise 5-6 days per week, or as told by your health care provider. Work toward doing 2 sets of 10 swings. Pendulum Exercise How To Do This Exercise Lying Down 1. Lie face-down on a bed with your abdomen close to the side of the bed. 2. Let your arm hang over the side of the bed. 3. Relax your shoulder, arm, and hand. 4. Slowly and gently swing your arm forward and back. Do not use your neck muscles to swing your arm. They should be relaxed. If you are struggling to swing your arm, have someone gently swing it for you. When  you do this exercise for the first time, swing your arm at a 15 degree angle for 15 seconds, or swing your arm 10 times. As pain lessens over time, increase the angle of the swing to 30-45 degrees. 5. Repeat steps 1-4 with the other arm.  How To Do This Exercise While Standing 1. Stand next to a sturdy chair or table and hold on to it with your hand. 1. Bend forward at the waist. 2. Bend your knees slightly. 3. Relax your other arm and let it hang limp. 4. Relax the shoulder blade of the arm that is hanging and let it drop. 5. While keeping your shoulder relaxed, use body motion to swing your arm in small circles. The first time you do this exercise, swing your arm for about 30 seconds or 10 times. When you do it next time, swing your arm for a little longer. 6. Stand up tall and relax. 7. Repeat steps 1-7, this time changing the direction of the circles.  This information is not intended to replace advice given to you by your health care provider. Make sure you discuss any questions you have with your health care provider. Document Released: 07/17/2003 Document Revised: 06/13/2016 Document Reviewed: 10/14/2014 Elsevier Interactive Patient Education  [employer].

## 2017-09-02 NOTE — Progress Notes (Signed)
Subjective:    [name] is a 61 y.o. old female here left arm numbness and tingling  HPI Numbness and tingling in left arm: this has been going on for one week. Never had this before. No inciting factor. Woke her up from sleep when it started. No unusual activity such as lifting weight (5 lbs) over head. However, she has been doing this for over 6 months. She is right handed. She has shoulder surgery 4 years ago but doesn't remember which side.  She reports radiation to all of her fingers. Numbness is global around her left arm. Tried heating pad and some topical cream that helped a little bit. She also reports some weakness in left arm, as well as pain in her left shoulder. Pain is worse at night when she sleeps on that side. Denies fever & unintentional weight loss.  She also started having numbness over the dorsal aspect of her first right finger web that has improved.   PMH/Problem List: has OBESITY, NOS; Iron deficiency anemia; HTN, goal below 140/90; Venous (peripheral) insufficiency; Allergic rhinitis; CYST, KIDNEY, ACQUIRED; CEREBROVASCULAR ACCIDENT, HX OF; Hypopigmented skin lesion; Asthma, cough variant; GERD (gastroesophageal reflux disease); Insomnia; External hemorrhoids without complication; Subacromial bursitis; Eczema; Biliary sludge; Premature supraventricular beats; Superficial thrombophlebitis; Hypoalbuminemia; Osteoarthritis of both knees; Cervical pain (neck); Lumbar back pain; DDD (degenerative disc disease), lumbar; Skin lesion of right lower extremity; Healthcare maintenance; Venous stasis ulcer of left ankle with fat layer exposed with varicose veins ([place]); Multiple open wounds of lower leg; Esophageal dysmotility; Rash and nonspecific skin eruption; Pain in joint, lower leg; and Peripheral neuropathy on her problem list.   has a past medical history of Acute renal failure ([place]) (09/24/2016); Anemia; Arthritis; Back pain (10/04/2012); Blood in stool (10/07/2014); Blood transfusion  without reported diagnosis; Bunion (03/28/2014); Cellulitis of leg, left (09/14/2016); Chest pain (05/12/2015); Constipation (10/19/2013); Cramping of hands (02/27/2016); DE QUERVAIN'S TENOSYNOVITIS (11/30/2010); Degenerative arthritis of right shoulder region (08/2013); Diverticulosis; Diverticulosis of colon with hemorrhage; Diverticulosis of colon without hemorrhage (11/18/2014); GERD (gastroesophageal reflux disease); High cholesterol; History of GI bleed; Hypertension; Intractable vomiting (09/24/2016); Lower GI bleed; Medial meniscus tear (1997); Mini stroke ([place]); Muscle pain (05/26/2015); Rotator cuff rupture, complete (08/2013); Shoulder impingement (08/2013); Stroke [facility]) (2006); Ulcer; Wears dentures; and Wears partial dentures.  FH:  Family History  Problem Relation Age of Onset  . Cancer Father 71       Died from complications of colon CA  . Colon cancer Father        died in his 55s  . Diabetes Mother   . Hypertension Mother   . Diabetes Sister   . Heart disease Sister   . Diabetes Sister   . Stroke Sister   . Diabetes Brother   . Stroke Brother   . Diabetes Brother   . Diabetes Brother   . Diabetes Brother   . Hypertension Son     [facility] Social History  Substance Use Topics  . Smoking status: Former Smoker    Packs/day: 0.10    Years: 6.00    Quit date: 32  . Smokeless tobacco: Never Used  . Alcohol use 0.0 oz/week     Comment: occasional wine    Review of Systems Review of systems negative except for pertinent positives and negatives in history of present illness above.     Objective:     Vitals:   09/02/17 1009  BP: 112/70  Pulse: 83  Temp: 98.1 F (36.7 C)  TempSrc: Oral  SpO2:  99%  Weight: 191 lb (86.6 kg)  Height: 5\' 4"  (1.626 m)   Body mass index is 32.79 kg/m.  Physical Exam GEN: appears well, no apparent distress. CVS: RRR, nl S1&S2 RESP: no IWOB MSK:  Shoulder:  Inspection reveals no abnormalities, atrophy or asymmetry.  Palpation is  normal with no tenderness over AC joint or bicipital groove or trapezius muscles.  Mildly limited flexion, abduction & external rotation in her left shoulder on passive range of motion. Internal rotation normal.   Rotator cuff strength elicits pain with external rotation  Positive Hawkin's tests.   Negative Spurling test  Neuro: C5-T1 intact  CVS: brachialis, radial and ulnar pulses intact.   SKIN: no apparent skin lesion NEURO: alert and oiented appropriately, no gross deficits  PSYCH: euthymic mood with congruent affect    Assessment and Plan:  1. Left arm pain: Pain likely rotator cuff tendinopathy.  Pain with external rotation.  She also have positive Hawkins.  However, she has no focal tenderness to palpation.  Osteoarthritis is another possibility with her slightly limited range of motion.  Numbness and tingling are not dermatomal which argues against radiculopathy. Neuro exam within normal limits.  Spurling test negative.  Not sure if she has been taking the gabapentin.  No red flags for infectious etiology or malignancy.  -Discussed about home exercise including wall walking and pendulum exercise -TraMADol (ULTRAM) 50 MG tablet; Take 1 tablet (50 mg total) by mouth every 8 (eight) hours as needed.  Dispense: 30 tablet; Refill: 0 -Recommended taking the gabapentin -Gave phone number to sports medicine clinic  2. Venous (peripheral) insufficiency: Refilled her Lasix today.  Return if symptoms worsen or fail to improve.  Mercy Riding, MD 09/02/17 Pager: [phone]

## 2017-09-05 ENCOUNTER — Encounter: Payer: Medicare HMO | Admitting: Hematology and Oncology

## 2017-09-06 ENCOUNTER — Encounter (HOSPITAL_BASED_OUTPATIENT_CLINIC_OR_DEPARTMENT_OTHER): Payer: Medicare HMO | Attending: Surgery

## 2017-09-06 DIAGNOSIS — I890 Lymphedema, not elsewhere classified: Secondary | ICD-10-CM | POA: Insufficient documentation

## 2017-09-06 DIAGNOSIS — I87312 Chronic venous hypertension (idiopathic) with ulcer of left lower extremity: Secondary | ICD-10-CM | POA: Diagnosis not present

## 2017-09-06 DIAGNOSIS — Z872 Personal history of diseases of the skin and subcutaneous tissue: Secondary | ICD-10-CM | POA: Diagnosis not present

## 2017-09-06 DIAGNOSIS — Z09 Encounter for follow-up examination after completed treatment for conditions other than malignant neoplasm: Secondary | ICD-10-CM | POA: Insufficient documentation

## 2017-09-06 DIAGNOSIS — L97222 Non-pressure chronic ulcer of left calf with fat layer exposed: Secondary | ICD-10-CM | POA: Diagnosis not present

## 2017-09-09 ENCOUNTER — Ambulatory Visit: Payer: Medicare HMO | Admitting: Family Medicine

## 2017-09-21 ENCOUNTER — Other Ambulatory Visit: Payer: Self-pay | Admitting: Family Medicine

## 2017-10-08 ENCOUNTER — Emergency Department (HOSPITAL_COMMUNITY)
Admission: EM | Admit: 2017-10-08 | Discharge: 2017-10-08 | Disposition: A | Discharge status: Home / Self Care | Payer: Medicare HMO | Attending: Emergency Medicine | Admitting: Emergency Medicine

## 2017-10-08 ENCOUNTER — Encounter (HOSPITAL_COMMUNITY): Payer: Self-pay

## 2017-10-08 ENCOUNTER — Other Ambulatory Visit: Payer: Self-pay

## 2017-10-08 ENCOUNTER — Emergency Department (HOSPITAL_COMMUNITY): Payer: Medicare HMO

## 2017-10-08 DIAGNOSIS — M79645 Pain in left finger(s): Secondary | ICD-10-CM | POA: Diagnosis not present

## 2017-10-08 DIAGNOSIS — W010XXA Fall on same level from slipping, tripping and stumbling without subsequent striking against object, initial encounter: Secondary | ICD-10-CM | POA: Diagnosis not present

## 2017-10-08 DIAGNOSIS — M25619 Stiffness of unspecified shoulder, not elsewhere classified: Secondary | ICD-10-CM

## 2017-10-08 DIAGNOSIS — M25512 Pain in left shoulder: Secondary | ICD-10-CM | POA: Diagnosis not present

## 2017-10-08 DIAGNOSIS — Y939 Activity, unspecified: Secondary | ICD-10-CM | POA: Diagnosis not present

## 2017-10-08 DIAGNOSIS — S6992XA Unspecified injury of left wrist, hand and finger(s), initial encounter: Secondary | ICD-10-CM | POA: Diagnosis not present

## 2017-10-08 DIAGNOSIS — Y999 Unspecified external cause status: Secondary | ICD-10-CM | POA: Diagnosis not present

## 2017-10-08 DIAGNOSIS — Y929 Unspecified place or not applicable: Secondary | ICD-10-CM | POA: Diagnosis not present

## 2017-10-08 DIAGNOSIS — S4992XA Unspecified injury of left shoulder and upper arm, initial encounter: Secondary | ICD-10-CM | POA: Diagnosis not present

## 2017-10-08 DIAGNOSIS — W19XXXA Unspecified fall, initial encounter: Secondary | ICD-10-CM

## 2017-10-08 MED ORDER — ACETAMINOPHEN 500 MG PO TABS
1000.0000 mg | ORAL_TABLET | Freq: Once | ORAL | Status: AC
  Administered 2017-10-08: 1000 mg via ORAL
  Filled 2017-10-08: qty 2

## 2017-10-08 NOTE — ED Triage Notes (Signed)
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 Pt tripped over a speaker, No dizziness. Pt has pain from left shoulder down to left finger tips. Has full rom of shoulder and elbow but hard to make a fish. VSS.

## 2017-10-08 NOTE — ED Provider Notes (Signed)
[facility] EMERGENCY DEPARTMENT Provider Note   CSN: [id number] Arrival date & time: 10/08/17  1501     History   Chief Complaint Chief Complaint  Patient presents with  . Fall  . Arm Pain    HPI [name] is a 61 y.o. female presents to the ED for evaluation of acute, constant, throbbing pain to left upper extremity after mechanical fall. States she tripped over a speaker on the floor and fell forward landing on her left upper extremity mostly on her hand. Pain is localized to the left pinky finger and left shoulder associated with decreased range of motion due to pain. No associated numbness or tingling distally. No previous injury or surgeries to this extremity. Denies head trauma, LOC, anti-coagulations, headache, visual changes, nausea, vomiting, swelling, bleeding. No interventions PTA. Aggravating factors include palpation and range of motion.  HPI  Past Medical History:  Diagnosis Date  . Acute renal failure ([place]) 09/24/2016  . Anemia   . Arthritis    knees  . Back pain 10/04/2012  . Blood in stool 10/07/2014  . Blood transfusion without reported diagnosis   . Bunion 03/28/2014  . Cellulitis of leg, left 09/14/2016  . Chest pain 05/12/2015  . Constipation 10/19/2013   Chronic in the setting of known diverticulosis and history of bleeds.    . Cramping of hands 02/27/2016  . 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 Degenerative arthritis of right shoulder region 08/2013  . Diverticulosis   . Diverticulosis of colon with hemorrhage    Hx  Of recurrent Diverticular bleeding - several prior admissions   . Diverticulosis of colon without hemorrhage 11/18/2014   Noted on colonoscopy 11/2014   . GERD (gastroesophageal reflux disease)   . High cholesterol   . History of GI bleed   . Hypertension    under control with meds., has been on med. > 20 yr.  . Intractable vomiting 09/24/2016  . Lower GI  bleed   . Medial meniscus tear 1997   Right Knee  . Mini stroke ([place])   . Muscle pain 05/26/2015  . Rotator cuff rupture, complete 08/2013   right  . Shoulder impingement 08/2013   right  . Stroke [facility]) 2006   Remote left lacunar infarct noted on CT head 2006, 2010   . Ulcer   . Wears dentures    upper  . Wears partial dentures    lower    Patient Active Problem List   Diagnosis Date Noted  . Peripheral neuropathy 05/20/2017  . Pain in joint, lower leg 04/19/2017  . Rash and nonspecific skin eruption 04/01/2017  . Esophageal dysmotility 02/14/2017  . Multiple open wounds of lower leg 11/12/2016  . Venous stasis ulcer of left ankle with fat layer exposed with varicose veins ([place]) 10/20/2016  . Healthcare maintenance 08/05/2016  . Skin lesion of right lower extremity 05/27/2016  . DDD (degenerative disc disease), lumbar 04/14/2016  . Cervical pain (neck) 03/26/2016  . Lumbar back pain 03/26/2016  . Osteoarthritis of both knees 03/12/2016  . Hypoalbuminemia 01/30/2016  . Superficial thrombophlebitis 01/29/2016  . Premature supraventricular beats 12/25/2015  . Biliary sludge 04/08/2014  . Subacromial bursitis 05/21/2013  . Eczema 05/21/2013  . External hemorrhoids without complication 08/17/5101  . Insomnia 08/29/2012  . GERD (gastroesophageal reflux disease) 08/24/2012  . Asthma, cough variant 03/10/2012  . Hypopigmented skin lesion 02/23/2011  . Allergic rhinitis 05/27/2010  .  CYST, KIDNEY, ACQUIRED 08/15/2007  . CEREBROVASCULAR ACCIDENT, HX OF 07/26/2007  . HTN, goal below 140/90 05/12/2007  . OBESITY, NOS 12/29/2006  . Iron deficiency anemia 12/29/2006  . Venous (peripheral) insufficiency 12/29/2006    Past Surgical History:  Procedure Laterality Date  . ABDOMINAL HYSTERECTOMY  ?1987   partial  . COLONOSCOPY  05/02/2000; 05/08/2013  . COSMETIC SURGERY  02/22/2014   skin removal surgery from lower abdomen   . ENDOVENOUS ABLATION SAPHENOUS VEIN W/ LASER Right  08/12/2016   endovenous laser ablation right greater saphenous vein by Curt Jews MD   . ENDOVENOUS ABLATION SAPHENOUS VEIN W/ LASER Left 10/21/2016   EVLA L GSV by Curt Jews MD  . METATARSAL OSTEOTOMY Right 1995   Right Bunion Repair  . 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 SHOULDER ARTHROSCOPY WITH ROTATOR CUFF REPAIR AND SUBACROMIAL DECOMPRESSION Right 08/09/2013   Procedure: RIGHT SHOULDER ARTHROSCOPY WITH SUBACROMIAL DECOMPRESSION, DISTAL CLAVICLE EXCISION AND ROTATOR CUFF REPAIR and release biceps;  Surgeon: Ninetta Lights, MD;  Location: [facility];  Service: Orthopedics;  Laterality: Right;  . SUBTOTAL COLECTOMY  11/24/15   [place] Left   . TOTAL KNEE ARTHROPLASTY Right     OB History    No data available       Home Medications    Prior to Admission medications   Medication Sig Start Date End Date Taking? Authorizing Provider  aspirin 81 MG tablet Take 81 mg by mouth daily.    [provider]  atorvastatin (LIPITOR) 40 MG tablet TAKE ONE TABLET EACH DAY 07/01/17   Alveda Reasons, MD  calcium carbonate (OS-CAL) 600 MG TABS tablet Take 1 tablet (600 mg total) by mouth 2 (two) times daily with a meal. 01/07/14   Funches, Josalyn, MD  CARAFATE 1 GM/10ML suspension TAKE 5ml (2 TEASPOONSFUL) BY MOUTH 4 TIMES DAILY WITH MEALS AND ATBEDTIME 04/07/17   [name], MD  cetirizine (ZYRTEC) 10 MG tablet Take 1 tablet (10 mg total) by mouth daily. Patient taking differently: Take 10 mg by mouth daily as needed for allergies.  05/19/16   Mercy Riding, MD  cyclobenzaprine (FLEXERIL) 5 MG tablet Take 1 tablet (5 mg total) by mouth 3 (three) times daily as needed for muscle spasms. 04/01/17   [name], MD  docusate sodium (DOK) 100 MG capsule TAKE ONE CAPSULE TWICE A DAY AS NEEDED FOR CONSTIPATION 02/27/16   [name], MD  ferrous sulfate 220 (44 Fe) MG/5ML solution Take 5 mLs (220 mg total) by mouth 3 (three) times daily  with meals. 01/14/17   [name], MD  fluticasone (FLONASE) 50 MCG/ACT nasal spray TWO SPRAYS IN EACH NOSTRIL AT BEDTIME 09/21/17   Alveda Reasons, MD  furosemide (LASIX) 40 MG tablet Take 1 tablet (40 mg total) by mouth daily. 09/02/17   Mercy Riding, MD  gabapentin (NEURONTIN) 300 MG capsule Take 3 capsules (900 mg total) by mouth 3 (three) times daily. 07/01/17   Alveda Reasons, MD  hydrochlorothiazide (MICROZIDE) 12.5 MG capsule TAKE ONE CAPSULE EACH DAY 04/19/17   [name], MD  lisinopril (PRINIVIL,ZESTRIL) 20 MG tablet Take 1 tablet (20 mg total) by mouth daily. 07/01/17   Alveda Reasons, MD  Multiple Vitamin (MULTIVITAMIN) tablet Take 1 tablet by mouth daily. 01/07/14   Funches, Adriana Mccallum, MD  ondansetron (ZOFRAN) 4 MG tablet Take 1 tablet (4 mg total) by mouth every 6 (six) hours  as needed for nausea. 09/25/16   Everrett Coombe, MD  Oxycodone HCl 10 MG TABS Take 1 tablet (10 mg total) by mouth every 6 (six) hours as needed. 08/16/17   Dickie La, MD  pantoprazole (PROTONIX) 40 MG tablet Take 1 tablet (40 mg total) by mouth daily. 07/01/17   Alveda Reasons, MD  polyethylene glycol powder (GLYCOLAX/MIRALAX) powder Take 17 g by mouth 2 (two) times daily as needed. 05/16/17   Lind Covert, MD  SANTYL ointment Apply topically daily. To legs daily 07/01/17   Alveda Reasons, MD  traMADol (ULTRAM) 50 MG tablet Take 1 tablet (50 mg total) by mouth every 8 (eight) hours as needed. 09/02/17   Mercy Riding, MD  triamcinolone ointment (KENALOG) 0.5 % Apply 1 application topically 2 (two) times daily. 05/31/17   Alveda Reasons, MD  vitamin B-12 (CYANOCOBALAMIN) 1000 MCG tablet Take 1,000 mcg by mouth daily.    [provider]  vitamin C (ASCORBIC ACID) 250 MG tablet Take 250 mg by mouth daily.    [provider]  Vitamins A & D 5000-400 units CAPS Take 5,000 Units by mouth daily. 03/16/16   [provider]  zolpidem (AMBIEN) 5 MG tablet TAKE ONE TABLET AT  BEDTIME AS NEEDED FORSLEEP 09/21/17   Alveda Reasons, MD    Family History Family History  Problem Relation Age of Onset  . Cancer Father 23       Died from complications of colon CA  . Colon cancer Father        died in his 56s  . Diabetes Mother   . Hypertension Mother   . Diabetes Sister   . Heart disease Sister   . Diabetes Sister   . Stroke Sister   . Diabetes Brother   . Stroke Brother   . Diabetes Brother   . Diabetes Brother   . Diabetes Brother   . Hypertension Son     Social History Social History   Tobacco Use  . Smoking status: Former Smoker    Packs/day: 0.10    Years: 6.00    Pack years: 0.60    Last attempt to quit: 1976    Years since quitting: 42.9  . Smokeless tobacco: Never Used  Substance Use Topics  . Alcohol use: Yes    Alcohol/week: 0.0 oz    Comment: occasional wine  . Drug use: No     Allergies   Aspirin; Nsaids; and Tolmetin   Review of Systems Review of Systems  Musculoskeletal: Positive for arthralgias and myalgias.  All other systems reviewed and are negative.    Physical Exam Updated Vital Signs BP (!) 160/96 (BP Location: Right Arm)   Pulse 82   Temp 98.2 F (36.8 C) (Oral)   Resp 18   Ht 5\' 4"  (1.626 m)   Wt 83.9 kg (185 lb)   SpO2 100%   BMI 31.76 kg/m   Physical Exam  Constitutional: She is oriented to person, place, and time. She appears well-developed and well-nourished. No distress.  NAD.  HENT:  Head: Normocephalic and atraumatic.  Right Ear: External ear normal.  Left Ear: External ear normal.  Nose: Nose normal.  Eyes: Conjunctivae and EOM are normal. No scleral icterus.  Neck: Normal range of motion. Neck supple.  Cardiovascular: Normal rate, regular rhythm and normal heart sounds.  No murmur heard. Pulmonary/Chest: Effort normal and breath sounds normal. She has no wheezes.  Musculoskeletal: Normal range of motion. She exhibits  tenderness. She exhibits no deformity.  Focal tenderness along  DIP of left pinky finger, mild edema with decreased AROM 2/2 pain. No focal tenderness to wrist or anatomical snuffbox. Normal thumb opposition to all fingers.  Diffuse tenderness along left lateral shoulder/deltoid No obvious skin abnormalities including abrasions, ecchymosis, erythema, edema of LUE No point tenderness to sternum, anterior chest wall, scapula, clavicle, AC or [place] joints  Unable to perform special test for shoulder pt did not tolerate ROM of joint  Neurological: She is alert and oriented to person, place, and time.  Skin: Skin is warm and dry. Capillary refill takes less than 2 seconds.  Psychiatric: She has a normal mood and affect. Her behavior is normal. Judgment and thought content normal.  Nursing note and vitals reviewed.    ED Treatments / Results  Labs (all labs ordered are listed, but only abnormal results are displayed) Labs Reviewed - No data to display  EKG  EKG Interpretation None       Radiology Dg Shoulder Left  Result Date: 10/08/2017 CLINICAL DATA:  61 year old female status post fall this morning. EXAM: LEFT SHOULDER - 2+ VIEW COMPARISON:  02/28/2017 FINDINGS: There is no evidence of fracture or dislocation. Moderate acromioclavicular and glenohumeral degenerative changes. No other focal bone abnormality. Soft tissues are unremarkable. IMPRESSION: Moderate degenerative changes without acute fracture or dislocation. Electronically Signed   By: Kristopher Oppenheim M.D.   On: 10/08/2017 17:50   Dg Hand Complete Left  Result Date: 10/08/2017 CLINICAL DATA:  61 year old female status post fall this morning. EXAM: LEFT HAND - COMPLETE 3+ VIEW COMPARISON:  None. FINDINGS: There is no evidence of fracture or dislocation. There is no evidence of arthropathy or other focal bone abnormality. Soft tissues are unremarkable. IMPRESSION: Negative. Electronically Signed   By: Kristopher Oppenheim M.D.   On: 10/08/2017 17:51    Procedures Procedures (including critical care  time)  Medications Ordered in ED Medications  acetaminophen (TYLENOL) tablet 1,000 mg (1,000 mg Oral Given 10/08/17 1714)     Initial Impression / Assessment and Plan / ED Course  I have reviewed the triage vital signs and the nursing notes.  Pertinent labs & imaging results that were available during my care of the patient were reviewed by me and considered in my medical decision making (see chart for details).    61 year old presents with acute shoulder pain after mechanical fall. She has focal tenderness of left pinky finger DIP and lateral shoulder. Low suspicion for fracture however patient unable to tolerate full passive range of motion due to pain and exam is unreliable. We'll get x-rays, provide analgesia and ice and reassess. No head trauma, LOC, anticoagulants. Extremities otherwise neurovascularly intact.  X-rays negative. We'll treat symptomatically. Discussed return precautions. Patient in agreement with the treatment and discharge plan. Final Clinical Impressions(s) / ED Diagnoses   Final diagnoses:  Fall, initial encounter  Acute pain of left shoulder  Finger pain, left    ED Discharge Orders    None       Kinnie Feil, PA-C 10/08/17 1825    Fredia Sorrow, MD 10/09/17 [phone]

## 2017-10-08 NOTE — Discharge Instructions (Signed)
Your x-rays today were negative. I suspect your pain is from muscle soreness. Take ibuprofen or Tylenol for pain. Heat therapy in light massage will also help. Follow up with primary care doctor in 1 week if symptoms persist.

## 2017-10-08 NOTE — ED Notes (Signed)
Declined W/C at D/C and was escorted to lobby by RN. 

## 2017-10-12 ENCOUNTER — Other Ambulatory Visit: Payer: Self-pay

## 2017-10-12 NOTE — Patient Outreach (Signed)
[facility]) Care Management  10/12/2017  [name] 1956-04-12 [id number]    Telephone call to patient for ED utilizer screen.  Patient reports she is doing ok.  Discussed [facility] Care Management Services.  She declined services at this time.    Plan: RN CM will close case at this time and notify care management assistant of case status.  Hannah Baseman, RN, MSN [facility] Care Management Care Management Coordinator Direct Line [phone] Toll Free: [phone]  Fax: [phone]

## 2017-11-17 ENCOUNTER — Encounter: Payer: Self-pay | Admitting: Student

## 2017-11-17 ENCOUNTER — Ambulatory Visit (INDEPENDENT_AMBULATORY_CARE_PROVIDER_SITE_OTHER): Payer: Medicare HMO | Admitting: Student

## 2017-11-17 VITALS — BP 122/80 | HR 78 | Temp 97.5°F | Wt 188.6 lb

## 2017-11-17 DIAGNOSIS — M25562 Pain in left knee: Secondary | ICD-10-CM

## 2017-11-17 DIAGNOSIS — Z79899 Other long term (current) drug therapy: Secondary | ICD-10-CM | POA: Diagnosis not present

## 2017-11-17 DIAGNOSIS — I10 Essential (primary) hypertension: Secondary | ICD-10-CM | POA: Diagnosis not present

## 2017-11-17 DIAGNOSIS — H538 Other visual disturbances: Secondary | ICD-10-CM

## 2017-11-17 DIAGNOSIS — Z9884 Bariatric surgery status: Secondary | ICD-10-CM

## 2017-11-17 DIAGNOSIS — M25561 Pain in right knee: Secondary | ICD-10-CM

## 2017-11-17 DIAGNOSIS — R202 Paresthesia of skin: Secondary | ICD-10-CM | POA: Diagnosis not present

## 2017-11-17 HISTORY — DX: Paresthesia of skin: R20.2

## 2017-11-17 MED ORDER — GABAPENTIN 300 MG PO CAPS: ORAL_CAPSULE | ORAL | 0 refills | Status: DC

## 2017-11-17 NOTE — Patient Instructions (Signed)
It was great seeing you today! We have addressed the following issues today  Tingling pain: this is could be due to some arthritic changes in your neck pinching on your nerves.  I recommend resuming your gabapentin.  You can also try capsaicin cream 4 times a day.  We have also done some blood work to exclude some vitamin or mineral deficiency.  Eyelid swelling: This is a stye.  I recommend warm compress  for about 15 minutes 4-5 times a day.  Blurry vision: I strongly recommend you see your eye doctor as soon as possible.  If we did any lab work today, and the results require attention, either me or my nurse will get in touch with you. If everything is normal, you will get a letter in mail and a message via . If you don't hear from [place] in two weeks, please give [place] a call. Otherwise, we look forward to seeing you again at your next visit. If you have any questions or concerns before then, please call the clinic at [phone].  Please bring all your medications to every doctors visit  Sign up for My Chart to have easy access to your labs results, and communication with your Primary care physician.    Please check-out at the front desk before leaving the clinic.    Take Care,   Dr. Cyndia Skeeters

## 2017-11-17 NOTE — Progress Notes (Signed)
Subjective:    [name] is a 62 y.o. old female here "headache"  HPI "Headache": for two weeks. Mostly on the left side. She describes the pain as pins. Comes and goes. Last for about 30 minutes. Mostly at nights. Very frequent. Pain is 5/10 at its worst. Never had this type of pain. History of migraine headache as a child. Denies skin rash. Occasionally, numbness along the outer side of her left arm to her fingers. She also reports weakness in her left arm. She also reports blurry vision in both eyes for two weeks. She is here today because it started getting worse. She has an upcoming apt with sport medicine for her left shoulder tomorrow. Denies fever, speech changes, facial drooping Has history of CVA, cervicalgia and vertebral DDD.  She also reports history of bypass surgery.  She reports taking multivitamin. Denies smoking cigarettes, drinking alcohol or recreational drug use.  PMH/Problem List: has OBESITY, NOS; Iron deficiency anemia; HTN, goal below 140/90; Venous (peripheral) insufficiency; Allergic rhinitis; CYST, KIDNEY, ACQUIRED; CEREBROVASCULAR ACCIDENT, HX OF; Hypopigmented skin lesion; Asthma, cough variant; GERD (gastroesophageal reflux disease); Insomnia; External hemorrhoids without complication; Subacromial bursitis; Eczema; Biliary sludge; Premature supraventricular beats; Superficial thrombophlebitis; Hypoalbuminemia; Osteoarthritis of both knees; Cervical pain (neck); Lumbar back pain; DDD (degenerative disc disease), lumbar; Skin lesion of right lower extremity; Healthcare maintenance; Venous stasis ulcer of left ankle with fat layer exposed with varicose veins ([place]); Multiple open wounds of lower leg; Esophageal dysmotility; Rash and nonspecific skin eruption; Pain in joint, lower leg; Peripheral neuropathy; and Tingling on their problem list.   has a past medical history of Acute renal failure ([place]) (09/24/2016), Anemia, Arthritis, Back pain (10/04/2012), Blood in stool  (10/07/2014), Blood transfusion without reported diagnosis, Bunion (03/28/2014), Cellulitis of leg, left (09/14/2016), Chest pain (05/12/2015), Constipation (10/19/2013), Cramping of hands (02/27/2016), DE QUERVAIN'S TENOSYNOVITIS (11/30/2010), Degenerative arthritis of right shoulder region (08/2013), Diverticulosis, Diverticulosis of colon with hemorrhage, Diverticulosis of colon without hemorrhage (11/18/2014), GERD (gastroesophageal reflux disease), High cholesterol, History of GI bleed, Hypertension, Intractable vomiting (09/24/2016), Lower GI bleed, Medial meniscus tear (1997), Mini stroke ([place]), Muscle pain (05/26/2015), Rotator cuff rupture, complete (08/2013), Shoulder impingement (08/2013), Stroke ([place]) (2006), Ulcer, Wears dentures, and Wears partial dentures.  FH:  Family History  Problem Relation Age of Onset  . Cancer Father 59       Died from complications of colon CA  . Colon cancer Father        died in his 60s  . Diabetes Mother   . Hypertension Mother   . Diabetes Sister   . Heart disease Sister   . Diabetes Sister   . Stroke Sister   . Diabetes Brother   . Stroke Brother   . Diabetes Brother   . Diabetes Brother   . Diabetes Brother   . Hypertension Son     [facility] Social History   Tobacco Use  . Smoking status: Former Smoker    Packs/day: 0.10    Years: 6.00    Pack years: 0.60    Last attempt to quit: 1976    Years since quitting: 43.0  . Smokeless tobacco: Never Used  Substance Use Topics  . Alcohol use: Yes    Alcohol/week: 0.0 oz    Comment: occasional wine  . Drug use: No    Review of Systems Review of systems negative except for pertinent positives and negatives in history of present illness above.     Objective:     Vitals:   11/17/17  0848  BP: 122/80  Pulse: 78  Temp: (!) 97.5 F (36.4 C)  TempSrc: Oral  SpO2: 99%  Weight: 188 lb 9.6 oz (85.5 kg)   Body mass index is 32.37 kg/m.  Physical Exam  GEN: appears well, no apparent  distress. Head: normocephalic and atraumatic.  Some tenderness to palpation over left occipital region.  No apparent swelling or skin erythema.  No tenderness over temporal region. Eyes: PERRL, EOMI, peripheral visions intact.  She has arcus senilis HEM: negative for cervical or periauricular lymphadenopathies CVS: RRR, nl s1 & s2, no murmurs, no edema RESP: no IWOB, good air movement bilaterally, CTAB MSK: Some tenderness to palpation over left occipital region and mild tenderness over her left neck, spurling test negative SKIN: no apparent skin lesion NEURO: Awake, alert and oriented appropriately. Cranial nerves II-XII intact, motor 4+/5 in all muscle groups of UE and LE bilaterally, normal tone, light sensation intact in all dermatomes of upper and lower ext bilaterally, no pronator drift, biceps and patellar reflexes 1+ bilaterally, finger to nose intact, gait normal.  PSYCH: euthymic mood with congruent affect    Assessment and Plan:  1. Tingling: History and exam suggestive for some radiculopathy.  Tingling over the left side of her head, neck and outer aspect of left arm.  Neuro exam within normal limits.  She says she was told to stop the gabapentin.  She also have history of bypass surgery.  So there is a possibility of some micronutrient deficiency contributing.  No tenderness over temporal region to think of temporal arteritis.  Will resume gabapentin at 300 mg in the morning, 300 mg at lunch and 900 mg at bedtime.  I also recommended using capsaicin cream 4 times a day.  Will obtain CBC, CMP, vitamin B1 and vitamin B12 level for history of bypass surgery.  She has an appointment with sports medicine tomorrow.  They may order some imaging for her neck.   2. H/O gastric bypass: taking her multivitamins. - CBC with Differential/Platelet - Anemia panel - Vitamin B1  3. Blurry vision, bilateral: I do not think this is related to her tingling.  Recommended calling her ophthalmologist for  follow-up on this.  Return if symptoms worsen or fail to improve.  Mercy Riding, MD 11/17/17 Pager: [phone]

## 2017-11-18 ENCOUNTER — Encounter: Payer: Self-pay | Admitting: Student

## 2017-11-18 ENCOUNTER — Encounter: Payer: Self-pay | Admitting: Family Medicine

## 2017-11-18 ENCOUNTER — Ambulatory Visit: Payer: Medicare HMO | Admitting: Family Medicine

## 2017-11-18 VITALS — BP 140/82 | HR 89 | Ht 64.0 in | Wt 180.0 lb

## 2017-11-18 DIAGNOSIS — Z9884 Bariatric surgery status: Secondary | ICD-10-CM

## 2017-11-18 DIAGNOSIS — M19012 Primary osteoarthritis, left shoulder: Secondary | ICD-10-CM

## 2017-11-18 HISTORY — DX: Bariatric surgery status: Z98.84

## 2017-11-18 MED ORDER — METHYLPREDNISOLONE ACETATE 40 MG/ML IJ SUSP
40.0000 mg | Freq: Once | INTRAMUSCULAR | Status: AC
  Administered 2017-11-18: 40 mg via INTRA_ARTICULAR

## 2017-11-18 NOTE — Progress Notes (Signed)
Chief complaint: Left shoulder pain 1 month, left fifth finger pain 1 week  History of present illness: [name] is a 62 year old right-hand dominant female who presents to the sports medicine office today with chief complaint of left shoulder pain. She reports that symptoms have been present for approximately 1 month. She does not report of any inciting incident, trauma, or injury to explain the pain. She reports that any type of shoulder extension or reaching across to grab something are aggravating factors. She does not report of any numbness, tingling, or burning down her left arm. She does not report of any radiating pain going down her left arm and to her hands and fingers. She reports that she has tried Tylenol, with no relief of symptoms. She has also tried tramadol, with no relief of symptoms. She reports that she cannot take NSAIDs due to GI bleeding. She does not report of any previous shoulder injury. She reports that she has had right rotator cuff surgery and rehabilitation done not too long ago. She describes the pain currently as a constant, sharp pain. She reports today pain is a 3/10. She does report symptoms waking her up at nighttime. 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  He also reports today of left fifth finger pain. She reports that pain is present at the left fifth PIP. She reports that she fell and jammed her finger while at church last week. She reports that she did go to local urgent care, was told that she did not have any fracture. She is still having pain and swelling today. Reports pain with flexion.  Review of systems:  As stated above  Her past medical history, surgical history, family history, and social history obtained and reviewed. Medical history notable for hypertension, peripheral venous insufficiency, PSVT, cough variant asthma, GERD, iron deficiency anemia; surgical history notable for endovenous ablation, subtotal colectomy, Roux-en-Y  gastric bypass, abdominal hysterectomy, and right total knee arthroplasty; reports former tobacco use; history notable for colon cancer, hypertension, diabetes, CVA  Physical exam: Vital signs are reviewed and are documented in the chart Gen.: Alert, oriented, appears stated age, in no apparent distress HEENT: Moist oral mucosa Respiratory: Normal respirations, able to speak in full sentences Cardiac: Regular rate, distal pulses 2+ Integumentary: No rashes on visible skin:  Neurologic: She is slightly weak with left rotator cuff strength testing secondary to pain, specifically with supraspinatus and infraspinatus muscles testing, has good strength with left fifth finger PIP flexion and extension, sensation intact in bilateral upper extremities Psych: Normal affect, mood is described as good Musculoskeletal: Inspection of left shoulder reveals no obvious deformity or muscle atrophy, no warmth, erythema, ecchymosis, or effusion, she is tender to palpation over the [facility] joint, as well as supraspinatus distal attachment the humeral head, pain noticed with shoulder abduction and external range of motion, she does have full shoulder range of motion, does have pain lifting her arm past 90 of forward flexion and abduction, normal internal range of motion, empty can, cross arm, Hawkins, Neers, O'Brian are all positive, Spurling negative, speed and Yergason negative, strength as noted above  Musculoskeletal ultrasound was performed in the office today of her left shoulder. The findings of the ultrasound revealed that she does have partial degenerative tearing of the supraspinatus, particularly on the articular side proximally, no color flow was seen, she also has very prominent Geyser sign noted over the [place] joint, he also has arthritic changes noted on the articular side of supraspinatus with  cortical irregularity over the humeral head, is also has tendinopathy with small hypoechoic changes seen along  infraspinatus muscle and tendon, she has unremarkable biceps, subscapularis, and teres minor; her sound was also performed on her left fifth PIP, do see some hypoechoic changes and soft tissue swelling noted when viewing the PIP from the radial and ulnar side, but no evidence of any avulsion fracture  Procedure note: Written informed consent obtained, outlining risk of bleeding, infection, and steroid flare. She does agree to proceed with ultrasound-guided cortisone injection into the [facility] joint. Sterile ultrasound probe was applied, same as sterile ultrasound gel. Using a 2 cc percent lidocaine without epinephrine and 1 cc Depo-Medrol, 21-gauge 1.5 inch needle, her left AC joint was injected, with ultrasound verifying accurate placement of medicine. Band-aid was then applied This did provide her relief of symptoms within 5-10 minutes. She was monitored for 5-10 minutes with no complications noted.  Assessment and plan: 1. Chronic left shoulder pain, with U/S evidence of AC joint osteoarthritis 2. 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 Left 5th PIP joint ligament sprain  Plan: Feel that most problematic issue for her today is AC joint osteoarthritis, as she is particularly sore with cross arm testing and has very prominent Geyser sign on ultrasound. Opted for cortisone injection of the [facility] joint under ultrasound guidance. She is agreeable to this. Written informed consent obtained. Injection done as noted above without any complications noted. Will have her do some rotator cuff light strengthening exercises using Theraband. For her left finger pain, will buddy tape it. Gave realistic expectations that she will have slight swelling and pain, particularly with flexion, for a few months. Will have her return in 4 weeks or sooner as needed.    Mort Sawyers, M.D. [place]

## 2017-11-20 NOTE — Progress Notes (Signed)
[facility]: Attending Note: I have reviewed the chart, discussed wit the Sports Medicine Fellow. I agree with assessment and treatment plan as detailed in the Fellow's note.  

## 2017-11-22 LAB — CBC WITH DIFFERENTIAL/PLATELET
Basophils Absolute: 0 10*3/uL (ref 0.0–0.2)
Basos: 0 %
EOS (ABSOLUTE): 0.2 10*3/uL (ref 0.0–0.4)
Eos: 3 %
Hemoglobin: 10.1 g/dL — ABNORMAL LOW (ref 11.1–15.9)
Immature Grans (Abs): 0 10*3/uL (ref 0.0–0.1)
Immature Granulocytes: 0 %
Lymphocytes Absolute: 1.5 10*3/uL (ref 0.7–3.1)
Lymphs: 31 %
MCH: 25.3 pg — ABNORMAL LOW (ref 26.6–33.0)
MCHC: 31.2 g/dL — ABNORMAL LOW (ref 31.5–35.7)
MCV: 81 fL (ref 79–97)
Monocytes Absolute: 0.4 10*3/uL (ref 0.1–0.9)
Monocytes: 7 %
Neutrophils Absolute: 2.9 10*3/uL (ref 1.4–7.0)
Neutrophils: 59 %
Platelets: 279 10*3/uL (ref 150–379)
RBC: 4 x10E6/uL (ref 3.77–5.28)
RDW: 16.3 % — ABNORMAL HIGH (ref 12.3–15.4)
WBC: 5 10*3/uL (ref 3.4–10.8)

## 2017-11-22 LAB — CMP14+EGFR
ALT: 25 IU/L (ref 0–32)
AST: 24 IU/L (ref 0–40)
Albumin/Globulin Ratio: 1.4 (ref 1.2–2.2)
Albumin: 4.3 g/dL (ref 3.6–4.8)
Alkaline Phosphatase: 79 IU/L (ref 39–117)
BUN/Creatinine Ratio: 20 (ref 12–28)
BUN: 18 mg/dL (ref 8–27)
Bilirubin Total: 0.3 mg/dL (ref 0.0–1.2)
CO2: 25 mmol/L (ref 20–29)
Calcium: 9.6 mg/dL (ref 8.7–10.3)
Chloride: 105 mmol/L (ref 96–106)
Creatinine, Ser: 0.88 mg/dL (ref 0.57–1.00)
GFR calc Af Amer: 82 mL/min/{1.73_m2} (ref >59)
GFR calc non Af Amer: 71 mL/min/{1.73_m2} (ref >59)
Globulin, Total: 3 g/dL (ref 1.5–4.5)
Glucose: 76 mg/dL (ref 65–99)
Potassium: 3.8 mmol/L (ref 3.5–5.2)
Sodium: 145 mmol/L — ABNORMAL HIGH (ref 134–144)
Total Protein: 7.3 g/dL (ref 6.0–8.5)

## 2017-11-22 LAB — ANEMIA PANEL
Ferritin: 25 ng/mL (ref 15–150)
Folate, Hemolysate: 380.4 ng/mL
Folate, RBC: 1174 ng/mL (ref >498)
Hematocrit: 32.4 % — ABNORMAL LOW (ref 34.0–46.6)
Iron Saturation: 5 % — CL (ref 15–55)
Iron: 25 ug/dL — ABNORMAL LOW (ref 27–139)
Retic Ct Pct: 1.1 % (ref 0.6–2.6)
Total Iron Binding Capacity: 531 ug/dL — ABNORMAL HIGH (ref 250–450)
UIBC: 506 ug/dL — ABNORMAL HIGH (ref 118–369)
Vitamin B-12: 1188 pg/mL (ref 232–1245)

## 2017-11-22 LAB — VITAMIN B1: Thiamine: 116.9 nmol/L (ref 66.5–200.0)

## 2017-11-23 ENCOUNTER — Other Ambulatory Visit: Payer: Self-pay | Admitting: Student

## 2017-11-23 ENCOUNTER — Encounter: Payer: Self-pay | Admitting: Student

## 2017-11-23 DIAGNOSIS — Z9884 Bariatric surgery status: Secondary | ICD-10-CM

## 2017-11-23 DIAGNOSIS — D509 Iron deficiency anemia, unspecified: Secondary | ICD-10-CM

## 2017-11-23 MED ORDER — FERROUS SULFATE 220 (44 FE) MG/5ML PO ELIX: 220.0000 mg | ORAL_SOLUTION | Freq: Three times a day (TID) | ORAL | 2 refills | Status: DC

## 2017-11-23 NOTE — Progress Notes (Signed)
Called patient to discuss about her lab results from her recent visits with me.  Patient with iron deficiency anemia and history of bypass surgery.  Hemoglobin 10.1 (stable).  Iron panel consistent with iron deficiency anemia with iron saturation to 5.  She is still is not taking her iron.  She says she was told that her iron level was high, which had been making her constipated in the past.  I discussed about the lab finding.  Encouraged her to resume taking iron.  I also refilled her iron.  I recommended using MiraLAX as needed for constipation.  Patient voiced understanding.  She agrees.  She appreciated the call.  Result letter sent to patient.

## 2017-12-21 DIAGNOSIS — Z9884 Bariatric surgery status: Secondary | ICD-10-CM | POA: Diagnosis not present

## 2017-12-21 DIAGNOSIS — E669 Obesity, unspecified: Secondary | ICD-10-CM | POA: Diagnosis not present

## 2017-12-21 DIAGNOSIS — Z6832 Body mass index (BMI) 32.0-32.9, adult: Secondary | ICD-10-CM | POA: Diagnosis not present

## 2017-12-21 DIAGNOSIS — Z713 Dietary counseling and surveillance: Secondary | ICD-10-CM | POA: Diagnosis not present

## 2017-12-23 ENCOUNTER — Ambulatory Visit (INDEPENDENT_AMBULATORY_CARE_PROVIDER_SITE_OTHER): Payer: Medicare HMO | Admitting: Family Medicine

## 2017-12-23 ENCOUNTER — Encounter: Payer: Self-pay | Admitting: Family Medicine

## 2017-12-23 DIAGNOSIS — M19019 Primary osteoarthritis, unspecified shoulder: Secondary | ICD-10-CM | POA: Insufficient documentation

## 2017-12-23 DIAGNOSIS — M19012 Primary osteoarthritis, left shoulder: Secondary | ICD-10-CM | POA: Diagnosis not present

## 2017-12-23 HISTORY — DX: Primary osteoarthritis, unspecified shoulder: M19.019

## 2017-12-23 NOTE — Assessment & Plan Note (Signed)
Patient is here with complaints consistent with severe left-sided AC joint arthritis.  Prior left shoulder radiographs reviewed today.  There is some concerns that patient may have osteophyte development inferiorly which may be causing irritation and degenerative wear over the rotator cuff.  We discussed conservative versus surgical treatment. -Referral to orthopedic surgery (Dr. Fredonia Highland) for patient to discuss the possibility of acromioplasty, if warranted. -Discussed RICE therapy and NSAIDs as needed. -Follow-up as needed

## 2017-12-23 NOTE — Progress Notes (Signed)
[facility]: Attending Note: I have reviewed the chart, discussed wit the Sports Medicine Fellow. I agree with assessment and treatment plan as detailed in the [place] note. I agree with surgical evaluation for acromioplasty as her AC joint arthropathy is impressive.Noted the ostephytes in rotator interval.

## 2017-12-23 NOTE — Patient Instructions (Signed)
Dr Fredonia Highland [place] [id number] [phone] Wednesday 12/28/17 at [facility]

## 2017-12-23 NOTE — Progress Notes (Signed)
   HPI  CC: Left shoulder pain Patient is here to follow-up regarding her left-sided shoulder pain.  She was last seen a little over a month ago and received an [facility] joint injection.  She endorses improved symptoms since the injection however she continues to have some limitations in her range of motion.  She also has pain with overhead movements.  Both of these issues are not experienced on the right side.  Patient denies any new injury, trauma, or event which may have flared this.  She states that she is pleased with her current progress but still feels as though her limitations need to improve before they are tolerable.  Medications/Interventions Tried: AC joint injection, HEP, NSAIDs  See HPI and/or previous note for associated ROS.  Objective: BP 116/86   Ht 5\' 4"  (1.626 m)   Wt 185 lb (83.9 kg)   BMI 31.76 kg/m  Gen: NAD, well groomed, a/o x3, normal affect.  CV: Well-perfused. Warm.  Resp: Non-labored.  Neuro: Sensation intact throughout. No gross coordination deficits.  Gait: Nonpathologic posture, unremarkable stride without signs of limp or balance issues. Shoulder, Left: TTP noted at the lateral aspect of the shoulder and the [facility] joint. No evidence of asymmetry, or muscle atrophy; No tenderness over long head of biceps (bicipital groove). TTP at [facility] joint with some evidence of osteophyte development over this area. ROM reduced in forward flexion and abduction (130 and 110 to respectfully), Passive ROM less limited but very painful. Strength 5/5 throughout. No abnormal scapular function observed. Sensation intact. Peripheral pulses intact.  Special Tests:   - Crossarm test: Pos   - Empty can: Pos   - Hawkins: Pos   - Obrien's test: Pos   - Yergason's: NEG   - Speeds test: NEG   Assessment and plan:  AC (acromioclavicular) joint arthritis Patient is here with complaints consistent with severe left-sided AC joint arthritis.  Prior left shoulder radiographs reviewed today.   There is some concerns that patient may have osteophyte development inferiorly which may be causing irritation and degenerative wear over the rotator cuff.  We discussed conservative versus surgical treatment. -Referral to orthopedic surgery (Dr. Fredonia Highland) for patient to discuss the possibility of acromioplasty, if warranted. -Discussed RICE therapy and NSAIDs as needed. -Follow-up as needed   Elberta Leatherwood, MD,MS [place] Fellow 12/23/2017 1:23 PM

## 2017-12-30 DIAGNOSIS — M25512 Pain in left shoulder: Secondary | ICD-10-CM | POA: Diagnosis not present

## 2017-12-30 DIAGNOSIS — M25562 Pain in left knee: Secondary | ICD-10-CM | POA: Diagnosis not present

## 2018-01-13 ENCOUNTER — Ambulatory Visit: Payer: Medicare HMO | Admitting: Family Medicine

## 2018-01-17 DIAGNOSIS — H25012 Cortical age-related cataract, left eye: Secondary | ICD-10-CM | POA: Diagnosis not present

## 2018-01-17 DIAGNOSIS — E119 Type 2 diabetes mellitus without complications: Secondary | ICD-10-CM | POA: Diagnosis not present

## 2018-01-17 LAB — HM DIABETES EYE EXAM

## 2018-01-19 DIAGNOSIS — M25512 Pain in left shoulder: Secondary | ICD-10-CM | POA: Diagnosis not present

## 2018-01-23 DIAGNOSIS — M25512 Pain in left shoulder: Secondary | ICD-10-CM | POA: Diagnosis not present

## 2018-02-01 ENCOUNTER — Encounter: Payer: Self-pay | Admitting: Family Medicine

## 2018-02-01 ENCOUNTER — Other Ambulatory Visit: Payer: Self-pay | Admitting: Family Medicine

## 2018-02-06 ENCOUNTER — Ambulatory Visit: Payer: Medicare HMO | Admitting: Family Medicine

## 2018-02-09 ENCOUNTER — Other Ambulatory Visit: Payer: Self-pay | Admitting: Family Medicine

## 2018-03-07 IMAGING — CR DG ABDOMEN ACUTE W/ 1V CHEST
3 series · 3 of 3 positions shown · non-contrast
Comparison: Chest 03/08/2016.  Abdomen 09/18/2015

CLINICAL DATA: Abdominal pain and vomiting for 24 hours. Fevers
yesterday.

EXAM:
DG ABDOMEN ACUTE W/ 1V CHEST

[chest pa]
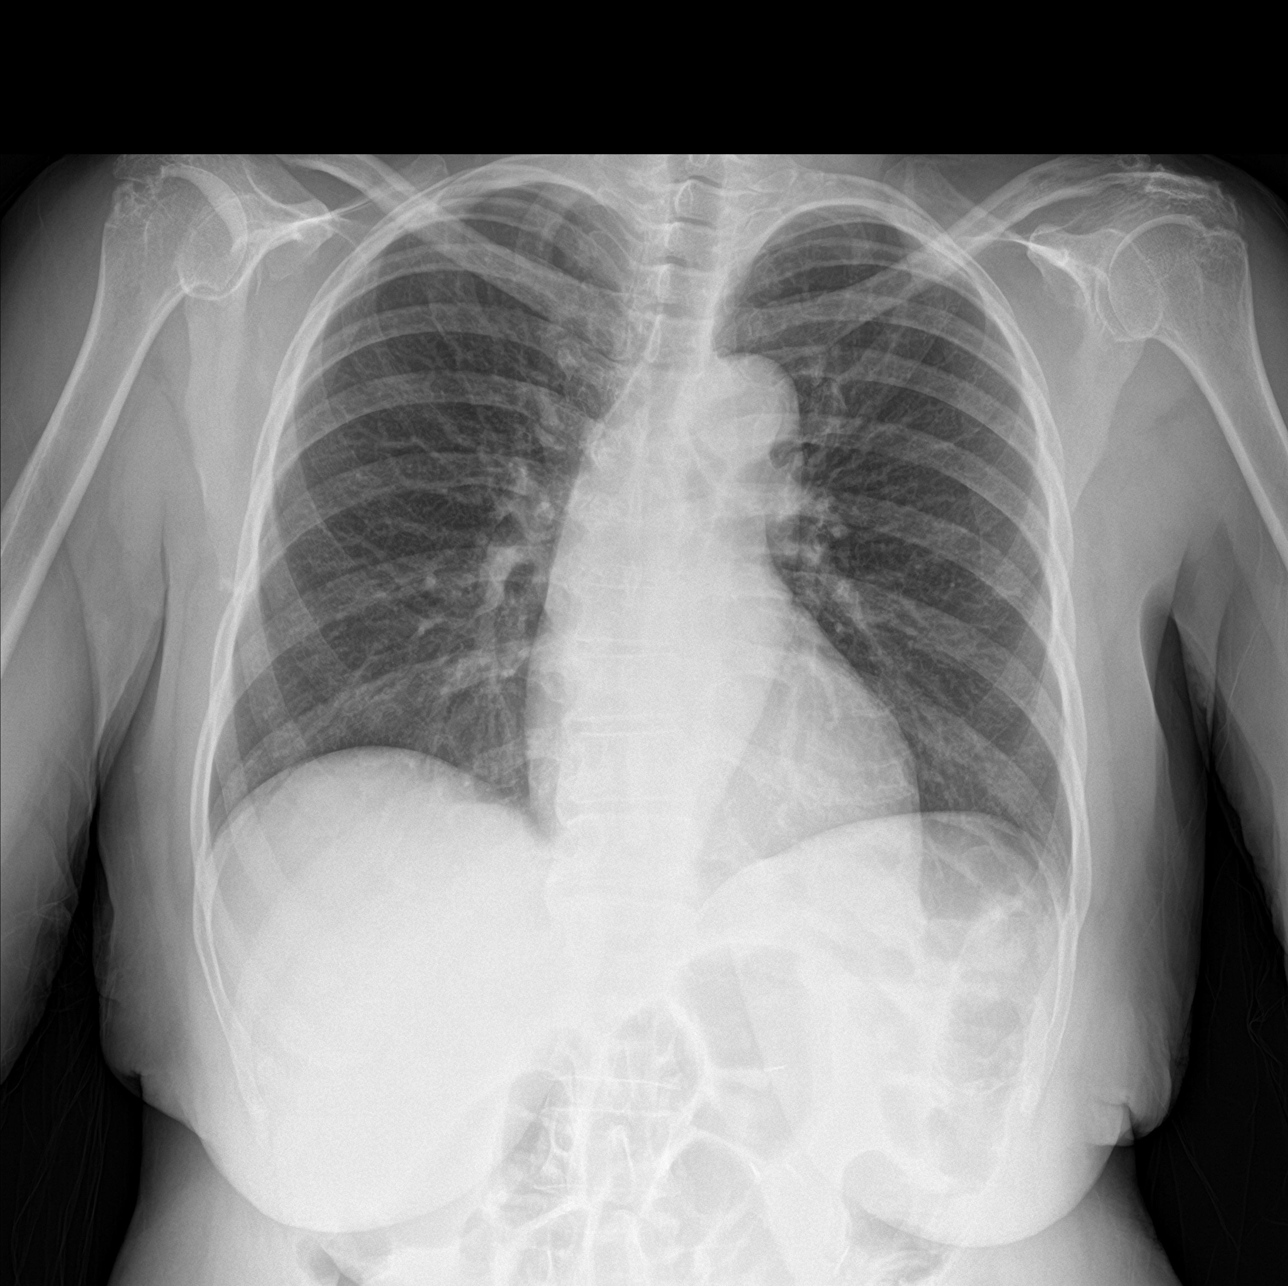

[abdomen erect]
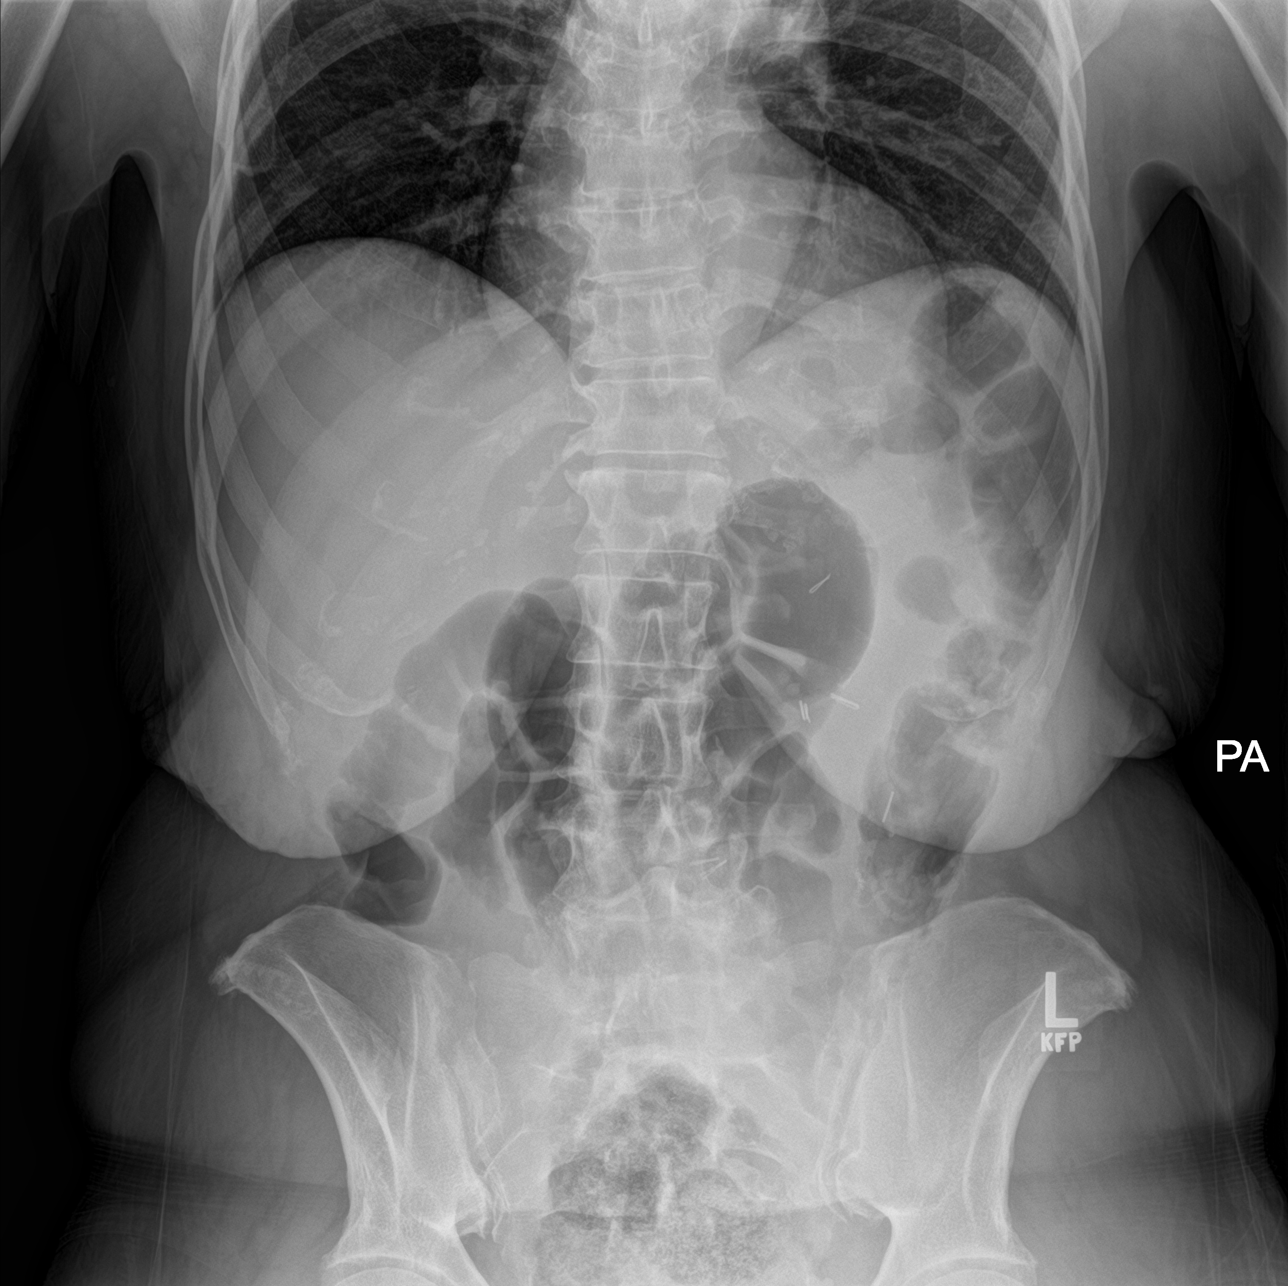

[abdomen supine]
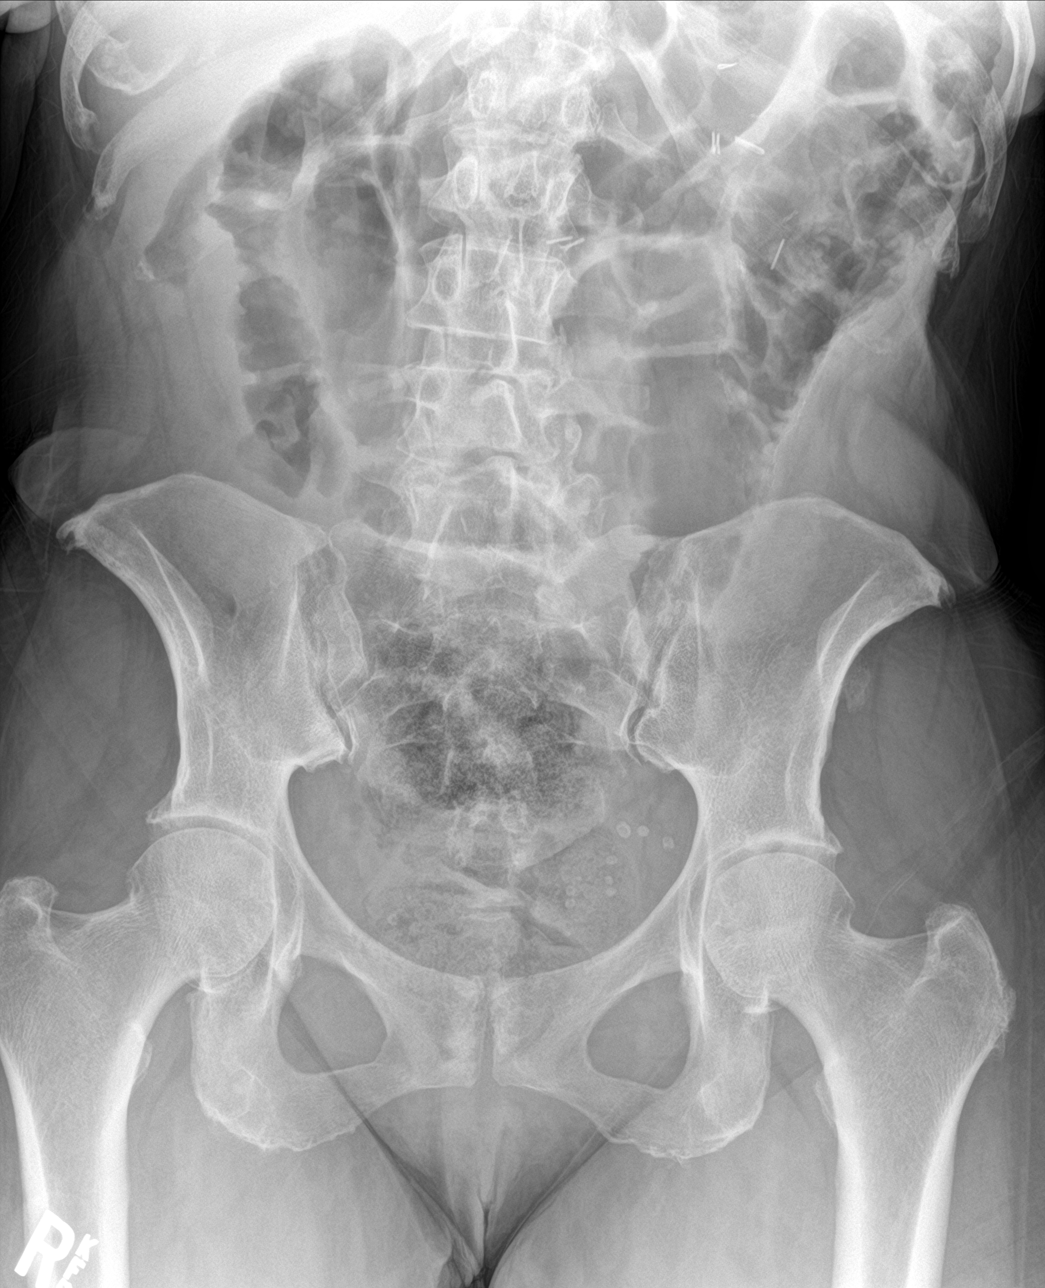

[3 of 3 positions shown; findings below may reference images not displayed]

FINDINGS: Normal heart size and pulmonary vascularity. No focal airspace
disease or consolidation in the lungs. No blunting of costophrenic
angles. No pneumothorax. Mediastinal contours appear intact.
Tortuous aorta. Degenerative changes in the spine and shoulders.

Gas and stool throughout the colon. No small or large bowel
distention. No free intra-abdominal air. No abnormal air-fluid
levels. Surgical clips in the left abdomen. Prominent stool in the
rectosigmoid colon. No radiopaque stones. Calcified phleboliths in
the pelvis. Degenerative changes in the spine and hips.
IMPRESSION: No evidence of active pulmonary disease. Nonobstructive bowel gas
pattern with prominent stool in the rectosigmoid colon.

## 2018-03-09 DIAGNOSIS — E669 Obesity, unspecified: Secondary | ICD-10-CM | POA: Diagnosis not present

## 2018-03-09 DIAGNOSIS — Z6833 Body mass index (BMI) 33.0-33.9, adult: Secondary | ICD-10-CM | POA: Diagnosis not present

## 2018-03-09 DIAGNOSIS — Z713 Dietary counseling and surveillance: Secondary | ICD-10-CM | POA: Diagnosis not present

## 2018-03-09 DIAGNOSIS — Z9884 Bariatric surgery status: Secondary | ICD-10-CM | POA: Diagnosis not present

## 2018-04-27 DIAGNOSIS — M7542 Impingement syndrome of left shoulder: Secondary | ICD-10-CM | POA: Diagnosis not present

## 2018-04-27 DIAGNOSIS — M7522 Bicipital tendinitis, left shoulder: Secondary | ICD-10-CM | POA: Diagnosis not present

## 2018-04-27 DIAGNOSIS — G8918 Other acute postprocedural pain: Secondary | ICD-10-CM | POA: Diagnosis not present

## 2018-04-27 DIAGNOSIS — S46012A Strain of muscle(s) and tendon(s) of the rotator cuff of left shoulder, initial encounter: Secondary | ICD-10-CM | POA: Diagnosis not present

## 2018-04-27 DIAGNOSIS — M24112 Other articular cartilage disorders, left shoulder: Secondary | ICD-10-CM | POA: Diagnosis not present

## 2018-04-27 DIAGNOSIS — M19012 Primary osteoarthritis, left shoulder: Secondary | ICD-10-CM | POA: Diagnosis not present

## 2018-04-27 HISTORY — PX: ROTATOR CUFF REPAIR: SHX139

## 2018-05-08 DIAGNOSIS — M19012 Primary osteoarthritis, left shoulder: Secondary | ICD-10-CM | POA: Diagnosis not present

## 2018-05-10 DIAGNOSIS — M25612 Stiffness of left shoulder, not elsewhere classified: Secondary | ICD-10-CM | POA: Diagnosis not present

## 2018-05-10 DIAGNOSIS — M75102 Unspecified rotator cuff tear or rupture of left shoulder, not specified as traumatic: Secondary | ICD-10-CM | POA: Diagnosis not present

## 2018-05-10 DIAGNOSIS — M6281 Muscle weakness (generalized): Secondary | ICD-10-CM | POA: Diagnosis not present

## 2018-05-10 DIAGNOSIS — M25512 Pain in left shoulder: Secondary | ICD-10-CM | POA: Diagnosis not present

## 2018-05-16 DIAGNOSIS — M6281 Muscle weakness (generalized): Secondary | ICD-10-CM | POA: Diagnosis not present

## 2018-05-16 DIAGNOSIS — M25512 Pain in left shoulder: Secondary | ICD-10-CM | POA: Diagnosis not present

## 2018-05-16 DIAGNOSIS — M75102 Unspecified rotator cuff tear or rupture of left shoulder, not specified as traumatic: Secondary | ICD-10-CM | POA: Diagnosis not present

## 2018-05-16 DIAGNOSIS — M25612 Stiffness of left shoulder, not elsewhere classified: Secondary | ICD-10-CM | POA: Diagnosis not present

## 2018-05-18 DIAGNOSIS — M25512 Pain in left shoulder: Secondary | ICD-10-CM | POA: Diagnosis not present

## 2018-05-18 DIAGNOSIS — M75102 Unspecified rotator cuff tear or rupture of left shoulder, not specified as traumatic: Secondary | ICD-10-CM | POA: Diagnosis not present

## 2018-05-18 DIAGNOSIS — M25612 Stiffness of left shoulder, not elsewhere classified: Secondary | ICD-10-CM | POA: Diagnosis not present

## 2018-05-18 DIAGNOSIS — M6281 Muscle weakness (generalized): Secondary | ICD-10-CM | POA: Diagnosis not present

## 2018-05-23 ENCOUNTER — Other Ambulatory Visit: Payer: Self-pay | Admitting: Family Medicine

## 2018-05-23 DIAGNOSIS — M25512 Pain in left shoulder: Secondary | ICD-10-CM | POA: Diagnosis not present

## 2018-05-23 DIAGNOSIS — M25612 Stiffness of left shoulder, not elsewhere classified: Secondary | ICD-10-CM | POA: Diagnosis not present

## 2018-05-23 DIAGNOSIS — M75102 Unspecified rotator cuff tear or rupture of left shoulder, not specified as traumatic: Secondary | ICD-10-CM | POA: Diagnosis not present

## 2018-05-23 DIAGNOSIS — M6281 Muscle weakness (generalized): Secondary | ICD-10-CM | POA: Diagnosis not present

## 2018-05-25 DIAGNOSIS — M25612 Stiffness of left shoulder, not elsewhere classified: Secondary | ICD-10-CM | POA: Diagnosis not present

## 2018-05-25 DIAGNOSIS — M75102 Unspecified rotator cuff tear or rupture of left shoulder, not specified as traumatic: Secondary | ICD-10-CM | POA: Diagnosis not present

## 2018-05-25 DIAGNOSIS — M6281 Muscle weakness (generalized): Secondary | ICD-10-CM | POA: Diagnosis not present

## 2018-05-25 DIAGNOSIS — M25512 Pain in left shoulder: Secondary | ICD-10-CM | POA: Diagnosis not present

## 2018-05-26 ENCOUNTER — Other Ambulatory Visit: Payer: Self-pay | Admitting: Family Medicine

## 2018-05-26 DIAGNOSIS — Z0000 Encounter for general adult medical examination without abnormal findings: Secondary | ICD-10-CM

## 2018-05-29 ENCOUNTER — Other Ambulatory Visit: Payer: Self-pay | Admitting: Family Medicine

## 2018-05-30 ENCOUNTER — Other Ambulatory Visit: Payer: Self-pay

## 2018-05-30 DIAGNOSIS — M6281 Muscle weakness (generalized): Secondary | ICD-10-CM | POA: Diagnosis not present

## 2018-05-30 DIAGNOSIS — M25612 Stiffness of left shoulder, not elsewhere classified: Secondary | ICD-10-CM | POA: Diagnosis not present

## 2018-05-30 DIAGNOSIS — I872 Venous insufficiency (chronic) (peripheral): Secondary | ICD-10-CM

## 2018-05-30 DIAGNOSIS — M75102 Unspecified rotator cuff tear or rupture of left shoulder, not specified as traumatic: Secondary | ICD-10-CM | POA: Diagnosis not present

## 2018-05-30 DIAGNOSIS — M25512 Pain in left shoulder: Secondary | ICD-10-CM | POA: Diagnosis not present

## 2018-05-31 MED ORDER — FUROSEMIDE 40 MG PO TABS: 40.0000 mg | ORAL_TABLET | Freq: Every day | ORAL | 0 refills | Status: DC

## 2018-06-01 DIAGNOSIS — M75102 Unspecified rotator cuff tear or rupture of left shoulder, not specified as traumatic: Secondary | ICD-10-CM | POA: Diagnosis not present

## 2018-06-01 DIAGNOSIS — M25612 Stiffness of left shoulder, not elsewhere classified: Secondary | ICD-10-CM | POA: Diagnosis not present

## 2018-06-01 DIAGNOSIS — F4322 Adjustment disorder with anxiety: Secondary | ICD-10-CM | POA: Diagnosis not present

## 2018-06-01 DIAGNOSIS — M25512 Pain in left shoulder: Secondary | ICD-10-CM | POA: Diagnosis not present

## 2018-06-01 DIAGNOSIS — M6281 Muscle weakness (generalized): Secondary | ICD-10-CM | POA: Diagnosis not present

## 2018-06-05 DIAGNOSIS — M25512 Pain in left shoulder: Secondary | ICD-10-CM | POA: Diagnosis not present

## 2018-06-06 DIAGNOSIS — M25512 Pain in left shoulder: Secondary | ICD-10-CM | POA: Diagnosis not present

## 2018-06-06 DIAGNOSIS — M6281 Muscle weakness (generalized): Secondary | ICD-10-CM | POA: Diagnosis not present

## 2018-06-06 DIAGNOSIS — M75102 Unspecified rotator cuff tear or rupture of left shoulder, not specified as traumatic: Secondary | ICD-10-CM | POA: Diagnosis not present

## 2018-06-06 DIAGNOSIS — M25612 Stiffness of left shoulder, not elsewhere classified: Secondary | ICD-10-CM | POA: Diagnosis not present

## 2018-06-08 DIAGNOSIS — M25612 Stiffness of left shoulder, not elsewhere classified: Secondary | ICD-10-CM | POA: Diagnosis not present

## 2018-06-08 DIAGNOSIS — M6281 Muscle weakness (generalized): Secondary | ICD-10-CM | POA: Diagnosis not present

## 2018-06-08 DIAGNOSIS — M25512 Pain in left shoulder: Secondary | ICD-10-CM | POA: Diagnosis not present

## 2018-06-08 DIAGNOSIS — M75102 Unspecified rotator cuff tear or rupture of left shoulder, not specified as traumatic: Secondary | ICD-10-CM | POA: Diagnosis not present

## 2018-06-13 DIAGNOSIS — M75102 Unspecified rotator cuff tear or rupture of left shoulder, not specified as traumatic: Secondary | ICD-10-CM | POA: Diagnosis not present

## 2018-06-13 DIAGNOSIS — M6281 Muscle weakness (generalized): Secondary | ICD-10-CM | POA: Diagnosis not present

## 2018-06-13 DIAGNOSIS — M25612 Stiffness of left shoulder, not elsewhere classified: Secondary | ICD-10-CM | POA: Diagnosis not present

## 2018-06-13 DIAGNOSIS — M25512 Pain in left shoulder: Secondary | ICD-10-CM | POA: Diagnosis not present

## 2018-06-14 ENCOUNTER — Encounter: Payer: Self-pay | Admitting: Family Medicine

## 2018-06-14 ENCOUNTER — Other Ambulatory Visit: Payer: Self-pay

## 2018-06-14 ENCOUNTER — Ambulatory Visit (INDEPENDENT_AMBULATORY_CARE_PROVIDER_SITE_OTHER): Payer: Medicare HMO | Admitting: Family Medicine

## 2018-06-14 VITALS — BP 128/60 | HR 47 | Temp 98.3°F | Ht 64.0 in | Wt 202.6 lb

## 2018-06-14 DIAGNOSIS — I872 Venous insufficiency (chronic) (peripheral): Secondary | ICD-10-CM | POA: Diagnosis not present

## 2018-06-14 DIAGNOSIS — D509 Iron deficiency anemia, unspecified: Secondary | ICD-10-CM

## 2018-06-14 DIAGNOSIS — R21 Rash and other nonspecific skin eruption: Secondary | ICD-10-CM

## 2018-06-14 DIAGNOSIS — Z8639 Personal history of other endocrine, nutritional and metabolic disease: Secondary | ICD-10-CM | POA: Diagnosis not present

## 2018-06-14 MED ORDER — TRIAMCINOLONE ACETONIDE 0.5 % EX OINT: 1.0000 "application " | TOPICAL_OINTMENT | Freq: Two times a day (BID) | CUTANEOUS | 1 refills | Status: DC

## 2018-06-14 NOTE — Patient Instructions (Signed)
It was good to see you again today.  I have sent in the triamcinolone cream for your legs.  Make sure to keep them elevated and be active as you have been.  We are checking some labs today.  Come back and see me in 6 months or sooner if you have any issues or concerns.

## 2018-06-14 NOTE — Progress Notes (Signed)
Subjective:    [name] is a 62 y.o. female who presents to [facility] today for diabetes check:  1.  Diabetes:  History of this in mid-2000s.  Never on insulin.  Resolved s/p gastric bypass.  Has never had trouble with CBGs since then.  Unfortunately, the label of "diabetic" has stuck to her in her chart, and she has been told she needs Q3 month A1Cs, yearly foot checks, urine microalbumin.  No polyuria or polydipsia.  Eating and drinking normally.  [facility].  Iron deficiency anemia: Ongoing issue.  She is taking oral liquid iron to help with constipation.  No bleeding.  No melena.  No hematochezia or abdominal pain.  She had no lightheadedness.  She does not have chest pain or shortness of breath with exertion.  3.  Rash: Bilateral legs.  Ongoing issue.  She is used triamcinolone in the past with relief of this.  The itching began along with a rash few days ago and she restart her triamcinolone.  She is asked for refill of this.  She does have some swelling around her ankles bilaterally.  Left shoulder surgery: She had repair of her rotator cuff on June 27.  I will add this to her history  ROS as above per HPI.   The following portions of the patient's history were reviewed and updated as appropriate: allergies, current medications, past medical history, family and social history, and problem list. Patient is a nonsmoker.    PMH reviewed.  Past Medical History:  Diagnosis Date  . Acute renal failure ([place]) 09/24/2016  . Anemia   . Arthritis    knees  . Back pain 10/04/2012  . Blood in stool 10/07/2014  . Blood transfusion without reported diagnosis   . Bunion 03/28/2014  . Cellulitis of leg, left 09/14/2016  . Chest pain 05/12/2015  . Constipation 10/19/2013   Chronic in the setting of known diverticulosis and history of bleeds.    . Cramping of hands 02/27/2016  . 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 Degenerative arthritis of right shoulder region 08/2013  . Diverticulosis   . Diverticulosis of colon with hemorrhage    Hx  Of recurrent Diverticular bleeding - several prior admissions   . Diverticulosis of colon without hemorrhage 11/18/2014   Noted on colonoscopy 11/2014   . GERD (gastroesophageal reflux disease)   . High cholesterol   . History of GI bleed   . Hypertension    under control with meds., has been on med. > 20 yr.  . Intractable vomiting 09/24/2016  . Lower GI bleed   . Medial meniscus tear 1997   Right Knee  . Mini stroke ([place])   . Muscle pain 05/26/2015  . Rotator cuff rupture, complete 08/2013   right  . Shoulder impingement 08/2013   right  . Stroke [facility]) 2006   Remote left lacunar infarct noted on CT head 2006, 2010   . Ulcer   . Wears dentures    upper  . Wears partial dentures    lower   Past Surgical History:  Procedure Laterality Date  . ABDOMINAL HYSTERECTOMY  ?1987   partial  . COLONOSCOPY  05/02/2000; 05/08/2013  . COSMETIC SURGERY  02/22/2014   skin removal surgery from lower abdomen   . ENDOVENOUS ABLATION SAPHENOUS VEIN W/ LASER Right 08/12/2016   endovenous laser ablation right greater saphenous  vein by Curt Jews MD   . ENDOVENOUS ABLATION SAPHENOUS VEIN W/ LASER Left 10/21/2016   EVLA L GSV by Curt Jews MD  . METATARSAL OSTEOTOMY Right 1995   Right Bunion Repair  . 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 SHOULDER ARTHROSCOPY WITH ROTATOR CUFF REPAIR AND SUBACROMIAL DECOMPRESSION Right 08/09/2013   Procedure: RIGHT SHOULDER ARTHROSCOPY WITH SUBACROMIAL DECOMPRESSION, DISTAL CLAVICLE EXCISION AND ROTATOR CUFF REPAIR and release biceps;  Surgeon: Ninetta Lights, MD;  Location: [place];  Service: Orthopedics;  Laterality: Right;  . SUBTOTAL COLECTOMY  11/24/15   [place] Left   . TOTAL KNEE ARTHROPLASTY Right     Medications reviewed. Current Outpatient Medications   Medication Sig Dispense Refill  . aspirin 81 MG tablet Take 81 mg by mouth daily.    Marland Kitchen atorvastatin (LIPITOR) 40 MG tablet TAKE ONE TABLET EACH DAY 90 tablet 1  . calcium carbonate (OS-CAL) 600 MG TABS tablet Take 1 tablet (600 mg total) by mouth 2 (two) times daily with a meal. 180 tablet 1  . CARAFATE 1 GM/10ML suspension TAKE 64ml (2 TEASPOONSFUL) BY MOUTH 4 TIMES DAILY WITH MEALS AND ATBEDTIME 420 mL 2  . cetirizine (ZYRTEC) 10 MG tablet Take 1 tablet (10 mg total) by mouth daily. (Patient taking differently: Take 10 mg by mouth daily as needed for allergies. ) 30 tablet 11  . cyclobenzaprine (FLEXERIL) 5 MG tablet Take 1 tablet (5 mg total) by mouth 3 (three) times daily as needed for muscle spasms. 90 tablet 0  . docusate sodium (DOK) 100 MG capsule TAKE ONE CAPSULE TWICE A DAY AS NEEDED FOR CONSTIPATION 180 capsule 1  . ferrous sulfate 220 (44 Fe) MG/5ML solution Take 5 mLs (220 mg total) by mouth 3 (three) times daily with meals. 473 mL 2  . fluticasone (FLONASE) 50 MCG/ACT nasal spray TWO SPRAYS IN EACH NOSTRIL AT BEDTIME 16 g 1  . furosemide (LASIX) 40 MG tablet Take 1 tablet (40 mg total) by mouth daily. 30 tablet 0  . gabapentin (NEURONTIN) 300 MG capsule One capsule with breakfast, one capsule with lunch and three capsules at bedtime 180 capsule 0  . hydrochlorothiazide (MICROZIDE) 12.5 MG capsule TAKE ONE CAPSULE EACH DAY 90 capsule 1  . lisinopril (PRINIVIL,ZESTRIL) 20 MG tablet Take 1 tablet (20 mg total) by mouth daily. 90 tablet 1  . Multiple Vitamin (MULTIVITAMIN) tablet Take 1 tablet by mouth daily. 90 tablet 1  . ondansetron (ZOFRAN) 4 MG tablet Take 1 tablet (4 mg total) by mouth every 6 (six) hours as needed for nausea. 10 tablet 0  . Oxycodone HCl 10 MG TABS Take 1 tablet (10 mg total) by mouth every 6 (six) hours as needed. 60 tablet 0  . pantoprazole (PROTONIX) 40 MG tablet TAKE ONE TABLET DAILY 90 tablet 1  . polyethylene glycol powder (GLYCOLAX/MIRALAX) powder Take 17  g by mouth 2 (two) times daily as needed. 3350 g 1  . SANTYL ointment Apply topically daily. To legs daily 30 g 2  . traMADol (ULTRAM) 50 MG tablet Take 1 tablet (50 mg total) by mouth every 8 (eight) hours as needed. 30 tablet 0  . triamcinolone ointment (KENALOG) 0.5 % Apply 1 application topically 2 (two) times daily. 30 g 0  . vitamin B-12 (CYANOCOBALAMIN) 1000 MCG tablet Take 1,000 mcg by mouth daily.    . vitamin C (ASCORBIC ACID) 250 MG tablet Take 250 mg by mouth daily.    Marland Kitchen  Vitamins A & D 5000-400 units CAPS Take 5,000 Units by mouth daily.    Marland Kitchen zolpidem (AMBIEN) 5 MG tablet TAKE ONE TABLET AT BEDTIME AS NEEDED FORSLEEP 30 tablet 2   No current facility-administered medications for this visit.      Objective:   Physical Exam There were no vitals taken for this visit. Gen:  Alert, cooperative patient who appears stated age in no acute distress.  Vital signs reviewed. HEENT: EOMI,  MMM Cardiac:  Regular rate and rhythm without murmur auscultated.  Good S1/S2. Pulm:  Clear to auscultation bilaterally with good air movement.  No wheezes or rales noted.   MSK: Left shoulder: able to abduct arm to about 90 degrees before she experiences any pain..  Exts: +1 edema bilateral shins.  She also has chronic venous stasis dermatitis beginning and hemosiderin staining noted BL  Impression/plan: 1.  Diabetes: -She does not actually have diabetes. -Her A1c has been less than 6 going back several years. -Resolved with gastric bypass. -This is not listed on her history but she does have multiple quality metrics.  I will remove these. -Discussed this and she was very happy about this.  2.  Iron deficiency anemia: -No symptoms currently. -Checking hemoglobin and iron level today if anemic.  3.  Bilateral lower venous stasis:-With stasis dermatitis. -Discussed with this means and the reason she is having hemosiderin changes and itching. -Triamcinolone for itching.  She will purchase another  pair of compression stockings when she has been out of for several months if not longer.  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 -Continue with activity.  Elevate legs as possible.  Compression stockings with prolonged standing.

## 2018-06-15 DIAGNOSIS — M75102 Unspecified rotator cuff tear or rupture of left shoulder, not specified as traumatic: Secondary | ICD-10-CM | POA: Diagnosis not present

## 2018-06-15 DIAGNOSIS — M25512 Pain in left shoulder: Secondary | ICD-10-CM | POA: Diagnosis not present

## 2018-06-15 DIAGNOSIS — M6281 Muscle weakness (generalized): Secondary | ICD-10-CM | POA: Diagnosis not present

## 2018-06-15 DIAGNOSIS — M25612 Stiffness of left shoulder, not elsewhere classified: Secondary | ICD-10-CM | POA: Diagnosis not present

## 2018-06-15 LAB — COMPREHENSIVE METABOLIC PANEL
ALT: 30 IU/L (ref 0–32)
AST: 32 IU/L (ref 0–40)
Albumin/Globulin Ratio: 1.5 (ref 1.2–2.2)
Albumin: 4.1 g/dL (ref 3.6–4.8)
Alkaline Phosphatase: 90 IU/L (ref 39–117)
BUN/Creatinine Ratio: 15 (ref 12–28)
BUN: 14 mg/dL (ref 8–27)
Bilirubin Total: 0.3 mg/dL (ref 0.0–1.2)
CO2: 25 mmol/L (ref 20–29)
Calcium: 9.3 mg/dL (ref 8.7–10.3)
Chloride: 105 mmol/L (ref 96–106)
Creatinine, Ser: 0.91 mg/dL (ref 0.57–1.00)
GFR calc Af Amer: 78 mL/min/{1.73_m2} (ref >59)
GFR calc non Af Amer: 68 mL/min/{1.73_m2} (ref >59)
Globulin, Total: 2.8 g/dL (ref 1.5–4.5)
Glucose: 68 mg/dL (ref 65–99)
Potassium: 3.6 mmol/L (ref 3.5–5.2)
Sodium: 143 mmol/L (ref 134–144)
Total Protein: 6.9 g/dL (ref 6.0–8.5)

## 2018-06-15 LAB — CBC
Hematocrit: 32.2 % — ABNORMAL LOW (ref 34.0–46.6)
Hemoglobin: 9.7 g/dL — ABNORMAL LOW (ref 11.1–15.9)
MCH: 24.6 pg — ABNORMAL LOW (ref 26.6–33.0)
MCHC: 30.1 g/dL — ABNORMAL LOW (ref 31.5–35.7)
MCV: 82 fL (ref 79–97)
Platelets: 276 10*3/uL (ref 150–450)
RBC: 3.94 x10E6/uL (ref 3.77–5.28)
RDW: 16.7 % — ABNORMAL HIGH (ref 12.3–15.4)
WBC: 5.8 10*3/uL (ref 3.4–10.8)

## 2018-06-15 LAB — LIPID PANEL
Chol/HDL Ratio: 1.6 ratio (ref 0.0–4.4)
Cholesterol, Total: 143 mg/dL (ref 100–199)
HDL: 89 mg/dL (ref >39)
LDL Calculated: 45 mg/dL (ref 0–99)
Triglycerides: 47 mg/dL (ref 0–149)
VLDL Cholesterol Cal: 9 mg/dL (ref 5–40)

## 2018-06-16 ENCOUNTER — Ambulatory Visit
Admission: RE | Admit: 2018-06-16 | Discharge: 2018-06-16 | Disposition: A | Discharge status: Home / Self Care | Payer: Medicare HMO | Source: Ambulatory Visit | Attending: Family Medicine | Admitting: Family Medicine

## 2018-06-16 DIAGNOSIS — Z0000 Encounter for general adult medical examination without abnormal findings: Secondary | ICD-10-CM

## 2018-06-16 DIAGNOSIS — Z1231 Encounter for screening mammogram for malignant neoplasm of breast: Secondary | ICD-10-CM | POA: Diagnosis not present

## 2018-06-20 ENCOUNTER — Encounter: Payer: Self-pay | Admitting: Family Medicine

## 2018-06-20 DIAGNOSIS — M25612 Stiffness of left shoulder, not elsewhere classified: Secondary | ICD-10-CM | POA: Diagnosis not present

## 2018-06-20 DIAGNOSIS — M75102 Unspecified rotator cuff tear or rupture of left shoulder, not specified as traumatic: Secondary | ICD-10-CM | POA: Diagnosis not present

## 2018-06-20 DIAGNOSIS — M25512 Pain in left shoulder: Secondary | ICD-10-CM | POA: Diagnosis not present

## 2018-06-20 DIAGNOSIS — M6281 Muscle weakness (generalized): Secondary | ICD-10-CM | POA: Diagnosis not present

## 2018-06-27 DIAGNOSIS — M25612 Stiffness of left shoulder, not elsewhere classified: Secondary | ICD-10-CM | POA: Diagnosis not present

## 2018-06-27 DIAGNOSIS — M6281 Muscle weakness (generalized): Secondary | ICD-10-CM | POA: Diagnosis not present

## 2018-06-27 DIAGNOSIS — M25512 Pain in left shoulder: Secondary | ICD-10-CM | POA: Diagnosis not present

## 2018-06-27 DIAGNOSIS — M75102 Unspecified rotator cuff tear or rupture of left shoulder, not specified as traumatic: Secondary | ICD-10-CM | POA: Diagnosis not present

## 2018-07-05 DIAGNOSIS — M6281 Muscle weakness (generalized): Secondary | ICD-10-CM | POA: Diagnosis not present

## 2018-07-05 DIAGNOSIS — M25512 Pain in left shoulder: Secondary | ICD-10-CM | POA: Diagnosis not present

## 2018-07-05 DIAGNOSIS — M75102 Unspecified rotator cuff tear or rupture of left shoulder, not specified as traumatic: Secondary | ICD-10-CM | POA: Diagnosis not present

## 2018-07-05 DIAGNOSIS — M25612 Stiffness of left shoulder, not elsewhere classified: Secondary | ICD-10-CM | POA: Diagnosis not present

## 2018-07-07 ENCOUNTER — Ambulatory Visit: Payer: Medicare HMO | Admitting: Podiatry

## 2018-07-24 DIAGNOSIS — M25512 Pain in left shoulder: Secondary | ICD-10-CM | POA: Diagnosis not present

## 2018-07-27 ENCOUNTER — Other Ambulatory Visit: Payer: Self-pay | Admitting: Family Medicine

## 2018-07-27 DIAGNOSIS — I872 Venous insufficiency (chronic) (peripheral): Secondary | ICD-10-CM

## 2018-07-27 DIAGNOSIS — M25612 Stiffness of left shoulder, not elsewhere classified: Secondary | ICD-10-CM | POA: Diagnosis not present

## 2018-07-27 DIAGNOSIS — M75102 Unspecified rotator cuff tear or rupture of left shoulder, not specified as traumatic: Secondary | ICD-10-CM | POA: Diagnosis not present

## 2018-07-27 DIAGNOSIS — M25512 Pain in left shoulder: Secondary | ICD-10-CM | POA: Diagnosis not present

## 2018-07-27 DIAGNOSIS — M6281 Muscle weakness (generalized): Secondary | ICD-10-CM | POA: Diagnosis not present

## 2018-08-24 ENCOUNTER — Other Ambulatory Visit: Payer: Self-pay

## 2018-08-24 DIAGNOSIS — R0683 Snoring: Secondary | ICD-10-CM

## 2018-08-24 DIAGNOSIS — E669 Obesity, unspecified: Secondary | ICD-10-CM | POA: Diagnosis not present

## 2018-08-24 DIAGNOSIS — K219 Gastro-esophageal reflux disease without esophagitis: Secondary | ICD-10-CM

## 2018-08-24 DIAGNOSIS — I10 Essential (primary) hypertension: Secondary | ICD-10-CM | POA: Diagnosis not present

## 2018-08-24 HISTORY — DX: Snoring: R06.83

## 2018-08-24 MED ORDER — SUCRALFATE 1 GM/10ML PO SUSP: ORAL | 2 refills | Status: DC

## 2018-09-26 ENCOUNTER — Other Ambulatory Visit: Payer: Self-pay

## 2018-09-26 DIAGNOSIS — I872 Venous insufficiency (chronic) (peripheral): Secondary | ICD-10-CM

## 2018-09-27 MED ORDER — ZOLPIDEM TARTRATE 5 MG PO TABS: ORAL_TABLET | ORAL | 0 refills | Status: DC

## 2018-09-27 MED ORDER — FUROSEMIDE 40 MG PO TABS: 40.0000 mg | ORAL_TABLET | Freq: Every day | ORAL | 2 refills | Status: DC

## 2018-10-17 ENCOUNTER — Telehealth: Payer: Self-pay | Admitting: *Deleted

## 2018-10-17 MED ORDER — ZOSTER VAC RECOMB ADJUVANTED 50 MCG/0.5ML IM SUSR: 0.5000 mL | Freq: Once | INTRAMUSCULAR | 0 refills | Status: AC

## 2018-10-17 NOTE — Telephone Encounter (Signed)
Pt would like a script for shingrix sent to [facility] on [address].  Will send to PCP. Fleeger, Salome Spotted, CMA

## 2018-10-17 NOTE — Telephone Encounter (Signed)
Done

## 2018-10-23 ENCOUNTER — Ambulatory Visit: Payer: Medicare HMO

## 2018-11-06 ENCOUNTER — Ambulatory Visit (INDEPENDENT_AMBULATORY_CARE_PROVIDER_SITE_OTHER): Payer: Medicare HMO | Admitting: *Deleted

## 2018-11-06 DIAGNOSIS — Z23 Encounter for immunization: Secondary | ICD-10-CM | POA: Diagnosis not present

## 2018-11-06 NOTE — Progress Notes (Signed)
Pt Is here for a flu shot.  Given and tolerated well. Fleeger, Salome Spotted, CMA

## 2018-11-20 ENCOUNTER — Other Ambulatory Visit: Payer: Self-pay | Admitting: Family Medicine

## 2018-12-12 ENCOUNTER — Other Ambulatory Visit: Payer: Self-pay

## 2018-12-12 ENCOUNTER — Ambulatory Visit (INDEPENDENT_AMBULATORY_CARE_PROVIDER_SITE_OTHER): Payer: Medicare HMO | Admitting: Family Medicine

## 2018-12-12 VITALS — BP 158/98 | HR 102 | Temp 100.1°F | Ht 64.0 in | Wt 211.4 lb

## 2018-12-12 DIAGNOSIS — R6889 Other general symptoms and signs: Secondary | ICD-10-CM

## 2018-12-12 DIAGNOSIS — R059 Cough, unspecified: Secondary | ICD-10-CM

## 2018-12-12 DIAGNOSIS — R05 Cough: Secondary | ICD-10-CM | POA: Diagnosis not present

## 2018-12-12 LAB — POCT INFLUENZA A/B
Influenza A, POC: NEGATIVE
Influenza B, POC: NEGATIVE

## 2018-12-12 MED ORDER — BENZONATATE 100 MG PO CAPS: 200.0000 mg | ORAL_CAPSULE | Freq: Three times a day (TID) | ORAL | 0 refills | Status: DC | PRN

## 2018-12-12 NOTE — Progress Notes (Signed)
Subjective:    [name] is a 63 y.o. female who presents to [facility] today for flu-like symptoms:  1.  Flu-like symptoms:  Patient with mild cough earlier this week.  Starting yesterday, had sudden onset of feeling feverish, chills, worsening non-productive cough, muscle and joint aches.  Earlier this week was taking Theraflu and Nyquil which were helping, but now are not starting yesterday.  No documented fever, but chills and having to stay wrapped in blanket with hat on while inside.  Cough acutely worsened yesterday as well.  Sick contacts are son, grandson, granddaughter who all live with her and who are sick with similar symptoms.  Works in [employer] and "all kids there are sick" according to patient.    No N/V.  Not hungry, but staying hydrated with water and ginger ale.  No dyspnea on exertion.  No chest pain.  No LE edema.     ROS as above per HPI.   The following portions of the patient's history were reviewed and updated as appropriate: allergies, current medications, past medical history, family and social history, and problem list. Patient is a nonsmoker.    PMH reviewed.  Past Medical History:  Diagnosis Date  . Acute renal failure ([place]) 09/24/2016  . Anemia   . Arthritis    knees  . Back pain 10/04/2012  . Blood in stool 10/07/2014  . Blood transfusion without reported diagnosis   . Bunion 03/28/2014  . Cellulitis of leg, left 09/14/2016  . Chest pain 05/12/2015  . Constipation 10/19/2013   Chronic in the setting of known diverticulosis and history of bleeds.    . Cramping of hands 02/27/2016  . 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 Degenerative arthritis of right shoulder region 08/2013  . Diverticulosis   . Diverticulosis of colon with hemorrhage    Hx  Of recurrent Diverticular bleeding - several prior admissions   . Diverticulosis of colon without hemorrhage 11/18/2014   Noted on colonoscopy 11/2014   . GERD  (gastroesophageal reflux disease)   . High cholesterol   . History of GI bleed   . Hypertension    under control with meds., has been on med. > 20 yr.  . Intractable vomiting 09/24/2016  . Lower GI bleed   . Medial meniscus tear 1997   Right Knee  . Mini stroke ([place])   . Muscle pain 05/26/2015  . Rotator cuff rupture, complete 08/2013   right  . Shoulder impingement 08/2013   right  . Stroke [facility]) 2006   Remote left lacunar infarct noted on CT head 2006, 2010   . Ulcer   . Wears dentures    upper  . Wears partial dentures    lower   Past Surgical History:  Procedure Laterality Date  . ABDOMINAL HYSTERECTOMY  ?1987   partial  . COLONOSCOPY  05/02/2000; 05/08/2013  . COSMETIC SURGERY  02/22/2014   skin removal surgery from lower abdomen   . ENDOVENOUS ABLATION SAPHENOUS VEIN W/ LASER Right 08/12/2016   endovenous laser ablation right greater saphenous vein by Curt Jews MD   . ENDOVENOUS ABLATION SAPHENOUS VEIN W/ LASER Left 10/21/2016   EVLA L GSV by Curt Jews MD  . METATARSAL OSTEOTOMY Right 1995   Right Bunion Repair  . ROTATOR CUFF REPAIR Left 04/27/2018  . 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 SHOULDER ARTHROSCOPY WITH ROTATOR CUFF REPAIR AND SUBACROMIAL DECOMPRESSION Right 08/09/2013  Procedure: RIGHT SHOULDER ARTHROSCOPY WITH SUBACROMIAL DECOMPRESSION, DISTAL CLAVICLE EXCISION AND ROTATOR CUFF REPAIR and release biceps;  Surgeon: Ninetta Lights, MD;  Location: [place];  Service: Orthopedics;  Laterality: Right;  . SUBTOTAL COLECTOMY  11/24/15   [place] Left   . TOTAL KNEE ARTHROPLASTY Right     Medications reviewed. Current Outpatient Medications  Medication Sig Dispense Refill  . aspirin 81 MG tablet Take 81 mg by mouth daily.    Marland Kitchen atorvastatin (LIPITOR) 40 MG tablet TAKE ONE TABLET EACH DAY 90 tablet 1  . calcium carbonate (OS-CAL) 600 MG TABS tablet Take 1 tablet (600 mg total) by mouth 2 (two) times daily  with a meal. 180 tablet 1  . cetirizine (ZYRTEC) 10 MG tablet Take 1 tablet (10 mg total) by mouth daily. (Patient taking differently: Take 10 mg by mouth daily as needed for allergies. ) 30 tablet 11  . docusate sodium (DOK) 100 MG capsule TAKE ONE CAPSULE TWICE A DAY AS NEEDED FOR CONSTIPATION 180 capsule 1  . ferrous sulfate 220 (44 Fe) MG/5ML solution Take 5 mLs (220 mg total) by mouth 3 (three) times daily with meals. 473 mL 2  . fluticasone (FLONASE) 50 MCG/ACT nasal spray TWO SPRAYS IN EACH NOSTRIL AT BEDTIME 16 g 1  . furosemide (LASIX) 40 MG tablet Take 1 tablet (40 mg total) by mouth daily. 30 tablet 2  . hydrochlorothiazide (MICROZIDE) 12.5 MG capsule TAKE ONE CAPSULE EACH DAY 90 capsule 1  . lisinopril (PRINIVIL,ZESTRIL) 20 MG tablet Take 1 tablet (20 mg total) by mouth daily. 90 tablet 1  . Multiple Vitamin (MULTIVITAMIN) tablet Take 1 tablet by mouth daily. 90 tablet 1  . pantoprazole (PROTONIX) 40 MG tablet TAKE ONE TABLET DAILY 90 tablet 1  . polyethylene glycol powder (GLYCOLAX/MIRALAX) powder Take 17 g by mouth 2 (two) times daily as needed. 3350 g 1  . SANTYL ointment Apply topically daily. To legs daily 30 g 2  . sucralfate (CARAFATE) 1 GM/10ML suspension TAKE 69ml (2 TEASPOONSFUL) BY MOUTH 4 TIMES DAILY WITH MEALS AND ATBEDTIME 420 mL 2  . traMADol (ULTRAM) 50 MG tablet Take 1 tablet (50 mg total) by mouth every 8 (eight) hours as needed. 30 tablet 0  . triamcinolone ointment (KENALOG) 0.5 % Apply 1 application topically 2 (two) times daily. 30 g 1  . vitamin B-12 (CYANOCOBALAMIN) 1000 MCG tablet Take 1,000 mcg by mouth daily.    . vitamin C (ASCORBIC ACID) 250 MG tablet Take 250 mg by mouth daily.    . Vitamins A & D 5000-400 units CAPS Take 5,000 Units by mouth daily.    Marland Kitchen zolpidem (AMBIEN) 5 MG tablet TAKE ONE TABLET AT BEDTIME AS NEEDED FOR SLEEP 30 tablet 1   No current facility-administered medications for this visit.      Objective:   Physical Exam BP (!) 158/98    Pulse (!) 102   Temp 100.1 F (37.8 C) (Oral)   Ht 5\' 4"  (1.626 m)   Wt 211 lb 6.4 oz (95.9 kg)   SpO2 98%   BMI 36.29 kg/m  Gen:  Patient sitting on exam table, appears stated age in no acute distress, however she does look ill and like she doesn't feel well.  Wrapped up in quilt, wearing knit cap, and scarf.   Head: Normocephalic atraumatic Eyes: EOMI, PERRL, sclera and conjunctiva non-erythematous Nose:  Nasal turbinates grossly enlarged bilaterally with noted clear drainage.   Mouth:  Mucosa membranes moist. Tonsils +2, nonenlarged, non-erythematous. Neck: No cervical lymphadenopathy noted Heart:  RRR, no murmurs auscultated. Pulm:  No wheezing noted.  Good air movement.   Abd:  Soft/nondistended/nontender.    Imp/Plan: 1.  Flu-like symptoms:  - with negative flu swab - Overall symptoms have lasted about 1 week, with sudden worsening yesterday.   - Cough and subjective fevers at home.  Elevated temp here -- last took APAP this AM.   - treating as viral illness, but I'm concerned for sudden onset of chills plus malaise in light of lingering URI symptoms for past week.  Sending for CXR today.   - Tessalon perles for cough as this is main symptom bothering her.  Continue APAP/Ibuprofen alternating for symptomatic relief.   - Will call patient with results of CXR.   - If positive, will treat.  If negative -- RTC Friday if no improvement.  Sooner if worsening.

## 2018-12-12 NOTE — Patient Instructions (Addendum)
Your flu test was negative.  This is good news.  However, I'm concerned with how quickly you started feeling poorly.    I would like to do a chest xray.  Go by [facility] to have a chest xray.  I will call you with these results.    In the meantime, we also need to get you feeling better.    Take the Tessalon perles up to three times a day for cough relief.    Keep alternating Tylenol and Ibuprofen for relief of your symptoms.    If your xray is negative, and you're no better on Friday, come back and let us see you again.  If at any point you start getting worse, don't wait and come back sooner.

## 2018-12-13 ENCOUNTER — Ambulatory Visit
Admission: RE | Admit: 2018-12-13 | Discharge: 2018-12-13 | Disposition: A | Discharge status: Home / Self Care | Payer: Medicare HMO | Source: Ambulatory Visit | Attending: Family Medicine | Admitting: Family Medicine

## 2018-12-13 ENCOUNTER — Telehealth: Payer: Self-pay | Admitting: Family Medicine

## 2018-12-13 ENCOUNTER — Ambulatory Visit: Payer: Medicare HMO | Admitting: Family Medicine

## 2018-12-13 DIAGNOSIS — R6889 Other general symptoms and signs: Secondary | ICD-10-CM

## 2018-12-13 DIAGNOSIS — R059 Cough, unspecified: Secondary | ICD-10-CM

## 2018-12-13 DIAGNOSIS — R05 Cough: Secondary | ICD-10-CM | POA: Diagnosis not present

## 2018-12-13 NOTE — Telephone Encounter (Signed)
Called and relayed normal CXR to patient.  She is feeling much better today than yesterday.  Able to go to work.  Appreciative of call

## 2018-12-15 ENCOUNTER — Telehealth: Payer: Self-pay | Admitting: *Deleted

## 2018-12-15 NOTE — Telephone Encounter (Signed)
Patient states that she is still coughing and it is worse at night.  When she coughs she feels like she cant catch her breath and that the tessalon Perles is no longer working.  She states that Dr. Mingo Amber told her to call if she was having this issue. [place], [name], CMA

## 2018-12-22 NOTE — Telephone Encounter (Signed)
Called and no answer.  Left message to call back.  If still coughing, should be re-examined.

## 2018-12-22 NOTE — Telephone Encounter (Signed)
Pt returned call.  Appt made for Monday pm. Garek Schuneman, Salome Spotted, [place]

## 2018-12-25 ENCOUNTER — Ambulatory Visit: Payer: Medicare HMO

## 2018-12-25 ENCOUNTER — Other Ambulatory Visit: Payer: Self-pay | Admitting: Family Medicine

## 2018-12-25 ENCOUNTER — Other Ambulatory Visit: Payer: Self-pay

## 2018-12-25 MED ORDER — HYDROCHLOROTHIAZIDE 12.5 MG PO CAPS: ORAL_CAPSULE | ORAL | 1 refills | Status: DC

## 2019-01-03 DIAGNOSIS — M25561 Pain in right knee: Secondary | ICD-10-CM | POA: Diagnosis not present

## 2019-01-03 DIAGNOSIS — M25562 Pain in left knee: Secondary | ICD-10-CM | POA: Diagnosis not present

## 2019-02-09 ENCOUNTER — Other Ambulatory Visit: Payer: Self-pay | Admitting: Family Medicine

## 2019-02-20 ENCOUNTER — Other Ambulatory Visit: Payer: Self-pay | Admitting: Family Medicine

## 2019-02-20 DIAGNOSIS — I872 Venous insufficiency (chronic) (peripheral): Secondary | ICD-10-CM

## 2019-03-12 ENCOUNTER — Other Ambulatory Visit: Payer: Self-pay

## 2019-03-12 ENCOUNTER — Telehealth: Payer: Self-pay | Admitting: *Deleted

## 2019-03-12 ENCOUNTER — Ambulatory Visit (INDEPENDENT_AMBULATORY_CARE_PROVIDER_SITE_OTHER): Payer: Medicare HMO | Admitting: Family Medicine

## 2019-03-12 ENCOUNTER — Telehealth: Payer: Self-pay | Admitting: Family Medicine

## 2019-03-12 VITALS — BP 128/78 | HR 92 | Ht 64.0 in

## 2019-03-12 DIAGNOSIS — N898 Other specified noninflammatory disorders of vagina: Secondary | ICD-10-CM | POA: Insufficient documentation

## 2019-03-12 DIAGNOSIS — N939 Abnormal uterine and vaginal bleeding, unspecified: Secondary | ICD-10-CM | POA: Diagnosis not present

## 2019-03-12 NOTE — Progress Notes (Signed)
Subjective:    Patient ID: [name], female    DOB: Oct 01, 1956, 63 y.o.   MRN: [id number]   CC: Vaginal bleeding  HPI: Patient is a 63 yo female with a past medical history significant for partial abdominal hysterectomy who presents today for vaginal bleeding. Patient reports this morning she had vaginal bleeding like she was having her menstrual cycle. She put on a pad and it soak through the pad. Patient also report some clots. This morning is the first time she has had vaginal bleeding in many years. She is postmenopausal and had a partial hysterectomy over 20 years ago. Last pap was many years ago as well. Patient reports some abdominal pain before bleeding but otherwise has been at her baseline since this morning. Patient does not take any blood thinner.  She denies any weight loss though she has had gastric bypass surgery. No night fevers, nausea, vomiting. She recently lost her fiancee.  Smoking status reviewed   ROS: all other systems were reviewed and are negative other than in the HPI   Past Medical History:  Diagnosis Date  . Acute renal failure ([place]) 09/24/2016  . Anemia   . Arthritis    knees  . Back pain 10/04/2012  . Blood in stool 10/07/2014  . Blood transfusion without reported diagnosis   . Bunion 03/28/2014  . Cellulitis of leg, left 09/14/2016  . Chest pain 05/12/2015  . Constipation 10/19/2013   Chronic in the setting of known diverticulosis and history of bleeds.    . Cramping of hands 02/27/2016  . 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 Degenerative arthritis of right shoulder region 08/2013  . Diverticulosis   . Diverticulosis of colon with hemorrhage    Hx  Of recurrent Diverticular bleeding - several prior admissions   . Diverticulosis of colon without hemorrhage 11/18/2014   Noted on colonoscopy 11/2014   . GERD (gastroesophageal reflux disease)   . High cholesterol   . History of GI bleed   .  Hypertension    under control with meds., has been on med. > 20 yr.  . Intractable vomiting 09/24/2016  . Lower GI bleed   . Medial meniscus tear 1997   Right Knee  . Mini stroke ([place])   . Muscle pain 05/26/2015  . Rotator cuff rupture, complete 08/2013   right  . Shoulder impingement 08/2013   right  . Stroke [facility]) 2006   Remote left lacunar infarct noted on CT head 2006, 2010   . Ulcer   . Wears dentures    upper  . Wears partial dentures    lower    Past Surgical History:  Procedure Laterality Date  . ABDOMINAL HYSTERECTOMY  ?1987   partial  . COLONOSCOPY  05/02/2000; 05/08/2013  . COSMETIC SURGERY  02/22/2014   skin removal surgery from lower abdomen   . ENDOVENOUS ABLATION SAPHENOUS VEIN W/ LASER Right 08/12/2016   endovenous laser ablation right greater saphenous vein by Curt Jews MD   . ENDOVENOUS ABLATION SAPHENOUS VEIN W/ LASER Left 10/21/2016   EVLA L GSV by Curt Jews MD  . METATARSAL OSTEOTOMY Right 1995   Right Bunion Repair  . ROTATOR CUFF REPAIR Left 04/27/2018  . 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 SHOULDER ARTHROSCOPY WITH ROTATOR CUFF REPAIR AND SUBACROMIAL DECOMPRESSION Right 08/09/2013   Procedure: RIGHT SHOULDER ARTHROSCOPY WITH SUBACROMIAL DECOMPRESSION, DISTAL CLAVICLE EXCISION AND ROTATOR CUFF REPAIR and  release biceps;  Surgeon: Ninetta Lights, MD;  Location: [place];  Service: Orthopedics;  Laterality: Right;  . SUBTOTAL COLECTOMY  11/24/15   [place] Left   . TOTAL KNEE ARTHROPLASTY Right     Past medical history, surgical, family, and social history reviewed and updated in the EMR as appropriate.  Objective:  BP 128/78   Pulse 92   Ht 5\' 4"  (1.626 m)   SpO2 99%   BMI 36.29 kg/m   Vitals and nursing note reviewed  General: NAD, pleasant, able to participate in exam Cardiac: RRR, normal heart sounds, no murmurs. 2+ radial and PT pulses bilaterally Respiratory: CTAB, normal effort, No  wheezes, rales or rhonchi Abdomen: soft, nontender, nondistended, no hepatic or splenomegaly, +BS Extremities: no edema or cyanosis. WWP. GU: Deferred Skin: warm and dry, no rashes noted Neuro: alert and oriented x4, no focal deficits Psych: Normal affect and mood   Assessment & Plan:   Vaginal bleeding Patient presents today with acute vaginal bleeding. Patient is post menopausal with partial hysterectomy many years ago. No red flags prior to bleeding episode this morning. Given age and status, concern for malignancy. Patient will need endometrial biopsy. She has been scheduled for next week GYN clinic. Patient will need follow visit with PCP to discuss results.   Marjie Skiff, MD [place] PGY-3

## 2019-03-12 NOTE — Assessment & Plan Note (Addendum)
Patient presents today with acute vaginal bleeding. Patient is post menopausal with partial hysterectomy many years ago. No red flags prior to bleeding episode this morning. Given age and status, concern for malignancy. Patient will need endometrial biopsy. She has been scheduled for next week GYN clinic. Patient will need follow visit with PCP to discuss results.

## 2019-03-12 NOTE — Telephone Encounter (Signed)
Bleeding should be self limiting ( ie should stop on its own). However, if she is still experiencing a lot bleeding in the next few days she can let us know. Will not send any medication at this time.  Thanks  Marjie Skiff, MD [place], PGY-3

## 2019-03-12 NOTE — Telephone Encounter (Signed)
**  After Hours/ Emergency Line Call**  Received a call to report that [name] started having vaginal bleeding this morning. She has bled through one small pad so far. She reports that today is the first time this has happened. Reports history of hysterectomy. Denying SOB, CP, dizziness or lightheadedness.  Recommended that she got to the ED if she experiences any of the above. She has had no fevers, cough, or known exposure to [place], and she was scheduled her for an office appt at 10:10am.  Red flags discussed.  Will forward to PCP.  [place] Godfrey Tritschler, DO PGY-2, [facility] 03/12/2019 7:41 AM

## 2019-03-12 NOTE — Telephone Encounter (Signed)
Pt calls because she thought MD was going to call in a medication to [employer].   Will forward to MD. Christen Bame, CMA

## 2019-03-13 NOTE — Telephone Encounter (Signed)
Pt informed, she will call back later this week if still having bleeding. Christen Bame, CMA

## 2019-03-22 ENCOUNTER — Ambulatory Visit (INDEPENDENT_AMBULATORY_CARE_PROVIDER_SITE_OTHER): Payer: Medicare HMO | Admitting: Family Medicine

## 2019-03-22 ENCOUNTER — Other Ambulatory Visit: Payer: Self-pay

## 2019-03-22 VITALS — BP 152/90 | Wt 215.0 lb

## 2019-03-22 DIAGNOSIS — N823 Fistula of vagina to large intestine: Secondary | ICD-10-CM

## 2019-03-22 NOTE — Patient Instructions (Signed)
Dear [name],   We are referring you to a colorectal surgeon for a finding of possible "rectovaginal fistula". This is treated with surgery.  - please refrain from sexual intercourse - please seek medical care if you pass large amounts of blood or have intense pain, fevers or any signs of infection.  - We are checking a hemoglobin today and will call you with any concerning results.   Thank you for coming in to be seen today. Take care  [facility]

## 2019-03-23 DIAGNOSIS — N823 Fistula of vagina to large intestine: Secondary | ICD-10-CM

## 2019-03-23 HISTORY — DX: Fistula of vagina to large intestine: N82.3

## 2019-03-23 LAB — CBC
Hematocrit: 30.5 % — ABNORMAL LOW (ref 34.0–46.6)
Hemoglobin: 9.6 g/dL — ABNORMAL LOW (ref 11.1–15.9)
MCH: 24.7 pg — ABNORMAL LOW (ref 26.6–33.0)
MCHC: 31.5 g/dL (ref 31.5–35.7)
MCV: 78 fL — ABNORMAL LOW (ref 79–97)
Platelets: 275 10*3/uL (ref 150–450)
RBC: 3.89 x10E6/uL (ref 3.77–5.28)
RDW: 16.6 % — ABNORMAL HIGH (ref 11.7–15.4)
WBC: 6.6 10*3/uL (ref 3.4–10.8)

## 2019-03-23 NOTE — Assessment & Plan Note (Signed)
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 The appearance of a "mass" in the distal vaulty could be a retained cervicix from supracervical hysterectomy. We do not have the operative records from that procedure (1987). We did review CT scan of abdomen/pelvis which verified no uterus and no adnexa were seen, but that does not rule out supracervical hysteretotmy. I think best next step would be to have her evaluated by general surgeon--hopefully by colorectal  Specialist.  We will do referral. If there is no rectovaginal fistula hen she will b\need evaluation by GYN for postmenopausal bleeding from suspected supracervical hysterectomy cervical remnant, although I think this dx is much less likely.  Will check hgb today

## 2019-03-23 NOTE — Progress Notes (Signed)
    CHIEF COMPLAINT / HPI:  Had episode of brownish discharge from her vagina several weeks ago. She has hx of hysterectomy. Sent to colposcopy clinic for further evaluation and management  Since that visit, she has not had a lot of discharge but has had daily scant brownish d/c. No abdominal or pelvic pain.   REVIEW OF SYSTEMS: No abdominal pain No constipation No unusual weight loss See HPI  PERTINENT  PMH / PSH: I have reviewed the patient's medications, allergies, past medical and surgical history, smoking status and updated in the EMR as appropriate. "partial" hysterectomy in 1987. CT scan of abdomen and pelvis on 07/2015:  report includes:"Diminutive bladder. Gaseous distension of the rectum. The uterus and adnexa appear to be surgically absent as before"  Colonoscopy done by r Deatra Ina 07/2015: ENDOSCOPIC IMPRESSION: 1. There was moderate diverticulosis noted in the ascending colon, at the cecum, in the transverse colon, sigmoid colon, and descending colon 2. The examination was otherwise normal Rectal bleeding secondary to diverticula  OBJECTIVE: GEN WD [place] female NAD GU: externally normal Speculum exam revel\als small amount  Of brown materilall in the area where vaginal cuff would typically be. On the patient's left side of the vaginal canal is a mass that is smooth---(this could be consistent with remaining cervix if she had a supracervical hysertectomy). On the right d]sid in similar position is a less well defined mass or bulge of mucosa. There is no way t tell where he brownish discharge/liquid is coming from and the amount is very scant. The discharge/liquid looks very much like stool.  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 The appearance of a "mass"  in the distal vaulty could be a retained cervicix from supracervical hysterectomy. We do not have the operative records from that procedure (1987). We did review CT scan of abdomen/pelvis which verified no uterus and no adnexa were seen, but that does not rule out supracervical hysteretotmy. I think best next step would be to have her evaluated by general surgeon--hopefully by colorectal  Specialist.  We will do referral. If there is no rectovaginal fistula hen she will b\need evaluation by GYN for postmenopausal bleeding from suspected supracervical hysterectomy cervical remnant, although I think this dx is much less likely.  Will check hgb today

## 2019-03-28 ENCOUNTER — Ambulatory Visit: Payer: Medicare HMO | Admitting: Family Medicine

## 2019-04-03 DIAGNOSIS — K5730 Diverticulosis of large intestine without perforation or abscess without bleeding: Secondary | ICD-10-CM | POA: Diagnosis not present

## 2019-04-10 ENCOUNTER — Ambulatory Visit (INDEPENDENT_AMBULATORY_CARE_PROVIDER_SITE_OTHER): Payer: Medicare HMO | Admitting: Family Medicine

## 2019-04-10 ENCOUNTER — Other Ambulatory Visit: Payer: Self-pay

## 2019-04-10 VITALS — BP 130/80 | HR 85 | Ht 64.0 in | Wt 214.2 lb

## 2019-04-10 DIAGNOSIS — E119 Type 2 diabetes mellitus without complications: Secondary | ICD-10-CM | POA: Diagnosis not present

## 2019-04-10 DIAGNOSIS — R252 Cramp and spasm: Secondary | ICD-10-CM | POA: Diagnosis not present

## 2019-04-10 DIAGNOSIS — H25012 Cortical age-related cataract, left eye: Secondary | ICD-10-CM | POA: Diagnosis not present

## 2019-04-10 DIAGNOSIS — M7989 Other specified soft tissue disorders: Secondary | ICD-10-CM | POA: Diagnosis not present

## 2019-04-10 NOTE — Assessment & Plan Note (Signed)
Per chart review this is a chronic intermittent problem for patient.  She is periodically had slightly low potassium.  Check BMP today.  Recommended good po hydration.

## 2019-04-10 NOTE — Patient Instructions (Signed)
We will check bloodwork.   Stay well hydrated.

## 2019-04-10 NOTE — Progress Notes (Signed)
    Subjective:  [name] is a 63 y.o. female who presents to the [facility] today with a chief complaint of leg cramping.   HPI:  Patient states that she has chronic leg swelling for which she wears compression stockings.  This is not worsened recently.  She has noticed some leg cramping recently in the setting of not drinking water like she should during this hot weather.  She states she has no chest pain, palpitations, shortness of breath, cough.  She has been compliant on her blood pressure medications.  She says that she is still taking her water pills which are making her urinate normally.  ROS: Per HPI  CC, SH/smoking status, and VS noted  Objective:  Physical Exam: BP 130/80   Pulse 85   Ht 5\' 4"  (1.626 m)   Wt 214 lb 4 oz (97.2 kg)   SpO2 99%   BMI 36.78 kg/m   Gen: NAD, resting comfortably CV: RRR with no murmurs appreciated Pulm: NWOB, CTAB with no crackles, wheezes, or rhonchi MSK: 1+ nonpitting edema bilaterally to mid shin.  No erythema or pain with palpation. Neg homans Skin: warm, dry Neuro: grossly normal, moves all extremities Psych: Normal affect and thought content   Assessment/Plan:  Leg cramping Per chart review this is a chronic intermittent problem for patient.  She is periodically had slightly low potassium.  Check BMP today.  Recommended good po hydration.    Orders Placed This Encounter  Procedures  . Basic metabolic panel    Bufford Lope, DO PGY-3, [facility] 04/10/2019 4:24 PM

## 2019-04-11 ENCOUNTER — Other Ambulatory Visit: Payer: Self-pay | Admitting: General Surgery

## 2019-04-11 DIAGNOSIS — K922 Gastrointestinal hemorrhage, unspecified: Secondary | ICD-10-CM

## 2019-04-11 DIAGNOSIS — K5730 Diverticulosis of large intestine without perforation or abscess without bleeding: Secondary | ICD-10-CM

## 2019-04-11 LAB — BASIC METABOLIC PANEL
BUN/Creatinine Ratio: 21 (ref 12–28)
BUN: 19 mg/dL (ref 8–27)
CO2: 25 mmol/L (ref 20–29)
Calcium: 9.4 mg/dL (ref 8.7–10.3)
Chloride: 105 mmol/L (ref 96–106)
Creatinine, Ser: 0.92 mg/dL (ref 0.57–1.00)
GFR calc Af Amer: 77 mL/min/{1.73_m2} (ref >59)
GFR calc non Af Amer: 66 mL/min/{1.73_m2} (ref >59)
Glucose: 96 mg/dL (ref 65–99)
Potassium: 3.8 mmol/L (ref 3.5–5.2)
Sodium: 145 mmol/L — ABNORMAL HIGH (ref 134–144)

## 2019-04-20 ENCOUNTER — Other Ambulatory Visit: Payer: Self-pay

## 2019-04-20 ENCOUNTER — Encounter: Payer: Self-pay | Admitting: Podiatry

## 2019-04-20 ENCOUNTER — Ambulatory Visit (INDEPENDENT_AMBULATORY_CARE_PROVIDER_SITE_OTHER): Payer: Medicare HMO

## 2019-04-20 ENCOUNTER — Ambulatory Visit (INDEPENDENT_AMBULATORY_CARE_PROVIDER_SITE_OTHER): Payer: Medicare HMO | Admitting: Podiatry

## 2019-04-20 VITALS — BP 148/92 | HR 77 | Temp 98.6°F | Resp 16

## 2019-04-20 DIAGNOSIS — M2142 Flat foot [pes planus] (acquired), left foot: Secondary | ICD-10-CM

## 2019-04-20 DIAGNOSIS — M19071 Primary osteoarthritis, right ankle and foot: Secondary | ICD-10-CM | POA: Diagnosis not present

## 2019-04-20 DIAGNOSIS — M2141 Flat foot [pes planus] (acquired), right foot: Secondary | ICD-10-CM

## 2019-04-20 DIAGNOSIS — M76821 Posterior tibial tendinitis, right leg: Secondary | ICD-10-CM

## 2019-04-20 DIAGNOSIS — M779 Enthesopathy, unspecified: Secondary | ICD-10-CM | POA: Diagnosis not present

## 2019-04-20 DIAGNOSIS — M25571 Pain in right ankle and joints of right foot: Secondary | ICD-10-CM

## 2019-04-20 DIAGNOSIS — R2681 Unsteadiness on feet: Secondary | ICD-10-CM

## 2019-04-20 DIAGNOSIS — M778 Other enthesopathies, not elsewhere classified: Secondary | ICD-10-CM

## 2019-04-27 DIAGNOSIS — Z20828 Contact with and (suspected) exposure to other viral communicable diseases: Secondary | ICD-10-CM | POA: Diagnosis not present

## 2019-04-28 ENCOUNTER — Emergency Department (HOSPITAL_COMMUNITY): Payer: Medicare HMO

## 2019-04-28 ENCOUNTER — Emergency Department (HOSPITAL_COMMUNITY)
Admission: EM | Admit: 2019-04-28 | Discharge: 2019-04-29 | Disposition: A | Discharge status: Home / Self Care | Payer: Medicare HMO | Attending: Emergency Medicine | Admitting: Emergency Medicine

## 2019-04-28 ENCOUNTER — Other Ambulatory Visit: Payer: Self-pay

## 2019-04-28 ENCOUNTER — Telehealth: Payer: Self-pay | Admitting: Family Medicine

## 2019-04-28 DIAGNOSIS — E876 Hypokalemia: Secondary | ICD-10-CM | POA: Diagnosis not present

## 2019-04-28 DIAGNOSIS — N189 Chronic kidney disease, unspecified: Secondary | ICD-10-CM | POA: Diagnosis not present

## 2019-04-28 DIAGNOSIS — R2241 Localized swelling, mass and lump, right lower limb: Secondary | ICD-10-CM | POA: Insufficient documentation

## 2019-04-28 DIAGNOSIS — Z79899 Other long term (current) drug therapy: Secondary | ICD-10-CM | POA: Diagnosis not present

## 2019-04-28 DIAGNOSIS — M25461 Effusion, right knee: Secondary | ICD-10-CM

## 2019-04-28 DIAGNOSIS — S8001XA Contusion of right knee, initial encounter: Secondary | ICD-10-CM | POA: Diagnosis not present

## 2019-04-28 DIAGNOSIS — M25561 Pain in right knee: Secondary | ICD-10-CM | POA: Diagnosis not present

## 2019-04-28 DIAGNOSIS — Z96651 Presence of right artificial knee joint: Secondary | ICD-10-CM | POA: Diagnosis not present

## 2019-04-28 DIAGNOSIS — M7989 Other specified soft tissue disorders: Secondary | ICD-10-CM | POA: Diagnosis not present

## 2019-04-28 DIAGNOSIS — Z96652 Presence of left artificial knee joint: Secondary | ICD-10-CM | POA: Insufficient documentation

## 2019-04-28 DIAGNOSIS — Y929 Unspecified place or not applicable: Secondary | ICD-10-CM | POA: Diagnosis not present

## 2019-04-28 DIAGNOSIS — X58XXXA Exposure to other specified factors, initial encounter: Secondary | ICD-10-CM | POA: Insufficient documentation

## 2019-04-28 DIAGNOSIS — Y939 Activity, unspecified: Secondary | ICD-10-CM | POA: Insufficient documentation

## 2019-04-28 DIAGNOSIS — Z8673 Personal history of transient ischemic attack (TIA), and cerebral infarction without residual deficits: Secondary | ICD-10-CM | POA: Insufficient documentation

## 2019-04-28 DIAGNOSIS — Z87891 Personal history of nicotine dependence: Secondary | ICD-10-CM | POA: Diagnosis not present

## 2019-04-28 DIAGNOSIS — I129 Hypertensive chronic kidney disease with stage 1 through stage 4 chronic kidney disease, or unspecified chronic kidney disease: Secondary | ICD-10-CM | POA: Insufficient documentation

## 2019-04-28 DIAGNOSIS — Y999 Unspecified external cause status: Secondary | ICD-10-CM | POA: Diagnosis not present

## 2019-04-28 LAB — COMPREHENSIVE METABOLIC PANEL
ALT: 24 U/L (ref 0–44)
AST: 28 U/L (ref 15–41)
Albumin: 3.6 g/dL (ref 3.5–5.0)
Alkaline Phosphatase: 74 U/L (ref 38–126)
Anion gap: 10 (ref 5–15)
BUN: 15 mg/dL (ref 8–23)
CO2: 25 mmol/L (ref 22–32)
Calcium: 9.2 mg/dL (ref 8.9–10.3)
Chloride: 106 mmol/L (ref 98–111)
Creatinine, Ser: 0.8 mg/dL (ref 0.44–1.00)
GFR calc Af Amer: >60 mL/min (ref >60)
GFR calc non Af Amer: >60 mL/min (ref >60)
Glucose, Bld: 74 mg/dL (ref 70–99)
Potassium: 3 mmol/L — ABNORMAL LOW (ref 3.5–5.1)
Sodium: 141 mmol/L (ref 135–145)
Total Bilirubin: 0.5 mg/dL (ref 0.3–1.2)
Total Protein: 7.1 g/dL (ref 6.5–8.1)

## 2019-04-28 LAB — CBC
HCT: 32.7 % — ABNORMAL LOW (ref 36.0–46.0)
Hemoglobin: 10 g/dL — ABNORMAL LOW (ref 12.0–15.0)
MCH: 25.8 pg — ABNORMAL LOW (ref 26.0–34.0)
MCHC: 30.6 g/dL (ref 30.0–36.0)
MCV: 84.5 fL (ref 80.0–100.0)
Platelets: 243 10*3/uL (ref 150–400)
RBC: 3.87 MIL/uL (ref 3.87–5.11)
RDW: 21 % — ABNORMAL HIGH (ref 11.5–15.5)
WBC: 7.6 10*3/uL (ref 4.0–10.5)
nRBC: 0 % (ref 0.0–0.2)

## 2019-04-28 MED ORDER — POTASSIUM CHLORIDE CRYS ER 20 MEQ PO TBCR
40.0000 meq | EXTENDED_RELEASE_TABLET | Freq: Once | ORAL | Status: AC
  Administered 2019-04-28: 40 meq via ORAL
  Filled 2019-04-28: qty 2

## 2019-04-28 MED ORDER — POTASSIUM CHLORIDE ER 20 MEQ PO TBCR: 40.0000 meq | EXTENDED_RELEASE_TABLET | Freq: Every day | ORAL | 0 refills | Status: AC

## 2019-04-28 MED ORDER — ACETAMINOPHEN 325 MG PO TABS
650.0000 mg | ORAL_TABLET | Freq: Once | ORAL | Status: AC
  Administered 2019-04-28: 650 mg via ORAL
  Filled 2019-04-28: qty 2

## 2019-04-28 MED ORDER — APIXABAN 5 MG PO TABS
10.0000 mg | ORAL_TABLET | Freq: Once | ORAL | Status: AC
  Administered 2019-04-29: 10 mg via ORAL
  Filled 2019-04-28: qty 2

## 2019-04-28 NOTE — ED Provider Notes (Signed)
[facility] EMERGENCY DEPARTMENT Provider Note   CSN: [id number] Arrival date & time: 04/28/19  2047    History   Chief Complaint Chief Complaint  Patient presents with  . Leg Swelling    HPI [name] is a 63 y.o. female with a past medical history of CKD, TIA, stroke, diverticulosis, who presents today for evaluation of right knee pain, right lower leg swelling and bruising over her right knee. She reports that she has had swelling since one week ago.  She called her PCP who told her she may have a blood clot and recommended that she come to the emergency room.  She also reports that she has a bruise on her knee that showed up today along with swelling behind the right knee.  She denies any fevers.  She denies any known trauma.  She does not have a history of DVT/blood clots.  No recent fevers or joint injections in the right knee.     HPI  Past Medical History:  Diagnosis Date  . Acute renal failure ([place]) 09/24/2016  . Anemia   . Arthritis    knees  . Back pain 10/04/2012  . Blood in stool 10/07/2014  . Blood transfusion without reported diagnosis   . Bunion 03/28/2014  . Cellulitis of leg, left 09/14/2016  . Chest pain 05/12/2015  . Constipation 10/19/2013   Chronic in the setting of known diverticulosis and history of bleeds.    . Cramping of hands 02/27/2016  . 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 Degenerative arthritis of right shoulder region 08/2013  . Diverticulosis   . Diverticulosis of colon with hemorrhage    Hx  Of recurrent Diverticular bleeding - several prior admissions   . Diverticulosis of colon without hemorrhage 11/18/2014   Noted on colonoscopy 11/2014   . GERD (gastroesophageal reflux disease)   . High cholesterol   . History of GI bleed   . Hypertension    under control with meds., has been on med. > 20 yr.  . Intractable vomiting 09/24/2016  . Lower GI bleed   . Medial  meniscus tear 1997   Right Knee  . Mini stroke ([place])   . Muscle pain 05/26/2015  . Rotator cuff rupture, complete 08/2013   right  . Shoulder impingement 08/2013   right  . Stroke [facility]) 2006   Remote left lacunar infarct noted on CT head 2006, 2010   . Ulcer   . Wears dentures    upper  . Wears partial dentures    lower    Patient Active Problem List   Diagnosis Date Noted  . 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 Vaginal discharge 03/12/2019  . Snoring 08/24/2018  . AC (acromioclavicular) joint arthritis 12/23/2017  . 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 Tingling 11/17/2017  . Peripheral neuropathy 05/20/2017  . Pain in joint, lower leg 04/19/2017  . Rash and nonspecific skin eruption 04/01/2017  . Esophageal dysmotility 02/14/2017  . Multiple open wounds of lower leg 11/12/2016  . Venous stasis ulcer of left ankle with fat layer exposed with varicose veins ([place]) 10/20/2016  . Healthcare maintenance 08/05/2016  . Skin lesion of right lower extremity 05/27/2016  . DDD (degenerative disc disease), lumbar 04/14/2016  . Cervical pain (neck) 03/26/2016  . Lumbar back pain 03/26/2016  . Osteoarthritis of both knees 03/12/2016  . Superficial thrombophlebitis 01/29/2016  . Premature supraventricular beats  12/25/2015  . Ileus following gastrointestinal surgery ([place]) 12/12/2015  . Small bowel obstruction ([place]) 12/12/2015  . Leg cramping 04/22/2014  . Biliary sludge 04/08/2014  . History of stroke 02/13/2014  . Hyperlipidemia 02/13/2014  . Subacromial bursitis 05/21/2013  . Eczema 05/21/2013  . Lipoma of lower extremity 03/07/2013  . External hemorrhoids without complication 58/85/0277  . Insomnia 08/29/2012  . GERD (gastroesophageal reflux disease) 08/24/2012  . Asthma, cough variant 03/10/2012  . Hypertension, benign essential, goal below 140/90 01/05/2012  . Diverticulosis of colon with hemorrhage   . Hypopigmented skin lesion 02/23/2011  . Allergic  rhinitis 05/27/2010  . CYST, KIDNEY, ACQUIRED 08/15/2007  . CEREBROVASCULAR ACCIDENT, HX OF 07/26/2007  . HTN, goal below 140/90 05/12/2007  . OBESITY, NOS 12/29/2006  . Iron deficiency anemia 12/29/2006  . Venous (peripheral) insufficiency 12/29/2006    Past Surgical History:  Procedure Laterality Date  . ABDOMINAL HYSTERECTOMY  ?1987   partial  . COLONOSCOPY  05/02/2000; 05/08/2013  . COSMETIC SURGERY  02/22/2014   skin removal surgery from lower abdomen   . ENDOVENOUS ABLATION SAPHENOUS VEIN W/ LASER Right 08/12/2016   endovenous laser ablation right greater saphenous vein by Curt Jews MD   . ENDOVENOUS ABLATION SAPHENOUS VEIN W/ LASER Left 10/21/2016   EVLA L GSV by Curt Jews MD  . METATARSAL OSTEOTOMY Right 1995   Right Bunion Repair  . ROTATOR CUFF REPAIR Left 04/27/2018  . 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 SHOULDER ARTHROSCOPY WITH ROTATOR CUFF REPAIR AND SUBACROMIAL DECOMPRESSION Right 08/09/2013   Procedure: RIGHT SHOULDER ARTHROSCOPY WITH SUBACROMIAL DECOMPRESSION, DISTAL CLAVICLE EXCISION AND ROTATOR CUFF REPAIR and release biceps;  Surgeon: Ninetta Lights, MD;  Location: [place];  Service: Orthopedics;  Laterality: Right;  . SUBTOTAL COLECTOMY  11/24/15   [place] Left   . TOTAL KNEE ARTHROPLASTY Right      OB History   No obstetric history on file.      Home Medications    Prior to Admission medications   Medication Sig Start Date End Date Taking? Authorizing Provider  aspirin 81 MG tablet Take 81 mg by mouth daily.    [provider]  atorvastatin (LIPITOR) 40 MG tablet TAKE ONE TABLET DAILY 12/25/18   Alveda Reasons, MD  benzonatate (TESSALON) 100 MG capsule Take 2 capsules (200 mg total) by mouth 3 (three) times daily as needed for cough. 12/12/18   Alveda Reasons, MD  calcium carbonate (OS-CAL) 600 MG TABS tablet Take 1 tablet (600 mg total) by mouth 2 (two) times daily with a meal. 01/07/14    Funches, Adriana Mccallum, MD  cetirizine (ZYRTEC) 10 MG tablet Take 1 tablet (10 mg total) by mouth daily. Patient taking differently: Take 10 mg by mouth daily as needed for allergies.  05/19/16   Mercy Riding, MD  docusate sodium (DOK) 100 MG capsule TAKE ONE CAPSULE TWICE A DAY AS NEEDED FOR CONSTIPATION 02/27/16   [name], MD  ferrous sulfate 220 (44 Fe) MG/5ML solution Take 5 mLs (220 mg total) by mouth 3 (three) times daily with meals. 11/23/17   Mercy Riding, MD  fluticasone (FLONASE) 50 MCG/ACT nasal spray TWO SPRAYS IN EACH NOSTRIL AT BEDTIME 02/13/19   Alveda Reasons, MD  furosemide (LASIX) 40 MG tablet TAKE ONE TABLET DAILY 02/20/19   Alveda Reasons, MD  hydrochlorothiazide (MICROZIDE) 12.5 MG capsule TAKE ONE CAPSULE EACH DAY 12/25/18   Alveda Reasons, MD  lisinopril (PRINIVIL,ZESTRIL) 20 MG tablet Take 1 tablet (20 mg total) by mouth daily. 07/01/17   Alveda Reasons, MD  Multiple Vitamin (MULTIVITAMIN) tablet Take 1 tablet by mouth daily. 01/07/14   Boykin Nearing, MD  pantoprazole (PROTONIX) 40 MG tablet TAKE ONE TABLET DAILY 12/25/18   Alveda Reasons, MD  polyethylene glycol powder (GLYCOLAX/MIRALAX) powder Take 17 g by mouth 2 (two) times daily as needed. 05/16/17   Lind Covert, MD  potassium chloride 20 MEQ TBCR Take 40 mEq by mouth daily for 5 days. 04/28/19 05/03/19  Lorin Glass, PA-C  SANTYL ointment Apply topically daily. To legs daily 07/01/17   Alveda Reasons, MD  sucralfate (CARAFATE) 1 GM/10ML suspension TAKE 45ml (2 TEASPOONSFUL) BY MOUTH 4 TIMES DAILY WITH MEALS AND ATBEDTIME 08/24/18   Alveda Reasons, MD  traMADol (ULTRAM) 50 MG tablet Take 1 tablet (50 mg total) by mouth every 8 (eight) hours as needed. 09/02/17   Mercy Riding, MD  triamcinolone ointment (KENALOG) 0.5 % Apply 1 application topically 2 (two) times daily. 06/14/18   Alveda Reasons, MD  vitamin B-12 (CYANOCOBALAMIN) 1000 MCG tablet Take 1,000 mcg by mouth daily.    [provider]  vitamin C (ASCORBIC ACID) 250 MG tablet Take 250 mg by mouth daily.    [provider]  Vitamins A & D 5000-400 units CAPS Take 5,000 Units by mouth daily. 03/16/16   [provider]  zolpidem (AMBIEN) 5 MG tablet TAKE ONE TABLET AT BEDTIME AS NEEDED FORSLEEP 02/20/19   Alveda Reasons, MD    Family History Family History  Problem Relation Age of Onset  . Cancer Father 59       Died from complications of colon CA  . Colon cancer Father        died in his 66s  . Diabetes Mother   . Hypertension Mother   . Diabetes Sister   . Heart disease Sister   . Diabetes Sister   . Stroke Sister   . Diabetes Brother   . Stroke Brother   . Diabetes Brother   . Diabetes Brother   . Diabetes Brother   . Hypertension Son     Social History Social History   Tobacco Use  . Smoking status: Former Smoker    Packs/day: 0.10    Years: 6.00    Pack years: 0.60    Quit date: 1976    Years since quitting: 44.5  . Smokeless tobacco: Never Used  Substance Use Topics  . Alcohol use: Yes    Alcohol/week: 0.0 standard drinks    Comment: occasional wine  . Drug use: No     Allergies   Aspirin, Nsaids, and Tolmetin   Review of Systems Review of Systems  Constitutional: Negative for chills and fever.  Respiratory: Negative for cough, choking, chest tightness and shortness of breath.   Cardiovascular: Positive for leg swelling. Negative for chest pain and palpitations.  Musculoskeletal: Negative for back pain, neck pain and neck stiffness.  Neurological: Negative for weakness, numbness and headaches.  All other systems reviewed and are negative.    Physical Exam Updated Vital Signs BP 135/74   Pulse 76   Temp 98.7 F (37.1 C) (Oral)   Resp 20   Ht 5\' 4"  (1.626 m)   Wt 95.3 kg   SpO2 100%   BMI 36.05 kg/m   Physical Exam Vitals signs and nursing note reviewed.  Constitutional:  General: She is not in acute distress.    Appearance: She is  well-developed. She is not diaphoretic.  HENT:     Head: Normocephalic and atraumatic.  Eyes:     General: No scleral icterus.       Right eye: No discharge.        Left eye: No discharge.     Conjunctiva/sclera: Conjunctivae normal.  Neck:     Musculoskeletal: Normal range of motion.  Cardiovascular:     Rate and Rhythm: Normal rate and regular rhythm.     Pulses: Normal pulses.     Heart sounds: Normal heart sounds.  Pulmonary:     Effort: Pulmonary effort is normal. No respiratory distress.     Breath sounds: No stridor.  Abdominal:     General: Abdomen is flat. There is no distension.     Tenderness: There is no abdominal tenderness.  Musculoskeletal:        General: No deformity.     Comments: There is trace pitting edema to bilateral lower extremities.  There is localized posterior swelling around the right knee with tenderness to palpation over the right calf.  She is able to bend her knee and has been ambulatory today.    Skin:    General: Skin is warm and dry.     Comments: There is a approximately 7 cm area of ecchymosis on the medial aspect of the right knee.  Neurological:     General: No focal deficit present.     Mental Status: She is alert and oriented to person, place, and time.     Sensory: No sensory deficit.     Motor: No abnormal muscle tone.  Psychiatric:        Mood and Affect: Mood normal.        Behavior: Behavior normal.      ED Treatments / Results  Labs (all labs ordered are listed, but only abnormal results are displayed) Labs Reviewed  CBC - Abnormal; Notable for the following components:      Result Value   Hemoglobin 10.0 (*)    HCT 32.7 (*)    MCH 25.8 (*)    RDW 21.0 (*)    All other components within normal limits  COMPREHENSIVE METABOLIC PANEL - Abnormal; Notable for the following components:   Potassium 3.0 (*)    All other components within normal limits    EKG None  Radiology Dg Chest 2 View  Result Date: 04/28/2019  CLINICAL DATA:  Leg swelling EXAM: CHEST - 2 VIEW COMPARISON:  12/13/2018 FINDINGS: Upper normal size of cardiac silhouette. Mediastinal contours and pulmonary vascularity normal. Lungs clear. No pulmonary infiltrate, pleural effusion or pneumothorax. Osseous structures unremarkable. IMPRESSION: No acute abnormalities. Electronically Signed   By: Lavonia Dana M.D.   On: 04/28/2019 23:03   Dg Knee Complete 4 Views Right  Result Date: 04/28/2019 CLINICAL DATA:  BILATERAL leg swelling for 1 week EXAM: RIGHT KNEE - COMPLETE 4+ VIEW COMPARISON:  None FINDINGS: Osseous demineralization. Components of a RIGHT knee prosthesis are identified. Moderate knee effusion present. No acute fracture, dislocation, bone destruction or periprosthetic lucency. IMPRESSION: RIGHT knee prosthesis with moderate knee joint effusion. No acute osseous abnormalities. Electronically Signed   By: Lavonia Dana M.D.   On: 04/28/2019 23:02    Procedures Procedures (including critical care time)  Medications Ordered in ED Medications  acetaminophen (TYLENOL) tablet 650 mg (650 mg Oral Given 04/28/19 2311)  potassium chloride SA (K-DUR) CR tablet 40  mEq (40 mEq Oral Given 04/28/19 2317)  apixaban (ELIQUIS) tablet 10 mg (10 mg Oral Given 04/29/19 0004)     Initial Impression / Assessment and Plan / ED Course  I have reviewed the triage vital signs and the nursing notes.  Pertinent labs & imaging results that were available during my care of the patient were reviewed by me and considered in my medical decision making (see chart for details).       Patient presents today for evaluation of right knee/leg pain and swelling.  On my exam she does not have obvious increased swelling in the right leg when compared to the left leg.  She does have a large bruise on her right knee.  X-rays of the knee were obtained, knee is prosthetic, and has a moderate joint effusion.  I suspect that she may have contused her knee without realizing it  causing her pain, swelling, and bruising.  Basic labs are obtained and reviewed, she has hypokalemia which was orally repleted.  She is given prescription for continued potassium at home for 5 days with instructions on PCP follow-up.  Chest x-ray does not show evidence of pulmonary vascular congestion.  She is not having chest pain or shortness of breath.  She and I had extensive discussion about anticoagulation and the risks and benefits of this in the setting of a possible DVT.  Discussed that she has another possible source of her pain which would be the effusion.  We discussed risks and benefits of both having a dose of anticoagulation and not having an anti-coagulation dose tonight until she returns for her DVT study tomorrow.  She was given enough information to make an informed decision in her medical care.  She elected for a dose of anticoagulant.  Take coagulation precautions were discussed.  She was seen as a shared visit with Dr. Ronnald Nian.   Return precautions were discussed with patient who states their understanding.  At the time of discharge patient denied any unaddressed complaints or concerns.  Patient is agreeable for discharge home.    Final Clinical Impressions(s) / ED Diagnoses   Final diagnoses:  Leg swelling  Knee effusion, right  Hypokalemia    ED Discharge Orders         Ordered    potassium chloride 20 MEQ TBCR  Daily     04/28/19 2330    [place] [facility]     04/28/19 2331           Lorin Glass, PA-C 04/29/19 0024    Lennice Sites, DO 04/29/19 2056

## 2019-04-28 NOTE — ED Triage Notes (Signed)
Pt complains of bilateral leg swelling x1 week. Pt reports right leg more swollen than left with bruising over right knee. She states right leg is tender to touch. Pt has been taking tylenol for pain and elevating leg.  VSS.

## 2019-04-28 NOTE — Discharge Instructions (Addendum)
Please take Tylenol (acetaminophen) to relieve your pain.  You may take tylenol, up to 1,000 mg (two extra strength pills).  Do not take more than 3,000 mg tylenol in a 24 hour period.  Please check all medication labels as many medications such as pain and cold medications may contain tylenol. Please do not drink alcohol while taking this medication.   Today we discussed the risks and benefits of blood thinner until you get your ultrasound and you elected to take the blood thinner.  As we discussed if you strike your head you need to return to the emergency room.  Any bleeding that you do have will take longer to stop.

## 2019-04-28 NOTE — ED Provider Notes (Signed)
Medical screening examination/treatment/procedure(s) were conducted as a shared visit with non-physician practitioner(s) and myself.  I personally evaluated the patient during the encounter. Briefly, the patient is a 63 y.o. female with history of constipation, diverticulosis, stroke who presents to the ED with right leg swelling.  Swelling has been for the last several days.  She has had surgery on both knees.  There is bruising to the right knee.  She does not remember any obvious trauma.  Patient has no signs to suggest infection.  She has good pulses.  She has some trace swelling to both legs.  Slightly more prominent to the right lower leg however appears more around the knee.  Likely patient with effusion.  Possibly from overactivity.  X-ray did not show any fracture of the knee but did show moderate joint effusion.  Which I suspect is reason for the leg swelling worse on that side.  X-ray showed no signs of volume overload.  No liver or kidney disease.  After shared decision with the patient she would like a dose of anticoagulation before coming back for DVT scan tomorrow when our ultrasound is available.  No concern for infectious process or septic joint.  Patient does not have specific knee tenderness.  She understands the risks and benefits of taking this dose.  Discharged in ED in good condition and told to return to ED if symptoms worsen.  This chart was dictated using voice recognition software.  Despite best efforts to proofread,  errors can occur which can change the documentation meaning.     EKG Interpretation None            Lennice Sites, DO 04/28/19 2343

## 2019-04-28 NOTE — Telephone Encounter (Signed)
Family medicine telephone note  Received after hours call to emergency line.  Patient states that she saw podiatrist on 04/20/2019 for foot pain.  States that she was diagnosed with arthritis that time.  Unfortunately that note is not available so it is difficult to say exactly what happened at that appointment.  Regardless patient states that she developed unilateral right lower extremity swelling a couple days after that appointment.  This is the swelling is been progressive and usually localized behind her right knee is characterized a dull ache.  She states that it started swelling so bad today she decided to wrap it with an Ace wrap.  When she took it off the swelling had not improved and in fact gotten a little worse and her entire right leg had a red color to it.  She states that the pain is perhaps a little bit worse than it was earlier today.  Patient has not had any prolonged periods of immobility, no trauma to the area that she is aware of.  Does not take any thrombogenic medications per her chart, does take an aspirin 81 mg daily.  Given patient's symptoms I recommended she go to the emergency department with the caveat that I cannot accurately assess her right leg over the phone.  Explained that her story is highly suspicious for a DVT and that this might need to be treated if she has it.  Patient voiced understanding stating that she will go to the emergency department tonight to be evaluated.  Guadalupe Dawn MD PGY-2 Family Medicine Resident

## 2019-04-29 ENCOUNTER — Ambulatory Visit (HOSPITAL_BASED_OUTPATIENT_CLINIC_OR_DEPARTMENT_OTHER)
Admission: RE | Admit: 2019-04-29 | Discharge: 2019-04-29 | Disposition: A | Discharge status: Home / Self Care | Payer: Medicare HMO | Source: Ambulatory Visit | Attending: Emergency Medicine | Admitting: Emergency Medicine

## 2019-04-29 DIAGNOSIS — M7989 Other specified soft tissue disorders: Secondary | ICD-10-CM | POA: Diagnosis not present

## 2019-04-29 DIAGNOSIS — M79609 Pain in unspecified limb: Secondary | ICD-10-CM

## 2019-04-29 NOTE — Progress Notes (Signed)
VASCULAR LAB PRELIMINARY  PRELIMINARY  PRELIMINARY  PRELIMINARY  Right lower extremity venous duplex completed.    Preliminary report:  See CV proc for preliminary results.  Vanda Waskey, RVT 04/29/2019, 12:02 PM

## 2019-04-30 NOTE — Progress Notes (Signed)
Subjective:  Patient ID: [name], female    DOB: 02/18/1956,  MRN: [id number]  Chief Complaint  Patient presents with  . Foot Pain    Dorsal/lateral midfoot and lateral ankle - aching, swelling x 2 weeks, no injury, tried Tylenol arthritis (helped some), worse at night    63 y.o. female presents with the above complaint. Hx as above.  Review of Systems: Negative except as noted in the HPI. Denies N/V/F/Ch.  Past Medical History:  Diagnosis Date  . Acute renal failure ([place]) 09/24/2016  . Anemia   . Arthritis    knees  . Back pain 10/04/2012  . Blood in stool 10/07/2014  . Blood transfusion without reported diagnosis   . Bunion 03/28/2014  . Cellulitis of leg, left 09/14/2016  . Chest pain 05/12/2015  . Constipation 10/19/2013   Chronic in the setting of known diverticulosis and history of bleeds.    . Cramping of hands 02/27/2016  . 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 Degenerative arthritis of right shoulder region 08/2013  . Diverticulosis   . Diverticulosis of colon with hemorrhage    Hx  Of recurrent Diverticular bleeding - several prior admissions   . Diverticulosis of colon without hemorrhage 11/18/2014   Noted on colonoscopy 11/2014   . GERD (gastroesophageal reflux disease)   . High cholesterol   . History of GI bleed   . Hypertension    under control with meds., has been on med. > 20 yr.  . Intractable vomiting 09/24/2016  . Lower GI bleed   . Medial meniscus tear 1997   Right Knee  . Mini stroke ([place])   . Muscle pain 05/26/2015  . Rotator cuff rupture, complete 08/2013   right  . Shoulder impingement 08/2013   right  . Stroke [facility]) 2006   Remote left lacunar infarct noted on CT head 2006, 2010   . Ulcer   . Wears dentures    upper  . Wears partial dentures    lower    Current Outpatient Medications:  .  aspirin 81 MG tablet, Take 81 mg by mouth daily., Disp: , Rfl:  .  atorvastatin (LIPITOR)  40 MG tablet, TAKE ONE TABLET DAILY, Disp: 90 tablet, Rfl: 1 .  benzonatate (TESSALON) 100 MG capsule, Take 2 capsules (200 mg total) by mouth 3 (three) times daily as needed for cough., Disp: 20 capsule, Rfl: 0 .  calcium carbonate (OS-CAL) 600 MG TABS tablet, Take 1 tablet (600 mg total) by mouth 2 (two) times daily with a meal., Disp: 180 tablet, Rfl: 1 .  cetirizine (ZYRTEC) 10 MG tablet, Take 1 tablet (10 mg total) by mouth daily. (Patient taking differently: Take 10 mg by mouth daily as needed for allergies. ), Disp: 30 tablet, Rfl: 11 .  docusate sodium (DOK) 100 MG capsule, TAKE ONE CAPSULE TWICE A DAY AS NEEDED FOR CONSTIPATION, Disp: 180 capsule, Rfl: 1 .  ferrous sulfate 220 (44 Fe) MG/5ML solution, Take 5 mLs (220 mg total) by mouth 3 (three) times daily with meals., Disp: 473 mL, Rfl: 2 .  fluticasone (FLONASE) 50 MCG/ACT nasal spray, TWO SPRAYS IN EACH NOSTRIL AT BEDTIME, Disp: 16 g, Rfl: 1 .  furosemide (LASIX) 40 MG tablet, TAKE ONE TABLET DAILY, Disp: 30 tablet, Rfl: 2 .  hydrochlorothiazide (MICROZIDE) 12.5 MG capsule, TAKE ONE CAPSULE EACH DAY, Disp: 90 capsule, Rfl: 1 .  lisinopril (PRINIVIL,ZESTRIL) 20 MG tablet, Take 1  tablet (20 mg total) by mouth daily., Disp: 90 tablet, Rfl: 1 .  Multiple Vitamin (MULTIVITAMIN) tablet, Take 1 tablet by mouth daily., Disp: 90 tablet, Rfl: 1 .  pantoprazole (PROTONIX) 40 MG tablet, TAKE ONE TABLET DAILY, Disp: 90 tablet, Rfl: 1 .  polyethylene glycol powder (GLYCOLAX/MIRALAX) powder, Take 17 g by mouth 2 (two) times daily as needed., Disp: 3350 g, Rfl: 1 .  potassium chloride 20 MEQ TBCR, Take 40 mEq by mouth daily for 5 days., Disp: 10 tablet, Rfl: 0 .  SANTYL ointment, Apply topically daily. To legs daily, Disp: 30 g, Rfl: 2 .  sucralfate (CARAFATE) 1 GM/10ML suspension, TAKE 67ml (2 TEASPOONSFUL) BY MOUTH 4 TIMES DAILY WITH MEALS AND ATBEDTIME, Disp: 420 mL, Rfl: 2 .  traMADol (ULTRAM) 50 MG tablet, Take 1 tablet (50 mg total) by mouth every  8 (eight) hours as needed., Disp: 30 tablet, Rfl: 0 .  triamcinolone ointment (KENALOG) 0.5 %, Apply 1 application topically 2 (two) times daily., Disp: 30 g, Rfl: 1 .  vitamin B-12 (CYANOCOBALAMIN) 1000 MCG tablet, Take 1,000 mcg by mouth daily., Disp: , Rfl:  .  vitamin C (ASCORBIC ACID) 250 MG tablet, Take 250 mg by mouth daily., Disp: , Rfl:  .  Vitamins A & D 5000-400 units CAPS, Take 5,000 Units by mouth daily., Disp: , Rfl:  .  zolpidem (AMBIEN) 5 MG tablet, TAKE ONE TABLET AT BEDTIME AS NEEDED FORSLEEP, Disp: 30 tablet, Rfl: 1  Social History   Tobacco Use  Smoking Status Former Smoker  . Packs/day: 0.10  . Years: 6.00  . Pack years: 0.60  . Quit date: 49  . Years since quitting: 44.5  Smokeless Tobacco Never Used    Allergies  Allergen Reactions  . Aspirin Other (See Comments)    CAN NOT TAKE DUE TO GI BLEED GI BLEED  . Nsaids Other (See Comments)    GI BLEED  . Tolmetin Other (See Comments)    GI BLEED   Objective:   Vitals:   04/20/19 1412  BP: (!) 148/92  Pulse: 77  Resp: 16  Temp: 98.6 F (37 C)   There is no height or weight on file to calculate BMI. Constitutional Well developed. Well nourished.  Vascular Dorsalis pedis pulses palpable bilaterally. Posterior tibial pulses palpable bilaterally. Capillary refill normal to all digits.  No cyanosis or clubbing noted. Pedal hair growth normal.  Neurologic Normal speech. Oriented to person, place, and time. Epicritic sensation to light touch grossly present bilaterally.  Dermatologic Nails well groomed and normal in appearance. No open wounds. No skin lesions.  Orthopedic: POP R sinus tarsi, POP dorsal TMTs, POP R PT tendon   Radiographs: Taken and reviewed subtalar sclerosis pes planus no acute fractures. Assessment:   1. Sinus tarsi syndrome of right ankle   2. Arthrosis of midfoot, right   3. Gait instability   4. Pes planus of both feet   5. Posterior tibial tendon dysfunction (PTTD) of  right lower extremity    Plan:  Patient was evaluated and treated and all questions answered.  Pes planus with sinus tarsitis, Right; midfoot OA. -X-rays reviewed as above -Injection delivered to the sinus tarsi as below, dorsal midfoot. -Dispensed Tri-Lock ankle brace.  Patient educated on use  Procedure: Injection Intermediate Joint Consent: Verbal consent obtained. Location: Right sinus tarsi. Skin Prep: alcohol. Injectate: 1 cc 0.5% marcaine plain, 1 cc dexamethasone phosphate, 0.5 cc kenalog 10. Disposition: Patient tolerated procedure well. Injection site dressed with a  band-aid.  Procedure: Joint Injection Location: Right dorsal 2nd TMT joint Skin Prep: Alcohol. Injectate: 0.5 cc 1% lidocaine plain, 0.5 cc dexamethasone phosphate. Disposition: Patient tolerated procedure well. Injection site dressed with a band-aid.    Return in about 3 weeks (around 05/11/2019) for Right Sinus tarsitis, midfoot arthritis.

## 2019-05-02 DIAGNOSIS — M25561 Pain in right knee: Secondary | ICD-10-CM | POA: Diagnosis not present

## 2019-05-03 ENCOUNTER — Other Ambulatory Visit: Payer: Self-pay

## 2019-05-03 MED ORDER — PANTOPRAZOLE SODIUM 40 MG PO TBEC: 40.0000 mg | DELAYED_RELEASE_TABLET | Freq: Every day | ORAL | 1 refills | Status: DC

## 2019-05-07 ENCOUNTER — Other Ambulatory Visit: Payer: Medicare HMO

## 2019-05-09 ENCOUNTER — Ambulatory Visit: Payer: Medicare HMO

## 2019-05-11 ENCOUNTER — Other Ambulatory Visit: Payer: Self-pay

## 2019-05-11 ENCOUNTER — Ambulatory Visit (INDEPENDENT_AMBULATORY_CARE_PROVIDER_SITE_OTHER): Payer: Medicare HMO | Admitting: Podiatry

## 2019-05-11 DIAGNOSIS — M25561 Pain in right knee: Secondary | ICD-10-CM | POA: Diagnosis not present

## 2019-05-11 DIAGNOSIS — M19071 Primary osteoarthritis, right ankle and foot: Secondary | ICD-10-CM

## 2019-05-11 DIAGNOSIS — M25571 Pain in right ankle and joints of right foot: Secondary | ICD-10-CM

## 2019-05-17 ENCOUNTER — Encounter (HOSPITAL_COMMUNITY): Payer: Self-pay | Admitting: Emergency Medicine

## 2019-05-17 ENCOUNTER — Ambulatory Visit (INDEPENDENT_AMBULATORY_CARE_PROVIDER_SITE_OTHER)
Admission: EM | Admit: 2019-05-17 | Discharge: 2019-05-17 | Disposition: A | Discharge status: Home / Self Care | Payer: Medicare HMO | Source: Home / Self Care | Attending: Internal Medicine | Admitting: Internal Medicine

## 2019-05-17 ENCOUNTER — Encounter (HOSPITAL_COMMUNITY)
Admission: EM | Disposition: A | Discharge status: Home / Self Care | Payer: Self-pay | Source: Home / Self Care | Attending: Family Medicine

## 2019-05-17 ENCOUNTER — Ambulatory Visit: Payer: Medicare HMO | Admitting: Family Medicine

## 2019-05-17 ENCOUNTER — Inpatient Hospital Stay (HOSPITAL_COMMUNITY)
Admission: EM | Admit: 2019-05-17 | Discharge: 2019-05-20 | DRG: 378 | Disposition: A | Discharge status: Home / Self Care | Payer: Medicare HMO | Attending: Family Medicine | Admitting: Family Medicine

## 2019-05-17 ENCOUNTER — Other Ambulatory Visit: Payer: Self-pay

## 2019-05-17 ENCOUNTER — Encounter: Payer: Self-pay | Admitting: Family Medicine

## 2019-05-17 ENCOUNTER — Telehealth: Payer: Self-pay | Admitting: *Deleted

## 2019-05-17 DIAGNOSIS — M19011 Primary osteoarthritis, right shoulder: Secondary | ICD-10-CM | POA: Diagnosis not present

## 2019-05-17 DIAGNOSIS — Z8249 Family history of ischemic heart disease and other diseases of the circulatory system: Secondary | ICD-10-CM | POA: Diagnosis not present

## 2019-05-17 DIAGNOSIS — Z862 Personal history of diseases of the blood and blood-forming organs and certain disorders involving the immune mechanism: Secondary | ICD-10-CM | POA: Diagnosis present

## 2019-05-17 DIAGNOSIS — K219 Gastro-esophageal reflux disease without esophagitis: Secondary | ICD-10-CM | POA: Diagnosis not present

## 2019-05-17 DIAGNOSIS — E785 Hyperlipidemia, unspecified: Secondary | ICD-10-CM | POA: Diagnosis present

## 2019-05-17 DIAGNOSIS — Z90710 Acquired absence of both cervix and uterus: Secondary | ICD-10-CM | POA: Diagnosis not present

## 2019-05-17 DIAGNOSIS — I10 Essential (primary) hypertension: Secondary | ICD-10-CM | POA: Diagnosis present

## 2019-05-17 DIAGNOSIS — K289 Gastrojejunal ulcer, unspecified as acute or chronic, without hemorrhage or perforation: Secondary | ICD-10-CM | POA: Diagnosis not present

## 2019-05-17 DIAGNOSIS — Z20828 Contact with and (suspected) exposure to other viral communicable diseases: Secondary | ICD-10-CM | POA: Diagnosis not present

## 2019-05-17 DIAGNOSIS — K922 Gastrointestinal hemorrhage, unspecified: Secondary | ICD-10-CM

## 2019-05-17 DIAGNOSIS — Z9049 Acquired absence of other specified parts of digestive tract: Secondary | ICD-10-CM

## 2019-05-17 DIAGNOSIS — Z7982 Long term (current) use of aspirin: Secondary | ICD-10-CM

## 2019-05-17 DIAGNOSIS — N939 Abnormal uterine and vaginal bleeding, unspecified: Secondary | ICD-10-CM | POA: Diagnosis present

## 2019-05-17 DIAGNOSIS — R4020 Unspecified coma: Secondary | ICD-10-CM | POA: Diagnosis not present

## 2019-05-17 DIAGNOSIS — Z79891 Long term (current) use of opiate analgesic: Secondary | ICD-10-CM | POA: Diagnosis not present

## 2019-05-17 DIAGNOSIS — Z886 Allergy status to analgesic agent status: Secondary | ICD-10-CM

## 2019-05-17 DIAGNOSIS — I9589 Other hypotension: Secondary | ICD-10-CM

## 2019-05-17 DIAGNOSIS — Z79899 Other long term (current) drug therapy: Secondary | ICD-10-CM

## 2019-05-17 DIAGNOSIS — Z96653 Presence of artificial knee joint, bilateral: Secondary | ICD-10-CM | POA: Diagnosis present

## 2019-05-17 DIAGNOSIS — D62 Acute posthemorrhagic anemia: Secondary | ICD-10-CM | POA: Diagnosis present

## 2019-05-17 DIAGNOSIS — E876 Hypokalemia: Secondary | ICD-10-CM | POA: Diagnosis present

## 2019-05-17 DIAGNOSIS — Z03818 Encounter for observation for suspected exposure to other biological agents ruled out: Secondary | ICD-10-CM | POA: Diagnosis not present

## 2019-05-17 DIAGNOSIS — Z9884 Bariatric surgery status: Secondary | ICD-10-CM | POA: Diagnosis not present

## 2019-05-17 DIAGNOSIS — D500 Iron deficiency anemia secondary to blood loss (chronic): Secondary | ICD-10-CM | POA: Diagnosis not present

## 2019-05-17 DIAGNOSIS — Z7951 Long term (current) use of inhaled steroids: Secondary | ICD-10-CM | POA: Diagnosis not present

## 2019-05-17 DIAGNOSIS — I872 Venous insufficiency (chronic) (peripheral): Secondary | ICD-10-CM | POA: Diagnosis not present

## 2019-05-17 DIAGNOSIS — Z87891 Personal history of nicotine dependence: Secondary | ICD-10-CM

## 2019-05-17 DIAGNOSIS — K284 Chronic or unspecified gastrojejunal ulcer with hemorrhage: Secondary | ICD-10-CM | POA: Diagnosis not present

## 2019-05-17 DIAGNOSIS — K625 Hemorrhage of anus and rectum: Secondary | ICD-10-CM

## 2019-05-17 DIAGNOSIS — E611 Iron deficiency: Secondary | ICD-10-CM | POA: Diagnosis present

## 2019-05-17 DIAGNOSIS — E1142 Type 2 diabetes mellitus with diabetic polyneuropathy: Secondary | ICD-10-CM | POA: Diagnosis present

## 2019-05-17 DIAGNOSIS — Z8673 Personal history of transient ischemic attack (TIA), and cerebral infarction without residual deficits: Secondary | ICD-10-CM | POA: Diagnosis not present

## 2019-05-17 DIAGNOSIS — Z823 Family history of stroke: Secondary | ICD-10-CM

## 2019-05-17 DIAGNOSIS — R58 Hemorrhage, not elsewhere classified: Secondary | ICD-10-CM | POA: Diagnosis not present

## 2019-05-17 DIAGNOSIS — Z8719 Personal history of other diseases of the digestive system: Secondary | ICD-10-CM | POA: Diagnosis not present

## 2019-05-17 DIAGNOSIS — R571 Hypovolemic shock: Secondary | ICD-10-CM

## 2019-05-17 DIAGNOSIS — G4700 Insomnia, unspecified: Secondary | ICD-10-CM | POA: Diagnosis present

## 2019-05-17 DIAGNOSIS — Z800 Family history of malignant neoplasm of digestive organs: Secondary | ICD-10-CM

## 2019-05-17 DIAGNOSIS — K5731 Diverticulosis of large intestine without perforation or abscess with bleeding: Secondary | ICD-10-CM | POA: Insufficient documentation

## 2019-05-17 DIAGNOSIS — R404 Transient alteration of awareness: Secondary | ICD-10-CM | POA: Diagnosis not present

## 2019-05-17 DIAGNOSIS — D509 Iron deficiency anemia, unspecified: Secondary | ICD-10-CM

## 2019-05-17 DIAGNOSIS — I959 Hypotension, unspecified: Secondary | ICD-10-CM | POA: Diagnosis not present

## 2019-05-17 DIAGNOSIS — Z86010 Personal history of colonic polyps: Secondary | ICD-10-CM

## 2019-05-17 DIAGNOSIS — I951 Orthostatic hypotension: Secondary | ICD-10-CM | POA: Diagnosis present

## 2019-05-17 DIAGNOSIS — R55 Syncope and collapse: Secondary | ICD-10-CM | POA: Diagnosis not present

## 2019-05-17 DIAGNOSIS — E860 Dehydration: Secondary | ICD-10-CM | POA: Diagnosis present

## 2019-05-17 DIAGNOSIS — Z888 Allergy status to other drugs, medicaments and biological substances status: Secondary | ICD-10-CM

## 2019-05-17 HISTORY — PX: ESOPHAGOGASTRODUODENOSCOPY: SHX5428

## 2019-05-17 HISTORY — PX: SCLEROTHERAPY: SHX6841

## 2019-05-17 HISTORY — DX: Abnormal uterine and vaginal bleeding, unspecified: N93.9

## 2019-05-17 HISTORY — PX: HEMOSTASIS CLIP PLACEMENT: SHX6857

## 2019-05-17 LAB — COMPREHENSIVE METABOLIC PANEL
ALT: 19 U/L (ref 0–44)
AST: 22 U/L (ref 15–41)
Albumin: 3.1 g/dL — ABNORMAL LOW (ref 3.5–5.0)
Alkaline Phosphatase: 52 U/L (ref 38–126)
Anion gap: 11 (ref 5–15)
BUN: 30 mg/dL — ABNORMAL HIGH (ref 8–23)
CO2: 20 mmol/L — ABNORMAL LOW (ref 22–32)
Calcium: 8.8 mg/dL — ABNORMAL LOW (ref 8.9–10.3)
Chloride: 110 mmol/L (ref 98–111)
Creatinine, Ser: 0.93 mg/dL (ref 0.44–1.00)
GFR calc Af Amer: >60 mL/min (ref >60)
GFR calc non Af Amer: >60 mL/min (ref >60)
Glucose, Bld: 157 mg/dL — ABNORMAL HIGH (ref 70–99)
Potassium: 3.6 mmol/L (ref 3.5–5.1)
Sodium: 141 mmol/L (ref 135–145)
Total Bilirubin: 0.4 mg/dL (ref 0.3–1.2)
Total Protein: 5.8 g/dL — ABNORMAL LOW (ref 6.5–8.1)

## 2019-05-17 LAB — CBC WITH DIFFERENTIAL/PLATELET
Abs Immature Granulocytes: 0.05 10*3/uL (ref 0.00–0.07)
Basophils Absolute: 0 10*3/uL (ref 0.0–0.1)
Basophils Relative: 0 %
Eosinophils Absolute: 0 10*3/uL (ref 0.0–0.5)
Eosinophils Relative: 0 %
HCT: 25.5 % — ABNORMAL LOW (ref 36.0–46.0)
Hemoglobin: 7.6 g/dL — ABNORMAL LOW (ref 12.0–15.0)
Immature Granulocytes: 1 %
Lymphocytes Relative: 15 %
Lymphs Abs: 1.3 10*3/uL (ref 0.7–4.0)
MCH: 26.7 pg (ref 26.0–34.0)
MCHC: 29.8 g/dL — ABNORMAL LOW (ref 30.0–36.0)
MCV: 89.5 fL (ref 80.0–100.0)
Monocytes Absolute: 0.4 10*3/uL (ref 0.1–1.0)
Monocytes Relative: 4 %
Neutro Abs: 7 10*3/uL (ref 1.7–7.7)
Neutrophils Relative %: 80 %
Platelets: 229 10*3/uL (ref 150–400)
RBC: 2.85 MIL/uL — ABNORMAL LOW (ref 3.87–5.11)
RDW: 20.8 % — ABNORMAL HIGH (ref 11.5–15.5)
WBC: 8.8 10*3/uL (ref 4.0–10.5)
nRBC: 0 % (ref 0.0–0.2)

## 2019-05-17 LAB — CBC
HCT: 28.6 % — ABNORMAL LOW (ref 36.0–46.0)
Hemoglobin: 9.1 g/dL — ABNORMAL LOW (ref 12.0–15.0)
MCH: 28.1 pg (ref 26.0–34.0)
MCHC: 31.8 g/dL (ref 30.0–36.0)
MCV: 88.3 fL (ref 80.0–100.0)
Platelets: 213 10*3/uL (ref 150–400)
RBC: 3.24 MIL/uL — ABNORMAL LOW (ref 3.87–5.11)
RDW: 19.2 % — ABNORMAL HIGH (ref 11.5–15.5)
WBC: 9.8 10*3/uL (ref 4.0–10.5)
nRBC: 0 % (ref 0.0–0.2)

## 2019-05-17 LAB — POC OCCULT BLOOD, ED: Fecal Occult Bld: POSITIVE — AB

## 2019-05-17 LAB — PREPARE RBC (CROSSMATCH)

## 2019-05-17 LAB — SARS CORONAVIRUS 2 BY RT PCR (HOSPITAL ORDER, PERFORMED IN ~~LOC~~ HOSPITAL LAB): SARS Coronavirus 2: NEGATIVE

## 2019-05-17 SURGERY — SIGMOIDOSCOPY, FLEXIBLE
Anesthesia: Moderate Sedation

## 2019-05-17 SURGERY — EGD (ESOPHAGOGASTRODUODENOSCOPY)
Anesthesia: Moderate Sedation

## 2019-05-17 MED ORDER — EPINEPHRINE 1 MG/10ML IJ SOSY
PREFILLED_SYRINGE | INTRAMUSCULAR | Status: AC
  Filled 2019-05-17: qty 10

## 2019-05-17 MED ORDER — SODIUM CHLORIDE 0.9 % IV SOLN
510.0000 mg | Freq: Once | INTRAVENOUS | Status: AC
  Administered 2019-05-17: 510 mg via INTRAVENOUS
  Filled 2019-05-17: qty 17

## 2019-05-17 MED ORDER — SODIUM CHLORIDE 0.9 % IV SOLN
INTRAVENOUS | Status: DC
  Administered 2019-05-17: 15:00:00 via INTRAVENOUS

## 2019-05-17 MED ORDER — ONDANSETRON HCL 4 MG/2ML IJ SOLN
INTRAMUSCULAR | Status: AC
  Filled 2019-05-17: qty 2

## 2019-05-17 MED ORDER — MIDAZOLAM HCL (PF) 5 MG/ML IJ SOLN
INTRAMUSCULAR | Status: AC
  Filled 2019-05-17: qty 2

## 2019-05-17 MED ORDER — SODIUM CHLORIDE 0.9 % IV SOLN
INTRAVENOUS | Status: DC
  Administered 2019-05-17: 15:00:00 1000 mL via INTRAVENOUS

## 2019-05-17 MED ORDER — ATORVASTATIN CALCIUM 40 MG PO TABS
40.0000 mg | ORAL_TABLET | Freq: Every day | ORAL | Status: DC
  Administered 2019-05-17 – 2019-05-20 (×4): 40 mg via ORAL
  Filled 2019-05-17 (×4): qty 1

## 2019-05-17 MED ORDER — SODIUM CHLORIDE 0.9 % IV SOLN
80.0000 mg | Freq: Once | INTRAVENOUS | Status: AC
  Administered 2019-05-17: 80 mg via INTRAVENOUS
  Filled 2019-05-17: qty 80

## 2019-05-17 MED ORDER — FERROUS SULFATE 325 (65 FE) MG PO TABS
325.0000 mg | ORAL_TABLET | Freq: Every day | ORAL | Status: DC
  Administered 2019-05-18 – 2019-05-19 (×2): 325 mg via ORAL
  Filled 2019-05-17 (×2): qty 1

## 2019-05-17 MED ORDER — ONDANSETRON HCL 4 MG/2ML IJ SOLN
4.0000 mg | Freq: Once | INTRAMUSCULAR | Status: AC
  Administered 2019-05-17: 4 mg via INTRAVENOUS

## 2019-05-17 MED ORDER — BUTAMBEN-TETRACAINE-BENZOCAINE 2-2-14 % EX AERO
INHALATION_SPRAY | CUTANEOUS | Status: DC | PRN
  Administered 2019-05-17: 2 via TOPICAL

## 2019-05-17 MED ORDER — DIPHENHYDRAMINE HCL 50 MG/ML IJ SOLN
INTRAMUSCULAR | Status: AC
  Filled 2019-05-17: qty 1

## 2019-05-17 MED ORDER — MIDAZOLAM HCL (PF) 10 MG/2ML IJ SOLN
INTRAMUSCULAR | Status: DC | PRN
  Administered 2019-05-17: 2 mg via INTRAVENOUS
  Administered 2019-05-17: 1 mg via INTRAVENOUS
  Administered 2019-05-17: 2 mg via INTRAVENOUS

## 2019-05-17 MED ORDER — ZOLPIDEM TARTRATE 5 MG PO TABS
5.0000 mg | ORAL_TABLET | Freq: Every evening | ORAL | Status: DC | PRN
  Administered 2019-05-17 – 2019-05-19 (×3): 5 mg via ORAL
  Filled 2019-05-17 (×3): qty 1

## 2019-05-17 MED ORDER — SUCRALFATE 1 GM/10ML PO SUSP
1.0000 g | Freq: Three times a day (TID) | ORAL | Status: DC
  Administered 2019-05-17 – 2019-05-20 (×13): 1 g via ORAL
  Filled 2019-05-17 (×12): qty 10

## 2019-05-17 MED ORDER — SODIUM CHLORIDE 0.9 % IV SOLN
8.0000 mg/h | INTRAVENOUS | Status: DC
  Administered 2019-05-17 – 2019-05-18 (×3): 8 mg/h via INTRAVENOUS
  Filled 2019-05-17 (×4): qty 80

## 2019-05-17 MED ORDER — SODIUM CHLORIDE 0.9 % IV SOLN
Freq: Once | INTRAVENOUS | Status: AC
  Administered 2019-05-17: 11:00:00 via INTRAVENOUS

## 2019-05-17 MED ORDER — SODIUM CHLORIDE 0.9 % IV SOLN
10.0000 mL/h | Freq: Once | INTRAVENOUS | Status: AC
  Administered 2019-05-17: 10 mL/h via INTRAVENOUS

## 2019-05-17 MED ORDER — SODIUM CHLORIDE 0.9 % IV BOLUS: 1000.0000 mL | Freq: Once | INTRAVENOUS | Status: DC

## 2019-05-17 MED ORDER — FENTANYL CITRATE (PF) 100 MCG/2ML IJ SOLN
INTRAMUSCULAR | Status: AC
  Filled 2019-05-17: qty 2

## 2019-05-17 MED ORDER — SODIUM CHLORIDE 0.9 % IV BOLUS: 500.0000 mL | Freq: Once | INTRAVENOUS | Status: DC

## 2019-05-17 MED ORDER — FENTANYL CITRATE (PF) 100 MCG/2ML IJ SOLN
INTRAMUSCULAR | Status: DC | PRN
  Administered 2019-05-17 (×2): 25 ug via INTRAVENOUS

## 2019-05-17 NOTE — ED Notes (Signed)
Patient is being discharged from the [facility] and sent to the Emergency Department via wheelchair by staff. Per Loura Halt, patient is stable but in need of higher level of care due to low blood pressure, near syncopal episode, recent hx of rectal bleeding. Patient is aware and verbalizes understanding of plan of care.  Vitals:   05/17/19 1036  BP: (!) 60/30  Pulse: 84  Resp: 18  Temp: 98.4 F (36.9 C)  SpO2: 100%

## 2019-05-17 NOTE — Telephone Encounter (Signed)
Pt calls because she starting having rectal bleeding last night and is dizzy today.  Attempted to move appt to 11am daughter gets on the phone and tells me that she is not being completely honest.   Daughter states that pt is so lightheaded she is haivng trouble walking.   Advised to take to urgent care of ED now. Daughter and pt is agreeable.    Pt has appt this afternoon with Dr. Worthy Flank.  They will call and cancel once they get an idea from UC. Christen Bame, CMA

## 2019-05-17 NOTE — ED Provider Notes (Signed)
[facility] EMERGENCY DEPARTMENT Provider Note   CSN: [id number] Arrival date & time: 05/17/19  1100    History   Chief Complaint Chief Complaint  Patient presents with  . GI Bleeding    HPI [name] is a 63 y.o. female.     The history is provided by the patient.  GI Problem This is a new problem. The current episode started 6 to 12 hours ago. The problem occurs constantly. The problem has not changed since onset.Pertinent negatives include no chest pain, no abdominal pain, no headaches and no shortness of breath. Associated symptoms comments: Bright red blood from rectum since 4 am. Nothing aggravates the symptoms. Nothing relieves the symptoms. She has tried nothing for the symptoms. The treatment provided no relief.    Past Medical History:  Diagnosis Date  . AC (acromioclavicular) joint arthritis 12/23/2017   Left side  . Acute renal failure ([place]) 09/24/2016  . Anemia   . Arthritis    knees  . Back pain 10/04/2012  . Biliary sludge 04/08/2014   Taking ursodiol chronically    . Blood in stool 10/07/2014  . Blood transfusion without reported diagnosis   . Bunion 03/28/2014  . Cellulitis of leg, left 09/14/2016  . Cervical pain (neck) 03/26/2016  . Chest pain 05/12/2015  . Constipation 10/19/2013   Chronic in the setting of known diverticulosis and history of bleeds.    . Cramping of hands 02/27/2016  . CYST, KIDNEY, ACQUIRED 08/15/2007   Qualifier: Diagnosis of  By: Mellody Drown MD, [facility]    . 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 Degenerative arthritis of right shoulder region 08/2013  . Diverticulosis   . Diverticulosis of colon with hemorrhage    Hx  Of recurrent Diverticular bleeding - several prior admissions   . Diverticulosis of colon without hemorrhage 11/18/2014   Noted on colonoscopy 11/2014   . External hemorrhoids without complication 9/41/7408  . GERD (gastroesophageal reflux  disease)   . High cholesterol   . History of GI bleed   . History of stroke 02/13/2014   Overview:  Dx on MRI when eval for h/a but denies any sxs  . Hypertension    under control with meds., has been on med. > 20 yr.  . Hypopigmented skin lesion 02/23/2011  . Ileus following gastrointestinal surgery ([place]) 12/12/2015  . Intractable vomiting 09/24/2016  . Leg cramping 04/22/2014  . Lipoma of lower extremity 03/07/2013   Overview:  RIGHT THIGH  . Lower GI bleed   . Lumbar back pain 03/26/2016  . Medial meniscus tear 1997   Right Knee  . Mini stroke ([place])   . Morbid obesity ([place]) 12/29/2006  . Multiple open wounds of lower leg 11/12/2016  . Muscle pain 05/26/2015  . Premature supraventricular beats 12/25/2015   EKG 12/25/15   . Rash and nonspecific skin eruption 04/01/2017  . Rotator cuff rupture, complete 08/2013   right  . Shoulder impingement 08/2013   right  . Skin lesion of right lower extremity 05/27/2016  . Small bowel obstruction ([place]) 12/12/2015  . Snoring 08/24/2018  . Stroke [facility]) 2006   Remote left lacunar infarct noted on CT head 2006, 2010   . Subacromial bursitis 05/21/2013  . Superficial thrombophlebitis 01/29/2016  . Tingling 11/17/2017  . Ulcer   . Wears dentures    upper  . Wears partial dentures    lower    Patient Active  Problem List   Diagnosis Date Noted  . Vaginal bleeding 05/17/2019  . Lower GI bleed   . Diverticulosis of colon with hemorrhage   . 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 Peripheral neuropathy 05/20/2017  . Esophageal dysmotility 02/14/2017  . Venous stasis ulcer of left ankle with fat layer exposed with varicose veins ([place]) 10/20/2016  . DDD (degenerative disc disease), lumbar 04/14/2016  . Osteoarthritis of both knees 03/12/2016  . Blood in stool 10/07/2014  . History of stroke 02/13/2014  . Hyperlipidemia 02/13/2014  . Eczema 05/21/2013  . Insomnia 08/29/2012  . GERD  (gastroesophageal reflux disease) 08/24/2012  . Asthma, cough variant 03/10/2012  . Hypertension, benign essential, goal below 140/90 01/05/2012  . Allergic rhinitis 05/27/2010  . Morbid obesity ([place]) 12/29/2006  . Iron deficiency anemia 12/29/2006  . Venous (peripheral) insufficiency 12/29/2006    Past Surgical History:  Procedure Laterality Date  . ABDOMINAL HYSTERECTOMY  ?1987   partial  . COLONOSCOPY  05/02/2000; 05/08/2013  . COSMETIC SURGERY  02/22/2014   skin removal surgery from lower abdomen   . ENDOVENOUS ABLATION SAPHENOUS VEIN W/ LASER Right 08/12/2016   endovenous laser ablation right greater saphenous vein by Curt Jews MD   . ENDOVENOUS ABLATION SAPHENOUS VEIN W/ LASER Left 10/21/2016   EVLA L GSV by Curt Jews MD  . METATARSAL OSTEOTOMY Right 1995   Right Bunion Repair  . ROTATOR CUFF REPAIR Left 04/27/2018  . 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 SHOULDER ARTHROSCOPY WITH ROTATOR CUFF REPAIR AND SUBACROMIAL DECOMPRESSION Right 08/09/2013   Procedure: RIGHT SHOULDER ARTHROSCOPY WITH SUBACROMIAL DECOMPRESSION, DISTAL CLAVICLE EXCISION AND ROTATOR CUFF REPAIR and release biceps;  Surgeon: Ninetta Lights, MD;  Location: [facility];  Service: Orthopedics;  Laterality: Right;  . SUBTOTAL COLECTOMY  11/24/15   [place] Left   . TOTAL KNEE ARTHROPLASTY Right      OB History   No obstetric history on file.      Home Medications    Prior to Admission medications   Medication Sig Start Date End Date Taking? Authorizing Provider  aspirin 81 MG tablet Take 81 mg by mouth daily.    [provider]  atorvastatin (LIPITOR) 40 MG tablet TAKE ONE TABLET DAILY 12/25/18   Alveda Reasons, MD  benzonatate (TESSALON) 100 MG capsule Take 2 capsules (200 mg total) by mouth 3 (three) times daily as needed for cough. 12/12/18   Alveda Reasons, MD  calcium carbonate (OS-CAL) 600 MG TABS tablet Take 1 tablet (600 mg total) by  mouth 2 (two) times daily with a meal. 01/07/14   Funches, Adriana Mccallum, MD  cetirizine (ZYRTEC) 10 MG tablet Take 1 tablet (10 mg total) by mouth daily. Patient taking differently: Take 10 mg by mouth daily as needed for allergies.  05/19/16   Mercy Riding, MD  docusate sodium (DOK) 100 MG capsule TAKE ONE CAPSULE TWICE A DAY AS NEEDED FOR CONSTIPATION 02/27/16   [name], MD  ferrous sulfate 220 (44 Fe) MG/5ML solution Take 5 mLs (220 mg total) by mouth 3 (three) times daily with meals. 11/23/17   Mercy Riding, MD  fluticasone (FLONASE) 50 MCG/ACT nasal spray TWO SPRAYS IN EACH NOSTRIL AT BEDTIME 02/13/19   Alveda Reasons, MD  furosemide (LASIX) 40 MG tablet TAKE ONE TABLET DAILY 02/20/19   Alveda Reasons, MD  hydrochlorothiazide (MICROZIDE) 12.5 MG capsule  TAKE ONE CAPSULE EACH DAY 12/25/18   Alveda Reasons, MD  lisinopril (PRINIVIL,ZESTRIL) 20 MG tablet Take 1 tablet (20 mg total) by mouth daily. 07/01/17   Alveda Reasons, MD  Multiple Vitamin (MULTIVITAMIN) tablet Take 1 tablet by mouth daily. 01/07/14   Funches, Adriana Mccallum, MD  pantoprazole (PROTONIX) 40 MG tablet Take 1 tablet (40 mg total) by mouth daily. 05/03/19   McDiarmid, Blane Ohara, MD  polyethylene glycol powder (GLYCOLAX/MIRALAX) powder Take 17 g by mouth 2 (two) times daily as needed. 05/16/17   Lind Covert, MD  SANTYL ointment Apply topically daily. To legs daily 07/01/17   Alveda Reasons, MD  sucralfate (CARAFATE) 1 GM/10ML suspension TAKE 82ml (2 TEASPOONSFUL) BY MOUTH 4 TIMES DAILY WITH MEALS AND ATBEDTIME 08/24/18   Alveda Reasons, MD  traMADol (ULTRAM) 50 MG tablet Take 1 tablet (50 mg total) by mouth every 8 (eight) hours as needed. 09/02/17   Mercy Riding, MD  triamcinolone ointment (KENALOG) 0.5 % Apply 1 application topically 2 (two) times daily. 06/14/18   Alveda Reasons, MD  vitamin B-12 (CYANOCOBALAMIN) 1000 MCG tablet Take 1,000 mcg by mouth daily.    [provider]  vitamin C (ASCORBIC ACID) 250  MG tablet Take 250 mg by mouth daily.    [provider]  Vitamins A & D 5000-400 units CAPS Take 5,000 Units by mouth daily. 03/16/16   [provider]  zolpidem (AMBIEN) 5 MG tablet TAKE ONE TABLET AT BEDTIME AS NEEDED FORSLEEP 02/20/19   Alveda Reasons, MD    Family History Family History  Problem Relation Age of Onset  . Cancer Father 2       Died from complications of colon CA  . Colon cancer Father        died in his 66s  . Diabetes Mother   . Hypertension Mother   . Diabetes Sister   . Heart disease Sister   . Diabetes Sister   . Stroke Sister   . Diabetes Brother   . Stroke Brother   . Diabetes Brother   . Diabetes Brother   . Diabetes Brother   . Hypertension Son     Social History Social History   Tobacco Use  . Smoking status: Former Smoker    Packs/day: 0.10    Years: 6.00    Pack years: 0.60    Quit date: 1976    Years since quitting: 44.5  . Smokeless tobacco: Never Used  Substance Use Topics  . Alcohol use: Yes    Alcohol/week: 0.0 standard drinks    Comment: occasional wine  . Drug use: No     Allergies   Aspirin, Nsaids, and Tolmetin   Review of Systems Review of Systems  Constitutional: Negative for chills and fever.  HENT: Negative for ear pain and sore throat.   Eyes: Negative for pain and visual disturbance.  Respiratory: Negative for cough and shortness of breath.   Cardiovascular: Negative for chest pain and palpitations.  Gastrointestinal: Positive for blood in stool. Negative for abdominal pain and vomiting.  Genitourinary: Negative for dysuria and hematuria.  Musculoskeletal: Negative for arthralgias and back pain.  Skin: Negative for color change and rash.  Neurological: Negative for seizures, syncope and headaches.  All other systems reviewed and are negative.    Physical Exam Updated Vital Signs  ED Triage Vitals  Enc Vitals Group     BP 05/17/19 1108 (!) 123/91     Pulse Rate 05/17/19  1108 100      Resp 05/17/19 1108 16     Temp 05/17/19 1108 98.3 F (36.8 C)     Temp Source 05/17/19 1108 Oral     SpO2 05/17/19 1108 100 %     Weight 05/17/19 1110 200 lb (90.7 kg)     Height 05/17/19 1110 5\' 4"  (1.626 m)     Head Circumference --      Peak Flow --      Pain Score 05/17/19 1110 0     Pain Loc --      Pain Edu? --      Excl. in [place]? --     Physical Exam Vitals signs and nursing note reviewed.  Constitutional:      General: She is not in acute distress.    Appearance: She is well-developed. She is ill-appearing.  HENT:     Head: Normocephalic and atraumatic.     Nose: Nose normal.  Eyes:     Extraocular Movements: Extraocular movements intact.     Conjunctiva/sclera: Conjunctivae normal.     Pupils: Pupils are equal, round, and reactive to light.     Comments: Pale conjuctiva   Neck:     Musculoskeletal: Normal range of motion and neck supple.  Cardiovascular:     Rate and Rhythm: Regular rhythm. Tachycardia present.     Pulses: Normal pulses.     Heart sounds: Normal heart sounds. No murmur.  Pulmonary:     Effort: Pulmonary effort is normal. No respiratory distress.     Breath sounds: Normal breath sounds.  Abdominal:     General: There is no distension.     Palpations: Abdomen is soft.     Tenderness: There is no abdominal tenderness.  Genitourinary:    Rectum: Guaiac result positive (maroon stool ).  Musculoskeletal: Normal range of motion.  Skin:    General: Skin is warm and dry.     Capillary Refill: Capillary refill takes less than 2 seconds.     Coloration: Skin is pale.  Neurological:     General: No focal deficit present.     Mental Status: She is alert.  Psychiatric:        Mood and Affect: Mood normal.      ED Treatments / Results  Labs (all labs ordered are listed, but only abnormal results are displayed) Labs Reviewed  CBC WITH DIFFERENTIAL/PLATELET - Abnormal; Notable for the following components:      Result Value   RBC 2.85 (*)     Hemoglobin 7.6 (*)    HCT 25.5 (*)    MCHC 29.8 (*)    RDW 20.8 (*)    All other components within normal limits  POC OCCULT BLOOD, ED - Abnormal; Notable for the following components:   Fecal Occult Bld POSITIVE (*)    All other components within normal limits  SARS CORONAVIRUS 2 (HOSPITAL ORDER, [place] [facility])  COMPREHENSIVE METABOLIC PANEL  TYPE AND SCREEN  PREPARE RBC (CROSSMATCH)    EKG None  Radiology No results found.  Procedures .Critical Care Performed by: Lennice Sites, DO Authorized by: Lennice Sites, DO   Critical care provider statement:    Critical care time (minutes):  35   Critical care was necessary to treat or prevent imminent or life-threatening deterioration of the following conditions:  Circulatory failure   Critical care was time spent personally by me on the following activities:  Blood draw for specimens, development of treatment plan  with patient or surrogate, discussions with consultants, discussions with primary provider, evaluation of patient's response to treatment, examination of patient, obtaining history from patient or surrogate, ordering and performing treatments and interventions, ordering and review of laboratory studies, ordering and review of radiographic studies, pulse oximetry, re-evaluation of patient's condition and review of old charts   I assumed direction of critical care for this patient from another provider in my specialty: no     (including critical care time)  Medications Ordered in ED Medications  0.9 %  sodium chloride infusion (has no administration in time range)     Initial Impression / Assessment and Plan / ED Course  I have reviewed the triage vital signs and the nursing notes.  Pertinent labs & imaging results that were available during my care of the patient were reviewed by me and considered in my medical decision making (see chart for details).     [name] is a 63 year old  female with history of diverticulosis who presents to the ED with bright red blood per rectum.  Patient had syncopal episode while [facility] care [facility] road.  Was hypotensive.  Resolved with fluids.  Patient has maroon stools on exam.  She states that this started around 4 AM.  There is no active massive hemorrhage on examination but she does have a fair amount of maroon stool.  She is not on blood thinners.  Has had colonoscopies in the past that shows diverticulosis.  Does not have any abdominal pain, no abdominal tenderness on exam.  She is mildly tachycardic.  Pale-appearing.  Suspect lower GI bleed.  CBC is 7.6 which is down from 10. BUN elevated. Will transfuse 1 unit of blood given tachycardia and likely downtrending CBC.  Patient to be admitted for further GI work-up.  [facility] has been consulted.  Hemodynamically improved throughout my care.  This chart was dictated using voice recognition software.  Despite best efforts to proofread,  errors can occur which can change the documentation meaning.    Final Clinical Impressions(s) / ED Diagnoses   Final diagnoses:  Gastrointestinal hemorrhage, unspecified gastrointestinal hemorrhage type    ED Discharge Orders    None       Lennice Sites, DO 05/17/19 1218

## 2019-05-17 NOTE — Assessment & Plan Note (Deleted)
Steroid injection 05/2019 by Fredonia Highland, MD (Ortho)

## 2019-05-17 NOTE — ED Notes (Addendum)
Clarene Critchley CMA notified Loura Halt NP of patient vitals signs and complaints, Traci NP at bedside along with Jerry Caras RN. Patient became altered, tachycardic, diaphoretic and became syncopal. Patient laid flat by urgent care staff, placed on Zoll monitor, IV started in right [facility] with 1000 ml NS bolus. EMS called for transportation to ED

## 2019-05-17 NOTE — ED Triage Notes (Signed)
Pt states she is bleeding from her rectum and its bright red. Pt states it's constant. Pt states she feels weak from the loss of blood.

## 2019-05-17 NOTE — ED Notes (Addendum)
Patient is now responsive, remains diaphoretic, reports feeling a little bit better.

## 2019-05-17 NOTE — ED Provider Notes (Addendum)
[place]    CSN: [id number] Arrival date & time: 05/17/19  1000      History   Chief Complaint Chief Complaint  Patient presents with  . Rectal Bleeding    HPI [name] is a 63 y.o. female with a history of CVA, hyperlipidemia comes to urgent care with complaints of 2 episodes of bloody bowel movement which started sometime around 4 AM today.  Onset was abrupt.  She has had 2 bright red bowel movements.  She denies any abdominal pain with that.  She said after the second bowel movement she started having dizziness anytime she bent over or trying to make certain moves.  She came to the urgent care to be evaluated.  When she got to the urgent care her initial blood pressure was 60/30 with heart rate of 84.  Patient was retching and had altered mentation.   HPI  Past Medical History:  Diagnosis Date  . AC (acromioclavicular) joint arthritis 12/23/2017   Left side  . Acute renal failure ([place]) 09/24/2016  . Anemia   . Arthritis    knees  . Back pain 10/04/2012  . Biliary sludge 04/08/2014   Taking ursodiol chronically    . Blood in stool 10/07/2014  . Blood transfusion without reported diagnosis   . Bunion 03/28/2014  . Cellulitis of leg, left 09/14/2016  . Cervical pain (neck) 03/26/2016  . Chest pain 05/12/2015  . Constipation 10/19/2013   Chronic in the setting of known diverticulosis and history of bleeds.    . Cramping of hands 02/27/2016  . CYST, KIDNEY, ACQUIRED 08/15/2007   Qualifier: Diagnosis of  By: Mellody Drown MD, [facility]    . 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 Degenerative arthritis of right shoulder region 08/2013  . Diverticulosis   . Diverticulosis of colon with hemorrhage    Hx  Of recurrent Diverticular bleeding - several prior admissions   . Diverticulosis of colon without hemorrhage 11/18/2014   Noted on colonoscopy 11/2014   . External hemorrhoids without complication 11/27/5168  . GERD  (gastroesophageal reflux disease)   . High cholesterol   . History of GI bleed   . History of stroke 02/13/2014   Overview:  Dx on MRI when eval for h/a but denies any sxs  . Hypertension    under control with meds., has been on med. > 20 yr.  . Hypopigmented skin lesion 02/23/2011  . Ileus following gastrointestinal surgery ([place]) 12/12/2015  . Intractable vomiting 09/24/2016  . Leg cramping 04/22/2014  . Lipoma of lower extremity 03/07/2013   Overview:  RIGHT THIGH  . Lower GI bleed   . Lumbar back pain 03/26/2016  . Medial meniscus tear 1997   Right Knee  . Mini stroke ([place])   . Morbid obesity ([place]) 12/29/2006  . Multiple open wounds of lower leg 11/12/2016  . Muscle pain 05/26/2015  . Premature supraventricular beats 12/25/2015   EKG 12/25/15   . Rash and nonspecific skin eruption 04/01/2017  . Rotator cuff rupture, complete 08/2013   right  . Shoulder impingement 08/2013   right  . Skin lesion of right lower extremity 05/27/2016  . Small bowel obstruction ([place]) 12/12/2015  . Snoring 08/24/2018  . Stroke [facility]) 2006   Remote left lacunar infarct noted on CT head 2006, 2010   . Subacromial bursitis 05/21/2013  . Superficial thrombophlebitis 01/29/2016  . Tingling 11/17/2017  . Ulcer   .  Wears dentures    upper  . Wears partial dentures    lower    Patient Active Problem List   Diagnosis Date Noted  . Vaginal bleeding 05/17/2019  . Lower GI bleed   . Diverticulosis of colon with hemorrhage   . 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 Peripheral neuropathy 05/20/2017  . Esophageal dysmotility 02/14/2017  . Venous stasis ulcer of left ankle with fat layer exposed with varicose veins ([place]) 10/20/2016  . DDD (degenerative disc disease), lumbar 04/14/2016  . Osteoarthritis of both knees 03/12/2016  . Blood in stool 10/07/2014  . History of stroke 02/13/2014  . Hyperlipidemia 02/13/2014  . Eczema 05/21/2013  . Insomnia  08/29/2012  . GERD (gastroesophageal reflux disease) 08/24/2012  . Asthma, cough variant 03/10/2012  . Hypertension, benign essential, goal below 140/90 01/05/2012  . Allergic rhinitis 05/27/2010  . Morbid obesity ([place]) 12/29/2006  . Iron deficiency anemia 12/29/2006  . Venous (peripheral) insufficiency 12/29/2006    Past Surgical History:  Procedure Laterality Date  . ABDOMINAL HYSTERECTOMY  ?1987   partial  . COLONOSCOPY  05/02/2000; 05/08/2013  . COSMETIC SURGERY  02/22/2014   skin removal surgery from lower abdomen   . ENDOVENOUS ABLATION SAPHENOUS VEIN W/ LASER Right 08/12/2016   endovenous laser ablation right greater saphenous vein by Curt Jews MD   . ENDOVENOUS ABLATION SAPHENOUS VEIN W/ LASER Left 10/21/2016   EVLA L GSV by Curt Jews MD  . METATARSAL OSTEOTOMY Right 1995   Right Bunion Repair  . ROTATOR CUFF REPAIR Left 04/27/2018  . 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 SHOULDER ARTHROSCOPY WITH ROTATOR CUFF REPAIR AND SUBACROMIAL DECOMPRESSION Right 08/09/2013   Procedure: RIGHT SHOULDER ARTHROSCOPY WITH SUBACROMIAL DECOMPRESSION, DISTAL CLAVICLE EXCISION AND ROTATOR CUFF REPAIR and release biceps;  Surgeon: Ninetta Lights, MD;  Location: [facility];  Service: Orthopedics;  Laterality: Right;  . SUBTOTAL COLECTOMY  11/24/15   [place] Left   . TOTAL KNEE ARTHROPLASTY Right     OB History   No obstetric history on file.      Home Medications    Prior to Admission medications   Medication Sig Start Date End Date Taking? Authorizing Provider  aspirin 81 MG tablet Take 81 mg by mouth daily.    [provider]  atorvastatin (LIPITOR) 40 MG tablet TAKE ONE TABLET DAILY 12/25/18   Alveda Reasons, MD  benzonatate (TESSALON) 100 MG capsule Take 2 capsules (200 mg total) by mouth 3 (three) times daily as needed for cough. 12/12/18   Alveda Reasons, MD  calcium carbonate (OS-CAL) 600 MG TABS tablet Take 1 tablet  (600 mg total) by mouth 2 (two) times daily with a meal. 01/07/14   Funches, Adriana Mccallum, MD  cetirizine (ZYRTEC) 10 MG tablet Take 1 tablet (10 mg total) by mouth daily. Patient taking differently: Take 10 mg by mouth daily as needed for allergies.  05/19/16   Mercy Riding, MD  docusate sodium (DOK) 100 MG capsule TAKE ONE CAPSULE TWICE A DAY AS NEEDED FOR CONSTIPATION 02/27/16   [name], MD  ferrous sulfate 220 (44 Fe) MG/5ML solution Take 5 mLs (220 mg total) by mouth 3 (three) times daily with meals. 11/23/17   Mercy Riding, MD  fluticasone (FLONASE) 50 MCG/ACT nasal spray TWO SPRAYS IN EACH NOSTRIL AT BEDTIME 02/13/19   Alveda Reasons, MD  furosemide (LASIX) 40  MG tablet TAKE ONE TABLET DAILY 02/20/19   Alveda Reasons, MD  hydrochlorothiazide (MICROZIDE) 12.5 MG capsule TAKE ONE CAPSULE EACH DAY 12/25/18   Alveda Reasons, MD  lisinopril (PRINIVIL,ZESTRIL) 20 MG tablet Take 1 tablet (20 mg total) by mouth daily. 07/01/17   Alveda Reasons, MD  Multiple Vitamin (MULTIVITAMIN) tablet Take 1 tablet by mouth daily. 01/07/14   Funches, Adriana Mccallum, MD  pantoprazole (PROTONIX) 40 MG tablet Take 1 tablet (40 mg total) by mouth daily. 05/03/19   McDiarmid, Blane Ohara, MD  polyethylene glycol powder (GLYCOLAX/MIRALAX) powder Take 17 g by mouth 2 (two) times daily as needed. 05/16/17   Lind Covert, MD  SANTYL ointment Apply topically daily. To legs daily 07/01/17   Alveda Reasons, MD  sucralfate (CARAFATE) 1 GM/10ML suspension TAKE 27ml (2 TEASPOONSFUL) BY MOUTH 4 TIMES DAILY WITH MEALS AND ATBEDTIME 08/24/18   Alveda Reasons, MD  traMADol (ULTRAM) 50 MG tablet Take 1 tablet (50 mg total) by mouth every 8 (eight) hours as needed. 09/02/17   Mercy Riding, MD  triamcinolone ointment (KENALOG) 0.5 % Apply 1 application topically 2 (two) times daily. 06/14/18   Alveda Reasons, MD  vitamin B-12 (CYANOCOBALAMIN) 1000 MCG tablet Take 1,000 mcg by mouth daily.    [provider]  vitamin C  (ASCORBIC ACID) 250 MG tablet Take 250 mg by mouth daily.    [provider]  Vitamins A & D 5000-400 units CAPS Take 5,000 Units by mouth daily. 03/16/16   [provider]  zolpidem (AMBIEN) 5 MG tablet TAKE ONE TABLET AT BEDTIME AS NEEDED FORSLEEP 02/20/19   Alveda Reasons, MD    Family History Family History  Problem Relation Age of Onset  . Cancer Father 72       Died from complications of colon CA  . Colon cancer Father        died in his 33s  . Diabetes Mother   . Hypertension Mother   . Diabetes Sister   . Heart disease Sister   . Diabetes Sister   . Stroke Sister   . Diabetes Brother   . Stroke Brother   . Diabetes Brother   . Diabetes Brother   . Diabetes Brother   . Hypertension Son     Social History Social History   Tobacco Use  . Smoking status: Former Smoker    Packs/day: 0.10    Years: 6.00    Pack years: 0.60    Quit date: 1976    Years since quitting: 44.5  . Smokeless tobacco: Never Used  Substance Use Topics  . Alcohol use: Yes    Alcohol/week: 0.0 standard drinks    Comment: occasional wine  . Drug use: No     Allergies   Aspirin, Nsaids, and Tolmetin   Review of Systems Review of Systems  Unable to perform ROS: Mental status change     Physical Exam Triage Vital Signs ED Triage Vitals  Enc Vitals Group     BP 05/17/19 1036 (!) 60/30     Pulse Rate 05/17/19 1036 84     Resp 05/17/19 1036 18     Temp 05/17/19 1036 98.4 F (36.9 C)     Temp src --      SpO2 05/17/19 1036 100 %     Weight --      Height --      Head Circumference --      Peak Flow --  Pain Score 05/17/19 1037 1     Pain Loc --      Pain Edu? --      Excl. in [place]? --    No data found.  Updated Vital Signs BP (!) 60/30 (BP Location: Right Arm)   Pulse 84   Temp 98.4 F (36.9 C)   Resp 18   SpO2 100%   Visual Acuity Right Eye Distance:   Left Eye Distance:   Bilateral Distance:    Right Eye Near:   Left Eye Near:     Bilateral Near:     Physical Exam Vitals signs and nursing note reviewed.  Constitutional:      General: She is in acute distress.     Appearance: She is ill-appearing, toxic-appearing and diaphoretic.  Neck:     Musculoskeletal: Normal range of motion and neck supple.  Cardiovascular:     Rate and Rhythm: Tachycardia present.     Heart sounds: No murmur. No gallop.      Comments: Pulse was initially thready and weak Pulmonary:     Effort: Pulmonary effort is normal.     Breath sounds: Normal breath sounds.  Abdominal:     General: Bowel sounds are normal.     Palpations: Abdomen is soft.  Musculoskeletal: Normal range of motion.  Skin:    General: Skin is warm.     Capillary Refill: Capillary refill takes less than 2 seconds.     Coloration: Skin is pale.     Findings: No rash.  Neurological:     General: No focal deficit present.     Mental Status: She is disoriented.      UC Treatments / Results  Labs (all labs ordered are listed, but only abnormal results are displayed) Labs Reviewed - No data to display  EKG   Radiology No results found.  Procedures Procedures (including critical care time)  Medications Ordered in UC Medications  0.9 %  sodium chloride infusion ( Intravenous New Bag/Given 05/17/19 1053)    Initial Impression / Assessment and Plan / UC Course  I have reviewed the triage vital signs and the nursing notes.  Pertinent labs & imaging results that were available during my care of the patient were reviewed by me and considered in my medical decision making (see chart for details).     1.  Lower GI bleed with hypotension: IV access was secured Patient was placed in a recumbent position Normal saline was started 911 was called for patient to be transported to the ED for further management Patient had altered mentation secondary to hypotension  2.  Acute encephalopathy likely secondary to hypotension: Patient is discharged from urgent care  and sent to emergency department Final Clinical Impressions(s) / UC Diagnoses   Final diagnoses:  None   Discharge Instructions   None    ED Prescriptions    None     Controlled Substance Prescriptions [place] Controlled Substance Registry consulted? No   Chase Picket, MD 05/17/19 1122    Chase Picket, MD 05/17/19 1122

## 2019-05-17 NOTE — Telephone Encounter (Signed)
Reviewed

## 2019-05-17 NOTE — ED Triage Notes (Signed)
To Ed via [facility] from [facility] with GI Bleed-- bright red, started last night, hx of same-- IV 18 g right AC Went to [facility], in itial BP was 80 systolic, sat up and it dropped to 60 systolic.

## 2019-05-17 NOTE — Plan of Care (Signed)
  Problem: Education: Goal: Knowledge of General Education information will improve Description Including pain rating scale, medication(s)/side effects and non-pharmacologic comfort measures Outcome: Progressing   Problem: Health Behavior/Discharge Planning: Goal: Ability to manage health-related needs will improve Outcome: Progressing   

## 2019-05-17 NOTE — ED Notes (Signed)
Blood consent obtained from patient and sent to medical records

## 2019-05-17 NOTE — Op Note (Addendum)
[facility] [name] Procedure Date : 05/17/2019 MRN: [id number] Attending MD: Gatha Mayer , MD Date of Birth: 1956/08/02 CSN: [id number] Age: 63 Admit Type: Inpatient Procedure:                Upper GI endoscopy Indications:              Hematochezia Providers:                Gatha Mayer, MD Referring MD:              Medicines:                Midazolam 5 mg IV, Fentanyl 50 micrograms IV Complications:            No immediate complications. Estimated Blood Loss:     Estimated blood loss was minimal. Procedure:                Pre-Anesthesia Assessment:                           - Prior to the procedure, a History and Physical                            was performed, and patient medications and                            allergies were reviewed. The patient's tolerance of                            previous anesthesia was also reviewed. The risks                            and benefits of the procedure and the sedation                            options and risks were discussed with the patient.                            All questions were answered, and informed consent                            was obtained. Prior Anticoagulants: The patient has                            taken no previous anticoagulant or antiplatelet                            agents. ASA Grade Assessment: III - A patient with                            severe systemic disease. After reviewing the risks                            and benefits, the patient was deemed in  satisfactory condition to undergo the procedure.                           After obtaining informed consent, the endoscope was                            passed under direct vision. Throughout the                            procedure, the patient's blood pressure, pulse, and                            oxygen saturations were monitored continuously. The   GIF-H190 ([id number] Olympus gastroscope was                            introduced through the mouth, and advanced to the                            jejunum. The upper GI endoscopy was accomplished                            without difficulty. The patient tolerated the                            procedure well. Scope In: Scope Out: Findings:      Evidence of a gastric bypass was found. A gastric pouch was found. The       gastrojejunal anastomosis was characterized by ulceration.      One non-bleeding cratered gastric ulcer with a nonbleeding visible       vessel (Forrest Class IIa) was found at the anastomosis. The lesion was       8 mm in largest dimension. For hemostasis, three hemostatic clips were       successfully placed (MR conditional). Area was successfully injected       with 5 mL of a 1:10,000 solution of epinephrine for hemostasis.       Estimated blood loss was minimal.      The exam was otherwise without abnormality. Impression:               - Gastric bypass. Gastrojejunal anastomosis                            characterized by ulceration.                           - Non-bleeding gastric ulcer with a nonbleeding                            visible vessel (Forrest Class IIa). Clips (MR                            conditional) were placed. Injected.                           - The examination was otherwise normal.                           -  No specimens collected. Recommendation:           - Return patient to hospital ward for ongoing care.                           - Clear liquid diet.                           - CONTINUE PPI INFUSION                           NOTE THAT SHE WAS ON PANTOPRAZOLE TABLETS PTA AND                            THOSE WILL NOT ABSORB WELL IN A GASTRIC BYPASS                            PATIENT SO SHE NEEDS CAPSULE OR DISSOLVABLE OR                            POWDER PPI                           WILL F/U TOMORROW                           ADD  CARAFATE                           FERAHEME X 1 LAST FERRITIN LATE 2019 BARELY NL Procedure Code(s):        --- Professional ---                           [phone], Esophagogastroduodenoscopy, flexible,                            transoral; with control of bleeding, any method Diagnosis Code(s):        --- Professional ---                           K28.9, Gastrojejunal ulcer, unspecified as acute or                            chronic, without hemorrhage or perforation                           K25.4, Chronic or unspecified gastric ulcer with                            hemorrhage                           K92.1, Melena (includes Hematochezia) CPT copyright 2019 [employer]. All rights reserved. The codes documented in this report are preliminary and upon coder review may  be revised to meet current compliance requirements. Gatha Mayer, MD 05/17/2019 3:49:30 PM This report has been signed electronically. Number of Addenda:  0 

## 2019-05-17 NOTE — Consult Note (Addendum)
[facility] Consult: 12:42 PM 05/17/2019  LOS: 0 days    Referring Provider: Dr. Ronnald Nian in ED Primary Care Physician:  McDiarmid, Blane Ohara, MD Primary Gastroenterologist:  Dr. Deatra Ina    Reason for Consultation: GI bleed with burgundy stools and orthostasis.   HPI: [name] is a 63 y.o. female.   Hx DM 2.  Hypertension.  CVA.  Asthma.  Obesity.  GERD.  R & Y Gastric bypass 2013.   Recurrent diverticular bleeds with blood loss anemia dating back to at least 2001. Up to 10 admissions for bleeds.  GI Colonoscopy dating back to 2001, 2007.  These all showed diverticular disease.  Several admissions between 2007 and 2009 with diverticular bleeds. Tubular adenomatous colon polyp in 12/2007  05/2013 colonoscopy.  Dr. Deatra Ina.  For hematochezia, first-degree relative with colon cancer, personal history adenomatous colon polyps.  Sessile, sigmoid polyp removed; path consistent with inflammatory polyp..  Dense pandiverticulosis.  No fresh or old blood encountered.  Presumed to have had diverticular bleed. 11/2014 colonoscopy For hematochezia.  Pandiverticulosis, no fresh or old blood.  Presumed diverticular bleed. 06/2015 EGD For evaluation heartburn.  Superficial ulceration at anastomosis,  fundic gland polyp. 08/2015 capsule endoscopy, precursor to subtotal colectomy, to rule out other source for bleeding.  Other than a few scattered, nonbleeding erosions no findings.  Good prep. 05/2000 Meckel scan negative. 12/2007 nuclear medicine bleeding scan positive for bleeding at the level of the splenic flexure 12/2014, 07/2015 nuclear med bleeding studies negative for acute bleeding. 06/2015 esophagram showed patulous esophagus, esophageal dysmotility, presbyesophagus.  Some retained secretions in the esophagus.  Mild GER 11/2015  laparoscopic-assisted subtotal colectomy with ileorectal anastomosis. 12/2016 EGD at [facility] In late 03/2019 her PMD referred her to Dr. Marcello Moores for complaint of stool-like vaginal discharge.  Underwent anoscopy by Dr. Leighton Ruff 12/10/7679 with no evidence for RVF.  She placed an order for CT of pelvis, set for 06/01/2019  Patient has not had any recurrent GI bleeding since her surgery.  Notes from [facility] mention need for flexible sigmoidoscopy for polyp surveillance but this has not been performed.  Patient has occasional reflux symptoms she treats with as needed Tums like tablet.  Good appetite.  No abdominal pain.  Because of her history of GI bleeding the only aspirin/NSAID products she uses is a low-dose aspirin daily.  Has been treating right leg pain with topical diclofenac.  no alcohol.  Between 4 AM and 9 AM today she had about 4 episodes of burgundy, bloody stool.  She got dizzy.  At urgent care she had a syncopal spell associated with nausea but no vomiting.  She was orthostatic and hypotensive.  Transferred to the ED. Hgb 7.6, it was 10 on 04/28/2019.  Ranges between 9.6 and 10.1 in the previous 6 months.  MCV 89. PT/INR normal. BUN 30, that is up from 15 three weeks ago.  Creatinine has gone from 0.8 >> 0.9 during the same interval  She is now receiving a unit of PRBC, has received 1.5 L of normal saline and  is feeling much improved.  No bleeding since 9 AM this morning.  Fm HX colon cancer in her father.  Past Medical History:  Diagnosis Date  . AC (acromioclavicular) joint arthritis 12/23/2017   Left side  . Acute renal failure ([place]) 09/24/2016  . Anemia   . Arthritis    knees  . Back pain 10/04/2012  . Biliary sludge 04/08/2014   Taking ursodiol chronically    . Blood in stool 10/07/2014  . Blood transfusion without reported diagnosis   . Bunion 03/28/2014  . Cellulitis of leg, left 09/14/2016  . Cervical pain (neck) 03/26/2016  . Chest pain 05/12/2015  . Constipation  10/19/2013   Chronic in the setting of known diverticulosis and history of bleeds.    . Cramping of hands 02/27/2016  . CYST, KIDNEY, ACQUIRED 08/15/2007   Qualifier: Diagnosis of  By: Mellody Drown MD, [facility]    . 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 Degenerative arthritis of right shoulder region 08/2013  . Diverticulosis   . Diverticulosis of colon with hemorrhage    Hx  Of recurrent Diverticular bleeding - several prior admissions   . Diverticulosis of colon without hemorrhage 11/18/2014   Noted on colonoscopy 11/2014   . External hemorrhoids without complication 2/35/5732  . GERD (gastroesophageal reflux disease)   . High cholesterol   . History of GI bleed   . History of stroke 02/13/2014   Overview:  Dx on MRI when eval for h/a but denies any sxs  . Hypertension    under control with meds., has been on med. > 20 yr.  . Hypopigmented skin lesion 02/23/2011  . Ileus following gastrointestinal surgery ([place]) 12/12/2015  . Intractable vomiting 09/24/2016  . Leg cramping 04/22/2014  . Lipoma of lower extremity 03/07/2013   Overview:  RIGHT THIGH  . Lower GI bleed   . Lumbar back pain 03/26/2016  . Medial meniscus tear 1997   Right Knee  . Mini stroke ([place])   . Morbid obesity ([place]) 12/29/2006  . Multiple open wounds of lower leg 11/12/2016  . Muscle pain 05/26/2015  . Premature supraventricular beats 12/25/2015   EKG 12/25/15   . Rash and nonspecific skin eruption 04/01/2017  . Rotator cuff rupture, complete 08/2013   right  . Shoulder impingement 08/2013   right  . Skin lesion of right lower extremity 05/27/2016  . Small bowel obstruction ([place]) 12/12/2015  . Snoring 08/24/2018  . Stroke [facility]) 2006   Remote left lacunar infarct noted on CT head 2006, 2010   . Subacromial bursitis 05/21/2013  . Superficial thrombophlebitis 01/29/2016  . Tingling 11/17/2017  . Ulcer   . Wears dentures    upper  . Wears partial dentures    lower     Past Surgical History:  Procedure Laterality Date  . ABDOMINAL HYSTERECTOMY  ?1987   partial  . COLONOSCOPY  05/02/2000; 05/08/2013  . COSMETIC SURGERY  02/22/2014   skin removal surgery from lower abdomen   . ENDOVENOUS ABLATION SAPHENOUS VEIN W/ LASER Right 08/12/2016   endovenous laser ablation right greater saphenous vein by Curt Jews MD   . ENDOVENOUS ABLATION SAPHENOUS VEIN W/ LASER Left 10/21/2016   EVLA L GSV by Curt Jews MD  . METATARSAL OSTEOTOMY Right 1995   Right Bunion Repair  . ROTATOR CUFF REPAIR Left 04/27/2018  . 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 SHOULDER ARTHROSCOPY WITH ROTATOR CUFF REPAIR AND SUBACROMIAL DECOMPRESSION Right  08/09/2013   Procedure: RIGHT SHOULDER ARTHROSCOPY WITH SUBACROMIAL DECOMPRESSION, DISTAL CLAVICLE EXCISION AND ROTATOR CUFF REPAIR and release biceps;  Surgeon: Ninetta Lights, MD;  Location: [facility];  Service: Orthopedics;  Laterality: Right;  . SUBTOTAL COLECTOMY  11/24/15   [place] Left   . TOTAL KNEE ARTHROPLASTY Right     Prior to Admission medications   Medication Sig Start Date End Date Taking? Authorizing Provider  aspirin 81 MG tablet Take 81 mg by mouth daily.    [provider]  atorvastatin (LIPITOR) 40 MG tablet TAKE ONE TABLET DAILY 12/25/18   Alveda Reasons, MD  benzonatate (TESSALON) 100 MG capsule Take 2 capsules (200 mg total) by mouth 3 (three) times daily as needed for cough. 12/12/18   Alveda Reasons, MD  calcium carbonate (OS-CAL) 600 MG TABS tablet Take 1 tablet (600 mg total) by mouth 2 (two) times daily with a meal. 01/07/14   Funches, Adriana Mccallum, MD  cetirizine (ZYRTEC) 10 MG tablet Take 1 tablet (10 mg total) by mouth daily. Patient taking differently: Take 10 mg by mouth daily as needed for allergies.  05/19/16   Mercy Riding, MD  docusate sodium (DOK) 100 MG capsule TAKE ONE CAPSULE TWICE A DAY AS NEEDED FOR CONSTIPATION 02/27/16   [name], MD   ferrous sulfate 220 (44 Fe) MG/5ML solution Take 5 mLs (220 mg total) by mouth 3 (three) times daily with meals. 11/23/17   Mercy Riding, MD  fluticasone (FLONASE) 50 MCG/ACT nasal spray TWO SPRAYS IN EACH NOSTRIL AT BEDTIME 02/13/19   Alveda Reasons, MD  furosemide (LASIX) 40 MG tablet TAKE ONE TABLET DAILY 02/20/19   Alveda Reasons, MD  hydrochlorothiazide (MICROZIDE) 12.5 MG capsule TAKE ONE CAPSULE EACH DAY 12/25/18   Alveda Reasons, MD  lisinopril (PRINIVIL,ZESTRIL) 20 MG tablet Take 1 tablet (20 mg total) by mouth daily. 07/01/17   Alveda Reasons, MD  Multiple Vitamin (MULTIVITAMIN) tablet Take 1 tablet by mouth daily. 01/07/14   Funches, Adriana Mccallum, MD  pantoprazole (PROTONIX) 40 MG tablet Take 1 tablet (40 mg total) by mouth daily. 05/03/19   McDiarmid, Blane Ohara, MD  polyethylene glycol powder (GLYCOLAX/MIRALAX) powder Take 17 g by mouth 2 (two) times daily as needed. 05/16/17   Lind Covert, MD  SANTYL ointment Apply topically daily. To legs daily 07/01/17   Alveda Reasons, MD  sucralfate (CARAFATE) 1 GM/10ML suspension TAKE 56ml (2 TEASPOONSFUL) BY MOUTH 4 TIMES DAILY WITH MEALS AND ATBEDTIME 08/24/18   Alveda Reasons, MD  traMADol (ULTRAM) 50 MG tablet Take 1 tablet (50 mg total) by mouth every 8 (eight) hours as needed. 09/02/17   Mercy Riding, MD  triamcinolone ointment (KENALOG) 0.5 % Apply 1 application topically 2 (two) times daily. 06/14/18   Alveda Reasons, MD  vitamin B-12 (CYANOCOBALAMIN) 1000 MCG tablet Take 1,000 mcg by mouth daily.    [provider]  vitamin C (ASCORBIC ACID) 250 MG tablet Take 250 mg by mouth daily.    [provider]  Vitamins A & D 5000-400 units CAPS Take 5,000 Units by mouth daily. 03/16/16   [provider]  zolpidem (AMBIEN) 5 MG tablet TAKE ONE TABLET AT BEDTIME AS NEEDED FORSLEEP 02/20/19   Alveda Reasons, MD    Scheduled Meds:  Infusions: . sodium chloride     PRN Meds:    Allergies as of  05/17/2019 - Review Complete  04/28/2019  Allergen Reaction Noted  . Aspirin Other (See Comments) 05/12/2007  . Nsaids Other (See Comments) 07/24/2009  . Tolmetin Other (See Comments) 01/14/2016    Family History  Problem Relation Age of Onset  . Cancer Father 54       Died from complications of colon CA  . Colon cancer Father        died in his 14s  . Diabetes Mother   . Hypertension Mother   . Diabetes Sister   . Heart disease Sister   . Diabetes Sister   . Stroke Sister   . Diabetes Brother   . Stroke Brother   . Diabetes Brother   . Diabetes Brother   . Diabetes Brother   . Hypertension Son     Social History   Socioeconomic History  . Marital status: Divorced    Spouse name: Not on file  . Number of children: 1  . Years of education: Not on file  . Highest education level: Not on file  Occupational History  . Occupation: child care   Social Needs  . Financial resource strain: Not on file  . Food insecurity    Worry: Not on file    Inability: Not on file  . Transportation needs    Medical: Not on file    Non-medical: Not on file  Tobacco Use  . Smoking status: Former Smoker    Packs/day: 0.10    Years: 6.00    Pack years: 0.60    Quit date: 1976    Years since quitting: 44.5  . Smokeless tobacco: Never Used  Substance and Sexual Activity  . Alcohol use: Yes    Alcohol/week: 0.0 standard drinks    Comment: occasional wine  . Drug use: No  . Sexual activity: Yes  Lifestyle  . Physical activity    Days per week: Not on file    Minutes per session: Not on file  . Stress: Not on file  Relationships  . Social Herbalist on phone: Not on file    Gets together: Not on file    Attends religious service: Not on file    Active member of club or organization: Not on file    Attends meetings of clubs or organizations: Not on file    Relationship status: Not on file  . Intimate partner violence    Fear of current or ex partner: Not on file     Emotionally abused: Not on file    Physically abused: Not on file    Forced sexual activity: Not on file  Other Topics Concern  . Not on file  Social History Narrative  . Not on file    REVIEW OF SYSTEMS: Constitutional: Generally no weakness or fatigue.  Today she did have the syncopal episode ENT:  No nose bleeds Pulm: No shortness of breath.  No cough. CV:  No palpitations, + LE edema.  No chest pain. GU:  No hematuria, no frequency GI: See HPI. Heme: See HPI. Transfusions: Received PRBC transfusions in 2012 and on 3 occasions in 2016 Neuro: Syncope this morning.  No headaches, no peripheral tingling or numbness Derm:  No itching, no rash or sores.  Endocrine:  No sweats or chills.  No polyuria or dysuria Immunization: Reviewed. Travel:  None beyond local counties in last few months.    PHYSICAL EXAM: Vital signs in last 24 hours: Vitals:   05/17/19 1108  BP: (!) 123/91  Pulse: 100  Resp:  16  Temp: 98.3 F (36.8 C)  SpO2: 100%   Wt Readings from Last 3 Encounters:  05/17/19 90.7 kg  04/28/19 95.3 kg  04/10/19 97.2 kg    General: Pleasant, alert, comfortable, well-appearing, overweight Head: No facial asymmetry or swelling.  No signs of head trauma. Eyes: No scleral icterus.  No conjunctival pallor.  EOMI. Ears: Not hard of hearing. Nose: No congestion or discharge. Mouth: Is, clear, pink oral mucosa.  Tongue midline. Neck: No JVD, no masses, no thyromegaly. Lungs: No labored breathing or cough.  Lungs clear bilaterally with good breath sounds. Heart: RRR.  No MRG.  S1, S2 present Abdomen: Soft.  Not tender, not distended.  No HSM, masses, bruits, hernias.  Active bowel sounds..   Rectal: Burgundy pastelike stool on DRE.  No masses. Musc/Skeltl: No joint redness, swelling or gross deformity. Extremities: No CCE. Neurologic: Pleasant, cooperative.  Fully alert and oriented.  No limb weakness.  No tremors. Skin: No rashes, no sores, no signs of trauma. Nodes:  No cervical adenopathy. Psych: Pleasant, cooperative, fluid speech.  LAB RESULTS:  CBC Latest Ref Rng & Units 05/17/2019 04/28/2019 03/22/2019  WBC 4.0 - 10.5 K/uL 8.8 7.6 6.6  Hemoglobin 12.0 - 15.0 g/dL 7.6(L) 10.0(L) 9.6(L)  Hematocrit 36.0 - 46.0 % 25.5(L) 32.7(L) 30.5(L)  Platelets 150 - 400 K/uL 229 243 275    BMET Lab Results  Component Value Date   NA 141 05/17/2019   NA 141 04/28/2019   NA 145 (H) 04/10/2019   K 3.6 05/17/2019   K 3.0 (L) 04/28/2019   K 3.8 04/10/2019   CL 110 05/17/2019   CL 106 04/28/2019   CL 105 04/10/2019   CO2 20 (L) 05/17/2019   CO2 25 04/28/2019   CO2 25 04/10/2019   GLUCOSE 157 (H) 05/17/2019   GLUCOSE 74 04/28/2019   GLUCOSE 96 04/10/2019   BUN 30 (H) 05/17/2019   BUN 15 04/28/2019   BUN 19 04/10/2019   CREATININE 0.93 05/17/2019   CREATININE 0.80 04/28/2019   CREATININE 0.92 04/10/2019   CALCIUM 8.8 (L) 05/17/2019   CALCIUM 9.2 04/28/2019   CALCIUM 9.4 04/10/2019   LFT Recent Labs    05/17/19 1114  PROT 5.8*  ALBUMIN 3.1*  AST 22  ALT 19  ALKPHOS 52  BILITOT 0.4    Lab Results  Component Value Date   FERRITIN 25 11/17/2017     IMPRESSION:   *     GI bleed.  Long history of recurrent diverticular bleeds.  Ultimately underwent subtotal colectomy with ileorectal anastomosis in 11/2015.  Very unlikely that this represents a diverticular bleed.  If she were bleeding from her rectum the stool should be very bright red, but on exam and by stated history this is burgundy stool.  BUN is slightly elevated which also suggests an upper GI source.  Her latest esophagram was in 2016 which showed ulceration at the anastomosis for her Roux-en-Y bypass and a fundic gland polyp.  Could this be peptic ulcer disease or bleeding from a recurrent upper GI anastomotic ulcer?  *   Syncope earlier today with orthostasis in setting of GI blood loss.  Currently vital signs stable.  Patient looks and feels well.  *     Blood loss anemia. Hgb 10 to  7.6 over 3 weeks.  Currently receiving 1 of 1 PRBCs  *      Post-menopausal vaginal bleeding - ? Of rectovaginal fistula raised   PLAN:     *  Start Protonix drip.  Finish the unit of blood.  Upper endoscopy arranged for about 3:00 today with Dr. Ardelle Balls  05/17/2019, 12:42 PM Phone [phone]    Max Attending   I have taken an interval history, reviewed the chart and examined the patient. I agree with the Advanced Practitioner's note, impression and recommendations.   Will plan for possible unprepped flex sig also  Once endoscopic eval done will order the CT pelvis she was going to get as an outpatient to evaluate the ? Of vaginal bleeding and RV fistula  The risks and benefits as well as alternatives of endoscopic procedure(s) have been discussed and reviewed. All questions answered. The patient agrees to proceed.   Gatha Mayer, MD, [facility] 05/17/2019 3:10 PM Pager [phone]

## 2019-05-17 NOTE — ED Notes (Signed)
Report called to Norva Pavlov RN

## 2019-05-17 NOTE — H&P (Addendum)
[facility] Admission History and Physical Service Pager: [phone]  [name] Medical record number: [id number] Date of birth: 1956-03-05 Age: 62 y.o. Gender: female  Primary Care Provider: McDiarmid, Blane Ohara, MD Consultants: GI Code Status: full Preferred Emergency Contact: marcus Wiest, son  Chief Complaint: weakness, rectal bleeding.  Assessment and Plan: [name] is a 63 y.o. female presenting with symptomatic anaemia secondary to a GI bleed . PMH is significant for diverticulosis of colon with haemorrhage, subtotal colectomy, gastric bypass, OA of bilateral knees, HLD, GERD, stroke.   Symptomatic anaemia secondary to GI bleed- symptomatic since this am with nausea, weakness, and hematochezia/melena. Complex GI history with gastric bipass (Roux-en-Y)  and subtotal colonoscopy with ileorectal anastomosis (2017). The top differentials for her rectal bleeding include bleeding diverticulosis and malignancy. Other less likely differentials include haemorrhoids and inflammatory bowel disease. Cannot rule out bleeding ulcer or upper GI bleed. Patient had hysterectomy previously so unlikely vaginal bleed but patient had recently been worked up for possible recto-vaginal fistula. She denied weight loss, change in bowel habit or anorexia which would make malignancy a less likely source of her bleeding but cannot be ruled out because of her father with colon cancer. Inflammatory bowel disease could also cause rectal bleeding but less likely given her lack of diarrhea and abdominal pain. Hemorrhoids are also less likely given the severity and acuity of this patient's condition. Patient was hemodynamically stable on admission with systolic BPs in 732K and HR 100. Initial Hgb was 7.6 (10 ~3 weeks ago) and appeared weak and pale. She was started on 1u pRBCs in the ED with improvement in symptoms. Repeat hgb was 9.1. She also received NS bolus with maintenance  fluids while in the ED. GI was consulted and prepared patient for an urgent endoscopy procedure.  -Admit to med surg-Dr Erin Hearing -GI consult, appreciate recs -NPO for endoscopy and/or colonoscopy  -Vitals every 4 hours -Monitor for rectal bleeding -IV protonix -IV fluids while NPO -Post H&H -Daily BMP and CBC -Monitor Hb daily, threshold is 7 -SCDs for DVT prophylaxis  - iron supplement  - sucralfate - fall precautions - follow up pelvic CT- ordered by GI for evaluation of possible RV fistula  HLD Home meds: 40 mg Atorvastatin. Last lipid panel 06/2018 showed total cholesterol 143, HDL 89, LDL 45 -continue home med after endoscopic procedures    HTN Home meds: Furosemide 40mg  once daily, HCZT 12.5mg  once daily, Lisinopril 20mg  once daily BP 118/81 -hold prior to endoscopic procedures, then restart if BP is tolerating  Iron deficiency anaemia- iron studies in 11/2017 showed iron 25, TIBC 531, ferritin 25 Home meds: ferrous sulphate 220mg  3 times daily -Order new iron studies  - feraheme x1 - PO iron daily  Hx of stroke-stable No evidence of residual weakness  GERD Home meds: 40mg  Protinix (note from GI- recommend pantoprazole IV, capsules or dissolvable or powder as tablets do not absorb well for gastric bypass patients) -continue IV Protinix - continue home carafate   Hypokalemia Home meds: Potassium chloride 7meq once daily. May need to supplement once restarting home BP medications. -monitor K with BMP am  Nutrition Home meds: vitamin B-12, Vitamin C, Vitamin A & D -Continue   Insomnia  Home meds: Zolpidem 5mg   -continue   Back pain Tramadol 50mg  Q8PRN -continue  FEN/GI: NPO, IV Protonix Prophylaxis: SCDs  Disposition: pending resolution of GI bleed  History of Present Illness:  [name] is a 63 y.o.  female presenting with in total 4 episodes of rectal bleeding (maroon coloured stools) which started at 4 am today.  It was sudden onset bleed. It  look longer to get ready for work today as she was feeling very dizzy after these bowel movements. 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 She therefore attended urgent care where her initial BP was 60/30. She was nauseous but did not vomit. Soon after she had a "swimming head" and a syncopal episode. After regaining consciousness she was transferred to the ED. Denies chest pain, cough, dyspnea, sore throat, cough or fever. Denies hematemesis, stomach pain, change in bowel habit, denies dysuria, haematuria. Denies weight loss.   Reports that she had her colon removed 5-6 years ago secondary to diverticulosis. Had colostomy for a few days n the hospital at [facility] Then they reversed it. Pt also has a hx of bariatric surgery. No other GI problems since then.   In ED her Hb was 7.6, WBC 8.8, Na 141, K 3.6 creat 0.93, BUN 30. She was given 1 unit RBC along with IV fluids. She was consulted by GI who started her on IV Protonix and ordered a stat endoscopy.   Review Of Systems: Per HPI with the following additions:  Review of Systems  Musculoskeletal: Negative for falls.    Patient Active Problem List   Diagnosis Date Noted  . Vaginal bleeding 05/17/2019  . Lower GI bleed   . Diverticulosis of colon with hemorrhage   . 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 Peripheral neuropathy 05/20/2017  . Esophageal dysmotility 02/14/2017  . Venous stasis ulcer of left ankle with fat layer exposed with varicose veins ([place]) 10/20/2016  . DDD (degenerative disc disease), lumbar 04/14/2016  . Osteoarthritis of both knees 03/12/2016  . Blood in stool 10/07/2014  . History of stroke 02/13/2014  . Hyperlipidemia 02/13/2014  . Eczema 05/21/2013  . Insomnia 08/29/2012  . GERD (gastroesophageal reflux disease) 08/24/2012  . Asthma, cough variant 03/10/2012  . Hypertension, benign  essential, goal below 140/90 01/05/2012  . Allergic rhinitis 05/27/2010  . Morbid obesity ([place]) 12/29/2006  . Iron deficiency anemia 12/29/2006  . Venous (peripheral) insufficiency 12/29/2006    Past Medical History: Past Medical History:  Diagnosis Date  . AC (acromioclavicular) joint arthritis 12/23/2017   Left side  . Acute renal failure ([place]) 09/24/2016  . Anemia   . Arthritis    knees  . Back pain 10/04/2012  . Biliary sludge 04/08/2014   Taking ursodiol chronically    . Blood in stool 10/07/2014  . Blood transfusion without reported diagnosis   . Bunion 03/28/2014  . Cellulitis of leg, left 09/14/2016  . Cervical pain (neck) 03/26/2016  . Chest pain 05/12/2015  . Constipation 10/19/2013   Chronic in the setting of known diverticulosis and history of bleeds.    . Cramping of hands 02/27/2016  . CYST, KIDNEY, ACQUIRED 08/15/2007   Qualifier: Diagnosis of  By: Mellody Drown MD, [facility]    . 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 Degenerative arthritis of right shoulder region 08/2013  . Diverticulosis   . Diverticulosis of colon with hemorrhage    Hx  Of recurrent Diverticular bleeding - several prior admissions   . Diverticulosis of colon without hemorrhage 11/18/2014   Noted on colonoscopy 11/2014   .  External hemorrhoids without complication 1/61/0960  . GERD (gastroesophageal reflux disease)   . High cholesterol   . History of GI bleed   . History of stroke 02/13/2014   Overview:  Dx on MRI when eval for h/a but denies any sxs  . Hypertension    under control with meds., has been on med. > 20 yr.  . Hypopigmented skin lesion 02/23/2011  . Ileus following gastrointestinal surgery ([place]) 12/12/2015  . Intractable vomiting 09/24/2016  . Leg cramping 04/22/2014  . Lipoma of lower extremity 03/07/2013   Overview:  RIGHT THIGH  . Lower GI bleed   . Lumbar back pain 03/26/2016  . Medial meniscus tear 1997   Right Knee  . Mini  stroke ([place])   . Morbid obesity ([place]) 12/29/2006  . Multiple open wounds of lower leg 11/12/2016  . Muscle pain 05/26/2015  . Premature supraventricular beats 12/25/2015   EKG 12/25/15   . Rash and nonspecific skin eruption 04/01/2017  . Rotator cuff rupture, complete 08/2013   right  . Shoulder impingement 08/2013   right  . Skin lesion of right lower extremity 05/27/2016  . Small bowel obstruction ([place]) 12/12/2015  . Snoring 08/24/2018  . Stroke [facility]) 2006   Remote left lacunar infarct noted on CT head 2006, 2010   . Subacromial bursitis 05/21/2013  . Superficial thrombophlebitis 01/29/2016  . Tingling 11/17/2017  . Ulcer   . Wears dentures    upper  . Wears partial dentures    lower    Past Surgical History: Past Surgical History:  Procedure Laterality Date  . ABDOMINAL HYSTERECTOMY  ?1987   partial  . COLONOSCOPY  05/02/2000; 05/08/2013  . COSMETIC SURGERY  02/22/2014   skin removal surgery from lower abdomen   . ENDOVENOUS ABLATION SAPHENOUS VEIN W/ LASER Right 08/12/2016   endovenous laser ablation right greater saphenous vein by Curt Jews MD   . ENDOVENOUS ABLATION SAPHENOUS VEIN W/ LASER Left 10/21/2016   EVLA L GSV by Curt Jews MD  . METATARSAL OSTEOTOMY Right 1995   Right Bunion Repair  . ROTATOR CUFF REPAIR Left 04/27/2018  . 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 SHOULDER ARTHROSCOPY WITH ROTATOR CUFF REPAIR AND SUBACROMIAL DECOMPRESSION Right 08/09/2013   Procedure: RIGHT SHOULDER ARTHROSCOPY WITH SUBACROMIAL DECOMPRESSION, DISTAL CLAVICLE EXCISION AND ROTATOR CUFF REPAIR and release biceps;  Surgeon: Ninetta Lights, MD;  Location: [facility];  Service: Orthopedics;  Laterality: Right;  . SUBTOTAL COLECTOMY  11/24/15   [place] Left   . TOTAL KNEE ARTHROPLASTY Right     Social History: Social History   Tobacco Use  . Smoking status: Former Smoker    Packs/day: 0.10    Years: 6.00    Pack years: 0.60    Quit date:  1976    Years since quitting: 44.5  . Smokeless tobacco: Never Used  Substance Use Topics  . Alcohol use: Yes    Alcohol/week: 0.0 standard drinks    Comment: occasional wine  . Drug use: No   Additional social history:  Lives with sister who she cares for. Mental Former smoker. For about 1.5 years.  Not smoked for several years.  Has 1 son  Part time work in day care  Please also refer to relevant sections of EMR.  Family History: Family History  Problem Relation Age of Onset  . Cancer Father 12       Died from complications of colon CA  . Colon  cancer Father        died in his 44s  . Diabetes Mother   . Hypertension Mother   . Diabetes Sister   . Heart disease Sister   . Diabetes Sister   . Stroke Sister   . Diabetes Brother   . Stroke Brother   . Diabetes Brother   . Diabetes Brother   . Diabetes Brother   . Hypertension Son     Allergies and Medications: Allergies  Allergen Reactions  . Aspirin Other (See Comments)    CAN NOT TAKE DUE TO GI BLEED GI BLEED  . Nsaids Other (See Comments)    GI BLEED  . Tolmetin Other (See Comments)    GI BLEED   No current facility-administered medications on file prior to encounter.    Current Outpatient Medications on File Prior to Encounter  Medication Sig Dispense Refill  . aspirin 81 MG tablet Take 81 mg by mouth daily.    Marland Kitchen atorvastatin (LIPITOR) 40 MG tablet TAKE ONE TABLET DAILY 90 tablet 1  . benzonatate (TESSALON) 100 MG capsule Take 2 capsules (200 mg total) by mouth 3 (three) times daily as needed for cough. 20 capsule 0  . calcium carbonate (OS-CAL) 600 MG TABS tablet Take 1 tablet (600 mg total) by mouth 2 (two) times daily with a meal. 180 tablet 1  . cetirizine (ZYRTEC) 10 MG tablet Take 1 tablet (10 mg total) by mouth daily. (Patient taking differently: Take 10 mg by mouth daily as needed for allergies. ) 30 tablet 11  . docusate sodium (DOK) 100 MG capsule TAKE ONE CAPSULE TWICE A DAY AS NEEDED FOR  CONSTIPATION 180 capsule 1  . ferrous sulfate 220 (44 Fe) MG/5ML solution Take 5 mLs (220 mg total) by mouth 3 (three) times daily with meals. 473 mL 2  . fluticasone (FLONASE) 50 MCG/ACT nasal spray TWO SPRAYS IN EACH NOSTRIL AT BEDTIME 16 g 1  . furosemide (LASIX) 40 MG tablet TAKE ONE TABLET DAILY 30 tablet 2  . hydrochlorothiazide (MICROZIDE) 12.5 MG capsule TAKE ONE CAPSULE EACH DAY 90 capsule 1  . lisinopril (PRINIVIL,ZESTRIL) 20 MG tablet Take 1 tablet (20 mg total) by mouth daily. 90 tablet 1  . Multiple Vitamin (MULTIVITAMIN) tablet Take 1 tablet by mouth daily. 90 tablet 1  . pantoprazole (PROTONIX) 40 MG tablet Take 1 tablet (40 mg total) by mouth daily. 90 tablet 1  . polyethylene glycol powder (GLYCOLAX/MIRALAX) powder Take 17 g by mouth 2 (two) times daily as needed. 3350 g 1  . SANTYL ointment Apply topically daily. To legs daily 30 g 2  . sucralfate (CARAFATE) 1 GM/10ML suspension TAKE 17ml (2 TEASPOONSFUL) BY MOUTH 4 TIMES DAILY WITH MEALS AND ATBEDTIME 420 mL 2  . traMADol (ULTRAM) 50 MG tablet Take 1 tablet (50 mg total) by mouth every 8 (eight) hours as needed. 30 tablet 0  . triamcinolone ointment (KENALOG) 0.5 % Apply 1 application topically 2 (two) times daily. 30 g 1  . vitamin B-12 (CYANOCOBALAMIN) 1000 MCG tablet Take 1,000 mcg by mouth daily.    . vitamin C (ASCORBIC ACID) 250 MG tablet Take 250 mg by mouth daily.    . Vitamins A & D 5000-400 units CAPS Take 5,000 Units by mouth daily.    Marland Kitchen zolpidem (AMBIEN) 5 MG tablet TAKE ONE TABLET AT BEDTIME AS NEEDED FORSLEEP 30 tablet 1    Objective: BP 122/84   Pulse 95   Temp 98.1 F (36.7 C) (Oral)  Resp 18   Ht 5\' 4"  (1.626 m)   Wt 90.7 kg   SpO2 100%   BMI 34.33 kg/m  Exam: General: looks well, alert and cooperative. No acute distress Eyes: normal extraocular movements, moist mucous membranes, pale conjunctiva and sclera, no jaundice  ENTM: normal pharynx, no erythematous or exudative tonsils. Upper dentures  present. No blood seen in oropharynx. Neck: Neck non-tender without lymphadenopathy, masses or thyromegaly Cardiovascular: normal S1 and S2, no s3 or s4. Regular rhythm. No murmurs or rubs. Respiratory: chest clear to auscultate bilaterally, no crackles of wheezing or diminished breath sounds. Normal respiratory effort. Gastrointestinal: increase BMI, multiple surgical scars on abdomin including lower horizontal surgical scar, abdo soft non tender, no guarding. No organomegaly MSK: Bilateral knee replacement scars, no peripheral edema Derm: normal skin turgor, no evidence of infection, bruises, Neuro: Cranial nerves grossly in tact  Psych: normal mood, normal affect  Labs and Imaging: CBC BMET  Recent Labs  Lab 05/17/19 1114  WBC 8.8  HGB 7.6*  HCT 25.5*  PLT 229   Recent Labs  Lab 05/17/19 1114  NA 141  K 3.6  CL 110  CO2 20*  BUN 30*  CREATININE 0.93  GLUCOSE 157*  CALCIUM 8.8Richarda Osmond, DO 05/17/2019, 1:38 PM PGY-2, [place] Intern pager: [phone], text pages welcome

## 2019-05-17 NOTE — ED Notes (Signed)
Pt transferred to ENDO

## 2019-05-17 NOTE — ED Notes (Signed)
EMS at bedside

## 2019-05-18 ENCOUNTER — Encounter (HOSPITAL_COMMUNITY): Payer: Self-pay | Admitting: Family Medicine

## 2019-05-18 DIAGNOSIS — T849XXA Unspecified complication of internal orthopedic prosthetic device, implant and graft, initial encounter: Secondary | ICD-10-CM

## 2019-05-18 DIAGNOSIS — Z96659 Presence of unspecified artificial knee joint: Secondary | ICD-10-CM | POA: Insufficient documentation

## 2019-05-18 DIAGNOSIS — D500 Iron deficiency anemia secondary to blood loss (chronic): Secondary | ICD-10-CM

## 2019-05-18 DIAGNOSIS — I9589 Other hypotension: Secondary | ICD-10-CM

## 2019-05-18 DIAGNOSIS — Z8719 Personal history of other diseases of the digestive system: Secondary | ICD-10-CM | POA: Insufficient documentation

## 2019-05-18 DIAGNOSIS — K289 Gastrojejunal ulcer, unspecified as acute or chronic, without hemorrhage or perforation: Secondary | ICD-10-CM

## 2019-05-18 DIAGNOSIS — D62 Acute posthemorrhagic anemia: Secondary | ICD-10-CM

## 2019-05-18 DIAGNOSIS — K922 Gastrointestinal hemorrhage, unspecified: Secondary | ICD-10-CM

## 2019-05-18 DIAGNOSIS — T85898A Other specified complication of other internal prosthetic devices, implants and grafts, initial encounter: Secondary | ICD-10-CM

## 2019-05-18 DIAGNOSIS — K284 Chronic or unspecified gastrojejunal ulcer with hemorrhage: Principal | ICD-10-CM

## 2019-05-18 HISTORY — DX: Unspecified complication of internal orthopedic prosthetic device, implant and graft, initial encounter: Z96.659

## 2019-05-18 HISTORY — DX: Unspecified complication of internal orthopedic prosthetic device, implant and graft, initial encounter: T84.9XXA

## 2019-05-18 HISTORY — DX: Personal history of other diseases of the digestive system: Z87.19

## 2019-05-18 HISTORY — DX: Gastrojejunal ulcer, unspecified as acute or chronic, without hemorrhage or perforation: K28.9

## 2019-05-18 HISTORY — DX: Other specified complication of other internal prosthetic devices, implants and grafts, initial encounter: T85.898A

## 2019-05-18 LAB — TYPE AND SCREEN
ABO/RH(D): O POS
Antibody Screen: NEGATIVE
Unit division: 0

## 2019-05-18 LAB — CBC
HCT: 23.6 % — ABNORMAL LOW (ref 36.0–46.0)
HCT: 22.8 % — ABNORMAL LOW (ref 36.0–46.0)
HCT: 23.9 % — ABNORMAL LOW (ref 36.0–46.0)
Hemoglobin: 7.6 g/dL — ABNORMAL LOW (ref 12.0–15.0)
Hemoglobin: 7.6 g/dL — ABNORMAL LOW (ref 12.0–15.0)
Hemoglobin: 7.5 g/dL — ABNORMAL LOW (ref 12.0–15.0)
MCH: 27.7 pg (ref 26.0–34.0)
MCH: 27.8 pg (ref 26.0–34.0)
MCH: 27.8 pg (ref 26.0–34.0)
MCHC: 32.2 g/dL (ref 30.0–36.0)
MCHC: 33.3 g/dL (ref 30.0–36.0)
MCHC: 31.4 g/dL (ref 30.0–36.0)
MCV: 86.1 fL (ref 80.0–100.0)
MCV: 83.5 fL (ref 80.0–100.0)
MCV: 88.5 fL (ref 80.0–100.0)
Platelets: 198 10*3/uL (ref 150–400)
Platelets: 174 10*3/uL (ref 150–400)
Platelets: 199 10*3/uL (ref 150–400)
RBC: 2.74 MIL/uL — ABNORMAL LOW (ref 3.87–5.11)
RBC: 2.73 MIL/uL — ABNORMAL LOW (ref 3.87–5.11)
RBC: 2.7 MIL/uL — ABNORMAL LOW (ref 3.87–5.11)
RDW: 19.3 % — ABNORMAL HIGH (ref 11.5–15.5)
RDW: 19.2 % — ABNORMAL HIGH (ref 11.5–15.5)
RDW: 19.8 % — ABNORMAL HIGH (ref 11.5–15.5)
WBC: 8.6 10*3/uL (ref 4.0–10.5)
WBC: 7.9 10*3/uL (ref 4.0–10.5)
WBC: 7.6 10*3/uL (ref 4.0–10.5)
nRBC: 0 % (ref 0.0–0.2)
nRBC: 0 % (ref 0.0–0.2)
nRBC: 0.3 % — ABNORMAL HIGH (ref 0.0–0.2)

## 2019-05-18 LAB — BASIC METABOLIC PANEL
Anion gap: 9 (ref 5–15)
BUN: 17 mg/dL (ref 8–23)
CO2: 22 mmol/L (ref 22–32)
Calcium: 8.7 mg/dL — ABNORMAL LOW (ref 8.9–10.3)
Chloride: 108 mmol/L (ref 98–111)
Creatinine, Ser: 0.73 mg/dL (ref 0.44–1.00)
GFR calc Af Amer: >60 mL/min (ref >60)
GFR calc non Af Amer: >60 mL/min (ref >60)
Glucose, Bld: 125 mg/dL — ABNORMAL HIGH (ref 70–99)
Potassium: 3.4 mmol/L — ABNORMAL LOW (ref 3.5–5.1)
Sodium: 139 mmol/L (ref 135–145)

## 2019-05-18 LAB — HIV ANTIBODY (ROUTINE TESTING W REFLEX): HIV Screen 4th Generation wRfx: NONREACTIVE

## 2019-05-18 LAB — BPAM RBC
Blood Product Expiration Date: 202008072359
ISSUE DATE / TIME: 202007161239
Unit Type and Rh: 5100

## 2019-05-18 LAB — HEMOGLOBIN AND HEMATOCRIT, BLOOD
HCT: 23.5 % — ABNORMAL LOW (ref 36.0–46.0)
Hemoglobin: 7.6 g/dL — ABNORMAL LOW (ref 12.0–15.0)

## 2019-05-18 LAB — FERRITIN: Ferritin: 48 ng/mL (ref 11–307)

## 2019-05-18 MED ORDER — POTASSIUM CHLORIDE CRYS ER 20 MEQ PO TBCR
40.0000 meq | EXTENDED_RELEASE_TABLET | Freq: Once | ORAL | Status: AC
  Administered 2019-05-18: 40 meq via ORAL
  Filled 2019-05-18: qty 2

## 2019-05-18 NOTE — Progress Notes (Addendum)
Patient admitted for recent episodes of maroon-colored stools. Noted midnight H&H returned with a hemoglobin of 7.6 with CBC confirming this.  After receiving 1 unit PRBCs (also received 1 dose fereheme), H&H increased to 9.1 around 5 PM from 7.6 prior to.   Called RN to discuss these results.  States patient has not had any bowel movement, vaginal bleeding, hematuria, N/V, or abdominal pain since admit.  Patient reportedly feeling better, just walked back and forth from her restroom without concern and now trying to sleep.  Vitals stable, no current tachycardia or hypotension.   Question whether this was an acute drop due to further blood loss versus hemodilution after receiving significant fluid resuscitation with initial presenting dehydration.  Upper endoscopy already performed by GI today, showed nonbleeding ulcer. Preparing for CT pelvis and unprepped flexible sigmoidoscopy later this morning.  Given current stability with no further bleeding since admit, we will recheck CBC in 2 hours, but potentially prepare for likely transfusion of another unit pRBC pending results. Threshold <7, however will also monitor symptomatology and vitals closely.  Patriciaann Clan, DO

## 2019-05-18 NOTE — Progress Notes (Addendum)
Daily Rounding Note  05/18/2019, 12:16 PM  LOS: 1 day   SUBJECTIVE:   Chief complaint:   GI bleed from anastomotic ulcer.  Hypertension yesterday, now resolved.  No tachycardia.  Excellent oxygen saturation. No BM's since PTA until this AM when stool still liquid, burgundy, bloody.   A little bit dizzy ~ 11 AM today when she went to bathroom to pee.  No nausea or abd pain.  She is hungry on just clears.   No rapid heart rate or chest pain.  No dyspnea.   Feels better.    OBJECTIVE:         Vital signs in last 24 hours:    Temp:  [98.1 F (36.7 C)-98.9 F (37.2 C)] 98.4 F (36.9 C) (07/17 0938) Pulse Rate:  [87-101] 87 (07/17 0938) Resp:  [14-28] 18 (07/17 0938) BP: (111-196)/(66-110) 132/72 (07/17 0938) SpO2:  [98 %-100 %] 99 % (07/17 0938) Last BM Date: 05/17/19 Filed Weights   05/17/19 1110  Weight: 90.7 kg   General: pleasant.  Looks well   Heart: RRR Chest: clear bil.   Abdomen:  Soft, NT, ND.  Active BS  Extremities: no CCE Neuro/Psych:  Oriented x 3.  Fully alert.  No gross deficits, no weakness.    Intake/Output from previous day: 07/16 0701 - 07/17 0700 In: 1494.9 [P.O.:120; I.V.:444.9; Blood:630; IV Piggyback:300] Out: 0    Lab Results: Recent Labs    05/17/19 1726 05/18/19 0018 05/18/19 0020 05/18/19 0530  WBC 9.8  --  8.6 7.9  HGB 9.1* 7.6* 7.6* 7.6*  HCT 28.6* 23.5* 23.6* 22.8*  PLT 213  --  198 174   BMET Recent Labs    05/17/19 1114 05/18/19 0020  NA 141 139  K 3.6 3.4*  CL 110 108  CO2 20* 22  GLUCOSE 157* 125*  BUN 30* 17  CREATININE 0.93 0.73  CALCIUM 8.8* 8.7*   LFT Recent Labs    05/17/19 1114  PROT 5.8*  ALBUMIN 3.1*  AST 22  ALT 19  ALKPHOS 52  BILITOT 0.4   Scheduled Meds: . atorvastatin  40 mg Oral q1800  . ferrous sulfate  325 mg Oral Q breakfast  . sucralfate  1 g Oral TID WC & HS   Continuous Infusions: . pantoprozole (PROTONIX) infusion 8 mg/hr  (05/18/19 0343)  . sodium chloride     Followed by  . sodium chloride     PRN Meds:.zolpidem   ASSESMENT:   *GI bleed, burgundy stool. 05/17/2019 EGD.  None bleeding gastric ulcer at gastrojejunal anastomosis.  Treated with injection and endoclips. Carafate, IV Protonix gtt in place.  *    Blood loss anemia.  Hgb 7.6 >> I U PRBC >>  9.1 >> 7.6.   Not po oral iron in place.  Taking this BID, PTA as well.      *    History of recurrent diverticular bleeds, status post subtotal colectomy with ileo-rectal anastomosis 11/2015.  No significant LGI bleeding since.   PLAN   *   Will need to empty contents of PPI capsule or dissolvable PPI formulation going forward as Pantoprazole tablets not absorbed well in post gastric bypass patient.  *   Continue IV Protonix and carafate for now.    *   CBC at 5 PM and in AM.    *   Defer decision to advance diet to Dr Carlean Purl.      Azucena Freed  05/18/2019, 12:16 PM Phone [place] Attending   I have taken an interval history, reviewed the chart and examined the patient. I agree with the Advanced Practitioner's note, impression and recommendations.   Appears stable to improved  Thinking the bleeding she has had was not active bleeding but passage of blood from pre-EGd - though not sure. Will order soft diet  F/U labs and clinically.  She could need a repeat EGD - or lower endoscopic exam though the ulcer with visible vessel can explain all of this.  If she does well tomorrow we can think about dc Sunday - if not will regroup.  Gatha Mayer, MD, [facility] 05/18/2019 4:12 PM Pager [phone]

## 2019-05-18 NOTE — Progress Notes (Signed)
Family Medicine Teaching Service Daily Progress Note Intern Pager: [phone]  [name] Medical record number: [id number] Date of birth: [birth year] Age: 63 y.o. Gender: female  Primary Care Provider: McDiarmid, Blane Ohara, MD Consultants: GI Code Status: Full  Pt Overview and Major Events to Date:  07/16- admitted 07/16- egd  Assessment and Plan:  [name] is a 63 y.o. female presenting with symptomatic anaemia secondary to a GI bleed . PMH is significant for diverticulosis of colon with haemorrhage, subtotal colectomy, gastric bypass, OA of bilateral knees, HLD, GERD, stroke.   Symptomatic anaemia secondary to GI bleed-- S/p EGD non bleeding gastric ulcer with non bleeding vessels Asymptomatic Hbg 7.6, repeat Hbg 7.5  Monitor Hbg daily Monitor for rectal bleeding -IV protonix until po -IV fluids until po SCDs for DVT prophylaxis    HLD chronic Home meds: 40 mg Atorvastatin.   HTN Home meds: Furosemide 40mg  once daily, HCZT 12.5mg  once daily, Lisinopril 20mg  once daily BP 118/81 -hold prior to endoscopic procedures, then restart if BP is tolerating  Iron deficiency anaemia- chronic FESO4 325mg  daily  Hx of stroke-stable No evidence of residual weakness  GERD chronic Asymptomatic   Hypokalemia Home meds: Potassium chloride 27meq once daily. K 3.4 this am -monitor K with BMP am replete K prn  Nutrition Home meds: vitamin B-12, Vitamin C, Vitamin A & D -Continue   Insomnia  Home meds: Zolpidem 5mg   monitor  Back pain asymptomatic   FEN/GI:clear fluids [id number]  Disposition: awaiting GI recs, home when medically stable  Subjective:  Pt seen and assessed. Feels good, denies any chest pain, SOB or abdo pain. She reports no signs of bleeding.  On clear liquids.  Voiding well. No new issues.  Objective: Temp:  [98.1 F (36.7 C)-98.9 F (37.2 C)] 98.2 F (36.8 C) (07/16 2123) Pulse Rate:  [84-101] 94 (07/17  0304) Resp:  [14-28] 17 (07/16 2123) BP: (60-196)/(30-110) 111/69 (07/17 0304) SpO2:  [98 %-100 %] 99 % (07/17 0304) Weight:  [90.7 kg] 90.7 kg (07/16 1110)   General: Alert and oriented, no apparent distress  Cardiovascular: RRR with no murmurs noted Respiratory: CTA bilaterally  Gastrointestinal: Bowel sounds present. No abdominal pain MSK: Upper extremity strength 5/5 bilaterally, Lower extremity strength 5/5 bilaterally, minimal bilateral lower extremity edema Psych: Behavior and speech appropriate to situation  Laboratory: Recent Labs  Lab 05/17/19 1114 05/17/19 1726 05/18/19 0018 05/18/19 0020  WBC 8.8 9.8  --  8.6  HGB 7.6* 9.1* 7.6* 7.6*  HCT 25.5* 28.6* 23.5* 23.6*  PLT 229 213  --  198   Recent Labs  Lab 05/17/19 1114 05/18/19 0020  NA 141 139  K 3.6 3.4*  CL 110 108  CO2 20* 22  BUN 30* 17  CREATININE 0.93 0.73  CALCIUM 8.8* 8.7*  PROT 5.8*  --   BILITOT 0.4  --   ALKPHOS 52  --   ALT 19  --   AST 22  --   GLUCOSE 157* 125*      Imaging/Diagnostic Tests:   Carollee Leitz, MD 05/18/2019, 5:38 AM PGY-1, [place] Intern pager: [phone], text pages welcome

## 2019-05-19 ENCOUNTER — Other Ambulatory Visit: Payer: Self-pay | Admitting: Internal Medicine

## 2019-05-19 ENCOUNTER — Inpatient Hospital Stay (HOSPITAL_COMMUNITY): Payer: Medicare HMO

## 2019-05-19 DIAGNOSIS — K284 Chronic or unspecified gastrojejunal ulcer with hemorrhage: Secondary | ICD-10-CM

## 2019-05-19 LAB — BASIC METABOLIC PANEL
Anion gap: 6 (ref 5–15)
BUN: 7 mg/dL — ABNORMAL LOW (ref 8–23)
CO2: 24 mmol/L (ref 22–32)
Calcium: 8.8 mg/dL — ABNORMAL LOW (ref 8.9–10.3)
Chloride: 112 mmol/L — ABNORMAL HIGH (ref 98–111)
Creatinine, Ser: 0.7 mg/dL (ref 0.44–1.00)
GFR calc Af Amer: >60 mL/min (ref >60)
GFR calc non Af Amer: >60 mL/min (ref >60)
Glucose, Bld: 96 mg/dL (ref 70–99)
Potassium: 4.1 mmol/L (ref 3.5–5.1)
Sodium: 142 mmol/L (ref 135–145)

## 2019-05-19 LAB — CBC
HCT: 23.5 % — ABNORMAL LOW (ref 36.0–46.0)
Hemoglobin: 7.3 g/dL — ABNORMAL LOW (ref 12.0–15.0)
MCH: 28 pg (ref 26.0–34.0)
MCHC: 31.1 g/dL (ref 30.0–36.0)
MCV: 90 fL (ref 80.0–100.0)
Platelets: 184 10*3/uL (ref 150–400)
RBC: 2.61 MIL/uL — ABNORMAL LOW (ref 3.87–5.11)
RDW: 19.9 % — ABNORMAL HIGH (ref 11.5–15.5)
WBC: 6.8 10*3/uL (ref 4.0–10.5)
nRBC: 0.4 % — ABNORMAL HIGH (ref 0.0–0.2)

## 2019-05-19 MED ORDER — IOHEXOL 300 MG/ML  SOLN
100.0000 mL | Freq: Once | INTRAMUSCULAR | Status: AC | PRN
  Administered 2019-05-19: 100 mL via INTRAVENOUS

## 2019-05-19 MED ORDER — FERROUS SULFATE 300 (60 FE) MG/5ML PO SYRP
300.0000 mg | ORAL_SOLUTION | Freq: Two times a day (BID) | ORAL | Status: DC
  Administered 2019-05-20 (×2): 300 mg via ORAL
  Filled 2019-05-19 (×3): qty 5

## 2019-05-19 MED ORDER — PANTOPRAZOLE SODIUM 40 MG IV SOLR
40.0000 mg | Freq: Two times a day (BID) | INTRAVENOUS | Status: DC
  Administered 2019-05-19 – 2019-05-20 (×2): 40 mg via INTRAVENOUS
  Filled 2019-05-19 (×2): qty 40

## 2019-05-19 NOTE — Progress Notes (Addendum)
   [name] Date of Encounter: 05/19/2019, 3:51 PM    Subjective  Some red balls of stool overnight then NL brown BM today   Objective  BP 111/71 (BP Location: Left Arm)   Pulse 83   Temp 98.9 F (37.2 C) (Oral)   Resp 18   Ht 5\' 4"  (1.626 m)   Wt 90.7 kg   SpO2 97%   BMI 34.33 kg/m  NAD  CBC Latest Ref Rng & Units 05/19/2019 05/18/2019 05/18/2019  WBC 4.0 - 10.5 K/uL 6.8 7.6 7.9  Hemoglobin 12.0 - 15.0 g/dL 7.3(L) 7.5(L) 7.6(L)  Hematocrit 36.0 - 46.0 % 23.5(L) 23.9(L) 22.8(L)  Platelets 150 - 400 K/uL 184 199 174       Assessment and Plan  Gastrojejunal ulcer w/ bleeding - better and I think resolved - must have been old blood as Hgb essentially stable  ? Of RV fistula - was to have an outpatient pelvic CT but will do today and I will let Dr. Marcello Moores of surgery know  Consider dc today vs tomorrow per Fam Med   If here in AM I will try to see but if stable ok to dc w/o my rounding on her  I will have her do CBC at my office next week also - see dc f/u for info and then I will coordinate f/u and possible additional feraheme dose as outpt  Start oral ppi in capsule form - I have chosen omeprazole - can't do that so I did bid IV pantoprazole  She needs a capsule PPI at dc please high dose bid (omeprazole a good choice and open capsule into liquid or applesauce and take)   Gatha Mayer, MD, [facility] 05/19/2019 3:51 PM Pager [phone]

## 2019-05-19 NOTE — Plan of Care (Signed)
  Problem: Activity: Goal: Risk for activity intolerance will decrease Outcome: Progressing   Problem: Clinical Measurements: Goal: Diagnostic test results will improve Outcome: Progressing

## 2019-05-19 NOTE — Progress Notes (Addendum)
Family Medicine Teaching Service Daily Progress Note Intern Pager: [phone]  [name] Medical record number: [id number] Date of birth: 1956-06-23 Age: 63 y.o. Gender: [name]  Primary Care Provider: McDiarmid, Blane Ohara, MD Consultants: GI Code Status: Full  Pt Overview and Major Events to Date:  07/16- Admitted 07/16- EGD  Assessment and Plan: [name] is a 63 y.o. [name]  who presented with acute onset maroon-colored stools, concerning for GI bleed.  PMH is significant for diverticulosis of colon with hemorrhage, subtotal colectomy, gastric bypass in 2013, OA of bilateral knees, hypertension, HLD, GERD, and previous CVA..   GI bleed with associated acute blood loss anemia, likely 2/2 gastric ulcer: Stable.  Reports few episodes of passing ping-pong ball sized maroon-colored clots yesterday. Suspect remanent bleeding rather than continued process but difficult to assess.  Reassured Hgb stable at 7.3, from 7.5. 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  -[facility] following, monitoring closely, consider repeat EGD vs lower endoscopy if continued bleeding/or likely discharge home on 7/19 if remains stable - Monitor CBC daily - Monitor passage of clot frequency - Transition Protonix gtt to oral suspension (closest to capsule or dissolvable on formulary)  - Continue Carafate with meals and at bedtime - Restart home ferrous sulfate 220 mg solution twice daily - Soft diet as tolerated, advancement per GI  Iron deficiency anemia, with history of recurrent GI hemorrhage: Stable Asymptomatic.  Hemoglobin stable as discussed above. S/p IV Feraheme on 7/16, started on p.o. iron.  Ferritin 48 on 7/17.   - Will restart home ferrous sulfate solution twice daily given Roux-en-Y  - Monitor CBC  Hypertension: Chronic, stable. SBP 120-140s overnight, 111/70 this a.m.  Home furosemide 40 mg, HCTZ 12.5 mg,  and lisinopril 20 mg have been held since admission secondary to initial blood loss hypotension.  - Continue to hold home medications as currently normotensive  Hypokalemia: Resolved. K 4.1, from 3.4 yesterday after receiving Kdur.  - Monitor BMP  Hyperlipidemia: Chronic, stable. LDL 45 in 06/2018. - Continue home atorvastatin 40 mg   Previous CVA: Stable. No residual weakness. - Continue home atorvastatin - Not a good candidate for aspirin for secondary prevention due to recurrent bleeds  Insomnia: Chronic, stable. - Continue home zolpidem 5 mg nightly  GERD: Chronic, stable. No current symptomatology. - PPI as above  FEN/GI: Soft diet per GI PPx: SCDs due to GI bleed as above  Disposition: Pending continued GI evaluation, hopeful for discharge home 7/19 remained stable  Subjective:  Doing well this morning, overall states she feels good/like her normal. States she had about 5 episodes of passing ping-pong ball sized dark red clots/bowel movements over the course of yesterday and last night.  A little bit of abdominal pain when passing, however denies any continued belly pains.  Denies any nausea or vomiting.  States she felt a little lightheaded for a brief period yesterday, otherwise has been able to walk back and forth to her bathroom without concern.  Denies any dizziness, shortness of breath, chest pains.  Objective: Temp:  [98.1 F (36.7 C)-98.4 F (36.9 C)] 98.1 F (36.7 C) (07/18 0533) Pulse Rate:  [84-92] 84 (07/18 0533) Resp:  [18-20] 19 (07/18 0533) BP: (111-149)/(70-76) 111/70 (07/18 0533) SpO2:  [98 %-100 %] 100 % (07/18 0533)   General: Alert, NAD, laying comfortably HEENT: NCAT, MMM  Cardiac: RRR no m/g/r Lungs: Clear bilaterally, unlabored breathing Abdomen: soft, non-tender  to all quadrants, non-distended, normoactive BS Msk: Moves all extremities spontaneously  Ext: Warm, dry, 2+ distal pulses, no edema   Laboratory: Recent Labs  Lab 05/18/19 0530  05/18/19 1631 05/19/19 0602  WBC 7.9 7.6 6.8  HGB 7.6* 7.5* 7.3*  HCT 22.8* 23.9* 23.5*  PLT 174 199 184   Recent Labs  Lab 05/17/19 1114 05/18/19 0020 05/19/19 0602  NA 141 139 142  K 3.6 3.4* 4.1  CL 110 108 112*  CO2 20* 22 24  BUN 30* 17 7*  CREATININE 0.93 0.73 0.70  CALCIUM 8.8* 8.7* 8.8*  PROT 5.8*  --   --   BILITOT 0.4  --   --   ALKPHOS 52  --   --   ALT 19  --   --   AST 22  --   --   GLUCOSE 157* 125* 96     Imaging/Diagnostic Tests: No results found.  Patriciaann Clan, DO 05/19/2019, 7:50 AM PGY-1, [place] Intern pager: [phone], text pages welcome

## 2019-05-20 ENCOUNTER — Other Ambulatory Visit: Payer: Self-pay | Admitting: Internal Medicine

## 2019-05-20 DIAGNOSIS — K922 Gastrointestinal hemorrhage, unspecified: Secondary | ICD-10-CM

## 2019-05-20 LAB — CBC WITH DIFFERENTIAL/PLATELET
Abs Immature Granulocytes: 0.05 10*3/uL (ref 0.00–0.07)
Basophils Absolute: 0 10*3/uL (ref 0.0–0.1)
Basophils Relative: 0 %
Eosinophils Absolute: 0.2 10*3/uL (ref 0.0–0.5)
Eosinophils Relative: 3 %
HCT: 22.8 % — ABNORMAL LOW (ref 36.0–46.0)
Hemoglobin: 7.1 g/dL — ABNORMAL LOW (ref 12.0–15.0)
Immature Granulocytes: 1 %
Lymphocytes Relative: 29 %
Lymphs Abs: 2.3 10*3/uL (ref 0.7–4.0)
MCH: 28.2 pg (ref 26.0–34.0)
MCHC: 31.1 g/dL (ref 30.0–36.0)
MCV: 90.5 fL (ref 80.0–100.0)
Monocytes Absolute: 0.8 10*3/uL (ref 0.1–1.0)
Monocytes Relative: 10 %
Neutro Abs: 4.6 10*3/uL (ref 1.7–7.7)
Neutrophils Relative %: 57 %
Platelets: 200 10*3/uL (ref 150–400)
RBC: 2.52 MIL/uL — ABNORMAL LOW (ref 3.87–5.11)
RDW: 20 % — ABNORMAL HIGH (ref 11.5–15.5)
WBC: 8 10*3/uL (ref 4.0–10.5)
nRBC: 1.5 % — ABNORMAL HIGH (ref 0.0–0.2)

## 2019-05-20 MED ORDER — SODIUM CHLORIDE 0.9 % IV SOLN
510.0000 mg | Freq: Once | INTRAVENOUS | Status: DC
  Filled 2019-05-20: qty 17

## 2019-05-20 MED ORDER — SODIUM CHLORIDE 0.9 % IV SOLN
510.0000 mg | Freq: Once | INTRAVENOUS | Status: AC
  Administered 2019-05-20: 510 mg via INTRAVENOUS
  Filled 2019-05-20: qty 17

## 2019-05-20 MED ORDER — OMEPRAZOLE 40 MG PO CPDR: 40.0000 mg | DELAYED_RELEASE_CAPSULE | Freq: Two times a day (BID) | ORAL | 11 refills | Status: DC

## 2019-05-20 NOTE — Progress Notes (Signed)
Family Medicine Teaching Service Daily Progress Note Intern Pager: [phone]  [name] Medical record number: [id number] Date of birth: 10-Mar-1956 Age: 63 y.o. Gender: female  Primary Care Provider: McDiarmid, Blane Ohara, MD Consultants: GI Code Status: Full  Pt Overview and Major Events to Date:  07/16- Admitted 07/16- EGD  Assessment and Plan: [name] is a 63 y.o. female  who presented with acute onset maroon-colored stools, concerning for GI bleed.  PMH is significant for diverticulosis of colon with hemorrhage, subtotal colectomy, gastric bypass in 2013, OA of bilateral knees, hypertension, HLD, GERD, and previous CVA..   GI bleed with associated acute blood loss anemia, likely 2/2 gastric ulcer: Stable No bleeding or clots since yesterday.  Reassured Hgb stable at 7.1, from 7.3.  CT pelvis 07/18- neg for active bleed [name] following, monitoring closely, consider repeat EGD vs lower endoscopy if continued bleeding/or likely discharge home on 7/19 if remains stable Monitor CBC  Transition Protonix IV to oral suspension (closest to capsule or dissolvable on formulary)  Continue Carafate with meals and at bedtime Continue Ferrous sulfate Soft diet as tolerated, advancement per GI  Iron deficiency anemia, with history of recurrent GI hemorrhage: Stable Asymptomatic.  Hemoglobin stable as discussed above. S/p IV Feraheme on 7/16, started on p.o. iron.  Ferritin 48 on 7/17.   Continue Ferrous sulfate   Monitor CBC  Hypertension: Chronic, stable. SBP  129650 this a.m.  Home furosemide 40 mg, HCTZ 12.5 mg, and lisinopril 20 mg have been held since admission secondary to initial blood loss hypotension.  Continue to hold home medications as currently normotensive Monitor BP  Hypokalemia: Resolved. Monitor BMP  Hyperlipidemia: Chronic, stable. Continue home atorvastatin 40 mg   Previous CVA: Stable. No residual weakness. Continue home atorvastatin Not  a good candidate for aspirin for secondary prevention due to recurrent bleeds  Insomnia: Chronic, stable. Continue home zolpidem 5 mg nightly  GERD: Chronic, stable. No current symptomatology. PPI as above  FEN/GI: Soft diet per GI PPx: SCDs due to GI bleed as above  Disposition: Pending continued GI evaluation, hopeful for discharge home 7/19 remained stable  Subjective:  Pt seen and assessed.  No further episodes of passing blood clots.  No issues overnight.  Denies dizziness, chest pain, SOB.  Appetite good.  Voiding well, stool now back to regular color.  Objective: Temp:  [98 F (36.7 C)-98.9 F (37.2 C)] 98.2 F (36.8 C) (07/19 0501) Pulse Rate:  [81-90] 81 (07/19 0501) Resp:  [16-18] 16 (07/19 0501) BP: (110-125)/(68-76) 125/68 (07/19 0501) SpO2:  [97 %-100 %] 100 % (07/19 0501)   General: Alert and oriented, no apparent distress  Cardiovascular: RRR with no murmurs noted Respiratory: CTAB, no IWOD, on room air  Gastrointestinal: Bowel sounds present. No abdominal pain MSK: Upper extremity strength 5/5 bilaterally, Lower extremity strength 5/5 bilaterally  Psych: Behavior and speech appropriate to situation  Laboratory: Recent Labs  Lab 05/18/19 1631 05/19/19 0602 05/20/19 0215  WBC 7.6 6.8 8.0  HGB 7.5* 7.3* 7.1*  HCT 23.9* 23.5* 22.8*  PLT 199 184 200   Recent Labs  Lab 05/17/19 1114 05/18/19 0020 05/19/19 0602  NA 141 139 142  K 3.6 3.4* 4.1  CL 110 108 112*  CO2 20* 22 24  BUN 30* 17 7*  CREATININE 0.93 0.73 0.70  CALCIUM 8.8* 8.7* 8.8*  PROT 5.8*  --   --   BILITOT 0.4  --   --   ALKPHOS 52  --   --  ALT 19  --   --   AST 22  --   --   GLUCOSE 157* 125* 96     Imaging/Diagnostic Tests: Ct Pelvis W Contrast  Result Date: 05/19/2019 CLINICAL DATA:  Rectal bleeding. EXAM: CT PELVIS WITH CONTRAST TECHNIQUE: Multidetector CT imaging of the pelvis was performed using the standard protocol following the bolus administration of intravenous  contrast. CONTRAST:  114mL OMNIPAQUE IOHEXOL 300 MG/ML  SOLN COMPARISON:  CT dated September 24, 2012. FINDINGS: Urinary Tract: The urinary bladder is relatively decompressed which limits evaluation. Bowel: The patient appears to be status post sigmoid colectomy. There is a large amount of stool in the visualized colon. Vascular/Lymphatic: No pathologically enlarged lymph nodes. No significant vascular abnormality seen. Reproductive: No mass or other significant abnormality. No definite evidence for a fistula. The patient is status post prior hysterectomy. Other:  None. Musculoskeletal: No suspicious bone lesions identified. IMPRESSION: No acute abnormality. No findings to explain the patient's rectal bleeding. Electronically Signed   By: Constance Holster M.D.   On: 05/19/2019 23:56    Carollee Leitz, MD 05/20/2019, 6:28 AM PGY-1, [place] Intern pager: [phone], text pages welcome

## 2019-05-20 NOTE — Plan of Care (Signed)
  Problem: Education: Goal: Knowledge of General Education information will improve Description: Including pain rating scale, medication(s)/side effects and non-pharmacologic comfort measures 05/20/2019 1416 by Dolores Hoose, RN Outcome: Adequate for Discharge   Problem: Clinical Measurements: Goal: Diagnostic test results will improve 05/20/2019 1416 by Dolores Hoose, RN Outcome: Adequate for Discharge 05/20/2019 0757 by Dolores Hoose, RN Outcome: Progressing   Problem: Nutrition: Goal: Adequate nutrition will be maintained 05/20/2019 1416 by Dolores Hoose, RN Outcome: Adequate for Discharge 05/20/2019 0757 by Dolores Hoose, RN Outcome: Progressing

## 2019-05-20 NOTE — Progress Notes (Addendum)
   [name] Date of Encounter: 05/20/2019, 1:51 PM    Subjective  Feels ok  She was not dizzy walking   Objective  BP 129/65   Pulse 92   Temp 98.2 F (36.8 C) (Oral)   Resp 16   Ht 5\' 4"  (1.626 m)   Wt 90.7 kg   SpO2 99%   BMI 34.33 kg/m   CBC Latest Ref Rng & Units 05/20/2019 05/19/2019 05/18/2019  WBC 4.0 - 10.5 K/uL 8.0 6.8 7.6  Hemoglobin 12.0 - 15.0 g/dL 7.1(L) 7.3(L) 7.5(L)  Hematocrit 36.0 - 46.0 % 22.8(L) 23.5(L) 23.9(L)  Platelets 150 - 400 K/uL 200 184 199      Assessment and Plan  Gastrojejunal ulcer with hemorrhage Acute blood loss anemia Iron malabsorption   I think ok to go home - she asked to be off work x 1 week Agree w/ second feraheme  I will have her do a Hgb 7/22 at my office and will then arrange a visit and other GI f/u  Thanks  Gatha Mayer, MD, [facility] 05/20/2019 1:51 PM Pager [phone]

## 2019-05-20 NOTE — Progress Notes (Signed)
DISCHARGE NOTE SNF [name] to be discharged Home per MD order. Patient verbalized understanding.  Skin clean, dry and intact without evidence of skin break down, no evidence of skin tears noted. IV catheter discontinued intact. Site without signs and symptoms of complications. Dressing and pressure applied. Pt denies pain at the site currently. No complaints noted.  Patient free of lines, drains, and wounds.   Discharge packet assembled. An After Visit Summary (AVS) was printed and given to the patient at bedside. Patient escorted via wheelchair and discharged to designated family member via private vehicle. All questions and concerns addressed.   Dolores Hoose, RN

## 2019-05-20 NOTE — Plan of Care (Signed)

## 2019-05-20 NOTE — Progress Notes (Unsigned)
CBC

## 2019-05-20 NOTE — Discharge Summary (Signed)
[facility] Discharge Summary  [name] Medical record number: [id number] Date of birth: 1956-07-22 Age: 63 y.o. Gender: female Date of Admission: 05/17/2019  Date of Discharge:05/20/2019 Admitting Physician: No admitting provider for patient encounter.  Primary Care Provider: McDiarmid, Blane Ohara, MD Consultants: GI  Indication for Hospitalization: symptomatic anemia  Discharge Diagnoses/Problem List:  Anemia GI Bleed HTN HLD GERD IRON DEFICIENCY HYPOKALEMIA  Disposition: Home  Discharge Condition: stable  Discharge Exam:  General: Alert and oriented, no apparent distress      Cardiovascular: RRR with no murmurs noted Respiratory: CTAB, no IWOD, on room air     Gastrointestinal: Bowel sounds present. No abdominal pain, no bleeding MSK: Upper extremity strength 5/5 bilaterally, Lower extremity strength 5/5 bilaterally        Psych: Behavior and speech appropriate to situation  Brief Hospital Course:  [name] is a 63 y.o. female presenting with symptomatic anaemia secondary to a GI bleed. She was seen by GI and underwent an EGD which was significant for non bleeding ulcer at the gastrojejunal anastomosis that was treated with injection and endoclips.  S/p egd, she had some passing of maroon colored stool.  CT pelvis negative for any acute abnormality. No further workup was required as symptoms resolved.  Prior to discharge she was give one dose of IV Feraheme.    Issues for Follow Up:  1- Monitor for any bleeding  2- follow up with Dr Carlean Purl on wed with labs  Significant Procedures: EGD Significant Labs and Imaging:  Recent Labs  Lab 05/18/19 1631 05/19/19 0602 05/20/19 0215  WBC 7.6 6.8 8.0  HGB 7.5* 7.3* 7.1*  HCT 23.9* 23.5* 22.8*  PLT 199 184 200   Recent Labs  Lab 05/17/19 1114 05/18/19 0020 05/19/19 0602  NA 141 139 142  K 3.6 3.4* 4.1  CL 110 108 112*  CO2 20* 22 24  GLUCOSE 157* 125* 96  BUN 30*  17 7*  CREATININE 0.93 0.73 0.70  CALCIUM 8.8* 8.7* 8.8*  ALKPHOS 52  --   --   AST 22  --   --   ALT 19  --   --   ALBUMIN 3.1*  --   --       Results/Tests Pending at Time of Discharge: No results found.  Discharge Medications:  Allergies as of 05/20/2019      Reactions   Aspirin Other (See Comments)   CAN NOT TAKE DUE TO GI BLEED GI BLEED   Pantoprazole Sodium    NEEDS CAPSULE OR DISSOLVABLE PPI - TABLET WILL NOT ABSORB WELL S/P GASTRIC BYPASS   Nsaids Other (See Comments)   GI BLEED   Tolmetin Other (See Comments)   GI BLEED      Medication List    STOP taking these medications   diclofenac sodium 1 % Gel Commonly known as: VOLTAREN   furosemide 40 MG tablet Commonly known as: LASIX   hydrochlorothiazide 12.5 MG capsule Commonly known as: MICROZIDE   lisinopril 20 MG tablet Commonly known as: ZESTRIL   pantoprazole 40 MG tablet Commonly known as: PROTONIX   Santyl ointment Generic drug: collagenase   traMADol 50 MG tablet Commonly known as: ULTRAM   triamcinolone ointment 0.5 % Commonly known as: KENALOG     TAKE these medications   atorvastatin 40 MG tablet Commonly known as: LIPITOR TAKE ONE TABLET DAILY What changed:   how much to take  how to take this  when  to take this   benzonatate 100 MG capsule Commonly known as: TESSALON Take 2 capsules (200 mg total) by mouth 3 (three) times daily as needed for cough.   calcium carbonate 600 MG Tabs tablet Commonly known as: OS-CAL Take 1 tablet (600 mg total) by mouth 2 (two) times daily with a meal.   cetirizine 10 MG tablet Commonly known as: ZYRTEC Take 1 tablet (10 mg total) by mouth daily. What changed:   when to take this  reasons to take this   ferrous sulfate 220 (44 Fe) MG/5ML solution Take 5 mLs (220 mg total) by mouth 3 (three) times daily with meals. What changed: when to take this   fluticasone 50 MCG/ACT nasal spray Commonly known as: FLONASE TWO SPRAYS IN EACH  NOSTRIL AT BEDTIME What changed: See the new instructions.   multivitamin tablet Take 1 tablet by mouth daily.   omeprazole 40 MG capsule Commonly known as: PriLOSEC Take 1 capsule (40 mg total) by mouth 2 (two) times daily with a meal.   polyethylene glycol powder 17 GM/SCOOP powder Commonly known as: GLYCOLAX/MIRALAX Take 17 g by mouth 2 (two) times daily as needed.   potassium chloride SA 20 MEQ tablet Commonly known as: K-DUR Take 20 mEq by mouth daily.   sucralfate 1 GM/10ML suspension Commonly known as: Carafate TAKE 68ml (2 TEASPOONSFUL) BY MOUTH 4 TIMES DAILY WITH MEALS AND ATBEDTIME What changed:   how much to take  how to take this  when to take this  additional instructions   vitamin B-12 1000 MCG tablet Commonly known as: CYANOCOBALAMIN Take 1,000 mcg by mouth daily.   vitamin C 250 MG tablet Commonly known as: ASCORBIC ACID Take 250 mg by mouth daily.   Vitamins A & D 5000-400 units Caps Take 5,000 Units by mouth daily.   zolpidem 5 MG tablet Commonly known as: AMBIEN TAKE ONE TABLET AT BEDTIME AS NEEDED FORSLEEP What changed:   how much to take  how to take this  when to take this       Discharge Instructions: Please refer to Patient Instructions section of EMR for full details.  Patient was counseled important signs and symptoms that should prompt return to medical care, changes in medications, dietary instructions, activity restrictions, and follow up appointments.   Follow-Up Appointments: Follow-up Information    Gatha Mayer, MD Follow up on 05/23/2019.   Specialty: Gastroenterology Why: GO TO LAB IN BASEMENT 730-5PM and do blood work Contact information: [address]. [place] [address] [phone]           Carollee Leitz, MD 05/21/2019, 5:37 PM PGY-1, [place]

## 2019-05-23 ENCOUNTER — Other Ambulatory Visit (INDEPENDENT_AMBULATORY_CARE_PROVIDER_SITE_OTHER): Payer: Medicare HMO

## 2019-05-23 DIAGNOSIS — K284 Chronic or unspecified gastrojejunal ulcer with hemorrhage: Secondary | ICD-10-CM

## 2019-05-23 LAB — CBC
HCT: 27.2 % — ABNORMAL LOW (ref 36.0–46.0)
Hemoglobin: 8.8 g/dL — ABNORMAL LOW (ref 12.0–15.0)
MCHC: 32.5 g/dL (ref 30.0–36.0)
MCV: 91.3 fl (ref 78.0–100.0)
Platelets: 278 10*3/uL (ref 150.0–400.0)
RBC: 2.98 Mil/uL — ABNORMAL LOW (ref 3.87–5.11)
RDW: 21.6 % — ABNORMAL HIGH (ref 11.5–15.5)
WBC: 8 10*3/uL (ref 4.0–10.5)

## 2019-05-24 NOTE — Progress Notes (Signed)
Please let her know that the hgb is better at 8.8.  She needs a repeat CBC and a ferritin in 3 weeks and a f/u me after that next available after the labs  Dx acute blood loss anemia, gastrojejunal ulcer

## 2019-05-25 ENCOUNTER — Other Ambulatory Visit: Payer: Self-pay

## 2019-05-25 DIAGNOSIS — K284 Chronic or unspecified gastrojejunal ulcer with hemorrhage: Secondary | ICD-10-CM

## 2019-05-25 DIAGNOSIS — K922 Gastrointestinal hemorrhage, unspecified: Secondary | ICD-10-CM

## 2019-05-30 ENCOUNTER — Ambulatory Visit: Payer: Medicare HMO | Admitting: Orthotics

## 2019-05-30 ENCOUNTER — Other Ambulatory Visit: Payer: Self-pay

## 2019-05-30 DIAGNOSIS — M19071 Primary osteoarthritis, right ankle and foot: Secondary | ICD-10-CM

## 2019-05-30 DIAGNOSIS — M6788 Other specified disorders of synovium and tendon, other site: Secondary | ICD-10-CM

## 2019-05-30 DIAGNOSIS — R2681 Unsteadiness on feet: Secondary | ICD-10-CM

## 2019-05-30 DIAGNOSIS — M25571 Pain in right ankle and joints of right foot: Secondary | ICD-10-CM

## 2019-05-30 NOTE — Progress Notes (Signed)
Patient presents today for evaluation/casting for AFO brace (r).   Patient has hx of the following conditions: Gait instability,  Ankle instabilty,  Gait analysis done and patient displays abnormality of gait in both sagittial and frontal planes, and could benefit in aggressive ankle support.  Patient chose [place] brace w/ lace/speed laces.  

## 2019-05-31 ENCOUNTER — Telehealth (INDEPENDENT_AMBULATORY_CARE_PROVIDER_SITE_OTHER): Payer: Medicare HMO | Admitting: Family Medicine

## 2019-05-31 ENCOUNTER — Other Ambulatory Visit: Payer: Self-pay

## 2019-05-31 DIAGNOSIS — Z20828 Contact with and (suspected) exposure to other viral communicable diseases: Secondary | ICD-10-CM

## 2019-05-31 DIAGNOSIS — Z20822 Contact with and (suspected) exposure to covid-19: Secondary | ICD-10-CM

## 2019-05-31 NOTE — Progress Notes (Signed)
[place] Telemedicine Visit  Patient consented to have virtual visit. Method of visit: Telephone  Encounter participants: Patient: [name] - located at home Provider: Bon[place]ita [place] - located at office Others (if applicable): Sister, Enid Derry  Chief Complaint: Exposure to [place]  HPI: [name] is a 63 y.o. female who presents with possible exposure to [place].  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  Enid Derry goes to an adult daycare center.  This week someone had tested positive at COVID-19 at the center.  Therefore in order to return to the center everyone who is a client or caregiver of a client has to be tested and confirm negative before returning.  Both Enid Derry and [place] are asymptomatic and denies fevers, chills, cough, congestion.  ROS: per HPI  Pertinent PMHx:  Noncontributory  Exam:  Respiratory: Able to speak full sentences without issue  Assessment/Plan: Potential exposure to COVID-19 Asymptomatic.  Needs testing so sister can return to adult daycare.  Will order testing for patient and sister.  Time spent during visit with patient: 5 minutes

## 2019-06-01 ENCOUNTER — Other Ambulatory Visit: Payer: Self-pay

## 2019-06-01 ENCOUNTER — Other Ambulatory Visit: Payer: Medicare HMO

## 2019-06-01 DIAGNOSIS — Z20822 Contact with and (suspected) exposure to covid-19: Secondary | ICD-10-CM

## 2019-06-01 DIAGNOSIS — R6889 Other general symptoms and signs: Secondary | ICD-10-CM | POA: Diagnosis not present

## 2019-06-03 LAB — NOVEL CORONAVIRUS, NAA: SARS-CoV-2, NAA: NOT DETECTED

## 2019-06-11 DIAGNOSIS — M25561 Pain in right knee: Secondary | ICD-10-CM | POA: Diagnosis not present

## 2019-06-12 ENCOUNTER — Other Ambulatory Visit: Payer: Self-pay | Admitting: *Deleted

## 2019-06-12 DIAGNOSIS — K219 Gastro-esophageal reflux disease without esophagitis: Secondary | ICD-10-CM

## 2019-06-12 MED ORDER — SUCRALFATE 1 GM/10ML PO SUSP: ORAL | 2 refills | Status: DC

## 2019-06-12 MED ORDER — ZOLPIDEM TARTRATE 5 MG PO TABS: ORAL_TABLET | ORAL | 0 refills | Status: DC

## 2019-06-19 ENCOUNTER — Other Ambulatory Visit (INDEPENDENT_AMBULATORY_CARE_PROVIDER_SITE_OTHER): Payer: Medicare HMO

## 2019-06-19 DIAGNOSIS — K922 Gastrointestinal hemorrhage, unspecified: Secondary | ICD-10-CM | POA: Diagnosis not present

## 2019-06-19 DIAGNOSIS — K284 Chronic or unspecified gastrojejunal ulcer with hemorrhage: Secondary | ICD-10-CM | POA: Diagnosis not present

## 2019-06-19 LAB — CBC
HCT: 34.7 % — ABNORMAL LOW (ref 36.0–46.0)
Hemoglobin: 11.1 g/dL — ABNORMAL LOW (ref 12.0–15.0)
MCHC: 31.8 g/dL (ref 30.0–36.0)
MCV: 94.1 fl (ref 78.0–100.0)
Platelets: 249 10*3/uL (ref 150.0–400.0)
RBC: 3.69 Mil/uL — ABNORMAL LOW (ref 3.87–5.11)
RDW: 19.6 % — ABNORMAL HIGH (ref 11.5–15.5)
WBC: 6.2 10*3/uL (ref 4.0–10.5)

## 2019-06-19 LAB — FERRITIN: Ferritin: 50.5 ng/mL (ref 10.0–291.0)

## 2019-06-20 ENCOUNTER — Telehealth: Payer: Self-pay | Admitting: Podiatry

## 2019-06-20 NOTE — Telephone Encounter (Signed)
Pt returned my call from earlier and left message for me to call back.  I returned call and left message for pt to call to schedule an appt with Liliane Channel to pick up brace.

## 2019-06-21 ENCOUNTER — Ambulatory Visit: Payer: Medicare HMO | Admitting: Internal Medicine

## 2019-06-21 ENCOUNTER — Encounter: Payer: Self-pay | Admitting: Internal Medicine

## 2019-06-21 DIAGNOSIS — Z800 Family history of malignant neoplasm of digestive organs: Secondary | ICD-10-CM | POA: Insufficient documentation

## 2019-06-21 DIAGNOSIS — K284 Chronic or unspecified gastrojejunal ulcer with hemorrhage: Secondary | ICD-10-CM

## 2019-06-21 DIAGNOSIS — D508 Other iron deficiency anemias: Secondary | ICD-10-CM

## 2019-06-21 HISTORY — DX: Family history of malignant neoplasm of digestive organs: Z80.0

## 2019-06-21 NOTE — Patient Instructions (Signed)
You have been scheduled for an endoscopy. Please follow written instructions given to you at your visit today. If you use inhalers (even only as needed), please bring them with you on the day of your procedure.   We are changing your colonoscopy recall to January 2021.    I appreciate the opportunity to care for you. Silvano Rusk, MD, [facility]

## 2019-06-21 NOTE — Assessment & Plan Note (Signed)
Malabsorption and blood loss Stay on ferrous sulfate parenteral iron if necessary - ferritin 50 so not now

## 2019-06-21 NOTE — Assessment & Plan Note (Addendum)
F/U EGD mid September Continue current therapy

## 2019-06-21 NOTE — Assessment & Plan Note (Signed)
Should have a repeat colonoscopy January 2021

## 2019-06-21 NOTE — Progress Notes (Signed)
[name] 63 y.o. [birth year] [id number]  Assessment & Plan:  Gastrojejunal ulcer with hemorrhage F/U EGD mid September Continue current therapy  Iron deficiency anemia Malabsorption and blood loss Stay on ferrous sulfate parenteral iron if necessary - ferritin 50 so not now  Family history of colon cancer in father diagnosed in 71s Should have a repeat colonoscopy January 2021    I appreciate the opportunity to care for this patient. CC: McDiarmid, Blane Ohara, MD   Subjective:   Chief Complaint: Follow-up of gastrojejunal ulcer  HPI The patient is here for follow-up, she had a gastrojejunal ulcer with hemorrhage diagnosed and treated endoscopically (clips) in mid July.  Her hemoglobin has a risen to 11.7 and her ferritin is 50.  She received Feraheme x2 in the hospital and is on ferrous sulfate liquid.  She is not having any more bleeding and her energy level is good.  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 Allergies  Allergen Reactions  . Aspirin Other (See Comments)    CAN NOT TAKE DUE TO GI BLEED GI BLEED  . Pantoprazole Sodium     NEEDS CAPSULE OR DISSOLVABLE PPI - TABLET WILL NOT ABSORB WELL S/P GASTRIC BYPASS  . Nsaids Other (See Comments)    GI BLEED  . Tolmetin Other (See Comments)    GI BLEED   Current Meds  Medication Sig  . atorvastatin (LIPITOR) 40 MG tablet TAKE ONE TABLET DAILY (Patient taking differently: Take 40 mg by mouth daily at 6 PM. TAKE ONE TABLET DAILY)  . benzonatate (TESSALON) 100 MG capsule Take 2 capsules (200 mg total) by mouth 3 (three) times daily as needed for cough.  . calcium carbonate (OS-CAL) 600 MG TABS tablet Take 1 tablet (600 mg total) by mouth 2 (two) times daily with a meal.  . cetirizine (ZYRTEC) 10 MG tablet Take 1 tablet (10 mg total) by mouth daily. (Patient taking differently: Take 10 mg by mouth daily as needed for allergies. )  . ferrous sulfate 220 (44 Fe) MG/5ML solution Take 5 mLs (220 mg total) by mouth 3 (three)  times daily with meals. (Patient taking differently: Take 220 mg by mouth 2 (two) times daily with a meal. )  . fluticasone (FLONASE) 50 MCG/ACT nasal spray TWO SPRAYS IN EACH NOSTRIL AT BEDTIME (Patient taking differently: Place 2 sprays into both nostrils 3 (three) times daily as needed for allergies. )  . furosemide (LASIX) 40 MG tablet Take 1 tablet by mouth daily.  . Multiple Vitamin (MULTIVITAMIN) tablet Take 1 tablet by mouth daily.  Marland Kitchen omeprazole (PRILOSEC) 40 MG capsule Take 1 capsule (40 mg total) by mouth 2 (two) times daily with a meal.  . polyethylene glycol powder (GLYCOLAX/MIRALAX) powder Take 17 g by mouth 2 (two) times daily as needed.  . potassium chloride SA (K-DUR) 20 MEQ tablet Take 20 mEq by mouth daily.  . sucralfate (CARAFATE) 1 GM/10ML suspension TAKE 64ml (2 TEASPOONSFUL) BY MOUTH 4 TIMES DAILY WITH MEALS AND ATBEDTIME  . vitamin B-12 (CYANOCOBALAMIN) 1000 MCG tablet Take 1,000 mcg by mouth daily.  . vitamin C (ASCORBIC ACID) 250 MG tablet Take 250 mg by mouth daily.  . Vitamins A & D 5000-400 units CAPS Take 5,000 Units by mouth daily.  Marland Kitchen zolpidem (AMBIEN) 5 MG tablet TAKE ONE TABLET AT BEDTIME AS NEEDED FOR SLEEP   Past Medical History:  Diagnosis Date  . AC (acromioclavicular) joint arthritis 12/23/2017   Left side  . Acute renal failure (  [place]) 09/24/2016  . Anemia   . Arthritis    knees  . Back pain 10/04/2012  . Biliary sludge 04/08/2014   Taking ursodiol chronically    . Blood in stool 10/07/2014  . Blood transfusion without reported diagnosis   . Bunion 03/28/2014  . Cellulitis of leg, left 09/14/2016  . Cervical pain (neck) 03/26/2016  . Chest pain 05/12/2015  . Constipation 10/19/2013   Chronic in the setting of known diverticulosis and history of bleeds.    . Cramping of hands 02/27/2016  . CYST, KIDNEY, ACQUIRED 08/15/2007   Qualifier: Diagnosis of  By: Mellody Drown MD, [facility]    . 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 Degenerative arthritis of right shoulder region 08/2013  . Diverticulosis   . Diverticulosis of colon with hemorrhage    Hx  Of recurrent Diverticular bleeding - several prior admissions   . Diverticulosis of colon with hemorrhage,  s/p colectomy ileorectal anastamosis    Hx  Of recurrent Diverticular bleeding - 4 to 5  prior admissions for GI bleeding 2016 and before. Seen inhouse by [facility] in 2016  . Diverticulosis of colon without hemorrhage 11/18/2014   Noted on colonoscopy 11/2014   . External hemorrhoids without complication [id number]  . GERD (gastroesophageal reflux disease)   . High cholesterol   . History of GI bleed   . History of stroke 02/13/2014   Overview:  Dx on MRI when eval for h/a but denies any sxs  . HX of Diverticulosis of colon with hemorrhage,  s/p colectomy ileorectal anastamosis    Hx  Of recurrent Diverticular bleeding - 4 to 5  prior admissions for GI bleeding 2016 and before. Seen inhouse by [facility] in 2016  . Hypertension    under control with meds., has been on med. > 20 yr.  . Hypopigmented skin lesion 02/23/2011  . Ileus following gastrointestinal surgery ([place]) 12/12/2015  . Intractable vomiting 09/24/2016  . Leg cramping 04/22/2014  . Lipoma of lower extremity 03/07/2013   Overview:  RIGHT THIGH  . Lower GI bleed   . Lumbar back pain 03/26/2016  . Medial meniscus tear 1997   Right Knee  . Mini stroke ([place])   . Morbid obesity ([place]) 12/29/2006  . Multiple open wounds of lower leg 11/12/2016  . Muscle pain 05/26/2015  . Premature supraventricular beats 12/25/2015   EKG 12/25/15   . Rash and nonspecific skin eruption 04/01/2017  . Right knee prosthesis with joint effusion ([place]) 05/18/2019   CT knee 04/2019 :RIGHT knee prosthesis with moderate knee joint effusion  . Rotator cuff rupture, complete 08/2013   right  . Shoulder impingement 08/2013   right  . Skin lesion of right lower extremity 05/27/2016  . Small bowel obstruction ([place])  12/12/2015  . Snoring 08/24/2018  . Stroke [facility]) 2006   Remote left lacunar infarct noted on CT head 2006, 2010   . Subacromial bursitis 05/21/2013  . Superficial thrombophlebitis 01/29/2016  . Tingling 11/17/2017  . Ulcer   . Ulcer at site of surgical anastomosis following bypass of stomach 05/18/2019  . Venous stasis ulcer of left ankle with fat layer exposed with varicose veins ([place]) 10/20/2016  . Wears dentures    upper  . Wears partial dentures    lower   Past Surgical History:  Procedure Laterality Date  . ABDOMINAL HYSTERECTOMY  ?1987   partial  . COLONOSCOPY  05/02/2000; 05/08/2013  .  COSMETIC SURGERY  02/22/2014   skin removal surgery from lower abdomen   . ENDOVENOUS ABLATION SAPHENOUS VEIN W/ LASER Right 08/12/2016   endovenous laser ablation right greater saphenous vein by Curt Jews MD   . ENDOVENOUS ABLATION SAPHENOUS VEIN W/ LASER Left 10/21/2016   EVLA L GSV by Curt Jews MD  . ESOPHAGOGASTRODUODENOSCOPY N/A 05/17/2019   Procedure: ESOPHAGOGASTRODUODENOSCOPY (EGD);  Surgeon: Gatha Mayer, MD;  Location: [facility] ENDOSCOPY;  Service: Endoscopy;  Laterality: N/A;  . HEMOSTASIS CLIP PLACEMENT  05/17/2019   Procedure: HEMOSTASIS CLIP PLACEMENT;  Surgeon: Gatha Mayer, MD;  Location: [facility] ENDOSCOPY;  Service: Endoscopy;;  . METATARSAL OSTEOTOMY Right 1995   Right Bunion Repair  . ROTATOR CUFF REPAIR Left 04/27/2018  . 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 SCLEROTHERAPY  05/17/2019   Procedure: SCLEROTHERAPY;  Surgeon: Gatha Mayer, MD;  Location: [facility] ENDOSCOPY;  Service: Endoscopy;;  . SHOULDER ARTHROSCOPY WITH ROTATOR CUFF REPAIR AND SUBACROMIAL DECOMPRESSION Right 08/09/2013   Procedure: RIGHT SHOULDER ARTHROSCOPY WITH SUBACROMIAL DECOMPRESSION, DISTAL CLAVICLE EXCISION AND ROTATOR CUFF REPAIR and release biceps;  Surgeon: Ninetta Lights, MD;  Location: [place];  Service: Orthopedics;  Laterality: Right;  . SUBTOTAL COLECTOMY  11/24/2015   [facility]:  laparoscopic-assisted subtotal colectomy with ileorectal anastomosis.  Marland Kitchen TOTAL KNEE ARTHROPLASTY Left   . TOTAL KNEE ARTHROPLASTY Right    Social History   Social History Narrative   She is divorced and has 1 child   She works in a daycare facility   Occasional wine former smoker no drug use   family history includes Cancer (age of onset: 82) in her father; Colon cancer in her father; Diabetes in her brother, brother, brother, brother, mother, sister, and sister; Heart disease in her sister; Hypertension in her mother and son; Stroke in her brother and sister.   Review of Systems As per HPI  Objective:   Physical Exam BP 116/78 (BP Location: Left Arm, Patient Position: Sitting, Cuff Size: Large)   Pulse 88   Temp 98.3 F (36.8 C)   Ht 5' 3.25" (1.607 m) Comment: height measured without shoes  Wt 214 lb 8 oz (97.3 kg)   BMI 37.70 kg/m  NAD Eyes anicteric Appropriate mood and affect

## 2019-06-22 ENCOUNTER — Ambulatory Visit: Payer: Medicare HMO | Admitting: Podiatry

## 2019-07-02 ENCOUNTER — Ambulatory Visit (INDEPENDENT_AMBULATORY_CARE_PROVIDER_SITE_OTHER): Payer: Medicare HMO | Admitting: Orthotics

## 2019-07-02 ENCOUNTER — Other Ambulatory Visit: Payer: Self-pay

## 2019-07-02 DIAGNOSIS — R2681 Unsteadiness on feet: Secondary | ICD-10-CM

## 2019-07-02 DIAGNOSIS — M19071 Primary osteoarthritis, right ankle and foot: Secondary | ICD-10-CM

## 2019-07-02 DIAGNOSIS — M25571 Pain in right ankle and joints of right foot: Secondary | ICD-10-CM

## 2019-07-02 DIAGNOSIS — M2142 Flat foot [pes planus] (acquired), left foot: Secondary | ICD-10-CM

## 2019-07-02 DIAGNOSIS — M2141 Flat foot [pes planus] (acquired), right foot: Secondary | ICD-10-CM

## 2019-07-05 ENCOUNTER — Encounter: Payer: Self-pay | Admitting: Family Medicine

## 2019-07-05 ENCOUNTER — Ambulatory Visit (INDEPENDENT_AMBULATORY_CARE_PROVIDER_SITE_OTHER): Payer: Medicare HMO | Admitting: Family Medicine

## 2019-07-05 ENCOUNTER — Other Ambulatory Visit: Payer: Self-pay

## 2019-07-05 VITALS — BP 136/78 | HR 71 | Wt 219.0 lb

## 2019-07-05 DIAGNOSIS — N823 Fistula of vagina to large intestine: Secondary | ICD-10-CM | POA: Diagnosis not present

## 2019-07-05 DIAGNOSIS — K289 Gastrojejunal ulcer, unspecified as acute or chronic, without hemorrhage or perforation: Secondary | ICD-10-CM

## 2019-07-05 DIAGNOSIS — Z23 Encounter for immunization: Secondary | ICD-10-CM | POA: Diagnosis not present

## 2019-07-05 DIAGNOSIS — K219 Gastro-esophageal reflux disease without esophagitis: Secondary | ICD-10-CM

## 2019-07-05 DIAGNOSIS — D509 Iron deficiency anemia, unspecified: Secondary | ICD-10-CM | POA: Diagnosis not present

## 2019-07-05 DIAGNOSIS — T85898A Other specified complication of other internal prosthetic devices, implants and grafts, initial encounter: Secondary | ICD-10-CM

## 2019-07-05 DIAGNOSIS — Z9884 Bariatric surgery status: Secondary | ICD-10-CM

## 2019-07-05 DIAGNOSIS — B029 Zoster without complications: Secondary | ICD-10-CM | POA: Diagnosis not present

## 2019-07-05 MED ORDER — SUCRALFATE 1 GM/10ML PO SUSP: ORAL | 2 refills | Status: DC

## 2019-07-05 MED ORDER — ZOLPIDEM TARTRATE 5 MG PO TABS: ORAL_TABLET | ORAL | 0 refills | Status: DC

## 2019-07-05 MED ORDER — FERROUS SULFATE 220 (44 FE) MG/5ML PO ELIX: 220.0000 mg | ORAL_SOLUTION | Freq: Every day | ORAL | 2 refills | Status: DC

## 2019-07-05 MED ORDER — ZOSTER VAC RECOMB ADJUVANTED 50 MCG/0.5ML IM SUSR: 0.5000 mL | Freq: Once | INTRAMUSCULAR | 0 refills | Status: AC

## 2019-07-05 NOTE — Patient Instructions (Addendum)
Decrease your iron to once a day with a meal.   I like the idea of decreasing fried foods in your diet.   Please come back to see me in three months.   Take your prescription for the Shingrix (Shingles) vaccination to a pharmacy where they may administer it.

## 2019-07-06 ENCOUNTER — Encounter: Payer: Self-pay | Admitting: Family Medicine

## 2019-07-06 NOTE — Progress Notes (Signed)
Subjective:    Patient ID: [name], female    DOB: [birth year], 63 y.o.   MRN: [id number]  HPI Follow up outpatient visit after Hospitalization  [name] is alone Sources of clinical information for visit is/are patient, past medical records and Discharge summary. The Discharge Summary for the hospitalization from 05/17/19 to 05/20/19 was reviewed.  Nursing assessment for this office visit was reviewed with the patient for accuracy and revision.   HPI Principle Diagnosis requiring hospitalization: Gastrojejunostomy anastomosis ulcer and symptomatic anemia  Brief Hospital course summary:  Pt presented with symptomatic bleeding. Found to have G-J anastomosis ulcer by EGD by Dr Silvano Rusk who tx'd site with injection and endoclips.  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 - Concern started after vagianl speculum exam at [facility] 03/22/19. - Was to go to Gen Surgery but appointment canceled. - CT during this hosp admission did not show fistula. - Pt denies any vaginal dc or drainage, and defintiely no stool drainage.  Anemia - see above - Onset in July - Taking liquid FeSO4 TID with food - upsetting her stomach. Would like to know if she may cut it down or stop it.  - Last Hgb by Dr Carlean Purl 11.1 with FTN 50 ---------------------------------------------------------------------------------------------------------------------- Follow up appointments with specialists: Dr Carlean Purl (GI) 06/23/19 Completed: yes Plan for EGD  next week ---------------------------------------------------------------------------------------------------------------------- DC medications started during hospitalization:  atorvastatin 40 MG tablet Commonly known as: LIPITOR TAKE ONE TABLET DAILY What changed:   how much to take  how to take this  when to take this   benzonatate 100 MG capsule Commonly known as: TESSALON Take 2 capsules (200 mg total) by mouth 3 (three) times daily as needed for cough.   calcium carbonate 600 MG Tabs tablet Commonly known as: OS-CAL Take 1 tablet (600 mg total) by mouth 2 (two) times daily with a meal.   cetirizine 10 MG tablet Commonly known as: ZYRTEC Take 1 tablet (10 mg total) by mouth daily. What changed:   when to take this  reasons to take this   ferrous sulfate 220 (44 Fe) MG/5ML solution Take 5 mLs (220 mg total) by mouth 3 (three) times daily with meals. What changed: when to take this   fluticasone 50 MCG/ACT nasal spray Commonly known as: FLONASE TWO SPRAYS IN EACH NOSTRIL AT BEDTIME What changed: See the new instructions.   multivitamin tablet Take 1 tablet by mouth daily.   omeprazole 40 MG capsule Commonly known as: PriLOSEC Take 1 capsule (40 mg total) by mouth 2 (two) times daily with a meal.   polyethylene glycol powder 17 GM/SCOOP powder Commonly known as: GLYCOLAX/MIRALAX Take 17 g by mouth 2 (two) times daily as needed.   potassium chloride SA 20 MEQ tablet Commonly known as: K-DUR Take 20 mEq by mouth daily.   sucralfate 1 GM/10ML suspension Commonly known as: Carafate TAKE 52ml (2 TEASPOONSFUL) BY MOUTH 4 TIMES DAILY WITH MEALS AND ATBEDTIME What changed:   how much to take  how to take this  when to take this  additional instructions   vitamin B-12 1000 MCG tablet Commonly known as:  CYANOCOBALAMIN Take 1,000 mcg by mouth daily.   vitamin C 250 MG tablet Commonly known as: ASCORBIC ACID Take 250 mg by mouth daily.    Vitamins A & D 5000-400 units Caps Take 5,000 Units by mouth daily.   zolpidem 5 MG tablet Commonly known as: AMBIEN TAKE ONE TABLET AT BEDTIME AS NEEDED FORSLEEP What changed:   how much to take  how to take this  when to take this      Chronic medications stopped during hospitalization:  diclofenac sodium 1 % Gel Commonly known as: VOLTAREN   furosemide 40 MG tablet Commonly known as: LASIX   hydrochlorothiazide 12.5 MG capsule Commonly known as: MICROZIDE   lisinopril 20 MG tablet Commonly known as: ZESTRIL   pantoprazole 40 MG tablet Commonly known as: PROTONIX   Santyl ointment Generic drug: collagenase   traMADol 50 MG tablet Commonly known as: ULTRAM   triamcinolone ointment 0.5 % Commonly known as: KENALOG    Patient's Medication List was updated in the EMR: yes ---------------------------------------------------------------------------------------------------------------------  --------------------------------------------------------------------------------------------------------------------- ADLs Independent Needs Assistance Dependent  Bathing x    Dressing x    Ambulation x    Toileting x    Eating x     IADL Independent Needs Assistance Dependent  Cooking x    Housework x    Manage Medications x    Manage the telephone x    Shopping for food, clothes, Meds, etc x    Use transportation x    Manage Finances x       SH: no smoking  Review of Systems     Objective:   Physical Exam        Assessment & Plan:

## 2019-07-06 NOTE — Assessment & Plan Note (Addendum)
New problem Stable No evidence of bleeding recurrence by hx or labs.  Dr Carlean Purl repeating EGD soon  Continue Omeprazole 40 mg BID and sulcrafate

## 2019-07-06 NOTE — Assessment & Plan Note (Signed)
Question resolved for now No clinical recurrence of vaginal DC or drainage No evidence of fistula on 05/2019 CT AP Pt advised to watch for vagianl drainage and report to [facility]

## 2019-07-06 NOTE — Assessment & Plan Note (Signed)
Ferritin 50 Resolved Recommended Ms Haugh decreasing her Ferrous sulfate to once a day to limit side effects.

## 2019-07-12 ENCOUNTER — Other Ambulatory Visit: Payer: Self-pay

## 2019-07-12 ENCOUNTER — Ambulatory Visit (INDEPENDENT_AMBULATORY_CARE_PROVIDER_SITE_OTHER): Payer: Medicare HMO | Admitting: Podiatry

## 2019-07-12 DIAGNOSIS — Z5329 Procedure and treatment not carried out because of patient's decision for other reasons: Secondary | ICD-10-CM

## 2019-07-12 NOTE — Progress Notes (Signed)
Pt late asked to reschedule

## 2019-07-13 ENCOUNTER — Telehealth: Payer: Self-pay

## 2019-07-13 DIAGNOSIS — I10 Essential (primary) hypertension: Secondary | ICD-10-CM

## 2019-07-13 MED ORDER — FUROSEMIDE 40 MG PO TABS: 40.0000 mg | ORAL_TABLET | Freq: Every day | ORAL | 0 refills | Status: DC

## 2019-07-13 NOTE — Telephone Encounter (Signed)
Pt calling to get refill of HCTZ. I did not see this on her med list. Pt states she was given this when she left the hospital and is out of the medication. Please send refill to [facility]. If pt is not to take the medication please give her a call at [phone]. Ottis Stain, CMA

## 2019-07-18 MED ORDER — HYDROCHLOROTHIAZIDE 12.5 MG PO CAPS: 12.5000 mg | ORAL_CAPSULE | Freq: Every day | ORAL | 3 refills | Status: DC

## 2019-07-18 NOTE — Telephone Encounter (Signed)
Patient informed.  Jazmin Hartsell,CMA  

## 2019-07-18 NOTE — Telephone Encounter (Signed)
Please let Ms Saintfleur know that the HCTZ was sent into her pharmacy.

## 2019-07-24 ENCOUNTER — Encounter: Payer: Self-pay | Admitting: Internal Medicine

## 2019-07-25 ENCOUNTER — Other Ambulatory Visit: Payer: Self-pay | Admitting: Family Medicine

## 2019-07-27 ENCOUNTER — Telehealth: Payer: Self-pay | Admitting: Internal Medicine

## 2019-07-27 NOTE — Telephone Encounter (Signed)
Left msg to call back for Covid-19 questions:  Do you now or have you had a fever in the last 14 days?       Do you have any respiratory symptoms of shortness of breath or cough now or in the last 14 days?      Do you have any family members or close contacts with diagnosed or suspected Covid-19 in the past 14 days?    Have you been tested for Covid-19 and found to be positive?     Made pt aware that care partner may come to the lobby during the procedure but will need to provide their own mask.

## 2019-07-27 NOTE — Telephone Encounter (Signed)
Instructions reviewed with pt- also sent instructions via MyChart.

## 2019-07-30 ENCOUNTER — Encounter: Payer: Self-pay | Admitting: Internal Medicine

## 2019-07-30 ENCOUNTER — Other Ambulatory Visit: Payer: Self-pay

## 2019-07-30 ENCOUNTER — Other Ambulatory Visit: Payer: Self-pay | Admitting: Internal Medicine

## 2019-07-30 ENCOUNTER — Ambulatory Visit (AMBULATORY_SURGERY_CENTER): Payer: Medicare HMO | Admitting: Internal Medicine

## 2019-07-30 VITALS — BP 138/83 | HR 62 | Temp 98.4°F | Resp 19 | Ht 63.0 in | Wt 214.0 lb

## 2019-07-30 DIAGNOSIS — K219 Gastro-esophageal reflux disease without esophagitis: Secondary | ICD-10-CM | POA: Diagnosis not present

## 2019-07-30 DIAGNOSIS — K284 Chronic or unspecified gastrojejunal ulcer with hemorrhage: Secondary | ICD-10-CM | POA: Diagnosis not present

## 2019-07-30 DIAGNOSIS — D508 Other iron deficiency anemias: Secondary | ICD-10-CM

## 2019-07-30 DIAGNOSIS — K259 Gastric ulcer, unspecified as acute or chronic, without hemorrhage or perforation: Secondary | ICD-10-CM | POA: Diagnosis not present

## 2019-07-30 MED ORDER — OMEPRAZOLE 40 MG PO CPDR: 40.0000 mg | DELAYED_RELEASE_CAPSULE | Freq: Every day | ORAL | 3 refills | Status: DC

## 2019-07-30 MED ORDER — SODIUM CHLORIDE 0.9 % IV SOLN: 500.0000 mL | Freq: Once | INTRAVENOUS | Status: DC

## 2019-07-30 NOTE — Patient Instructions (Addendum)
The ulcer is healed.  I have made the following changes  1) STOP sucralfate  2) change omeprazole to 1 time a day before breakfast - may open the capsule and place in liquid for better absorption   PLEASE COME TO THE LAB HERE AROUND 10/02/2019 AND REPEAT BLOOD COUNT AND IRON LEVELS   PLACE A REMINDER IN YOUR CALENDAR    I appreciate the opportunity to care for you. Gatha Mayer, MD, FACG   YOU HAD AN ENDOSCOPIC PROCEDURE TODAY AT [facility]:   Refer to the procedure report that was given to you for any specific questions about what was found during the examination.  If the procedure report does not answer your questions, please call your gastroenterologist to clarify.  If you requested that your care partner not be given the details of your procedure findings, then the procedure report has been included in a sealed envelope for you to review at your convenience later.  YOU SHOULD EXPECT: Some feelings of bloating in the abdomen. Passage of more gas than usual.  Walking can help get rid of the air that was put into your GI tract during the procedure and reduce the bloating. If you had a lower endoscopy (such as a colonoscopy or flexible sigmoidoscopy) you may notice spotting of blood in your stool or on the toilet paper. If you underwent a bowel prep for your procedure, you may not have a normal bowel movement for a few days.  Please Note:  You might notice some irritation and congestion in your nose or some drainage.  This is from the oxygen used during your procedure.  There is no need for concern and it should clear up in a day or so.  SYMPTOMS TO REPORT IMMEDIATELY:   Following upper endoscopy (EGD)  Vomiting of blood or coffee ground material  New chest pain or pain under the shoulder blades  Painful or persistently difficult swallowing  New shortness of breath  Fever of 100F or higher  Black, tarry-looking stools  For urgent or emergent issues, a  gastroenterologist can be reached at any hour by calling [phone].   DIET:  We do recommend a small meal at first, but then you may proceed to your regular diet.  Drink plenty of fluids but you should avoid alcoholic beverages for 24 hours.  ACTIVITY:  You should plan to take it easy for the rest of today and you should NOT DRIVE or use heavy machinery until tomorrow (because of the sedation medicines used during the test).    FOLLOW UP: Our staff will call the number listed on your records 48-72 hours following your procedure to check on you and address any questions or concerns that you may have regarding the information given to you following your procedure. If we do not reach you, we will leave a message.  We will attempt to reach you two times.  During this call, we will ask if you have developed any symptoms of COVID 19. If you develop any symptoms (ie: fever, flu-like symptoms, shortness of breath, cough etc.) before then, please call [phone].  If you test positive for Covid 19 in the 2 weeks post procedure, please call and report this information to [place].    If any biopsies were taken you will be contacted by phone or by letter within the next 1-3 weeks.  Please call us at [phone] if you have not heard about the biopsies in 3 weeks.  SIGNATURES/CONFIDENTIALITY: You and/or your care partner have signed paperwork which will be entered into your electronic medical record.  These signatures attest to the fact that that the information above on your After Visit Summary has been reviewed and is understood.  Full responsibility of the confidentiality of this discharge information lies with you and/or your care-partner.

## 2019-07-30 NOTE — Progress Notes (Signed)
Temp check by KA/vital check by CW.  Medical and surgical history reviewed with patient.

## 2019-07-30 NOTE — Progress Notes (Signed)
A and O x3. Report to RN. Tolerated MAC anesthesia well.Teeth unchanged after procedure.

## 2019-07-30 NOTE — Op Note (Signed)
[name] [name] Procedure Date: 07/30/2019 12:07 PM MRN: [id number] Endoscopist: Gatha Mayer , MD Age: 63 Referring MD:  Date of Birth: 21-Jan-1956 Gender: Female Account #: [id number] Procedure:                Upper GI endoscopy Indications:              Acute gastrojejunal ulcer with hemorrhage,                            Follow-up of acute gastrojejunal ulcer with                            hemorrhage Medicines:                Propofol per Anesthesia, Monitored Anesthesia Care Procedure:                Pre-Anesthesia Assessment:                           - Prior to the procedure, a History and Physical                            was performed, and patient medications and                            allergies were reviewed. The patient's tolerance of                            previous anesthesia was also reviewed. The risks                            and benefits of the procedure and the sedation                            options and risks were discussed with the patient.                            All questions were answered, and informed consent                            was obtained. Prior Anticoagulants: The patient has                            taken no previous anticoagulant or antiplatelet                            agents. ASA Grade Assessment: III - A patient with                            severe systemic disease. After reviewing the risks                            and benefits, the patient was deemed in  satisfactory condition to undergo the procedure.                           After obtaining informed consent, the endoscope was                            passed under direct vision. Throughout the                            procedure, the patient's blood pressure, pulse, and                            oxygen saturations were monitored continuously. The                            Endoscope was introduced  through the mouth, and                            advanced to the efferent jejunal loop. The upper GI                            endoscopy was accomplished without difficulty. The                            patient tolerated the procedure well. Scope In: Scope Out: Findings:                 Evidence of a gastric bypass was found. A gastric                            pouch with a 4 cm length from the GE junction to                            the gastrojejunal anastomosis was found. The                            gastrojejunal anastomosis was characterized by                            healthy appearing mucosa. This was traversed.                           The examined esophagus was normal.                           The examined jejunum was normal.                           The gastric pouch was normal on retroflexion. Complications:            No immediate complications. Estimated Blood Loss:     Estimated blood loss: none. 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 36-40  CM Gastrojejunal anastomosis characterized by                            healthy appearing mucosa. The gastrojejunal ulcer                            was healed                           - Normal esophagus.                           - Normal examined jejunum.                           - No specimens collected. Recommendation:           - Patient has a contact number available for                            emergencies. The signs and symptoms of potential                            delayed complications were discussed with the                            patient. Return to normal activities tomorrow.                            Written discharge instructions were provided to the                            patient.                           - Resume previous diet.                           - Continue present medications.                           - 1) STOP  SUCRALFATE                           2) REDUCE OMEPRAZOLE CAPSULES YO 1X/DAY - OPEN                            CAPSULE INTO FLUID TO PROMOTE BETTER ABSORPTION                           3) LABS 12/1 - CBC AND FERRITION - ORDERED Gatha Mayer, MD 07/30/2019 12:30:18 PM This report has been signed electronically.

## 2019-07-31 NOTE — Progress Notes (Signed)
Patient came in today to pick up standard Afo brace.  Patient was evaluated for fit and function.   The brace fit very well and there were any complaints of the way it felt once donned.  The brace offered ankle stability in both saggital and coroneal planes.  Patient advised to always wear proper fitting shoes with brace. 

## 2019-08-01 ENCOUNTER — Telehealth: Payer: Self-pay

## 2019-08-01 NOTE — Telephone Encounter (Signed)
Second post procedure follow up call, no answer 

## 2019-08-03 ENCOUNTER — Ambulatory Visit: Payer: Medicare HMO | Admitting: Podiatry

## 2019-08-22 ENCOUNTER — Other Ambulatory Visit: Payer: Self-pay

## 2019-08-23 MED ORDER — FUROSEMIDE 40 MG PO TABS: 40.0000 mg | ORAL_TABLET | Freq: Every day | ORAL | 2 refills | Status: DC

## 2019-09-14 ENCOUNTER — Ambulatory Visit: Payer: Medicare HMO | Admitting: Podiatry

## 2019-09-14 ENCOUNTER — Ambulatory Visit (INDEPENDENT_AMBULATORY_CARE_PROVIDER_SITE_OTHER): Payer: Medicare HMO

## 2019-09-14 ENCOUNTER — Other Ambulatory Visit: Payer: Self-pay | Admitting: Podiatry

## 2019-09-14 ENCOUNTER — Other Ambulatory Visit: Payer: Self-pay

## 2019-09-14 DIAGNOSIS — M249 Joint derangement, unspecified: Secondary | ICD-10-CM | POA: Diagnosis not present

## 2019-09-14 DIAGNOSIS — M24176 Other articular cartilage disorders, unspecified foot: Secondary | ICD-10-CM

## 2019-09-14 DIAGNOSIS — M2141 Flat foot [pes planus] (acquired), right foot: Secondary | ICD-10-CM | POA: Diagnosis not present

## 2019-09-14 DIAGNOSIS — M19071 Primary osteoarthritis, right ankle and foot: Secondary | ICD-10-CM

## 2019-09-14 DIAGNOSIS — M79671 Pain in right foot: Secondary | ICD-10-CM

## 2019-09-14 DIAGNOSIS — M25571 Pain in right ankle and joints of right foot: Secondary | ICD-10-CM

## 2019-09-14 DIAGNOSIS — M2142 Flat foot [pes planus] (acquired), left foot: Secondary | ICD-10-CM

## 2019-09-14 DIAGNOSIS — M6788 Other specified disorders of synovium and tendon, other site: Secondary | ICD-10-CM

## 2019-09-14 DIAGNOSIS — M24173 Other articular cartilage disorders, unspecified ankle: Secondary | ICD-10-CM

## 2019-10-04 DIAGNOSIS — H04123 Dry eye syndrome of bilateral lacrimal glands: Secondary | ICD-10-CM | POA: Diagnosis not present

## 2019-10-04 DIAGNOSIS — H1045 Other chronic allergic conjunctivitis: Secondary | ICD-10-CM | POA: Diagnosis not present

## 2019-10-11 ENCOUNTER — Telehealth: Payer: Self-pay | Admitting: *Deleted

## 2019-10-11 ENCOUNTER — Ambulatory Visit (INDEPENDENT_AMBULATORY_CARE_PROVIDER_SITE_OTHER): Payer: Medicare HMO | Admitting: Family Medicine

## 2019-10-11 ENCOUNTER — Other Ambulatory Visit: Payer: Self-pay

## 2019-10-11 ENCOUNTER — Encounter: Payer: Self-pay | Admitting: Family Medicine

## 2019-10-11 VITALS — BP 134/80 | HR 84 | Ht 63.0 in | Wt 210.0 lb

## 2019-10-11 DIAGNOSIS — R600 Localized edema: Secondary | ICD-10-CM | POA: Diagnosis not present

## 2019-10-11 DIAGNOSIS — M25561 Pain in right knee: Secondary | ICD-10-CM | POA: Diagnosis not present

## 2019-10-11 DIAGNOSIS — E876 Hypokalemia: Secondary | ICD-10-CM | POA: Diagnosis not present

## 2019-10-11 DIAGNOSIS — Z9884 Bariatric surgery status: Secondary | ICD-10-CM | POA: Diagnosis not present

## 2019-10-11 DIAGNOSIS — G4700 Insomnia, unspecified: Secondary | ICD-10-CM | POA: Diagnosis not present

## 2019-10-11 DIAGNOSIS — M7121 Synovial cyst of popliteal space [Baker], right knee: Secondary | ICD-10-CM | POA: Diagnosis not present

## 2019-10-11 DIAGNOSIS — D509 Iron deficiency anemia, unspecified: Secondary | ICD-10-CM | POA: Diagnosis not present

## 2019-10-11 DIAGNOSIS — G8929 Other chronic pain: Secondary | ICD-10-CM | POA: Diagnosis not present

## 2019-10-11 DIAGNOSIS — I10 Essential (primary) hypertension: Secondary | ICD-10-CM

## 2019-10-11 MED ORDER — ZOLPIDEM TARTRATE 5 MG PO TABS: ORAL_TABLET | ORAL | 0 refills | Status: DC

## 2019-10-11 MED ORDER — HYDROCHLOROTHIAZIDE 12.5 MG PO CAPS: 12.5000 mg | ORAL_CAPSULE | Freq: Every day | ORAL | 3 refills | Status: DC

## 2019-10-11 MED ORDER — FERROUS SULFATE 220 (44 FE) MG/5ML PO ELIX: 220.0000 mg | ORAL_SOLUTION | Freq: Every day | ORAL | 2 refills | Status: DC

## 2019-10-11 MED ORDER — POTASSIUM CHLORIDE CRYS ER 20 MEQ PO TBCR: 20.0000 meq | EXTENDED_RELEASE_TABLET | Freq: Every day | ORAL | 99 refills | Status: DC

## 2019-10-11 MED ORDER — FUROSEMIDE 40 MG PO TABS: 40.0000 mg | ORAL_TABLET | Freq: Every day | ORAL | 2 refills | Status: DC

## 2019-10-11 MED ORDER — DOK 100 MG PO CAPS: 100.0000 mg | ORAL_CAPSULE | Freq: Two times a day (BID) | ORAL | 99 refills | Status: DC

## 2019-10-11 NOTE — Telephone Encounter (Signed)
Pt wants to know if PCP can send in something fer her pain or if he wants to wait for results.   To PCP. Christen Bame, CMA

## 2019-10-11 NOTE — Patient Instructions (Signed)
We are checking your kidneys and potassium levels today.  On Saturday, we will get Xrays of your right knee to make sure nothings broken and get an Ultrasound of your right leg to make sure there is no blood clot causing your right leg to swell after you fell on your right knee.   Your blood pressure looks good.  Please stay on your current blood pressure medications.   We will see you back in 4 months.   Your test results will be available on MyChart.  It may take Dr Zuleika Gallus 48 hours to review test results.  He will call you if there is a very abnormal lab.  Otherwise, he will send you a message thru MyChart about your results.

## 2019-10-12 ENCOUNTER — Encounter: Payer: Self-pay | Admitting: Family Medicine

## 2019-10-12 DIAGNOSIS — I872 Venous insufficiency (chronic) (peripheral): Secondary | ICD-10-CM

## 2019-10-12 DIAGNOSIS — R600 Localized edema: Secondary | ICD-10-CM | POA: Insufficient documentation

## 2019-10-12 HISTORY — DX: Venous insufficiency (chronic) (peripheral): I87.2

## 2019-10-12 LAB — BASIC METABOLIC PANEL
BUN/Creatinine Ratio: 23 (ref 12–28)
BUN: 17 mg/dL (ref 8–27)
CO2: 26 mmol/L (ref 20–29)
Calcium: 9.6 mg/dL (ref 8.7–10.3)
Chloride: 102 mmol/L (ref 96–106)
Creatinine, Ser: 0.73 mg/dL (ref 0.57–1.00)
GFR calc Af Amer: 101 mL/min/{1.73_m2} (ref >59)
GFR calc non Af Amer: 88 mL/min/{1.73_m2} (ref >59)
Glucose: 87 mg/dL (ref 65–99)
Potassium: 3.4 mmol/L — ABNORMAL LOW (ref 3.5–5.2)
Sodium: 142 mmol/L (ref 134–144)

## 2019-10-12 NOTE — Assessment & Plan Note (Addendum)
New problem  DDX:  Leg Edema - Ddx:  Venous insufficiency Vs Deep vein thrombosis Vs Knee contussion related/ - Treatment recommendations: diuretics, support stockings - Diagnostic and Monitoring therapy effectiveness and adverse effects plans: Imaging: Venous doppler right leg R/O DVT (low likelihood, high severity); - Contact office or go to ED if condition worsens or fails to improve.

## 2019-10-12 NOTE — Progress Notes (Addendum)
Subjective:    Patient ID: [name], female    DOB: Jul 06, 1956, 63 y.o.   MRN: [id number]  HPI Leg swelling - right leg location - Pt believes she fell onto her bilateral knees about two months ago. She reports she tripped over a child's leg. She states this was about two months ago.  She states it was after she was seen in the ED for right leg pain and swellling on 04/28/19.   - Taking APAP prn with minimal improvement in pain.  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  - Slowly progressive pain and edema.   - locates pain in medial right knee and right medioposterior thigh. No pain in right calf.  No redness in right calf.  - No hx of DVT. On ASA but not anticoagulant.  - No SHOB, no DOE. No chest pain. No cough.   - s/p Bilateral TKRs many years ago by Dr Percell Miller (Ortho).   - 04/28/19 ED visit for right leg edema, right knee pain and bruising over her right knee. She denied trauma. EDP noted trace swelling in both legs, slightly more prominent in right lower legs and knee.   - R Knee Xray (4V) 04/28/2019 in ED visit: "RIGHT knee prosthesis with moderate knee joint effusion." - Right leg venous US 04/29/19 ordered by EDP : "No cystic structure found in the popliteal fossa." - EDP diagnosed possible knee contusion  CHRONIC HYPERTENSION  Disease Monitoring 149/93 at home  Chest pain: no   Dyspnea: no     Medication compliance: yes  Medication Side Effects  Lightheadedness: no  Exercise: Active at work at [employer]  History of Iron Deficiency Anemia - Admission to hospital recently for gastrojejunal anastamosis ulcer. - Taking oral iron solution dailyl  - Taking PPI daily  Insomnia - Started after hospitalization last year - Ms Renbarger reports she cannot sleep without the Ambien   Review of Systems     Objective:   Physical Exam  PHYSICAL EXAM  Vitals:   10/11/19 1001    Weight: 210 lb (95.3 kg)  Height: 5\' 3"  (1.6 m)    VS reviewed GEN: Alert, Cooperative, Groomed, NAD COR: RRR, No M/G/R LUNGS: BCTA, No Acc mm use, speaking in full sentences  EXT: right leg calf49 cm left calf 47 cm.  No pitting.  TTP right medial knee. Intact lat/med lig. . Feet without deformity or lesions. Palpable bilateral pedal pulses.  SKIN: No lesion nor rashes of face/trunk/extremities Neuro: moving limbs spontaneously;Oriented to person, place, and time; Strength: 5/5 Bil. UE and LE symmetric; Sensation: Intact grossly to touch all four extremities; Cerebellar: Finger-to-Nose intact, Rhomberg negative  Gait: Normal speed, nonatlagic  No significant path deviation, Step through +     Assessment & Plan:  Visit Problem List with A/P  Hypertension, benign essential, goal below 140/90 Established problem  Adequate blood pressure control.  No evidence of new end organ damage.  Tolerating medication without significant adverse effects.  Plan to continue current blood pressure regiment.    Leg edema, right New problem  DDX:  Leg Edema - Ddx:  Venous insufficiency Vs Deep vein thrombosis Vs Knee contussion related/ - Treatment recommendations: diuretics, support stockings - Diagnostic and Monitoring therapy effectiveness and adverse effects plans: Imaging: Venous doppler right leg R/O DVT (low likelihood, high severity); - Contact office or go to ED  if condition worsens or fails to improve.      Iron deficiency anemia Established problem. Stable. Continue current oral iron therapy Periodic surveillance or symptoms driven  Knee pain, right anterior New complain Onset after fall onto knees abt two months ago (given ED visit 6/28, it may have been longer ago. .  Right anterior knee pain > left knee pain.  S/P Bilateral TKR  Pt presented to ED 04/30/19 with right knee pain, right leg swelling and bruising over anterior knee Venous US right leg did not show evidence of  DVT 04/29/19 EDV: R Knee [id number] knee prosthesis with moderate knee joint effusion.   Ordered 4-view right knee and Venous doppler to see if anything has devloped since ED visit for right knee pain.  Given recent [place] anastamosis ulceration, will avoid NSAIDs.   Will send in Voltaren gel and a 30-day supply of tramadol.  If pain persists, will need to reconsult her orthopedist.

## 2019-10-12 NOTE — Assessment & Plan Note (Signed)
Established problem. Stable. Continue current oral iron therapy Periodic surveillance or symptoms driven

## 2019-10-12 NOTE — Assessment & Plan Note (Signed)
Established problem  Adequate blood pressure control.  No evidence of new end organ damage.  Tolerating medication without significant adverse effects.  Plan to continue current blood pressure regiment.

## 2019-10-15 MED ORDER — DICLOFENAC SODIUM 1 % EX GEL: 4.0000 g | Freq: Four times a day (QID) | CUTANEOUS | 1 refills | Status: DC

## 2019-10-15 MED ORDER — TRAMADOL HCL 50 MG PO TABS: 50.0000 mg | ORAL_TABLET | Freq: Four times a day (QID) | ORAL | 0 refills | Status: AC | PRN

## 2019-10-15 NOTE — Addendum Note (Signed)
Addended byWendy Poet, Hiral Lukasiewicz D on: 10/15/2019 12:59 PM   Modules accepted: Orders

## 2019-10-15 NOTE — Telephone Encounter (Signed)
Please let Ms Millwee know that I have sent in two medications to help her with her knee pain. 1. Voltaren (diclofenac) gel to rub into knee up to 4 times a day for pain 2. Tramadol tablets. She may take one tablet evry 6 hours, but will need to make them last 30 days.   Please ask Ms Orzech to contact us if her knee pain does not improve.    Also, please ask if Ms Mohr has had her leg venous ultrasound done to rule out DVT?

## 2019-10-15 NOTE — Assessment & Plan Note (Signed)
New complain Onset after fall onto knees abt two months ago (given ED visit 6/28, it may have been longer ago. .  Right anterior knee pain > left knee pain.  S/P Bilateral TKR  Pt presented to ED 04/30/19 with right knee pain, right leg swelling and bruising over anterior knee Venous US right leg did not show evidence of DVT 04/29/19 EDV: R Knee [id number] knee prosthesis with moderate knee joint effusion.   Ordered 4-view right knee and Venous doppler to see if anything has devloped since ED visit for right knee pain.  Given recent [place] anastamosis ulceration, will avoid NSAIDs.   Will send in Voltaren gel and a 30-day supply of tramadol.  If pain persists, will need to reconsult her orthopedist.

## 2019-10-15 NOTE — Telephone Encounter (Signed)
Patient informed of medication regimen and she is scheduled for her ultrasound tomorrow (10-16-2019) at [facility]

## 2019-10-16 ENCOUNTER — Other Ambulatory Visit: Payer: Self-pay

## 2019-10-16 ENCOUNTER — Telehealth: Payer: Self-pay

## 2019-10-16 ENCOUNTER — Ambulatory Visit (HOSPITAL_COMMUNITY)
Admission: RE | Admit: 2019-10-16 | Discharge: 2019-10-16 | Disposition: A | Discharge status: Home / Self Care | Payer: Medicare HMO | Source: Ambulatory Visit | Attending: Cardiovascular Disease | Admitting: Cardiovascular Disease

## 2019-10-16 DIAGNOSIS — R600 Localized edema: Secondary | ICD-10-CM | POA: Diagnosis not present

## 2019-10-16 DIAGNOSIS — M7121 Synovial cyst of popliteal space [Baker], right knee: Secondary | ICD-10-CM

## 2019-10-16 DIAGNOSIS — M25561 Pain in right knee: Secondary | ICD-10-CM

## 2019-10-16 DIAGNOSIS — Z96653 Presence of artificial knee joint, bilateral: Secondary | ICD-10-CM

## 2019-10-16 NOTE — Telephone Encounter (Signed)
Pt had doppler today at [facility]. Negative for DVT. There is moderate amount of complex suprapatellar fluid seen. There is also a complex Baker's cyst measuring about 7.8 x 6.6 x 2.9 cm. Please call pt at [phone] with further instructions/next steps. Ottis Stain, CMA

## 2019-10-17 DIAGNOSIS — M7121 Synovial cyst of popliteal space [Baker], right knee: Secondary | ICD-10-CM

## 2019-10-17 HISTORY — DX: Synovial cyst of popliteal space (Baker), right knee: M71.21

## 2019-10-17 NOTE — Assessment & Plan Note (Signed)
New finding 10/16/19 Venous doppler US right leg found "moderate amount of complex suprapatellar fluid seen. There is also a complex Baker's cyst measuring about 7.8 x 6.6 x 2.9 cm."  No evidence of DVT.  This is a new right leg venous Doppler finding since ED visit 04/28/19 which showed there was no Baker's cysts, nor DVT.   I suspect that the newly developed popliteal cyst is the source of progressive right lower leg edema, and knee pain. Given [name]'s bilateral TKRs, I recommended that she see her orthopedist, Dr Edmonia Lynch, for evaluation and consideration of knee joint aspiration.  We made a referral to him.   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  She denies confusion, sleepiness, clumsiness, and constipation since starting the medications. She has also been prescribed Voltaren gel.

## 2019-10-17 NOTE — Addendum Note (Signed)
Addended byWendy Poet, Sherlyne Crownover D on: 10/17/2019 10:42 AM   Modules accepted: Orders

## 2019-10-17 NOTE — Telephone Encounter (Signed)
I spoke with [name] about the results of the venous dopplers of her right leg that showed a complex Baker's Cyst about the size of a lemon with suprapatellar bursa fluid. Given that [name] has had TKR on the right knee and is followed by Dr Edmonia Lynch (Ortho), will make a referral to Dr Percell Miller for evaluation of the right leg pain and swelling with accompanying complex Baker's Cyst.

## 2019-10-18 ENCOUNTER — Ambulatory Visit: Payer: Medicare HMO | Admitting: Family Medicine

## 2019-10-29 DIAGNOSIS — M25561 Pain in right knee: Secondary | ICD-10-CM | POA: Diagnosis not present

## 2019-12-25 ENCOUNTER — Ambulatory Visit (AMBULATORY_SURGERY_CENTER): Payer: Self-pay

## 2019-12-25 ENCOUNTER — Other Ambulatory Visit: Payer: Self-pay

## 2019-12-25 VITALS — Temp 96.9°F | Ht 63.0 in | Wt 210.0 lb

## 2019-12-25 DIAGNOSIS — Z800 Family history of malignant neoplasm of digestive organs: Secondary | ICD-10-CM

## 2019-12-25 DIAGNOSIS — Z01818 Encounter for other preprocedural examination: Secondary | ICD-10-CM

## 2019-12-25 NOTE — Progress Notes (Signed)

## 2019-12-30 ENCOUNTER — Ambulatory Visit: Payer: Medicare HMO | Attending: Internal Medicine

## 2019-12-30 DIAGNOSIS — Z23 Encounter for immunization: Secondary | ICD-10-CM | POA: Insufficient documentation

## 2019-12-30 NOTE — Progress Notes (Signed)
   Covid-19 Vaccination Clinic  Name:  [name]    MRN: [id number] DOB: [birth year]  12/30/2019  [name] was observed post Covid-19 immunization for 15 minutes without incidence. She was provided with Vaccine Information Sheet and instruction to access the V-Safe system.   [name] was instructed to call 911 with any severe reactions post vaccine: Marland Kitchen Difficulty breathing  . Swelling of your face and throat  . A fast heartbeat  . A bad rash all over your body  . Dizziness and weakness    Immunizations Administered    Name Date Dose VIS Date Route   Pfizer COVID-19 Vaccine 12/30/2019  9:45 AM 0.3 mL 10/12/2019 Intramuscular   Manufacturer: [place]   Lot: [id number]   [place]: [id number]

## 2020-01-08 ENCOUNTER — Encounter: Payer: Medicare HMO | Admitting: Internal Medicine

## 2020-01-08 ENCOUNTER — Ambulatory Visit (INDEPENDENT_AMBULATORY_CARE_PROVIDER_SITE_OTHER): Payer: Medicare HMO

## 2020-01-08 ENCOUNTER — Encounter: Payer: Self-pay | Admitting: Internal Medicine

## 2020-01-08 ENCOUNTER — Other Ambulatory Visit: Payer: Self-pay | Admitting: Internal Medicine

## 2020-01-08 DIAGNOSIS — Z1159 Encounter for screening for other viral diseases: Secondary | ICD-10-CM

## 2020-01-09 LAB — SARS CORONAVIRUS 2 (TAT 6-24 HRS): SARS Coronavirus 2: NEGATIVE

## 2020-01-11 ENCOUNTER — Encounter: Payer: Medicare HMO | Admitting: Internal Medicine

## 2020-01-18 ENCOUNTER — Other Ambulatory Visit: Payer: Self-pay | Admitting: Internal Medicine

## 2020-01-18 ENCOUNTER — Ambulatory Visit (INDEPENDENT_AMBULATORY_CARE_PROVIDER_SITE_OTHER): Payer: Medicare HMO

## 2020-01-18 ENCOUNTER — Other Ambulatory Visit: Payer: Self-pay

## 2020-01-18 DIAGNOSIS — Z1159 Encounter for screening for other viral diseases: Secondary | ICD-10-CM

## 2020-01-19 LAB — SARS CORONAVIRUS 2 (TAT 6-24 HRS): SARS Coronavirus 2: NEGATIVE

## 2020-01-21 ENCOUNTER — Ambulatory Visit (AMBULATORY_SURGERY_CENTER): Payer: Medicare HMO | Admitting: Internal Medicine

## 2020-01-21 ENCOUNTER — Encounter: Payer: Self-pay | Admitting: Internal Medicine

## 2020-01-21 ENCOUNTER — Other Ambulatory Visit: Payer: Self-pay

## 2020-01-21 VITALS — BP 153/81 | HR 75 | Temp 97.8°F | Resp 43 | Ht 63.0 in | Wt 210.0 lb

## 2020-01-21 DIAGNOSIS — Z800 Family history of malignant neoplasm of digestive organs: Secondary | ICD-10-CM | POA: Diagnosis not present

## 2020-01-21 MED ORDER — SODIUM CHLORIDE 0.9 % IV SOLN: 500.0000 mL | Freq: Once | INTRAVENOUS | Status: DC

## 2020-01-21 NOTE — Progress Notes (Signed)
A/ox3, pleased with MAC, report to RN 

## 2020-01-21 NOTE — Op Note (Signed)
[place] [name] Procedure Date: 01/21/2020 12:45 PM MRN: [id number] Endoscopist: Gatha Mayer , MD Age: 64 Referring MD:  Date of Birth: 02/18/1956 Gender: Female Account #: [id number] Procedure:                Flexible Sigmoidoscopy Indications:              Screening in patient at increased risk: Colorectal                            cancer in father 78 Medicines:                Propofol per Anesthesia, Monitored Anesthesia Care Procedure:                Pre-Anesthesia Assessment:                           - Prior to the procedure, a History and Physical                            was performed, and patient medications and                            allergies were reviewed. The patient's tolerance of                            previous anesthesia was also reviewed. The risks                            and benefits of the procedure and the sedation                            options and risks were discussed with the patient.                            All questions were answered, and informed consent                            was obtained. Prior Anticoagulants: The patient has                            taken no previous anticoagulant or antiplatelet                            agents. ASA Grade Assessment: II - A patient with                            mild systemic disease. After reviewing the risks                            and benefits, the patient was deemed in                            satisfactory condition to undergo the procedure.  After obtaining informed consent, the scope was                            passed under direct vision. The Colonoscope was                            introduced through the anus and advanced to the the                            ileo-rectal anastomosis. After obtaining informed                            consent, the scope was passed under direct                            vision.The  colonoscopy was performed without                            difficulty. The patient tolerated the procedure                            well. The quality of the bowel preparation was good. Scope In: 1:35:32 PM Scope Out: 1:40:52 PM Scope Withdrawal Time: 0 hours 2 minutes 59 seconds  Total Procedure Duration: 0 hours 5 minutes 20 seconds  Findings:                 The perianal and digital rectal examinations were                            normal.                           The rectum, recto-sigmoid colon and anastomosis                            appeared normal.                           No additional abnormalities were found on                            retroflexion. Complications:            No immediate complications. Estimated Blood Loss:     Estimated blood loss: none. Impression:               - The rectum, recto-sigmoid colon and colonic                            anastomosis are normal.                           - No specimens collected. Recommendation:           - Patient has a contact number available for  emergencies. The signs and symptoms of potential                            delayed complications were discussed with the                            patient. Return to normal activities tomorrow.                            Written discharge instructions were provided to the                            patient.                           - Repeat flexible sigmoidoscopy in 5 years for                            screening purposes. Father dx CRCA at [place], MD 01/21/2020 1:52:01 PM This report has been signed electronically.

## 2020-01-21 NOTE — Progress Notes (Signed)
Vitals- CW Temp- LC 

## 2020-01-21 NOTE — Patient Instructions (Addendum)
The remaining colon does not show any signs of polyps or cancer.  Next exam 5 years  I appreciate the opportunity to care for you. Gatha Mayer, MD, [facility]  Resume previous diet Return to normal activities   YOU HAD AN ENDOSCOPIC PROCEDURE TODAY AT [facility]:   Refer to the procedure report that was given to you for any specific questions about what was found during the examination.  If the procedure report does not answer your questions, please call your gastroenterologist to clarify.  If you requested that your care partner not be given the details of your procedure findings, then the procedure report has been included in a sealed envelope for you to review at your convenience later.  YOU SHOULD EXPECT: Some feelings of bloating in the abdomen. Passage of more gas than usual.  Walking can help get rid of the air that was put into your GI tract during the procedure and reduce the bloating. If you had a lower endoscopy (such as a colonoscopy or flexible sigmoidoscopy) you may notice spotting of blood in your stool or on the toilet paper. If you underwent a bowel prep for your procedure, you may not have a normal bowel movement for a few days.  Please Note:  You might notice some irritation and congestion in your nose or some drainage.  This is from the oxygen used during your procedure.  There is no need for concern and it should clear up in a day or so.  SYMPTOMS TO REPORT IMMEDIATELY:   Following lower endoscopy (colonoscopy or flexible sigmoidoscopy):  Excessive amounts of blood in the stool  Significant tenderness or worsening of abdominal pains  Swelling of the abdomen that is new, acute  Fever of 100F or higher   For urgent or emergent issues, a gastroenterologist can be reached at any hour by calling [phone]. Do not use MyChart messaging for urgent concerns.    DIET:  We do recommend a small meal at first, but then you may proceed to your regular diet.   Drink plenty of fluids but you should avoid alcoholic beverages for 24 hours.  ACTIVITY:  You should plan to take it easy for the rest of today and you should NOT DRIVE or use heavy machinery until tomorrow (because of the sedation medicines used during the test).    FOLLOW UP: Our staff will call the number listed on your records 48-72 hours following your procedure to check on you and address any questions or concerns that you may have regarding the information given to you following your procedure. If we do not reach you, we will leave a message.  We will attempt to reach you two times.  During this call, we will ask if you have developed any symptoms of COVID 19. If you develop any symptoms (ie: fever, flu-like symptoms, shortness of breath, cough etc.) before then, please call [phone].  If you test positive for Covid 19 in the 2 weeks post procedure, please call and report this information to [place].    If any biopsies were taken you will be contacted by phone or by letter within the next 1-3 weeks.  Please call us at [phone] if you have not heard about the biopsies in 3 weeks.    SIGNATURES/CONFIDENTIALITY: You and/or your care partner have signed paperwork which will be entered into your electronic medical record.  These signatures attest to the fact that that the information above on your After Visit  Summary has been reviewed and is understood.  Full responsibility of the confidentiality of this discharge information lies with you and/or your care-partner. 

## 2020-01-23 ENCOUNTER — Telehealth: Payer: Self-pay

## 2020-01-23 NOTE — Telephone Encounter (Signed)
  Follow up Call-  Call back number 01/21/2020 07/30/2019  Post procedure Call Back phone  # [phone]  Permission to leave phone message Yes Yes  Some recent data might be hidden     Patient questions:  Do you have a fever, pain , or abdominal swelling? No. Pain Score  0 *  Have you tolerated food without any problems? Yes.    Have you been able to return to your normal activities? Yes.    Do you have any questions about your discharge instructions: Diet   No. Medications  No. Follow up visit  No.  Do you have questions or concerns about your Care? No.  Actions: * If pain score is 4 or above: No action needed, pain <4.  C/O headache, advised to increase fluids today, pt agreed.  1. Have you developed a fever since your procedure? no  2.   Have you had an respiratory symptoms (SOB or cough) since your procedure? no  3.   Have you tested positive for COVID 19 since your procedure no  4.   Have you had any family members/close contacts diagnosed with the COVID 19 since your procedure?  no   If yes to any of these questions please route to Joylene John, RN and Erenest Rasher, RN

## 2020-01-29 ENCOUNTER — Ambulatory Visit: Payer: Medicare HMO | Attending: Internal Medicine

## 2020-01-29 DIAGNOSIS — Z23 Encounter for immunization: Secondary | ICD-10-CM

## 2020-01-29 NOTE — Progress Notes (Signed)
   Covid-19 Vaccination Clinic  Name:  [name]    MRN: [id number] DOB: [birth year]  01/29/2020  [name] was observed post Covid-19 immunization for 15 minutes without incident. She was provided with Vaccine Information Sheet and instruction to access the V-Safe system.   [name] was instructed to call 911 with any severe reactions post vaccine: Marland Kitchen Difficulty breathing  . Swelling of face and throat  . A fast heartbeat  . A bad rash all over body  . Dizziness and weakness   Immunizations Administered    Name Date Dose VIS Date Route   Pfizer COVID-19 Vaccine 01/29/2020  8:23 AM 0.3 mL 10/12/2019 Intramuscular   Manufacturer: [place]   Lot: [id number]   [place]: [id number]

## 2020-01-29 NOTE — Progress Notes (Signed)
   Covid-19 Vaccination Clinic  Name:  [name]    MRN: [id number] DOB: [birth year]  01/29/2020  [name] was observed post Covid-19 immunization for 15 minutes without incident. She was provided with Vaccine Information Sheet and instruction to access the V-Safe system.   [name] was instructed to call 911 with any severe reactions post vaccine: Marland Kitchen Difficulty breathing  . Swelling of face and throat  . A fast heartbeat  . A bad rash all over body  . Dizziness and weakness   Immunizations Administered    Name Date Dose VIS Date Route   Pfizer COVID-19 Vaccine 01/29/2020  8:23 AM 0.3 mL 10/12/2019 Intramuscular   Manufacturer: [place]   Lot: [id number]   [place]: [id number]

## 2020-01-31 ENCOUNTER — Other Ambulatory Visit: Payer: Self-pay

## 2020-01-31 ENCOUNTER — Ambulatory Visit (INDEPENDENT_AMBULATORY_CARE_PROVIDER_SITE_OTHER): Payer: Medicare HMO | Admitting: Family Medicine

## 2020-01-31 ENCOUNTER — Encounter: Payer: Self-pay | Admitting: Family Medicine

## 2020-01-31 VITALS — BP 110/70 | HR 90 | Ht 63.0 in | Wt 207.1 lb

## 2020-01-31 DIAGNOSIS — R42 Dizziness and giddiness: Secondary | ICD-10-CM

## 2020-01-31 DIAGNOSIS — R600 Localized edema: Secondary | ICD-10-CM

## 2020-01-31 DIAGNOSIS — K219 Gastro-esophageal reflux disease without esophagitis: Secondary | ICD-10-CM

## 2020-01-31 DIAGNOSIS — Z79899 Other long term (current) drug therapy: Secondary | ICD-10-CM

## 2020-01-31 DIAGNOSIS — M7121 Synovial cyst of popliteal space [Baker], right knee: Secondary | ICD-10-CM | POA: Diagnosis not present

## 2020-01-31 NOTE — Patient Instructions (Signed)
Try increasing your omeprazole to 40 mg capsule twice a day.  See if it helps reduce your acid reflux symptoms.   Wear your tighttest compression hose on your right leg.  Let's see if it can control some of that right leg swelling.  Only take the torsemide water pill once a day.  Taking more than one tablet a day may be lowering your blood pressure too much.  Too low a blood pressure can make people dizzy.    We are chec\king your kidneys and electrolytes today, along with checking you for anemia.

## 2020-02-01 ENCOUNTER — Encounter: Payer: Self-pay | Admitting: Family Medicine

## 2020-02-01 ENCOUNTER — Ambulatory Visit (HOSPITAL_COMMUNITY)
Admission: RE | Admit: 2020-02-01 | Discharge: 2020-02-01 | Disposition: A | Discharge status: Home / Self Care | Payer: Medicare HMO | Source: Ambulatory Visit | Attending: Cardiology | Admitting: Cardiology

## 2020-02-01 DIAGNOSIS — M7121 Synovial cyst of popliteal space [Baker], right knee: Secondary | ICD-10-CM

## 2020-02-01 DIAGNOSIS — R600 Localized edema: Secondary | ICD-10-CM | POA: Insufficient documentation

## 2020-02-01 LAB — BASIC METABOLIC PANEL
BUN/Creatinine Ratio: 26 (ref 12–28)
BUN: 21 mg/dL (ref 8–27)
CO2: 24 mmol/L (ref 20–29)
Calcium: 9.8 mg/dL (ref 8.7–10.3)
Chloride: 105 mmol/L (ref 96–106)
Creatinine, Ser: 0.82 mg/dL (ref 0.57–1.00)
GFR calc Af Amer: 88 mL/min/{1.73_m2} (ref >59)
GFR calc non Af Amer: 76 mL/min/{1.73_m2} (ref >59)
Glucose: 85 mg/dL (ref 65–99)
Potassium: 3.9 mmol/L (ref 3.5–5.2)
Sodium: 145 mmol/L — ABNORMAL HIGH (ref 134–144)

## 2020-02-01 LAB — CBC
Hematocrit: 37.8 % (ref 34.0–46.6)
Hemoglobin: 12.4 g/dL (ref 11.1–15.9)
MCH: 30.2 pg (ref 26.6–33.0)
MCHC: 32.8 g/dL (ref 31.5–35.7)
MCV: 92 fL (ref 79–97)
Platelets: 280 10*3/uL (ref 150–450)
RBC: 4.1 x10E6/uL (ref 3.77–5.28)
RDW: 13.2 % (ref 11.7–15.4)
WBC: 5.6 10*3/uL (ref 3.4–10.8)

## 2020-02-01 NOTE — Progress Notes (Signed)
  [name] is accompanied alone Sources of clinical information for visit is/are patient and past medical records. Nursing assessment for this office visit was reviewed with the patient for accuracy and revision.   Previous Report(s) Reviewed: historical medical records, office notes, operative reports and referral letter/letters  Flex-sig report 01/21/20 Covid virus testing labs March Depression screen [facility] 2/9 01/31/2020  Decreased Interest 0  Down, Depressed, Hopeless 0  PHQ - 2 Score 0  Altered sleeping -  Tired, decreased energy -  Change in appetite -  Feeling bad or failure about yourself  -  Trouble concentrating -  Moving slowly or fidgety/restless -  Suicidal thoughts -  PHQ-9 Score -  Difficult doing work/chores -  Some recent data might be hidden    Fall Risk  01/31/2020 10/11/2019 04/10/2019 06/14/2018 09/02/2017  Falls in the past year? 0 1 0 No No  Number falls in past yr: 0 0 - - -  Injury with Fall? 0 1 - - -  Risk for fall due to : - - - - -    Adult vaccines due  Topic Date Due  . TETANUS/TDAP  07/25/2025    There are no preventive care reminders to display for this patient.    History/P.E. limitations: none  Adult vaccines due  Topic Date Due  . TETANUS/TDAP  07/25/2025   There are no preventive care reminders to display for this patient. There are no preventive care reminders to display for this patient.   Chief Complaint  Patient presents with  . Dizziness    weak      SUBJECTIVE:   CHIEF COMPLAINT / HPI:    Dizziness Feeling dizzy for: yes, couple weeks Precipitants of dizziness: moving from sitting to standing position, prolonged standing Feels like room spins: no Nausea or vomiting with dizziness: no Lightheadedness when stands: yes Palpitations or heart racing: no Hearing Loss: no Ear Pain or fullness: no Headache before or after: no  Medications tried: none  Associated Symptoms  Relevant medications: Recent increase in  Torsemide dose for lower extremity edema  Relevant PMH:  No history CVA, Seizures, CAD and DM,  No Prior history of dizziness History Head Trauma:no   Physical Exam Today's Vitals   01/31/20 1009 01/31/20 1044  BP: 110/64 SITTING 110/70 STANDING  Pulse: 90   SpO2: 99%   Weight: 207 lb 2 oz (94 kg)   Height: 5\' 3"  (1.6 m)   PainSc: 0-No pain    Body mass index is 36.69 kg/m.  Wt Readings from Last 3 Encounters:  01/31/20 207 lb 2 oz (94 kg)  01/21/20 210 lb (95.3 kg)  12/25/19 210 lb (95.3 kg)   General: no distress, alert, cooperative Heart: S1, S2 normal, no murmur, rub or gallop, regular rate and rhythm Extremities: right leg calf circumference greater than left calf circumference.  (+) pitting edema (mostly at ankle) on right leg; trace edema left leg.  Wearing ankle brace on right.    Assessment Visit Problem List with A/P  No problem-specific Assessment & Plan notes found for this encounter.    Lissa Morales, MD [place]

## 2020-02-01 NOTE — Assessment & Plan Note (Signed)
Established problem Uncontrolled increase dose of omeprazole to 40 mg BID for two weeks if can help with increase in her symptomatic heartburn.

## 2020-02-01 NOTE — Assessment & Plan Note (Signed)
New problem Working explanation for dizziness symptom is orthostasis due to self-initiated increase dosage of torsemide in attempting to treat right leg edema.  BMET and CBC were unremarkable except for a Serum sodium 145 c/w inadequate free water which may be consequence of incr loop diuretic use.  [name] reported that increase dose of torsemide did not decrease the right leg swelling appreciably.   Plan: Reduce dose of torsemide to 20 mg daily. Push PO fluids.

## 2020-02-01 NOTE — Assessment & Plan Note (Signed)
Established problem. Stable increase circumference right lower leg compared to left one.   Patient reports evaluation at [facility] office 12/28.  Pt states that they were not concerned about the right leg increase circ/edema.  They performed an knee Xray.  No [place] that she recalled. No comment on whether Baker cyst could contribute to the edema.   Right leg with refluxing right femoral and popliteal venins by Dr Early ([facility]) 07/2016.  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   I suspect that [name]'s bilateral leg edema, R>L, is related to venous insufficiency  Recommend that [name] start wering OTC compression hose.  If [name] is unsatified with the reduction with the OTC (usually 15-20 MMHg compression) compression hose, then can prescirbed fitted graduated stockings.  Discouraged her use of increased dose of torsemide.  May want to see if [name] will try trial of stopping torsemide altogether once adequate compression therapy is achieved.

## 2020-02-04 ENCOUNTER — Telehealth: Payer: Self-pay | Admitting: Family Medicine

## 2020-02-04 DIAGNOSIS — R600 Localized edema: Secondary | ICD-10-CM

## 2020-02-04 DIAGNOSIS — I872 Venous insufficiency (chronic) (peripheral): Secondary | ICD-10-CM

## 2020-02-04 NOTE — Telephone Encounter (Signed)
I discussed the results of [name]'s recent LE right LE venous doppler US results, including the presence of a Baker's cyst which size has remained relatively stable since uncovered in December 2020.   I relayed my suspicion that her right leg swelling is related to the venous hypertension in deepandsuperficial veins or right leg dx by [facility].   Recommended that patient wear graduated compression stockings. Hannah Acors agreed to trial of these prescription hose.  Rx Knee high graduated compression stockings, 20-30 mmHg compression.  Handwritten Rx for stockings sent to [facility] supply shop

## 2020-02-16 ENCOUNTER — Other Ambulatory Visit: Payer: Self-pay | Admitting: Family Medicine

## 2020-02-20 ENCOUNTER — Other Ambulatory Visit: Payer: Self-pay | Admitting: Family Medicine

## 2020-02-20 DIAGNOSIS — Z1231 Encounter for screening mammogram for malignant neoplasm of breast: Secondary | ICD-10-CM

## 2020-03-05 ENCOUNTER — Ambulatory Visit: Payer: Medicare HMO

## 2020-03-12 ENCOUNTER — Ambulatory Visit: Payer: Medicare HMO

## 2020-03-20 ENCOUNTER — Ambulatory Visit: Payer: Medicare HMO | Admitting: Podiatry

## 2020-03-25 ENCOUNTER — Other Ambulatory Visit: Payer: Self-pay | Admitting: Family Medicine

## 2020-03-25 ENCOUNTER — Encounter: Payer: Self-pay | Admitting: Family Medicine

## 2020-03-27 ENCOUNTER — Ambulatory Visit (INDEPENDENT_AMBULATORY_CARE_PROVIDER_SITE_OTHER): Payer: Medicare HMO | Admitting: Family Medicine

## 2020-03-28 ENCOUNTER — Encounter: Payer: Self-pay | Admitting: Family Medicine

## 2020-03-28 ENCOUNTER — Ambulatory Visit
Admission: RE | Admit: 2020-03-28 | Discharge: 2020-03-28 | Disposition: A | Discharge status: Home / Self Care | Payer: Medicare HMO | Source: Ambulatory Visit | Attending: Family Medicine | Admitting: Family Medicine

## 2020-03-28 ENCOUNTER — Other Ambulatory Visit: Payer: Self-pay

## 2020-03-28 DIAGNOSIS — Z1231 Encounter for screening mammogram for malignant neoplasm of breast: Secondary | ICD-10-CM

## 2020-03-28 NOTE — Progress Notes (Signed)
Chart opened in error

## 2020-04-01 ENCOUNTER — Telehealth: Payer: Self-pay

## 2020-04-01 NOTE — Telephone Encounter (Signed)
Received fax from pharmacy, PA needed on Sucralfate. Clinical questions placed in providers for review.  Please return to RN clinic once completed.  Cover My Meds info: Key: [id number]

## 2020-04-04 NOTE — Telephone Encounter (Signed)
PA completed and placed in fax box.

## 2020-04-08 ENCOUNTER — Encounter: Payer: Self-pay | Admitting: Family Medicine

## 2020-04-08 DIAGNOSIS — K284 Chronic or unspecified gastrojejunal ulcer with hemorrhage: Secondary | ICD-10-CM | POA: Insufficient documentation

## 2020-04-17 ENCOUNTER — Ambulatory Visit: Payer: Medicare HMO | Admitting: Family Medicine

## 2020-05-01 ENCOUNTER — Encounter: Payer: Self-pay | Admitting: Family Medicine

## 2020-05-01 ENCOUNTER — Ambulatory Visit (INDEPENDENT_AMBULATORY_CARE_PROVIDER_SITE_OTHER): Payer: Medicare HMO | Admitting: Family Medicine

## 2020-05-01 ENCOUNTER — Other Ambulatory Visit: Payer: Self-pay

## 2020-05-01 VITALS — BP 120/74 | Ht 63.0 in | Wt 208.0 lb

## 2020-05-01 DIAGNOSIS — Z833 Family history of diabetes mellitus: Secondary | ICD-10-CM | POA: Diagnosis not present

## 2020-05-01 DIAGNOSIS — I872 Venous insufficiency (chronic) (peripheral): Secondary | ICD-10-CM

## 2020-05-01 DIAGNOSIS — Z131 Encounter for screening for diabetes mellitus: Secondary | ICD-10-CM

## 2020-05-01 DIAGNOSIS — M7918 Myalgia, other site: Secondary | ICD-10-CM

## 2020-05-01 DIAGNOSIS — E1169 Type 2 diabetes mellitus with other specified complication: Secondary | ICD-10-CM | POA: Diagnosis not present

## 2020-05-01 LAB — POCT GLYCOSYLATED HEMOGLOBIN (HGB A1C): Hemoglobin A1C: 5.5 % (ref 4.0–5.6)

## 2020-05-01 NOTE — Patient Instructions (Signed)
I believe this is a trigger point in your rhomboid muscles of your shoulder.  Please do the stretching exercises for these muscles. 4 times a day.  If not better within two weeks, please let us know.  We will refer you to Physical Therapy evening sessions.   We are checking you Hemoglobin A1c today to look for diabetes.

## 2020-05-04 NOTE — Progress Notes (Signed)
  Subjective:  Patient ID: [name], female    DOB: [birth year],  MRN: [id number]  Chief Complaint  Patient presents with  . Foot Pain    Pt states right entire foot pain. Pt states her boot helps her with walking but she has throbbing pain when it is removed.  . Toe Pain    Pt states bilateral 5th digit pain.    64 y.o. female presents with the above complaint. History confirmed with patient.   Objective:  Physical Exam: warm, good capillary refill, no trophic changes or ulcerative lesions, normal DP and PT pulses and normal sensory exam. Right Foot: Pes planus, pain to palpation about the sinus tarsi, dorsal midfoot  No images are attached to the encounter.  Radiographs: X-ray of the right foot: no fracture, dislocation, swelling or degenerative changes noted Assessment:   1. Joint derangement of ankle or foot   2. Arthrosis of midfoot, right   3. Sinus tarsi syndrome of right ankle   4. Pes planus of both feet   5. Achilles tendinosis of both lower extremities    Plan:  Patient was evaluated and treated and all questions answered.  Pes planus, sinus tarsitis, fifth toes hammer toes -Continue boot as needed however she would benefit from transition to orthotics discussed OTC versus custom orthotics discussed noncoverage of custom orthotics  No follow-ups on file.

## 2020-05-05 ENCOUNTER — Encounter: Payer: Self-pay | Admitting: Family Medicine

## 2020-05-05 DIAGNOSIS — R7303 Prediabetes: Secondary | ICD-10-CM

## 2020-05-05 DIAGNOSIS — E1169 Type 2 diabetes mellitus with other specified complication: Secondary | ICD-10-CM | POA: Insufficient documentation

## 2020-05-05 DIAGNOSIS — E119 Type 2 diabetes mellitus without complications: Secondary | ICD-10-CM | POA: Insufficient documentation

## 2020-05-05 DIAGNOSIS — E118 Type 2 diabetes mellitus with unspecified complications: Secondary | ICD-10-CM | POA: Insufficient documentation

## 2020-05-05 HISTORY — DX: Prediabetes: R73.03

## 2020-05-05 HISTORY — DX: Type 2 diabetes mellitus without complications: E11.9

## 2020-05-05 NOTE — Progress Notes (Signed)
°  CPT E&M Office Visit Time Before Visit; reviewing medical records (e.g. recent visits, labs, studies): 5 minutes During Visit (F2F time): 12 minutes After Visit (discussion with family or HCP, prescribing, ordering, referring, calling result/recommendations or documenting on same day): 5 minutes Total Visit Time: 22 minutes  Level 2 Est: 10-19 min     New: 15-29 min Level 3 Est: 20-29 min     New: 30-44 min Level 4 Est: 30-39 min     New: 45-59 min Level 5 Est: 40-54 min     New: 60-74 min   > Level 5 - see prolonged service CPT E&M codes; 99XXX   [name] is alone Sources of clinical information for visit is/are patient and past medical records. Nursing assessment for this office visit was reviewed with the patient for accuracy and revision.     Previous Report(s) Reviewed: office notes and radiology reports  Depression screen [facility] 2/9 05/01/2020  Decreased Interest 0  Down, Depressed, Hopeless 1  PHQ - 2 Score 1  Altered sleeping -  Tired, decreased energy -  Change in appetite -  Feeling bad or failure about yourself  -  Trouble concentrating -  Moving slowly or fidgety/restless -  Suicidal thoughts -  PHQ-9 Score -  Difficult doing work/chores -  Some recent data might be hidden    Fall Risk  01/31/2020 10/11/2019 04/10/2019 06/14/2018 09/02/2017  Falls in the past year? 0 1 0 No No  Number falls in past yr: 0 0 - - -  Injury with Fall? 0 1 - - -  Risk for fall due to : - - - - -    PHQ9 SCORE ONLY 05/01/2020 01/31/2020 10/11/2019  PHQ-9 Total Score 1 0 0    Adult vaccines due  Topic Date Due   TETANUS/TDAP  07/25/2025    There are no preventive care reminders to display for this patient.    History/P.E. limitations: none  Adult vaccines due  Topic Date Due   TETANUS/TDAP  07/25/2025   There are no preventive care reminders to display for this patient. There are no preventive care reminders to display for this patient.   Chief Complaint  Patient  presents with   Shoulder Pain    right shoulder pain   Leg Swelling

## 2020-05-05 NOTE — Assessment & Plan Note (Signed)
New problem Trigger point right rhomboid muscle complet Trial of stretching and gentle strengthening exercises (provided) ICE/heat/APAP prn If not improved in 2 weeks, asked that [facility] contact office for referral to evening PT eval and tx.

## 2020-05-05 NOTE — Assessment & Plan Note (Addendum)
Right leg with refluxing right femoral and popliteal venins by Dr Early ([facility]) 07/2016.  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   Responsive to compression hose.   Taking Lasix 40 mg only once a day now. Will discuss cutting down to 20 mg next visit.

## 2020-05-05 NOTE — Assessment & Plan Note (Signed)
Wt Readings from Last 3 Encounters:  05/01/20 208 lb (94.3 kg)  01/31/20 207 lb 2 oz (94 kg)  01/21/20 210 lb (95.3 kg)   Established problem. Stable. Continue lifestyle changes

## 2020-05-05 NOTE — Addendum Note (Signed)
Addended byWendy Poet, Reyne Falconi D on: 05/05/2020 11:02 AM   Modules accepted: Level of Service

## 2020-05-05 NOTE — Assessment & Plan Note (Addendum)
Lab Results  Component Value Date   HGBA1C 5.5 05/01/2020  Established problem Controlled Continue diet control

## 2020-05-14 NOTE — Progress Notes (Signed)
Subjective:  Patient ID: [name], female    DOB: 06-19-1956,  MRN: [id number]  No chief complaint on file.   64 y.o. female presents with the above complaint. States it is doing better with the brace and hte injection.  Review of Systems: Negative except as noted in the HPI. Denies N/V/F/Ch.  Past Medical History:  Diagnosis Date  . AC (acromioclavicular) joint arthritis 12/23/2017   Left side  . Acute renal failure ([place]) 09/24/2016  . Allergy   . Anemia   . Arthritis    knees  . Back pain 10/04/2012  . Baker's cyst of knee, right 10/17/2019  . Baker's cyst of knee, right 10/17/2019  . Biliary sludge 04/08/2014   Taking ursodiol chronically    . Blood in stool 10/07/2014  . Blood transfusion without reported diagnosis   . Bunion 03/28/2014  . Cellulitis of leg, left 09/14/2016  . Cervical pain (neck) 03/26/2016  . Chest pain 05/12/2015  . Constipation 10/19/2013   Chronic in the setting of known diverticulosis and history of bleeds.    . Cramping of hands 02/27/2016  . CYST, KIDNEY, ACQUIRED 08/15/2007   Qualifier: Diagnosis of  By: Mellody Drown MD, [facility]    . 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 Degenerative arthritis of right shoulder region 08/2013  . Diverticulosis   . Diverticulosis of colon with hemorrhage    Hx  Of recurrent Diverticular bleeding - several prior admissions   . Diverticulosis of colon with hemorrhage,  s/p colectomy ileorectal anastamosis    Hx  Of recurrent Diverticular bleeding - 4 to 5  prior admissions for GI bleeding 2016 and before. Seen inhouse by [facility] in 2016  . Diverticulosis of colon without hemorrhage 11/18/2014   Noted on colonoscopy 11/2014   . External hemorrhoids without complication 02/25/622  . Gastrojejunal ulcer with hemorrhage   . Gastrojejunal ulcer with hemorrhage   . GERD (gastroesophageal reflux disease)   . High cholesterol   . History of GI bleed   .  History of iron deficiency anemia 12/29/2006      . History of stroke 02/13/2014   Overview:  Dx on MRI when eval for h/a but denies any sxs  . HX of Diverticulosis of colon with hemorrhage,  s/p colectomy ileorectal anastamosis    Hx  Of recurrent Diverticular bleeding - 4 to 5  prior admissions for GI bleeding 2016 and before. Seen inhouse by [facility] in 2016  . Hx of gastrointestinal hemorrhage 05/18/2019   Extending back 2001, 2007 bleeds. 05/2013 colonoscopy.  Dr. Deatra Ina.  For hematochezia, first-degree relative with colon cancer, personal history adenomatous colon polyps.  Sessile, sigmoid polyp removed; path consistent with inflammatory polyp..  Dense pandiverticulosis.  No fresh or old blood encountered.  Presumed to have had diverticular bleed. 11/2014 colonoscopy For hematochezia.  Pandiverticul  . Hypertension    under control with meds., has been on med. > 20 yr.  . Hypopigmented skin lesion 02/23/2011  . Ileus following gastrointestinal surgery ([place]) 12/12/2015  . Intractable vomiting 09/24/2016  . Leg cramping 04/22/2014  . Lipoma of lower extremity 03/07/2013   Overview:  RIGHT THIGH  . Lower GI bleed   . Lumbar back pain 03/26/2016  . Medial meniscus tear 1997   Right Knee  . Mini stroke ([place])   . Morbid obesity ([place]) 12/29/2006  . Multiple open wounds of lower leg 11/12/2016  . Muscle pain 05/26/2015  .  Orthostatic dizziness 08/26/2010   Qualifier: Diagnosis of  By: Hyman Hopes    . Premature supraventricular beats 12/25/2015   EKG 12/25/15   . Rash and nonspecific skin eruption 04/01/2017  . Right knee prosthesis with joint effusion ([place]) 05/18/2019   CT knee 04/2019 :RIGHT knee prosthesis with moderate knee joint effusion  . Rotator cuff rupture, complete 08/2013   right  . Shoulder impingement 08/2013   right  . Skin lesion of right lower extremity 05/27/2016  . Small bowel obstruction ([place]) 12/12/2015  . Snoring 08/24/2018  . Stroke [facility]) 2006   Remote left lacunar  infarct noted on CT head 2006, 2010   . Subacromial bursitis 05/21/2013  . Superficial thrombophlebitis 01/29/2016  . 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 Tingling 11/17/2017  . Type 2 diabetes mellitus with other specified complication, without long-term current use of insulin ([place])   . Ulcer   . Ulcer at site of surgical anastomosis following bypass of stomach 05/18/2019  . Vaginal bleeding 05/17/2019   See 03/12/2019 Telephone call note and Office visit 03/23/2019 with Dorcas Mcmurray MD in [facility] clinic  . Venous stasis ulcer of left ankle with fat layer exposed with varicose veins ([place]) 10/20/2016  . Wears dentures    upper  . Wears partial dentures    lower    Current Outpatient Medications:  .  ascorbic acid (VITAMIN C) 250 MG tablet, Take by mouth., Disp: , Rfl:  .  aspirin 81 MG chewable tablet, Chew 81 mg by mouth daily., Disp: , Rfl:  .  atorvastatin (LIPITOR) 40 MG tablet, Take 1 tablet (40 mg total) by mouth daily at 6 PM. TAKE ONE TABLET DAILY, Disp: 90 tablet, Rfl: 3 .  benzonatate (TESSALON) 100 MG capsule, Take 2 capsules (200 mg total) by mouth 3 (three) times daily as needed for cough., Disp: 20 capsule, Rfl: 0 .  calcium carbonate (OS-CAL) 600 MG TABS tablet, Take 1 tablet (600 mg total) by mouth 2 (two) times daily with a meal., Disp: 180 tablet, Rfl: 1 .  cetirizine (ZYRTEC) 10 MG tablet, Take 1 tablet (10 mg total) by mouth daily. (Patient taking differently: Take 10 mg by mouth daily as needed for allergies. ), Disp: 30 tablet, Rfl: 11 .  diclofenac Sodium (VOLTAREN) 1 % GEL, Apply 4 g topically 4 (four) times daily. Apply to knees. Rub in well., Disp: 150 g, Rfl: 1 .  DOK 100 MG capsule, Take 1 capsule (100 mg total) by mouth 2 (two) times daily., Disp: 60 capsule, Rfl: prn .  ferrous sulfate 220 (44 Fe) MG/5ML solution, Take 5 mLs (220 mg total) by  mouth daily with breakfast., Disp: 473 mL, Rfl: 2 .  fluticasone (FLONASE) 50 MCG/ACT nasal spray, TWO SPRAYS IN EACH NOSTRIL AT BEDTIME (Patient taking differently: Place 2 sprays into both nostrils 3 (three) times daily as needed for allergies. ), Disp: 16 g, Rfl: 1 .  furosemide (LASIX) 40 MG tablet, Take 1 tablet (40 mg total) by mouth daily., Disp: 30 tablet, Rfl: 2 .  hydrochlorothiazide (MICROZIDE) 12.5 MG capsule, Take 1 capsule (12.5 mg total) by mouth daily., Disp: 90 capsule, Rfl: 3 .  Multiple Vitamin (MULTIVITAMIN) tablet, Take 1 tablet by mouth daily., Disp: 90 tablet, Rfl: 1 .  omeprazole (PRILOSEC) 40 MG capsule, Take 1 capsule (40 mg total) by mouth daily before breakfast. Open capsule  and place in liquid when possible, Disp: 90 capsule, Rfl: 3 .  potassium chloride SA (KLOR-CON) 20 MEQ tablet, Take 1 tablet (20 mEq total) by mouth daily., Disp: 90 tablet, Rfl: prn .  sucralfate (CARAFATE) 1 GM/10ML suspension, TAKE 69ml (2 TEASPOONSFUL) BY MOUTH 4 TIMES DAILY WITH MEALS AND AT BEDTIME, Disp: , Rfl:  .  vitamin B-12 (CYANOCOBALAMIN) 1000 MCG tablet, Take 1,000 mcg by mouth daily., Disp: , Rfl:  .  vitamin C (ASCORBIC ACID) 250 MG tablet, Take 250 mg by mouth daily., Disp: , Rfl:  .  vitamin E 180 MG (400 UNITS) capsule, Take by mouth., Disp: , Rfl:  .  Vitamins A & D 5000-400 units CAPS, Take 5,000 Units by mouth daily., Disp: , Rfl:  .  zolpidem (AMBIEN) 5 MG tablet, TAKE ONE TABLET AT BEDTIME AS NEEDED FORSLEEP, Disp: 25 tablet, Rfl: 0  Social History   Tobacco Use  Smoking Status Former Smoker  . Packs/day: 0.10  . Years: 6.00  . Pack years: 0.60  . Quit date: 14  . Years since quitting: 45.5  Smokeless Tobacco Never Used    Allergies  Allergen Reactions  . Aspirin Other (See Comments)    CAN NOT TAKE DUE TO GI BLEED GI BLEED  . Pantoprazole Sodium     NEEDS CAPSULE OR DISSOLVABLE PPI - TABLET WILL NOT ABSORB WELL S/P GASTRIC BYPASS  . Nsaids Other (See  Comments)    GI BLEED  . Tolmetin Other (See Comments)    GI BLEED   Objective:   There were no vitals filed for this visit. There is no height or weight on file to calculate BMI. Constitutional Well developed. Well nourished.  Vascular Dorsalis pedis pulses palpable bilaterally. Posterior tibial pulses palpable bilaterally. Capillary refill normal to all digits.  No cyanosis or clubbing noted. Pedal hair growth normal.  Neurologic Normal speech. Oriented to person, place, and time. Epicritic sensation to light touch grossly present bilaterally.  Dermatologic Nails well groomed and normal in appearance. No open wounds. No skin lesions.  Orthopedic: POP R sinus tarsi, POP dorsal TMTs, POP R PT tendon    Assessment:   1. Sinus tarsi syndrome of right ankle   2. Arthrosis of midfoot, right    Plan:  Patient was evaluated and treated and all questions answered.  Pes planus with sinus tarsitis, Right; midfoot OA. -Improving, no injection today. -Continue brace. Will make appt for custom brace.  Return in about 6 weeks (around 06/22/2019).

## 2020-05-21 ENCOUNTER — Encounter: Payer: Self-pay | Admitting: Family Medicine

## 2020-05-21 LAB — HM DIABETES EYE EXAM

## 2020-05-22 ENCOUNTER — Other Ambulatory Visit: Payer: Self-pay | Admitting: Family Medicine

## 2020-05-28 ENCOUNTER — Other Ambulatory Visit: Payer: Self-pay | Admitting: Family Medicine

## 2020-07-11 ENCOUNTER — Telehealth: Payer: Self-pay

## 2020-07-11 NOTE — Telephone Encounter (Signed)
I discussed with Lona Millard, RN, and I agree with her assessment and plan.

## 2020-07-11 NOTE — Telephone Encounter (Signed)
Patient calls nurse line regarding nose bleed with clots. Patient reports nose bleed lasting for over an hour, despite applying pressure. Patient is calling nurse line for additional advice on getting bleeding to stop.   Spoke with Dr. McDiarmid regarding patient. Advised patient to proceed to UC as soon as possible for evaluation for potential packing.   Patient denies lightheadedness of dizziness, no LOC or injury precipitated nose bleed.   Patient verbalizes understanding of instructions and will proceed for evaluation once she is able to find coverage at work.   Talbot Grumbling, RN

## 2020-07-30 ENCOUNTER — Other Ambulatory Visit: Payer: Self-pay | Admitting: Family Medicine

## 2020-08-16 ENCOUNTER — Ambulatory Visit: Payer: Self-pay

## 2020-08-16 ENCOUNTER — Ambulatory Visit: Payer: Medicare HMO | Attending: Internal Medicine

## 2020-08-16 DIAGNOSIS — Z23 Encounter for immunization: Secondary | ICD-10-CM

## 2020-08-16 NOTE — Progress Notes (Signed)
° °  Covid-19 Vaccination Clinic  Name:  Brandie Lopes    MRN: [id number] DOB: [birth year]  08/16/2020  Ms. Castner was observed post Covid-19 immunization for 15 minutes without incident. She was provided with Vaccine Information Sheet and instruction to access the V-Safe system.   Ms. Mattson was instructed to call 911 with any severe reactions post vaccine:  Difficulty breathing   Swelling of face and throat   A fast heartbeat   A bad rash all over body   Dizziness and weakness

## 2020-08-19 ENCOUNTER — Other Ambulatory Visit: Payer: Self-pay | Admitting: Internal Medicine

## 2020-08-21 ENCOUNTER — Other Ambulatory Visit: Payer: Self-pay | Admitting: Family Medicine

## 2020-08-26 DIAGNOSIS — H209 Unspecified iridocyclitis: Secondary | ICD-10-CM

## 2020-08-26 HISTORY — DX: Unspecified iridocyclitis: H20.9

## 2020-09-02 ENCOUNTER — Other Ambulatory Visit: Payer: Self-pay

## 2020-09-02 ENCOUNTER — Ambulatory Visit (INDEPENDENT_AMBULATORY_CARE_PROVIDER_SITE_OTHER): Payer: Medicare HMO

## 2020-09-02 DIAGNOSIS — Z23 Encounter for immunization: Secondary | ICD-10-CM

## 2020-09-18 ENCOUNTER — Ambulatory Visit: Payer: Medicare HMO | Admitting: Family Medicine

## 2020-10-06 ENCOUNTER — Telehealth: Payer: Self-pay

## 2020-10-06 DIAGNOSIS — M47892 Other spondylosis, cervical region: Secondary | ICD-10-CM | POA: Diagnosis not present

## 2020-10-06 DIAGNOSIS — M542 Cervicalgia: Secondary | ICD-10-CM | POA: Diagnosis not present

## 2020-10-06 NOTE — Telephone Encounter (Signed)
Patient calls nurse line regarding fluctuating blood sugar levels. Patient reports that over the weekend her blood sugars have been ranging from 80s- 180s. Patient reports that she has not had any new changes in diet and that she is checking blood sugars before and after meals.   Patient does not currently take any medications for blood sugar. Scheduled patient follow up appointment for first available time that patient could come into the office (12/16). Advised patient that if she began having symptoms or drastic increase or decrease of numbers to call back to office to be seen sooner. Also advised patient to keep a detailed log of food/drink intake and blood sugar readings.   Patient will follow up with Dr. Vanessa [place] on 12/16, PCP no availabilities until 12/30.  Will route note to PCP and Dr. Vanessa [place].   Talbot Grumbling, RN

## 2020-10-07 NOTE — Telephone Encounter (Signed)
Reviewed and agree.

## 2020-10-15 DIAGNOSIS — M47892 Other spondylosis, cervical region: Secondary | ICD-10-CM | POA: Diagnosis not present

## 2020-10-15 DIAGNOSIS — M542 Cervicalgia: Secondary | ICD-10-CM | POA: Diagnosis not present

## 2020-10-15 NOTE — Progress Notes (Signed)
    SUBJECTIVE:   CHIEF COMPLAINT / HPI:   Type 2 diabetes-concern for elevated blood sugar Patient is a 64 year old female that presents for evaluation of elevated blood glucose. She states that she was checking her blood sugars over the weekend which were ranging from the 80s to 180s and has not made any changes in her diet. She is not currently on any medication for blood glucose control. During the phone encounter it was plan for the patient to keep a detailed log of her food and drink intake as well as her blood sugar readings. Previous A1c in July 2020 of 5.5. Upon chart review the patient's highest A1c 6.5, 6.6 back in 2010 and since then has never been above 6.3. Current A1c of 5.8. Today the patient states she has gained some weight since Covid19 pandemic. Thinks she's gained about 40 lbs over 2 years since the pandemic started. She states that she had been checking her sugars on occasion when she felt unwell to see if there was any relation between them. She is not on any diabetic medications and will sometimes see numbers as high as the 180s immediately after meals.  PERTINENT  PMH / PSH: Type 2 diabetes  OBJECTIVE:   BP 130/80   Pulse 90   Ht 5\' 3"  (1.6 m)   Wt 218 lb 4 oz (99 kg)   SpO2 96%   BMI 38.66 kg/m    General: NAD, pleasant, able to participate in exam Cardiac: RRR, no murmurs. Respiratory: CTAB, normal effort Abdomen: Bowel sounds present, nontender Psych: Normal affect and mood  ASSESSMENT/PLAN:   Type 2 diabetes mellitus with other specified complication, without long-term current use of insulin [facility]) Assessment: 64 year old female with a previous history of type 2 diabetes but with blood sugars in the normal to prediabetic range since about 2013. A1c 6 months ago was 5.5, A1c today of 5.8. Patient is on no medications. Patient is technically in the prediabetic range due to this blood sugar. Patient has been checking her sugars at home sometimes immediately  after meals and was seen numbers as high as the 180s. She states that sometimes she would feel unwell and would check her sugar to see if there was any relation to her sugar and how she was feeling. Patient states she has gained around 30 to 40 pounds over the 2 years of the COVID-19 pandemic due to the fact that she is felt hesitant to go to the gym. Plan: -Discussed continue with no medications -Recommended and spoke in detail about increasing activity via exercise as well as watching diet and focusing on a healthy diet to lose weight. -Discussed modalities for patient to do exercise such as going to the park to walk, using YouTube videos for Pilates and yoga exercises which do not need a lot of equipment, and other modalities that she can do around the home. -Recommended following up in about 6 months for recheck of A1c.     Lurline Del, [place]    This note was prepared using Dragon voice recognition software and may include unintentional dictation errors due to the inherent limitations of voice recognition software.

## 2020-10-15 NOTE — Patient Instructions (Signed)
It was great to see you! Thank you for allowing me to participate in your care!  Our plans for today:  -Today we checked your A1c. Your previous A1c was 5.5, today it was 5.8. You do not need to take any medications for this. I do recommend that she continue working on being active and eating a well-balanced and healthy diet. -I recommend that we recheck your A1c in about 6 months..  Take care and seek immediate care sooner if you develop any concerns.   Dr. Lurline Del, [place]

## 2020-10-16 ENCOUNTER — Other Ambulatory Visit: Payer: Self-pay

## 2020-10-16 ENCOUNTER — Encounter: Payer: Self-pay | Admitting: Family Medicine

## 2020-10-16 ENCOUNTER — Ambulatory Visit (INDEPENDENT_AMBULATORY_CARE_PROVIDER_SITE_OTHER): Payer: Medicare HMO | Admitting: Family Medicine

## 2020-10-16 VITALS — BP 130/80 | HR 90 | Ht 63.0 in | Wt 218.2 lb

## 2020-10-16 DIAGNOSIS — E1169 Type 2 diabetes mellitus with other specified complication: Secondary | ICD-10-CM

## 2020-10-16 LAB — POCT GLYCOSYLATED HEMOGLOBIN (HGB A1C): Hemoglobin A1C: 5.8 % — AB (ref 4.0–5.6)

## 2020-10-16 NOTE — Assessment & Plan Note (Signed)
Assessment: 64 year old female with a previous history of type 2 diabetes but with blood sugars in the normal to prediabetic range since about 2013. A1c 6 months ago was 5.5, A1c today of 5.8. Patient is on no medications. Patient is technically in the prediabetic range due to this blood sugar. Patient has been checking her sugars at home sometimes immediately after meals and was seen numbers as high as the 180s. She states that sometimes she would feel unwell and would check her sugar to see if there was any relation to her sugar and how she was feeling. Patient states she has gained around 30 to 40 pounds over the 2 years of the COVID-19 pandemic due to the fact that she is felt hesitant to go to the gym. Plan: -Discussed continue with no medications -Recommended and spoke in detail about increasing activity via exercise as well as watching diet and focusing on a healthy diet to lose weight. -Discussed modalities for patient to do exercise such as going to the park to walk, using YouTube videos for Pilates and yoga exercises which do not need a lot of equipment, and other modalities that she can do around the home. -Recommended following up in about 6 months for recheck of A1c.

## 2020-11-19 DIAGNOSIS — Z9884 Bariatric surgery status: Secondary | ICD-10-CM | POA: Diagnosis not present

## 2020-11-28 ENCOUNTER — Other Ambulatory Visit: Payer: Self-pay | Admitting: Family Medicine

## 2020-12-26 DIAGNOSIS — M25562 Pain in left knee: Secondary | ICD-10-CM | POA: Diagnosis not present

## 2020-12-26 DIAGNOSIS — M25561 Pain in right knee: Secondary | ICD-10-CM | POA: Diagnosis not present

## 2021-01-22 ENCOUNTER — Other Ambulatory Visit: Payer: Self-pay | Admitting: Family Medicine

## 2021-01-22 DIAGNOSIS — I10 Essential (primary) hypertension: Secondary | ICD-10-CM

## 2021-02-09 ENCOUNTER — Telehealth: Payer: Self-pay

## 2021-02-09 NOTE — Telephone Encounter (Signed)
Please contact [name] to ask the reason she is having her surgery and has Dr Arlyn Buerkle ever seen her for this reason.  Unfortunately, if Dr Mertha Clyatt has not seen her for this condition requiring surgery, he cannot make a referral to a surgeon to treat the condition.    Thank you.

## 2021-02-09 NOTE — Telephone Encounter (Signed)
Patient calls nurse line stating she is having minor surgery done to her eye. Patient reports she is going to [facility] and the procedure is scheduled for next week. Patient reports they need a referral from provider. Please advise.

## 2021-02-09 NOTE — Telephone Encounter (Signed)
Spoke with pt she stated that she has an knot over her right eye and has been having headaches. She made an appt to see Dr. Wendy Poet on 4/28 about this. Salvatore Marvel, CMA

## 2021-02-21 ENCOUNTER — Other Ambulatory Visit: Payer: Self-pay | Admitting: Family Medicine

## 2021-02-26 ENCOUNTER — Ambulatory Visit (INDEPENDENT_AMBULATORY_CARE_PROVIDER_SITE_OTHER): Payer: Medicare HMO | Admitting: Family Medicine

## 2021-02-26 ENCOUNTER — Encounter: Payer: Self-pay | Admitting: Family Medicine

## 2021-02-26 ENCOUNTER — Other Ambulatory Visit: Payer: Self-pay

## 2021-02-26 VITALS — BP 140/90 | HR 96 | Ht 63.0 in | Wt 229.2 lb

## 2021-02-26 DIAGNOSIS — R040 Epistaxis: Secondary | ICD-10-CM | POA: Diagnosis not present

## 2021-02-26 DIAGNOSIS — H029 Unspecified disorder of eyelid: Secondary | ICD-10-CM

## 2021-02-26 DIAGNOSIS — M170 Bilateral primary osteoarthritis of knee: Secondary | ICD-10-CM | POA: Diagnosis not present

## 2021-02-26 DIAGNOSIS — L819 Disorder of pigmentation, unspecified: Secondary | ICD-10-CM

## 2021-02-26 DIAGNOSIS — E1169 Type 2 diabetes mellitus with other specified complication: Secondary | ICD-10-CM | POA: Diagnosis not present

## 2021-02-26 DIAGNOSIS — M51369 Other intervertebral disc degeneration, lumbar region without mention of lumbar back pain or lower extremity pain: Secondary | ICD-10-CM

## 2021-02-26 DIAGNOSIS — M5136 Other intervertebral disc degeneration, lumbar region: Secondary | ICD-10-CM

## 2021-02-26 LAB — POCT GLYCOSYLATED HEMOGLOBIN (HGB A1C): HbA1c, POC (controlled diabetic range): 5.9 % (ref 0.0–7.0)

## 2021-02-26 MED ORDER — MAGNESIUM SALICYLATE 325 MG PO TABS: 2.0000 | ORAL_TABLET | Freq: Three times a day (TID) | ORAL | 0 refills | Status: DC | PRN

## 2021-02-26 NOTE — Patient Instructions (Addendum)
Try the Magnesium Salicylate pain medication for the pain in yur back and knees.  Watch for blood in your stools or black, tarry stools.  Stop this medication if this happens, and let Dr Phylliss Strege know.   Quick Seal Nosebleed Kit or something Over-the-Counter like it.  Afrin nasal spray may also be used to stop nose bleeds.    Nosebleed, Adult A nosebleed is when blood comes out of the nose. Nosebleeds are common and can be caused by many things. They are usually not a sign of a serious medical problem. Follow these instructions at home: When you have a nosebleed:  Sit down.  Tilt your head a little forward.  Follow these steps: 1. Pinch your nose with a clean towel or tissue. 2. Keep pinching your nose for 5 minutes. Do not let go. 3. After 5 minutes, let go of your nose. 4. If there is still bleeding, do these steps again. Keep doing these steps until the bleeding stops.  Do not put tissues or other things in your nose to stop the bleeding.  Avoid lying down or putting your head back.  Use a nose spray decongestant as told by your doctor.   After a nosebleed:  Try not to blow your nose or sniffle for several hours.  Try not to strain, lift, or bend at the waist for several days.  Aspirin and blood-thinning medicines make bleeding more likely. If you take these medicines: ? Ask your doctor if you should stop taking them or if you should change how much you take. ? Do not stop taking the medicine unless your doctor tells you to.  If your nosebleed was caused by dryness, use over-the-counter saline nasal spray or gel and humidifier as told by your doctor. This will keep the inside of your nose moist and allow it to heal. If you need to use one of these products: ? Choose one that is water-soluble. ? Use only as much as you need and use it only as often as needed. ? Do not lie down right away after you use it.  If you get nosebleeds often, talk with your doctor about  treatments. These may include: ? Nasal cautery. A chemical swab or electrical device is used to lightly burn tiny blood vessels inside the nose. This helps stop or prevent nosebleeds. ? Nasal packing. A gauze or other material is placed in the nose to keep constant pressure on the bleeding area. Contact a doctor if:  You have a fever.  You get nosebleeds often.  You are getting nosebleeds more often than usual.  You bruise very easily.  You have something stuck in your nose.  You have bleeding in your mouth.  You vomit or cough up brown material.  You get a nosebleed after you start a new medicine. Get help right away if:  You have a nosebleed after you fall or hurt your head.  Your nosebleed does not go away after 20 minutes.  You feel dizzy or weak.  You have unusual bleeding from other parts of your body.  You have unusual bruising on other parts of your body.  You get sweaty.  You vomit blood. Summary  Nosebleeds are common. They are usually not a sign of a serious medical problem.  When you have a nosebleed, sit down and tilt your head a little forward. Pinch your nose with a clean tissue for 5 minutes.  Use saline spray or saline gel and a humidifier as  told by your doctor.  Get help right away if your nosebleed does not go away after 20 minutes. This information is not intended to replace advice given to you by your health care provider. Make sure you discuss any questions you have with your health care provider. Document Revised: 08/16/2019 Document Reviewed: 08/16/2019 Elsevier Patient Education  2021 [employer].

## 2021-02-27 ENCOUNTER — Encounter: Payer: Self-pay | Admitting: Family Medicine

## 2021-02-27 DIAGNOSIS — H029 Unspecified disorder of eyelid: Secondary | ICD-10-CM | POA: Insufficient documentation

## 2021-02-27 DIAGNOSIS — R040 Epistaxis: Secondary | ICD-10-CM

## 2021-02-27 HISTORY — DX: Unspecified disorder of eyelid: H02.9

## 2021-02-27 HISTORY — DX: Epistaxis: R04.0

## 2021-02-27 NOTE — Progress Notes (Signed)
[name] is alone Sources of clinical information for visit is/are patient and past medical records. Nursing assessment for this office visit was reviewed with the patient for accuracy and revision.     Previous Report(s) Reviewed: office notes  Depression screen [facility] 2/9 02/26/2021  Decreased Interest 0  Down, Depressed, Hopeless 0  PHQ - 2 Score 0  Altered sleeping 0  Tired, decreased energy 0  Change in appetite 0  Feeling bad or failure about yourself  0  Trouble concentrating 0  Moving slowly or fidgety/restless 0  Suicidal thoughts 0  PHQ-9 Score 0  Difficult doing work/chores -  Some recent data might be hidden    Fall Risk  01/31/2020 10/11/2019 04/10/2019 06/14/2018 09/02/2017  Falls in the past year? 0 1 0 No No  Number falls in past yr: 0 0 - - -  Injury with Fall? 0 1 - - -  Risk for fall due to : - - - - -    PHQ9 SCORE ONLY 02/26/2021 10/16/2020 05/01/2020  PHQ-9 Total Score 0 0 1    Adult vaccines due  Topic Date Due  . TETANUS/TDAP  07/25/2025    Health Maintenance Due  Topic Date Due  . FOOT EXAM  04/01/2018      History/P.E. limitations: none  Adult vaccines due  Topic Date Due  . TETANUS/TDAP  07/25/2025    Diabetes Health Maintenance Due  Topic Date Due  . FOOT EXAM  04/01/2018  . OPHTHALMOLOGY EXAM  05/21/2021  . HEMOGLOBIN A1C  08/28/2021    Health Maintenance Due  Topic Date Due  . FOOT EXAM  04/01/2018     Chief Complaint  Patient presents with  . Eyelid Lesion    CPT E&M Office Visit Time  Total Visit Time: 35 minutes  Level 2 Est: 10-19 min     New: 15-29 min Level 3 Est: 20-29 min     New: 30-44 min Level 4 Est: 30-39 min     New: 45-59 min Level 5 Est: 40-54 min     New: 60-74 min   > Level 5 - see prolonged service CPT E&M codes; 99XXX

## 2021-02-27 NOTE — Assessment & Plan Note (Signed)
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 Advised to watch for melena or BRBPR

## 2021-02-27 NOTE — Assessment & Plan Note (Signed)
Lab Results  Component Value Date   HGBA1C 5.9 02/26/2021   Established problem. Adequate glycemic control.  Pt is tolerating the current medication regiment. Continue lifestyle mngt.

## 2021-02-27 NOTE — Assessment & Plan Note (Signed)
Recurrent problem Last episode last week lasted about 45 minutes.  Right anterior nare showedpossible healing superficial wound on turbinate  Discussed bleeding sealant powders available OTC, use of Afrin, and proper technique for stopping nasal bleed.

## 2021-02-27 NOTE — Assessment & Plan Note (Signed)
New concern Lesion located right lower medial eyelid margin May have increased in size.  Started itching. Present for several years  Blue-black rounded nodule Approximately 2 mm diameter located right lower eyelid.  Distinct border from normal surrounding lid margin.   Suspect some type of nevus, but givne pigmented nature and possible enlargment and itching, it does seem prudent to have excision for histopathologic evaluation to rule out melanoma.   Patient has already contacted [facility] who required a referral from PCP for consultation.  Referral fax'd

## 2021-03-23 ENCOUNTER — Other Ambulatory Visit: Payer: Self-pay

## 2021-03-23 ENCOUNTER — Other Ambulatory Visit: Payer: Self-pay | Admitting: Family Medicine

## 2021-03-23 ENCOUNTER — Telehealth (INDEPENDENT_AMBULATORY_CARE_PROVIDER_SITE_OTHER): Payer: Medicare HMO | Admitting: Family Medicine

## 2021-03-23 DIAGNOSIS — D509 Iron deficiency anemia, unspecified: Secondary | ICD-10-CM

## 2021-03-23 DIAGNOSIS — R040 Epistaxis: Secondary | ICD-10-CM | POA: Diagnosis not present

## 2021-03-23 DIAGNOSIS — Z9884 Bariatric surgery status: Secondary | ICD-10-CM

## 2021-03-23 NOTE — Progress Notes (Signed)
[facility] Visit  Patient consented to have virtual visit and was identified by name and date of birth. Method of visit: Telephone  Encounter participants: Patient: [name] - located at home  Provider: Martyn Malay - located at home Others (if applicable): none   Chief Complaint: epistaxis   HPI: [name] is a very pleasant 65 year old woman with history of hypertension, allergic rhinitis and new onset of epistaxis in the last month presenting today for ongoing problems with epistaxis.  The patient reports she has never had epistaxis until about 1 to 2 months ago.  Multiple times per week now she has right-sided epistaxis that last for 10 to 15 minutes.  She reports clots are accompanied by this.  She is using the measures outlined by her primary care physician in April without relief.  She has a remote history of a gastrointestinal bleed but denies other easy bleeding or bruising.  Her last platelets were in 2021 and within normal limits.  She denies dizziness chest pain or palpitations.  She reports nosebleed stopped after 10 to 15 minutes but they are quite bothersome to her.  She denies digital trauma.  She does not use any intranasal corticosteroids.  She is using Afrin 1-2 times per week at most and over-the-counter sealants for this at this time.  ROS: per HPI  Pertinent PMHx:  Hypertension   Exam:  Speaking in full sentences  Appropriate   Assessment/Plan:  Anterior epistaxis Given ongoing symptoms, unilaterality and the fact that this is new discussed options.  Recommended petroleum jelly to anterior nares night.  Recommended humidifier in room.  Discussed avoiding triggers.  Referral to ear nose and throat for further evaluation.  Reviewed Retarpen precautions and reasons to call.    Time spent during visit with patient: 8 minutes

## 2021-03-23 NOTE — Assessment & Plan Note (Signed)
Given ongoing symptoms, unilaterality and the fact that this is new discussed options.  Recommended petroleum jelly to anterior nares night.  Recommended humidifier in room.  Discussed avoiding triggers.  Referral to ear nose and throat for further evaluation.  Reviewed Retarpen precautions and reasons to call.

## 2021-04-02 ENCOUNTER — Ambulatory Visit (INDEPENDENT_AMBULATORY_CARE_PROVIDER_SITE_OTHER): Payer: Medicare HMO

## 2021-04-02 ENCOUNTER — Other Ambulatory Visit: Payer: Self-pay

## 2021-04-02 ENCOUNTER — Ambulatory Visit (INDEPENDENT_AMBULATORY_CARE_PROVIDER_SITE_OTHER): Payer: Medicare HMO | Admitting: Family Medicine

## 2021-04-02 ENCOUNTER — Encounter: Payer: Self-pay | Admitting: Family Medicine

## 2021-04-02 VITALS — BP 128/78 | Ht 63.0 in | Wt 228.0 lb

## 2021-04-02 DIAGNOSIS — Z23 Encounter for immunization: Secondary | ICD-10-CM | POA: Diagnosis not present

## 2021-04-02 DIAGNOSIS — G4700 Insomnia, unspecified: Secondary | ICD-10-CM | POA: Diagnosis not present

## 2021-04-02 DIAGNOSIS — M19049 Primary osteoarthritis, unspecified hand: Secondary | ICD-10-CM | POA: Insufficient documentation

## 2021-04-02 DIAGNOSIS — R040 Epistaxis: Secondary | ICD-10-CM

## 2021-04-02 HISTORY — DX: Primary osteoarthritis, unspecified hand: M19.049

## 2021-04-02 MED ORDER — TRAMADOL HCL 50 MG PO TABS: 50.0000 mg | ORAL_TABLET | Freq: Three times a day (TID) | ORAL | 0 refills | Status: AC | PRN

## 2021-04-02 MED ORDER — ZOLPIDEM TARTRATE 5 MG PO TABS: ORAL_TABLET | ORAL | 0 refills | Status: DC

## 2021-04-02 NOTE — Progress Notes (Signed)
[name] is alone Sources of clinical information for visit is/are patient and past medical records. Nursing assessment for this office visit was reviewed with the patient for accuracy and revision.     Previous Report(s) Reviewed: lab reports and office notes  Depression screen [facility] 2/9 04/02/2021  Decreased Interest 0  Down, Depressed, Hopeless 0  PHQ - 2 Score 0  Altered sleeping 2  Tired, decreased energy 2  Change in appetite 0  Feeling bad or failure about yourself  0  Trouble concentrating 0  Moving slowly or fidgety/restless 0  Suicidal thoughts 0  PHQ-9 Score 4  Difficult doing work/chores Not difficult at all  Some recent data might be hidden    Fall Risk  04/02/2021 01/31/2020 10/11/2019 04/10/2019 06/14/2018  Falls in the past year? 0 0 1 0 No  Number falls in past yr: 0 0 0 - -  Injury with Fall? - 0 1 - -  Risk for fall due to : - - - - -    PHQ9 SCORE ONLY 04/02/2021 02/26/2021 10/16/2020  PHQ-9 Total Score 4 0 0    Adult vaccines due  Topic Date Due  . TETANUS/TDAP  07/25/2025    Health Maintenance Due  Topic Date Due  . Zoster Vaccines- Shingrix (1 of 2) Never done  . FOOT EXAM  04/01/2018  . DEXA SCAN  Never done  . PNA vac Low Risk Adult (1 of 2 - PCV13) 03/07/2021      History/P.E. limitations: none  Adult vaccines due  Topic Date Due  . TETANUS/TDAP  07/25/2025    Diabetes Health Maintenance Due  Topic Date Due  . FOOT EXAM  04/01/2018  . OPHTHALMOLOGY EXAM  05/21/2021  . HEMOGLOBIN A1C  08/28/2021    Health Maintenance Due  Topic Date Due  . Zoster Vaccines- Shingrix (1 of 2) Never done  . FOOT EXAM  04/01/2018  . DEXA SCAN  Never done  . PNA vac Low Risk Adult (1 of 2 - PCV13) 03/07/2021     Chief Complaint  Patient presents with  . Epistaxis

## 2021-04-02 NOTE — Patient Instructions (Signed)
Try soaking a cotton ball in Afrin and put in nostril that is bleeding.  Leave in for 10 minutes then remove.   May repeat as needed.     Nosebleed, Adult A nosebleed is when blood comes out of the nose. Nosebleeds are common and can be caused by many things. They are usually not a sign of a serious medical problem. Follow these instructions at home: When you have a nosebleed:  Sit down.  Tilt your head a little forward.  Follow these steps: 1. Pinch your nose with a clean towel or tissue. 2. Keep pinching your nose for 5 minutes. Do not let go. 3. After 5 minutes, let go of your nose. 4. If there is still bleeding, do these steps again. Keep doing these steps until the bleeding stops.  Do not put tissues or other things in your nose to stop the bleeding.  Avoid lying down or putting your head back.  Use a nose spray decongestant as told by your doctor.   After a nosebleed:  Try not to blow your nose or sniffle for several hours.  Try not to strain, lift, or bend at the waist for several days.  Aspirin and blood-thinning medicines make bleeding more likely. If you take these medicines: ? Ask your doctor if you should stop taking them or if you should change how much you take. ? Do not stop taking the medicine unless your doctor tells you to.  If your nosebleed was caused by dryness, use over-the-counter saline nasal spray or gel and humidifier as told by your doctor. This will keep the inside of your nose moist and allow it to heal. If you need to use one of these products: ? Choose one that is water-soluble. ? Use only as much as you need and use it only as often as needed. ? Do not lie down right away after you use it.  If you get nosebleeds often, talk with your doctor about treatments. These may include: ? Nasal cautery. A chemical swab or electrical device is used to lightly burn tiny blood vessels inside the nose. This helps stop or prevent nosebleeds. ? Nasal  packing. A gauze or other material is placed in the nose to keep constant pressure on the bleeding area. Contact a doctor if:  You have a fever.  You get nosebleeds often.  You are getting nosebleeds more often than usual.  You bruise very easily.  You have something stuck in your nose.  You have bleeding in your mouth.  You vomit or cough up brown material.  You get a nosebleed after you start a new medicine. Get help right away if:  You have a nosebleed after you fall or hurt your head.  Your nosebleed does not go away after 20 minutes.  You feel dizzy or weak.  You have unusual bleeding from other parts of your body.  You have unusual bruising on other parts of your body.  You get sweaty.  You vomit blood. Summary  Nosebleeds are common. They are usually not a sign of a serious medical problem.  When you have a nosebleed, sit down and tilt your head a little forward. Pinch your nose with a clean tissue for 5 minutes.  Use saline spray or saline gel and a humidifier as told by your doctor.  Get help right away if your nosebleed does not go away after 20 minutes. This information is not intended to replace advice given to you by your  health care provider. Make sure you discuss any questions you have with your health care provider. Document Revised: 08/16/2019 Document Reviewed: 08/16/2019 Elsevier Patient Education  2021 [employer].

## 2021-04-02 NOTE — Assessment & Plan Note (Signed)
Active bleed during visit from right nare Exam showed 1 mm raised tissue on anterior septum right side.  Tissue with blood oozing.   Tissue treated with silver nitrate stick with stop of bleeding.   Discussed referral to ENT for the recurrent epistaxis Advised to used cotton soaked in Afrin should bleeding restart.

## 2021-04-02 NOTE — Assessment & Plan Note (Signed)
Established problem worsened.  APAP, Voltaren gel insufficient analgesia Unable to locate Mg Salicylate to try trial  Rx Tramadol 50 mg tablet, 1 tablet tid prn hand pain unrelieved by APAP  Disp #20 RF zero

## 2021-04-07 DIAGNOSIS — R040 Epistaxis: Secondary | ICD-10-CM | POA: Diagnosis not present

## 2021-04-07 DIAGNOSIS — J343 Hypertrophy of nasal turbinates: Secondary | ICD-10-CM | POA: Diagnosis not present

## 2021-05-11 ENCOUNTER — Ambulatory Visit (INDEPENDENT_AMBULATORY_CARE_PROVIDER_SITE_OTHER): Payer: Medicare HMO | Admitting: Otolaryngology

## 2021-05-21 ENCOUNTER — Other Ambulatory Visit: Payer: Self-pay | Admitting: Family Medicine

## 2021-05-21 ENCOUNTER — Ambulatory Visit (INDEPENDENT_AMBULATORY_CARE_PROVIDER_SITE_OTHER): Payer: Medicare HMO | Admitting: Family Medicine

## 2021-05-21 ENCOUNTER — Other Ambulatory Visit: Payer: Self-pay

## 2021-05-21 ENCOUNTER — Encounter: Payer: Self-pay | Admitting: Family Medicine

## 2021-05-21 VITALS — BP 131/82 | HR 87 | Ht 63.0 in | Wt 238.0 lb

## 2021-05-21 DIAGNOSIS — I872 Venous insufficiency (chronic) (peripheral): Secondary | ICD-10-CM | POA: Diagnosis not present

## 2021-05-21 DIAGNOSIS — E2839 Other primary ovarian failure: Secondary | ICD-10-CM | POA: Diagnosis not present

## 2021-05-21 DIAGNOSIS — E1140 Type 2 diabetes mellitus with diabetic neuropathy, unspecified: Secondary | ICD-10-CM | POA: Diagnosis not present

## 2021-05-21 DIAGNOSIS — E860 Dehydration: Secondary | ICD-10-CM

## 2021-05-21 DIAGNOSIS — Z6841 Body Mass Index (BMI) 40.0 and over, adult: Secondary | ICD-10-CM

## 2021-05-21 DIAGNOSIS — G4700 Insomnia, unspecified: Secondary | ICD-10-CM

## 2021-05-21 DIAGNOSIS — R600 Localized edema: Secondary | ICD-10-CM

## 2021-05-21 DIAGNOSIS — M170 Bilateral primary osteoarthritis of knee: Secondary | ICD-10-CM

## 2021-05-21 DIAGNOSIS — R0609 Other forms of dyspnea: Secondary | ICD-10-CM | POA: Diagnosis not present

## 2021-05-21 DIAGNOSIS — M21611 Bunion of right foot: Secondary | ICD-10-CM

## 2021-05-21 DIAGNOSIS — E1169 Type 2 diabetes mellitus with other specified complication: Secondary | ICD-10-CM

## 2021-05-21 DIAGNOSIS — E1159 Type 2 diabetes mellitus with other circulatory complications: Secondary | ICD-10-CM

## 2021-05-21 DIAGNOSIS — E785 Hyperlipidemia, unspecified: Secondary | ICD-10-CM

## 2021-05-21 DIAGNOSIS — I152 Hypertension secondary to endocrine disorders: Secondary | ICD-10-CM

## 2021-05-21 DIAGNOSIS — M21612 Bunion of left foot: Secondary | ICD-10-CM

## 2021-05-21 DIAGNOSIS — E870 Hyperosmolality and hypernatremia: Secondary | ICD-10-CM

## 2021-05-21 LAB — POCT GLYCOSYLATED HEMOGLOBIN (HGB A1C): HbA1c, POC (controlled diabetic range): 5.6 % (ref 0.0–7.0)

## 2021-05-21 MED ORDER — SEMAGLUTIDE(0.25 OR 0.5MG/DOS) 2 MG/1.5ML ~~LOC~~ SOPN: PEN_INJECTOR | SUBCUTANEOUS | 0 refills | Status: DC

## 2021-05-21 NOTE — Patient Instructions (Addendum)
We are checking labs to make sure the swelling is not coming from your heart, kidneys, or liver.   Start the Ozempic injections - 0.25 mg every week for 4 weeks, then increase to 0.5 mg a week for one time, then increase to 1 mg a week until Dr Lyzette Reinhardt sees you.    Make sure you ask the pharmacists to show you how to use the injection pen.    Low-Sodium Eating Plan Sodium, which is an element that makes up salt, helps you maintain a healthy balance of fluids in your body. Too much sodium can increase your bloodpressure and cause fluid and waste to be held in your body. Your health care provider or dietitian may recommend following this plan if you have high blood pressure (hypertension), kidney disease, liver disease, or heart failure. Eating less sodium can help lower your blood pressure, reduce swelling, and protect your heart, liver, andkidneys. What are tips for following this plan? Reading food labels The Nutrition Facts label lists the amount of sodium in one serving of the food. If you eat more than one serving, you must multiply the listed amount of sodium by the number of servings. Choose foods with less than 140 mg of sodium per serving. Avoid foods with 300 mg of sodium or more per serving. Shopping  Look for lower-sodium products, often labeled as "low-sodium" or "no salt added." Always check the sodium content, even if foods are labeled as "unsalted" or "no salt added." Buy fresh foods. Avoid canned foods and pre-made or frozen meals. Avoid canned, cured, or processed meats. Buy breads that have less than 80 mg of sodium per slice.  Cooking  Eat more home-cooked food and less restaurant, buffet, and fast food. Avoid adding salt when cooking. Use salt-free seasonings or herbs instead of table salt or sea salt. Check with your health care provider or pharmacist before using salt substitutes. Cook with plant-based oils, such as canola, sunflower, or olive oil.  Meal  planning When eating at a restaurant, ask that your food be prepared with less salt or no salt, if possible. Avoid dishes labeled as brined, pickled, cured, smoked, or made with soy sauce, miso, or teriyaki sauce. Avoid foods that contain MSG (monosodium glutamate). MSG is sometimes added to [place] food, bouillon, and some canned foods. Make meals that can be grilled, baked, poached, roasted, or steamed. These are generally made with less sodium. General information Most people on this plan should limit their sodium intake to 1,500-2,000 mg (milligrams) of sodium each day. What foods should I eat? Fruits Fresh, frozen, or canned fruit. Fruit juice. Vegetables Fresh or frozen vegetables. "No salt added" canned vegetables. "No salt added"tomato sauce and paste. Low-sodium or reduced-sodium tomato and vegetable juice. Grains Low-sodium cereals, including oats, puffed wheat and rice, and shredded wheat. Low-sodium crackers. Unsalted rice. Unsalted pasta. Low-sodium bread.Whole-grain breads and whole-grain pasta. Meats and other proteins Fresh or frozen (no salt added) meat, poultry, seafood, and fish. Low-sodium canned tuna and salmon. Unsalted nuts. Dried peas, beans, and lentils withoutadded salt. Unsalted canned beans. Eggs. Unsalted nut butters. Dairy Milk. Soy milk. Cheese that is naturally low in sodium, such as ricotta cheese, fresh mozzarella, or Swiss cheese. Low-sodium or reduced-sodium cheese. Creamcheese. Yogurt. Seasonings and condiments Fresh and dried herbs and spices. Salt-free seasonings. Low-sodium mustard and ketchup. Sodium-free salad dressing. Sodium-free light mayonnaise. Fresh orrefrigerated horseradish. Lemon juice. Vinegar. Other foods Homemade, reduced-sodium, or low-sodium soups. Unsalted popcorn and pretzels.Low-salt or salt-free chips. The items  listed above may not be a complete list of foods and beverages you can eat. Contact a dietitian for more information. What  foods should I avoid? Vegetables Sauerkraut, pickled vegetables, and relishes. Olives. [place] fries. Onion rings. Regular canned vegetables (not low-sodium or reduced-sodium). Regular canned tomato sauce and paste (not low-sodium or reduced-sodium). Regular tomato and vegetable juice (not low-sodium or reduced-sodium). Frozenvegetables in sauces. Grains Instant hot cereals. Bread stuffing, pancake, and biscuit mixes. Croutons. Seasoned rice or pasta mixes. Noodle soup cups. Boxed or frozen macaroni andcheese. Regular salted crackers. Self-rising flour. Meats and other proteins Meat or fish that is salted, canned, smoked, spiced, or pickled. Precooked or cured meat, such as sausages or meat loaves. [place] Salines. Ham. Pepperoni. Hot dogs. Corned beef. Chipped beef. Salt pork. Jerky. Pickled herring. Anchovies andsardines. Regular canned tuna. Salted nuts. Dairy Processed cheese and cheese spreads. Hard cheeses. Cheese curds. Blue cheese.Feta cheese. String cheese. Regular cottage cheese. Buttermilk. Canned milk. Fats and oils Salted butter. Regular margarine. Ghee. Bacon fat. Seasonings and condiments Onion salt, garlic salt, seasoned salt, table salt, and sea salt. Canned and packaged gravies. Worcestershire sauce. Tartar sauce. Barbecue sauce. Teriyaki sauce. Soy sauce, including reduced-sodium. Steak sauce. Fish sauce. Oyster sauce. Cocktail sauce. Horseradish that you find on the shelf. Regular ketchup and mustard. Meat flavorings and tenderizers. Bouillon cubes. Hot sauce. Pre-made or packaged marinades. Pre-made or packaged taco seasonings. Relishes.Regular salad dressings. Salsa. Other foods Salted popcorn and pretzels. Corn chips and puffs. Potato and tortilla chips.Canned or dried soups. Pizza. Frozen entrees and pot pies. The items listed above may not be a complete list of foods and beverages you should avoid. Contact a dietitian for more information. Summary Eating less sodium can help lower your  blood pressure, reduce swelling, and protect your heart, liver, and kidneys. Most people on this plan should limit their sodium intake to 1,500-2,000 mg (milligrams) of sodium each day. Canned, boxed, and frozen foods are high in sodium. Restaurant foods, fast foods, and pizza are also very high in sodium. You also get sodium by adding salt to food. Try to cook at home, eat more fresh fruits and vegetables, and eat less fast food and canned, processed, or prepared foods. This information is not intended to replace advice given to you by your health care provider. Make sure you discuss any questions you have with your healthcare provider. Document Revised: 11/23/2019 Document Reviewed: 09/19/2019 Elsevier Patient Education  2022 [employer].

## 2021-05-22 ENCOUNTER — Encounter: Payer: Self-pay | Admitting: Family Medicine

## 2021-05-22 DIAGNOSIS — Z6841 Body Mass Index (BMI) 40.0 and over, adult: Secondary | ICD-10-CM

## 2021-05-22 DIAGNOSIS — E860 Dehydration: Secondary | ICD-10-CM | POA: Insufficient documentation

## 2021-05-22 DIAGNOSIS — Z6835 Body mass index (BMI) 35.0-35.9, adult: Secondary | ICD-10-CM | POA: Insufficient documentation

## 2021-05-22 DIAGNOSIS — E870 Hyperosmolality and hypernatremia: Secondary | ICD-10-CM | POA: Insufficient documentation

## 2021-05-22 HISTORY — DX: Body Mass Index (BMI) 40.0 and over, adult: Z684

## 2021-05-22 LAB — CMP14+EGFR
ALT: 20 IU/L (ref 0–32)
AST: 28 IU/L (ref 0–40)
Albumin/Globulin Ratio: 1.6 (ref 1.2–2.2)
Albumin: 4.1 g/dL (ref 3.8–4.8)
Alkaline Phosphatase: 88 IU/L (ref 44–121)
BUN/Creatinine Ratio: 27 (ref 12–28)
BUN: 21 mg/dL (ref 8–27)
Bilirubin Total: 0.2 mg/dL (ref 0.0–1.2)
CO2: 22 mmol/L (ref 20–29)
Calcium: 8.9 mg/dL (ref 8.7–10.3)
Chloride: 108 mmol/L — ABNORMAL HIGH (ref 96–106)
Creatinine, Ser: 0.78 mg/dL (ref 0.57–1.00)
Globulin, Total: 2.5 g/dL (ref 1.5–4.5)
Glucose: 81 mg/dL (ref 65–99)
Potassium: 3.7 mmol/L (ref 3.5–5.2)
Sodium: 148 mmol/L — ABNORMAL HIGH (ref 134–144)
Total Protein: 6.6 g/dL (ref 6.0–8.5)
eGFR: 84 mL/min/{1.73_m2} (ref >59)

## 2021-05-22 LAB — CBC
Hematocrit: 35.1 % (ref 34.0–46.6)
Hemoglobin: 11.9 g/dL (ref 11.1–15.9)
MCH: 30.6 pg (ref 26.6–33.0)
MCHC: 33.9 g/dL (ref 31.5–35.7)
MCV: 90 fL (ref 79–97)
Platelets: 241 10*3/uL (ref 150–450)
RBC: 3.89 x10E6/uL (ref 3.77–5.28)
RDW: 13.1 % (ref 11.7–15.4)
WBC: 6.4 10*3/uL (ref 3.4–10.8)

## 2021-05-22 LAB — BRAIN NATRIURETIC PEPTIDE: BNP: 22.8 pg/mL (ref 0.0–100.0)

## 2021-05-22 LAB — TSH: TSH: 0.737 u[IU]/mL (ref 0.450–4.500)

## 2021-05-22 NOTE — Assessment & Plan Note (Signed)
Established problem Uncontrolled Trial Ozempic RTC 6 weeks

## 2021-05-22 NOTE — Assessment & Plan Note (Signed)
New problem Serum Sodium 148 with elevated Chloride Due to Lasix and HCTZ therapy Encouraged [name] to stop Lasix as it does not seem to reduce her chronic leg edema. If unable to stop completely, recommended reducing to no more than twice a week Lasix 40 mg by mouth. RTC 6 weeks for recheck BMET

## 2021-05-22 NOTE — Assessment & Plan Note (Signed)
Established problem Well Controlled. No signs of complications, medication side effects, or red flags. Continue current medications and other regiments.  

## 2021-05-22 NOTE — Assessment & Plan Note (Signed)
Lab Results  Component Value Date   CHOL 143 06/14/2018   CHOL 151 05/20/2017   CHOL 122 12/08/2016   Lab Results  Component Value Date   HDL 89 06/14/2018   HDL 94 05/20/2017   HDL 80 12/08/2016   Lab Results  Component Value Date   LDLCALC 45 06/14/2018   LDLCALC 48 05/20/2017   LDLCALC 33 12/08/2016   Lab Results  Component Value Date   TRIG 47 06/14/2018   TRIG 46 05/20/2017   TRIG 45 12/08/2016   Lab Results  Component Value Date   CHOLHDL 1.6 06/14/2018   CHOLHDL 1.6 05/20/2017   CHOLHDL 1.5 12/08/2016   Lab Results  Component Value Date   LDLDIRECT 37 08/19/2014   LDLDIRECT 66 08/29/2012   LDLDIRECT 91 01/26/2012  Established problem Well Controlled. No signs of complications, medication side effects, or red flags. Continue current medications and other regiments.  Monitoring Lipid panel at next office visit in 6 weeks.

## 2021-05-22 NOTE — Assessment & Plan Note (Signed)
Established problem Uncontrolled [name] would like to use Ozempic for her diabetes and see if she can experience weight loss.  Dr Erasmo Downer D. Gave instruction in Ozempic pen use  Rx Ozempic 0.25 mg weekly x 4 weeks, then 0.5 mg weekly for one week, then 1 mg weekly RTC 6 weeks to assess tolerance and efficancy

## 2021-05-22 NOTE — Assessment & Plan Note (Signed)
Established problem Well Controlled. No signs of complications, medication side effects, or red flags. Continue current medications and other regiments. Avoiding NSAIDs Rx tramadol 20 tablets last visit with no RF

## 2021-05-22 NOTE — Assessment & Plan Note (Addendum)
Established problem subjectively worsened.  Wt Readings from Last 3 Encounters:  05/21/21 238 lb (108 kg)  04/02/21 228 lb (103.4 kg)  02/26/21 229 lb 4 oz (104 kg)   Patient's weight is increased 10 pounds over last month. No evidence of Left heart failure by history nor physical exam Edema 2(+) pitting up to knees bilaterally, with sparing of feet dorsum No skin breaks, no weeping skin, no skin eythema, no venous stasis changes  Leg Edema - Ddx:  Venous insufficiency, Leg dependency, Lymphedema, Lipedema,  Doubt DVT (no pain, no inflammatory changes of legs, bilateral presentation) - Treatment recommendations: support stockings, elevation of involved area, low-salt diet, weight loss  LABS: Normal CMEexcept hypernatremia, normal CBC, normal TSH;   Normal BNP

## 2021-05-22 NOTE — Assessment & Plan Note (Signed)
Established problem Rx Ozempic RTC 6 weeks

## 2021-05-22 NOTE — Progress Notes (Signed)
[name] is alone Sources of clinical information for visit is/are patient. Nursing assessment for this office visit was reviewed with the patient for accuracy and revision.     Previous Report(s) Reviewed: none  Depression screen PHQ 2/9 05/21/2021  Decreased Interest -  Down, Depressed, Hopeless 0  PHQ - 2 Score 0  Altered sleeping 2  Tired, decreased energy 2  Change in appetite 0  Feeling bad or failure about yourself  0  Trouble concentrating 0  Moving slowly or fidgety/restless 0  Suicidal thoughts 0  PHQ-9 Score 4  Difficult doing work/chores Not difficult at all  Some recent data might be hidden    Fall Risk  05/21/2021 04/02/2021 01/31/2020 10/11/2019 04/10/2019  Falls in the past year? 1 0 0 1 0  Number falls in past yr: 1 0 0 0 -  Injury with Fall? 1 - 0 1 -  Risk for fall due to : - - - - -    PHQ9 SCORE ONLY 05/21/2021 04/02/2021 02/26/2021  PHQ-9 Total Score 4 4 0    Adult vaccines due  Topic Date Due   TETANUS/TDAP  07/25/2025    Health Maintenance Due  Topic Date Due   Zoster Vaccines- Shingrix (1 of 2) Never done   FOOT EXAM  04/01/2018   DEXA SCAN  Never done   PNA vac Low Risk Adult (1 of 2 - PCV13) 03/07/2021   OPHTHALMOLOGY EXAM  05/21/2021      History/P.E. limitations: none  Adult vaccines due  Topic Date Due   TETANUS/TDAP  07/25/2025    Diabetes Health Maintenance Due  Topic Date Due   FOOT EXAM  04/01/2018   OPHTHALMOLOGY EXAM  05/21/2021   HEMOGLOBIN A1C  11/21/2021    Health Maintenance Due  Topic Date Due   Zoster Vaccines- Shingrix (1 of 2) Never done   FOOT EXAM  04/01/2018   DEXA SCAN  Never done   PNA vac Low Risk Adult (1 of 2 - PCV13) 03/07/2021   OPHTHALMOLOGY EXAM  05/21/2021     Chief Complaint  Patient presents with   Foot Swelling    Pt stated this has been going on for a couple of weeks   Joint Swelling    Diabetic Foot Exam - Simple   Simple Foot Form Diabetic Foot exam was performed with the following  findings: Yes 05/21/2021 11:11 AM  Visual Inspection See comments: Yes Sensation Testing See comments: Yes Pulse Check Posterior Tibialis and Dorsalis pulse intact bilaterally: Yes Comments Bunions bilaterally Onychauxis Decreased 3/5 monofilament touch bilaterally   Visit Problem List with A/P  Morbid obesity ([place]) Established problem Uncontrolled [name] would like to use Ozempic for her diabetes and see if she can experience weight loss.  Dr Laureen Abrahams instruction in Ozempic pen use  Rx Ozempic 0.25 mg weekly x 4 weeks, then 0.5 mg weekly for one week, then 1 mg weekly RTC 6 weeks to assess tolerance and efficancy   Hyperlipidemia associated with type 2 diabetes mellitus ([place]) Lab Results  Component Value Date   CHOL 143 06/14/2018   CHOL 151 05/20/2017   CHOL 122 12/08/2016   Lab Results  Component Value Date   HDL 89 06/14/2018   HDL 94 05/20/2017   HDL 80 12/08/2016   Lab Results  Component Value Date   LDLCALC 45 06/14/2018   LDLCALC 48 05/20/2017   LDLCALC 33 12/08/2016   Lab Results  Component Value Date  TRIG 47 06/14/2018   TRIG 46 05/20/2017   TRIG 45 12/08/2016   Lab Results  Component Value Date   CHOLHDL 1.6 06/14/2018   CHOLHDL 1.6 05/20/2017   CHOLHDL 1.5 12/08/2016   Lab Results  Component Value Date   LDLDIRECT 37 08/19/2014   LDLDIRECT 66 08/29/2012   LDLDIRECT 91 01/26/2012  Established problem Well Controlled. No signs of complications, medication side effects, or red flags. Continue current medications and other regiments.  Monitoring Lipid panel at next office visit in 6 weeks.   BMI 40.0-44.9, adult ([place]) Established problem Uncontrolled Trial Ozempic RTC 6 weeks   Hypertension associated with diabetes ([place]) Established problem Well Controlled. No signs of complications, medication side effects, or red flags. Continue current medications and other regiments.   Dehydration with hypernatremia New  problem Serum Sodium 148 with elevated Chloride Due to Lasix and HCTZ therapy Encouraged [name] to stop Lasix as it does not seem to reduce her chronic leg edema. If unable to stop completely, recommended reducing to no more than twice a week Lasix 40 mg by mouth. RTC 6 weeks for recheck BMET  Osteoarthritis of both knees Established problem Well Controlled. No signs of complications, medication side effects, or red flags. Continue current medications and other regiments. Avoiding NSAIDs Rx tramadol 20 tablets last visit with no RF  Venous (peripheral) insufficiency Established problem subjectively worsened.  Wt Readings from Last 3 Encounters:  05/21/21 238 lb (108 kg)  04/02/21 228 lb (103.4 kg)  02/26/21 229 lb 4 oz (104 kg)   Patient's weight is increased 10 pounds over last month. No evidence of Left heart failure by history nor physical exam Edema 2(+) pitting up to knees bilaterally, with sparing of feet dorsum No skin breaks, no weeping skin, no skin eythema, no venous stasis changes  Leg Edema - Ddx:  Venous insufficiency, Leg dependency, constrictive garments, Obstructive sleep apnea (resulting in pulmonary hypertension) Right Heart Failure, Left Heart Failure, Lymphedema, Lipedema,  Doubt DVT (no pain, no inflammatory changes of legs, bilateral presentation) - Treatment recommendations: support stockings, elevation of involved area, low-salt diet, weight loss  LABS: Normal CMEexcept hypernatremia, normal CBC, normal TSH;   Await BNP   Type 2 diabetes mellitus with other specified complication, without long-term current use of insulin ([facility]) Established problem Rx Ozempic RTC 6 weeks

## 2021-06-02 ENCOUNTER — Other Ambulatory Visit: Payer: Self-pay

## 2021-06-02 ENCOUNTER — Ambulatory Visit (INDEPENDENT_AMBULATORY_CARE_PROVIDER_SITE_OTHER): Payer: Medicare HMO | Admitting: Podiatry

## 2021-06-02 DIAGNOSIS — E1169 Type 2 diabetes mellitus with other specified complication: Secondary | ICD-10-CM

## 2021-06-02 DIAGNOSIS — L84 Corns and callosities: Secondary | ICD-10-CM | POA: Diagnosis not present

## 2021-06-02 DIAGNOSIS — E1151 Type 2 diabetes mellitus with diabetic peripheral angiopathy without gangrene: Secondary | ICD-10-CM

## 2021-06-02 DIAGNOSIS — M2042 Other hammer toe(s) (acquired), left foot: Secondary | ICD-10-CM

## 2021-06-02 DIAGNOSIS — M2011 Hallux valgus (acquired), right foot: Secondary | ICD-10-CM

## 2021-06-02 DIAGNOSIS — M2041 Other hammer toe(s) (acquired), right foot: Secondary | ICD-10-CM | POA: Diagnosis not present

## 2021-06-02 DIAGNOSIS — B351 Tinea unguium: Secondary | ICD-10-CM | POA: Diagnosis not present

## 2021-06-02 DIAGNOSIS — M2012 Hallux valgus (acquired), left foot: Secondary | ICD-10-CM | POA: Diagnosis not present

## 2021-06-02 NOTE — Progress Notes (Signed)
  Subjective:  Patient ID: [name], female    DOB: Feb 28, 1956,  MRN: [id number]  Chief Complaint  Patient presents with   Nail Problem    RFC Nail trim bilateral nails.   Callouses    Bilateral bottom foot callus shaving requested.    Foot Orthotics    Pt requesting diabetic shoes.    65 y.o. female presents with the above complaint. History confirmed with patient.   Objective:  Physical Exam: warm, good capillary refill, nail exam onychomycosis of the toenails, no trophic changes or ulcerative lesions. DP pulses palpable, PT pulses non-palpable, and protective sensation intact Left Foot: HAV, hammertoes bilat. HPK subet 2 Right Foot: HAV, hammertoes bilat HPK medial 1st  No images are attached to the encounter.  Assessment:   1. Onychomycosis of multiple toenails with type 2 diabetes mellitus and peripheral angiopathy ([place])   2. Acquired hallux valgus of both feet   3. Hammer toes of both feet    Plan:  Patient was evaluated and treated and all questions answered.  Bunion, Hammertoe, Onychomycosis, Diabetes, and PAD -Patient is diabetic with a qualifying condition for at risk foot care. -Would benefit from DM shoes. Casted for them today.  Procedure: Nail Debridement Type of Debridement: manual, sharp debridement. Instrumentation: Nail nipper, rotary burr. Number of Nails: 10  Procedure: Paring of Lesion Rationale: painful hyperkeratotic lesion Type of Debridement: manual, sharp debridement. Instrumentation: 312 blade Number of Lesions: 2 No follow-ups on file.

## 2021-06-08 ENCOUNTER — Encounter: Payer: Self-pay | Admitting: Family Medicine

## 2021-06-10 ENCOUNTER — Telehealth: Payer: Self-pay

## 2021-06-10 ENCOUNTER — Other Ambulatory Visit: Payer: Self-pay

## 2021-06-10 ENCOUNTER — Emergency Department (HOSPITAL_COMMUNITY): Payer: Medicare HMO

## 2021-06-10 ENCOUNTER — Ambulatory Visit (HOSPITAL_COMMUNITY)
Admission: EM | Admit: 2021-06-10 | Discharge: 2021-06-10 | Disposition: A | Discharge status: Nursing Home (Includes ICF) | Payer: Medicare HMO

## 2021-06-10 ENCOUNTER — Emergency Department (HOSPITAL_COMMUNITY)
Admission: EM | Admit: 2021-06-10 | Discharge: 2021-06-11 | Disposition: A | Discharge status: Home / Self Care | Payer: Medicare HMO | Attending: Emergency Medicine | Admitting: Emergency Medicine

## 2021-06-10 ENCOUNTER — Encounter (HOSPITAL_COMMUNITY): Payer: Self-pay

## 2021-06-10 DIAGNOSIS — R519 Headache, unspecified: Secondary | ICD-10-CM | POA: Diagnosis not present

## 2021-06-10 DIAGNOSIS — Z79899 Other long term (current) drug therapy: Secondary | ICD-10-CM | POA: Insufficient documentation

## 2021-06-10 DIAGNOSIS — E119 Type 2 diabetes mellitus without complications: Secondary | ICD-10-CM | POA: Insufficient documentation

## 2021-06-10 DIAGNOSIS — J45909 Unspecified asthma, uncomplicated: Secondary | ICD-10-CM | POA: Diagnosis not present

## 2021-06-10 DIAGNOSIS — Z96653 Presence of artificial knee joint, bilateral: Secondary | ICD-10-CM | POA: Diagnosis not present

## 2021-06-10 DIAGNOSIS — Z87891 Personal history of nicotine dependence: Secondary | ICD-10-CM | POA: Diagnosis not present

## 2021-06-10 DIAGNOSIS — E876 Hypokalemia: Secondary | ICD-10-CM | POA: Diagnosis not present

## 2021-06-10 DIAGNOSIS — I10 Essential (primary) hypertension: Secondary | ICD-10-CM | POA: Insufficient documentation

## 2021-06-10 DIAGNOSIS — R079 Chest pain, unspecified: Secondary | ICD-10-CM | POA: Diagnosis not present

## 2021-06-10 DIAGNOSIS — Z7982 Long term (current) use of aspirin: Secondary | ICD-10-CM | POA: Insufficient documentation

## 2021-06-10 LAB — CBC WITH DIFFERENTIAL/PLATELET
Abs Immature Granulocytes: 0.01 10*3/uL (ref 0.00–0.07)
Basophils Absolute: 0 10*3/uL (ref 0.0–0.1)
Basophils Relative: 1 %
Eosinophils Absolute: 0.2 10*3/uL (ref 0.0–0.5)
Eosinophils Relative: 4 %
HCT: 39.9 % (ref 36.0–46.0)
Hemoglobin: 12.5 g/dL (ref 12.0–15.0)
Immature Granulocytes: 0 %
Lymphocytes Relative: 29 %
Lymphs Abs: 1.7 10*3/uL (ref 0.7–4.0)
MCH: 30 pg (ref 26.0–34.0)
MCHC: 31.3 g/dL (ref 30.0–36.0)
MCV: 95.9 fL (ref 80.0–100.0)
Monocytes Absolute: 0.6 10*3/uL (ref 0.1–1.0)
Monocytes Relative: 10 %
Neutro Abs: 3.3 10*3/uL (ref 1.7–7.7)
Neutrophils Relative %: 56 %
Platelets: 255 10*3/uL (ref 150–400)
RBC: 4.16 MIL/uL (ref 3.87–5.11)
RDW: 14.4 % (ref 11.5–15.5)
WBC: 5.8 10*3/uL (ref 4.0–10.5)
nRBC: 0 % (ref 0.0–0.2)

## 2021-06-10 LAB — TROPONIN I (HIGH SENSITIVITY)
Troponin I (High Sensitivity): 11 ng/L (ref <18)
Troponin I (High Sensitivity): 13 ng/L (ref <18)

## 2021-06-10 LAB — BASIC METABOLIC PANEL
Anion gap: 10 (ref 5–15)
BUN: 16 mg/dL (ref 8–23)
CO2: 23 mmol/L (ref 22–32)
Calcium: 9.3 mg/dL (ref 8.9–10.3)
Chloride: 108 mmol/L (ref 98–111)
Creatinine, Ser: 0.65 mg/dL (ref 0.44–1.00)
GFR, Estimated: >60 mL/min (ref >60)
Glucose, Bld: 92 mg/dL (ref 70–99)
Potassium: 2.9 mmol/L — ABNORMAL LOW (ref 3.5–5.1)
Sodium: 141 mmol/L (ref 135–145)

## 2021-06-10 NOTE — ED Triage Notes (Signed)
Was at urgent care earlier. Pt c/o hypertension - BP with fire was "287/170". BP issue x 3 days. Compliant with BP meds.   Pt only reports headache. [id number]. No blurry vision or dizziness.

## 2021-06-10 NOTE — Telephone Encounter (Signed)
Patient returns call to nurse line regarding continued issues with swelling and pain in ankles. Patient reports that on left ankle she can feel a small knot.   Patient also reports elevated BP readings over the last few days. AM BP 177/110. Reports head has been hurting when she gets up in the morning, however, goes away after being up.   Offered to schedule patient appointment for tomorrow morning. Patient unable to schedule due to work schedule. No other availability until next week.   Provided patient with ED/ UC precautions.   Please advise any additional recommendations.   Talbot Grumbling, RN

## 2021-06-10 NOTE — ED Provider Notes (Signed)
Emergency Medicine Provider Triage Evaluation Note  ACSA ARANGO , a 65 y.o. female  was evaluated in triage.  Pt complains of headache and htn. States has worse in the morning and improve throughout the day. Just started to have some cp as well while in the waiting area. Denies sob  Review of Systems  Positive: Headache, chest pain Negative: sob  Physical Exam  BP (!) 195/114 (BP Location: Right Arm)   Pulse 85   Temp 98.2 F (36.8 C) (Oral)   Resp 16   Ht '5\' 3"'$  (1.6 m)   Wt 108 kg   SpO2 96%   BMI 42.18 kg/m  Gen:   Awake, no distress   Resp:  Normal effort  MSK:   Moves extremities without difficulty  Other:  No focal neuro deficits  Medical Decision Making  Medically screening exam initiated at 7:27 PM.  Appropriate orders placed.  [name] was informed that the remainder of the evaluation will be completed by another provider, this initial triage assessment does not replace that evaluation, and the importance of remaining in the ED until their evaluation is complete.     Rodney Booze, PA-C 06/10/21 1927    [name] Sparrow, DO 06/11/21 0024

## 2021-06-10 NOTE — ED Triage Notes (Signed)
Pt states BP has been running "150/107", "199/160" over past 2 days; went to fire dept for BP check just PTA and "they got 287/170". Pt c/o HAs over past few days; states has been compliant with meds.  Denies vision changes.  Discussed w/ S. [place], [place]. Tamala Julian, NP - both verbalized pt needs to go to ED.  Discussed w/ pt - pt verbalizes understanding.  Patient is being discharged from the Urgent [facility] and sent to the Emergency Department via private vehicle. Per S. [place], [place], patient is stable but in need of higher level of care due to hypertensive urgency. Patient is aware and verbalizes understanding of plan of care.

## 2021-06-11 MED ORDER — ACETAMINOPHEN 325 MG PO TABS
650.0000 mg | ORAL_TABLET | Freq: Once | ORAL | Status: AC
  Administered 2021-06-11: 650 mg via ORAL
  Filled 2021-06-11: qty 2

## 2021-06-11 MED ORDER — POTASSIUM CHLORIDE CRYS ER 20 MEQ PO TBCR
40.0000 meq | EXTENDED_RELEASE_TABLET | Freq: Once | ORAL | Status: AC
  Administered 2021-06-11: 40 meq via ORAL
  Filled 2021-06-11: qty 2

## 2021-06-11 MED ORDER — HYDROCHLOROTHIAZIDE 12.5 MG PO CAPS
12.5000 mg | ORAL_CAPSULE | Freq: Once | ORAL | Status: AC
  Administered 2021-06-11: 12.5 mg via ORAL
  Filled 2021-06-11: qty 1

## 2021-06-11 NOTE — ED Provider Notes (Signed)
[facility] EMERGENCY DEPARTMENT Provider Note   CSN: [id number] Arrival date & time: 06/10/21  1856     History Chief Complaint  Patient presents with   Hypertension    [name] is a 65 y.o. female.  Patient is a 65 year old female with past medical history of hypertension is presenting for headache and elevated blood pressure.  Patient states that she has had a headache for the last 3 days.  She states that is located in the middle of her forehead and does not radiate.  She denies any blurred vision, photophobia, phonophobia.  She denies any numbness or weakness.  She states that she takes HCTZ for blood pressure medication.  She recently stopped taking Lasix.  Patient also concerned that she has had elevated blood pressures.  She is unsure of how high her blood pressure has been but wanted to have her blood pressure checked.  She denies any leg swelling.  Patient denies any fever, chills, neck stiffness, chest pain, shortness breath, nausea, vomiting, diarrhea, numbness or weakness.   Hypertension This is a chronic problem. Associated symptoms include headaches. Pertinent negatives include no chest pain, no abdominal pain and no shortness of breath. She has tried nothing for the symptoms.  Headache Pain location:  Frontal Quality:  Dull Radiates to:  Does not radiate Severity currently:  6/10 Progression:  Improving Context: not exposure to bright light, not caffeine and not coughing   Relieved by:  None tried Associated symptoms: no abdominal pain, no back pain, no blurred vision, no congestion, no cough, no diarrhea, no dizziness, no drainage, no ear pain, no eye pain, no facial pain, no fatigue, no fever, no focal weakness, no hearing loss, no loss of balance, no myalgias, no nausea, no near-syncope, no neck pain, no neck stiffness, no numbness, no paresthesias, no photophobia, no seizures, no sinus pressure, no sore throat, no swollen glands, no syncope,  no tingling, no URI, no visual change, no vomiting and no weakness       Past Medical History:  Diagnosis Date   AC (acromioclavicular) joint arthritis 12/23/2017   Left side   Acute renal failure ([place]) 09/24/2016   Allergy    Anemia    Anterior epistaxis 02/27/2021   Arthritis    knees   Back pain 10/04/2012   Baker's cyst of knee, right 10/17/2019   Baker's cyst of knee, right 10/17/2019   Biliary sludge 04/08/2014   Taking ursodiol chronically     Blood in stool 10/07/2014   Blood transfusion without reported diagnosis    Bunion 03/28/2014   Cellulitis of leg, left 09/14/2016   Cervical pain (neck) 03/26/2016   Chest pain 05/12/2015   Constipation 10/19/2013   Chronic in the setting of known diverticulosis and history of bleeds.     Cramping of hands 02/27/2016   CYST, KIDNEY, ACQUIRED 08/15/2007   Qualifier: Diagnosis of  By: Mellody Drown MD, [place] 11/30/2010   Qualifier: Diagnosis of  By: Buelah Manis MD, Lonell Grandchild     Degenerative arthritis of right shoulder region 08/2013   Diverticulosis    Diverticulosis of colon with hemorrhage    Hx  Of recurrent Diverticular bleeding - several prior admissions    Diverticulosis of colon with hemorrhage,  s/p colectomy ileorectal anastamosis    Hx  Of recurrent Diverticular bleeding - 4 to 5  prior admissions for GI bleeding 2016 and before. Seen inhouse by  GI in 2016   Diverticulosis  of colon without hemorrhage 11/18/2014   Noted on colonoscopy 11/2014    External hemorrhoids without complication [id number]   Family history of colon cancer in father diagnosed in 18s 06/21/2019   Gastrojejunal ulcer with hemorrhage    Gastrojejunal ulcer with hemorrhage    GERD (gastroesophageal reflux disease)    High cholesterol    History of GI bleed    History of iron deficiency anemia 12/29/2006       History of stroke 02/13/2014   Overview:  Dx on MRI when eval for h/a but denies any sxs   HX of Diverticulosis of colon  with hemorrhage,  s/p colectomy ileorectal anastamosis    Hx  Of recurrent Diverticular bleeding - 4 to 5  prior admissions for GI bleeding 2016 and before. Seen inhouse by [facility] in 2016   Hx of gastrointestinal hemorrhage 05/18/2019   Extending back 2001, 2007 bleeds. 05/2013 colonoscopy.  Dr. Deatra Ina.  For hematochezia, first-degree relative with colon cancer, personal history adenomatous colon polyps.  Sessile, sigmoid polyp removed; path consistent with inflammatory polyp..  Dense pandiverticulosis.  No fresh or old blood encountered.  Presumed to have had diverticular bleed. 11/2014 colonoscopy For hematochezia.  Pandiverticul   Hypertension    under control with meds., has been on med. > 20 yr.   Hypopigmented skin lesion 02/23/2011   Ileus following gastrointestinal surgery ([place]) 12/12/2015   Intractable vomiting 09/24/2016   Leg cramping 04/22/2014   Lesion of right lower eyelid 02/27/2021   Lipoma of lower extremity 03/07/2013   Overview:  RIGHT THIGH   Lower GI bleed    Lumbar back pain 03/26/2016   Medial meniscus tear 1997   Right Knee   Mini stroke ([place])    Morbid obesity ([place]) 12/29/2006   Multiple open wounds of lower leg 11/12/2016   Muscle pain 05/26/2015   Orthostatic dizziness 08/26/2010   Qualifier: Diagnosis of  By: Hyman Hopes     Peripheral neuropathy 05/20/2017   Premature supraventricular beats 12/25/2015   EKG 12/25/15    Rash and nonspecific skin eruption 04/01/2017   Right knee prosthesis with joint effusion ([place]) 05/18/2019   CT knee 04/2019 :RIGHT knee prosthesis with moderate knee joint effusion   Rotator cuff rupture, complete 08/2013   right   Shoulder impingement 08/2013   right   Skin lesion of right lower extremity 05/27/2016   Small bowel obstruction ([place]) 12/12/2015   Snoring 08/24/2018   Stroke ([place]) 2006   Remote left lacunar infarct noted on CT head 2006, 2010    Subacromial bursitis 05/21/2013   Superficial thrombophlebitis 01/29/2016   Suspected  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   See assessment/plan from 03/22/2019 office visit with colposcopic evaluation by Dr Serafina Royals (FM) in [facility]  suspected   Tingling 11/17/2017   Type 2 diabetes mellitus with other specified complication, without long-term current use of insulin ([place])    Ulcer    Ulcer at site of surgical anastomosis following bypass of stomach 05/18/2019   Vaginal bleeding 05/17/2019   See 03/12/2019 Telephone call note and Office visit 03/23/2019 with Dorcas Mcmurray MD in [facility] clinic   Venous stasis ulcer of left ankle with fat layer exposed with varicose veins ([place]) 10/20/2016   Wears dentures    upper   Wears partial dentures    lower    Patient Active Problem List   Diagnosis Date Noted   BMI 40.0-44.9, adult ([place]) 05/22/2021   Dehydration with hypernatremia 05/22/2021   Osteoarthritis, bilateral  hands/fingers 04/02/2021   Type 2 diabetes mellitus with other specified complication, without long-term current use of insulin ([place]) 05/05/2020   Venous insufficiency of both lower extremities, Right > Left leg 10/12/2019   Hx of gastrointestinal hemorrhage 05/18/2019   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   Esophageal dysmotility 02/14/2017   DDD (degenerative disc disease), lumbar 04/14/2016   Osteoarthritis of both knees 03/12/2016   History of stroke 02/13/2014   Hyperlipidemia associated with type 2 diabetes mellitus ([place]) 02/13/2014   Insomnia 08/29/2012   GERD (gastroesophageal reflux disease) 08/24/2012   Asthma, cough variant 03/10/2012   Hypertension associated with diabetes ([place]) 01/05/2012   Allergic rhinitis 05/27/2010   Morbid obesity ([place]) 12/29/2006   Venous (peripheral) insufficiency 12/29/2006    Past Surgical History:  Procedure Laterality Date   ABDOMINAL HYSTERECTOMY  ?1987   partial   COLONOSCOPY  05/02/2000; 05/08/2013, 11/08/14   COSMETIC SURGERY  02/22/2014   skin removal surgery from lower abdomen    ENDOVENOUS ABLATION  SAPHENOUS VEIN W/ LASER Right 08/12/2016   endovenous laser ablation right greater saphenous vein by Curt Jews MD    ENDOVENOUS ABLATION SAPHENOUS VEIN W/ LASER Left 10/21/2016   EVLA L GSV by Curt Jews MD   ESOPHAGOGASTRODUODENOSCOPY N/A 05/17/2019   Procedure: ESOPHAGOGASTRODUODENOSCOPY (EGD);  Surgeon: Gatha Mayer, MD;  Location[facility] ENDOSCOPY;  Service: Endoscopy;  Laterality: N/A;   HEMOSTASIS CLIP PLACEMENT  05/17/2019   Procedure: HEMOSTASIS CLIP PLACEMENT;  Surgeon: Gatha Mayer, MD;  Location: [facility] ENDOSCOPY;  Service: Endoscopy;;   METATARSAL OSTEOTOMY Right 1995   Right Bunion Repair   ROTATOR CUFF REPAIR Left 04/27/2018   ROUX-EN-Y GASTRIC BYPASS  05/07/2012   SCLEROTHERAPY  05/17/2019   Procedure: Clide Deutscher;  Surgeon: Gatha Mayer, MD;  Location: [facility] ENDOSCOPY;  Service: Endoscopy;;   SHOULDER ARTHROSCOPY WITH ROTATOR CUFF REPAIR AND SUBACROMIAL DECOMPRESSION Right 08/09/2013   Procedure: RIGHT SHOULDER ARTHROSCOPY WITH SUBACROMIAL DECOMPRESSION, DISTAL CLAVICLE EXCISION AND ROTATOR CUFF REPAIR and release biceps;  Surgeon: Ninetta Lights, MD;  Location: [place];  Service: Orthopedics;  Laterality: Right;   SUBTOTAL COLECTOMY  11/24/2015   [facility]: laparoscopic-assisted subtotal colectomy with ileorectal anastomosis.   TOTAL KNEE ARTHROPLASTY Left    TOTAL KNEE ARTHROPLASTY Right      OB History   No obstetric history on file.     Family History  Problem Relation Age of Onset   Cancer Father 44       Died from complications of colon CA   Colon cancer Father        died in his 7s   Diabetes Mother    Hypertension Mother    Colon polyps Brother    Diabetes Sister    Heart disease Sister    Diabetes Sister    Stroke Sister    Diabetes Brother    Stroke Brother    Colon polyps Brother    Diabetes Brother    Diabetes Brother    Diabetes Brother    Hypertension Son    Esophageal cancer Neg Hx    Rectal cancer Neg Hx    Stomach  cancer Neg Hx     Social History   Tobacco Use   Smoking status: Former    Packs/day: 0.10    Years: 6.00    Pack years: 0.60    Types: Cigarettes    Quit date: 1976    Years since quitting: 46.6   Smokeless tobacco: Never  Vaping Use   Vaping Use: Never used  Substance Use Topics   Alcohol use: Yes    Alcohol/week: 0.0 standard drinks    Comment: occasional wine   Drug use: No    Home Medications Prior to Admission medications   Medication Sig Start Date End Date Taking? Authorizing Provider  ascorbic acid (VITAMIN C) 250 MG tablet Take by mouth.    [provider]  aspirin 81 MG chewable tablet Chew 81 mg by mouth daily.    [provider]  atorvastatin (LIPITOR) 40 MG tablet Take 1 tablet (40 mg total) by mouth daily at 6 PM. TAKE ONE TABLET DAILY 07/26/19 10/24/19  McDiarmid, Blane Ohara, MD  calcium carbonate (OS-CAL) 600 MG TABS tablet Take 1 tablet (600 mg total) by mouth 2 (two) times daily with a meal. 01/07/14   Funches, Adriana Mccallum, MD  cetirizine (ZYRTEC) 10 MG tablet Take 1 tablet (10 mg total) by mouth daily. Patient taking differently: Take 10 mg by mouth daily as needed for allergies.  05/19/16   Mercy Riding, MD  docusate sodium (COLACE) 100 MG capsule TAKE ONE CAPSULE TWICE A DAY 11/28/20   McDiarmid, Blane Ohara, MD  furosemide (LASIX) 40 MG tablet TAKE ONE TABLET EACH DAY 05/22/21   McDiarmid, Blane Ohara, MD  hydrochlorothiazide (MICROZIDE) 12.5 MG capsule TAKE ONE CAPSULE EACH DAY 01/22/21   McDiarmid, Blane Ohara, MD  IRON SUPPLEMENT 220 (44 Fe) MG/5ML solution TAKE 70m ('220mg'$  TOTAL) BY MOUTH DAILY WITH BREAKFAST 03/24/21   McDiarmid, TBlane Ohara MD  Multiple Vitamin (MULTIVITAMIN) tablet Take 1 tablet by mouth daily. 01/07/14   FBoykin Nearing MD  omeprazole (PRILOSEC) 40 MG capsule TAKE ONE CAPSULE EACH DAY BEFORE BREAKFAST -OPEN CAPSULE AND PLACE IN LIQUID IF POSSIBLE 08/19/20   GGatha Mayer MD  potassium chloride SA (KLOR-CON) 20 MEQ tablet Take 1 tablet (20 mEq  total) by mouth daily. 10/11/19   McDiarmid, TBlane Ohara MD  Semaglutide,0.25 or 0.'5MG'$ /DOS, 2 MG/1.5ML SOPN Inject 0.25 mg into the skin once a week for 28 days, THEN 0.5 mg once a week for 7 days, THEN 1 mg once a week for 7 days. 05/21/21 07/02/21  McDiarmid, TBlane Ohara MD  sucralfate (CARAFATE) 1 GM/10ML suspension TAKE 169m(2 TEASPOONSFUL) BY MOUTH 4 TIMES DAILY WITH MEALS AND AT BEDTIME 08/21/20   McDiarmid, ToBlane OharaMD  vitamin B-12 (CYANOCOBALAMIN) 1000 MCG tablet Take 1,000 mcg by mouth daily.    [provider]  vitamin C (ASCORBIC ACID) 250 MG tablet Take 250 mg by mouth daily.    [provider]  vitamin E 180 MG (400 UNITS) capsule Take by mouth.    [provider]  Vitamins A & D 5000-400 units CAPS Take 5,000 Units by mouth daily. 03/16/16   [provider]  zolpidem (AMBIEN) 5 MG tablet TAKE ONE TABLET AT NIGHT AS NEEDED FOR SLEEP 05/22/21   McDiarmid, ToBlane OharaMD    Allergies    Aspirin, Pantoprazole sodium, Nsaids, and Tolmetin  Review of Systems   Review of Systems  Constitutional:  Negative for chills, diaphoresis, fatigue and fever.  HENT:  Negative for congestion, dental problem, ear discharge, ear pain, facial swelling, hearing loss, nosebleeds, postnasal drip, rhinorrhea, sinus pressure, sinus pain, sneezing, sore throat and trouble swallowing.   Eyes:  Negative for blurred vision, photophobia, pain and visual disturbance.  Respiratory:  Negative for cough, chest tightness, shortness of breath, wheezing and stridor.   Cardiovascular:  Negative for chest pain,  palpitations, leg swelling, syncope and near-syncope.  Gastrointestinal:  Negative for abdominal distention, abdominal pain, blood in stool, constipation, diarrhea, nausea and vomiting.  Endocrine: Negative for polydipsia and polyuria.  Genitourinary:  Negative for difficulty urinating, dysuria, flank pain, frequency, hematuria, urgency, vaginal bleeding and vaginal discharge.   Musculoskeletal:  Negative for back pain, myalgias, neck pain and neck stiffness.  Skin:  Negative for rash and wound.  Allergic/Immunologic: Negative for environmental allergies and food allergies.  Neurological:  Positive for headaches. Negative for dizziness, focal weakness, seizures, syncope, facial asymmetry, speech difficulty, weakness, light-headedness, numbness, paresthesias and loss of balance.  Psychiatric/Behavioral:  Negative for agitation, behavioral problems and confusion.    Physical Exam Updated Vital Signs BP (!) 192/109 (BP Location: Right Arm)   Pulse 72   Temp 97.9 F (36.6 C)   Resp 11   Ht '5\' 3"'$  (1.6 m)   Wt 108 kg   SpO2 99%   BMI 42.18 kg/m   Physical Exam Vitals and nursing note reviewed.  Constitutional:      General: She is not in acute distress.    Appearance: Normal appearance. She is normal weight. She is not ill-appearing.  HENT:     Head: Normocephalic and atraumatic.     Right Ear: External ear normal.     Left Ear: External ear normal.     Nose: Nose normal. No congestion.     Mouth/Throat:     Mouth: Mucous membranes are moist.     Pharynx: Oropharynx is clear. No oropharyngeal exudate or posterior oropharyngeal erythema.  Eyes:     General: No visual field deficit.    Extraocular Movements: Extraocular movements intact.     Conjunctiva/sclera: Conjunctivae normal.     Pupils: Pupils are equal, round, and reactive to light.  Neck:     Vascular: No carotid bruit.  Cardiovascular:     Rate and Rhythm: Normal rate and regular rhythm.     Pulses: Normal pulses.     Heart sounds: Normal heart sounds. No murmur heard. Pulmonary:     Effort: Pulmonary effort is normal. No respiratory distress.     Breath sounds: Normal breath sounds. No stridor. No wheezing, rhonchi or rales.  Chest:     Chest wall: No tenderness.  Abdominal:     General: Bowel sounds are normal. There is no distension.     Palpations: Abdomen is soft.     Tenderness:  There is no abdominal tenderness. There is no right CVA tenderness, left CVA tenderness, guarding or rebound.  Musculoskeletal:        General: No swelling or tenderness. Normal range of motion.     Cervical back: Normal range of motion and neck supple. No rigidity, tenderness or bony tenderness.     Thoracic back: Normal. No tenderness or bony tenderness.     Lumbar back: Normal. No tenderness or bony tenderness.     Right lower leg: No edema.     Left lower leg: No edema.  Skin:    General: Skin is warm and dry.     Coloration: Skin is not jaundiced.  Neurological:     General: No focal deficit present.     Mental Status: She is alert and oriented to person, place, and time. Mental status is at baseline.     Cranial Nerves: Cranial nerves are intact. No cranial nerve deficit, dysarthria or facial asymmetry.     Sensory: Sensation is intact. No sensory deficit.     Motor:  Motor function is intact. No weakness.     Coordination: Coordination is intact. Finger-Nose-Finger Test normal.     Gait: Gait is intact. Gait normal.  Psychiatric:        Mood and Affect: Mood normal.        Behavior: Behavior normal.        Thought Content: Thought content normal.        Judgment: Judgment normal.    ED Results / Procedures / Treatments   Labs (all labs ordered are listed, but only abnormal results are displayed) Labs Reviewed  BASIC METABOLIC PANEL - Abnormal; Notable for the following components:      Result Value   Potassium 2.9 (*)    All other components within normal limits  CBC WITH DIFFERENTIAL/PLATELET  TROPONIN I (HIGH SENSITIVITY)  TROPONIN I (HIGH SENSITIVITY)    EKG EKG Interpretation  Date/Time:  Wednesday June 10 2021 19:33:12 EDT Ventricular Rate:  76 PR Interval:  158 QRS Duration: 94 QT Interval:  386 QTC Calculation: 434 R Axis:   2 Text Interpretation: Normal sinus rhythm Minimal voltage criteria for LVH, may be normal variant ( Cornell product ) Nonspecific  T wave abnormality Abnormal ECG When compared with ECG of 09/24/2016, No significant change was found Confirmed by Delora Fuel ([id number]) on 06/10/2021 11:21:32 PM  Radiology DG Chest 2 View  Result Date: 06/10/2021 CLINICAL DATA:  Chest pain EXAM: CHEST - 2 VIEW COMPARISON:  04/28/2019 FINDINGS: The heart size and mediastinal contours are within normal limits. Both lungs are clear. The visualized skeletal structures are unremarkable. IMPRESSION: No active cardiopulmonary disease. Electronically Signed   By: Donavan Foil M.D.   On: 06/10/2021 20:18   CT HEAD WO CONTRAST (5MM)  Result Date: 06/10/2021 CLINICAL DATA:  Headache EXAM: CT HEAD WITHOUT CONTRAST TECHNIQUE: Contiguous axial images were obtained from the base of the skull through the vertex without intravenous contrast. COMPARISON:  10/02/2009 FINDINGS: Brain: No acute intracranial abnormality. Specifically, no hemorrhage, hydrocephalus, mass lesion, acute infarction, or significant intracranial injury. Vascular: No hyperdense vessel or unexpected calcification. Skull: No acute calvarial abnormality. Sinuses/Orbits: No acute findings Other: None IMPRESSION: No acute intracranial abnormality.  Into the Electronically Signed   By: Rolm Baptise M.D.   On: 06/10/2021 21:09    Procedures Procedures   Medications Ordered in ED Medications  potassium chloride SA (KLOR-CON) CR tablet 40 mEq (has no administration in time range)  hydrochlorothiazide (MICROZIDE) capsule 12.5 mg (has no administration in time range)  acetaminophen (TYLENOL) tablet 650 mg (has no administration in time range)    ED Course  I have reviewed the triage vital signs and the nursing notes.  Pertinent labs & imaging results that were available during my care of the patient were reviewed by me and considered in my medical decision making (see chart for details).    MDM Rules/Calculators/A&P                         Zahava ADAM SCHEPPS is a 65 y.o. female with a past medical  history of hypertension presenting for headache and elevated blood pressures.  On arrival to our ED patient is hemodynamically stable and in no acute distress.  Patient's physical exam is unremarkable.  Patient states that she recently stopped taking her Lasix and is only taking HCTZ 12.5 mg.  Patient's blood pressure is elevated in the setting of not taking her blood pressure medications in the last 24 hours.  Patient  does not have any signs of end organ damage. Patient took her blood pressure at home and noted that it was elevated.  Patient given a dose of her blood pressure medication in the ED.  Patient's headache is a 6 out of 10 and is nonradiating.  She has no weakness or changes in sensation.  Patient states that her headache is resolving.  She was given Tylenol while in the ED.  Patient currently states that she feels her headache is improving.  Her neurological exam is notable for no focal neurodeficits.  Her CT head showed no acute intracranial abnormality.    Her BMP was notable for a potassium of 2.9.  She was supplemented with potassium chloride in the ED.  She will follow-up with her PCP for repeat BMP to monitor potassium level.  Her CBC was within normal limits.  Patient denies any chest pain or shortness of breath.  Her chest x-ray shows no acute cardiopulmonary abnormality.  Her EKG shows no signs of acute ischemia. Troponins x2 were negative.  Patient will follow up with her PCP, Sherren Mocha McDiarmid MD for management of hypertension.   Patient states compliance and understanding of the plan. I explained labs and imaging to the patient. No further questions at this time from the patient.  The patient is safe and stable for discharge at this time with return precautions provided and a plan for follow-up care in place as needed  The plan for this patient was discussed with Dr. Roslynn Amble, who voiced agreement and who oversaw evaluation and treatment of this patient.   Final Clinical  Impression(s) / ED Diagnoses Final diagnoses:  Nonintractable headache, unspecified chronicity pattern, unspecified headache type  Hypokalemia    Rx / DC Orders ED Discharge Orders     None        Doretha Sou, MD 06/11/21 [id number]    Lucrezia Starch, MD 06/11/21 1253

## 2021-06-11 NOTE — ED Notes (Signed)
Pt verbalized understanding of d/c instructions, meds and followup care. Denies questions. VSS, no distress noted. Steady gait to exit.  

## 2021-06-11 NOTE — Discharge Instructions (Signed)
Please follow up with your PCP for management of blood pressure medications. Please take tylenol as needed for a headache. Thank you for letting us care for you today. Please return to the ED if you experience chest pain, shortness of breath.

## 2021-06-17 ENCOUNTER — Telehealth: Payer: Self-pay | Admitting: Podiatry

## 2021-06-17 NOTE — Telephone Encounter (Signed)
Pt left message this morning stating she thinks she has an appt today at 3 something and needed to confirm to get transportation.  I returned call and told pt we actually have her scheduled for tomorrow 8.18 @ 315. She said thank you

## 2021-06-18 ENCOUNTER — Other Ambulatory Visit: Payer: Self-pay

## 2021-06-18 ENCOUNTER — Ambulatory Visit (INDEPENDENT_AMBULATORY_CARE_PROVIDER_SITE_OTHER): Payer: Medicare HMO | Admitting: *Deleted

## 2021-06-18 DIAGNOSIS — M2042 Other hammer toe(s) (acquired), left foot: Secondary | ICD-10-CM

## 2021-06-18 DIAGNOSIS — E1169 Type 2 diabetes mellitus with other specified complication: Secondary | ICD-10-CM

## 2021-06-18 DIAGNOSIS — E1151 Type 2 diabetes mellitus with diabetic peripheral angiopathy without gangrene: Secondary | ICD-10-CM

## 2021-06-18 DIAGNOSIS — M2142 Flat foot [pes planus] (acquired), left foot: Secondary | ICD-10-CM

## 2021-06-18 DIAGNOSIS — M2041 Other hammer toe(s) (acquired), right foot: Secondary | ICD-10-CM

## 2021-06-18 DIAGNOSIS — M2141 Flat foot [pes planus] (acquired), right foot: Secondary | ICD-10-CM

## 2021-06-18 DIAGNOSIS — B351 Tinea unguium: Secondary | ICD-10-CM

## 2021-06-18 DIAGNOSIS — L84 Corns and callosities: Secondary | ICD-10-CM

## 2021-06-18 NOTE — Progress Notes (Signed)
Patient presents to the office today for diabetic shoe and insole measuring.  Patient was measured with brannock device to determine size and width for 1 pair of extra depth shoes and foam casted for 3 pair of insoles.   Documentation of medical necessity will be sent to patient's treating diabetic doctor to verify and sign.   Patient's diabetic provider: Dr. Sherren Mocha McDiarmid  Shoes and insoles will be ordered at that time and patient will be notified for an appointment for fitting when they arrive.   Shoe size (per patient): 9.5-10   Brannock measurement: RIGHT - 10.5 B,  LEFT - 9.5 C  Patient shoe selection-   1st choice:   NB [id number]  2nd choice:  Apex [id number]  Shoe size ordered: Women's 10 Wide

## 2021-06-24 ENCOUNTER — Other Ambulatory Visit: Payer: Self-pay | Admitting: Family Medicine

## 2021-06-24 DIAGNOSIS — E1169 Type 2 diabetes mellitus with other specified complication: Secondary | ICD-10-CM

## 2021-07-09 ENCOUNTER — Other Ambulatory Visit: Payer: Self-pay

## 2021-07-09 ENCOUNTER — Ambulatory Visit (INDEPENDENT_AMBULATORY_CARE_PROVIDER_SITE_OTHER): Payer: Medicare HMO | Admitting: Family Medicine

## 2021-07-09 ENCOUNTER — Encounter: Payer: Self-pay | Admitting: Family Medicine

## 2021-07-09 VITALS — BP 162/97 | HR 94 | Ht 63.0 in | Wt 231.6 lb

## 2021-07-09 DIAGNOSIS — E876 Hypokalemia: Secondary | ICD-10-CM

## 2021-07-09 DIAGNOSIS — Z79899 Other long term (current) drug therapy: Secondary | ICD-10-CM | POA: Diagnosis not present

## 2021-07-09 DIAGNOSIS — E1169 Type 2 diabetes mellitus with other specified complication: Secondary | ICD-10-CM

## 2021-07-09 DIAGNOSIS — E2839 Other primary ovarian failure: Secondary | ICD-10-CM

## 2021-07-09 DIAGNOSIS — Z9884 Bariatric surgery status: Secondary | ICD-10-CM

## 2021-07-09 DIAGNOSIS — E785 Hyperlipidemia, unspecified: Secondary | ICD-10-CM

## 2021-07-09 DIAGNOSIS — E1159 Type 2 diabetes mellitus with other circulatory complications: Secondary | ICD-10-CM | POA: Diagnosis not present

## 2021-07-09 DIAGNOSIS — Z0000 Encounter for general adult medical examination without abnormal findings: Secondary | ICD-10-CM | POA: Diagnosis not present

## 2021-07-09 DIAGNOSIS — I152 Hypertension secondary to endocrine disorders: Secondary | ICD-10-CM

## 2021-07-09 DIAGNOSIS — Z23 Encounter for immunization: Secondary | ICD-10-CM

## 2021-07-09 MED ORDER — POTASSIUM CHLORIDE CRYS ER 20 MEQ PO TBCR: 40.0000 meq | EXTENDED_RELEASE_TABLET | Freq: Every day | ORAL | 99 refills | Status: DC

## 2021-07-09 MED ORDER — OLMESARTAN MEDOXOMIL 20 MG PO TABS
20.0000 mg | ORAL_TABLET | Freq: Every day | ORAL | 3 refills | Status: DC
Start: 2021-07-09 — End: 2022-03-25

## 2021-07-09 MED ORDER — ZOSTER VAC RECOMB ADJUVANTED 50 MCG/0.5ML IM SUSR: 0.5000 mL | Freq: Once | INTRAMUSCULAR | 0 refills | Status: AC

## 2021-07-09 NOTE — Assessment & Plan Note (Signed)
Likely related to furosemide and hydrochlorothiazide medications Alexza is taking for hypertension. Reduced dose of furosemide at previous visits due to labs indicating dehydration. Holding off on reducing medications further until BP is under better control. Resent prescription for potassium chloride. Provided education that potassium chloride is important to take daily given [name] is on two diuretics which can reduce her potassium level and hypokalemia found on recent set of labs. Obtaining BMP today to assess hypokalemia. Will adjust dose of potassium chloride accordingly.

## 2021-07-09 NOTE — Assessment & Plan Note (Addendum)
Poorly controlled at 162/97 today and elevated home BP. Given persistent elevated BP at home and vision changes, initiating '20mg'$  benicar daily to be taken in the morning. Choosing this drug for renoprotective effects given diabetes diagnosis and to minimize hypokalemia exacerbation from current diuretic regimen. Scheduled follow-up in a week to assess kidney function on benicar. Scheduled follow-up in a month to evaluate BP. Obtaining BMP today to assess hypokalemia from ED labs and assess kidney function. Believe headaches to be related to hypertension. Will reassess frequency and quality of headaches as BP is controlled.

## 2021-07-09 NOTE — Progress Notes (Signed)
    SUBJECTIVE:   CHIEF COMPLAINT / HPI:   [name] is here for a follow-up visit for diabetes and hypertension.  Diabetes - HbA1C at last visit on 05/21/21 was 5.6. She is taking semaglutide as prescribed.   Hypertension - BP today in office was 162/97. It is lower today than previous recorded visits that were 192/109 and 173/93. BP at home averages around 170/110 with the bottom number going as high as 116. When those happen she often sees specs in her vision and has occasional dizziness. She has also been getting headaches across her whole head which are new. Farra had been to the ED once for her hypertension but they didn't do anything there, so she hasn't been back since.  Headache - She believes these are related to blood pressure as they are newer and have coincided with higher blood pressures. The pain has gotten as bad as 10/10 but recently has been 8/10.  Hypokalemia - Noted on her last labs obtained at the ED. She hasn't felt any palpitations or chest pain other than GERD. When reviewing medications she does not recall taking the potassium chloride recently.  Boil on butt - She has noticed a new boil forming on her butt. She does not want it examined today but inquires as to what she can do for it.   PERTINENT  PMH / PSH:  HTN associated w diabetes T2DM Hyperlipidemia GERD Venous insufficiency Morbid obesity  OBJECTIVE:   BP (!) 162/97   Pulse 94   Ht '5\' 3"'$  (1.6 m)   Wt 231 lb 9.6 oz (105.1 kg)   SpO2 100%   BMI 41.03 kg/m   GEN: Well appearing, in no acute distress, alert and oriented CARD: Normal rate and rhythm. No murmurs or gallops SKIN: Warm and dry.  NEUR: Grossly normal. Sensation and strength intact.    ASSESSMENT/PLAN:   Hyperlipidemia associated with type 2 diabetes mellitus (HCC) Previous HbA1C from 05/21/21 was 5.6. Will recheck at next visit BP check scheduled for a month. Continue current regimen of weekly semaglutide given stable HbA1C at  goal.  Hypertension associated with diabetes ([place]) Poorly controlled at 162/97 today and elevated home BP. Given persistent elevated BP at home and vision changes, initiating '20mg'$  benicar daily to be taken in the morning. Choosing this drug for renoprotective effects given diabetes diagnosis and to minimize hypokalemia exacerbation from diuretics. Scheduled follow-up in a week to assess kidney function on benicar. Scheduled follow-up in a month to evaluate BP. Obtaining BMP today to assess hypokalemia from ED labs and assess kidney function.  Hypokalemia Likely related to furosemide and hydrochlorothiazide medications Adabelle is taking for hypertension. Reduced dose of furosemide at previous visits due to labs indicating dehydration. Holding off on reducing medications further until BP is under better control. Resent prescription for potassium chloride. Provided education that potassium chloride is important to take daily given Katryn is on two diuretics which can reduce her potassium level and hypokalemia found on recent set of labs. Obtaining BMP today to assess hypokalemia. Will adjust dose of potassium chloride accordingly.   Health Maintenance Received flu vaccine today. Gave prescription for Shingrix to be obtained at local pharmacy. Counseled Takenya on possible side effects of vaccine that can feel like the common cold. Sent referral for DEXA scan for osteoporosis evaluation.   [place]

## 2021-07-09 NOTE — Patient Instructions (Addendum)
Your blood pressure is still up some.  Please start olmesartan (Benicar) 50 mg tablet, one tablet a day.  Keep taking your Hydrochlorothiazide and Lasix.    We are checking your potassium today.  Dr Hendrix Yurkovich will call you if he needs you to start taking the potassium pills again.   Please come by the [facility] next Thursday or Friday to check your kidney's and potassium.  Stop by your pharmacy to get your Shingles vaccination.    A referral was made for [facility]  This is to look for osteoporosis.   You received your Flu and pneumonia vaccinations today.   Dr Torian Thoennes would like to see you back in a month to see how well your blood pressure is controlled.

## 2021-07-09 NOTE — Assessment & Plan Note (Signed)
Previous HbA1C from 05/21/21 was 5.6. Will recheck at next visit BP check scheduled for a month. Continue current regimen of weekly semaglutide given stable HbA1C at goal.

## 2021-07-10 ENCOUNTER — Other Ambulatory Visit: Payer: Self-pay | Admitting: Family Medicine

## 2021-07-10 ENCOUNTER — Ambulatory Visit (INDEPENDENT_AMBULATORY_CARE_PROVIDER_SITE_OTHER): Payer: Medicare HMO

## 2021-07-10 DIAGNOSIS — Z0000 Encounter for general adult medical examination without abnormal findings: Secondary | ICD-10-CM

## 2021-07-10 LAB — BASIC METABOLIC PANEL
BUN/Creatinine Ratio: 17 (ref 12–28)
BUN: 14 mg/dL (ref 8–27)
CO2: 24 mmol/L (ref 20–29)
Calcium: 9.6 mg/dL (ref 8.7–10.3)
Chloride: 106 mmol/L (ref 96–106)
Creatinine, Ser: 0.82 mg/dL (ref 0.57–1.00)
Glucose: 91 mg/dL (ref 65–99)
Potassium: 3.6 mmol/L (ref 3.5–5.2)
Sodium: 144 mmol/L (ref 134–144)
eGFR: 79 mL/min/{1.73_m2} (ref >59)

## 2021-07-10 NOTE — Progress Notes (Signed)
I have interviewed and examined the patient with MS T. Santanam I agree with their documentation and management in their note for today.

## 2021-07-10 NOTE — Progress Notes (Signed)
Subjective:   [name] is a 65 y.o. female who presents for Medicare Annual (Subsequent) preventive examination.  Patient consented to have virtual visit and was identified by name and date of birth. Method of visit: Telephone  Encounter participants: Patient: [name] - located at Home Nurse/Provider: Dorna Bloom - located at [facility] Others (if applicable): NA   Review of Systems: Defer to PCP  Objective:   Vitals: There were no vitals taken for this visit.  There is no height or weight on file to calculate BMI.  Advanced Directives 07/10/2021 07/09/2021 05/21/2021 02/26/2021 10/16/2020 05/01/2020 01/31/2020  Does Patient Have a Medical Advance Directive? No No No No No No No  Would patient like information on creating a medical advance directive? Yes (MAU/Ambulatory/Procedural Areas - Information given) No - Patient declined No - Patient declined No - Patient declined No - Patient declined No - Patient declined No - Patient declined  Pre-existing out of facility DNR order (yellow form or pink MOST form) - - - - - - -   Tobacco Social History   Tobacco Use  Smoking Status Former   Packs/day: 0.10   Years: 6.00   Pack years: 0.60   Types: Cigarettes   Quit date: 1976   Years since quitting: 46.7  Smokeless Tobacco Never     Counseling given: No plans to restart.  Clinical Intake:  Pre-visit preparation completed: Yes  Pain Score: 0-No pain  How often do you need to have someone help you when you read instructions, pamphlets, or other written materials from your doctor or pharmacy?: 2 - Rarely What is the last grade level you completed in school?: High School  Past Medical History:  Diagnosis Date   AC (acromioclavicular) joint arthritis 12/23/2017   Left side   Acute renal failure ([place]) 09/24/2016   Allergy    Anemia    Anterior epistaxis 02/27/2021   Arthritis    knees   Back pain 10/04/2012   Baker's cyst of knee, right 10/17/2019   Baker's cyst of knee,  right 10/17/2019   Biliary sludge 04/08/2014   Taking ursodiol chronically     Blood in stool 10/07/2014   Blood transfusion without reported diagnosis    Bunion 03/28/2014   Cellulitis of leg, left 09/14/2016   Cervical pain (neck) 03/26/2016   Chest pain 05/12/2015   Constipation 10/19/2013   Chronic in the setting of known diverticulosis and history of bleeds.     Cramping of hands 02/27/2016   CYST, KIDNEY, ACQUIRED 08/15/2007   Qualifier: Diagnosis of  By: Mellody Drown MD, [place] 11/30/2010   Qualifier: Diagnosis of  By: Buelah Manis MD, Lonell Grandchild     Degenerative arthritis of right shoulder region 08/2013   Diverticulosis    Diverticulosis of colon with hemorrhage    Hx  Of recurrent Diverticular bleeding - several prior admissions    Diverticulosis of colon with hemorrhage,  s/p colectomy ileorectal anastamosis    Hx  Of recurrent Diverticular bleeding - 4 to 5  prior admissions for GI bleeding 2016 and before. Seen inhouse by [facility] in 2016   Diverticulosis of colon without hemorrhage 11/18/2014   Noted on colonoscopy 11/2014    External hemorrhoids without complication [id number]   Family history of colon cancer in father diagnosed in 103s 06/21/2019   Gastrojejunal ulcer with hemorrhage    Gastrojejunal ulcer with hemorrhage    GERD (gastroesophageal reflux disease)    High  cholesterol    History of GI bleed    History of iron deficiency anemia 12/29/2006       History of stroke 02/13/2014   Overview:  Dx on MRI when eval for h/a but denies any sxs   HX of Diverticulosis of colon with hemorrhage,  s/p colectomy ileorectal anastamosis    Hx  Of recurrent Diverticular bleeding - 4 to 5  prior admissions for GI bleeding 2016 and before. Seen inhouse by [facility] in 2016   Hx of gastrointestinal hemorrhage 05/18/2019   Extending back 2001, 2007 bleeds. 05/2013 colonoscopy.  Dr. Deatra Ina.  For hematochezia, first-degree relative with colon cancer, personal history  adenomatous colon polyps.  Sessile, sigmoid polyp removed; path consistent with inflammatory polyp..  Dense pandiverticulosis.  No fresh or old blood encountered.  Presumed to have had diverticular bleed. 11/2014 colonoscopy For hematochezia.  Pandiverticul   Hypertension    under control with meds., has been on med. > 20 yr.   Hypopigmented skin lesion 02/23/2011   Ileus following gastrointestinal surgery ([place]) 12/12/2015   Intractable vomiting 09/24/2016   Leg cramping 04/22/2014   Lesion of right lower eyelid 02/27/2021   Lipoma of lower extremity 03/07/2013   Overview:  RIGHT THIGH   Lower GI bleed    Lumbar back pain 03/26/2016   Medial meniscus tear 1997   Right Knee   Mini stroke ([place])    Morbid obesity ([place]) 12/29/2006   Multiple open wounds of lower leg 11/12/2016   Muscle pain 05/26/2015   Orthostatic dizziness 08/26/2010   Qualifier: Diagnosis of  By: Hyman Hopes     Peripheral neuropathy 05/20/2017   Premature supraventricular beats 12/25/2015   EKG 12/25/15    Rash and nonspecific skin eruption 04/01/2017   Right knee prosthesis with joint effusion ([place]) 05/18/2019   CT knee 04/2019 :RIGHT knee prosthesis with moderate knee joint effusion   Rotator cuff rupture, complete 08/2013   right   Shoulder impingement 08/2013   right   Skin lesion of right lower extremity 05/27/2016   Small bowel obstruction ([place]) 12/12/2015   Snoring 08/24/2018   Stroke ([place]) 2006   Remote left lacunar infarct noted on CT head 2006, 2010    Subacromial bursitis 05/21/2013   Superficial thrombophlebitis 01/29/2016   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   See assessment/plan from 03/22/2019 office visit with colposcopic evaluation by Dr Serafina Royals (FM) in [facility]  suspected   Tingling 11/17/2017   Type 2 diabetes mellitus with other specified complication, without long-term current use of insulin ([place])    Ulcer    Ulcer at site of surgical anastomosis following bypass of stomach 05/18/2019    Vaginal bleeding 05/17/2019   See 03/12/2019 Telephone call note and Office visit 03/23/2019 with Dorcas Mcmurray MD in [facility]   Venous stasis ulcer of left ankle with fat layer exposed with varicose veins ([place]) 10/20/2016   Wears dentures    upper   Wears partial dentures    lower   Past Surgical History:  Procedure Laterality Date   ABDOMINAL HYSTERECTOMY  ?1987   partial   COLONOSCOPY  05/02/2000; 05/08/2013, 11/08/14   COSMETIC SURGERY  02/22/2014   skin removal surgery from lower abdomen    ENDOVENOUS ABLATION SAPHENOUS VEIN W/ LASER Right 08/12/2016   endovenous laser ablation right greater saphenous vein by Curt Jews MD    ENDOVENOUS ABLATION SAPHENOUS VEIN W/ LASER Left 10/21/2016   EVLA L GSV by Curt Jews MD  ESOPHAGOGASTRODUODENOSCOPY N/A 05/17/2019   Procedure: ESOPHAGOGASTRODUODENOSCOPY (EGD);  Surgeon: Gatha Mayer, MD;  Location: [facility] ENDOSCOPY;  Service: Endoscopy;  Laterality: N/A;   HEMOSTASIS CLIP PLACEMENT  05/17/2019   Procedure: HEMOSTASIS CLIP PLACEMENT;  Surgeon: Gatha Mayer, MD;  Location: [facility] ENDOSCOPY;  Service: Endoscopy;;   METATARSAL OSTEOTOMY Right 1995   Right Bunion Repair   ROTATOR CUFF REPAIR Left 04/27/2018   ROUX-EN-Y GASTRIC BYPASS  05/07/2012   SCLEROTHERAPY  05/17/2019   Procedure: Clide Deutscher;  Surgeon: Gatha Mayer, MD;  Location: [facility] ENDOSCOPY;  Service: Endoscopy;;   SHOULDER ARTHROSCOPY WITH ROTATOR CUFF REPAIR AND SUBACROMIAL DECOMPRESSION Right 08/09/2013   Procedure: RIGHT SHOULDER ARTHROSCOPY WITH SUBACROMIAL DECOMPRESSION, DISTAL CLAVICLE EXCISION AND ROTATOR CUFF REPAIR and release biceps;  Surgeon: Ninetta Lights, MD;  Location: [facility];  Service: Orthopedics;  Laterality: Right;   SUBTOTAL COLECTOMY  11/24/2015   [facility]: laparoscopic-assisted subtotal colectomy with ileorectal anastomosis.   TOTAL KNEE ARTHROPLASTY Left    TOTAL KNEE ARTHROPLASTY Right    Family History  Problem Relation Age of Onset    Cancer Father 59       Died from complications of colon CA   Colon cancer Father        died in his 33s   Diabetes Mother    Hypertension Mother    Colon polyps Brother    Diabetes Sister    Heart disease Sister    Diabetes Sister    Stroke Sister    Diabetes Brother    Stroke Brother    Colon polyps Brother    Diabetes Brother    Diabetes Brother    Diabetes Brother    Hypertension Son    Esophageal cancer Neg Hx    Rectal cancer Neg Hx    Stomach cancer Neg Hx    Social History   Socioeconomic History   Marital status: Divorced    Spouse name: Not on file   Number of children: 1   Years of education: 12   Highest education level: 12th grade  Occupational History   Occupation: child care   Tobacco Use   Smoking status: Former    Packs/day: 0.10    Years: 6.00    Pack years: 0.60    Types: Cigarettes    Quit date: 1976    Years since quitting: 46.7   Smokeless tobacco: Never  Vaping Use   Vaping Use: Never used  Substance and Sexual Activity   Alcohol use: Yes    Alcohol/week: 0.0 standard drinks    Comment: occasional wine   Drug use: No   Sexual activity: Not Currently  Other Topics Concern   Not on file  Social History Narrative   ** Merged History Encounter **       She is divorced and has 1 child She works in a daycare facility Occasional wine former smoker no drug use   Social Determinants of Radio broadcast assistant Strain: Low Risk    Difficulty of Paying Living Expenses: Not hard at all  Food Insecurity: No Food Insecurity   Worried About Charity fundraiser in the Last Year: Never true   Arboriculturist in the Last Year: Never true  Transportation Needs: No Transportation Needs   Lack of Transportation (Medical): No   Lack of Transportation (Non-Medical): No  Physical Activity: Sufficiently Active   Days of Exercise per Week: 3 days   Minutes of Exercise per Session: 60 min  Stress: No Stress Concern Present   Feeling of Stress :  Only a little  Social Connections: Moderately Integrated   Frequency of Communication with Friends and Family: More than three times a week   Frequency of Social Gatherings with Friends and Family: More than three times a week   Attends Religious Services: More than 4 times per year   Active Member of [employer] or Organizations: Yes   Attends Archivist Meetings: More than 4 times per year   Marital Status: Divorced   Outpatient Encounter Medications as of 07/10/2021  Medication Sig   ascorbic acid (VITAMIN C) 250 MG tablet Take by mouth.   aspirin 81 MG chewable tablet Chew 81 mg by mouth daily.   calcium carbonate (OS-CAL) 600 MG TABS tablet Take 1 tablet (600 mg total) by mouth 2 (two) times daily with a meal.   cetirizine (ZYRTEC) 10 MG tablet Take 1 tablet (10 mg total) by mouth daily. (Patient taking differently: Take 10 mg by mouth daily as needed for allergies.)   docusate sodium (COLACE) 100 MG capsule TAKE ONE CAPSULE TWICE A DAY   furosemide (LASIX) 40 MG tablet TAKE ONE TABLET EACH DAY   hydrochlorothiazide (MICROZIDE) 12.5 MG capsule TAKE ONE CAPSULE EACH DAY   IRON SUPPLEMENT 220 (44 Fe) MG/5ML solution TAKE 22m ('220mg'$  TOTAL) BY MOUTH DAILY WITH BREAKFAST   Multiple Vitamin (MULTIVITAMIN) tablet Take 1 tablet by mouth daily.   olmesartan (BENICAR) 20 MG tablet Take 1 tablet (20 mg total) by mouth at bedtime.   omeprazole (PRILOSEC) 40 MG capsule TAKE ONE CAPSULE EACH DAY BEFORE BREAKFAST -OPEN CAPSULE AND PLACE IN LIQUID IF POSSIBLE   potassium chloride SA (KLOR-CON) 20 MEQ tablet Take 2 tablets (40 mEq total) by mouth daily.   Semaglutide, 1 MG/DOSE, (OZEMPIC, 1 MG/DOSE,) 4 MG/3ML SOPN Inject 1 mg into the skin once a week.   sucralfate (CARAFATE) 1 GM/10ML suspension TAKE 135m(2 TEASPOONSFUL) BY MOUTH 4 TIMES DAILY WITH MEALS AND AT BEDTIME   vitamin B-12 (CYANOCOBALAMIN) 1000 MCG tablet Take 1,000 mcg by mouth daily.   vitamin C (ASCORBIC ACID) 250 MG tablet Take 250  mg by mouth daily.   vitamin E 180 MG (400 UNITS) capsule Take by mouth.   Vitamins A & D 5000-400 units CAPS Take 5,000 Units by mouth daily.   atorvastatin (LIPITOR) 40 MG tablet Take 1 tablet (40 mg total) by mouth daily at 6 PM. TAKE ONE TABLET DAILY   No facility-administered encounter medications on file as of 07/10/2021.   Activities of Daily Living In your present state of health, do you have any difficulty performing the following activities: 07/10/2021  Hearing? N  Vision? N  Difficulty concentrating or making decisions? N  Walking or climbing stairs? N  Dressing or bathing? N  Doing errands, shopping? N  Preparing Food and eating ? N  Using the Toilet? N  In the past six months, have you accidently leaked urine? N  Do you have problems with loss of bowel control? N  Managing your Medications? N  Managing your Finances? N  Housekeeping or managing your Housekeeping? N  Some recent data might be hidden   Patient Care Team: McDiarmid, ToBlane OharaMD as PCP - General (Family Medicine) KaDixie DialsMD as Consulting Physician (Cardiology) SyKennith CenterRD as Dietitian (Family Medicine) McWebb LawsODColonas Referring Physician (Optometry) KoCamillo FlamingOD as Consulting Physician (Optometry) BrChristin FudgeMD (Inactive) as Consulting Physician (Surgery) KrLatricia Heft  NP as Nurse Practitioner (Nutrition)    Assessment:   This is a routine wellness examination for [name].  Exercise Activities and Dietary recommendations Current Exercise Habits: Home exercise routine, Type of exercise: treadmill, Time (Minutes): 60, Frequency (Times/Week): 3, Weekly Exercise (Minutes/Week): 180, Exercise limited by: orthopedic condition(s)   Goals      Blood Pressure < 140/90     HEMOGLOBIN A1C < 7.0     Weight < 175 lb (79.379 kg)       Fall Risk Fall Risk  07/10/2021 07/09/2021 05/21/2021 04/02/2021 01/31/2020  Falls in the past year? - 1 1 0 0  Number falls in past yr: '1 1 1 '$ 0 0   Injury with Fall? '1 1 1 '$ - 0  Risk for fall due to : History of fall(s) - - - -   Patient does not report abnormal gait or balance, however patient does have history of falls. Patient does not use assistive devices to ambulate.   Is the patient's home free of loose throw rugs in walkways, pet beds, electrical cords, etc?   yes      Grab bars in the bathroom? yes      Handrails on the stairs?   yes      Adequate lighting?   yes  Patient rating of health (0-10) scale: 10  Depression Screen PHQ 2/9 Scores 07/10/2021 07/09/2021 05/21/2021 04/02/2021  PHQ - 2 Score 0 0 0 0  PHQ- 9 Score 0 0 4 4    Cognitive Function 6CIT Screen 07/10/2021  What Year? 0 points  What month? 0 points  What time? 0 points  Count back from 20 0 points  Months in reverse 0 points  Repeat phrase 0 points  Total Score 0   Immunization History  Administered Date(s) Administered   Fluad Quad(high Dose 65+) 07/09/2021   Hepatitis B 09/07/2006, 11/18/2006, 03/22/2007   Influenza Split 07/16/2011, 07/18/2012   Influenza Whole 08/15/2007, 08/06/2008, 08/18/2009, 08/06/2010   Influenza,inj,Quad PF,6+ Mos 10/19/2013, 07/25/2014, 01/25/2015, 07/17/2015, 08/05/2016, 09/25/2016, 07/01/2017, 11/06/2018, 07/05/2019, 09/02/2020   PFIZER Comirnaty(Gray Top)Covid-19 Tri-Sucrose Vaccine 04/02/2021   PFIZER(Purple Top)SARS-COV-2 Vaccination 12/30/2019, 01/29/2020, 08/16/2020   PNEUMOCOCCAL CONJUGATE-20 07/09/2021   PPD Test 07/18/2012, 04/23/2015, 06/17/2017   Pneumococcal Polysaccharide-23 09/07/2006, 12/27/2012, 01/25/2015, 12/19/2015   Td 01/31/2004   Tdap 07/26/2015   Zoster, Live 12/15/2016   Qualifies for Shingles Vaccine? Yes   Zostavax completed No   Shingrix Completed?: No.    Education has been provided regarding the importance of this vaccine. Patient has been advised to call insurance company to determine out of pocket expense if they have not yet received this vaccine. Advised may also receive vaccine at local  pharmacy or Health Dept. Verbalized acceptance and understanding.  Screening Tests Health Maintenance  Topic Date Due   Zoster Vaccines- Shingrix (1 of 2) Never done   DEXA SCAN  Never done   PNA vac Low Risk Adult (1 of 2 - PCV13) 03/07/2021   OPHTHALMOLOGY EXAM  05/21/2021   COVID-19 Vaccine (5 - Booster for Pfizer series) 08/02/2021   HEMOGLOBIN A1C  11/21/2021   MAMMOGRAM  03/28/2022   FOOT EXAM  05/21/2022   COLONOSCOPY (Pts 45-38yr Insurance coverage will need to be confirmed)  01/20/2025   TETANUS/TDAP  07/25/2025   INFLUENZA VACCINE  Completed   Hepatitis C Screening  Completed   HIV Screening  Completed   HPV VACCINES  Aged Out   Cancer Screenings: Lung: Low Dose CT Chest recommended if Age 890-80  years, 30 pack-year currently smoking OR have quit w/in 15years. Patient does not qualify. Breast:  Up to date on Mammogram? Yes   Up to date of Bone Density/Dexa? Order placed on 07/09/2021 Colorectal: UTD  Additional Screenings: Hepatitis C Screening: Completed  HIV Screening: Completed   Plan:  Schedule your DEXA scan. You are due for your eye exam. You are eligible for the NEW covid booster. You can get this at your pharmacy.  Followup with PCP as instructed or as needed.   I have personally reviewed and noted the following in the patient's chart:   Medical and social history Use of alcohol, tobacco or illicit drugs  Current medications and supplements Functional ability and status Nutritional status Physical activity Advanced directives List of other physicians Hospitalizations, surgeries, and ER visits in previous 12 months Vitals Screenings to include cognitive, depression, and falls Referrals and appointments  In addition, I have reviewed and discussed with patient certain preventive protocols, quality metrics, and best practice recommendations. A written personalized care plan for preventive services as well as general preventive health recommendations  were provided to patient.  This visit was conducted virtually in the setting of the [place] pandemic.    Dorna Bloom, [place]  07/10/2021

## 2021-07-10 NOTE — Patient Instructions (Addendum)
You spoke to [name], [name] over the phone for your annual wellness visit.  We discussed goals:   Goals      Blood Pressure < 140/90     HEMOGLOBIN A1C < 7.0     Weight < 175 lb (79.379 kg)       We also discussed recommended health maintenance. As discussed, you are due for: Health Maintenance  Topic Date Due   Zoster Vaccines- Shingrix (1 of 2) Never done   DEXA SCAN  Never done   PNA vac Low Risk Adult (1 of 2 - PCV13) 03/07/2021   OPHTHALMOLOGY EXAM  05/21/2021   COVID-19 Vaccine (5 - Booster for Pfizer series) 08/02/2021   HEMOGLOBIN A1C  11/21/2021   MAMMOGRAM  03/28/2022   FOOT EXAM  05/21/2022   COLONOSCOPY (Pts 45-4yr Insurance coverage will need to be confirmed)  01/20/2025   TETANUS/TDAP  07/25/2025   INFLUENZA VACCINE  Completed   Hepatitis C Screening  Completed   HIV Screening  Completed   HPV VACCINES  Aged Out   Schedule your DEXA scan. You are due for your eye exam. You are eligible for the NEW covid booster. You can get this at your pharmacy.  Followup with PCP as instructed or as needed.   Preventive Care [age]Years Old, Female Preventive care refers to lifestyle choices and visits with your health care provider that can promote health and wellness. This includes: A yearly physical exam. This is also called an annual wellness visit. Regular dental and eye exams. Immunizations. Screening for certain conditions. Healthy lifestyle choices, such as: Eating a healthy diet. Getting regular exercise. Not using drugs or products that contain nicotine and tobacco. Limiting alcohol use. What can I expect for my preventive care visit? Physical exam Your health care provider will check your: Height and weight. These may be used to calculate your BMI (body mass index). BMI is a measurement that tells if you are at a healthy weight. Heart rate and blood pressure. Body temperature. Skin for abnormal spots. Counseling Your health care provider may ask  you questions about your: Past medical problems. Family's medical history. Alcohol, tobacco, and drug use. Emotional well-being. Home life and relationship well-being. Sexual activity. Diet, exercise, and sleep habits. Work and work [employer] Access to firearms. Method of birth control. Menstrual cycle. Pregnancy history. What immunizations do I need? Vaccines are usually given at various ages, according to a schedule. Your health care provider will recommend vaccines for you based on your age, medical history, and lifestyle or other factors, such as travel or where you work. What tests do I need? Blood tests Lipid and cholesterol levels. These may be checked every 5 years, or more often if you are over 65years old. Hepatitis C test. Hepatitis B test. Screening Lung cancer screening. You may have this screening every year starting at age 65if you have a 30-pack-year history of smoking and currently smoke or have quit within the past 15 years. Colorectal cancer screening. All adults should have this screening starting at age 65and continuing until age 65 Your health care provider may recommend screening at age 8152if you are at increased risk. You will have tests every 1-10 years, depending on your results and the type of screening test. Diabetes screening. This is done by checking your blood sugar (glucose) after you have not eaten for a while (fasting). You may have this done every 1-3 years. Mammogram. This may be done every 1-2  years. Talk with your health care provider about when you should start having regular mammograms. This may depend on whether you have a family history of breast cancer. BRCA-related cancer screening. This may be done if you have a family history of breast, ovarian, tubal, or peritoneal cancers. Pelvic exam and Pap test. This may be done every 3 years starting at age 65. Starting at age 65, this may be done every 5 years if you have a Pap test in  combination with an HPV test. Other tests STD (sexually transmitted disease) testing, if you are at risk. Bone density scan. This is done to screen for osteoporosis. You may have this scan if you are at high risk for osteoporosis. Talk with your health care provider about your test results, treatment options, and if necessary, the need for more tests. Follow these instructions at home: Eating and drinking  Eat a diet that includes fresh fruits and vegetables, whole grains, lean protein, and low-fat dairy products. Take vitamin and mineral supplements as recommended by your health care provider. Do not drink alcohol if: Your health care provider tells you not to drink. You are pregnant, may be pregnant, or are planning to become pregnant. If you drink alcohol: Limit how much you have to 0-1 drink a day. Be aware of how much alcohol is in your drink. In the [place] one drink equals one 12 oz bottle of beer (355 mL), one 5 oz glass of wine (148 mL), or one 1 oz glass of hard liquor (44 mL). Lifestyle Take daily care of your teeth and gums. Brush your teeth every morning and night with fluoride toothpaste. Floss one time each day. Stay active. Exercise for at least 30 minutes 5 or more days each week. Do not use any products that contain nicotine or tobacco, such as cigarettes, e-cigarettes, and chewing tobacco. If you need help quitting, ask your health care provider. Do not use drugs. If you are sexually active, practice safe sex. Use a condom or other form of protection to prevent STIs (sexually transmitted infections). If you do not wish to become pregnant, use a form of birth control. If you plan to become pregnant, see your health care provider for a prepregnancy visit. If told by your health care provider, take low-dose aspirin daily starting at age 10. Find healthy ways to cope with stress, such as: Meditation, yoga, or listening to music. Journaling. Talking to a trusted  person. Spending time with friends and family. Safety Always wear your seat belt while driving or riding in a vehicle. Do not drive: If you have been drinking alcohol. Do not ride with someone who has been drinking. When you are tired or distracted. While texting. Wear a helmet and other protective equipment during sports activities. If you have firearms in your house, make sure you follow all gun safety procedures. What's next? Visit your health care provider once a year for an annual wellness visit. Ask your health care provider how often you should have your eyes and teeth checked. Stay up to date on all vaccines. This information is not intended to replace advice given to you by your health care provider. Make sure you discuss any questions you have with your health care provider. Document Revised: 12/26/2020 Document Reviewed: 06/29/2018 Elsevier Patient Education  2022 [place] Prevention in the Home, Adult Falls can cause injuries and can happen to people of all ages. There are many things you can do to make your home safe  and to help prevent falls. Ask for help when making these changes. What actions can I take to prevent falls? General Instructions Use good lighting in all rooms. Replace any light bulbs that burn out. Turn on the lights in dark areas. Use night-lights. Keep items that you use often in easy-to-reach places. Lower the shelves around your home if needed. Set up your furniture so you have a clear path. Avoid moving your furniture around. Do not have throw rugs or other things on the floor that can make you trip. Avoid walking on wet floors. If any of your floors are uneven, fix them. Add color or contrast paint or tape to clearly mark and help you see: Grab bars or handrails. First and last steps of staircases. Where the edge of each step is. If you use a stepladder: Make sure that it is fully opened. Do not climb a closed stepladder. Make sure the  sides of the stepladder are locked in place. Ask someone to hold the stepladder while you use it. Know where your pets are when moving through your home. What can I do in the bathroom?   Keep the floor dry. Clean up any water on the floor right away. Remove soap buildup in the tub or shower. Use nonskid mats or decals on the floor of the tub or shower. Attach bath mats securely with double-sided, nonslip rug tape. If you need to sit down in the shower, use a plastic, nonslip stool. Install grab bars by the toilet and in the tub and shower. Do not use towel bars as grab bars. What can I do in the bedroom? Make sure that you have a light by your bed that is easy to reach. Do not use any sheets or blankets for your bed that hang to the floor. Have a firm chair with side arms that you can use for support when you get dressed. What can I do in the kitchen? Clean up any spills right away. If you need to reach something above you, use a step stool with a grab bar. Keep electrical cords out of the way. Do not use floor polish or wax that makes floors slippery. What can I do with my stairs? Do not leave any items on the stairs. Make sure that you have a light switch at the top and the bottom of the stairs. Make sure that there are handrails on both sides of the stairs. Fix handrails that are broken or loose. Install nonslip stair treads on all your stairs. Avoid having throw rugs at the top or bottom of the stairs. Choose a carpet that does not hide the edge of the steps on the stairs. Check carpeting to make sure that it is firmly attached to the stairs. Fix carpet that is loose or worn. What can I do on the outside of my home? Use bright outdoor lighting. Fix the edges of walkways and driveways and fix any cracks. Remove anything that might make you trip as you walk through a door, such as a raised step or threshold. Trim any bushes or trees on paths to your home. Check to see if handrails  are loose or broken and that both sides of all steps have handrails. Install guardrails along the edges of any raised decks and porches. Clear paths of anything that can make you trip, such as tools or rocks. Have leaves, snow, or ice cleared regularly. Use sand or salt on paths during winter. Clean up any spills in your  garage right away. This includes grease or oil spills. What other actions can I take? Wear shoes that: Have a low heel. Do not wear high heels. Have rubber bottoms. Feel good on your feet and fit well. Are closed at the toe. Do not wear open-toe sandals. Use tools that help you move around if needed. These include: Canes. Walkers. Scooters. Crutches. Review your medicines with your doctor. Some medicines can make you feel dizzy. This can increase your chance of falling. Ask your doctor what else you can do to help prevent falls. Where to find more information Centers for Disease Control and Prevention, STEADI: http://www.wolf.info/ National Institute on Aging: http://kim-miller.com/ Contact a doctor if: You are afraid of falling at home. You feel weak, drowsy, or dizzy at home. You fall at home. Summary There are many simple things that you can do to make your home safe and to help prevent falls. Ways to make your home safe include removing things that can make you trip and installing grab bars in the bathroom. Ask for help when making these changes in your home. This information is not intended to replace advice given to you by your health care provider. Make sure you discuss any questions you have with your health care provider. Document Revised: 05/21/2020 Document Reviewed: 05/21/2020 Elsevier Patient Education  2022 [employer].   Our clinic's number is [phone]. Please call with questions or concerns about what we discussed today.

## 2021-07-13 NOTE — Progress Notes (Signed)
I have reviewed this visit and agree with the documentation.   

## 2021-07-23 ENCOUNTER — Other Ambulatory Visit: Payer: Medicare HMO

## 2021-08-11 DIAGNOSIS — Z961 Presence of intraocular lens: Secondary | ICD-10-CM | POA: Diagnosis not present

## 2021-08-11 DIAGNOSIS — E119 Type 2 diabetes mellitus without complications: Secondary | ICD-10-CM | POA: Diagnosis not present

## 2021-08-11 DIAGNOSIS — H04123 Dry eye syndrome of bilateral lacrimal glands: Secondary | ICD-10-CM | POA: Diagnosis not present

## 2021-08-27 ENCOUNTER — Ambulatory Visit (INDEPENDENT_AMBULATORY_CARE_PROVIDER_SITE_OTHER): Payer: Medicare HMO | Admitting: Family Medicine

## 2021-08-27 ENCOUNTER — Other Ambulatory Visit: Payer: Self-pay

## 2021-08-27 VITALS — BP 123/68 | HR 84 | Temp 99.0°F

## 2021-08-27 DIAGNOSIS — R059 Cough, unspecified: Secondary | ICD-10-CM

## 2021-08-27 DIAGNOSIS — J069 Acute upper respiratory infection, unspecified: Secondary | ICD-10-CM | POA: Diagnosis not present

## 2021-08-27 MED ORDER — BENZONATATE 100 MG PO CAPS: 100.0000 mg | ORAL_CAPSULE | Freq: Two times a day (BID) | ORAL | 0 refills | Status: DC | PRN

## 2021-08-27 NOTE — Patient Instructions (Signed)
It was wonderful to meet you today. Thank you for allowing me to be a part of your care. 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   For cough: - tessalon pearls - cough drops - honey  For mucous: - guaifenesin or mucinex  - will be labelled as "cough expectorant" - these will help you cough up the phlegm out of your chest   When to come back: You don't bounce back after a couple of days You continue to worsen You develop fever, chills, muscle aches You get so short of breath you can't walk across the house to do your normal every day tasks   Please bring all of your medications to every appointment!  If you have any questions or concerns, please do not hesitate to contact us via phone or MyChart message.   Ezequiel Essex, MD

## 2021-08-27 NOTE — Progress Notes (Signed)
covid

## 2021-08-28 LAB — SARS-COV-2, NAA 2 DAY TAT

## 2021-08-28 LAB — NOVEL CORONAVIRUS, NAA: SARS-CoV-2, NAA: NOT DETECTED

## 2021-09-01 ENCOUNTER — Ambulatory Visit (INDEPENDENT_AMBULATORY_CARE_PROVIDER_SITE_OTHER): Payer: Medicare HMO | Admitting: Podiatry

## 2021-09-01 ENCOUNTER — Other Ambulatory Visit: Payer: Self-pay

## 2021-09-01 DIAGNOSIS — M2042 Other hammer toe(s) (acquired), left foot: Secondary | ICD-10-CM | POA: Diagnosis not present

## 2021-09-01 DIAGNOSIS — M2012 Hallux valgus (acquired), left foot: Secondary | ICD-10-CM | POA: Diagnosis not present

## 2021-09-01 DIAGNOSIS — M2011 Hallux valgus (acquired), right foot: Secondary | ICD-10-CM | POA: Diagnosis not present

## 2021-09-01 DIAGNOSIS — M2041 Other hammer toe(s) (acquired), right foot: Secondary | ICD-10-CM | POA: Diagnosis not present

## 2021-09-01 DIAGNOSIS — E1151 Type 2 diabetes mellitus with diabetic peripheral angiopathy without gangrene: Secondary | ICD-10-CM

## 2021-09-01 DIAGNOSIS — B351 Tinea unguium: Secondary | ICD-10-CM

## 2021-09-01 DIAGNOSIS — E1169 Type 2 diabetes mellitus with other specified complication: Secondary | ICD-10-CM

## 2021-09-01 NOTE — Progress Notes (Signed)
Patient presents today for diabetic shoe pick up. Patient voices no new complaints.   Shoes were fitted to patient's feet. No discomfort and no rubbing. Patient satisfied with the diabetic shoes    Shoes were dispensed to patient with instructions for break in wear and to call the office if any concerns or questions.

## 2021-09-05 NOTE — Assessment & Plan Note (Signed)
Cough, congestion, and postnasal drip likely due to viral URI.  No red flag symptoms, still drinking and urinating normally.  COVID swab collected today.  Supportive care reviewed, see AVS for full details.  Return precautions discussed.  Work note provided.

## 2021-09-05 NOTE — Progress Notes (Signed)
    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 - Reports upper respiratory symptoms of cough, congestion, some shortness of breath - No fever, chills, muscle aches - Cough is nonproductive   PERTINENT  PMH / PSH:  Patient Active Problem List   Diagnosis Date Noted   BMI 40.0-44.9, adult ([place]) 05/22/2021   Dehydration with hypernatremia 05/22/2021   Osteoarthritis, bilateral hands/fingers 04/02/2021   Type 2 diabetes mellitus with other specified complication, without long-term current use of insulin ([place]) 05/05/2020   Venous insufficiency of both lower extremities, Right > Left leg 10/12/2019   Hx of gastrointestinal hemorrhage 05/18/2019   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   Esophageal dysmotility 02/14/2017   DDD (degenerative disc disease), lumbar 04/14/2016   Osteoarthritis of both knees 03/12/2016   Hypokalemia 03/18/2015   History of stroke 02/13/2014   Hyperlipidemia associated with type 2 diabetes mellitus ([place]) 02/13/2014   Viral URI with cough 09/17/2013   Insomnia 08/29/2012   GERD (gastroesophageal reflux disease) 08/24/2012   Asthma, cough variant 03/10/2012   Hypertension associated with diabetes ([place]) 01/05/2012   Allergic rhinitis 05/27/2010   Morbid obesity ([place]) 12/29/2006   Venous (peripheral) insufficiency 12/29/2006     OBJECTIVE:   BP 123/68   Pulse 84   Temp 99 F (37.2 C) (Oral)   SpO2 99%    PHQ-9:  Depression screen [facility] 2/9 07/10/2021 07/09/2021 05/21/2021  Decreased Interest 0 0 -  Down, Depressed, Hopeless 0 0 0  PHQ - 2 Score 0 0 0  Altered sleeping 0 0 2  Tired, decreased energy 0 0 2  Change in appetite 0 0 0  Feeling bad or failure about yourself  0 0 0  Trouble concentrating 0 0 0  Moving slowly or fidgety/restless 0 0 0  Suicidal thoughts 0 0 0  PHQ-9 Score 0 0 4  Difficult doing work/chores - Not difficult at all Not  difficult at all  Some recent data might be hidden     GAD-7: No flowsheet data found.  Physical Exam General: Awake, alert, oriented HEENT: PERRL, nasal mucosa slightly edematous, oral mucosa pink, moist, without lesion, intact dentition  Lymph: No palpable lymphedema of head or neck Cardiovascular: Regular rate and rhythm, S1 and S2 present, no murmurs auscultated Respiratory: Lung fields clear to auscultation bilaterally  ASSESSMENT/PLAN:   Viral URI with cough Cough, congestion, and postnasal drip likely due to viral URI.  No red flag symptoms, still drinking and urinating normally.  COVID swab collected today.  Supportive care reviewed, see AVS for full details.  Return precautions discussed.  Work note provided.     Ezequiel Essex, MD [place]

## 2021-09-08 ENCOUNTER — Ambulatory Visit (INDEPENDENT_AMBULATORY_CARE_PROVIDER_SITE_OTHER): Payer: Medicare HMO | Admitting: Podiatry

## 2021-09-08 DIAGNOSIS — Z91199 Patient's noncompliance with other medical treatment and regimen due to unspecified reason: Secondary | ICD-10-CM

## 2021-09-15 ENCOUNTER — Other Ambulatory Visit: Payer: Self-pay | Admitting: Family Medicine

## 2021-09-16 ENCOUNTER — Other Ambulatory Visit: Payer: Self-pay | Admitting: Family Medicine

## 2021-10-07 ENCOUNTER — Other Ambulatory Visit: Payer: Self-pay | Admitting: Family Medicine

## 2021-10-08 ENCOUNTER — Telehealth: Payer: Self-pay

## 2021-10-08 DIAGNOSIS — H029 Unspecified disorder of eyelid: Secondary | ICD-10-CM

## 2021-10-08 NOTE — Telephone Encounter (Signed)
Please ask patient to make appointment to discuss increasing dose

## 2021-10-08 NOTE — Telephone Encounter (Signed)
Patient calls nurse line requesting referral to [facility]. Patient reports that she has a spot under her eye that needs to be removed and needs referral placed.   Fax number for office is [phone].  Talbot Grumbling, RN

## 2021-10-09 NOTE — Telephone Encounter (Signed)
Office visit note 05/21/21 New concern Lesion located right lower medial eyelid margin May have increased in size.  Started itching. Present for several years   Blue-black rounded nodule Approximately 2 mm diameter located right lower eyelid.  Distinct border from normal surrounding lid margin.    Suspect some type of nevus, but givne pigmented nature and possible enlargment and itching, it does seem prudent to have excision for histopathologic evaluation to rule out melanoma.    Patient has already contacted [facility] who required a referral from PCP for consultation.  Referral fax'd   Patient requesting referral to [facility] for right lower eyelid margin pigmented lesion removal.  Referral ordered.

## 2021-11-14 ENCOUNTER — Other Ambulatory Visit: Payer: Self-pay | Admitting: Internal Medicine

## 2021-11-26 ENCOUNTER — Ambulatory Visit: Payer: Medicare HMO | Admitting: Family Medicine

## 2021-12-14 ENCOUNTER — Other Ambulatory Visit: Payer: Self-pay | Admitting: Family Medicine

## 2021-12-17 ENCOUNTER — Ambulatory Visit (INDEPENDENT_AMBULATORY_CARE_PROVIDER_SITE_OTHER): Payer: Medicare HMO | Admitting: Family Medicine

## 2021-12-17 ENCOUNTER — Other Ambulatory Visit: Payer: Self-pay

## 2021-12-17 ENCOUNTER — Encounter: Payer: Self-pay | Admitting: Family Medicine

## 2021-12-17 VITALS — BP 134/86 | HR 95 | Wt 215.0 lb

## 2021-12-17 DIAGNOSIS — L03012 Cellulitis of left finger: Secondary | ICD-10-CM | POA: Diagnosis not present

## 2021-12-17 DIAGNOSIS — E1169 Type 2 diabetes mellitus with other specified complication: Secondary | ICD-10-CM

## 2021-12-17 DIAGNOSIS — I152 Hypertension secondary to endocrine disorders: Secondary | ICD-10-CM

## 2021-12-17 DIAGNOSIS — Z23 Encounter for immunization: Secondary | ICD-10-CM

## 2021-12-17 DIAGNOSIS — E2839 Other primary ovarian failure: Secondary | ICD-10-CM

## 2021-12-17 DIAGNOSIS — Z6841 Body Mass Index (BMI) 40.0 and over, adult: Secondary | ICD-10-CM

## 2021-12-17 DIAGNOSIS — E1159 Type 2 diabetes mellitus with other circulatory complications: Secondary | ICD-10-CM

## 2021-12-17 MED ORDER — OZEMPIC (2 MG/DOSE) 8 MG/3ML ~~LOC~~ SOPN: 2.0000 mg | PEN_INJECTOR | SUBCUTANEOUS | 5 refills | Status: DC

## 2021-12-17 MED ORDER — ZOSTER VACCINE LIVE 19400 UNT/0.65ML ~~LOC~~ SUSR: 0.6500 mL | Freq: Once | SUBCUTANEOUS | 0 refills | Status: DC

## 2021-12-17 MED ORDER — ZOSTER VACCINE LIVE 19400 UNT/0.65ML ~~LOC~~ SUSR: 0.6500 mL | Freq: Once | SUBCUTANEOUS | 0 refills | Status: AC

## 2021-12-17 MED ORDER — MUPIROCIN 2 % EX OINT: TOPICAL_OINTMENT | Freq: Two times a day (BID) | CUTANEOUS | 0 refills | Status: AC

## 2021-12-17 NOTE — Patient Instructions (Addendum)
You are losing weight with the Ozempic injections.  I recommend increasing to the final full dose of 2 mg injected weekly.  A prescription for this new 2 mg injection pen was sent to [facility].   It is time to have your eyes examined again to make sure there is no changes from diabetes. Please schedule a visit with your optometrist at the [facility].   [facility] will be calling you in the next 5 business days to schedule an appointment for your bone density scan.  This test is to see if you are developing osteoporosis (fragile bones).

## 2021-12-18 ENCOUNTER — Encounter: Payer: Self-pay | Admitting: Family Medicine

## 2021-12-18 DIAGNOSIS — B351 Tinea unguium: Secondary | ICD-10-CM

## 2021-12-18 DIAGNOSIS — E1151 Type 2 diabetes mellitus with diabetic peripheral angiopathy without gangrene: Secondary | ICD-10-CM | POA: Insufficient documentation

## 2021-12-18 HISTORY — DX: Type 2 diabetes mellitus with diabetic peripheral angiopathy without gangrene: B35.1

## 2021-12-18 HISTORY — DX: Type 2 diabetes mellitus with other specified complication: E11.51

## 2021-12-18 NOTE — Assessment & Plan Note (Addendum)
Established problem Well Controlled. No signs of complications, medication side effects, or red flags. Anticipate further BP reduction with further weight reduction

## 2021-12-18 NOTE — Assessment & Plan Note (Signed)
Established problem that has improved.  Tolerating Ozempic 1 mg [place] weekly Wt Readings from Last 3 Encounters:  12/17/21 215 lb (97.5 kg)  07/09/21 231 lb 9.6 oz (105.1 kg)  06/10/21 238 lb 1.6 oz (108 kg)  Down 16 pounds since start of Ozempic  Plan Increase to Ozempic 2 mg [place] weekly  RTC 6 weeks for followuyp tolerance and response

## 2021-12-18 NOTE — Progress Notes (Signed)
[name] is alone Sources of clinical information for visit is/are patient. Nursing assessment for this office visit was reviewed with the patient for accuracy and revision.     Previous Report(s) Reviewed: none  Depression screen PHQ 2/9 12/17/2021  Decreased Interest 0  Down, Depressed, Hopeless 0  PHQ - 2 Score 0  Altered sleeping 0  Tired, decreased energy 1  Change in appetite 0  Feeling bad or failure about yourself  0  Trouble concentrating 0  Moving slowly or fidgety/restless 0  Suicidal thoughts 0  PHQ-9 Score 1  Difficult doing work/chores -  Some recent data might be hidden   [place] Visit from 12/17/2021 in [facility] from 07/10/2021 in [facility] Office Visit from 07/09/2021 in [facility]  Thoughts that you would be better off dead, or of hurting yourself in some way Not at all Not at all Not at all  PHQ-9 Total Score 1 0 0       Fall Risk  12/17/2021 07/10/2021 07/09/2021 05/21/2021 04/02/2021  Falls in the past year? 1 - 1 1 0  Number falls in past yr: 1 1 1 1  0  Injury with Fall? - 1 1 1  -  Risk for fall due to : History of fall(s) History of fall(s) - - -    PHQ9 SCORE ONLY 12/17/2021 07/10/2021 07/09/2021  PHQ-9 Total Score 1 0 0    Adult vaccines due  Topic Date Due   TETANUS/TDAP  07/25/2025    Health Maintenance Due  Topic Date Due   Zoster Vaccines- Shingrix (2 of 2) 02/09/2017   DEXA SCAN  Never done   OPHTHALMOLOGY EXAM  05/21/2021   COVID-19 Vaccine (5 - Booster for Pfizer series) 05/28/2021   HEMOGLOBIN A1C  11/21/2021      History/P.E. limitations: none  Adult vaccines due  Topic Date Due   TETANUS/TDAP  07/25/2025    Diabetes Health Maintenance Due  Topic Date Due   OPHTHALMOLOGY EXAM  05/21/2021   HEMOGLOBIN A1C  11/21/2021   FOOT EXAM  05/21/2022    Health Maintenance Due  Topic Date Due   Zoster Vaccines- Shingrix (2 of 2) 02/09/2017    DEXA SCAN  Never done   OPHTHALMOLOGY EXAM  05/21/2021   COVID-19 Vaccine (5 - Booster for [place] series) 05/28/2021   HEMOGLOBIN A1C  11/21/2021     Chief Complaint  Patient presents with   Nail Problem   Headache    X 3 weeks

## 2021-12-22 DIAGNOSIS — H57813 Brow ptosis, bilateral: Secondary | ICD-10-CM | POA: Diagnosis not present

## 2021-12-22 DIAGNOSIS — H02423 Myogenic ptosis of bilateral eyelids: Secondary | ICD-10-CM | POA: Diagnosis not present

## 2021-12-22 DIAGNOSIS — H02834 Dermatochalasis of left upper eyelid: Secondary | ICD-10-CM | POA: Diagnosis not present

## 2021-12-22 DIAGNOSIS — D485 Neoplasm of uncertain behavior of skin: Secondary | ICD-10-CM | POA: Diagnosis not present

## 2021-12-22 DIAGNOSIS — H02831 Dermatochalasis of right upper eyelid: Secondary | ICD-10-CM | POA: Diagnosis not present

## 2021-12-23 ENCOUNTER — Other Ambulatory Visit: Payer: Self-pay | Admitting: Family Medicine

## 2021-12-23 ENCOUNTER — Encounter: Payer: Self-pay | Admitting: Family Medicine

## 2021-12-23 DIAGNOSIS — H04123 Dry eye syndrome of bilateral lacrimal glands: Secondary | ICD-10-CM

## 2021-12-23 DIAGNOSIS — H209 Unspecified iridocyclitis: Secondary | ICD-10-CM

## 2021-12-23 HISTORY — DX: Dry eye syndrome of bilateral lacrimal glands: H04.123

## 2022-01-06 DIAGNOSIS — M25562 Pain in left knee: Secondary | ICD-10-CM | POA: Diagnosis not present

## 2022-01-13 LAB — HM DIABETES EYE EXAM

## 2022-01-29 ENCOUNTER — Other Ambulatory Visit: Payer: Self-pay

## 2022-01-29 ENCOUNTER — Emergency Department (HOSPITAL_COMMUNITY): Payer: Medicare HMO

## 2022-01-29 ENCOUNTER — Encounter (HOSPITAL_COMMUNITY): Payer: Self-pay

## 2022-01-29 ENCOUNTER — Emergency Department (HOSPITAL_COMMUNITY)
Admission: EM | Admit: 2022-01-29 | Discharge: 2022-01-29 | Disposition: A | Discharge status: Home / Self Care | Payer: Medicare HMO | Attending: Emergency Medicine | Admitting: Emergency Medicine

## 2022-01-29 DIAGNOSIS — W010XXA Fall on same level from slipping, tripping and stumbling without subsequent striking against object, initial encounter: Secondary | ICD-10-CM | POA: Insufficient documentation

## 2022-01-29 DIAGNOSIS — Y92210 Daycare center as the place of occurrence of the external cause: Secondary | ICD-10-CM | POA: Insufficient documentation

## 2022-01-29 DIAGNOSIS — M79603 Pain in arm, unspecified: Secondary | ICD-10-CM | POA: Diagnosis not present

## 2022-01-29 DIAGNOSIS — S43005A Unspecified dislocation of left shoulder joint, initial encounter: Secondary | ICD-10-CM | POA: Diagnosis not present

## 2022-01-29 DIAGNOSIS — S43006A Unspecified dislocation of unspecified shoulder joint, initial encounter: Secondary | ICD-10-CM

## 2022-01-29 DIAGNOSIS — G589 Mononeuropathy, unspecified: Secondary | ICD-10-CM

## 2022-01-29 DIAGNOSIS — Z7982 Long term (current) use of aspirin: Secondary | ICD-10-CM | POA: Diagnosis not present

## 2022-01-29 DIAGNOSIS — S43035A Inferior dislocation of left humerus, initial encounter: Secondary | ICD-10-CM | POA: Diagnosis not present

## 2022-01-29 DIAGNOSIS — S43002A Unspecified subluxation of left shoulder joint, initial encounter: Secondary | ICD-10-CM | POA: Diagnosis not present

## 2022-01-29 DIAGNOSIS — W19XXXA Unspecified fall, initial encounter: Secondary | ICD-10-CM | POA: Diagnosis not present

## 2022-01-29 DIAGNOSIS — S4992XA Unspecified injury of left shoulder and upper arm, initial encounter: Secondary | ICD-10-CM | POA: Diagnosis present

## 2022-01-29 DIAGNOSIS — M79629 Pain in unspecified upper arm: Secondary | ICD-10-CM | POA: Diagnosis not present

## 2022-01-29 DIAGNOSIS — M79622 Pain in left upper arm: Secondary | ICD-10-CM | POA: Diagnosis not present

## 2022-01-29 DIAGNOSIS — I10 Essential (primary) hypertension: Secondary | ICD-10-CM | POA: Diagnosis not present

## 2022-01-29 DIAGNOSIS — S43015A Anterior dislocation of left humerus, initial encounter: Secondary | ICD-10-CM | POA: Diagnosis not present

## 2022-01-29 HISTORY — DX: Unspecified dislocation of unspecified shoulder joint, initial encounter: S43.006A

## 2022-01-29 HISTORY — DX: Mononeuropathy, unspecified: G58.9

## 2022-01-29 MED ORDER — LIDOCAINE-EPINEPHRINE (PF) 2 %-1:200000 IJ SOLN
10.0000 mL | Freq: Once | INTRAMUSCULAR | Status: AC
  Administered 2022-01-29: 10 mL via INTRADERMAL
  Filled 2022-01-29: qty 20

## 2022-01-29 MED ORDER — ONDANSETRON HCL 4 MG/2ML IJ SOLN
4.0000 mg | Freq: Once | INTRAMUSCULAR | Status: AC
  Administered 2022-01-29: 4 mg via INTRAVENOUS
  Filled 2022-01-29: qty 2

## 2022-01-29 MED ORDER — HYDROMORPHONE HCL 1 MG/ML IJ SOLN
1.0000 mg | Freq: Once | INTRAMUSCULAR | Status: AC
  Administered 2022-01-29: 1 mg via INTRAVENOUS
  Filled 2022-01-29: qty 1

## 2022-01-29 MED ORDER — FENTANYL CITRATE PF 50 MCG/ML IJ SOSY
150.0000 ug | PREFILLED_SYRINGE | Freq: Once | INTRAMUSCULAR | Status: AC
  Administered 2022-01-29: 150 ug via INTRAVENOUS
  Filled 2022-01-29: qty 3

## 2022-01-29 NOTE — ED Provider Notes (Signed)
3:22 PM ?Care assumed from Dr. Tyrone Nine.  At time of transfer of care, patient is awaiting for orthopedic recommendations for follow-up given the reported left wrist weakness after successful shoulder reduction in the emergency department. ? ?  ?Orthopedics communicated they wanted arm immobilization and follow-up in clinic with Dr. Percell Miller.  Due to the wrist drop we will give a wrist brace and then the shoulder immobilizer.  She will follow-up and she agrees with plan.  Patient discharged in good condition. ? ? ?Clinical Impression: ?1. Shoulder dislocation, left, initial encounter   ?2. Nerve palsy   ? ? ?Disposition: Discharge ? ?Condition: Good ? ?I have discussed the results, Dx and Tx plan with the pt(& family if present). He/she/they expressed understanding and agree(s) with the plan. Discharge instructions discussed at great length. Strict return precautions discussed and pt &/or family have verbalized understanding of the instructions. No further questions at time of discharge.  ? ? ?Discharge Medication List as of 01/29/2022  5:14 PM  ?  ? ? ?Follow Up: ?Renette Butters, MD ?[address] ?[address] ?[place] [address] ?[phone] ? ? ?with ortho ? ? ?  ?Alaija Ruble, Gwenyth Allegra, MD ?01/29/22 1953 ? ?

## 2022-01-29 NOTE — ED Notes (Signed)
Ortho Tech applied arm sling and shoulder stabilizer. ?

## 2022-01-29 NOTE — ED Provider Notes (Signed)
?[facility] ?Provider Note ? ? ?CSN: [id number] ?Arrival date & time: 01/29/22  1313 ? ?  ? ?History ? ?Chief Complaint  ?Patient presents with  ? Fall  ? Arm Injury  ? ? ?[name] is a 66 y.o. female. ? ?66 yo F with a cc of left arm pain.  Works in a day care, was walking during nap time, tripped over a mattress.  Landed on outstretched arms.  Pain to L arm.  Diffusely about the upper arm.  Goes down to the elbow.   ? ? ?Fall ? ?Arm Injury ? ?  ? ?Home Medications ?Prior to Admission medications   ?Medication Sig Start Date End Date Taking? Authorizing Provider  ?ascorbic acid (VITAMIN C) 250 MG tablet Take by mouth.    [provider]  ?aspirin 81 MG chewable tablet Chew 81 mg by mouth daily.    [provider]  ?atorvastatin (LIPITOR) 40 MG tablet Take 1 tablet (40 mg total) by mouth daily at 6 PM. TAKE ONE TABLET DAILY 07/26/19 10/24/19  McDiarmid, Blane Ohara, MD  ?calcium carbonate (OS-CAL) 600 MG TABS tablet Take 1 tablet (600 mg total) by mouth 2 (two) times daily with a meal. 01/07/14   Funches, Adriana Mccallum, MD  ?cetirizine (ZYRTEC) 10 MG tablet Take 1 tablet (10 mg total) by mouth daily. ?Patient taking differently: Take 10 mg by mouth daily as needed for allergies. 05/19/16   Mercy Riding, MD  ?docusate sodium (COLACE) 100 MG capsule TAKE ONE CAPSULE TWICE A DAY 11/28/20   McDiarmid, Blane Ohara, MD  ?furosemide (LASIX) 40 MG tablet TAKE ONE TABLET EACH DAY 09/17/21   McDiarmid, Blane Ohara, MD  ?hydrochlorothiazide (MICROZIDE) 12.5 MG capsule TAKE ONE CAPSULE EACH DAY 01/22/21   McDiarmid, Blane Ohara, MD  ?IRON SUPPLEMENT 220 (44 Fe) MG/5ML solution TAKE 19m ('220mg'$  TOTAL) BY MOUTH DAILY WITH BREAKFAST 03/24/21   McDiarmid, TBlane Ohara MD  ?Multiple Vitamin (MULTIVITAMIN) tablet Take 1 tablet by mouth daily. 01/07/14   Funches, JAdriana Mccallum MD  ?olmesartan (BENICAR) 20 MG tablet Take 1 tablet (20 mg total) by mouth at bedtime. 07/09/21   McDiarmid, TBlane Ohara MD  ?omeprazole (PRILOSEC)  40 MG capsule TAKE ONE CAPSULE EACH DAY BEFORE BREAKFAST -OPEN CAPSULE AND PLACE IN LIQUID IF POSSIBLE 11/16/21   GGatha Mayer MD  ?potassium chloride SA (KLOR-CON) 20 MEQ tablet Take 2 tablets (40 mEq total) by mouth daily. 07/09/21   McDiarmid, TBlane Ohara MD  ?Semaglutide, 2 MG/DOSE, (OZEMPIC, 2 MG/DOSE,) 8 MG/3ML SOPN Inject 2 mg into the skin once a week. 12/17/21   McDiarmid, TBlane Ohara MD  ?sucralfate (CARAFATE) 1 GM/10ML suspension TAKE 146m(2 TEASPOONSFUL) BY MOUTH 4 TIMES DAILY WITH MEALS AND AT BEDTIME 12/15/21   McDiarmid, ToBlane OharaMD  ?vitamin B-12 (CYANOCOBALAMIN) 1000 MCG tablet Take 1,000 mcg by mouth daily.    [provider]  ?vitamin C (ASCORBIC ACID) 250 MG tablet Take 250 mg by mouth daily.    [provider]  ?vitamin E 180 MG (400 UNITS) capsule Take by mouth.    [provider]  ?Vitamins A & D 5000-400 units CAPS Take 5,000 Units by mouth daily. 03/16/16   [provider]  ?zolpidem (AMBIEN) 5 MG tablet TAKE ONE TABLET AT NIGHT AS NEEDED FOR SLEEP 07/13/21   McDiarmid, ToBlane OharaMD  ?   ? ?Allergies    ?Aspirin, Pantoprazole sodium, Nsaids, and Tolmetin   ? ?Review of Systems   ?  Review of Systems ? ?Physical Exam ?Updated Vital Signs ?BP (!) 203/110   Pulse 92   Temp (!) 97.5 ?F (36.4 ?C) (Oral)   Resp (!) 28   Ht '5\' 3"'$  (1.6 m)   Wt 90.7 kg   SpO2 97%   BMI 35.43 kg/m?  ?Physical Exam ?Vitals and nursing note reviewed.  ?Constitutional:   ?   General: She is not in acute distress. ?   Appearance: She is well-developed. She is not diaphoretic.  ?HENT:  ?   Head: Normocephalic and atraumatic.  ?Eyes:  ?   Pupils: Pupils are equal, round, and reactive to light.  ?Cardiovascular:  ?   Rate and Rhythm: Normal rate and regular rhythm.  ?   Heart sounds: No murmur heard. ?  No friction rub. No gallop.  ?Pulmonary:  ?   Effort: Pulmonary effort is normal.  ?   Breath sounds: No wheezing or rales.  ?Abdominal:  ?   General: There is no distension.  ?   Palpations:  Abdomen is soft.  ?   Tenderness: There is no abdominal tenderness.  ?Musculoskeletal:     ?   General: Tenderness present.  ?   Cervical back: Normal range of motion and neck supple.  ?   Comments: Diffuse pain about the left arm.  No obvious pain to the distal arm.  Able to supinate and pronate the forearm.  Able to move the elbow without obvious discomfort.  Significant pain to the midshaft of the humerus.  Pulse intact distally.  Motor limited likely secondary to pain.  ?Skin: ?   General: Skin is warm and dry.  ?Neurological:  ?   Mental Status: She is alert and oriented to person, place, and time.  ?Psychiatric:     ?   Behavior: Behavior normal.  ? ? ?ED Results / Procedures / Treatments   ?Labs ?(all labs ordered are listed, but only abnormal results are displayed) ?Labs Reviewed - No data to display ? ?EKG ?None ? ?Radiology ?DG Shoulder Left ? ?Result Date: 01/29/2022 ?CLINICAL DATA:  Trauma, fall EXAM: LEFT SHOULDER - 2+ VIEW COMPARISON:  None. FINDINGS: There is anterior inferior dislocation of humeral head in relation to glenoid. No fracture line is seen. Repeat images after reduction should be considered. IMPRESSION: Anterior inferior dislocation of right humeral head in relation to glenoid. Electronically Signed   By: Elmer Picker M.D.   On: 01/29/2022 13:59  ? ?DG Shoulder Left Port ? ?Result Date: 01/29/2022 ?CLINICAL DATA:  Status post reduction of left shoulder dislocation. EXAM: LEFT SHOULDER COMPARISON:  Same day. FINDINGS: There is been successful reduction of dislocation involving left glenohumeral joint. No fracture is noted. IMPRESSION: Successful reduction of dislocation involving left glenohumeral joint. Electronically Signed   By: Marijo Conception M.D.   On: 01/29/2022 15:05  ? ?DG Humerus Left ? ?Result Date: 01/29/2022 ?CLINICAL DATA:  Trauma, fall EXAM: LEFT HUMERUS - 2+ VIEW COMPARISON:  None. FINDINGS: There is anterior inferior dislocation of right humeral head in relation to  glenoid. No definite fracture lines are seen. In 1 of the lateral views, there is questionable minimal cortical irregularity in the posterior margin of supracondylar portion of distal humerus. This finding is not optimally evaluated. IMPRESSION: Anterior inferior dislocation of right humerus in relation to glenoid. There is questionable cortical irregularity in the distal humerus seen in the single view. Evaluation is limited due to less than optimal positioning. When the patient's clinical condition permits, routine radiographs  of left elbow may be considered for further evaluation. Electronically Signed   By: Elmer Picker M.D.   On: 01/29/2022 13:58   ? ?Procedures ?Reduction of dislocation ? ?Date/Time: 01/29/2022 2:42 PM ?Performed by: Deno Etienne, DO ?Authorized by: Deno Etienne, DO  ?Consent: Verbal consent not obtained. ?Risks and benefits: risks, benefits and alternatives were discussed ?Consent given by: patient ?Patient understanding: patient does not state understanding of the procedure being performed ?Imaging studies: imaging studies available ?Required items: required blood products, implants, devices, and special equipment available ?Patient identity confirmed: verbally with patient ?Time out: Immediately prior to procedure a "time out" was called to verify the correct patient, procedure, equipment, support staff and site/side marked as required. ?Local anesthesia used: yes ? ?Anesthesia: ?Local anesthesia used: yes ?Local Anesthetic: lidocaine 2% with epinephrine ?Anesthetic total: 20 mL ? ?Sedation: ?Patient sedated: no ? ?Patient tolerance: patient tolerated the procedure well with no immediate complications ? ?  ? ? ?Medications Ordered in ED ?Medications  ?lidocaine-EPINEPHrine (XYLOCAINE W/EPI) 2 %-1:200000 (PF) injection 10 mL (has no administration in time range)  ?HYDROmorphone (DILAUDID) injection 1 mg (1 mg Intravenous Given 01/29/22 1326)  ?ondansetron (ZOFRAN) injection 4 mg (4 mg  Intravenous Given 01/29/22 1327)  ?fentaNYL (SUBLIMAZE) injection 150 mcg (150 mcg Intravenous Given 01/29/22 1431)  ? ? ?ED Course/ Medical Decision Making/ A&P ?  ?                        ?Medical Decision Making ?Amount

## 2022-01-29 NOTE — ED Notes (Signed)
Pt tripped over some sleeping mats at work. Pt attempted to brace her fall with her arms. Pt hit her head and denies LOC, blood thinners, or spinal injury. Pt has left humerus deformity. Pt states she can feel sensation in her arms.  Pt received 100 mcg Fentanyl en route.  ? ?Pt states she did take her HTN medication today.  ? ?BP 214/128 ?HR 88 ?RR 24 ?RA 98%  ?

## 2022-01-29 NOTE — ED Notes (Signed)
Patient transported to X-ray 

## 2022-01-29 NOTE — Progress Notes (Signed)
Orthopedic Tech Progress Note ?Patient Details:  ?[name] ?07/12/1956 ?[id number] ? ?Ortho Devices ?Type of Ortho Device: Velcro wrist splint, Sling immobilizer ?Ortho Device/Splint Location: lue ?Ortho Device/Splint Interventions: Ordered, Application, Adjustment ?  ?Post Interventions ?Patient Tolerated: Well ? ?Edwina Barth ?01/29/2022, 5:44 PM ? ?

## 2022-01-29 NOTE — ED Triage Notes (Signed)
Pt bib ems from work c/o fall with possible left arm fracture. ?

## 2022-01-29 NOTE — Discharge Instructions (Signed)
Please use the immobilizers and follow up with orthopedics. Please rest and stay hydrated. If symptoms worsen, please return to nearest ED.  ? ?

## 2022-02-01 DIAGNOSIS — M25532 Pain in left wrist: Secondary | ICD-10-CM | POA: Diagnosis not present

## 2022-02-03 ENCOUNTER — Encounter: Payer: Self-pay | Admitting: Family Medicine

## 2022-02-05 DIAGNOSIS — M25512 Pain in left shoulder: Secondary | ICD-10-CM | POA: Diagnosis not present

## 2022-02-05 DIAGNOSIS — M542 Cervicalgia: Secondary | ICD-10-CM | POA: Diagnosis not present

## 2022-02-09 ENCOUNTER — Other Ambulatory Visit: Payer: Self-pay

## 2022-02-09 DIAGNOSIS — D22112 Melanocytic nevi of right lower eyelid, including canthus: Secondary | ICD-10-CM | POA: Diagnosis not present

## 2022-02-09 DIAGNOSIS — H02834 Dermatochalasis of left upper eyelid: Secondary | ICD-10-CM | POA: Diagnosis not present

## 2022-02-09 DIAGNOSIS — H57813 Brow ptosis, bilateral: Secondary | ICD-10-CM | POA: Diagnosis not present

## 2022-02-09 DIAGNOSIS — H02831 Dermatochalasis of right upper eyelid: Secondary | ICD-10-CM | POA: Diagnosis not present

## 2022-02-09 DIAGNOSIS — D485 Neoplasm of uncertain behavior of skin: Secondary | ICD-10-CM | POA: Diagnosis not present

## 2022-02-09 DIAGNOSIS — H02423 Myogenic ptosis of bilateral eyelids: Secondary | ICD-10-CM | POA: Diagnosis not present

## 2022-02-09 MED ORDER — FUROSEMIDE 40 MG PO TABS: ORAL_TABLET | ORAL | 2 refills | Status: DC

## 2022-02-09 MED ORDER — DOCUSATE SODIUM 100 MG PO CAPS: 100.0000 mg | ORAL_CAPSULE | Freq: Two times a day (BID) | ORAL | 3 refills | Status: DC

## 2022-02-11 ENCOUNTER — Ambulatory Visit: Payer: Medicare HMO | Admitting: Family Medicine

## 2022-02-15 DIAGNOSIS — M25512 Pain in left shoulder: Secondary | ICD-10-CM | POA: Diagnosis not present

## 2022-02-24 DIAGNOSIS — M6281 Muscle weakness (generalized): Secondary | ICD-10-CM | POA: Diagnosis not present

## 2022-02-24 DIAGNOSIS — M25612 Stiffness of left shoulder, not elsewhere classified: Secondary | ICD-10-CM | POA: Diagnosis not present

## 2022-02-24 DIAGNOSIS — S43015D Anterior dislocation of left humerus, subsequent encounter: Secondary | ICD-10-CM | POA: Diagnosis not present

## 2022-02-24 DIAGNOSIS — S46012D Strain of muscle(s) and tendon(s) of the rotator cuff of left shoulder, subsequent encounter: Secondary | ICD-10-CM | POA: Diagnosis not present

## 2022-02-25 ENCOUNTER — Ambulatory Visit: Payer: Medicare HMO | Admitting: Family Medicine

## 2022-03-01 DIAGNOSIS — S43015D Anterior dislocation of left humerus, subsequent encounter: Secondary | ICD-10-CM | POA: Diagnosis not present

## 2022-03-01 DIAGNOSIS — S46012D Strain of muscle(s) and tendon(s) of the rotator cuff of left shoulder, subsequent encounter: Secondary | ICD-10-CM | POA: Diagnosis not present

## 2022-03-01 DIAGNOSIS — M6281 Muscle weakness (generalized): Secondary | ICD-10-CM | POA: Diagnosis not present

## 2022-03-01 DIAGNOSIS — M25612 Stiffness of left shoulder, not elsewhere classified: Secondary | ICD-10-CM | POA: Diagnosis not present

## 2022-03-02 DIAGNOSIS — S46012D Strain of muscle(s) and tendon(s) of the rotator cuff of left shoulder, subsequent encounter: Secondary | ICD-10-CM | POA: Diagnosis not present

## 2022-03-02 DIAGNOSIS — M6281 Muscle weakness (generalized): Secondary | ICD-10-CM | POA: Diagnosis not present

## 2022-03-02 DIAGNOSIS — M25612 Stiffness of left shoulder, not elsewhere classified: Secondary | ICD-10-CM | POA: Diagnosis not present

## 2022-03-02 DIAGNOSIS — S43015D Anterior dislocation of left humerus, subsequent encounter: Secondary | ICD-10-CM | POA: Diagnosis not present

## 2022-03-06 ENCOUNTER — Other Ambulatory Visit: Payer: Self-pay | Admitting: Family Medicine

## 2022-03-08 DIAGNOSIS — S46012D Strain of muscle(s) and tendon(s) of the rotator cuff of left shoulder, subsequent encounter: Secondary | ICD-10-CM | POA: Diagnosis not present

## 2022-03-08 DIAGNOSIS — S43015D Anterior dislocation of left humerus, subsequent encounter: Secondary | ICD-10-CM | POA: Diagnosis not present

## 2022-03-08 DIAGNOSIS — M25612 Stiffness of left shoulder, not elsewhere classified: Secondary | ICD-10-CM | POA: Diagnosis not present

## 2022-03-08 DIAGNOSIS — M6281 Muscle weakness (generalized): Secondary | ICD-10-CM | POA: Diagnosis not present

## 2022-03-10 DIAGNOSIS — S46012D Strain of muscle(s) and tendon(s) of the rotator cuff of left shoulder, subsequent encounter: Secondary | ICD-10-CM | POA: Diagnosis not present

## 2022-03-10 DIAGNOSIS — M6281 Muscle weakness (generalized): Secondary | ICD-10-CM | POA: Diagnosis not present

## 2022-03-10 DIAGNOSIS — S43015D Anterior dislocation of left humerus, subsequent encounter: Secondary | ICD-10-CM | POA: Diagnosis not present

## 2022-03-10 DIAGNOSIS — M25612 Stiffness of left shoulder, not elsewhere classified: Secondary | ICD-10-CM | POA: Diagnosis not present

## 2022-03-15 DIAGNOSIS — S46012D Strain of muscle(s) and tendon(s) of the rotator cuff of left shoulder, subsequent encounter: Secondary | ICD-10-CM | POA: Diagnosis not present

## 2022-03-15 DIAGNOSIS — M25612 Stiffness of left shoulder, not elsewhere classified: Secondary | ICD-10-CM | POA: Diagnosis not present

## 2022-03-15 DIAGNOSIS — S43015D Anterior dislocation of left humerus, subsequent encounter: Secondary | ICD-10-CM | POA: Diagnosis not present

## 2022-03-15 DIAGNOSIS — M6281 Muscle weakness (generalized): Secondary | ICD-10-CM | POA: Diagnosis not present

## 2022-03-25 ENCOUNTER — Ambulatory Visit (INDEPENDENT_AMBULATORY_CARE_PROVIDER_SITE_OTHER): Payer: Medicare HMO | Admitting: Family Medicine

## 2022-03-25 ENCOUNTER — Encounter: Payer: Self-pay | Admitting: Family Medicine

## 2022-03-25 VITALS — BP 162/100 | HR 88 | Ht 63.0 in | Wt 202.8 lb

## 2022-03-25 DIAGNOSIS — I152 Hypertension secondary to endocrine disorders: Secondary | ICD-10-CM

## 2022-03-25 DIAGNOSIS — M25642 Stiffness of left hand, not elsewhere classified: Secondary | ICD-10-CM | POA: Diagnosis not present

## 2022-03-25 DIAGNOSIS — L299 Pruritus, unspecified: Secondary | ICD-10-CM | POA: Diagnosis not present

## 2022-03-25 DIAGNOSIS — M25442 Effusion, left hand: Secondary | ICD-10-CM | POA: Diagnosis not present

## 2022-03-25 DIAGNOSIS — M25632 Stiffness of left wrist, not elsewhere classified: Secondary | ICD-10-CM | POA: Diagnosis not present

## 2022-03-25 DIAGNOSIS — L989 Disorder of the skin and subcutaneous tissue, unspecified: Secondary | ICD-10-CM | POA: Diagnosis not present

## 2022-03-25 DIAGNOSIS — I872 Venous insufficiency (chronic) (peripheral): Secondary | ICD-10-CM

## 2022-03-25 DIAGNOSIS — E1169 Type 2 diabetes mellitus with other specified complication: Secondary | ICD-10-CM | POA: Diagnosis not present

## 2022-03-25 DIAGNOSIS — E1159 Type 2 diabetes mellitus with other circulatory complications: Secondary | ICD-10-CM

## 2022-03-25 DIAGNOSIS — R202 Paresthesia of skin: Secondary | ICD-10-CM | POA: Diagnosis not present

## 2022-03-25 DIAGNOSIS — Z6835 Body mass index (BMI) 35.0-35.9, adult: Secondary | ICD-10-CM

## 2022-03-25 DIAGNOSIS — M25612 Stiffness of left shoulder, not elsewhere classified: Secondary | ICD-10-CM | POA: Diagnosis not present

## 2022-03-25 DIAGNOSIS — M25432 Effusion, left wrist: Secondary | ICD-10-CM | POA: Diagnosis not present

## 2022-03-25 DIAGNOSIS — I10 Essential (primary) hypertension: Secondary | ICD-10-CM

## 2022-03-25 DIAGNOSIS — Z79899 Other long term (current) drug therapy: Secondary | ICD-10-CM

## 2022-03-25 DIAGNOSIS — R531 Weakness: Secondary | ICD-10-CM | POA: Diagnosis not present

## 2022-03-25 MED ORDER — OLMESARTAN MEDOXOMIL 20 MG PO TABS: 20.0000 mg | ORAL_TABLET | Freq: Every day | ORAL | 3 refills | Status: DC

## 2022-03-25 MED ORDER — HYDROCHLOROTHIAZIDE 12.5 MG PO CAPS: 12.5000 mg | ORAL_CAPSULE | Freq: Every day | ORAL | 3 refills | Status: DC

## 2022-03-25 MED ORDER — HYDROCORTISONE 1 % EX SOLN: 1.0000 "application " | Freq: Two times a day (BID) | CUTANEOUS | 2 refills | Status: DC

## 2022-03-25 NOTE — Patient Instructions (Addendum)
For your scalp itching, try hydrocortisone 1% solution, rub in well to itching sites, twice a day.   For your high blood pressure Take the HCTZ (hydrochlorothiazide) one capsule daily Take Olmesartan one tablet daily  Try stopping your lasix (furosemide).  If your ankle swelling gets a lot worse, start back with just half a tablet instead of a whole one.

## 2022-03-26 ENCOUNTER — Encounter: Payer: Self-pay | Admitting: Family Medicine

## 2022-03-26 DIAGNOSIS — L989 Disorder of the skin and subcutaneous tissue, unspecified: Secondary | ICD-10-CM | POA: Insufficient documentation

## 2022-03-26 LAB — BASIC METABOLIC PANEL
BUN/Creatinine Ratio: 15 (ref 12–28)
BUN: 11 mg/dL (ref 8–27)
CO2: 24 mmol/L (ref 20–29)
Calcium: 9.4 mg/dL (ref 8.7–10.3)
Chloride: 106 mmol/L (ref 96–106)
Creatinine, Ser: 0.74 mg/dL (ref 0.57–1.00)
Glucose: 82 mg/dL (ref 70–99)
Potassium: 3.1 mmol/L — ABNORMAL LOW (ref 3.5–5.2)
Sodium: 145 mmol/L — ABNORMAL HIGH (ref 134–144)
eGFR: 89 mL/min/{1.73_m2} (ref >59)

## 2022-03-26 LAB — HEMOGLOBIN A1C
Est. average glucose Bld gHb Est-mCnc: 114 mg/dL
Hgb A1c MFr Bld: 5.6 % (ref 4.8–5.6)

## 2022-03-26 NOTE — Assessment & Plan Note (Signed)
New complaint Itching scalp, no other tiching sites Two areas of increased itching Exam shows normal scalp in general and at specific most itching site.  No hair loss nor broken hairs noted.   A/ Nonspecific scalp itching. P/ Topical 1% hydrocortisone solution (Scalpicin) RTC if does not improve or worsens

## 2022-03-26 NOTE — Assessment & Plan Note (Signed)
Lab Results  Component Value Date   HGBA1C 5.6 03/25/2022   Established problem Well Controlled and is at goal of A1c < 7.0%. No signs of complications, medication side effects, or red flags. Continue current medications and other regiments.

## 2022-03-26 NOTE — Assessment & Plan Note (Signed)
Established problem that has improved and has meet goal of weight loss.  Wt Readings from Last 3 Encounters:  03/25/22 202 lb 12.8 oz (92 kg)  01/29/22 200 lb (90.7 kg)  12/17/21 215 lb (97.5 kg)   Recent plateau weight over last 1-2 months.   Continue semaglutide 2 mg [place] weekly. Discuss risk of weight gain with stopping of medication.

## 2022-03-26 NOTE — Progress Notes (Signed)
[name] is alone Sources of clinical information for visit is/are patient. Nursing assessment for this office visit was reviewed with the patient for accuracy and revision.     Previous Report(s) Reviewed: none     03/25/2022    3:31 PM  Depression screen PHQ 2/9  Decreased Interest 0  Down, Depressed, Hopeless 0  PHQ - 2 Score 0  Altered sleeping 0  Tired, decreased energy 0  Change in appetite 0  Feeling bad or failure about yourself  0  Trouble concentrating 0  Moving slowly or fidgety/restless 0  Suicidal thoughts 0  PHQ-9 Score 0  Difficult doing work/chores Not difficult at all   [name] Visit from 03/25/2022 in [facility] Office Visit from 12/17/2021 in [place] from 07/10/2021 in [place]  Thoughts that you would be better off dead, or of hurting yourself in some way Not at all Not at all Not at all  PHQ-9 Total Score 0 1 0          03/25/2022    3:26 PM 12/17/2021    4:20 PM 07/10/2021   12:38 PM 07/09/2021    4:03 PM 05/21/2021   10:47 AM  Fall Risk   Falls in the past year? '1 1  1 1  '$ Number falls in past yr: 0 '1 1 1 1  '$ Injury with Fall? 0  '1 1 1  '$ Risk for fall due to :  History of fall(s) History of fall(s)         03/25/2022    3:31 PM 12/17/2021    4:20 PM 07/10/2021   10:46 AM  PHQ9 SCORE ONLY  PHQ-9 Total Score 0 1 0    Adult vaccines due  Topic Date Due   TETANUS/TDAP  07/25/2025    Health Maintenance Due  Topic Date Due   Zoster Vaccines- Shingrix (2 of 2) 02/09/2017   DEXA SCAN  Never done      History/P.E. limitations: none  Adult vaccines due  Topic Date Due   TETANUS/TDAP  07/25/2025    Diabetes Health Maintenance Due  Topic Date Due   FOOT EXAM  05/21/2022   HEMOGLOBIN A1C  09/25/2022   OPHTHALMOLOGY EXAM  01/14/2023    Health Maintenance Due  Topic Date Due   Zoster Vaccines- Shingrix (2 of 2) 02/09/2017   DEXA SCAN  Never  done     Chief Complaint  Patient presents with   Follow-up   Hair/Scalp Problem

## 2022-03-26 NOTE — Assessment & Plan Note (Addendum)
Established problem Well Controlled and is at goal of <140/90. Basic Metabolic Panel:    Component Value Date/Time   NA 145 (H) 03/25/2022 1614   K 3.1 (L) 03/25/2022 1614   CL 106 03/25/2022 1614   CO2 24 03/25/2022 1614   BUN 11 03/25/2022 1614   CREATININE 0.74 03/25/2022 1614   CREATININE 0.85 12/08/2016 0929   GLUCOSE 82 03/25/2022 1614   GLUCOSE 92 06/10/2021 1937   CALCIUM 9.4 03/25/2022 1614   No signs of complications, medication side effects, or red flags. Continue current medications and other regiments.

## 2022-03-26 NOTE — Assessment & Plan Note (Addendum)
Established problem. Stable. Patient is at goal. No signs of complications, medication side effects, or red flags. Stop or half dose of Lasix Recommend wearing support hose RTC 1 month to follow up

## 2022-03-26 NOTE — Assessment & Plan Note (Signed)
Established problem that has improved and has meet goal of weight loss.  Wt Readings from Last 3 Encounters:  03/25/22 202 lb 12.8 oz (92 kg)  01/29/22 200 lb (90.7 kg)  12/17/21 215 lb (97.5 kg)   Recent plateau weight over last 1-2 months.   Continue semaglutide 2 mg [place] weekly. Discuss risk of weight gain with stopping of medication.

## 2022-04-01 DIAGNOSIS — M25642 Stiffness of left hand, not elsewhere classified: Secondary | ICD-10-CM | POA: Diagnosis not present

## 2022-04-01 DIAGNOSIS — M25432 Effusion, left wrist: Secondary | ICD-10-CM | POA: Diagnosis not present

## 2022-04-01 DIAGNOSIS — R202 Paresthesia of skin: Secondary | ICD-10-CM | POA: Diagnosis not present

## 2022-04-01 DIAGNOSIS — M25632 Stiffness of left wrist, not elsewhere classified: Secondary | ICD-10-CM | POA: Diagnosis not present

## 2022-04-01 DIAGNOSIS — M25612 Stiffness of left shoulder, not elsewhere classified: Secondary | ICD-10-CM | POA: Diagnosis not present

## 2022-04-01 DIAGNOSIS — R531 Weakness: Secondary | ICD-10-CM | POA: Diagnosis not present

## 2022-04-01 DIAGNOSIS — M25442 Effusion, left hand: Secondary | ICD-10-CM | POA: Diagnosis not present

## 2022-04-06 ENCOUNTER — Encounter: Payer: Self-pay | Admitting: *Deleted

## 2022-04-08 DIAGNOSIS — M25642 Stiffness of left hand, not elsewhere classified: Secondary | ICD-10-CM | POA: Diagnosis not present

## 2022-04-08 DIAGNOSIS — M25632 Stiffness of left wrist, not elsewhere classified: Secondary | ICD-10-CM | POA: Diagnosis not present

## 2022-04-08 DIAGNOSIS — R531 Weakness: Secondary | ICD-10-CM | POA: Diagnosis not present

## 2022-04-08 DIAGNOSIS — M25612 Stiffness of left shoulder, not elsewhere classified: Secondary | ICD-10-CM | POA: Diagnosis not present

## 2022-04-08 DIAGNOSIS — R202 Paresthesia of skin: Secondary | ICD-10-CM | POA: Diagnosis not present

## 2022-04-08 DIAGNOSIS — M25432 Effusion, left wrist: Secondary | ICD-10-CM | POA: Diagnosis not present

## 2022-04-08 DIAGNOSIS — M25442 Effusion, left hand: Secondary | ICD-10-CM | POA: Diagnosis not present

## 2022-04-13 DIAGNOSIS — M25432 Effusion, left wrist: Secondary | ICD-10-CM | POA: Diagnosis not present

## 2022-04-13 DIAGNOSIS — M25442 Effusion, left hand: Secondary | ICD-10-CM | POA: Diagnosis not present

## 2022-04-13 DIAGNOSIS — M25642 Stiffness of left hand, not elsewhere classified: Secondary | ICD-10-CM | POA: Diagnosis not present

## 2022-04-13 DIAGNOSIS — R202 Paresthesia of skin: Secondary | ICD-10-CM | POA: Diagnosis not present

## 2022-04-13 DIAGNOSIS — R531 Weakness: Secondary | ICD-10-CM | POA: Diagnosis not present

## 2022-04-13 DIAGNOSIS — M25612 Stiffness of left shoulder, not elsewhere classified: Secondary | ICD-10-CM | POA: Diagnosis not present

## 2022-04-13 DIAGNOSIS — M25632 Stiffness of left wrist, not elsewhere classified: Secondary | ICD-10-CM | POA: Diagnosis not present

## 2022-04-20 DIAGNOSIS — R202 Paresthesia of skin: Secondary | ICD-10-CM | POA: Diagnosis not present

## 2022-04-20 DIAGNOSIS — M25612 Stiffness of left shoulder, not elsewhere classified: Secondary | ICD-10-CM | POA: Diagnosis not present

## 2022-04-20 DIAGNOSIS — M25442 Effusion, left hand: Secondary | ICD-10-CM | POA: Diagnosis not present

## 2022-04-20 DIAGNOSIS — M25632 Stiffness of left wrist, not elsewhere classified: Secondary | ICD-10-CM | POA: Diagnosis not present

## 2022-04-20 DIAGNOSIS — R531 Weakness: Secondary | ICD-10-CM | POA: Diagnosis not present

## 2022-04-20 DIAGNOSIS — M25642 Stiffness of left hand, not elsewhere classified: Secondary | ICD-10-CM | POA: Diagnosis not present

## 2022-04-20 DIAGNOSIS — M25432 Effusion, left wrist: Secondary | ICD-10-CM | POA: Diagnosis not present

## 2022-04-22 DIAGNOSIS — M25432 Effusion, left wrist: Secondary | ICD-10-CM | POA: Diagnosis not present

## 2022-04-22 DIAGNOSIS — R202 Paresthesia of skin: Secondary | ICD-10-CM | POA: Diagnosis not present

## 2022-04-22 DIAGNOSIS — M25442 Effusion, left hand: Secondary | ICD-10-CM | POA: Diagnosis not present

## 2022-04-22 DIAGNOSIS — M25642 Stiffness of left hand, not elsewhere classified: Secondary | ICD-10-CM | POA: Diagnosis not present

## 2022-04-22 DIAGNOSIS — M25632 Stiffness of left wrist, not elsewhere classified: Secondary | ICD-10-CM | POA: Diagnosis not present

## 2022-04-22 DIAGNOSIS — R531 Weakness: Secondary | ICD-10-CM | POA: Diagnosis not present

## 2022-04-22 DIAGNOSIS — M25612 Stiffness of left shoulder, not elsewhere classified: Secondary | ICD-10-CM | POA: Diagnosis not present

## 2022-04-27 DIAGNOSIS — R531 Weakness: Secondary | ICD-10-CM | POA: Diagnosis not present

## 2022-04-27 DIAGNOSIS — M25632 Stiffness of left wrist, not elsewhere classified: Secondary | ICD-10-CM | POA: Diagnosis not present

## 2022-04-27 DIAGNOSIS — M25612 Stiffness of left shoulder, not elsewhere classified: Secondary | ICD-10-CM | POA: Diagnosis not present

## 2022-04-27 DIAGNOSIS — M25442 Effusion, left hand: Secondary | ICD-10-CM | POA: Diagnosis not present

## 2022-04-27 DIAGNOSIS — R202 Paresthesia of skin: Secondary | ICD-10-CM | POA: Diagnosis not present

## 2022-04-27 DIAGNOSIS — M25642 Stiffness of left hand, not elsewhere classified: Secondary | ICD-10-CM | POA: Diagnosis not present

## 2022-04-27 DIAGNOSIS — M25432 Effusion, left wrist: Secondary | ICD-10-CM | POA: Diagnosis not present

## 2022-04-29 DIAGNOSIS — M25612 Stiffness of left shoulder, not elsewhere classified: Secondary | ICD-10-CM | POA: Diagnosis not present

## 2022-04-29 DIAGNOSIS — M25442 Effusion, left hand: Secondary | ICD-10-CM | POA: Diagnosis not present

## 2022-04-29 DIAGNOSIS — M25632 Stiffness of left wrist, not elsewhere classified: Secondary | ICD-10-CM | POA: Diagnosis not present

## 2022-04-29 DIAGNOSIS — R531 Weakness: Secondary | ICD-10-CM | POA: Diagnosis not present

## 2022-04-29 DIAGNOSIS — M25432 Effusion, left wrist: Secondary | ICD-10-CM | POA: Diagnosis not present

## 2022-04-29 DIAGNOSIS — M25642 Stiffness of left hand, not elsewhere classified: Secondary | ICD-10-CM | POA: Diagnosis not present

## 2022-04-29 DIAGNOSIS — R202 Paresthesia of skin: Secondary | ICD-10-CM | POA: Diagnosis not present

## 2022-05-03 DIAGNOSIS — S43015D Anterior dislocation of left humerus, subsequent encounter: Secondary | ICD-10-CM | POA: Diagnosis not present

## 2022-05-06 DIAGNOSIS — M25612 Stiffness of left shoulder, not elsewhere classified: Secondary | ICD-10-CM | POA: Diagnosis not present

## 2022-05-06 DIAGNOSIS — R531 Weakness: Secondary | ICD-10-CM | POA: Diagnosis not present

## 2022-05-06 DIAGNOSIS — M25632 Stiffness of left wrist, not elsewhere classified: Secondary | ICD-10-CM | POA: Diagnosis not present

## 2022-05-06 DIAGNOSIS — M25432 Effusion, left wrist: Secondary | ICD-10-CM | POA: Diagnosis not present

## 2022-05-06 DIAGNOSIS — M25642 Stiffness of left hand, not elsewhere classified: Secondary | ICD-10-CM | POA: Diagnosis not present

## 2022-05-06 DIAGNOSIS — M25442 Effusion, left hand: Secondary | ICD-10-CM | POA: Diagnosis not present

## 2022-05-06 DIAGNOSIS — R202 Paresthesia of skin: Secondary | ICD-10-CM | POA: Diagnosis not present

## 2022-05-11 DIAGNOSIS — G5622 Lesion of ulnar nerve, left upper limb: Secondary | ICD-10-CM | POA: Diagnosis not present

## 2022-05-11 DIAGNOSIS — G5602 Carpal tunnel syndrome, left upper limb: Secondary | ICD-10-CM | POA: Diagnosis not present

## 2022-05-13 DIAGNOSIS — M25442 Effusion, left hand: Secondary | ICD-10-CM | POA: Diagnosis not present

## 2022-05-13 DIAGNOSIS — M25612 Stiffness of left shoulder, not elsewhere classified: Secondary | ICD-10-CM | POA: Diagnosis not present

## 2022-05-13 DIAGNOSIS — R531 Weakness: Secondary | ICD-10-CM | POA: Diagnosis not present

## 2022-05-13 DIAGNOSIS — M25432 Effusion, left wrist: Secondary | ICD-10-CM | POA: Diagnosis not present

## 2022-05-13 DIAGNOSIS — R202 Paresthesia of skin: Secondary | ICD-10-CM | POA: Diagnosis not present

## 2022-05-13 DIAGNOSIS — M25642 Stiffness of left hand, not elsewhere classified: Secondary | ICD-10-CM | POA: Diagnosis not present

## 2022-05-13 DIAGNOSIS — M25632 Stiffness of left wrist, not elsewhere classified: Secondary | ICD-10-CM | POA: Diagnosis not present

## 2022-05-18 ENCOUNTER — Other Ambulatory Visit: Payer: Self-pay | Admitting: Family Medicine

## 2022-05-25 DIAGNOSIS — M25642 Stiffness of left hand, not elsewhere classified: Secondary | ICD-10-CM | POA: Diagnosis not present

## 2022-05-25 DIAGNOSIS — M25442 Effusion, left hand: Secondary | ICD-10-CM | POA: Diagnosis not present

## 2022-05-25 DIAGNOSIS — R202 Paresthesia of skin: Secondary | ICD-10-CM | POA: Diagnosis not present

## 2022-05-25 DIAGNOSIS — R531 Weakness: Secondary | ICD-10-CM | POA: Diagnosis not present

## 2022-05-25 DIAGNOSIS — M25612 Stiffness of left shoulder, not elsewhere classified: Secondary | ICD-10-CM | POA: Diagnosis not present

## 2022-05-25 DIAGNOSIS — M25432 Effusion, left wrist: Secondary | ICD-10-CM | POA: Diagnosis not present

## 2022-05-25 DIAGNOSIS — M25632 Stiffness of left wrist, not elsewhere classified: Secondary | ICD-10-CM | POA: Diagnosis not present

## 2022-05-31 DIAGNOSIS — M25562 Pain in left knee: Secondary | ICD-10-CM | POA: Diagnosis not present

## 2022-05-31 DIAGNOSIS — M25432 Effusion, left wrist: Secondary | ICD-10-CM | POA: Diagnosis not present

## 2022-05-31 DIAGNOSIS — M25561 Pain in right knee: Secondary | ICD-10-CM | POA: Diagnosis not present

## 2022-05-31 DIAGNOSIS — M25442 Effusion, left hand: Secondary | ICD-10-CM | POA: Diagnosis not present

## 2022-05-31 DIAGNOSIS — M25612 Stiffness of left shoulder, not elsewhere classified: Secondary | ICD-10-CM | POA: Diagnosis not present

## 2022-05-31 DIAGNOSIS — M25632 Stiffness of left wrist, not elsewhere classified: Secondary | ICD-10-CM | POA: Diagnosis not present

## 2022-05-31 DIAGNOSIS — R531 Weakness: Secondary | ICD-10-CM | POA: Diagnosis not present

## 2022-05-31 DIAGNOSIS — R202 Paresthesia of skin: Secondary | ICD-10-CM | POA: Diagnosis not present

## 2022-05-31 DIAGNOSIS — M25642 Stiffness of left hand, not elsewhere classified: Secondary | ICD-10-CM | POA: Diagnosis not present

## 2022-06-01 ENCOUNTER — Other Ambulatory Visit: Payer: Self-pay | Admitting: Family Medicine

## 2022-06-01 DIAGNOSIS — Z1231 Encounter for screening mammogram for malignant neoplasm of breast: Secondary | ICD-10-CM

## 2022-06-03 ENCOUNTER — Ambulatory Visit
Admission: RE | Admit: 2022-06-03 | Discharge: 2022-06-03 | Disposition: A | Discharge status: Home / Self Care | Payer: Medicare HMO | Source: Ambulatory Visit | Attending: Family Medicine | Admitting: Family Medicine

## 2022-06-03 DIAGNOSIS — Z780 Asymptomatic menopausal state: Secondary | ICD-10-CM | POA: Diagnosis not present

## 2022-06-03 DIAGNOSIS — E2839 Other primary ovarian failure: Secondary | ICD-10-CM

## 2022-06-10 ENCOUNTER — Encounter: Payer: Self-pay | Admitting: Family Medicine

## 2022-06-10 ENCOUNTER — Ambulatory Visit (INDEPENDENT_AMBULATORY_CARE_PROVIDER_SITE_OTHER): Payer: Medicare HMO | Admitting: Family Medicine

## 2022-06-10 VITALS — BP 119/83 | HR 86 | Ht 63.0 in | Wt 192.6 lb

## 2022-06-10 DIAGNOSIS — G4762 Sleep related leg cramps: Secondary | ICD-10-CM | POA: Diagnosis not present

## 2022-06-10 DIAGNOSIS — E1159 Type 2 diabetes mellitus with other circulatory complications: Secondary | ICD-10-CM | POA: Diagnosis not present

## 2022-06-10 DIAGNOSIS — E669 Obesity, unspecified: Secondary | ICD-10-CM

## 2022-06-10 DIAGNOSIS — E66811 Obesity, class 1: Secondary | ICD-10-CM

## 2022-06-10 DIAGNOSIS — I152 Hypertension secondary to endocrine disorders: Secondary | ICD-10-CM | POA: Diagnosis not present

## 2022-06-10 DIAGNOSIS — R252 Cramp and spasm: Secondary | ICD-10-CM

## 2022-06-10 DIAGNOSIS — E876 Hypokalemia: Secondary | ICD-10-CM | POA: Diagnosis not present

## 2022-06-10 DIAGNOSIS — E785 Hyperlipidemia, unspecified: Secondary | ICD-10-CM

## 2022-06-10 DIAGNOSIS — I872 Venous insufficiency (chronic) (peripheral): Secondary | ICD-10-CM | POA: Diagnosis not present

## 2022-06-10 DIAGNOSIS — E1169 Type 2 diabetes mellitus with other specified complication: Secondary | ICD-10-CM | POA: Diagnosis not present

## 2022-06-10 MED ORDER — BACLOFEN 5 MG PO TABS: 1.0000 | ORAL_TABLET | Freq: Three times a day (TID) | ORAL | 1 refills | Status: DC | PRN

## 2022-06-10 NOTE — Assessment & Plan Note (Signed)
Established problem Tolerating atorvastatin No signs of complications, medication side effects, or red flags. Continue current medications and other regiments.

## 2022-06-10 NOTE — Patient Instructions (Signed)
Please try taking the mustard daily to prevent muscle cramps during your sleep.  A glass of Tonic Water before bed may also help.  Finally, letting hot water from a shower run down the back of your legs for about 5 minute.  Do this just before you go to bed.   We are checking for low potassium and magnesium levels today to make sure they are not causing your cramps.    Use the muscle relaxing medication, Baclofen, one tablet up to three times a day if you need it for your back pain.

## 2022-06-10 NOTE — Assessment & Plan Note (Signed)
Established problem. Adequate blood pressure control.  No evidence of new end organ damage.  Tolerating medication without significant adverse effects.  Plan to continue current blood pressure medication regiment.   

## 2022-06-10 NOTE — Progress Notes (Signed)
[name] is alone Sources of clinical information for visit is/are patient. Nursing assessment for this office visit was reviewed with the patient for accuracy and revision.     Previous Report(s) Reviewed: labs and DEXA report     06/10/2022   10:35 AM  Depression screen PHQ 2/9  Decreased Interest 0  Down, Depressed, Hopeless 0  PHQ - 2 Score 0  Altered sleeping 1  Tired, decreased energy 1  Change in appetite 1  Feeling bad or failure about yourself  0  Trouble concentrating 0  Moving slowly or fidgety/restless 0  Suicidal thoughts 0  PHQ-9 Score 3  Difficult doing work/chores Not difficult at all   [facility] Visit from 06/10/2022 in [place] Office Visit from 03/25/2022 in [facility] Office Visit from 12/17/2021 in [place]  Thoughts that you would be better off dead, or of hurting yourself in some way Not at all Not at all Not at all  PHQ-9 Total Score 3 0 1          03/25/2022    3:26 PM 12/17/2021    4:20 PM 07/10/2021   12:38 PM 07/09/2021    4:03 PM 05/21/2021   10:47 AM  Fall Risk   Falls in the past year? '1 1  1 1  '$ Number falls in past yr: 0 '1 1 1 1  '$ Injury with Fall? 0  '1 1 1  '$ Risk for fall due to :  History of fall(s) History of fall(s)         06/10/2022   10:35 AM 03/25/2022    3:31 PM 12/17/2021    4:20 PM  PHQ9 SCORE ONLY  PHQ-9 Total Score 3 0 1    Adult vaccines due  Topic Date Due   TETANUS/TDAP  07/25/2025    Health Maintenance Due  Topic Date Due   COVID-19 Vaccine (5 - Pfizer risk series) 05/28/2021   MAMMOGRAM  03/28/2022   FOOT EXAM  05/21/2022   INFLUENZA VACCINE  06/01/2022      History/P.E. limitations: none  Adult vaccines due  Topic Date Due   TETANUS/TDAP  07/25/2025    Diabetes Health Maintenance Due  Topic Date Due   FOOT EXAM  05/21/2022   HEMOGLOBIN A1C  09/25/2022   OPHTHALMOLOGY EXAM  01/14/2023    Health Maintenance Due   Topic Date Due   COVID-19 Vaccine (5 - Pfizer risk series) 05/28/2021   MAMMOGRAM  03/28/2022   FOOT EXAM  05/21/2022   INFLUENZA VACCINE  06/01/2022     Chief Complaint  Patient presents with   Leg cramps   Flank Pain    Arterial-brachial Index > 1.0 bilaterally --------------------------------------------------------------------------------------------------------------------------------------------- Visit Problem List with A/P  No problem-specific Assessment & Plan notes found for this encounter.

## 2022-06-11 ENCOUNTER — Encounter: Payer: Self-pay | Admitting: Family Medicine

## 2022-06-11 ENCOUNTER — Other Ambulatory Visit: Payer: Self-pay | Admitting: Family Medicine

## 2022-06-11 ENCOUNTER — Telehealth: Payer: Self-pay | Admitting: Family Medicine

## 2022-06-11 DIAGNOSIS — Z9884 Bariatric surgery status: Secondary | ICD-10-CM

## 2022-06-11 DIAGNOSIS — E66811 Obesity, class 1: Secondary | ICD-10-CM

## 2022-06-11 DIAGNOSIS — G4762 Sleep related leg cramps: Secondary | ICD-10-CM

## 2022-06-11 DIAGNOSIS — E669 Obesity, unspecified: Secondary | ICD-10-CM | POA: Insufficient documentation

## 2022-06-11 HISTORY — DX: Sleep related leg cramps: G47.62

## 2022-06-11 HISTORY — DX: Obesity, class 1: E66.811

## 2022-06-11 LAB — BASIC METABOLIC PANEL
BUN/Creatinine Ratio: 20 (ref 12–28)
BUN: 19 mg/dL (ref 8–27)
CO2: 25 mmol/L (ref 20–29)
Calcium: 9.6 mg/dL (ref 8.7–10.3)
Chloride: 105 mmol/L (ref 96–106)
Creatinine, Ser: 0.96 mg/dL (ref 0.57–1.00)
Glucose: 64 mg/dL — ABNORMAL LOW (ref 70–99)
Potassium: 3.4 mmol/L — ABNORMAL LOW (ref 3.5–5.2)
Sodium: 145 mmol/L — ABNORMAL HIGH (ref 134–144)
eGFR: 65 mL/min/{1.73_m2} (ref >59)

## 2022-06-11 MED ORDER — POTASSIUM CHLORIDE CRYS ER 20 MEQ PO TBCR: 40.0000 meq | EXTENDED_RELEASE_TABLET | Freq: Every day | ORAL | 99 refills | Status: DC

## 2022-06-11 NOTE — Assessment & Plan Note (Signed)
Bialteral ABI indexes > 1.0

## 2022-06-11 NOTE — Assessment & Plan Note (Signed)
Recurrent lab finding Related to loop diuretic-induced loss  Recommended patient decrease Lasix 40 mg daily to every other day  Reocmmend start back KCl 40 mEq by mouth daily.

## 2022-06-11 NOTE — Assessment & Plan Note (Signed)
Established problem that continues to improve Wt Readings from Last 3 Encounters:  06/10/22 192 lb 9.6 oz (87.4 kg)  03/25/22 202 lb 12.8 oz (92 kg)  01/29/22 200 lb (90.7 kg)  Additional 10 pound loss since May.   Continue semaglutide 2 mg [place] weekly

## 2022-06-11 NOTE — Assessment & Plan Note (Signed)
Wt Readings from Last 3 Encounters:  06/10/22 192 lb 9.6 oz (87.4 kg)  03/25/22 202 lb 12.8 oz (92 kg)  01/29/22 200 lb (90.7 kg)   Ongoing weight loss with Semaglutide 2 mg  [place] weekly. Plan to continue semaglutide same weekly dose.

## 2022-06-11 NOTE — Assessment & Plan Note (Signed)
Onset: couple days ago, onset upon awakening in morning Location: left lower back Quality: aching Severity: mild Function: not preventing work Radiation: none Relief: Prior episodes improved with Flexeril per patietn recall Precipitant: none recelled No saddle dysesthesias, no change in bladder functioning  Tender points along left lumbar paraspinal muscle bundle Normal LE strength, 2+ DTR knee  A/ Nonspecific low back pain P? Stretching Trial Baclofen 5 mg three times a day prn APAP prn

## 2022-06-11 NOTE — Assessment & Plan Note (Addendum)
.  recurrent problem for patient Primarily at night in in bilateral calves.  She is able to walk the cramp away. No feeling as if she has to move her legs  Serum potassium low 3.4 on lasix  ABI normal bilateral today  Recommendation Bedtime stretching legs, Warm shower water on legs before bed May try mustard po dialy May try tonic water before bed  Other options: Vit B complex, gabapentin.

## 2022-06-14 NOTE — Telephone Encounter (Signed)
Slightly low potassium level.  Recommend restart home potassium 20 mEq daily.

## 2022-06-15 ENCOUNTER — Ambulatory Visit
Admission: RE | Admit: 2022-06-15 | Discharge: 2022-06-15 | Disposition: A | Discharge status: Home / Self Care | Payer: Medicare HMO | Source: Ambulatory Visit | Attending: Family Medicine | Admitting: Family Medicine

## 2022-06-15 DIAGNOSIS — Z1231 Encounter for screening mammogram for malignant neoplasm of breast: Secondary | ICD-10-CM

## 2022-07-01 DIAGNOSIS — G5602 Carpal tunnel syndrome, left upper limb: Secondary | ICD-10-CM | POA: Diagnosis not present

## 2022-07-01 DIAGNOSIS — G5622 Lesion of ulnar nerve, left upper limb: Secondary | ICD-10-CM | POA: Diagnosis not present

## 2022-07-12 DIAGNOSIS — G5602 Carpal tunnel syndrome, left upper limb: Secondary | ICD-10-CM | POA: Diagnosis not present

## 2022-07-19 ENCOUNTER — Other Ambulatory Visit: Payer: Self-pay | Admitting: Family Medicine

## 2022-07-19 DIAGNOSIS — E1169 Type 2 diabetes mellitus with other specified complication: Secondary | ICD-10-CM

## 2022-07-19 DIAGNOSIS — Z6841 Body Mass Index (BMI) 40.0 and over, adult: Secondary | ICD-10-CM

## 2022-07-28 ENCOUNTER — Other Ambulatory Visit: Payer: Self-pay | Admitting: Internal Medicine

## 2022-08-03 ENCOUNTER — Telehealth: Payer: Self-pay

## 2022-08-03 NOTE — Patient Outreach (Signed)
  Care Coordination   08/03/2022 Name: [name] MRN: [id number] DOB: 28-Jun-1956   Care Coordination Outreach Attempts:  An unsuccessful telephone outreach was attempted today to offer the patient information about available care coordination services as a benefit of their health plan.   Follow Up Plan:  Additional outreach attempts will be made to offer the patient care coordination information and services.   Encounter Outcome:  No Answer  Care Coordination Interventions Activated:  No   Care Coordination Interventions:  No, not indicated    Jone Baseman, RN, MSN [facility] Care Management Care Management Coordinator Direct Line [phone]

## 2022-08-13 ENCOUNTER — Telehealth: Payer: Self-pay

## 2022-08-13 DIAGNOSIS — R110 Nausea: Secondary | ICD-10-CM

## 2022-08-13 NOTE — Telephone Encounter (Signed)
Patient calls nurse line regarding nausea. Onset three weeks ago. Also reports a decrease in appetite. She has been drinking Ensures due to decreased appetite.   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 Reports stool as watery. Denies blood in stool.   Denies vomiting, fever, abdominal pain.   She is scheduled with PCP on 10/26. Offered to schedule for next week with another provider, patient declined and would like to wait to see PCP.   She is requesting anti nausea medication to get her through her appointment.   Will forward request to PCP.   Talbot Grumbling, RN

## 2022-08-14 MED ORDER — ONDANSETRON HCL 4 MG PO TABS: 4.0000 mg | ORAL_TABLET | Freq: Three times a day (TID) | ORAL | 0 refills | Status: DC | PRN

## 2022-08-14 NOTE — Telephone Encounter (Signed)
Prescription sent Zofran 4 mg by mouth q8h prn n/v

## 2022-08-17 ENCOUNTER — Telehealth: Payer: Self-pay

## 2022-08-17 NOTE — Patient Outreach (Signed)
  Care Coordination   08/17/2022 Name: [name] MRN: [id number] DOB: [birth year]   Care Coordination Outreach Attempts:  A second unsuccessful outreach was attempted today to offer the patient with information about available care coordination services as a benefit of their health plan.     Follow Up Plan:  Additional outreach attempts will be made to offer the patient care coordination information and services.   Encounter Outcome:  No Answer  Care Coordination Interventions Activated:  No   Care Coordination Interventions:  No, not indicated    Jone Baseman, RN, MSN [facility] Care Management Care Management Coordinator Direct Line [phone]

## 2022-08-18 DIAGNOSIS — G5602 Carpal tunnel syndrome, left upper limb: Secondary | ICD-10-CM | POA: Diagnosis not present

## 2022-08-18 DIAGNOSIS — G5622 Lesion of ulnar nerve, left upper limb: Secondary | ICD-10-CM | POA: Diagnosis not present

## 2022-08-20 ENCOUNTER — Other Ambulatory Visit: Payer: Self-pay | Admitting: Family Medicine

## 2022-08-26 ENCOUNTER — Encounter: Payer: Self-pay | Admitting: Family Medicine

## 2022-08-26 ENCOUNTER — Ambulatory Visit (INDEPENDENT_AMBULATORY_CARE_PROVIDER_SITE_OTHER): Payer: Medicare HMO | Admitting: Family Medicine

## 2022-08-26 VITALS — BP 124/83 | Temp 83.0°F | Ht 63.0 in | Wt 183.1 lb

## 2022-08-26 DIAGNOSIS — Z23 Encounter for immunization: Secondary | ICD-10-CM | POA: Diagnosis not present

## 2022-08-26 DIAGNOSIS — E669 Obesity, unspecified: Secondary | ICD-10-CM | POA: Diagnosis not present

## 2022-08-26 DIAGNOSIS — R110 Nausea: Secondary | ICD-10-CM

## 2022-08-26 LAB — POCT GLYCOSYLATED HEMOGLOBIN (HGB A1C): HbA1c, POC (controlled diabetic range): 5.2 % (ref 0.0–7.0)

## 2022-08-26 NOTE — Patient Instructions (Addendum)
I am concerned that the nausea is from the Ozempic '2mg'$  dose.  Lets stop the Ozempic for now, wait a month to see if your nausea goes away. If the nausea goes away, we can restart you at a lower dose to see if you can tolerate it.   Make sure to check out at the front desk before you leave.  If you had blood tests today, Dr Hara Milholland will send you a MyChart message or a letter if the results are normal.  Otherwise, he will give you a call.   If Dr Travontae Freiberger ordered you to have a referral, you should hear from the referral's office to schedule an appointment.    If you have not heard from the referral's office within 5 business-days, please let Dr Kenlee Maler know by sending him a message through [facility], or calling the [facility] [phone]) to leave him a message.

## 2022-08-27 ENCOUNTER — Encounter: Payer: Self-pay | Admitting: Family Medicine

## 2022-08-27 DIAGNOSIS — R110 Nausea: Secondary | ICD-10-CM | POA: Insufficient documentation

## 2022-08-27 LAB — CBC WITH DIFFERENTIAL/PLATELET
Basophils Absolute: 0 10*3/uL (ref 0.0–0.2)
Basos: 0 %
EOS (ABSOLUTE): 0.2 10*3/uL (ref 0.0–0.4)
Eos: 4 %
Hematocrit: 34.5 % (ref 34.0–46.6)
Hemoglobin: 11.8 g/dL (ref 11.1–15.9)
Immature Grans (Abs): 0 10*3/uL (ref 0.0–0.1)
Immature Granulocytes: 0 %
Lymphocytes Absolute: 1.4 10*3/uL (ref 0.7–3.1)
Lymphs: 26 %
MCH: 31.6 pg (ref 26.6–33.0)
MCHC: 34.2 g/dL (ref 31.5–35.7)
MCV: 92 fL (ref 79–97)
Monocytes Absolute: 0.5 10*3/uL (ref 0.1–0.9)
Monocytes: 10 %
Neutrophils Absolute: 3.1 10*3/uL (ref 1.4–7.0)
Neutrophils: 60 %
Platelets: 215 10*3/uL (ref 150–450)
RBC: 3.74 x10E6/uL — ABNORMAL LOW (ref 3.77–5.28)
RDW: 12.8 % (ref 11.7–15.4)
WBC: 5.3 10*3/uL (ref 3.4–10.8)

## 2022-08-27 LAB — CMP14+EGFR
ALT: 16 IU/L (ref 0–32)
AST: 21 IU/L (ref 0–40)
Albumin/Globulin Ratio: 1.9 (ref 1.2–2.2)
Albumin: 4.2 g/dL (ref 3.9–4.9)
Alkaline Phosphatase: 78 IU/L (ref 44–121)
BUN/Creatinine Ratio: 12 (ref 12–28)
BUN: 13 mg/dL (ref 8–27)
Bilirubin Total: 0.3 mg/dL (ref 0.0–1.2)
CO2: 23 mmol/L (ref 20–29)
Calcium: 9.4 mg/dL (ref 8.7–10.3)
Chloride: 107 mmol/L — ABNORMAL HIGH (ref 96–106)
Creatinine, Ser: 1.08 mg/dL — ABNORMAL HIGH (ref 0.57–1.00)
Globulin, Total: 2.2 g/dL (ref 1.5–4.5)
Glucose: 84 mg/dL (ref 70–99)
Potassium: 4.1 mmol/L (ref 3.5–5.2)
Sodium: 144 mmol/L (ref 134–144)
Total Protein: 6.4 g/dL (ref 6.0–8.5)
eGFR: 57 mL/min/{1.73_m2} — ABNORMAL LOW (ref >59)

## 2022-08-27 LAB — LIPASE: Lipase: 19 U/L (ref 14–72)

## 2022-08-27 LAB — SEDIMENTATION RATE: Sed Rate: 3 mm/hr (ref 0–40)

## 2022-08-27 MED ORDER — ONDANSETRON HCL 4 MG PO TABS: 4.0000 mg | ORAL_TABLET | Freq: Three times a day (TID) | ORAL | 0 refills | Status: DC | PRN

## 2022-08-27 NOTE — Assessment & Plan Note (Signed)
Established problem continues to show weight loss.  Wt Readings from Last 3 Encounters:  08/26/22 183 lb 2 oz (83.1 kg)  06/10/22 192 lb 9.6 oz (87.4 kg)  03/25/22 202 lb 12.8 oz (92 kg)  Additional weight loss of 9 pounds from August.  Maximum weight 232 pounds from July 2022 - 49 pound weight loss  Will hold semaglutide for now givne problem of morning nausea that is not tolerable.  RTC in a month.  If nausea reslved, then restart titration semaglutide, with probable stopping at 1 mg weekly.

## 2022-08-27 NOTE — Progress Notes (Signed)
[name] is alone Sources of clinical information for visit is/are patient. Nursing assessment for this office visit was reviewed with the patient for accuracy and revision.     Previous Report(s) Reviewed: historical medical records     08/26/2022   11:11 AM  Depression screen PHQ 2/9  Decreased Interest 0  Down, Depressed, Hopeless 0  PHQ - 2 Score 0  Altered sleeping 1  Tired, decreased energy 0  Change in appetite 0  Feeling bad or failure about yourself  0  Trouble concentrating 0  Moving slowly or fidgety/restless 0  Suicidal thoughts 0  PHQ-9 Score 1  Difficult doing work/chores Not difficult at all   Christie Visit from 08/26/2022 in [place] Office Visit from 06/10/2022 in [place] Office Visit from 03/25/2022 in [place]  Thoughts that you would be better off dead, or of hurting yourself in some way Not at all Not at all Not at all  PHQ-9 Total Score 1 3 0          08/26/2022   11:12 AM 03/25/2022    3:26 PM 12/17/2021    4:20 PM 07/10/2021   12:38 PM 07/09/2021    4:03 PM  Fall Risk   Falls in the past year? 0 '1 1  1  '$ Number falls in past yr: 0 0 '1 1 1  '$ Injury with Fall? 0 0  1 1  Risk for fall due to :   History of fall(s) History of fall(s)        08/26/2022   11:11 AM 06/10/2022   10:35 AM 03/25/2022    3:31 PM  PHQ9 SCORE ONLY  PHQ-9 Total Score 1 3 0    Adult vaccines due  Topic Date Due   TETANUS/TDAP  07/25/2025    Health Maintenance Due  Topic Date Due   Diabetic kidney evaluation - Urine ACR  Never done   COVID-19 Vaccine (5 - Pfizer risk series) 05/28/2021   FOOT EXAM  05/21/2022   Medicare Annual Wellness (AWV)  07/10/2022      History/P.E. limitations: none  Adult vaccines due  Topic Date Due   TETANUS/TDAP  07/25/2025    Diabetes Health Maintenance Due  Topic Date Due   FOOT EXAM  05/21/2022   OPHTHALMOLOGY EXAM  01/14/2023    HEMOGLOBIN A1C  02/25/2023    Health Maintenance Due  Topic Date Due   Diabetic kidney evaluation - Urine ACR  Never done   COVID-19 Vaccine (5 - Pfizer risk series) 05/28/2021   FOOT EXAM  05/21/2022   Medicare Annual Wellness (AWV)  07/10/2022     Chief Complaint  Patient presents with   Nausea     --------------------------------------------------------------------------------------------------------------------------------------------- Visit Problem List with A/P  No problem-specific Assessment & Plan notes found for this encounter.

## 2022-08-27 NOTE — Assessment & Plan Note (Addendum)
New complaint Morning nausea without vomiting for last.  Resolves with time.  Decreased appetite. Taking nutritional shakes. No change in Bowel habits.  No diarrhea/constipation. No GERD, No dysphagia. No abdominal pain. No fever.  No headaches.     Taking Semaglutide 2 mg [place] weekly Relevant PM/SH: Roux-en-y Bypass 2013  Exam:  Abdomin: soft, NT, ND, (+)BS, no masses  Comprehensive Metabolic Panel:    Component Value Date/Time   NA 144 08/26/2022 1425   K 4.1 08/26/2022 1425   CL 107 (H) 08/26/2022 1425   CO2 23 08/26/2022 1425   BUN 13 08/26/2022 1425   CREATININE 1.08 (H) 08/26/2022 1425   CREATININE 0.85 12/08/2016 0929   GLUCOSE 84 08/26/2022 1425   GLUCOSE 92 06/10/2021 1937   CALCIUM 9.4 08/26/2022 1425   AST 21 08/26/2022 1425   ALT 16 08/26/2022 1425   ALKPHOS 78 08/26/2022 1425   BILITOT 0.3 08/26/2022 1425   PROT 6.4 08/26/2022 1425   ALBUMIN 4.2 08/26/2022 1425   CBC:    Component Value Date/Time   WBC 5.3 08/26/2022 1425   WBC 5.8 06/10/2021 1937   HGB 11.8 08/26/2022 1425   HCT 34.5 08/26/2022 1425   PLT 215 08/26/2022 1425   MCV 92 08/26/2022 1425   NEUTROABS 3.1 08/26/2022 1425   LYMPHSABS 1.4 08/26/2022 1425   MONOABS 0.6 06/10/2021 1937   EOSABS 0.2 08/26/2022 1425   BASOSABS 0.0 08/26/2022 1425   Lipase     Component Value Date/Time   LIPASE 19 08/26/2022 1425   Erythrocyte Sedimentation Rate     Component Value Date/Time   ESRSEDRATE 3 08/26/2022 1425      A/ Persistent morning nausea - Suspect this may be semaglutide related nausea.  P/ - Trial of stopping semaglutide, if not improvement would have low threshold for asking GI opinion.  - Prescription- Zofran prn

## 2022-08-30 ENCOUNTER — Ambulatory Visit: Payer: Medicare HMO

## 2022-09-01 ENCOUNTER — Telehealth: Payer: Self-pay

## 2022-09-01 NOTE — Patient Outreach (Signed)
  Care Coordination   09/01/2022 Name: [name] MRN: [id number] DOB: 10/28/1956   Care Coordination Outreach Attempts:  A third unsuccessful outreach was attempted today to offer the patient with information about available care coordination services as a benefit of their health plan.   Follow Up Plan:  No further outreach attempts will be made at this time. We have been unable to contact the patient to offer or enroll patient in care coordination services  Encounter Outcome:  No Answer  Care Coordination Interventions Activated:  No   Care Coordination Interventions:  No, not indicated      Jone Baseman, RN, MSN [place] [place] [facility] Care Management Coordinator Direct Line [phone]

## 2022-09-02 ENCOUNTER — Telehealth: Payer: Self-pay

## 2022-09-06 NOTE — Telephone Encounter (Signed)
Spoke with patient to see if she used [employer]. Patient stated that she does not use [employer]. That she only uses [employer]. So I discarded the fax. The patient mentioned that she would like for the doctor to call her about her recent lab results. Salvatore Marvel, CMA

## 2022-09-07 ENCOUNTER — Ambulatory Visit (INDEPENDENT_AMBULATORY_CARE_PROVIDER_SITE_OTHER): Payer: Medicare HMO | Admitting: Family Medicine

## 2022-09-07 VITALS — BP 145/75 | HR 77 | Temp 98.1°F | Ht 63.0 in | Wt 183.6 lb

## 2022-09-07 DIAGNOSIS — R110 Nausea: Secondary | ICD-10-CM

## 2022-09-07 DIAGNOSIS — J069 Acute upper respiratory infection, unspecified: Secondary | ICD-10-CM | POA: Diagnosis not present

## 2022-09-07 MED ORDER — ONDANSETRON HCL 4 MG PO TABS: 4.0000 mg | ORAL_TABLET | Freq: Three times a day (TID) | ORAL | 0 refills | Status: DC | PRN

## 2022-09-07 MED ORDER — BENZONATATE 100 MG PO CAPS: 100.0000 mg | ORAL_CAPSULE | Freq: Two times a day (BID) | ORAL | 0 refills | Status: DC | PRN

## 2022-09-07 NOTE — Assessment & Plan Note (Signed)
2 weeks duration, subacute now.  Suspect COVID given 3 sick contacts in house all tested positive for COVID.  Will obtain swab today.  Discussed no role for medication given duration of symptoms.  Patient amenable.  She would like to pursue COVID vaccination when she is next eligible.  Discussed supportive management at this time.  No evidence of pneumonia or other bacterial infection.  Return precautions given.

## 2022-09-07 NOTE — Patient Instructions (Signed)
It was wonderful to see you today. Thank you for allowing me to be a part of your care. Below is a short summary of what we discussed at your visit today:  Viral URI with cough Had some sort of viral upper respiratory infection.  We will swab today to see if it is COVID.  Given the fact that you have COVID in your house, it is likely [place]. If the results are normal, I will send you a letter or MyChart message. If the results are abnormal, I will give you a call.    Because you are so far out from the onset of symptoms, we would not give you any medication for COVID as you would not get any benefit.  I simply want you to continue with your symptomatic management such as Tylenol and ibuprofen.  Nausea: I have sent in Zofran for use as needed, as often as every 8 hours if need be Cough: I have sent in [facility] for cough.  Of note, children can overdose on this so please keep [place] away from children.  Honey is also excellent at cough suppression.  Reasons to come back for care: - If you develop high fever - If you develop chest pain with coughing - If you have shortness of breath - If you have worse phlegm production with coughing that does not seem to go away    Please bring all of your medications to every appointment!  If you have any questions or concerns, please do not hesitate to contact us via phone or MyChart message.   Ezequiel Essex, MD

## 2022-09-07 NOTE — Progress Notes (Signed)
    SUBJECTIVE:   CHIEF COMPLAINT / HPI:   Cough and congestion Ms. [name] is a pleasant 66 year old woman who presents today with sick symptoms.  She reports 2 weeks of productive cough, congestion, nausea, and rhinorrhea.  3 other people in her household have had similar symptoms and tested positive for COVID.  She took a at home COVID test yesterday, but wants to be retested here today to be sure. She reports decreased p.o. intake.  PERTINENT  PMH / PSH: T2DM, HTN, HLD, venous insufficiency, asthma, GERD  OBJECTIVE:   BP (!) 145/75   Pulse 77   Temp 98.1 F (36.7 C)   Ht '5\' 3"'$  (1.6 m)   Wt 183 lb 9.6 oz (83.3 kg)   SpO2 100%   BMI 32.52 kg/m     PHQ-9:     08/26/2022   11:11 AM 06/10/2022   10:35 AM 03/25/2022    3:31 PM  Depression screen PHQ 2/9  Decreased Interest 0 0 0  Down, Depressed, Hopeless 0 0 0  PHQ - 2 Score 0 0 0  Altered sleeping 1 1 0  Tired, decreased energy 0 1 0  Change in appetite 0 1 0  Feeling bad or failure about yourself  0 0 0  Trouble concentrating 0 0 0  Moving slowly or fidgety/restless 0 0 0  Suicidal thoughts 0 0 0  PHQ-9 Score 1 3 0  Difficult doing work/chores Not difficult at all Not difficult at all Not difficult at all    Physical Exam General: Awake, alert, oriented HEENT: PERRL, bilateral TM pearly pink and flat, bilateral external auditory canals with minimal cerumen burden, no lesions, nasal mucosa slightly edematous, oral mucosa pink, moist, without lesion, intact dentition without obvious cavity Lymph: No palpable lymphedema of head or neck Cardiovascular: Regular rate and rhythm, S1 and S2 present, no murmurs auscultated Respiratory: Lung fields clear to auscultation bilaterally  ASSESSMENT/PLAN:   Viral URI with cough 2 weeks duration, subacute now.  Suspect COVID given 3 sick contacts in house all tested positive for COVID.  Will obtain swab today.  Discussed no role for medication given duration of symptoms.  Patient  amenable.  She would like to pursue COVID vaccination when she is next eligible.  Discussed supportive management at this time.  No evidence of pneumonia or other bacterial infection.  Return precautions given.     Ezequiel Essex, MD [place]

## 2022-09-09 ENCOUNTER — Telehealth: Payer: Self-pay | Admitting: Family Medicine

## 2022-09-09 LAB — NOVEL CORONAVIRUS, NAA

## 2022-09-09 NOTE — Telephone Encounter (Signed)
Called patient to discuss COVID positive result. As she is 2 weeks out from symptom onset, no medication or quarantine. Answered all questions.   Ezequiel Essex, MD

## 2022-09-29 NOTE — Progress Notes (Signed)
[facility])                                            [place] Team                                        Statin Quality Measure Assessment    09/29/2022  DEBAR PLATE 1956/06/15 [id number]  Per review of chart and payor information, this patient has been flagged for non-adherence to the following CMS Quality Measure:   '[x]'$  Statin Use in Persons with Diabetes  '[]'$  Statin Use in Persons with Cardiovascular Disease  The ASCVD Risk score (Arnett DK, et al., 2019) failed to calculate for the following reasons:   Cannot find a previous HDL lab   Cannot find a previous total cholesterol lab  Patient was previously on atorvastatin but it was stopped. Last LDL 45 in 2019 and no updated lipid panel. If deemed clinically appropriate, please consider assessment for statin therapy in diabetic patient. If patient has an undocumented statin intolerance, please consider associating exclusion code, see options below, at the next appointment on 09/30/2022.  Please consider ONE of the following recommendations:   Initiate high intensity statin Atorvastatin '40mg'$  once daily, #90, 3 refills   Rosuvastatin '20mg'$  once daily, #90, 3 refills    Initiate moderate intensity          statin with reduced frequency if prior          statin intolerance 1x weekly, #13, 3 refills   2x weekly, #26, 3 refills   3x weekly, #39, 3 refills   Code for past statin intolerance or other exclusions (required annually)  Drug Induced Myopathy G72.0   Myositis, unspecified M60.9   Rhabdomyolysis M62.82   Cirrhosis of liver K74.69   Biliary cirrhosis, unspecified K74.5   Abnormal blood glucose - for SUPD ONLY R73.09   Prediabetes - for SUPD ONLY  R73.03   Thank you for your time,  Kristeen Miss, [place] Cell: [phone]

## 2022-09-30 ENCOUNTER — Encounter: Payer: Self-pay | Admitting: Family Medicine

## 2022-09-30 ENCOUNTER — Ambulatory Visit (INDEPENDENT_AMBULATORY_CARE_PROVIDER_SITE_OTHER): Payer: Medicare HMO | Admitting: Family Medicine

## 2022-09-30 VITALS — BP 126/89 | HR 76 | Wt 176.4 lb

## 2022-09-30 DIAGNOSIS — Z79899 Other long term (current) drug therapy: Secondary | ICD-10-CM | POA: Diagnosis not present

## 2022-09-30 DIAGNOSIS — E1159 Type 2 diabetes mellitus with other circulatory complications: Secondary | ICD-10-CM | POA: Diagnosis not present

## 2022-09-30 DIAGNOSIS — L309 Dermatitis, unspecified: Secondary | ICD-10-CM

## 2022-09-30 DIAGNOSIS — E669 Obesity, unspecified: Secondary | ICD-10-CM | POA: Diagnosis not present

## 2022-09-30 DIAGNOSIS — E119 Type 2 diabetes mellitus without complications: Secondary | ICD-10-CM

## 2022-09-30 DIAGNOSIS — K2100 Gastro-esophageal reflux disease with esophagitis, without bleeding: Secondary | ICD-10-CM

## 2022-09-30 DIAGNOSIS — E611 Iron deficiency: Secondary | ICD-10-CM

## 2022-09-30 DIAGNOSIS — Z9884 Bariatric surgery status: Secondary | ICD-10-CM

## 2022-09-30 DIAGNOSIS — I872 Venous insufficiency (chronic) (peripheral): Secondary | ICD-10-CM | POA: Diagnosis not present

## 2022-09-30 DIAGNOSIS — I152 Hypertension secondary to endocrine disorders: Secondary | ICD-10-CM

## 2022-09-30 MED ORDER — SUCRALFATE 1 GM/10ML PO SUSP: ORAL | 99 refills | Status: DC

## 2022-09-30 MED ORDER — TRIAMCINOLONE ACETONIDE 0.5 % EX OINT: 1.0000 | TOPICAL_OINTMENT | Freq: Every day | CUTANEOUS | 3 refills | Status: DC

## 2022-09-30 MED ORDER — FERROUS SULFATE 300 MG/6.8ML PO SOLN: 5.0000 mL | Freq: Every day | ORAL | 99 refills | Status: DC

## 2022-09-30 NOTE — Patient Instructions (Addendum)
Make sure to check out at the front desk before you leave.  We are checking your iron levels, along with measures of your nutrition with blood tests today, Dr Salem Mastrogiovanni will send you a MyChart message or a letter if the results are normal.  Otherwise, he will give you a call.   If Dr Etienne Mowers ordered you to have a referral, you should hear from the referral's office to schedule an appointment.    If you have not heard from the referral's office within 5 business-days, please let Dr Darron Stuck know by sending him a message through [place], or calling the [place] [phone]) to leave him a message.   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   Apply the triamcinolone 0.5% ointment to the dry, itchy area one a day.  Spend a good minute or two rubbing the ointment into the skin.  Wait about 10 minutes, then apply a moisturizing cream, not lotion.  Apply the moisturizing cream thre to four times a day, at least, to your hands.   Apply moisturizer each time after hand sanitizing gel used.   If you must put your hands in water, wear gloves.  Your weight continues to decrease.  Continue using the Ozempic 2 mg injections every other week like you have been doing.  If you get nauseous, remember it could be the Ozempic.    Dr Omero Kowal would see you after the holidays, like around the middle of January.

## 2022-10-01 ENCOUNTER — Encounter: Payer: Self-pay | Admitting: Family Medicine

## 2022-10-01 LAB — CMP14+EGFR
ALT: 13 IU/L (ref 0–32)
AST: 16 IU/L (ref 0–40)
Albumin/Globulin Ratio: 1.7 (ref 1.2–2.2)
Albumin: 4.3 g/dL (ref 3.9–4.9)
Alkaline Phosphatase: 83 IU/L (ref 44–121)
BUN/Creatinine Ratio: 22 (ref 12–28)
BUN: 16 mg/dL (ref 8–27)
Bilirubin Total: 0.2 mg/dL (ref 0.0–1.2)
CO2: 20 mmol/L (ref 20–29)
Calcium: 9.6 mg/dL (ref 8.7–10.3)
Chloride: 108 mmol/L — ABNORMAL HIGH (ref 96–106)
Creatinine, Ser: 0.74 mg/dL (ref 0.57–1.00)
Globulin, Total: 2.6 g/dL (ref 1.5–4.5)
Glucose: 79 mg/dL (ref 70–99)
Potassium: 4.1 mmol/L (ref 3.5–5.2)
Sodium: 143 mmol/L (ref 134–144)
Total Protein: 6.9 g/dL (ref 6.0–8.5)
eGFR: 89 mL/min/{1.73_m2} (ref >59)

## 2022-10-01 LAB — CBC
Hematocrit: 37.1 % (ref 34.0–46.6)
Hemoglobin: 12.4 g/dL (ref 11.1–15.9)
MCH: 31.1 pg (ref 26.6–33.0)
MCHC: 33.4 g/dL (ref 31.5–35.7)
MCV: 93 fL (ref 79–97)
Platelets: 241 10*3/uL (ref 150–450)
RBC: 3.99 x10E6/uL (ref 3.77–5.28)
RDW: 12.7 % (ref 11.7–15.4)
WBC: 5.4 10*3/uL (ref 3.4–10.8)

## 2022-10-01 LAB — FERRITIN: Ferritin: 89 ng/mL (ref 15–150)

## 2022-10-01 NOTE — Assessment & Plan Note (Signed)
Established problem No worsening of leg edema on lasix 40 mg 1/2 tab daily   Recommend continue at Lasix 40 mg half tab daily prn,  If no significant worsening, may recommend trial off of Lasix.

## 2022-10-01 NOTE — Assessment & Plan Note (Signed)
Requesting check of iron levels and hgb given history of IDA. Lab Results  Component Value Date   HCT 37.1 09/30/2022  Iron/TIBC/Ferritin/ %Sat    Component Value Date/Time   IRON 25 (L) 11/17/2017 0923   TIBC 531 (H) 11/17/2017 0923   FERRITIN 89 09/30/2022 1410   IRONPCTSAT 5 (LL) 11/17/2017 0923   IRONPCTSAT 11 01/13/2017 0943   Both appear to be in satisfactory ranges. No change in ferrous solution supplementation.

## 2022-10-01 NOTE — Progress Notes (Signed)
[name] is alone Sources of clinical information for visit is/are patient. Nursing assessment for this office visit was reviewed with the patient for accuracy and revision.     Previous Report(s) Reviewed: none     09/30/2022   11:03 AM  Depression screen PHQ 2/9  Decreased Interest 0  Down, Depressed, Hopeless 0  PHQ - 2 Score 0  Altered sleeping 0  Tired, decreased energy 0  Change in appetite 0  Feeling bad or failure about yourself  0  Trouble concentrating 0  Moving slowly or fidgety/restless 0  Suicidal thoughts 0  PHQ-9 Score 0   Flowsheet Row Office Visit from 09/30/2022 in [place] Office Visit from 08/26/2022 in [place] Office Visit from 06/10/2022 in [place]  Thoughts that you would be better off dead, or of hurting yourself in some way Not at all Not at all Not at all  PHQ-9 Total Score 0 1 3          09/30/2022   11:03 AM 08/26/2022   11:12 AM 03/25/2022    3:26 PM 12/17/2021    4:20 PM 07/10/2021   12:38 PM  Fall Risk   Falls in the past year? 1 0 1 1   Number falls in past yr: 0 0 0 1 1  Injury with Fall? 1 0 0  1  Risk for fall due to :    History of fall(s) History of fall(s)       09/30/2022   11:03 AM 08/26/2022   11:11 AM 06/10/2022   10:35 AM  PHQ9 SCORE ONLY  PHQ-9 Total Score 0 1 3    There are no preventive care reminders to display for this patient.  Health Maintenance Due  Topic Date Due   Diabetic kidney evaluation - Urine ACR  Never done   FOOT EXAM  05/21/2022   COVID-19 Vaccine (5 - 2023-24 season) 07/02/2022   Medicare Annual Wellness (AWV)  07/10/2022      History/P.E. limitations: none  There are no preventive care reminders to display for this patient.  Diabetes Health Maintenance Due  Topic Date Due   FOOT EXAM  05/21/2022   OPHTHALMOLOGY EXAM  01/14/2023   HEMOGLOBIN A1C  02/25/2023    Health Maintenance Due  Topic Date Due    Diabetic kidney evaluation - Urine ACR  Never done   FOOT EXAM  05/21/2022   COVID-19 Vaccine (5 - 2023-24 season) 07/02/2022   Medicare Annual Wellness (AWV)  07/10/2022     Chief Complaint  Patient presents with   Follow-up    LOV     --------------------------------------------------------------------------------------------------------------------------------------------- Visit Problem List with A/P  No problem-specific Assessment & Plan notes found for this encounter.

## 2022-10-01 NOTE — Assessment & Plan Note (Signed)
Established problem. Adequate blood pressure control.  No evidence of new end organ damage.  Tolerating medication without significant adverse effects.  Plan to continue current blood pressure medication regiment.   

## 2022-10-01 NOTE — Assessment & Plan Note (Signed)
Established problem Lab Results  Component Value Date   HGBA1C 5.2 08/26/2022    Well Controlled and is at goal of glycemic contro even with self-directed change in semaglutide 2 mg [place] every other week dose. No further nausea with dosing this way and weight reduction has continued with this idiopathic schedule.  No signs of complications, medication side effects, or red flags. Continue current medications and other regiments.

## 2022-10-01 NOTE — Assessment & Plan Note (Addendum)
Wt Readings from Last 3 Encounters:  09/30/22 176 lb 6 oz (80 kg)  09/07/22 183 lb 9.6 oz (83.3 kg)  08/26/22 183 lb 2 oz (83.1 kg)  Weight reduction continued on patient's self-directed dosing of semaglutide 2 mg [place] every other week. [name] would like to get her weight below 170 lb.

## 2022-10-04 ENCOUNTER — Ambulatory Visit: Payer: Medicare HMO | Admitting: Podiatry

## 2022-10-11 ENCOUNTER — Ambulatory Visit: Payer: Medicare HMO | Admitting: Podiatry

## 2022-10-11 ENCOUNTER — Encounter: Payer: Self-pay | Admitting: Podiatry

## 2022-10-11 DIAGNOSIS — M79675 Pain in left toe(s): Secondary | ICD-10-CM | POA: Diagnosis not present

## 2022-10-11 DIAGNOSIS — B351 Tinea unguium: Secondary | ICD-10-CM

## 2022-10-11 DIAGNOSIS — M2041 Other hammer toe(s) (acquired), right foot: Secondary | ICD-10-CM

## 2022-10-11 DIAGNOSIS — M79674 Pain in right toe(s): Secondary | ICD-10-CM

## 2022-10-11 NOTE — Progress Notes (Signed)
Subjective:   Patient ID: [name], female   DOB: 66 y.o.   MRN: [id number]   HPI Patient presents with elongated nailbeds 1-5 both feet that are painful and a lesion third digit right that is painful when pressed and makes wearing shoes difficult and feels like the toe is in a different position   ROS      Objective:  Physical Exam  Neurovascular status intact with thick yellow brittle nailbeds 1-5 both feet that are incurvated and keratotic lesion with pain third digit right foot     Assessment:  What appears to be a hammertoe deformity third right with digital deformity along with mycotic nail infection 1-5 both feet painful with pressure     Plan:  H&P reviewed condition and recommended debridement and nailbed which was accomplished today discussed that hammertoe and reviewed arthroplasty which may be necessary in future.  I educated her on what arthroplasty would entail she like to get this done at 1 point but at this point organ to hold off I debrided the lesion and applied cushioning answered all questions concerning surgery that might be necessary depending on response to conservative care

## 2022-11-03 ENCOUNTER — Other Ambulatory Visit: Payer: Self-pay | Admitting: Family Medicine

## 2022-11-24 ENCOUNTER — Emergency Department (HOSPITAL_COMMUNITY): Payer: Medicare HMO

## 2022-11-24 ENCOUNTER — Emergency Department (HOSPITAL_COMMUNITY)
Admission: EM | Admit: 2022-11-24 | Discharge: 2022-11-24 | Disposition: A | Discharge status: Home / Self Care | Payer: Medicare HMO | Attending: Emergency Medicine | Admitting: Emergency Medicine

## 2022-11-24 DIAGNOSIS — M25562 Pain in left knee: Secondary | ICD-10-CM | POA: Insufficient documentation

## 2022-11-24 DIAGNOSIS — M25561 Pain in right knee: Secondary | ICD-10-CM | POA: Diagnosis not present

## 2022-11-24 DIAGNOSIS — M542 Cervicalgia: Secondary | ICD-10-CM | POA: Insufficient documentation

## 2022-11-24 DIAGNOSIS — R1084 Generalized abdominal pain: Secondary | ICD-10-CM | POA: Diagnosis not present

## 2022-11-24 DIAGNOSIS — M50321 Other cervical disc degeneration at C4-C5 level: Secondary | ICD-10-CM | POA: Diagnosis not present

## 2022-11-24 DIAGNOSIS — Z041 Encounter for examination and observation following transport accident: Secondary | ICD-10-CM | POA: Diagnosis not present

## 2022-11-24 DIAGNOSIS — S0990XA Unspecified injury of head, initial encounter: Secondary | ICD-10-CM | POA: Diagnosis not present

## 2022-11-24 DIAGNOSIS — K7689 Other specified diseases of liver: Secondary | ICD-10-CM | POA: Diagnosis not present

## 2022-11-24 DIAGNOSIS — S199XXA Unspecified injury of neck, initial encounter: Secondary | ICD-10-CM | POA: Diagnosis not present

## 2022-11-24 DIAGNOSIS — I10 Essential (primary) hypertension: Secondary | ICD-10-CM | POA: Diagnosis not present

## 2022-11-24 DIAGNOSIS — Z7982 Long term (current) use of aspirin: Secondary | ICD-10-CM | POA: Diagnosis not present

## 2022-11-24 DIAGNOSIS — R918 Other nonspecific abnormal finding of lung field: Secondary | ICD-10-CM | POA: Diagnosis not present

## 2022-11-24 DIAGNOSIS — R519 Headache, unspecified: Secondary | ICD-10-CM | POA: Insufficient documentation

## 2022-11-24 DIAGNOSIS — K5730 Diverticulosis of large intestine without perforation or abscess without bleeding: Secondary | ICD-10-CM | POA: Diagnosis not present

## 2022-11-24 DIAGNOSIS — Y92410 Unspecified street and highway as the place of occurrence of the external cause: Secondary | ICD-10-CM | POA: Insufficient documentation

## 2022-11-24 DIAGNOSIS — R600 Localized edema: Secondary | ICD-10-CM | POA: Diagnosis not present

## 2022-11-24 DIAGNOSIS — S80919A Unspecified superficial injury of unspecified knee, initial encounter: Secondary | ICD-10-CM | POA: Diagnosis not present

## 2022-11-24 LAB — BASIC METABOLIC PANEL
Anion gap: 10 (ref 5–15)
BUN: 15 mg/dL (ref 8–23)
CO2: 22 mmol/L (ref 22–32)
Calcium: 8.6 mg/dL — ABNORMAL LOW (ref 8.9–10.3)
Chloride: 109 mmol/L (ref 98–111)
Creatinine, Ser: 0.78 mg/dL (ref 0.44–1.00)
GFR, Estimated: >60 mL/min (ref >60)
Glucose, Bld: 97 mg/dL (ref 70–99)
Potassium: 3.4 mmol/L — ABNORMAL LOW (ref 3.5–5.1)
Sodium: 141 mmol/L (ref 135–145)

## 2022-11-24 LAB — CBC WITH DIFFERENTIAL/PLATELET
Abs Immature Granulocytes: 0.01 10*3/uL (ref 0.00–0.07)
Basophils Absolute: 0 10*3/uL (ref 0.0–0.1)
Basophils Relative: 0 %
Eosinophils Absolute: 0.1 10*3/uL (ref 0.0–0.5)
Eosinophils Relative: 2 %
HCT: 36.7 % (ref 36.0–46.0)
Hemoglobin: 11.5 g/dL — ABNORMAL LOW (ref 12.0–15.0)
Immature Granulocytes: 0 %
Lymphocytes Relative: 25 %
Lymphs Abs: 1.2 10*3/uL (ref 0.7–4.0)
MCH: 30.7 pg (ref 26.0–34.0)
MCHC: 31.3 g/dL (ref 30.0–36.0)
MCV: 98.1 fL (ref 80.0–100.0)
Monocytes Absolute: 0.4 10*3/uL (ref 0.1–1.0)
Monocytes Relative: 9 %
Neutro Abs: 2.9 10*3/uL (ref 1.7–7.7)
Neutrophils Relative %: 64 %
Platelets: 222 10*3/uL (ref 150–400)
RBC: 3.74 MIL/uL — ABNORMAL LOW (ref 3.87–5.11)
RDW: 13.8 % (ref 11.5–15.5)
WBC: 4.7 10*3/uL (ref 4.0–10.5)
nRBC: 0 % (ref 0.0–0.2)

## 2022-11-24 LAB — I-STAT CHEM 8, ED
BUN: 16 mg/dL (ref 8–23)
Calcium, Ion: 1.11 mmol/L — ABNORMAL LOW (ref 1.15–1.40)
Chloride: 110 mmol/L (ref 98–111)
Creatinine, Ser: 0.5 mg/dL (ref 0.44–1.00)
Glucose, Bld: 88 mg/dL (ref 70–99)
HCT: 34 % — ABNORMAL LOW (ref 36.0–46.0)
Hemoglobin: 11.6 g/dL — ABNORMAL LOW (ref 12.0–15.0)
Potassium: 3.4 mmol/L — ABNORMAL LOW (ref 3.5–5.1)
Sodium: 141 mmol/L (ref 135–145)
TCO2: 22 mmol/L (ref 22–32)

## 2022-11-24 LAB — TYPE AND SCREEN
ABO/RH(D): O POS
Antibody Screen: NEGATIVE

## 2022-11-24 MED ORDER — MORPHINE SULFATE (PF) 4 MG/ML IV SOLN
4.0000 mg | Freq: Once | INTRAVENOUS | Status: AC
  Administered 2022-11-24: 4 mg via INTRAVENOUS
  Filled 2022-11-24: qty 1

## 2022-11-24 MED ORDER — MORPHINE SULFATE (PF) 2 MG/ML IV SOLN
2.0000 mg | Freq: Once | INTRAVENOUS | Status: AC
  Administered 2022-11-24: 2 mg via INTRAVENOUS
  Filled 2022-11-24: qty 1

## 2022-11-24 MED ORDER — HYDROCODONE-ACETAMINOPHEN 5-325 MG PO TABS: 2.0000 | ORAL_TABLET | ORAL | 0 refills | Status: DC | PRN

## 2022-11-24 MED ORDER — IOHEXOL 350 MG/ML SOLN
75.0000 mL | Freq: Once | INTRAVENOUS | Status: AC | PRN
  Administered 2022-11-24: 75 mL via INTRAVENOUS

## 2022-11-24 NOTE — Discharge Instructions (Addendum)
Return for any problem.  ?
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
## 2022-11-25 ENCOUNTER — Telehealth: Payer: Self-pay

## 2022-11-25 DIAGNOSIS — S8001XA Contusion of right knee, initial encounter: Secondary | ICD-10-CM | POA: Diagnosis not present

## 2022-11-25 DIAGNOSIS — T792XXA Traumatic secondary and recurrent hemorrhage and seroma, initial encounter: Secondary | ICD-10-CM | POA: Diagnosis not present

## 2022-11-25 DIAGNOSIS — S8002XA Contusion of left knee, initial encounter: Secondary | ICD-10-CM | POA: Diagnosis not present
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
## 2022-12-01 ENCOUNTER — Telehealth: Payer: Self-pay
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
## 2022-12-02 ENCOUNTER — Encounter: Payer: Self-pay | Admitting: Family Medicine

## 2022-12-02 ENCOUNTER — Ambulatory Visit (INDEPENDENT_AMBULATORY_CARE_PROVIDER_SITE_OTHER): Payer: Medicare HMO | Admitting: Family Medicine

## 2022-12-02 VITALS — BP 134/89 | HR 90 | Ht 63.0 in | Wt 183.2 lb

## 2022-12-02 DIAGNOSIS — S8001XA Contusion of right knee, initial encounter: Secondary | ICD-10-CM

## 2022-12-02 DIAGNOSIS — E785 Hyperlipidemia, unspecified: Secondary | ICD-10-CM

## 2022-12-02 DIAGNOSIS — S8002XA Contusion of left knee, initial encounter: Secondary | ICD-10-CM | POA: Diagnosis not present

## 2022-12-02 DIAGNOSIS — E1169 Type 2 diabetes mellitus with other specified complication: Secondary | ICD-10-CM | POA: Diagnosis not present

## 2022-12-02 DIAGNOSIS — E119 Type 2 diabetes mellitus without complications: Secondary | ICD-10-CM | POA: Diagnosis not present

## 2022-12-02 LAB — POCT GLYCOSYLATED HEMOGLOBIN (HGB A1C): HbA1c, POC (controlled diabetic range): 5.3 % (ref 0.0–7.0)

## 2022-12-02 MED ORDER — HYDROCODONE-ACETAMINOPHEN 5-300 MG PO TABS: 1.0000 | ORAL_TABLET | Freq: Four times a day (QID) | ORAL | 0 refills | Status: AC | PRN

## 2022-12-02 NOTE — Patient Instructions (Addendum)
You appear to have a severe contusion to your right knee and contusion to your left knee that is not quite as severe as on the right.   There are area of skin on your right knee that are superficial injuries that may slough off the top layer of skin.  I recommend applying Vaseline to the skin of your right knee to help the injured skin to heal.    Finish the Prednisone tablets as Dr Debroah Loop office directed you.  Should you develop fever or shaking chills, Shortness of Breath, let our office know.

## 2022-12-03 ENCOUNTER — Telehealth: Payer: Self-pay

## 2022-12-03 ENCOUNTER — Encounter: Payer: Self-pay | Admitting: Family Medicine

## 2022-12-03 DIAGNOSIS — S8002XA Contusion of left knee, initial encounter: Secondary | ICD-10-CM

## 2022-12-03 DIAGNOSIS — S8002XD Contusion of left knee, subsequent encounter: Secondary | ICD-10-CM | POA: Diagnosis not present

## 2022-12-03 DIAGNOSIS — S8001XA Contusion of right knee, initial encounter: Secondary | ICD-10-CM

## 2022-12-03 HISTORY — DX: Contusion of left knee, initial encounter: S80.02XA

## 2022-12-03 HISTORY — DX: Contusion of right knee, initial encounter: S80.01XA

## 2022-12-03 MED ORDER — ATORVASTATIN CALCIUM 40 MG PO TABS: 40.0000 mg | ORAL_TABLET | Freq: Every day | ORAL | 3 refills | Status: DC
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
## 2022-12-03 NOTE — Assessment & Plan Note (Addendum)
Lab Results  Component Value Date   HGBA1C 5.3 12/02/2022   Established problem Well Controlled and is at goal of < 6.5%. No signs of complications, medication side effects, or red flags. Continue current medications and other regiments.
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
## 2022-12-03 NOTE — Assessment & Plan Note (Signed)
Checking lipid panel.  Atorvastin 40 mg daily, #90,  refilled for year
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
## 2022-12-06 ENCOUNTER — Other Ambulatory Visit: Payer: Self-pay | Admitting: Family Medicine

## 2022-12-06 ENCOUNTER — Other Ambulatory Visit: Payer: Self-pay | Admitting: Internal Medicine

## 2022-12-10 ENCOUNTER — Other Ambulatory Visit: Payer: Self-pay

## 2022-12-10 DIAGNOSIS — I872 Venous insufficiency (chronic) (peripheral): Secondary | ICD-10-CM

## 2022-12-10 DIAGNOSIS — S8001XD Contusion of right knee, subsequent encounter: Secondary | ICD-10-CM | POA: Diagnosis not present

## 2022-12-10 MED ORDER — FUROSEMIDE 40 MG PO TABS: 20.0000 mg | ORAL_TABLET | Freq: Every day | ORAL | 2 refills | Status: DC | PRN

## 2022-12-17 ENCOUNTER — Emergency Department (HOSPITAL_COMMUNITY): Payer: Medicare HMO

## 2022-12-17 ENCOUNTER — Other Ambulatory Visit: Payer: Self-pay

## 2022-12-17 ENCOUNTER — Encounter (HOSPITAL_COMMUNITY): Payer: Self-pay

## 2022-12-17 ENCOUNTER — Inpatient Hospital Stay (HOSPITAL_COMMUNITY)
Admission: EM | Admit: 2022-12-17 | Discharge: 2022-12-24 | DRG: 578 | Disposition: A | Discharge status: Home / Self Care | Payer: Medicare HMO | Attending: Family Medicine | Admitting: Family Medicine

## 2022-12-17 DIAGNOSIS — M47812 Spondylosis without myelopathy or radiculopathy, cervical region: Secondary | ICD-10-CM | POA: Diagnosis present

## 2022-12-17 DIAGNOSIS — S8001XD Contusion of right knee, subsequent encounter: Principal | ICD-10-CM

## 2022-12-17 DIAGNOSIS — Z96653 Presence of artificial knee joint, bilateral: Secondary | ICD-10-CM | POA: Diagnosis present

## 2022-12-17 DIAGNOSIS — K219 Gastro-esophageal reflux disease without esophagitis: Secondary | ICD-10-CM | POA: Diagnosis present

## 2022-12-17 DIAGNOSIS — E1159 Type 2 diabetes mellitus with other circulatory complications: Secondary | ICD-10-CM | POA: Diagnosis present

## 2022-12-17 DIAGNOSIS — M25569 Pain in unspecified knee: Secondary | ICD-10-CM | POA: Diagnosis present

## 2022-12-17 DIAGNOSIS — Z87891 Personal history of nicotine dependence: Secondary | ICD-10-CM | POA: Diagnosis not present

## 2022-12-17 DIAGNOSIS — Z9884 Bariatric surgery status: Secondary | ICD-10-CM

## 2022-12-17 DIAGNOSIS — S81809A Unspecified open wound, unspecified lower leg, initial encounter: Secondary | ICD-10-CM | POA: Diagnosis present

## 2022-12-17 DIAGNOSIS — E119 Type 2 diabetes mellitus without complications: Secondary | ICD-10-CM | POA: Diagnosis present

## 2022-12-17 DIAGNOSIS — Y92410 Unspecified street and highway as the place of occurrence of the external cause: Secondary | ICD-10-CM | POA: Diagnosis not present

## 2022-12-17 DIAGNOSIS — S8001XA Contusion of right knee, initial encounter: Secondary | ICD-10-CM | POA: Diagnosis present

## 2022-12-17 DIAGNOSIS — T148XXA Other injury of unspecified body region, initial encounter: Secondary | ICD-10-CM | POA: Diagnosis not present

## 2022-12-17 DIAGNOSIS — Z8673 Personal history of transient ischemic attack (TIA), and cerebral infarction without residual deficits: Secondary | ICD-10-CM | POA: Diagnosis not present

## 2022-12-17 DIAGNOSIS — R7303 Prediabetes: Secondary | ICD-10-CM | POA: Diagnosis present

## 2022-12-17 DIAGNOSIS — Z888 Allergy status to other drugs, medicaments and biological substances status: Secondary | ICD-10-CM | POA: Diagnosis not present

## 2022-12-17 DIAGNOSIS — Z0389 Encounter for observation for other suspected diseases and conditions ruled out: Secondary | ICD-10-CM | POA: Diagnosis not present

## 2022-12-17 DIAGNOSIS — E876 Hypokalemia: Secondary | ICD-10-CM | POA: Diagnosis present

## 2022-12-17 DIAGNOSIS — J45909 Unspecified asthma, uncomplicated: Secondary | ICD-10-CM | POA: Diagnosis present

## 2022-12-17 DIAGNOSIS — Z79899 Other long term (current) drug therapy: Secondary | ICD-10-CM | POA: Diagnosis not present

## 2022-12-17 DIAGNOSIS — Z886 Allergy status to analgesic agent status: Secondary | ICD-10-CM | POA: Diagnosis not present

## 2022-12-17 DIAGNOSIS — E785 Hyperlipidemia, unspecified: Secondary | ICD-10-CM | POA: Diagnosis present

## 2022-12-17 DIAGNOSIS — I10 Essential (primary) hypertension: Secondary | ICD-10-CM | POA: Diagnosis present

## 2022-12-17 DIAGNOSIS — E118 Type 2 diabetes mellitus with unspecified complications: Secondary | ICD-10-CM | POA: Diagnosis present

## 2022-12-17 DIAGNOSIS — T8149XA Infection following a procedure, other surgical site, initial encounter: Secondary | ICD-10-CM | POA: Diagnosis not present

## 2022-12-17 DIAGNOSIS — M25461 Effusion, right knee: Secondary | ICD-10-CM | POA: Diagnosis not present

## 2022-12-17 DIAGNOSIS — R600 Localized edema: Secondary | ICD-10-CM | POA: Diagnosis not present

## 2022-12-17 DIAGNOSIS — M25561 Pain in right knee: Secondary | ICD-10-CM | POA: Diagnosis not present

## 2022-12-17 DIAGNOSIS — L02415 Cutaneous abscess of right lower limb: Secondary | ICD-10-CM | POA: Diagnosis present

## 2022-12-17 DIAGNOSIS — I771 Stricture of artery: Secondary | ICD-10-CM | POA: Diagnosis not present

## 2022-12-17 DIAGNOSIS — S81001A Unspecified open wound, right knee, initial encounter: Secondary | ICD-10-CM | POA: Diagnosis not present

## 2022-12-17 DIAGNOSIS — L089 Local infection of the skin and subcutaneous tissue, unspecified: Secondary | ICD-10-CM | POA: Diagnosis present

## 2022-12-17 DIAGNOSIS — Z96651 Presence of right artificial knee joint: Secondary | ICD-10-CM | POA: Diagnosis not present

## 2022-12-17 DIAGNOSIS — S8991XA Unspecified injury of right lower leg, initial encounter: Secondary | ICD-10-CM | POA: Diagnosis not present

## 2022-12-17 DIAGNOSIS — M7989 Other specified soft tissue disorders: Secondary | ICD-10-CM | POA: Diagnosis not present

## 2022-12-17 DIAGNOSIS — D649 Anemia, unspecified: Secondary | ICD-10-CM | POA: Diagnosis not present

## 2022-12-17 LAB — CBC WITH DIFFERENTIAL/PLATELET
Abs Immature Granulocytes: 0.02 10*3/uL (ref 0.00–0.07)
Basophils Absolute: 0 10*3/uL (ref 0.0–0.1)
Basophils Relative: 0 %
Eosinophils Absolute: 0.1 10*3/uL (ref 0.0–0.5)
Eosinophils Relative: 1 %
HCT: 31.4 % — ABNORMAL LOW (ref 36.0–46.0)
Hemoglobin: 10.2 g/dL — ABNORMAL LOW (ref 12.0–15.0)
Immature Granulocytes: 0 %
Lymphocytes Relative: 13 %
Lymphs Abs: 0.9 10*3/uL (ref 0.7–4.0)
MCH: 31.9 pg (ref 26.0–34.0)
MCHC: 32.5 g/dL (ref 30.0–36.0)
MCV: 98.1 fL (ref 80.0–100.0)
Monocytes Absolute: 0.7 10*3/uL (ref 0.1–1.0)
Monocytes Relative: 10 %
Neutro Abs: 5.6 10*3/uL (ref 1.7–7.7)
Neutrophils Relative %: 76 %
Platelets: 220 10*3/uL (ref 150–400)
RBC: 3.2 MIL/uL — ABNORMAL LOW (ref 3.87–5.11)
RDW: 14.2 % (ref 11.5–15.5)
WBC: 7.3 10*3/uL (ref 4.0–10.5)
nRBC: 0 % (ref 0.0–0.2)

## 2022-12-17 LAB — COMPREHENSIVE METABOLIC PANEL
ALT: 31 U/L (ref 0–44)
AST: 23 U/L (ref 15–41)
Albumin: 3.4 g/dL — ABNORMAL LOW (ref 3.5–5.0)
Alkaline Phosphatase: 58 U/L (ref 38–126)
Anion gap: 12 (ref 5–15)
BUN: 11 mg/dL (ref 8–23)
CO2: 21 mmol/L — ABNORMAL LOW (ref 22–32)
Calcium: 9.1 mg/dL (ref 8.9–10.3)
Chloride: 106 mmol/L (ref 98–111)
Creatinine, Ser: 0.8 mg/dL (ref 0.44–1.00)
GFR, Estimated: >60 mL/min (ref >60)
Glucose, Bld: 94 mg/dL (ref 70–99)
Potassium: 3.5 mmol/L (ref 3.5–5.1)
Sodium: 139 mmol/L (ref 135–145)
Total Bilirubin: 0.6 mg/dL (ref 0.3–1.2)
Total Protein: 6.6 g/dL (ref 6.5–8.1)

## 2022-12-17 LAB — PROTIME-INR
INR: 1.1 (ref 0.8–1.2)
Prothrombin Time: 13.8 seconds (ref 11.4–15.2)

## 2022-12-17 LAB — LACTIC ACID, PLASMA: Lactic Acid, Venous: 0.9 mmol/L (ref 0.5–1.9)

## 2022-12-17 MED ORDER — SODIUM CHLORIDE 0.9 % IV BOLUS
1000.0000 mL | Freq: Once | INTRAVENOUS | Status: AC
  Administered 2022-12-18: 1000 mL via INTRAVENOUS

## 2022-12-17 MED ORDER — CLINDAMYCIN PHOSPHATE 600 MG/50ML IV SOLN
600.0000 mg | Freq: Once | INTRAVENOUS | Status: AC
  Administered 2022-12-18: 600 mg via INTRAVENOUS
  Filled 2022-12-17: qty 50

## 2022-12-17 MED ORDER — PIPERACILLIN-TAZOBACTAM 3.375 G IVPB 30 MIN
3.3750 g | Freq: Once | INTRAVENOUS | Status: AC
  Administered 2022-12-18: 3.375 g via INTRAVENOUS
  Filled 2022-12-17: qty 50

## 2022-12-17 MED ORDER — VANCOMYCIN HCL IN DEXTROSE 1-5 GM/200ML-% IV SOLN
1000.0000 mg | Freq: Once | INTRAVENOUS | Status: AC
  Administered 2022-12-18: 1000 mg via INTRAVENOUS
  Filled 2022-12-17: qty 200

## 2022-12-17 MED ORDER — SODIUM CHLORIDE 0.9 % IV SOLN: INTRAVENOUS | Status: DC
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
## 2022-12-18 ENCOUNTER — Observation Stay (HOSPITAL_COMMUNITY): Payer: Medicare HMO

## 2022-12-18 ENCOUNTER — Emergency Department (HOSPITAL_COMMUNITY): Payer: Medicare HMO

## 2022-12-18 DIAGNOSIS — Z886 Allergy status to analgesic agent status: Secondary | ICD-10-CM | POA: Diagnosis not present

## 2022-12-18 DIAGNOSIS — S8001XA Contusion of right knee, initial encounter: Secondary | ICD-10-CM | POA: Diagnosis present

## 2022-12-18 DIAGNOSIS — L089 Local infection of the skin and subcutaneous tissue, unspecified: Secondary | ICD-10-CM

## 2022-12-18 DIAGNOSIS — M25561 Pain in right knee: Secondary | ICD-10-CM | POA: Diagnosis not present

## 2022-12-18 DIAGNOSIS — Z79899 Other long term (current) drug therapy: Secondary | ICD-10-CM | POA: Diagnosis not present

## 2022-12-18 DIAGNOSIS — Y92410 Unspecified street and highway as the place of occurrence of the external cause: Secondary | ICD-10-CM | POA: Diagnosis not present

## 2022-12-18 DIAGNOSIS — T148XXA Other injury of unspecified body region, initial encounter: Secondary | ICD-10-CM

## 2022-12-18 DIAGNOSIS — T8149XA Infection following a procedure, other surgical site, initial encounter: Secondary | ICD-10-CM | POA: Diagnosis not present

## 2022-12-18 DIAGNOSIS — E119 Type 2 diabetes mellitus without complications: Secondary | ICD-10-CM | POA: Diagnosis present

## 2022-12-18 DIAGNOSIS — Z8673 Personal history of transient ischemic attack (TIA), and cerebral infarction without residual deficits: Secondary | ICD-10-CM | POA: Diagnosis not present

## 2022-12-18 DIAGNOSIS — E876 Hypokalemia: Secondary | ICD-10-CM | POA: Diagnosis present

## 2022-12-18 DIAGNOSIS — Z96653 Presence of artificial knee joint, bilateral: Secondary | ICD-10-CM

## 2022-12-18 DIAGNOSIS — E785 Hyperlipidemia, unspecified: Secondary | ICD-10-CM | POA: Diagnosis present

## 2022-12-18 DIAGNOSIS — Z888 Allergy status to other drugs, medicaments and biological substances status: Secondary | ICD-10-CM | POA: Diagnosis not present

## 2022-12-18 DIAGNOSIS — M25461 Effusion, right knee: Secondary | ICD-10-CM | POA: Diagnosis not present

## 2022-12-18 DIAGNOSIS — L02415 Cutaneous abscess of right lower limb: Secondary | ICD-10-CM | POA: Diagnosis present

## 2022-12-18 DIAGNOSIS — M25569 Pain in unspecified knee: Secondary | ICD-10-CM | POA: Diagnosis present

## 2022-12-18 DIAGNOSIS — M7989 Other specified soft tissue disorders: Secondary | ICD-10-CM

## 2022-12-18 DIAGNOSIS — D649 Anemia, unspecified: Secondary | ICD-10-CM | POA: Diagnosis not present

## 2022-12-18 DIAGNOSIS — I10 Essential (primary) hypertension: Secondary | ICD-10-CM | POA: Diagnosis present

## 2022-12-18 DIAGNOSIS — S8991XA Unspecified injury of right lower leg, initial encounter: Secondary | ICD-10-CM | POA: Diagnosis not present

## 2022-12-18 DIAGNOSIS — S81809A Unspecified open wound, unspecified lower leg, initial encounter: Secondary | ICD-10-CM | POA: Diagnosis not present

## 2022-12-18 DIAGNOSIS — S81001A Unspecified open wound, right knee, initial encounter: Secondary | ICD-10-CM | POA: Diagnosis not present

## 2022-12-18 DIAGNOSIS — Z96651 Presence of right artificial knee joint: Secondary | ICD-10-CM | POA: Diagnosis not present

## 2022-12-18 DIAGNOSIS — Z87891 Personal history of nicotine dependence: Secondary | ICD-10-CM | POA: Diagnosis not present

## 2022-12-18 DIAGNOSIS — Z9884 Bariatric surgery status: Secondary | ICD-10-CM | POA: Diagnosis not present

## 2022-12-18 DIAGNOSIS — K219 Gastro-esophageal reflux disease without esophagitis: Secondary | ICD-10-CM | POA: Diagnosis present

## 2022-12-18 DIAGNOSIS — M47812 Spondylosis without myelopathy or radiculopathy, cervical region: Secondary | ICD-10-CM | POA: Diagnosis present

## 2022-12-18 DIAGNOSIS — J45909 Unspecified asthma, uncomplicated: Secondary | ICD-10-CM | POA: Diagnosis present

## 2022-12-18 HISTORY — DX: Local infection of the skin and subcutaneous tissue, unspecified: L08.9

## 2022-12-18 HISTORY — DX: Presence of artificial knee joint, bilateral: Z96.653

## 2022-12-18 LAB — LACTIC ACID, PLASMA: Lactic Acid, Venous: 0.9 mmol/L (ref 0.5–1.9)

## 2022-12-18 LAB — HIV ANTIBODY (ROUTINE TESTING W REFLEX): HIV Screen 4th Generation wRfx: NONREACTIVE

## 2022-12-18 MED ORDER — CLINDAMYCIN PHOSPHATE 600 MG/50ML IV SOLN: 600.0000 mg | Freq: Three times a day (TID) | INTRAVENOUS | Status: DC

## 2022-12-18 MED ORDER — ATORVASTATIN CALCIUM 40 MG PO TABS
40.0000 mg | ORAL_TABLET | Freq: Every day | ORAL | Status: DC
  Administered 2022-12-18 – 2022-12-23 (×6): 40 mg via ORAL
  Filled 2022-12-18 (×6): qty 1

## 2022-12-18 MED ORDER — PIPERACILLIN-TAZOBACTAM 3.375 G IVPB 30 MIN
3.3750 g | Freq: Once | INTRAVENOUS | Status: AC
  Administered 2022-12-18: 3.375 g via INTRAVENOUS
  Filled 2022-12-18 (×2): qty 50

## 2022-12-18 MED ORDER — ENOXAPARIN SODIUM 40 MG/0.4ML IJ SOSY
40.0000 mg | PREFILLED_SYRINGE | INTRAMUSCULAR | Status: DC
  Administered 2022-12-18 – 2022-12-19 (×2): 40 mg via SUBCUTANEOUS
  Filled 2022-12-18 (×2): qty 0.4

## 2022-12-18 MED ORDER — SENNA 8.6 MG PO TABS
1.0000 | ORAL_TABLET | Freq: Every day | ORAL | Status: DC
  Administered 2022-12-18 – 2022-12-23 (×3): 8.6 mg via ORAL
  Filled 2022-12-18 (×6): qty 1

## 2022-12-18 MED ORDER — HYDROCHLOROTHIAZIDE 12.5 MG PO TABS
12.5000 mg | ORAL_TABLET | Freq: Every day | ORAL | Status: DC
  Administered 2022-12-18 – 2022-12-24 (×7): 12.5 mg via ORAL
  Filled 2022-12-18 (×7): qty 1

## 2022-12-18 MED ORDER — VITAMIN B-12 1000 MCG PO TABS
1000.0000 ug | ORAL_TABLET | Freq: Every day | ORAL | Status: DC
  Administered 2022-12-18 – 2022-12-24 (×7): 1000 ug via ORAL
  Filled 2022-12-18 (×7): qty 1

## 2022-12-18 MED ORDER — IOHEXOL 350 MG/ML SOLN
75.0000 mL | Freq: Once | INTRAVENOUS | Status: AC | PRN
  Administered 2022-12-18: 75 mL via INTRAVENOUS

## 2022-12-18 MED ORDER — VANCOMYCIN HCL IN DEXTROSE 1-5 GM/200ML-% IV SOLN
1000.0000 mg | Freq: Once | INTRAVENOUS | Status: AC
  Administered 2022-12-18: 1000 mg via INTRAVENOUS
  Filled 2022-12-18: qty 200

## 2022-12-18 MED ORDER — PIPERACILLIN-TAZOBACTAM 3.375 G IVPB 30 MIN: 3.3750 g | Freq: Four times a day (QID) | INTRAVENOUS | Status: DC

## 2022-12-18 MED ORDER — OXYCODONE HCL 5 MG PO TABS
5.0000 mg | ORAL_TABLET | ORAL | Status: DC | PRN
  Administered 2022-12-18 – 2022-12-24 (×23): 5 mg via ORAL
  Filled 2022-12-18 (×24): qty 1

## 2022-12-18 MED ORDER — CLINDAMYCIN PHOSPHATE 600 MG/50ML IV SOLN
600.0000 mg | Freq: Once | INTRAVENOUS | Status: AC
  Administered 2022-12-18: 600 mg via INTRAVENOUS
  Filled 2022-12-18: qty 50

## 2022-12-18 MED ORDER — PIPERACILLIN-TAZOBACTAM 3.375 G IVPB 30 MIN: 3.3750 g | Freq: Once | INTRAVENOUS | Status: DC

## 2022-12-18 MED ORDER — IRBESARTAN 75 MG PO TABS
37.5000 mg | ORAL_TABLET | Freq: Every day | ORAL | Status: DC
  Administered 2022-12-18 – 2022-12-24 (×7): 37.5 mg via ORAL
  Filled 2022-12-18 (×7): qty 0.5

## 2022-12-18 MED ORDER — FENTANYL CITRATE PF 50 MCG/ML IJ SOSY
50.0000 ug | PREFILLED_SYRINGE | Freq: Once | INTRAMUSCULAR | Status: AC
  Administered 2022-12-18: 50 ug via INTRAVENOUS
  Filled 2022-12-18: qty 1

## 2022-12-18 MED ORDER — CLINDAMYCIN PHOSPHATE 600 MG/50ML IV SOLN: 600.0000 mg | Freq: Once | INTRAVENOUS | Status: DC

## 2022-12-18 MED ORDER — LIDOCAINE HCL (PF) 2 % IJ SOLN
10.0000 mL | Freq: Once | INTRAMUSCULAR | Status: DC | PRN
  Filled 2022-12-18 (×2): qty 10

## 2022-12-18 MED ORDER — ACETAMINOPHEN 325 MG PO TABS
650.0000 mg | ORAL_TABLET | Freq: Four times a day (QID) | ORAL | Status: DC | PRN
  Administered 2022-12-18 – 2022-12-21 (×5): 650 mg via ORAL
  Filled 2022-12-18 (×5): qty 2

## 2022-12-18 MED ORDER — ACETAMINOPHEN 650 MG RE SUPP: 650.0000 mg | Freq: Four times a day (QID) | RECTAL | Status: DC | PRN

## 2022-12-18 MED ORDER — SODIUM CHLORIDE 0.9 % IV SOLN
2.0000 g | INTRAVENOUS | Status: DC
  Administered 2022-12-18 – 2022-12-22 (×5): 2 g via INTRAVENOUS
  Filled 2022-12-18 (×5): qty 20

## 2022-12-18 MED ORDER — LINEZOLID 600 MG/300ML IV SOLN
600.0000 mg | Freq: Two times a day (BID) | INTRAVENOUS | Status: DC
  Administered 2022-12-18 – 2022-12-23 (×11): 600 mg via INTRAVENOUS
  Filled 2022-12-18 (×12): qty 300
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
## 2022-12-18 NOTE — Progress Notes (Signed)
PT Cancellation Note  Patient Details Name: [name] MRN: [id number] DOB: 23-Mar-1956   Cancelled Treatment:    Reason Eval/Treat Not Completed: Other (comment)  OT reported pt waiting for knee aspiration. Will plan to follow-up this afternoon for formal PT evaluation.  [name] 12/18/2022, 11:10 AM
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
## 2022-12-18 NOTE — Progress Notes (Signed)
Ok to optimize zosyn/clinda to ceftriaxone/linezolid to empirically cover for possible nec fasciitis/septic knee per Dr. Jinny Sanders.   Ceftriaxone 2g IV q24 Linezolid 614m IV q12  MOnnie Boer PharmD, [facility] AAHIVP, CPP Infectious Disease Pharmacist 12/18/2022 12:11 PM

## 2022-12-18 NOTE — Procedures (Signed)
Procedure: Right knee aspiration    Indication: Right knee effusion(s)   Provider: Noemi Chapel, PA-C   Assist: None   Anesthesia: None   EBL: None   Complications: None   Findings: After risks/benefits explained patient desires to undergo procedure. Consent obtained and time out performed. The right knee was sterilely prepped and aspirated. Pt tolerated the procedure well.    PRE-PROCEDURE DIAGNOSIS:  Right knee effusion(s) POST-OPERATIVE DIAGNOSIS:  Same PROCEDURE:  Aspiration   PROCEDURE DETAILS:  After informed verbal consent was obtained the superolateral portal was prepped with chlorhexidine. Approximately 3 mL of lidocaine were injected superficially and then into the deeper tissues and into the joint space. Flash identified appropriate positioning and pressurized joint space. Medicine was left to sit for several minutes to take effect. Next, a 10 mL syringe with an 18-gauge needle was used to aspirate approximately 33m of serosanguinous fluid. She tolerated this well without complication and a bulky dressing was placed. Aspirate was sent to the lab for cell count and culture. We will follow the results of these closely.    CNoemi Chapel PA-C 12/18/2022 1:13 PM

## 2022-12-18 NOTE — Progress Notes (Signed)
FMTS Brief Progress Note  S: Patient states she is getting sleepy but comfortable.  No concerns or questions at this time.   O: BP 131/74 (BP Location: Left Arm)   Pulse 89   Temp 98.5 F (36.9 C) (Oral)   Resp 16   Ht 5' 4"$  (1.626 m)   Wt 81.6 kg   SpO2 100%   BMI 30.90 kg/m   Patient resting comfortably in bed, no acute distress, breathing comfortably on room air  A/P: Right knee wound Orthopedics consulted and right knee aspiration completed earlier today and sample sent for culture.  They will continue to follow, if cultures show signs of septic joint she will need a washout. -Continue IV antibiotics and plan per day team  Precious Gilding, DO 12/18/2022, 11:11 PM PGY-2, [facility] Night Resident  Please page [phone] with questions.

## 2022-12-18 NOTE — Assessment & Plan Note (Addendum)
Now s/p debridement and placement of wound vac with plastics. Off of antibiotics. Afebrile and doing well.  - Orthopedic surgery following, appreciate recs - Plastic surgery following, appreciate recs  - Scheduled tylenol 650 tylenol q 6 Hr prn - Oxycodone 4 mg q 4 hours prn  - plastics to replace wound vac with one insurance will cover then patient can be discharged.  - PT and Ot treat and eval  - Wound care consult

## 2022-12-18 NOTE — Progress Notes (Signed)
OT Cancellation Note  Patient Details Name: [name] MRN: [id number] DOB: Oct 09, 1956   Cancelled Treatment:    Reason Eval/Treat Not Completed: Other (comment) (RN reporting planned for wound aspiration and asking to see after.)  Elder Cyphers, OTR/L [facility] Acute Rehabilitation Office: [phone]   Magnus Ivan 12/18/2022, 10:27 AM
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
## 2022-12-18 NOTE — Progress Notes (Signed)
VASCULAR LAB    Right lower extremity venous duplex has been performed.  See CV proc for preliminary results.   Dionisia Pacholski, RVT 12/18/2022, 1:48 PM

## 2022-12-19 LAB — BASIC METABOLIC PANEL
Anion gap: 6 (ref 5–15)
BUN: 7 mg/dL — ABNORMAL LOW (ref 8–23)
CO2: 22 mmol/L (ref 22–32)
Calcium: 8.2 mg/dL — ABNORMAL LOW (ref 8.9–10.3)
Chloride: 109 mmol/L (ref 98–111)
Creatinine, Ser: 0.82 mg/dL (ref 0.44–1.00)
GFR, Estimated: >60 mL/min (ref >60)
Glucose, Bld: 123 mg/dL — ABNORMAL HIGH (ref 70–99)
Potassium: 3.2 mmol/L — ABNORMAL LOW (ref 3.5–5.1)
Sodium: 137 mmol/L (ref 135–145)

## 2022-12-19 LAB — SYNOVIAL CELL COUNT + DIFF, W/ CRYSTALS
Crystals, Fluid: NONE SEEN
Eosinophils-Synovial: 1 % (ref 0–1)
Lymphocytes-Synovial Fld: 7 % (ref 0–20)
Monocyte-Macrophage-Synovial Fluid: 3 % — ABNORMAL LOW (ref 50–90)
Neutrophil, Synovial: 89 % — ABNORMAL HIGH (ref 0–25)
WBC, Synovial: 43 /mm3 (ref 0–200)

## 2022-12-19 LAB — CBC
HCT: 27.3 % — ABNORMAL LOW (ref 36.0–46.0)
Hemoglobin: 8.6 g/dL — ABNORMAL LOW (ref 12.0–15.0)
MCH: 31.3 pg (ref 26.0–34.0)
MCHC: 31.5 g/dL (ref 30.0–36.0)
MCV: 99.3 fL (ref 80.0–100.0)
Platelets: 188 10*3/uL (ref 150–400)
RBC: 2.75 MIL/uL — ABNORMAL LOW (ref 3.87–5.11)
RDW: 14.2 % (ref 11.5–15.5)
WBC: 5.6 10*3/uL (ref 4.0–10.5)
nRBC: 0 % (ref 0.0–0.2)

## 2022-12-19 MED ORDER — GABAPENTIN 100 MG PO CAPS
100.0000 mg | ORAL_CAPSULE | Freq: Three times a day (TID) | ORAL | Status: DC
  Administered 2022-12-19 – 2022-12-24 (×14): 100 mg via ORAL
  Filled 2022-12-19 (×15): qty 1

## 2022-12-19 MED ORDER — POTASSIUM CHLORIDE 20 MEQ PO PACK
40.0000 meq | PACK | Freq: Once | ORAL | Status: AC
  Administered 2022-12-19: 40 meq via ORAL
  Filled 2022-12-19: qty 2
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
## 2022-12-19 NOTE — Progress Notes (Signed)
     Daily Progress Note Intern Pager: [phone]  [name] Medical record number: [id number] Date of birth: 02/23/1956 Age: 67 y.o. Gender: female  Primary Care Provider: McDiarmid, Blane Ohara, MD Consultants: Orthopedic Surgery Code Status: Full  Pt Overview and Major Events to Date:  12/18/22 - admitted  Assessment and Plan:  [name] is a 67 y.o. female presenting with knee pain . PMH includes HTN, HLD, DM2, Asthma, GERD, Insomnia, Hx CVA, Hx of bilat knee replacements   * Wound infection Knee wound concerning for worsening cellulitis, possible abscess vs septic joint. Patient had knee replacement 20 years prior, and is at risk for infection. Patient had joint aspiration yesterday, awaiting results. Abx switched to CFTX and linezolid yesterday. DVT US negative, PT/Ot recommending HHPT/OT. - Orthopedic surgery following, appreciate recs - F/u cell count and cultures - Weight Bearing As Tolerated - Scheduled tylenol 650 tylenol q 6 Hr prn - Oxycodone 4 mg q 4 hours prn  - Continue linezolid, CFTX  - PT and Ot treat and eval  - Wound care consult     Chronic Conditions HLD - continue atorvastatin  HTN - continue irbesartan and HCTZ  DM2 - A1c 5.6 03/25/22, not on home meds   FEN/GI: Regular Diet PPx: Lovenox Dispo:Home with home health pending clinical improvement .  Subjective:  Doing well today, pain well controlled.  Objective: Temp:  [98.5 F (36.9 C)-98.8 F (37.1 C)] 98.7 F (37.1 C) (02/18 0509) Pulse Rate:  [85-90] 85 (02/18 0509) Resp:  [16] 16 (02/18 0509) BP: (131-137)/(74-80) 137/80 (02/18 0509) SpO2:  [100 %] 100 % (02/18 0509) Physical Exam: General: Well appearing, NAD, conversant Cardiovascular: RRR, NRMG Respiratory: CTABL, no wheezes or rhonci Abdomen: Soft, NTTP, non-distended Extremities: 1+ pitting edema in RLE, bandage overlying knee.   Laboratory: Most recent CBC Lab Results  Component Value Date   WBC 5.6  12/19/2022   HGB 8.6 (L) 12/19/2022   HCT 27.3 (L) 12/19/2022   MCV 99.3 12/19/2022   PLT 188 12/19/2022   Most recent BMP    Latest Ref Rng & Units 12/19/2022   12:36 AM  BMP  Glucose 70 - 99 mg/dL 123   BUN 8 - 23 mg/dL 7   Creatinine 0.44 - 1.00 mg/dL 0.82   Sodium 135 - 145 mmol/L 137   Potassium 3.5 - 5.1 mmol/L 3.2   Chloride 98 - 111 mmol/L 109   CO2 22 - 32 mmol/L 22   Calcium 8.9 - 10.3 mg/dL 8.2     Hannah Bouche, MD 12/19/2022, 11:53 AM  PGY-2, [place] Intern pager: [phone], text pages welcome Secure chat group [place]

## 2022-12-19 NOTE — Plan of Care (Signed)
Dressing changed this shift. Patient requiring Oxycodone and tylenol for pain.  Problem: Education: Goal: Knowledge of General Education information will improve Description: Including pain rating scale, medication(s)/side effects and non-pharmacologic comfort measures Outcome: Progressing   Problem: Activity: Goal: Risk for activity intolerance will decrease Outcome: Progressing
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
## 2022-12-20 ENCOUNTER — Encounter (HOSPITAL_COMMUNITY): Payer: Self-pay | Admitting: Student

## 2022-12-20 DIAGNOSIS — S81001A Unspecified open wound, right knee, initial encounter: Secondary | ICD-10-CM | POA: Diagnosis not present

## 2022-12-20 DIAGNOSIS — L02415 Cutaneous abscess of right lower limb: Secondary | ICD-10-CM

## 2022-12-20 DIAGNOSIS — T148XXA Other injury of unspecified body region, initial encounter: Secondary | ICD-10-CM | POA: Diagnosis not present

## 2022-12-20 DIAGNOSIS — Z96651 Presence of right artificial knee joint: Secondary | ICD-10-CM | POA: Diagnosis not present

## 2022-12-20 DIAGNOSIS — S81809A Unspecified open wound, unspecified lower leg, initial encounter: Secondary | ICD-10-CM | POA: Diagnosis not present

## 2022-12-20 DIAGNOSIS — E876 Hypokalemia: Secondary | ICD-10-CM | POA: Diagnosis not present

## 2022-12-20 DIAGNOSIS — L089 Local infection of the skin and subcutaneous tissue, unspecified: Secondary | ICD-10-CM | POA: Diagnosis not present

## 2022-12-20 LAB — CBC
HCT: 27.3 % — ABNORMAL LOW (ref 36.0–46.0)
Hemoglobin: 8.5 g/dL — ABNORMAL LOW (ref 12.0–15.0)
MCH: 31 pg (ref 26.0–34.0)
MCHC: 31.1 g/dL (ref 30.0–36.0)
MCV: 99.6 fL (ref 80.0–100.0)
Platelets: 203 10*3/uL (ref 150–400)
RBC: 2.74 MIL/uL — ABNORMAL LOW (ref 3.87–5.11)
RDW: 14 % (ref 11.5–15.5)
WBC: 4.7 10*3/uL (ref 4.0–10.5)
nRBC: 0 % (ref 0.0–0.2)

## 2022-12-20 LAB — BASIC METABOLIC PANEL
Anion gap: 8 (ref 5–15)
BUN: 5 mg/dL — ABNORMAL LOW (ref 8–23)
CO2: 22 mmol/L (ref 22–32)
Calcium: 8.2 mg/dL — ABNORMAL LOW (ref 8.9–10.3)
Chloride: 109 mmol/L (ref 98–111)
Creatinine, Ser: 0.73 mg/dL (ref 0.44–1.00)
GFR, Estimated: >60 mL/min (ref >60)
Glucose, Bld: 131 mg/dL — ABNORMAL HIGH (ref 70–99)
Potassium: 3.2 mmol/L — ABNORMAL LOW (ref 3.5–5.1)
Sodium: 139 mmol/L (ref 135–145)

## 2022-12-20 LAB — MAGNESIUM: Magnesium: 1.9 mg/dL (ref 1.7–2.4)

## 2022-12-20 MED ORDER — POTASSIUM CHLORIDE CRYS ER 20 MEQ PO TBCR
40.0000 meq | EXTENDED_RELEASE_TABLET | Freq: Once | ORAL | Status: AC
  Administered 2022-12-20: 40 meq via ORAL
  Filled 2022-12-20: qty 2

## 2022-12-20 MED ORDER — ENOXAPARIN SODIUM 40 MG/0.4ML IJ SOSY
40.0000 mg | PREFILLED_SYRINGE | INTRAMUSCULAR | Status: AC
  Administered 2022-12-20: 40 mg via SUBCUTANEOUS
  Filled 2022-12-20: qty 0.4

## 2022-12-20 NOTE — Progress Notes (Addendum)
     Daily Progress Note Intern Pager: [phone]  [name] Medical record number: [id number] Date of birth: 04-May-1956 Age: 67 y.o. Gender: female  Primary Care Provider: McDiarmid, Blane Ohara, MD Consultants: Orthopedic Surgery Code Status: Full   Pt Overview and Major Events to Date:  12/18/22 - admitted, started clindamycin, vanc, and zosyn  2/17 - Switched antibiotics to linezolid and ceftriaxone    Assessment and Plan: [name] is a 67 y.o. female presenting with knee pain. Possibly has a septic joint.    PMH includes HTN, HLD, DM2, Asthma, GERD, Insomnia, Hx CVA, Hx of bilat knee replacements.   * Wound infection Patient fevered overnight despite antibiotics, increases risk of abscess needing source control. Knee wound concerning for worsening cellulitis, possible abscess vs septic joint. Aspirate culture no growth at this time. Patient had knee replacement 20 years prior, and is at risk for infection.  - Orthopedic surgery following, appreciate recs - F/u with Ortho regarding fluid collection.  - F/u cultures - Weight Bearing As Tolerated - Scheduled tylenol 650 tylenol q 6 Hr prn - Oxycodone 4 mg q 4 hours prn  - Continue linezolid, CFTX  - PT and Ot treat and eval  - Wound care consult   Hypokalemia K 3.2 Mag 1.9 this am. Repleted. Most likely low in the setting of diuretic use and history of gastric bypass.  - AM BMP  - Continue to replete as appropriate     Chronic Conditions HLD - continue atorvastatin  HTN - continue irbesartan and HCTZ  DM2 - A1c 5.6 03/25/22, not on home meds     FEN/GI: Regular Diet PPx: Lovenox Dispo:Home with home health pending clinical improvement .  Subjective:  Patient denies current pain in knee, but says it often is very painful and continues to have drainage. She says she wishes it could just be drained.   Objective: Temp:  [98.3 F (36.8 C)-100.7 F (38.2 C)] 98.3 F (36.8 C) (02/19 0350) Pulse Rate:   [81-99] 81 (02/19 0350) Resp:  [17-18] 18 (02/19 0350) BP: (129-138)/(75-78) 138/75 (02/19 0350) SpO2:  [99 %-100 %] 100 % (02/19 0350) Physical Exam: General: Well appearing, in no acute distress  Cardiovascular: RRR, no m/r/g, cap refill < 2 seconds Respiratory: No increased work of breathing on room air  Abdomen: Soft, non tender, non distended Extremities: R leg with dry wrap around knee, non pitting edema proximal to knee, no erythema   Laboratory: Most recent CBC Lab Results  Component Value Date   WBC 4.7 12/20/2022   HGB 8.5 (L) 12/20/2022   HCT 27.3 (L) 12/20/2022   MCV 99.6 12/20/2022   PLT 203 12/20/2022   Most recent BMP    Latest Ref Rng & Units 12/20/2022   12:55 AM  BMP  Glucose 70 - 99 mg/dL 131   BUN 8 - 23 mg/dL 5   Creatinine 0.44 - 1.00 mg/dL 0.73   Sodium 135 - 145 mmol/L 139   Potassium 3.5 - 5.1 mmol/L 3.2   Chloride 98 - 111 mmol/L 109   CO2 22 - 32 mmol/L 22   Calcium 8.9 - 10.3 mg/dL 8.2    Blood cultures NG2D  Body fluid rare WBC, no growth in 24 hours   Lowry Ram, MD 12/20/2022, 8:52 AM  PGY-1, [place] Intern pager: [phone], text pages welcome Secure chat group [place]
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
## 2022-12-20 NOTE — Progress Notes (Signed)
Mobility Specialist - Progress Note   12/20/22 1333  Mobility  Activity Ambulated with assistance in hallway  Level of Assistance Minimal assist, patient does 75% or more  Assistive Device Front wheel walker  Distance Ambulated (ft) 50 ft  Activity Response Tolerated well  Mobility Referral Yes  $Mobility charge 1 Mobility   Pt was received in chair and agreeable to mobility. Pt with slight RLE pain throughout session. Pt was returned to bed with all needs met.   Franki Monte  Mobility Specialist Please contact via Solicitor or Rehab office at [phone]
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
## 2022-12-20 NOTE — Assessment & Plan Note (Signed)
K 3.2 Repleted. Most likely low in the setting of diuretic use and history of gastric bypass.  - AM BMP  - Scheduled 40 meq K daily.

## 2022-12-21 DIAGNOSIS — L02415 Cutaneous abscess of right lower limb: Secondary | ICD-10-CM | POA: Diagnosis not present

## 2022-12-21 DIAGNOSIS — S81809A Unspecified open wound, unspecified lower leg, initial encounter: Secondary | ICD-10-CM | POA: Diagnosis not present

## 2022-12-21 DIAGNOSIS — L089 Local infection of the skin and subcutaneous tissue, unspecified: Secondary | ICD-10-CM | POA: Diagnosis not present

## 2022-12-21 DIAGNOSIS — T148XXA Other injury of unspecified body region, initial encounter: Secondary | ICD-10-CM | POA: Diagnosis not present

## 2022-12-21 LAB — CBC
HCT: 26.9 % — ABNORMAL LOW (ref 36.0–46.0)
Hemoglobin: 9 g/dL — ABNORMAL LOW (ref 12.0–15.0)
MCH: 31.8 pg (ref 26.0–34.0)
MCHC: 33.5 g/dL (ref 30.0–36.0)
MCV: 95.1 fL (ref 80.0–100.0)
Platelets: 208 10*3/uL (ref 150–400)
RBC: 2.83 MIL/uL — ABNORMAL LOW (ref 3.87–5.11)
RDW: 13.8 % (ref 11.5–15.5)
WBC: 5.1 10*3/uL (ref 4.0–10.5)
nRBC: 0 % (ref 0.0–0.2)

## 2022-12-21 LAB — BASIC METABOLIC PANEL
Anion gap: 6 (ref 5–15)
BUN: 5 mg/dL — ABNORMAL LOW (ref 8–23)
CO2: 24 mmol/L (ref 22–32)
Calcium: 8.4 mg/dL — ABNORMAL LOW (ref 8.9–10.3)
Chloride: 108 mmol/L (ref 98–111)
Creatinine, Ser: 0.81 mg/dL (ref 0.44–1.00)
GFR, Estimated: >60 mL/min (ref >60)
Glucose, Bld: 101 mg/dL — ABNORMAL HIGH (ref 70–99)
Potassium: 3.2 mmol/L — ABNORMAL LOW (ref 3.5–5.1)
Sodium: 138 mmol/L (ref 135–145)

## 2022-12-21 MED ORDER — POTASSIUM CHLORIDE CRYS ER 20 MEQ PO TBCR: 40.0000 meq | EXTENDED_RELEASE_TABLET | Freq: Once | ORAL | Status: DC

## 2022-12-21 MED ORDER — POTASSIUM CHLORIDE CRYS ER 20 MEQ PO TBCR
40.0000 meq | EXTENDED_RELEASE_TABLET | Freq: Every day | ORAL | Status: DC
  Administered 2022-12-22: 40 meq via ORAL
  Filled 2022-12-21: qty 2

## 2022-12-21 MED ORDER — POTASSIUM CHLORIDE CRYS ER 20 MEQ PO TBCR
40.0000 meq | EXTENDED_RELEASE_TABLET | Freq: Once | ORAL | Status: AC
  Administered 2022-12-21: 40 meq via ORAL
  Filled 2022-12-21: qty 2

## 2022-12-21 MED ORDER — CHLORHEXIDINE GLUCONATE CLOTH 2 % EX PADS
6.0000 | MEDICATED_PAD | Freq: Once | CUTANEOUS | Status: AC
  Administered 2022-12-22: 6 via TOPICAL
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
## 2022-12-21 NOTE — TOC Progression Note (Signed)
Transition of Care [facility]) - Progression Note    Patient Details  Name: [name] MRN: [id number] Date of Birth: 10/02/1956  Transition of Care [facility]) CM/SW [place], [place] Phone Number: 12/21/2022, 2:57 PM  Clinical Narrative:      CSW notified by attending team that pt requesting info about medicaid. CSW met bedside with pt. She is interested in [place] to get in-home services. CSW provides [employer] as well as Optometrist for pt to pursue medicaid.    Expected Discharge Plan: OP Rehab Barriers to Discharge: Continued Medical Work up  Expected Discharge Plan and Services   Discharge Planning Services: CM Consult   Living arrangements for the past 2 months: Apartment                 DME Arranged: Walker rolling DME Agency: [employer] Date DME Agency Contacted: 12/21/22 Time DME Agency Contacted: [id number] Representative spoke with at DME Agency: Erasmo Downer             Social Determinants of Health ([place]) Interventions SDOH Screenings   Food Insecurity: No Food Insecurity (07/10/2021)  Housing: Low Risk  (07/10/2021)  Transportation Needs: No Transportation Needs (07/10/2021)  Alcohol Screen: Low Risk  (07/10/2021)  Depression (PHQ2-9): Low Risk  (12/02/2022)  Financial Resource Strain: Low Risk  (07/10/2021)  Physical Activity: Sufficiently Active (07/10/2021)  Social Connections: Moderately Integrated (07/10/2021)  Stress: No Stress Concern Present (07/10/2021)  Tobacco Use: Medium Risk (12/20/2022)    Readmission Risk Interventions     No data to display

## 2022-12-21 NOTE — Progress Notes (Signed)
    Subjective: Patient reports pain as moderate. Medicines helping. Tolerating diet. Urinating.  No CP, SOB.  Has mobilized some OOB with PT. Scheduled for surgery tomorrow with Dr. Marla Roe.  Objective:   VITALS:   Vitals:   12/20/22 1600 12/20/22 1920 12/21/22 0233 12/21/22 0755  BP: (!) 148/83 139/73 (!) 146/88 (!) 150/91  Pulse: 78 82 84 79  Resp: 18 18 16 16  $ Temp: 98 F (36.7 C) 98.6 F (37 C) 98.8 F (37.1 C) 98.4 F (36.9 C)  TempSrc: Oral Oral Oral Oral  SpO2: 100% 100% 100% 100%  Weight:      Height:          Latest Ref Rng & Units 12/21/2022    3:10 AM 12/20/2022   12:55 AM 12/19/2022   12:36 AM  CBC  WBC 4.0 - 10.5 K/uL 5.1  4.7  5.6   Hemoglobin 12.0 - 15.0 g/dL 9.0  8.5  8.6   Hematocrit 36.0 - 46.0 % 26.9  27.3  27.3   Platelets 150 - 400 K/uL 208  203  188       Latest Ref Rng & Units 12/21/2022    3:10 AM 12/20/2022   12:55 AM 12/19/2022   12:36 AM  BMP  Glucose 70 - 99 mg/dL 101  131  123   BUN 8 - 23 mg/dL 5  5  7   $ Creatinine 0.44 - 1.00 mg/dL 0.81  0.73  0.82   Sodium 135 - 145 mmol/L 138  139  137   Potassium 3.5 - 5.1 mmol/L 3.2  3.2  3.2   Chloride 98 - 111 mmol/L 108  109  109   CO2 22 - 32 mmol/L 24  22  22   $ Calcium 8.9 - 10.3 mg/dL 8.4  8.2  8.2    Intake/Output      02/19 0701 02/20 0700 02/20 0701 02/21 0700   I.V. (mL/kg)     IV Piggyback     Total Intake(mL/kg)     Net          Urine Occurrence  1 x   Stool Occurrence  1 x      Physical Exam: General: NAD.  Sitting up in bed, calm, comfortable Resp: No increased wob Cardio: regular rate and rhythm ABD soft Neurologically intact MSK Neurovascularly intact Sensation intact distally Intact pulses distally Dorsiflexion/Plantar flexion intact Knee flexion limited d/t pain Compartments soft Wound to anterior right knee    Assessment:  Principal Problem:   Wound infection Active Problems:   Degloving injury of lower extremity   Hypertension   H/O total knee  replacement, bilateral   Knee pain   Hypokalemia  Plan: The patient was under the impression that an infection goes from her skin into the knee joint and that the arthroplasty implants might have to be removed during surgery. I explained in detail that nothing shows evidence of anything tracking into the joint so her implant pieces should be fine. She was very relieved to hear this. I told her that if the plastics team found anything different during surgery they would let us know.   Will turn over care to the plastics team. Contact us if needed.   F/u with ortho in office PRN.     Britt Bottom, PA-C Office [phone] 12/21/2022, 10:35 AM

## 2022-12-21 NOTE — TOC Transition Note (Signed)
Transition of Care [facility]) - CM/SW Discharge Note   Patient Details  Name: [name] MRN: [id number] Date of Birth: [birth year]  Transition of [facility]) CM/SW Contact:  Cyndi Bender, RN Phone Number: 12/21/2022, 1:18 PM   Clinical Narrative:     Order for walker.  Patient agreeable to use in house provider adapt for DME needs.  Notified Erasmo Downer with adapt of walker order.  Patient is active with [facility]. This RNCM notified patient and MD that no Home health agency will accept the referral due to it being filed under liability insurance.  TOC will continue to follow for needs.     Barriers to Discharge: Continued Medical Work up   Patient Goals and CMS Choice      Discharge Placement                         Discharge Plan and Services Additional resources added to the After Visit Summary for     Discharge Planning Services: CM Consult            DME Arranged: Gilford Rile rolling DME Agency: [employer] Date DME Agency Contacted: 12/21/22 Time DME Agency Contacted: [id number] Representative spoke with at DME Agency: [place] Interventions SDOH Screenings   Food Insecurity: No Food Insecurity (07/10/2021)  Housing: Low Risk  (07/10/2021)  Transportation Needs: No Transportation Needs (07/10/2021)  Alcohol Screen: Low Risk  (07/10/2021)  Depression (PHQ2-9): Low Risk  (12/02/2022)  Financial Resource Strain: Low Risk  (07/10/2021)  Physical Activity: Sufficiently Active (07/10/2021)  Social Connections: Moderately Integrated (07/10/2021)  Stress: No Stress Concern Present (07/10/2021)  Tobacco Use: Medium Risk (12/20/2022)     Readmission Risk Interventions     No data to display
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
## 2022-12-21 NOTE — Progress Notes (Addendum)
Daily Progress Note Intern Pager: [phone]  [name] Medical record number: [id number] Date of birth: December 03, 1955 Age: 67 y.o. Gender: female  Primary Care Provider: McDiarmid, Blane Ohara, MD Consultants: Orthopedic Surgery, Plastic Surgery  Code Status: Full   Pt Overview and Major Events to Date:  12/18/22 - admitted, started clindamycin, vanc, and zosyn  2/17 - Switched antibiotics to linezolid and ceftriaxone   Assessment and Plan: [name] is a 67 y.o. female presenting with knee pain. Possibly has a septic joint.     PMH includes HTN, HLD, DM2, Asthma, GERD, Insomnia, Hx CVA, Hx of bilat knee replacements.   * Wound infection Afebrile with stable vital signs. Aspirate culture no growth for 2 days. However, there is concern for an abscess in the fluid collection beneath her degloving injury. Patient had knee replacement 20 years prior, and is at risk for complicated infection. Plan for debridement of wound with placement of wound vac with plastics on 2/21.  - Orthopedic surgery following, appreciate recs - Plastic surgery following, appreciate recs  - NPO at midnight  - Weight Bearing As Tolerated - Scheduled tylenol 650 tylenol q 6 Hr prn - Oxycodone 4 mg q 4 hours prn  - Continue linezolid, CFTX at least until source control is reached with surgery tomorrow  - PT and Ot treat and eval  - Wound care consult  - Will not consult ID at this time, will consider consulting if patient shows signs of worsening infection.   Hypokalemia K 3.2 Mag 1.9 this am. Repleted. Most likely low in the setting of diuretic use and history of gastric bypass.  - AM BMP  - Continue to replete as appropriate    FEN/GI: Regular, NPO tonight for plastics surgery tomorrow  PPx: dvt ppx held for surgery tomorrow  Dispo:Home with home health pending clinical improvement.   Subjective:  Patient says her pain is well controlled. She is looking forward to her surgery with  plastics to have a definitive treatment of her injury. She denies chills overnight. Patient mentioned she would like to discuss how to apply for Medicaid with someone.   Objective: Temp:  [98 F (36.7 C)-98.8 F (37.1 C)] 98.4 F (36.9 C) (02/20 0755) Pulse Rate:  [78-84] 79 (02/20 0755) Resp:  [16-18] 16 (02/20 0755) BP: (139-150)/(73-91) 150/91 (02/20 0755) SpO2:  [100 %] 100 % (02/20 0755) Physical Exam: General: Well appearing, sitting up in bed, in no acute distress  Cardiovascular: RRR, no m/r/g, cap refill < 2 seconds Respiratory: No increased work of breathing on room air  Abdomen: Soft, non tender, non distended Extremities: R leg with dry wrap around knee, non pitting edema proximal to knee, no erythema   Laboratory: Most recent CBC Lab Results  Component Value Date   WBC 5.1 12/21/2022   HGB 9.0 (L) 12/21/2022   HCT 26.9 (L) 12/21/2022   MCV 95.1 12/21/2022   PLT 208 12/21/2022   Most recent BMP    Latest Ref Rng & Units 12/21/2022    3:10 AM  BMP  Glucose 70 - 99 mg/dL 101   BUN 8 - 23 mg/dL 5   Creatinine 0.44 - 1.00 mg/dL 0.81   Sodium 135 - 145 mmol/L 138   Potassium 3.5 - 5.1 mmol/L 3.2   Chloride 98 - 111 mmol/L 108   CO2 22 - 32 mmol/L 24   Calcium 8.9 - 10.3 mg/dL 8.4    Lowry Ram, MD 12/21/2022, 11:40  AM  PGY-1, [place] Intern pager: [phone], text pages welcome Secure chat group [place]
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
## 2022-12-21 NOTE — Progress Notes (Signed)
Mobility Specialist - Progress Note   12/21/22 1425  Mobility  Activity Ambulated with assistance in hallway  Level of Assistance Contact guard assist, steadying assist  Assistive Device Front wheel walker  Distance Ambulated (ft) 100 ft  Activity Response Tolerated well  Mobility Referral Yes  $Mobility charge 1 Mobility   Pt was received in bed and agreeable to mobility. No complaints throughout session. Pt was returned to bed with all needs met.  Franki Monte  Mobility Specialist Please contact via Solicitor or Rehab office at [phone]

## 2022-12-22 ENCOUNTER — Inpatient Hospital Stay (HOSPITAL_COMMUNITY): Payer: Medicare HMO | Admitting: Anesthesiology

## 2022-12-22 ENCOUNTER — Encounter (HOSPITAL_COMMUNITY): Payer: Self-pay | Admitting: Student

## 2022-12-22 ENCOUNTER — Other Ambulatory Visit: Payer: Self-pay

## 2022-12-22 ENCOUNTER — Encounter (HOSPITAL_COMMUNITY)
Admission: EM | Disposition: A | Discharge status: Home / Self Care | Payer: Self-pay | Source: Home / Self Care | Attending: Family Medicine

## 2022-12-22 DIAGNOSIS — I10 Essential (primary) hypertension: Secondary | ICD-10-CM | POA: Diagnosis not present

## 2022-12-22 DIAGNOSIS — T8149XA Infection following a procedure, other surgical site, initial encounter: Secondary | ICD-10-CM

## 2022-12-22 DIAGNOSIS — J45909 Unspecified asthma, uncomplicated: Secondary | ICD-10-CM

## 2022-12-22 DIAGNOSIS — D649 Anemia, unspecified: Secondary | ICD-10-CM

## 2022-12-22 DIAGNOSIS — Z87891 Personal history of nicotine dependence: Secondary | ICD-10-CM

## 2022-12-22 DIAGNOSIS — L089 Local infection of the skin and subcutaneous tissue, unspecified: Secondary | ICD-10-CM | POA: Diagnosis not present

## 2022-12-22 DIAGNOSIS — S81809A Unspecified open wound, unspecified lower leg, initial encounter: Secondary | ICD-10-CM | POA: Diagnosis not present

## 2022-12-22 DIAGNOSIS — L02415 Cutaneous abscess of right lower limb: Secondary | ICD-10-CM | POA: Diagnosis not present

## 2022-12-22 DIAGNOSIS — T148XXA Other injury of unspecified body region, initial encounter: Secondary | ICD-10-CM | POA: Diagnosis not present

## 2022-12-22 DIAGNOSIS — S81001A Unspecified open wound, right knee, initial encounter: Secondary | ICD-10-CM

## 2022-12-22 HISTORY — PX: HEMATOMA EVACUATION: SHX5118

## 2022-12-22 HISTORY — PX: APPLICATION OF WOUND VAC: SHX5189

## 2022-12-22 LAB — CBC
HCT: 29.1 % — ABNORMAL LOW (ref 36.0–46.0)
Hemoglobin: 9.4 g/dL — ABNORMAL LOW (ref 12.0–15.0)
MCH: 30.9 pg (ref 26.0–34.0)
MCHC: 32.3 g/dL (ref 30.0–36.0)
MCV: 95.7 fL (ref 80.0–100.0)
Platelets: 232 10*3/uL (ref 150–400)
RBC: 3.04 MIL/uL — ABNORMAL LOW (ref 3.87–5.11)
RDW: 13.8 % (ref 11.5–15.5)
WBC: 5.2 10*3/uL (ref 4.0–10.5)
nRBC: 0 % (ref 0.0–0.2)

## 2022-12-22 LAB — BASIC METABOLIC PANEL
Anion gap: 5 (ref 5–15)
BUN: 6 mg/dL — ABNORMAL LOW (ref 8–23)
CO2: 27 mmol/L (ref 22–32)
Calcium: 8.4 mg/dL — ABNORMAL LOW (ref 8.9–10.3)
Chloride: 105 mmol/L (ref 98–111)
Creatinine, Ser: 0.7 mg/dL (ref 0.44–1.00)
GFR, Estimated: >60 mL/min (ref >60)
Glucose, Bld: 102 mg/dL — ABNORMAL HIGH (ref 70–99)
Potassium: 3.4 mmol/L — ABNORMAL LOW (ref 3.5–5.1)
Sodium: 137 mmol/L (ref 135–145)

## 2022-12-22 LAB — BODY FLUID CULTURE W GRAM STAIN: Culture: NO GROWTH

## 2022-12-22 LAB — CULTURE, BLOOD (ROUTINE X 2): Culture: NO GROWTH

## 2022-12-22 LAB — GLUCOSE, CAPILLARY
Glucose-Capillary: 65 mg/dL — ABNORMAL LOW (ref 70–99)
Glucose-Capillary: 68 mg/dL — ABNORMAL LOW (ref 70–99)
Glucose-Capillary: 120 mg/dL — ABNORMAL HIGH (ref 70–99)

## 2022-12-22 LAB — SURGICAL PCR SCREEN
MRSA, PCR: NEGATIVE
Staphylococcus aureus: NEGATIVE

## 2022-12-22 SURGERY — EVACUATION HEMATOMA
Anesthesia: General | Site: Knee | Laterality: Right

## 2022-12-22 MED ORDER — ONDANSETRON HCL 4 MG/2ML IJ SOLN
INTRAMUSCULAR | Status: DC | PRN
  Administered 2022-12-22: 4 mg via INTRAVENOUS

## 2022-12-22 MED ORDER — LACTATED RINGERS IV SOLN: INTRAVENOUS | Status: DC

## 2022-12-22 MED ORDER — DEXTROSE 50 % IV SOLN
INTRAVENOUS | Status: AC
  Administered 2022-12-22: 25 mL via INTRAVENOUS
  Filled 2022-12-22: qty 50

## 2022-12-22 MED ORDER — LIDOCAINE 2% (20 MG/ML) 5 ML SYRINGE
INTRAMUSCULAR | Status: DC | PRN
  Administered 2022-12-22: 90 mg via INTRAVENOUS

## 2022-12-22 MED ORDER — LIDOCAINE-EPINEPHRINE 1 %-1:100000 IJ SOLN
INTRAMUSCULAR | Status: AC
  Filled 2022-12-22: qty 1

## 2022-12-22 MED ORDER — SODIUM CHLORIDE 0.9 % IV SOLN
INTRAVENOUS | Status: AC
  Filled 2022-12-22 (×2): qty 10

## 2022-12-22 MED ORDER — MIDAZOLAM HCL 2 MG/2ML IJ SOLN
INTRAMUSCULAR | Status: DC | PRN
  Administered 2022-12-22: 2 mg via INTRAVENOUS

## 2022-12-22 MED ORDER — MIDAZOLAM HCL 2 MG/2ML IJ SOLN
INTRAMUSCULAR | Status: AC
  Filled 2022-12-22: qty 2

## 2022-12-22 MED ORDER — PROPOFOL 10 MG/ML IV BOLUS
INTRAVENOUS | Status: DC | PRN
  Administered 2022-12-22: 200 mg via INTRAVENOUS

## 2022-12-22 MED ORDER — ORAL CARE MOUTH RINSE: 15.0000 mL | Freq: Once | OROMUCOSAL | Status: DC

## 2022-12-22 MED ORDER — SODIUM CHLORIDE 0.9% FLUSH: 3.0000 mL | Freq: Two times a day (BID) | INTRAVENOUS | Status: DC

## 2022-12-22 MED ORDER — CALCIUM CARBONATE ANTACID 500 MG PO CHEW
1.0000 | CHEWABLE_TABLET | Freq: Once | ORAL | Status: AC
  Administered 2022-12-22: 200 mg via ORAL
  Filled 2022-12-22: qty 1

## 2022-12-22 MED ORDER — PROPOFOL 10 MG/ML IV BOLUS
INTRAVENOUS | Status: AC
  Filled 2022-12-22: qty 20

## 2022-12-22 MED ORDER — FENTANYL CITRATE (PF) 250 MCG/5ML IJ SOLN
INTRAMUSCULAR | Status: DC | PRN
  Administered 2022-12-22 (×2): 25 ug via INTRAVENOUS
  Administered 2022-12-22: 75 ug via INTRAVENOUS
  Administered 2022-12-22: 25 ug via INTRAVENOUS

## 2022-12-22 MED ORDER — MUPIROCIN 2 % EX OINT
1.0000 | TOPICAL_OINTMENT | Freq: Two times a day (BID) | CUTANEOUS | Status: DC
  Administered 2022-12-22 – 2022-12-24 (×5): 1 via NASAL
  Filled 2022-12-22: qty 22

## 2022-12-22 MED ORDER — ACETAMINOPHEN 325 MG PO TABS: 650.0000 mg | ORAL_TABLET | ORAL | Status: DC | PRN

## 2022-12-22 MED ORDER — OXYCODONE HCL 5 MG PO TABS: 5.0000 mg | ORAL_TABLET | ORAL | Status: DC | PRN

## 2022-12-22 MED ORDER — SODIUM CHLORIDE 0.9 % IV SOLN: 250.0000 mL | INTRAVENOUS | Status: DC | PRN

## 2022-12-22 MED ORDER — DEXTROSE 50 % IV SOLN: 25.0000 mL | Freq: Once | INTRAVENOUS | Status: AC

## 2022-12-22 MED ORDER — FENTANYL CITRATE (PF) 100 MCG/2ML IJ SOLN: 25.0000 ug | INTRAMUSCULAR | Status: DC | PRN

## 2022-12-22 MED ORDER — CHLORHEXIDINE GLUCONATE 0.12 % MT SOLN
15.0000 mL | Freq: Once | OROMUCOSAL | Status: DC
  Filled 2022-12-22: qty 15

## 2022-12-22 MED ORDER — SODIUM CHLORIDE 0.9% FLUSH: 3.0000 mL | INTRAVENOUS | Status: DC | PRN

## 2022-12-22 MED ORDER — FENTANYL CITRATE (PF) 250 MCG/5ML IJ SOLN
INTRAMUSCULAR | Status: AC
  Filled 2022-12-22: qty 5

## 2022-12-22 MED ORDER — ACETAMINOPHEN 650 MG RE SUPP: 650.0000 mg | RECTAL | Status: DC | PRN

## 2022-12-22 MED ORDER — SODIUM CHLORIDE 0.9 % IR SOLN
Status: DC | PRN
  Administered 2022-12-22: 1000 mL

## 2022-12-22 MED ORDER — DEXAMETHASONE SODIUM PHOSPHATE 10 MG/ML IJ SOLN
INTRAMUSCULAR | Status: DC | PRN
  Administered 2022-12-22: 5 mg via INTRAVENOUS

## 2022-12-22 SURGICAL SUPPLY — 32 items
BAG COUNTER SPONGE SURGICOUNT (BAG) ×2 IMPLANT
BAG SPNG CNTER NS LX DISP (BAG) ×1
BNDG CMPR MED 10X6 ELC LF (GAUZE/BANDAGES/DRESSINGS) ×1
BNDG ELASTIC 6X10 VLCR STRL LF (GAUZE/BANDAGES/DRESSINGS) IMPLANT
BNDG GAUZE DERMACEA FLUFF 4 (GAUZE/BANDAGES/DRESSINGS) IMPLANT
BNDG GZE DERMACEA 4 6PLY (GAUZE/BANDAGES/DRESSINGS) ×1
CANISTER SUCT 3000ML PPV (MISCELLANEOUS) IMPLANT
COVER SURGICAL LIGHT HANDLE (MISCELLANEOUS) ×2 IMPLANT
DRAPE INCISE IOBAN 66X45 STRL (DRAPES) IMPLANT
DRAPE ORTHO SPLIT 77X108 STRL (DRAPES) ×2
DRAPE SURG ORHT 6 SPLT 77X108 (DRAPES) IMPLANT
DRSG CUTIMED SORBACT 7X9 (GAUZE/BANDAGES/DRESSINGS) IMPLANT
DRSG VAC GRANUFOAM MED (GAUZE/BANDAGES/DRESSINGS) IMPLANT
ELECT NDL TIP 2.8 STRL (NEEDLE) IMPLANT
ELECT NEEDLE TIP 2.8 STRL (NEEDLE) IMPLANT
ELECT REM PT RETURN 9FT ADLT (ELECTROSURGICAL) ×1
ELECTRODE REM PT RTRN 9FT ADLT (ELECTROSURGICAL) ×2 IMPLANT
GAUZE SPONGE 4X4 12PLY STRL (GAUZE/BANDAGES/DRESSINGS) IMPLANT
GLOVE BIO SURGEON STRL SZ 6.5 (GLOVE) ×4 IMPLANT
GOWN STRL REUS W/ TWL LRG LVL3 (GOWN DISPOSABLE) ×4 IMPLANT
GOWN STRL REUS W/TWL LRG LVL3 (GOWN DISPOSABLE) ×2
GRAFT MYRIAD 3 LAYER 7X10 (Graft) IMPLANT
KIT BASIN OR (CUSTOM PROCEDURE TRAY) ×2 IMPLANT
KIT TURNOVER KIT B (KITS) ×2 IMPLANT
NS IRRIG 1000ML POUR BTL (IV SOLUTION) ×2 IMPLANT
PACK GENERAL/GYN (CUSTOM PROCEDURE TRAY) ×2 IMPLANT
PAD ARMBOARD 7.5X6 YLW CONV (MISCELLANEOUS) ×4 IMPLANT
POWDER MORCELSS FINE 2000MG (Miscellaneous) IMPLANT
PWDR MORCELSS FINE 2000MG (Miscellaneous) ×1 IMPLANT
SUT VIC AB 4-0 PS2 18 (SUTURE) IMPLANT
TOWEL GREEN STERILE (TOWEL DISPOSABLE) ×2 IMPLANT
TOWEL GREEN STERILE FF (TOWEL DISPOSABLE) ×2 IMPLANT

## 2022-12-22 NOTE — Progress Notes (Signed)
     Daily Progress Note Intern Pager: [phone]  [name] Medical record number: [id number] Date of birth: 04-27-1956 Age: 67 y.o. Gender: female  Primary Care Provider: McDiarmid, Blane Ohara, MD Consultants: Orthopedic Surgery, Plastic Surgery  Code Status: Full    Pt Overview and Major Events to Date:  12/18/22 - admitted, started clindamycin, vanc, and zosyn  2/17 - Switched antibiotics to linezolid and ceftriaxone    Assessment and Plan: [name] is a 67 y.o. female presenting with knee pain s/t [place] injury with possible abscess.     PMH includes HTN, HLD, DM2, Asthma, GERD, Insomnia, Hx CVA, Hx of bilat knee replacements.   * Wound infection Afebrile with stable vital signs. However, there is concern for an abscess in the fluid collection beneath her degloving injury. Plan for debridement of wound with placement of wound vac with plastics today.  - Orthopedic surgery following, appreciate recs - Plastic surgery following, appreciate recs  - Scheduled tylenol 650 tylenol q 6 Hr prn - Oxycodone 4 mg q 4 hours prn  - Continue linezolid, CFTX at least until source control is reached with surgery today - PT and Ot treat and eval  - Wound care consult   Hypokalemia K 3.2 Repleted. Most likely low in the setting of diuretic use and history of gastric bypass.  - AM BMP  - Scheduled 40 meq K daily.     FEN/GI: NPO  PPx: Held for surgery  Dispo:Home with home health pending clinical improvement .   Subjective:  Patient denies any complaints. Says her pain is controlled. Had some acid reflux symptoms this morning from only taking her pills.   Objective: Temp:  [98.3 F (36.8 C)-99.4 F (37.4 C)] 99.4 F (37.4 C) (02/21 0805) Pulse Rate:  [75-87] 75 (02/21 0805) Resp:  [16-18] 16 (02/21 0805) BP: (132-151)/(80-89) 149/86 (02/21 0805) SpO2:  [100 %] 100 % (02/21 0805) Physical Exam: General: Well appearing, sitting up in bed, in no acute  distress  Cardiovascular: RRR, no m/r/g, cap refill < 2 seconds Respiratory: No increased work of breathing on room air  Abdomen: Soft, non tender, non distended Extremities: R leg with dry wrap around knee, non pitting edema proximal to knee, no erythema   Laboratory: Most recent CBC Lab Results  Component Value Date   WBC 5.2 12/22/2022   HGB 9.4 (L) 12/22/2022   HCT 29.1 (L) 12/22/2022   MCV 95.7 12/22/2022   PLT 232 12/22/2022   Most recent BMP    Latest Ref Rng & Units 12/22/2022    1:41 AM  BMP  Glucose 70 - 99 mg/dL 102   BUN 8 - 23 mg/dL 6   Creatinine 0.44 - 1.00 mg/dL 0.70   Sodium 135 - 145 mmol/L 137   Potassium 3.5 - 5.1 mmol/L 3.4   Chloride 98 - 111 mmol/L 105   CO2 22 - 32 mmol/L 27   Calcium 8.9 - 10.3 mg/dL 8.4     Lowry Ram, MD 12/22/2022, 1:06 PM  PGY-1, [place] Intern pager: [phone], text pages welcome Secure chat group [place]

## 2022-12-22 NOTE — Progress Notes (Signed)
Hypoglycemic Event  CBG: 68   Treatment: D50 25 mL (12.5 gm)  Symptoms: None  Follow-up CBG: Time:1750 CBG Result:120  Possible Reasons for Event: Inadequate meal intake  Comments/MD notified:Dr Doroteo Glassman See order    Signe Colt
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
## 2022-12-22 NOTE — Interval H&P Note (Signed)
History and Physical Interval Note:  12/22/2022 4:07 PM  [name]  has presented today for surgery, with the diagnosis of wound infection.  The various methods of treatment have been discussed with the patient and family. After consideration of risks, benefits and other options for treatment, the patient has consented to  Procedure(s): right knee hematoma evacuation and placement of Myriad and wound vac (Right) APPLICATION OF SKIN SUBSTITUTE (Right) APPLICATION OF WOUND VAC (Right) as a surgical intervention.  The patient's history has been reviewed, patient examined, no change in status, stable for surgery.  I have reviewed the patient's chart and labs.  Questions were answered to the patient's satisfaction.     Loel Lofty Deionte Spivack

## 2022-12-22 NOTE — Progress Notes (Signed)
FMTS Interim Progress Note  S: Went to evaluate patient at bedside with Dr. Madison Hickman. Patient was sleeping, and comfortable.  O: BP 126/81 (BP Location: Left Arm)   Pulse 80   Temp 98.1 F (36.7 C) (Oral)   Resp 18   Ht 5' 4"$  (1.626 m)   Wt 81.6 kg   SpO2 99%   BMI 30.90 kg/m    Gen: NAD, sleeping Resp: Breathing comfortably on RA  A/P: Wound Infection S/p excision of right knee wound and hematoma evacuation. Appears to be resting comfortably. Continue care plan as outlined by day team. Anticipate restarting DVT prophylaxis tomorrow.   Leslie Dales, DO 12/22/2022, 10:27 PM PGY-1, [place] Medicine Service pager [phone]

## 2022-12-22 NOTE — Transfer of Care (Signed)
Immediate Anesthesia Transfer of Care Note  Patient: [name]  Procedure(s) Performed: EXCISION OF right knee WOUND AND  hematoma evacuation (Right: Knee) APPLICATION OF SKIN SUBSTITUTE MYRIAD (Right: Knee) APPLICATION OF WOUND VAC (Right: Knee)  Patient Location: PACU  Anesthesia Type:General  Level of Consciousness: awake, alert , and oriented  Airway & Oxygen Therapy: Patient Spontanous Breathing  Post-op Assessment: Report given to RN and Post -op Vital signs reviewed and stable  Post vital signs: Reviewed and stable  Last Vitals:  Vitals Value Taken Time  BP 131/74 12/22/22 1652  Temp    Pulse 90 12/22/22 1657  Resp 17 12/22/22 1657  SpO2 97 % 12/22/22 1657  Vitals shown include unvalidated device data.  Last Pain:  Vitals:   12/22/22 1525  TempSrc: Oral  PainSc: 0-No pain      Patients Stated Pain Goal: 4 ([id number])  Complications: No notable events documented.

## 2022-12-22 NOTE — Progress Notes (Signed)
Mobility Specialist - Progress Note   12/22/22 1210  Mobility  Activity Ambulated with assistance in hallway  Level of Assistance Standby assist, set-up cues, supervision of patient - no hands on  Assistive Device Front wheel walker  Distance Ambulated (ft) 350 ft  Activity Response Tolerated well  Mobility Referral Yes  $Mobility charge 1 Mobility   Pt was received in bed and agreeable to mobility. Pt c/o slight RLE pain throughout session. Pt was returned to EOB with all needs met and RN notified.   Franki Monte  Mobility Specialist Please contact via Solicitor or Rehab office at [phone]

## 2022-12-22 NOTE — Progress Notes (Signed)
Hypoglycemic Event  CBG: 65   Treatment: 4 oz juice/soda  Symptoms: None  Follow-up CBG: Time:1720 CBG Result:68  Possible Reasons for Event: Inadequate meal intake  Comments/MD notified:Dr Cleone Slim, Richardo Hanks

## 2022-12-22 NOTE — Anesthesia Preprocedure Evaluation (Addendum)
Anesthesia Evaluation  Patient identified by MRN, date of birth, ID band Patient awake    Reviewed: Allergy & Precautions, NPO status , Patient's Chart, lab work & pertinent test results  Airway Mallampati: II  TM Distance: >3 FB Neck ROM: Full    Dental no notable dental hx. (+) Teeth Intact, Dental Advisory Given   Pulmonary asthma , former smoker   Pulmonary exam normal breath sounds clear to auscultation       Cardiovascular hypertension, Normal cardiovascular exam Rhythm:Regular Rate:Normal     Neuro/Psych CVA    GI/Hepatic   Endo/Other  diabetes    Renal/GU Lab Results      Component                Value               Date                      CREATININE               0.70                12/22/2022                BUN                      6 (L)               12/22/2022                NA                       137                 12/22/2022                K                        3.4 (L)             12/22/2022                     Musculoskeletal  (+) Arthritis ,    Abdominal   Peds  Hematology  (+) Blood dyscrasia, anemia Lab Results      Component                Value               Date                      WBC                      5.2                 12/22/2022                HGB                      9.4 (L)             12/22/2022                HCT                      29.1 (L)  12/22/2022                MCV                      95.7                12/22/2022                PLT                      232                 12/22/2022              Anesthesia Other Findings All: Asa Nsaids Protonix Tolmentin  Reproductive/Obstetrics                             Anesthesia Physical Anesthesia Plan  ASA: 3  Anesthesia Plan: General   Post-op Pain Management:    Induction: Intravenous  PONV Risk Score and Plan: Treatment may vary due to age or medical condition, Midazolam  and Ondansetron  Airway Management Planned: LMA and Oral ETT  Additional Equipment: None  Intra-op Plan:   Post-operative Plan: Extubation in OR  Informed Consent: I have reviewed the patients History and Physical, chart, labs and discussed the procedure including the risks, benefits and alternatives for the proposed anesthesia with the patient or authorized representative who has indicated his/her understanding and acceptance.     Dental advisory given  Plan Discussed with: CRNA, Anesthesiologist and Surgeon  Anesthesia Plan Comments: (Ozempic Q monday)        Anesthesia Quick Evaluation
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
## 2022-12-22 NOTE — Anesthesia Procedure Notes (Signed)
Procedure Name: LMA Insertion Date/Time: 12/22/2022 4:19 PM  Performed by: Dorthea Cove, CRNAPre-anesthesia Checklist: Patient identified, Emergency Drugs available, Suction available and Patient being monitored Patient Re-evaluated:Patient Re-evaluated prior to induction Oxygen Delivery Method: Circle System Utilized Preoxygenation: Pre-oxygenation with 100% oxygen Induction Type: IV induction Ventilation: Mask ventilation without difficulty LMA: LMA inserted LMA Size: 4.0 Number of attempts: 1 Airway Equipment and Method: Bite block Placement Confirmation: positive ETCO2 Tube secured with: Tape Dental Injury: Teeth and Oropharynx as per pre-operative assessment

## 2022-12-23 ENCOUNTER — Ambulatory Visit: Payer: Medicare HMO | Admitting: Family Medicine

## 2022-12-23 ENCOUNTER — Encounter (HOSPITAL_COMMUNITY): Payer: Self-pay | Admitting: Plastic Surgery

## 2022-12-23 ENCOUNTER — Other Ambulatory Visit (HOSPITAL_COMMUNITY): Payer: Self-pay

## 2022-12-23 DIAGNOSIS — L089 Local infection of the skin and subcutaneous tissue, unspecified: Secondary | ICD-10-CM | POA: Diagnosis not present

## 2022-12-23 DIAGNOSIS — S81809A Unspecified open wound, unspecified lower leg, initial encounter: Secondary | ICD-10-CM | POA: Diagnosis not present

## 2022-12-23 DIAGNOSIS — T148XXA Other injury of unspecified body region, initial encounter: Secondary | ICD-10-CM | POA: Diagnosis not present

## 2022-12-23 LAB — CBC
HCT: 31.3 % — ABNORMAL LOW (ref 36.0–46.0)
Hemoglobin: 10.3 g/dL — ABNORMAL LOW (ref 12.0–15.0)
MCH: 31.5 pg (ref 26.0–34.0)
MCHC: 32.9 g/dL (ref 30.0–36.0)
MCV: 95.7 fL (ref 80.0–100.0)
Platelets: 229 10*3/uL (ref 150–400)
RBC: 3.27 MIL/uL — ABNORMAL LOW (ref 3.87–5.11)
RDW: 13.7 % (ref 11.5–15.5)
WBC: 5.2 10*3/uL (ref 4.0–10.5)
nRBC: 0 % (ref 0.0–0.2)

## 2022-12-23 LAB — BASIC METABOLIC PANEL
Anion gap: 8 (ref 5–15)
BUN: 6 mg/dL — ABNORMAL LOW (ref 8–23)
CO2: 23 mmol/L (ref 22–32)
Calcium: 8.6 mg/dL — ABNORMAL LOW (ref 8.9–10.3)
Chloride: 108 mmol/L (ref 98–111)
Creatinine, Ser: 0.96 mg/dL (ref 0.44–1.00)
GFR, Estimated: >60 mL/min (ref >60)
Glucose, Bld: 107 mg/dL — ABNORMAL HIGH (ref 70–99)
Potassium: 4.3 mmol/L (ref 3.5–5.1)
Sodium: 139 mmol/L (ref 135–145)

## 2022-12-23 LAB — CULTURE, BLOOD (ROUTINE X 2)
Culture: NO GROWTH
Special Requests: ADEQUATE

## 2022-12-23 MED ORDER — GABAPENTIN 100 MG PO CAPS
100.0000 mg | ORAL_CAPSULE | Freq: Three times a day (TID) | ORAL | 0 refills | Status: DC | PRN
  Filled 2022-12-23: qty 30, 10d supply, fill #0

## 2022-12-23 MED ORDER — ENOXAPARIN SODIUM 40 MG/0.4ML IJ SOSY
40.0000 mg | PREFILLED_SYRINGE | Freq: Every day | INTRAMUSCULAR | Status: DC
  Administered 2022-12-23 – 2022-12-24 (×2): 40 mg via SUBCUTANEOUS
  Filled 2022-12-23 (×2): qty 0.4

## 2022-12-23 MED ORDER — SUCRALFATE 1 GM/10ML PO SUSP
1.0000 g | Freq: Two times a day (BID) | ORAL | Status: DC
  Administered 2022-12-23 – 2022-12-24 (×3): 1 g via ORAL
  Filled 2022-12-23 (×4): qty 10

## 2022-12-23 MED ORDER — OXYCODONE HCL 5 MG PO TABS
5.0000 mg | ORAL_TABLET | ORAL | 0 refills | Status: DC | PRN
  Filled 2022-12-23: qty 36, 6d supply, fill #0

## 2022-12-23 NOTE — Anesthesia Postprocedure Evaluation (Signed)
Anesthesia Post Note  Patient: [name]  Procedure(s) Performed: EXCISION OF right knee WOUND AND  hematoma evacuation (Right: Knee) APPLICATION OF SKIN SUBSTITUTE MYRIAD (Right: Knee) APPLICATION OF WOUND VAC (Right: Knee)     Patient location during evaluation: PACU Anesthesia Type: General Level of consciousness: awake and alert Pain management: pain level controlled Vital Signs Assessment: post-procedure vital signs reviewed and stable Respiratory status: spontaneous breathing, nonlabored ventilation, respiratory function stable and patient connected to nasal cannula oxygen Cardiovascular status: blood pressure returned to baseline and stable Postop Assessment: no apparent nausea or vomiting Anesthetic complications: no  No notable events documented.  Last Vitals:  Vitals:   12/23/22 0241 12/23/22 0756  BP: 123/79 (!) 153/90  Pulse: 81 72  Resp: 16 16  Temp: 36.6 C 36.8 C  SpO2: 100% 100%    Last Pain:  Vitals:   12/23/22 1216  TempSrc:   PainSc: 10-Worst pain ever                 Barnet Glasgow
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
## 2022-12-23 NOTE — Progress Notes (Signed)
Mobility Specialist - Progress Note   12/23/22 1605  Mobility  Activity Ambulated with assistance in hallway  Level of Assistance Standby assist, set-up cues, supervision of patient - no hands on  Assistive Device Front wheel walker  Distance Ambulated (ft) 350 ft  Activity Response Tolerated well  Mobility Referral Yes  $Mobility charge 1 Mobility   Pt was received in bed and agreeable to mobility. No complaints throughout session. Pt was returned to bed with all needs met.  Franki Monte  Mobility Specialist Please contact via Solicitor or Rehab office at [phone]

## 2022-12-23 NOTE — Progress Notes (Signed)
1 Day Post-Op  Subjective:  Postop day 1 status post excision of right knee wound with hematoma evacuation and application of skin substitute Patient doing well today. Insurance does cover wound VAC compatible with her dressing  Objective: Vital signs in last 24 hours: Temp:  [97.9 F (36.6 C)-98.3 F (36.8 C)] 98 F (36.7 C) (02/22 1541) Pulse Rate:  [72-93] 81 (02/22 1541) Resp:  [15-19] 19 (02/22 1541) BP: (123-156)/(74-92) 134/76 (02/22 1541) SpO2:  [94 %-100 %] 100 % (02/22 1541) Last BM Date : 12/22/22   Physical Exam:  General: Pt resting comfortably in no acute distress Wound VAC in place, good seal, no surrounding redness, no significant tenderness with palpation   Assessment/Plan: s/p Procedure(s): EXCISION OF right knee WOUND AND  hematoma evacuation APPLICATION OF SKIN SUBSTITUTE MYRIAD APPLICATION OF WOUND VAC   Postop day 1 status post excision of right knee wound with hematoma evacuation and application of skin substitute  - Patient doing well today - Her insurance will not cover wound VAC compatible with her dressing, she will be receiving [employer] wound VAC, nursing staff currently trying to find an adapter to avoid having to remove entire system.  If they were able to find an adapter compatible with her wound VAC she may be discharged from our perspective with outpatient follow-up in our clinic for wound VAC changes.  If she is unable to find an adapter we will plan for wound VAC change tomorrow at bedside - Our office will reach out to her to schedule follow-up evaluation   Elmer Ramp, PA-C 12/23/2022

## 2022-12-23 NOTE — TOC Transition Note (Addendum)
Transition of Care [facility]) - CM/SW Discharge Note   Patient Details  Name: [name] MRN: [id number] Date of Birth: April 22, 1956  [facility] CM/SW Contact:  Verdell Carmine, RN Phone Number: 12/23/2022, 11:44 AM   Clinical Narrative:    Met with patient in room, introduced self and explained role. Patient is aware that she will not qualify for Home health at this time. She still has wound vac on. She stated that the MD said she would switch to a smaller vac ( proveena?) If she need wound vac though, she will have to go to wound clinic 3 x a week for dressing changes. She states she does have transportation, her son will be helping her.   Messaged team regarding question of changing vac to proveena prior to DC. She is already set up with PT at [facility]. She has a walker in the room delivered from [employer] to take home. No further needs identified.  1200 touched base with MD. Ortho will order wound vac from home with dressing changes 2 times a week. Emailed Architectural technologist from 3 M for e sign  to [email or web address] 1314 Leontine Locket from [place] called this RNCM, they do not have a contract with [employer]. Will have to go through [employer].   [place] with [employer].  Gave her patients information for wound vac. She states she needs measurements, ( in Plastic surgery notes) and documented that the patient is on a regular diet. 1540 D/W Plastic Surgery Adapter available in the OR. Faxed over paper for DME/ wound vac to [employer] [phone] x 2.  Did not get it. Suella Grove will come to retrieve paperwork. Message sent to Providers and RN's in regards to potential DC this evening. If adapter works patient could be DC from surgical standpoint, if not then they will come change the dressing in the AM so the new vac can be added , since the vac from [employer] is not compatible with 70M/[employer]  1700 no adapter compatible in OR or that adapt has. In Speaking with Plastic surgery PA  earlier, they will not be able to do dressing change tonight. The patient will get dressing change by them in AM, and wound vac will be brought by [employer] in AM.  Message sent to providers and team to make sure all were communicated with. The patient could DC after vac change.  Final next level of care: Home/Self Care Barriers to Discharge: No Barriers Identified   Patient Goals and CMS Choice      Discharge Placement                         Discharge Plan and Services Additional resources added to the After Visit Summary for     Discharge Planning Services: CM Consult            DME Arranged: Gilford Rile rolling DME Agency: [employer] Date DME Agency Contacted: 12/21/22 Time DME Agency Contacted: [id number] Representative spoke with at DME Agency: [place] ([place]) Interventions SDOH Screenings   Food Insecurity: No Food Insecurity (07/10/2021)  Housing: Low Risk  (07/10/2021)  Transportation Needs: No Transportation Needs (07/10/2021)  Alcohol Screen: Low Risk  (07/10/2021)  Depression (PHQ2-9): Low Risk  (12/02/2022)  Financial Resource Strain: Low Risk  (07/10/2021)  Physical Activity: Sufficiently Active (07/10/2021)  Social Connections: Moderately Integrated (07/10/2021)  Stress: No Stress Concern  Present (07/10/2021)  Tobacco Use: Medium Risk (12/23/2022)     Readmission Risk Interventions     No data to display
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
## 2022-12-23 NOTE — Care Management (Signed)
Patient is currently on a regular diet Dietary supplements per dietition
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
## 2022-12-23 NOTE — Discharge Instructions (Addendum)
Dear [name],   Thank you so much for allowing [place] to be part of your care!  You were admitted to [facility] for a wound infection. You were treated with IV antibiotics and surgery. We will not be discharging you with any antibiotics.    POST-HOSPITAL & CARE INSTRUCTIONS Follow up with plastic surgery for removal of your wound vac Please let PCP/Specialists know of any changes that were made.  Please see medications section of this packet for any medication changes.   DOCTOR'S APPOINTMENT & FOLLOW UP CARE INSTRUCTIONS  Future Appointments  Date Time Provider [place] [facility]  12/27/2022  1:30 PM Donney Dice, DO [facility]  01/10/2023 10:15 AM Regal, Tamala Fothergill, DPM [facility]    RETURN PRECAUTIONS: Please come back if you see worsening swelling, redness, or warmth from the wound.  If you start to have fevers or chills or pus like drainage from the wound   Take care and be well!  [facility]  [place], [place] [address] [phone]

## 2022-12-24 ENCOUNTER — Ambulatory Visit: Payer: Medicare HMO

## 2022-12-24 DIAGNOSIS — T148XXA Other injury of unspecified body region, initial encounter: Secondary | ICD-10-CM | POA: Diagnosis not present

## 2022-12-24 DIAGNOSIS — L089 Local infection of the skin and subcutaneous tissue, unspecified: Secondary | ICD-10-CM | POA: Diagnosis not present

## 2022-12-24 NOTE — Progress Notes (Signed)
2 Days Post-Op  Subjective:  Postop day #2 status post excision of right knee wound with hematoma evacuation and application of skin substitute  Patient is doing well  Objective: Vital signs in last 24 hours: Temp:  [98.1 F (36.7 C)-98.5 F (36.9 C)] 98.3 F (36.8 C) (02/23 0731) Pulse Rate:  [72-80] 79 (02/23 0731) Resp:  [18-19] 19 (02/23 0731) BP: (118-127)/(74-86) 127/74 (02/23 0731) SpO2:  [100 %] 100 % (02/23 0731) Last BM Date : 12/23/22   Physical Exam:  General: Pt resting comfortably in no acute distress Wound VAC in place with good suction no surrounding redness discharge or warmth or tenderness   Assessment/Plan: s/p Procedure(s): EXCISION OF right knee WOUND AND  hematoma evacuation APPLICATION OF SKIN SUBSTITUTE MYRIAD APPLICATION OF WOUND VAC   Patient is doing well today.  I was able to remove the tubing from the wound VAC apply the new tubing and hooked up to her new wound VAC.  This had good seal.  From a plastic surgery standpoint she is cleared for discharge she may follow-up in our office next week for repeat evaluation and wound VAC change.  She understands to reach out to our office with any questions or concerns.     Stevie Kern Promyse Ardito, PA-C 12/24/2022
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
## 2022-12-24 NOTE — TOC Transition Note (Addendum)
Transition of Care [facility]) - CM/SW Discharge Note   Patient Details  Name: [name] MRN: [id number] Date of Birth: [birth year]  Transition of Care [facility]) CM/SW Contact:  Verdell Carmine, RN Phone Number: 12/24/2022, 12:48 PM   Clinical Narrative:    Dressing changed by plastic surgery  to new vac from [employer]. They are wondering about supplies for dressing changes when she comes in the office. Sent message to [place] in [place] regarding this.  Patient can DC home. Follow up PT with [facility] office and plastic surgery will call her and schedule dressing changes. The first one will be Tuesday 2/27 the office will call her with appointment.  Plan sent to all ( providers and nursing) to make sure communication got to all.  The patient will have supplies sent to her via mail and would have to bring them to the office for dressing changes.   She has supplies for the first dressing change. She is aware of this.    Final next level of care: Home/Self Care Barriers to Discharge: No Barriers Identified   Patient Goals and CMS Choice      Discharge Placement                         Discharge Plan and Services Additional resources added to the After Visit Summary for     Discharge Planning Services: CM Consult            DME Arranged: Negative pressure wound device DME Agency: [employer] Date DME Agency Contacted: 12/23/22 Time DME Agency Contacted: [id number] Representative spoke with at DME Agency: [place] Determinants of Health ([place]) Interventions SDOH Screenings   Food Insecurity: No Food Insecurity (07/10/2021)  Housing: Low Risk  (07/10/2021)  Transportation Needs: No Transportation Needs (07/10/2021)  Alcohol Screen: Low Risk  (07/10/2021)  Depression (PHQ2-9): Low Risk  (12/02/2022)  Financial Resource Strain: Low Risk  (07/10/2021)  Physical Activity: Sufficiently Active (07/10/2021)  Social Connections: Moderately Integrated (07/10/2021)  Stress: No  Stress Concern Present (07/10/2021)  Tobacco Use: Medium Risk (12/23/2022)     Readmission Risk Interventions     No data to display

## 2022-12-24 NOTE — Plan of Care (Signed)
  Problem: Education: Goal: Knowledge of General Education information will improve Description: Including pain rating scale, medication(s)/side effects and non-pharmacologic comfort measures Outcome: Progressing   Problem: Clinical Measurements: Goal: Ability to maintain clinical measurements within normal limits will improve Outcome: Progressing Goal: Will remain free from infection Outcome: Progressing   Problem: Activity: Goal: Risk for activity intolerance will decrease Outcome: Progressing   Problem: Pain Managment: Goal: General experience of comfort will improve Outcome: Progressing   Problem: Safety: Goal: Ability to remain free from injury will improve Outcome: Progressing
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
## 2022-12-27 ENCOUNTER — Encounter: Payer: Self-pay | Admitting: *Deleted

## 2022-12-27 ENCOUNTER — Telehealth: Payer: Self-pay | Admitting: Plastic Surgery

## 2022-12-27 ENCOUNTER — Ambulatory Visit: Payer: Medicare HMO | Admitting: Family Medicine

## 2022-12-27 ENCOUNTER — Telehealth: Payer: Self-pay | Admitting: *Deleted

## 2022-12-27 NOTE — Transitions of Care (Post Inpatient/ED Visit) (Signed)
   12/27/2022  Name: [name] MRN: [id number] DOB: [birth year]  Today's TOC FU Call Status: Today's TOC FU Call Status:: Unsuccessul Call (1st Attempt) Unsuccessful Call (1st Attempt) Date: 12/27/22  Attempted to reach the patient regarding the most recent Inpatient visit; Received automated outgoing voice message stating that patient's voice mail box is full; unable to leave voice message requesting call back   Follow Up Plan: Additional outreach attempts will be made to reach the patient to complete the Transitions of Care (Post Inpatient visit) call.   Oneta Rack, RN, BSN, CCRN Alumnus RN CM Care Coordination/ Transition of [place] Management [phone]: direct office

## 2022-12-27 NOTE — Telephone Encounter (Signed)
Patient's wound vac tubing head separated from the knee wound vac, she reports she has been able to reapply the tubing head and suction is working fine, but would like assistance on securing it better.  I was able to assist her and it is functioning and secure now. Notified her to wrap ACE bandage over the knee where VAC head attaches. She understood.  We will see her tomorrow AM at 9AM. I offered appt for today, but she would prefer to wait until tomorrow.

## 2022-12-27 NOTE — Telephone Encounter (Signed)
Speaking with patient now

## 2022-12-27 NOTE — Telephone Encounter (Signed)
[name] wound vacc change pt, is on phone, stating her tube came out, she said she called the wound vac company that issued the wound vac and they were not able to help her, she has apt tomorrow, she wants to know what to do, please help advise.

## 2022-12-28 ENCOUNTER — Telehealth: Payer: Self-pay | Admitting: *Deleted

## 2022-12-28 ENCOUNTER — Ambulatory Visit (INDEPENDENT_AMBULATORY_CARE_PROVIDER_SITE_OTHER): Payer: Medicare HMO | Admitting: Physician Assistant

## 2022-12-28 DIAGNOSIS — S81001D Unspecified open wound, right knee, subsequent encounter: Secondary | ICD-10-CM

## 2022-12-28 NOTE — Transitions of Care (Post Inpatient/ED Visit) (Signed)
   12/28/2022  Name: [name] MRN: [id number] DOB: Sep 21, 1956  Today's TOC FU Call Status: Today's TOC FU Call Status:: Unsuccessful Call (2nd Attempt) Unsuccessful Call (2nd Attempt) Date: 12/28/22  Attempted to reach the patient regarding the most recent Inpatient/ED visit.  Follow Up Plan: Additional outreach attempts will be made to reach the patient to complete the Transitions of Care (Post Inpatient/ED visit) call.   [facility] [phone]
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
## 2022-12-29 ENCOUNTER — Encounter: Payer: Self-pay | Admitting: *Deleted

## 2022-12-29 ENCOUNTER — Telehealth: Payer: Self-pay | Admitting: *Deleted

## 2022-12-29 NOTE — Transitions of Care (Post Inpatient/ED Visit) (Signed)
   12/29/2022  Name: SHAKERA NICOLICH MRN: [id number] DOB: Aug 29, 1956  Today's TOC FU Call Status: Today's TOC FU Call Status:: Unsuccessful Call (3rd Attempt) Unsuccessful Call (3rd Attempt) Date: 12/29/22  Attempted to reach the patient regarding the most recent Inpatient visit; Received automated outgoing voice message stating that patient's voice mail box is full; unable to leave message requesting call back   Follow Up Plan: No further outreach attempts will be made at this time. We have been unable to contact the patient.  Oneta Rack, RN, BSN, CCRN Alumnus RN CM Care Coordination/ Transition of [place] Management [phone]: direct office

## 2022-12-30 ENCOUNTER — Encounter (HOSPITAL_COMMUNITY): Payer: Self-pay | Admitting: Plastic Surgery

## 2022-12-30 ENCOUNTER — Telehealth: Payer: Self-pay | Admitting: *Deleted

## 2022-12-30 ENCOUNTER — Other Ambulatory Visit (HOSPITAL_COMMUNITY): Payer: Self-pay

## 2022-12-30 NOTE — Telephone Encounter (Signed)
Pt reports that her sulcrafate is now $95 / month.  Advised that it sounded as if that med was not preferred by her insurance.  She will call and see if they can give her any idea of what medications would be covered. Christen Bame, CMA

## 2022-12-31 ENCOUNTER — Ambulatory Visit (INDEPENDENT_AMBULATORY_CARE_PROVIDER_SITE_OTHER): Payer: Medicare HMO | Admitting: Physician Assistant

## 2022-12-31 DIAGNOSIS — S81001D Unspecified open wound, right knee, subsequent encounter: Secondary | ICD-10-CM

## 2022-12-31 NOTE — Progress Notes (Signed)
This is a 67 year old female who is status post excision of right knee wound with hematoma evacuation and application of skin substitute on 12/22/2022.  She was last seen in the office on 12/28/2022.  We did wound VAC change at that time.  Since her last office visit she denies any significant complaints or concerns.  She notes that her wound VAC supplies have not arrived and are supposed to be arriving tonight.  On exam the wound VAC is in place with good suction, minimal serous output in the canister, no surrounding redness warmth or swelling.  Fortunately patient is doing very well today.  She does not have her wound care supplies and we have no supplies compatible with her wound VAC here.  The wound VAC is only been on for several days, she will be okay to keep it on until next week, we will plan to change at that time.  She was given strict return precautions.  She verbalized understanding and agreement to today's plan had no further questions or concerns.

## 2023-01-04 ENCOUNTER — Ambulatory Visit (INDEPENDENT_AMBULATORY_CARE_PROVIDER_SITE_OTHER): Payer: Medicare HMO | Admitting: Physician Assistant

## 2023-01-04 DIAGNOSIS — S81001D Unspecified open wound, right knee, subsequent encounter: Secondary | ICD-10-CM | POA: Diagnosis not present

## 2023-01-04 NOTE — Progress Notes (Signed)
This is a 67 year old female who is status post excision of right knee wound with hematoma evacuation and application of skin substitute on 12/22/2022 by Dr. Marla Roe.  She was last seen in the office on 12/31/2022.  At that time she had been doing well.  She did not have wound care supplies were unable to change the wound VAC.  Since her last office visit she denies any complaints or concerns, she denies any fever or significant pain at the knee.  On exam the wound VAC is in place, once removed there was an approximate 8 x 6 cm x 0.25 cm wound with minimal exudate, early minimal granulation tissue and incorporated myriad.  Wound edges are clean no surrounding redness, no warmth to touch.    Overall the patient is doing well.  We did change her wound VAC today, she tolerated this without difficulty.  Like to see her back in 1 week for repeat evaluation or sooner as needed.  She verbalized understanding and agreement to today's plan had no further questions or concerns.

## 2023-01-05 DIAGNOSIS — S81001D Unspecified open wound, right knee, subsequent encounter: Secondary | ICD-10-CM | POA: Diagnosis not present

## 2023-01-07 ENCOUNTER — Other Ambulatory Visit: Payer: Self-pay

## 2023-01-07 MED ORDER — GABAPENTIN 100 MG PO CAPS: 100.0000 mg | ORAL_CAPSULE | Freq: Three times a day (TID) | ORAL | 0 refills | Status: DC | PRN

## 2023-01-10 ENCOUNTER — Ambulatory Visit: Payer: Medicare HMO | Admitting: Podiatry

## 2023-01-11 ENCOUNTER — Ambulatory Visit (INDEPENDENT_AMBULATORY_CARE_PROVIDER_SITE_OTHER): Payer: Medicare HMO | Admitting: Physician Assistant

## 2023-01-11 ENCOUNTER — Encounter: Payer: Self-pay | Admitting: Physician Assistant

## 2023-01-11 VITALS — BP 155/82 | HR 94 | Ht 64.0 in | Wt 172.0 lb

## 2023-01-11 DIAGNOSIS — S81001D Unspecified open wound, right knee, subsequent encounter: Secondary | ICD-10-CM

## 2023-01-11 NOTE — Progress Notes (Signed)
This is a 67 year old female who is status post excision of right knee wound with hematoma evacuation and application of skin substitute on 12/22/2022 by Dr. Marla Roe.   She was last seen in the office on 01/04/2023.  At that time she had been doing well.  She did not have wound care supplies were unable to change the wound VAC.   Since her last office visit she denies any complaints or concerns, she denies any fever or significant pain at the knee.   On exam the wound VAC is in place, once removed there was an approximate 9 x 6.5 cm x 0.25 cm wound with minimal exudate,  minimal granulation tissue and incorporated myriad.  Wound edges are clean no surrounding redness, no warmth to touch.    Overall [name] seems to be doing well, she has had significant improvement in granulation tissue when compared to her previous visit.  No signs of infection.  The wound VAC was changed without issue.  I would like to see the patient back in our office in 1 week for repeat evaluation or sooner as needed.  The patient verbalized understanding and agreement to today's plan had no further questions or concerns.

## 2023-01-12 ENCOUNTER — Ambulatory Visit: Payer: Medicare HMO | Admitting: Family Medicine
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
## 2023-01-18 ENCOUNTER — Ambulatory Visit (INDEPENDENT_AMBULATORY_CARE_PROVIDER_SITE_OTHER): Payer: Medicare HMO | Admitting: Surgical

## 2023-01-18 ENCOUNTER — Telehealth: Payer: Self-pay | Admitting: *Deleted

## 2023-01-18 ENCOUNTER — Encounter: Payer: Self-pay | Admitting: Surgical

## 2023-01-18 DIAGNOSIS — S81001D Unspecified open wound, right knee, subsequent encounter: Secondary | ICD-10-CM | POA: Diagnosis not present

## 2023-01-18 NOTE — Telephone Encounter (Signed)
Request for surgical clearance and for pt to hold ASA 7 days prior to surgery faxed to Dr. McDiarmid with confirmation received
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
## 2023-01-19 ENCOUNTER — Telehealth: Payer: Self-pay | Admitting: *Deleted

## 2023-01-19 NOTE — Telephone Encounter (Signed)
FYI-  I received a call from [place] with [facility] regarding a referral for patient to start PT with them for her knee. Due to patient still having a wound vac and an open wound, they will need to postpone PT until this is healed.  She will call patient and give her an update and just wanted providers here to know as well.

## 2023-01-20 NOTE — Telephone Encounter (Signed)
Surgical clearance received from Dr. McDiarmid's office. Pt is cleared for surgery and may hold ASA 7 days prior to surgery. Sent to batch scan and copy forwarded to [facility]

## 2023-01-24 ENCOUNTER — Encounter: Payer: Self-pay | Admitting: Family Medicine

## 2023-01-24 ENCOUNTER — Other Ambulatory Visit: Payer: Self-pay | Admitting: Family Medicine

## 2023-01-24 ENCOUNTER — Ambulatory Visit (INDEPENDENT_AMBULATORY_CARE_PROVIDER_SITE_OTHER): Payer: Medicare HMO | Admitting: Family Medicine

## 2023-01-24 VITALS — BP 146/93 | HR 79 | Ht 64.0 in | Wt 175.2 lb

## 2023-01-24 DIAGNOSIS — I10 Essential (primary) hypertension: Secondary | ICD-10-CM

## 2023-01-24 DIAGNOSIS — T148XXA Other injury of unspecified body region, initial encounter: Secondary | ICD-10-CM

## 2023-01-24 DIAGNOSIS — I872 Venous insufficiency (chronic) (peripheral): Secondary | ICD-10-CM | POA: Diagnosis not present

## 2023-01-24 DIAGNOSIS — E876 Hypokalemia: Secondary | ICD-10-CM | POA: Diagnosis not present

## 2023-01-24 DIAGNOSIS — Z23 Encounter for immunization: Secondary | ICD-10-CM | POA: Diagnosis not present

## 2023-01-24 DIAGNOSIS — L089 Local infection of the skin and subcutaneous tissue, unspecified: Secondary | ICD-10-CM | POA: Diagnosis not present

## 2023-01-24 NOTE — Progress Notes (Signed)
    SUBJECTIVE:   CHIEF COMPLAINT / HPI:   Patient presents for hospital follow up. Seen by wound care as well but changes her dressing daily at home. Sees the wound specialist twice a week now. Changes bandage everyday and the covering over it every other day. Denies any pus or bleeding but sometimes gets clear drainage. Denies fever or chills since discharge from the hospital. Sometimes gets painful at night when she lays down with the pressure but otherwise no pain. She has oxycodone for the pain but has not needed it that often. Follows with plastic surgery for wound vac, next appointment is tomorrow. She just changed her dressing before she left. She was told by wound care and plastic surgery that her wound is healing nicely. She is scheduled to start working with PT once her skin graft is completed.   OBJECTIVE:   BP (!) 146/93   Pulse 79   Ht 5\' 4"  (1.626 m)   Wt 175 lb 4 oz (79.5 kg)   SpO2 100%   BMI 30.08 kg/m   General: Patient well-appearing, in no acute distress. CV: RRR, no murmurs or gallops auscultated Resp: CTAB, no wheezing, rales or rhonchi  Ext: right foot and LE to tibia covered in clean and dry dressing without bleeding or drainage noted, no left LE edema  ASSESSMENT/PLAN:   Wound infection -patient doing well following recent hospitalization, no systemic signs of infection  -continue to follow up with wound care -continue to follow with plastic surgery, scheduled for skin graft soon -scheduled to participate in PT after surgery -follow up scheduled with PCP on 4/18  Hypertension -initially elevated but improved to 146/93 on repeat -continue HCTZ and olmesartan daily  Hypokalemia -noted to be hypokalemic during hospitalization, instructed to take K supplementation -continue oral K supplementation -pending BMP, will notify patient of results and any appropriate changes to treatment    -PHQ-9 score of 0 reviewed.   Donney Dice, [place]

## 2023-01-24 NOTE — Assessment & Plan Note (Signed)
-  patient doing well following recent hospitalization, no systemic signs of infection  -continue to follow up with wound care -continue to follow with plastic surgery, scheduled for skin graft soon -scheduled to participate in PT after surgery -follow up scheduled with PCP on 4/18

## 2023-01-24 NOTE — Assessment & Plan Note (Signed)
-  noted to be hypokalemic during hospitalization, instructed to take K supplementation -continue oral K supplementation -pending BMP, will notify patient of results and any appropriate changes to treatment

## 2023-01-24 NOTE — Patient Instructions (Addendum)
It was great seeing you today!  Today we discussed your wound, I am glad that you are feeling better after your recent hospital visit. Please make sure to continue to follow with wound care and plastic surgery. Make sure to participate in physical therapy when you are able to.  Your blood pressure was slightly high, please continue to take your olemsartan and hydrochlorothiazide daily.   We will get blood work, I will let you know of any abnormal results.   Please follow up at your next scheduled appointment on 4/18, if anything arises between now and then, please don't hesitate to contact our office.   Thank you for allowing [place] to be a part of your medical care!  Thank you, Dr. Larae Grooms  Also a reminder of our clinic's no-show policy. Please make sure to arrive at least 15 minutes prior to your scheduled appointment time. Please try to cancel before 24 hours if you are not able to make it. If you no-show for 2 appointments then you will be receiving a warning letter. If you no-show after 3 visits, then you may be at risk of being dismissed from our clinic. This is to ensure that everyone is able to be seen in a timely manner. Thank you, we appreciate your assistance with this!

## 2023-01-24 NOTE — Assessment & Plan Note (Signed)
-  initially elevated but improved to 146/93 on repeat -continue HCTZ and olmesartan daily

## 2023-01-25 ENCOUNTER — Ambulatory Visit (INDEPENDENT_AMBULATORY_CARE_PROVIDER_SITE_OTHER): Payer: Medicare HMO | Admitting: Surgical

## 2023-01-25 ENCOUNTER — Encounter: Payer: Self-pay | Admitting: Surgical

## 2023-01-25 VITALS — BP 160/95 | HR 74

## 2023-01-25 DIAGNOSIS — S81001D Unspecified open wound, right knee, subsequent encounter: Secondary | ICD-10-CM | POA: Diagnosis not present

## 2023-01-25 LAB — BASIC METABOLIC PANEL
BUN/Creatinine Ratio: 15 (ref 12–28)
BUN: 11 mg/dL (ref 8–27)
CO2: 21 mmol/L (ref 20–29)
Calcium: 9.1 mg/dL (ref 8.7–10.3)
Chloride: 110 mmol/L — ABNORMAL HIGH (ref 96–106)
Creatinine, Ser: 0.73 mg/dL (ref 0.57–1.00)
Glucose: 87 mg/dL (ref 70–99)
Potassium: 4.3 mmol/L (ref 3.5–5.2)
Sodium: 144 mmol/L (ref 134–144)
eGFR: 91 mL/min/{1.73_m2} (ref >59)
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
## 2023-01-26 ENCOUNTER — Telehealth: Payer: Self-pay

## 2023-01-26 DIAGNOSIS — J301 Allergic rhinitis due to pollen: Secondary | ICD-10-CM

## 2023-01-26 NOTE — Telephone Encounter (Signed)
[name] should try Excedrin Tension Headache.  It will be safe for use with her stomach issues.
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
## 2023-01-31 NOTE — Telephone Encounter (Signed)
Excedrin Tension does not contain aspirin, Excedrin Migraine does.  She should be able to take Excedrin Tension.

## 2023-01-31 NOTE — Telephone Encounter (Signed)
Spoke with patient. She informed me that she can't take Excedrin either. Due to it have Asprin in it. With her having stomach issues. Wanted to know is there something else she could take.

## 2023-02-01 ENCOUNTER — Encounter: Payer: Self-pay | Admitting: Physician Assistant

## 2023-02-01 ENCOUNTER — Telehealth: Payer: Self-pay | Admitting: *Deleted

## 2023-02-01 ENCOUNTER — Ambulatory Visit (INDEPENDENT_AMBULATORY_CARE_PROVIDER_SITE_OTHER): Payer: Medicare HMO | Admitting: Physician Assistant

## 2023-02-01 VITALS — BP 152/88 | HR 78 | Ht 64.0 in | Wt 169.0 lb

## 2023-02-01 DIAGNOSIS — S81801A Unspecified open wound, right lower leg, initial encounter: Secondary | ICD-10-CM | POA: Diagnosis not present

## 2023-02-01 DIAGNOSIS — S81001D Unspecified open wound, right knee, subsequent encounter: Secondary | ICD-10-CM

## 2023-02-01 NOTE — Telephone Encounter (Signed)
Faxed order to [facility] for supplies for the patient.    Supplies:Gauze (4x4):Every other day                Adaptic Dressing:Every other day                Sug. Lube:Every other day  Confirmation received and copy scanned into the chart.   Received Order Status Notification from [facility] stating:[facility] has provided service for the patient;no further action is required.//AB/CMA
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
## 2023-02-01 NOTE — Telephone Encounter (Signed)
Patient wanted to know. If she can request  the medication flonase as well to be prescribed? Salvatore Marvel, CMA

## 2023-02-02 DIAGNOSIS — M25511 Pain in right shoulder: Secondary | ICD-10-CM | POA: Diagnosis not present

## 2023-02-02 MED ORDER — FLUTICASONE PROPIONATE 50 MCG/ACT NA SUSP: 2.0000 | Freq: Every day | NASAL | 5 refills | Status: DC

## 2023-02-02 NOTE — Telephone Encounter (Signed)
It is safe for [name] to use Flonase nose spray. A prescription for Flonase was sent into her pharmacy.  I recommend using it daily throughout the pollen season. The tree pollen season should end around the beginning of May.

## 2023-02-02 NOTE — Telephone Encounter (Signed)
Spoke with patient. Informed her that her Rx was sent in. Patient understood. Salvatore Marvel, CMA

## 2023-02-02 NOTE — Addendum Note (Signed)
Addended by: Lissa Morales D on: 02/02/2023 08:44 AM   Modules accepted: Orders

## 2023-02-03 NOTE — Telephone Encounter (Signed)
Pt called requesting the "roll of guaze" Kevlix. Pt says she received the individual 4x4 Gaze. Pt also requesting the Ace Wrap. Pt also notes she received Adaptic Touch 2x3 and is unsure what that means. Call pt [phone]

## 2023-02-03 NOTE — Telephone Encounter (Signed)
Spoke to patient since [name] is out of the office today. I informed that the adaptic and KY is to be placed directly on the wound, then covered with the 4 x 4 and tape or 4 x 4 and Kerlix/ace.   I filled out a [employer] form requesting Kerlix, ace wrap, and Medipore tape. Faxed to [employer] with confirmed receipt.  I also explained that the surgery authorization has been pending since 3/27. Once insurance approves the skin graft, our office will call to schedule a time for surgery.  Patient confirmed understanding on all issues and explanations.

## 2023-02-04 ENCOUNTER — Telehealth: Payer: Self-pay | Admitting: *Deleted

## 2023-02-04 DIAGNOSIS — S81801A Unspecified open wound, right lower leg, initial encounter: Secondary | ICD-10-CM | POA: Diagnosis not present

## 2023-02-04 NOTE — Telephone Encounter (Signed)
Received fax from [employer]: "[employer] has provided service for the patient; no further action is required."   I contacted patient and made aware.

## 2023-02-04 NOTE — Telephone Encounter (Signed)
Spoke with patient to schedule sx. She wants to wait until after her [birth year] to have surgery. Will have to call [employer] to extend dates.

## 2023-02-15 ENCOUNTER — Encounter: Payer: Medicare HMO | Admitting: Physician Assistant

## 2023-02-15 ENCOUNTER — Ambulatory Visit (INDEPENDENT_AMBULATORY_CARE_PROVIDER_SITE_OTHER): Payer: Medicare HMO | Admitting: Physician Assistant

## 2023-02-15 VITALS — BP 125/83 | HR 63

## 2023-02-15 DIAGNOSIS — S81001D Unspecified open wound, right knee, subsequent encounter: Secondary | ICD-10-CM

## 2023-02-15 MED ORDER — OXYCODONE-ACETAMINOPHEN 5-325 MG PO TABS: 1.0000 | ORAL_TABLET | Freq: Four times a day (QID) | ORAL | 0 refills | Status: DC | PRN
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
## 2023-02-17 ENCOUNTER — Encounter: Payer: Self-pay | Admitting: Family Medicine

## 2023-02-17 ENCOUNTER — Ambulatory Visit: Payer: Medicare HMO | Admitting: Family Medicine

## 2023-02-24 ENCOUNTER — Other Ambulatory Visit: Payer: Self-pay | Admitting: Family Medicine

## 2023-02-25 ENCOUNTER — Other Ambulatory Visit: Payer: Self-pay | Admitting: Physician Assistant

## 2023-03-01 ENCOUNTER — Ambulatory Visit (INDEPENDENT_AMBULATORY_CARE_PROVIDER_SITE_OTHER): Payer: Medicare HMO | Admitting: Physician Assistant

## 2023-03-01 VITALS — BP 142/87 | HR 77

## 2023-03-01 DIAGNOSIS — S81001D Unspecified open wound, right knee, subsequent encounter: Secondary | ICD-10-CM

## 2023-03-01 MED ORDER — ENOXAPARIN SODIUM 40 MG/0.4ML IJ SOSY: 40.0000 mg | PREFILLED_SYRINGE | INTRAMUSCULAR | 0 refills | Status: DC

## 2023-03-01 NOTE — Progress Notes (Signed)
   Referring Provider McDiarmid, Leighton Roach, MD [address] [place],  [place] [address]   CC:  Chief Complaint  Patient presents with   Follow-up      [name] is an 67 y.o. female.   HPI: This is a 67 year old female with a history of wound to her right knee presenting to our office for postoperative follow-up.  The patient was last seen in the office on 02/15/2023.  She had previously undergone excision of right knee wound with hematoma evacuation and application of wound matrix by Dr. Ulice Bold on 12/22/2022.  She was doing wound VAC changes for approximately 1 month and was subsequently switched over to Adaptic and KY.  She notes that since her last office visit she has had no significant changes.  She notes she is doing well with no significant pain, no fever, no swelling.  She continues to do daily dressing changes.  Review of Systems General: Positive for wound, negative for fever  Physical Exam    03/01/2023    8:58 AM 02/15/2023    1:38 PM 02/15/2023    9:18 AM  Vitals with BMI  Systolic 142 125 604  Diastolic 87 83 83  Pulse 77 63 63    General:  No acute distress,  Alert and oriented, Non-Toxic, Normal speech and affect Right knee wound measuring approximately 4 x 6 cm with good granulation tissue, clean wound edges, no surrounding redness swelling, no discharge  Assessment/Plan Overall the patient is doing well postoperatively, her wound continues to heal appropriately.  She has no signs of infection.  We are planning for skin graft in approximately 2 weeks, at this point the patient does not require any further follow-up until the skin graft unless she notices any dramatic changes or has any concerning signs or symptoms.  The patient will continue Adaptic and KY.  She verbalized understanding and agreement to today's plan.  [name] 03/01/2023, 9:34 AM

## 2023-03-01 NOTE — H&P (View-Only) (Signed)
   Referring Provider McDiarmid, Todd D, MD [address] [place],  [place] [address]   CC:  Chief Complaint  Patient presents with   Follow-up      [name] is an 66 y.o. female.   HPI: This is a 66-year-old female with a history of wound to her right knee presenting to our office for postoperative follow-up.  The patient was last seen in the office on 02/15/2023.  She had previously undergone excision of right knee wound with hematoma evacuation and application of wound matrix by Dr. Dillingham on 12/22/2022.  She was doing wound VAC changes for approximately 1 month and was subsequently switched over to Adaptic and KY.  She notes that since her last office visit she has had no significant changes.  She notes she is doing well with no significant pain, no fever, no swelling.  She continues to do daily dressing changes.  Review of Systems General: Positive for wound, negative for fever  Physical Exam    03/01/2023    8:58 AM 02/15/2023    1:38 PM 02/15/2023    9:18 AM  Vitals with BMI  Systolic 142 125 125  Diastolic 87 83 83  Pulse 77 63 63    General:  No acute distress,  Alert and oriented, Non-Toxic, Normal speech and affect Right knee wound measuring approximately 4 x 6 cm with good granulation tissue, clean wound edges, no surrounding redness swelling, no discharge  Assessment/Plan Overall the patient is doing well postoperatively, her wound continues to heal appropriately.  She has no signs of infection.  We are planning for skin graft in approximately 2 weeks, at this point the patient does not require any further follow-up until the skin graft unless she notices any dramatic changes or has any concerning signs or symptoms.  The patient will continue Adaptic and KY.  She verbalized understanding and agreement to today's plan.  [name] [name] 03/01/2023, 9:34 AM         

## 2023-03-02 ENCOUNTER — Ambulatory Visit: Payer: Medicare HMO | Admitting: Podiatry

## 2023-03-02 ENCOUNTER — Encounter: Payer: Self-pay | Admitting: Podiatry

## 2023-03-02 DIAGNOSIS — M79675 Pain in left toe(s): Secondary | ICD-10-CM | POA: Diagnosis not present

## 2023-03-02 DIAGNOSIS — M79674 Pain in right toe(s): Secondary | ICD-10-CM

## 2023-03-02 DIAGNOSIS — B351 Tinea unguium: Secondary | ICD-10-CM | POA: Diagnosis not present

## 2023-03-02 NOTE — Progress Notes (Signed)
Subjective:   Patient ID: [name], female   DOB: 67 y.o.   MRN: [id number]   HPI Patient presents with nail disease 1-5 both feet that are thick brittle and can become painful   ROS      Objective:  Physical Exam  Thick yellow brittle nailbeds 1-5 both feet painful     Assessment:  Chronic mycotic nail infection with pain 1-5 both feet     Plan:  Debridement painful nailbeds 1-5 both feet neurogenic bleeding reappoint to recheck

## 2023-03-02 NOTE — Progress Notes (Signed)
Reviewed and agree with Dr Koval's plan.   

## 2023-03-03 ENCOUNTER — Ambulatory Visit (INDEPENDENT_AMBULATORY_CARE_PROVIDER_SITE_OTHER): Payer: Medicare HMO | Admitting: Family Medicine

## 2023-03-03 ENCOUNTER — Encounter: Payer: Self-pay | Admitting: Family Medicine

## 2023-03-03 VITALS — BP 115/74 | HR 86 | Ht 64.0 in | Wt 166.8 lb

## 2023-03-03 DIAGNOSIS — E2839 Other primary ovarian failure: Secondary | ICD-10-CM

## 2023-03-03 DIAGNOSIS — J302 Other seasonal allergic rhinitis: Secondary | ICD-10-CM | POA: Diagnosis not present

## 2023-03-03 DIAGNOSIS — E1169 Type 2 diabetes mellitus with other specified complication: Secondary | ICD-10-CM | POA: Diagnosis not present

## 2023-03-03 DIAGNOSIS — Z9884 Bariatric surgery status: Secondary | ICD-10-CM | POA: Diagnosis not present

## 2023-03-03 DIAGNOSIS — K219 Gastro-esophageal reflux disease without esophagitis: Secondary | ICD-10-CM | POA: Diagnosis not present

## 2023-03-03 DIAGNOSIS — G4700 Insomnia, unspecified: Secondary | ICD-10-CM

## 2023-03-03 DIAGNOSIS — I10 Essential (primary) hypertension: Secondary | ICD-10-CM | POA: Diagnosis not present

## 2023-03-03 DIAGNOSIS — I152 Hypertension secondary to endocrine disorders: Secondary | ICD-10-CM

## 2023-03-03 DIAGNOSIS — E119 Type 2 diabetes mellitus without complications: Secondary | ICD-10-CM | POA: Diagnosis not present

## 2023-03-03 DIAGNOSIS — E1159 Type 2 diabetes mellitus with other circulatory complications: Secondary | ICD-10-CM

## 2023-03-03 DIAGNOSIS — E66811 Obesity, class 1: Secondary | ICD-10-CM

## 2023-03-03 DIAGNOSIS — E669 Obesity, unspecified: Secondary | ICD-10-CM

## 2023-03-03 DIAGNOSIS — E785 Hyperlipidemia, unspecified: Secondary | ICD-10-CM

## 2023-03-03 LAB — POCT GLYCOSYLATED HEMOGLOBIN (HGB A1C): HbA1c, POC (controlled diabetic range): 5.9 % (ref 0.0–7.0)

## 2023-03-03 MED ORDER — OZEMPIC (2 MG/DOSE) 8 MG/3ML ~~LOC~~ SOPN: 2.0000 mg | PEN_INJECTOR | SUBCUTANEOUS | 11 refills | Status: AC

## 2023-03-03 MED ORDER — HYDROCHLOROTHIAZIDE 12.5 MG PO CAPS: 12.5000 mg | ORAL_CAPSULE | Freq: Every day | ORAL | 3 refills | Status: DC

## 2023-03-03 MED ORDER — ADULT MULTIVITAMIN LIQUID CH: 5.0000 mL | Freq: Every day | ORAL | 3 refills | Status: DC

## 2023-03-03 MED ORDER — SUCRALFATE 1 G PO TABS: 1.0000 g | ORAL_TABLET | Freq: Three times a day (TID) | ORAL | 3 refills | Status: DC

## 2023-03-03 MED ORDER — ATORVASTATIN CALCIUM 40 MG PO TABS: 40.0000 mg | ORAL_TABLET | Freq: Every day | ORAL | 3 refills | Status: DC

## 2023-03-03 MED ORDER — GABAPENTIN 100 MG PO CAPS: 100.0000 mg | ORAL_CAPSULE | Freq: Three times a day (TID) | ORAL | 5 refills | Status: DC | PRN

## 2023-03-03 NOTE — Patient Instructions (Addendum)
[facility] should call you within the week to schedule a bone density test to see if you need special bone building medications.   Instead of using the liquid sucralfate, dissolve one sucralfate tablet in two large tablespoons of liquid and drink four times a day with meals and at bedtime.   Your A1c is 5.9% which is excellent.  Your diabetes is under very good control  Keep taking your Ozempic.    Your blood pressure is also under great control.  Keep taking your Hydrochlorothiazide and Olmesartan blood pressure medicines.    Just take one atorvastatin 40 mg tablet a day.   It would be a good idea to have your eye exam done.

## 2023-03-04 ENCOUNTER — Encounter: Payer: Self-pay | Admitting: Family Medicine

## 2023-03-04 NOTE — Assessment & Plan Note (Signed)
Established problem Well Controlled. Patient is at goal of < 140/90. No signs of complications, medication side effects, or red flags. Continue current medications and other regiments.

## 2023-03-04 NOTE — Assessment & Plan Note (Signed)
Established problem Well Controlled and is at goal of A1c < 7.0%. No signs of complications, medication side effects, or red flags. Continue Ozempic 2 mg [place] weekly

## 2023-03-04 NOTE — Assessment & Plan Note (Signed)
Having cramps in hands and feet Was inadvertently taking Atorvastatin 40 mg tablet, two tablets daily instead of prescribed one daily. Recommend taking one Atorvastatin 40 mg tablet daily. If cramping continues, will do further electrolyte workup and reduce recommended statin dose.

## 2023-03-04 NOTE — Assessment & Plan Note (Addendum)
Established problem Well Controlled. Patient is at goal of acid reflux and indigestion. No signs of complications, medication side effects, or red flags. Cost of sucralfate slurry is near $100.  Changed formulation to sucralfate tablet, 1 gram, dissolve and swallow qac and qhs.

## 2023-03-04 NOTE — Assessment & Plan Note (Signed)
Wt Readings from Last 3 Encounters:  03/03/23 166 lb 12.8 oz (75.7 kg)  02/01/23 169 lb (76.7 kg)  01/24/23 175 lb 4 oz (79.5 kg)   Body mass index is 28.63 kg/m.  Continuing weight reduction with Ozempic 2 mg weekly. Tolerating Ozempic without abdominal pain or nausea.  Continue current Ozempic therapy

## 2023-03-04 NOTE — Assessment & Plan Note (Signed)
Established problem Well Controlled. Patient is at goal of Allergic symptom control. No signs of complications, medication side effects, or red flags. Continue OTC OAH and Flonase

## 2023-03-04 NOTE — Assessment & Plan Note (Signed)
Established problem. Stable. Patient is at goal of asymptomatic GI tract She is taking her vitamin and mineral supplements. No signs of complications, medication side effects, or red flags. Continue current medications and other regiments.

## 2023-03-04 NOTE — Progress Notes (Signed)
[name] is alone Sources of clinical information for visit is/are patient. Nursing assessment for this office visit was reviewed with the patient for accuracy and revision.     Previous Report(s) Reviewed: Recent Plastic Surgery note (4/30): plan for skin graft left knee in about 2 weeks.    03/03/2023    4:03 PM  Depression screen PHQ 2/9  Decreased Interest 0  Down, Depressed, Hopeless 0  PHQ - 2 Score 0  Altered sleeping 1  Tired, decreased energy 0  Change in appetite 0  Feeling bad or failure about yourself  0  Trouble concentrating 0  Moving slowly or fidgety/restless 0  Suicidal thoughts 0  PHQ-9 Score 1  Difficult doing work/chores Not difficult at all   [employer] Office Visit from 03/03/2023 in [facility] Office Visit from 01/24/2023 in [facility] Office Visit from 12/02/2022 in [facility]  Thoughts that you would be better off dead, or of hurting yourself in some way Not at all Not at all Not at all  PHQ-9 Total Score 1 0 1          01/24/2023    2:00 PM 12/02/2022    8:49 AM 09/30/2022   11:03 AM 08/26/2022   11:12 AM 03/25/2022    3:26 PM  Fall Risk   Falls in the past year? 1 0 1 0 1  Number falls in past yr: 0 0 0 0 0  Injury with Fall? 1 0 1 0 0       03/03/2023    4:03 PM 01/24/2023    2:00 PM 12/02/2022    8:49 AM  PHQ9 SCORE ONLY  PHQ-9 Total Score 1 0 1    There are no preventive care reminders to display for this patient.  Health Maintenance Due  Topic Date Due   Diabetic kidney evaluation - Urine ACR  Never done   FOOT EXAM  05/21/2022   Medicare Annual Wellness (AWV)  07/10/2022   OPHTHALMOLOGY EXAM  01/14/2023     History/P.E. limitations: none  There are no preventive care reminders to display for this patient.  Diabetes Health Maintenance Due  Topic Date Due   FOOT EXAM  05/21/2022   OPHTHALMOLOGY EXAM  01/14/2023   HEMOGLOBIN A1C  09/03/2023    Health  Maintenance Due  Topic Date Due   Diabetic kidney evaluation - Urine ACR  Never done   FOOT EXAM  05/21/2022   Medicare Annual Wellness (AWV)  07/10/2022   OPHTHALMOLOGY EXAM  01/14/2023     Chief Complaint  Patient presents with   A1c f/u   Diabetes    Left knee wound reduced in size and with very good granualation base.  No signs of infection.  --------------------------------------------------------------------------------------------------------------------------------------------- Visit Problem List with A/P  No problem-specific Assessment & Plan notes found for this encounter.

## 2023-03-04 NOTE — Assessment & Plan Note (Signed)
Established problem Well Controlled. Patient is at goal of adequate sleep. She uses the zolpidem infrequently.  We discess the risk of this medication for "hangover" reflex delay, cognitive impairment and falls with aging. Will discuss reduction in dose and/or gradual redcution in number dispenaed No signs of complications, medication side effects, or red flags. Continue current medications and other regiments.

## 2023-03-07 ENCOUNTER — Telehealth: Payer: Self-pay | Admitting: Plastic Surgery

## 2023-03-07 ENCOUNTER — Ambulatory Visit (HOSPITAL_COMMUNITY)
Admission: EM | Admit: 2023-03-07 | Discharge: 2023-03-07 | Disposition: A | Discharge status: Home / Self Care | Payer: Medicare HMO | Attending: Emergency Medicine | Admitting: Emergency Medicine

## 2023-03-07 ENCOUNTER — Encounter (HOSPITAL_COMMUNITY): Payer: Self-pay | Admitting: Emergency Medicine

## 2023-03-07 DIAGNOSIS — Z20822 Contact with and (suspected) exposure to covid-19: Secondary | ICD-10-CM | POA: Diagnosis not present

## 2023-03-07 NOTE — ED Triage Notes (Signed)
Pt presents for covid testing due to an exposure. Denies any symptoms.

## 2023-03-07 NOTE — Discharge Instructions (Signed)
COVID test is pending up to 24 hours, you will be notified of positive test results only  Current quarantine guidelines require quarantining if with fever then may return to activity  If positive for COVID based on your medical history you may receive antiviral treatment which helps to reduce the amount of virus in your body reducing symptoms and timeline that you are ill, does not fully take away illness, this medicine will be sent in at time of notification  You can take Tylenol and/or Ibuprofen as needed for fever reduction and pain relief.   For cough: honey 1/2 to 1 teaspoon (you can dilute the honey in water or another fluid).  You can also use guaifenesin and dextromethorphan for cough. You can use a humidifier for chest congestion and cough.  If you don't have a humidifier, you can sit in the bathroom with the hot shower running.      For sore throat: try warm salt water gargles, cepacol lozenges, throat spray, warm tea or water with lemon/honey, popsicles or ice, or OTC cold relief medicine for throat discomfort.   For congestion: take a daily anti-histamine like Zyrtec, Claritin, and a oral decongestant, such as pseudoephedrine.  You can also use Flonase 1-2 sprays in each nostril daily.   It is important to stay hydrated: drink plenty of fluids (water, gatorade/powerade/pedialyte, juices, or teas) to keep your throat moisturized and help further relieve irritation/discomfort.

## 2023-03-07 NOTE — ED Provider Notes (Signed)
[facility]    CSN: [id number] Arrival date & time: 03/07/23  1701      History   Chief Complaint Chief Complaint  Patient presents with   Covid Testing    HPI [name] is a 67 y.o. female.   Patient presents for evaluation for COVID testing after known exposure.  Denies all symptoms.  History of asthma, type 2 diabetes, hyperlipidemia, stroke.  Past Medical History:  Diagnosis Date   AC (acromioclavicular) joint arthritis 12/23/2017   Left side   Acute renal failure (HCC) 09/24/2016   Allergic rhinitis 05/27/2010   Qualifier: Diagnosis of  By: Mauricio Po MD, James     Allergy    Anemia    Anterior epistaxis 02/27/2021   Arthritis    knees   Asthma, cough variant 03/10/2012   Back pain 10/04/2012   Baker's cyst of knee, right 10/17/2019   Baker's cyst of knee, right 10/17/2019   Biliary sludge 04/08/2014   Taking ursodiol chronically     Blood in stool 10/07/2014   Blood transfusion without reported diagnosis    BMI 40.0-44.9, adult (HCC) 05/22/2021   Bunion 03/28/2014   Cellulitis of leg, left 09/14/2016   Cervical pain (neck) 03/26/2016   Chest pain 05/12/2015   Constipation 10/19/2013   Chronic in the setting of known diverticulosis and history of bleeds.     Contusion of left knee 12/03/2022   Contusion of right knee 12/03/2022   Cramping of hands 02/27/2016   CYST, KIDNEY, ACQUIRED 08/15/2007   Qualifier: Diagnosis of  By: Ludwig Clarks MD, [place]     DDD (degenerative disc disease), lumbar 04/14/2016   Managed by [facility] [id number]   DE QUERVAIN'S TENOSYNOVITIS 11/30/2010   Qualifier: Diagnosis of  By: Jeanice Lim MD, Kingsley Spittle     Degenerative arthritis of right shoulder region 08/2013   Diabetes mellitus (HCC) 05/05/2020   A1c 2010 = 6.5%.  No A1c greater than 6.5% since.    Diverticulosis    Diverticulosis of colon with hemorrhage    Hx  Of recurrent Diverticular bleeding - several prior admissions    Diverticulosis of colon with  hemorrhage,  s/p colectomy ileorectal anastamosis    Hx  Of recurrent Diverticular bleeding - 4 to 5  prior admissions for GI bleeding 2016 and before. Seen inhouse by [facility] in 2016   Diverticulosis of colon without hemorrhage 11/18/2014   Noted on colonoscopy 11/2014    Dry eye syndrome of bilateral lacrimal glands 12/23/2021   Dry eye syndrome of bilateral lacrimal glands 12/23/2021   Epistaxis 02/27/2021   Esophageal dysmotility 02/14/2017   Esophageal Manometry 02/11/17   External hemorrhoids without complication 12/28/2012   Family history of colon cancer in father diagnosed in 43s 06/21/2019   Gastrojejunal ulcer with hemorrhage    Gastrojejunal ulcer with hemorrhage    GERD (gastroesophageal reflux disease)    H/O total knee replacement, 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 Seen inhouse by [facility] in 2016  Hx of gastrointestinal hemorrhage 05/18/2019   Extending back 2001, 2007 bleeds. 05/2013 colonoscopy.  Dr. Arlyce Dice.  For hematochezia, first-degree relative with colon cancer, personal history adenomatous colon polyps.  Sessile, sigmoid polyp removed; path consistent with inflammatory polyp..  Dense pandiverticulosis.  No fresh or old blood encountered.  Presumed to have had diverticular bleed. 11/2014 colonoscopy For hematochezia.  Pandiverticul   Hyperlipidemia associated with type 2 diabetes mellitus (HCC) 02/13/2014   Hypertension    under control with meds., has been on med. > 20 yr.   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 Referred back to Dr Vonna Kotyk (MD)   Leg cramping 04/22/2014   Lesion of right lower eyelid 02/27/2021   Lipoma of lower extremity 03/07/2013   Overview:  RIGHT THIGH   Low back pain 03/26/2016   Lower GI bleed    Lumbar back pain 03/26/2016   Medial meniscus tear 1997   Right Knee   Mini stroke    Morbid obesity (HCC) 12/29/2006   Multiple open wounds of lower leg 11/12/2016   Muscle pain 05/26/2015   Nerve palsy, acute, left upper extremity 01/29/2022   Associated with acute, traumatic left shoulder dislocation and interventional reduction   Nocturnal leg cramps 06/11/2022   Onychomycosis of multiple toenails with type 2 diabetes mellitus and peripheral angiopathy (HCC) 12/18/2021   Orthostatic dizziness 08/26/2010   Qualifier: Diagnosis of  By: Earlene Plater DO, Alcario Drought     Osteoarthritis of both knees 03/12/2016   S/P bilateral knee replacment 2000   Osteoarthritis, bilateral hands/fingers 04/02/2021   Peripheral neuropathy 05/20/2017   Prediabetes 05/05/2020   Premature supraventricular beats 12/25/2015   EKG 12/25/15    Rash and nonspecific skin eruption 04/01/2017   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) 05/18/2019   CT knee 04/2019 :RIGHT knee prosthesis with moderate knee joint effusion   Rotator cuff rupture, complete 08/2013   right   Shoulder dislocation, Left, acute 01/29/2022   ED reduction of acute traumatic left shoulder dislocation with nerve palsy   Shoulder dislocation, Left, acute 01/29/2022   ED reduction of acute traumatic left shoulder dislocation with nerve palsy    Shoulder impingement 08/2013   right   Skin lesion of right lower extremity 05/27/2016   Small bowel obstruction (HCC) 12/12/2015   Snoring 08/24/2018   Stroke (HCC) 2006   Remote left lacunar infarct noted on CT head 2006, 2010    Subacromial bursitis 05/21/2013   Superficial thrombophlebitis 01/29/2016   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   See assessment/plan from 03/22/2019 office visit with colposcopic evaluation by Dr Araceli Bouche (FM) in [facility]  suspected   Tingling 11/17/2017   Type 2 diabetes mellitus with other specified complication, without long-term current use of insulin (HCC)    Ulcer    Ulcer at site of surgical anastomosis following bypass of stomach 05/18/2019   Vaginal bleeding 05/17/2019   See 03/12/2019 Telephone call note and Office visit 03/23/2019 with Denny Levy MD in [facility] clinic   Venous insufficiency of both lower extremities, Right > Left leg 10/12/2019   Venous stasis ulcer of left  ankle with fat layer exposed with varicose veins (HCC) 10/20/2016   Wears dentures    upper   Wears partial dentures    lower   Wound infection 12/18/2022    Patient Active Problem List   Diagnosis Date Noted   Obesity (BMI 30.0-34.9) 06/11/2022   Onychomycosis of multiple toenails with type 2 diabetes mellitus and peripheral angiopathy (HCC) 12/18/2021   Osteoarthritis, bilateral hands/fingers 04/02/2021   Controlled type 2 diabetes mellitus without complication, without long-term current use of insulin (HCC) 05/05/2020   Venous insufficiency of both lower extremities, Right > Left leg 10/12/2019   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   Esophageal dysmotility 02/14/2017   Degloving injury of lower extremity 11/12/2016   DDD (degenerative disc disease), lumbar 04/14/2016   History of stroke 02/13/2014   Hyperlipidemia associated with type 2 diabetes mellitus (HCC) 02/13/2014   Insomnia 08/29/2012   GERD (gastroesophageal reflux disease) 08/24/2012    Asthma, cough variant 03/10/2012   Hypertension associated with diabetes (HCC) 01/05/2012   Allergic rhinitis 05/27/2010    Past Surgical History:  Procedure Laterality Date   ABDOMINAL HYSTERECTOMY  ?1987   partial   APPLICATION OF WOUND VAC Right 12/22/2022   Procedure: APPLICATION OF WOUND VAC;  Surgeon: Peggye Form, DO;  Location: [facility] OR;  Service: Plastics;  Laterality: Right;   COLONOSCOPY  05/02/2000; 05/08/2013, 11/08/14   COSMETIC SURGERY  02/22/2014   skin removal surgery from lower abdomen    ENDOVENOUS ABLATION SAPHENOUS VEIN W/ LASER Right 08/12/2016   endovenous laser ablation right greater saphenous vein by Gretta Began MD    ENDOVENOUS ABLATION SAPHENOUS VEIN W/ LASER Left 10/21/2016   EVLA L GSV by Gretta Began MD   ESOPHAGOGASTRODUODENOSCOPY N/A 05/17/2019   Procedure: ESOPHAGOGASTRODUODENOSCOPY (EGD);  Surgeon: Iva Boop, MD;  Location: [facility] ENDOSCOPY;  Service: Endoscopy;  Laterality: N/A;   HEMATOMA EVACUATION Right 12/22/2022   Procedure: EXCISION OF right knee WOUND AND  hematoma evacuation;  Surgeon: Peggye Form, DO;  Location: [facility] OR;  Service: Plastics;  Laterality: Right;   HEMOSTASIS CLIP PLACEMENT  05/17/2019   Procedure: HEMOSTASIS CLIP PLACEMENT;  Surgeon: Iva Boop, MD;  Location: [facility] ENDOSCOPY;  Service: Endoscopy;;   METATARSAL OSTEOTOMY Right 1995   Right Bunion Repair   ROTATOR CUFF REPAIR Left 04/27/2018   ROUX-EN-Y GASTRIC BYPASS  05/07/2012   SCLEROTHERAPY  05/17/2019   Procedure: Susa Day;  Surgeon: Iva Boop, MD;  Location: [facility] ENDOSCOPY;  Service: Endoscopy;;   SHOULDER ARTHROSCOPY WITH ROTATOR CUFF REPAIR AND SUBACROMIAL DECOMPRESSION Right 08/09/2013   Procedure: RIGHT SHOULDER ARTHROSCOPY WITH SUBACROMIAL DECOMPRESSION, DISTAL CLAVICLE EXCISION AND ROTATOR CUFF REPAIR and release biceps;  Surgeon: Loreta Ave, MD;  Location: [facility];  Service: Orthopedics;  Laterality: Right;   SUBTOTAL COLECTOMY   11/24/2015   [facility]: laparoscopic-assisted subtotal colectomy with ileorectal anastomosis.   TOTAL KNEE ARTHROPLASTY Left    TOTAL KNEE ARTHROPLASTY Right     OB History   No obstetric history on file.      Home Medications    Prior to Admission medications   Medication Sig Start Date End Date Taking? Authorizing Provider  ascorbic acid (VITAMIN C) 250 MG tablet Take 250 mg by mouth daily.    [provider]  aspirin 81 MG chewable tablet Chew 81 mg by mouth daily.    [provider]  atorvastatin (LIPITOR) 40 MG tablet Take 1 tablet (40 mg total) by mouth daily  at 6 PM. TAKE ONE TABLET DAILY 03/03/23 02/26/24  McDiarmid, Leighton Roach, MD  calcium carbonate (OS-CAL) 600 MG TABS tablet Take 1 tablet (600 mg total) by mouth 2 (two) times daily with a meal. Patient taking differently: Take 600 mg by mouth daily with breakfast. 01/07/14   Dessa Phi, MD  cetirizine (ZYRTEC) 10 MG tablet Take 1 tablet (10 mg total) by mouth daily. Patient taking differently: Take 10 mg by mouth daily as needed for allergies. 05/19/16   Almon Hercules, MD  docusate sodium (COLACE) 100 MG capsule TAKE ONE CAPSULE BY MOUTH TWICE DAILY 02/24/23   McDiarmid, Leighton Roach, MD  Ferrous Sulfate (IRON SUPPLEMENT) 300 MG/6.8ML SOLN Take 5 mLs by mouth daily. 09/30/22   McDiarmid, Leighton Roach, MD  fluticasone (FLONASE) 50 MCG/ACT nasal spray Place 2 sprays into both nostrils daily. 02/02/23   McDiarmid, Leighton Roach, MD  furosemide (LASIX) 40 MG tablet Take 0.5 tablets (20 mg total) by mouth daily as needed (right leg edema). TAKE ONE TABLET EVERY DAY Patient taking differently: Take 40 mg by mouth every other day. 12/10/22   McDiarmid, Leighton Roach, MD  gabapentin (NEURONTIN) 100 MG capsule Take 1 capsule (100 mg total) by mouth 3 (three) times daily as needed. 03/03/23   McDiarmid, Leighton Roach, MD  hydrochlorothiazide (MICROZIDE) 12.5 MG capsule Take 1 capsule (12.5 mg total) by mouth daily. 03/03/23   McDiarmid, Leighton Roach, MD   Multiple Vitamin (MULTIVITAMIN) tablet Take 1 tablet by mouth daily. 01/07/14   Funches, Gerilyn Nestle, MD  olmesartan (BENICAR) 20 MG tablet Take 1 tablet (20 mg total) by mouth at bedtime. 03/25/22   McDiarmid, Leighton Roach, MD  omeprazole (PRILOSEC) 40 MG capsule TAKE ONE CAPSULE EACH DAY BEFORE BREAKFAST -OPEN CAPSULE AND PLACE IN LIQUID IF POSSIBLE Patient taking differently: Take 40 mg by mouth daily. 12/06/22   Iva Boop, MD  OZEMPIC, 2 MG/DOSE, 8 MG/3ML SOPN Inject 2 mg into the skin once a week. Monday 03/03/23   McDiarmid, Leighton Roach, MD  polyethylene glycol powder (GLYCOLAX/MIRALAX) 17 GM/SCOOP powder Take 17 g by mouth daily. Patient taking differently: Take 17 g by mouth daily as needed for mild constipation. 12/06/22 12/01/23  McDiarmid, Leighton Roach, MD  sucralfate (CARAFATE) 1 g tablet Take 1 tablet (1 g total) by mouth 4 (four) times daily -  with meals and at bedtime. 03/03/23 02/26/24  McDiarmid, Leighton Roach, MD  vitamin B-12 (CYANOCOBALAMIN) 1000 MCG tablet Take 1,000 mcg by mouth daily.    [provider]  vitamin E 180 MG (400 UNITS) capsule Take 400 Units by mouth daily.    [provider]  Vitamins A & D 5000-400 units CAPS Take 5,000 Units by mouth daily. 03/16/16   [provider]  zolpidem (AMBIEN) 5 MG tablet Take 1 tablet (5 mg total) by mouth at bedtime as needed for sleep. 01/25/23   McDiarmid, Leighton Roach, MD    Family History Family History  Problem Relation Age of Onset   Macular degeneration Mother    Diabetes Mother    Hypertension Mother    Cancer Father 28       Died from complications of colon CA   Colon cancer Father        died in his 81s   Macular degeneration Father    Glaucoma Father    Glaucoma Sister    Macular degeneration Sister    Diabetes Sister    Heart disease Sister    Diabetes Sister  Stroke Sister    Glaucoma Brother    Macular degeneration Brother    Colon polyps Brother    Diabetes Brother    Stroke Brother    Colon polyps Brother     Diabetes Brother    Diabetes Brother    Diabetes Brother    Hypertension Son    Esophageal cancer Neg Hx    Rectal cancer Neg Hx    Stomach cancer Neg Hx     Social History Social History   Tobacco Use   Smoking status: Former    Packs/day: 0.10    Years: 6.00    Additional pack years: 0.00    Total pack years: 0.60    Types: Cigarettes    Quit date: 1976    Years since quitting: 48.3   Smokeless tobacco: Never  Vaping Use   Vaping Use: Never used  Substance Use Topics   Alcohol use: Yes    Alcohol/week: 0.0 standard drinks of alcohol    Comment: occasional wine   Drug use: No     Allergies   Aspirin, Pantoprazole sodium, Nsaids, and Tolmetin   Review of Systems Review of Systems  Constitutional: Negative.   HENT: Negative.    Respiratory: Negative.    Cardiovascular: Negative.   Gastrointestinal: Negative.      Physical Exam Triage Vital Signs ED Triage Vitals  Enc Vitals Group     BP 03/07/23 1804 (!) 155/93     Pulse Rate 03/07/23 1804 87     Resp 03/07/23 1804 17     Temp 03/07/23 1804 98.3 F (36.8 C)     Temp Source 03/07/23 1804 Oral     SpO2 03/07/23 1804 99 %     Weight --      Height --      Head Circumference --      Peak Flow --      Pain Score 03/07/23 1805 0     Pain Loc --      Pain Edu? --      Excl. in GC? --    No data found.  Updated Vital Signs BP (!) 155/93 (BP Location: Right Arm)   Pulse 87   Temp 98.3 F (36.8 C) (Oral)   Resp 17   SpO2 99%   Visual Acuity Right Eye Distance:   Left Eye Distance:   Bilateral Distance:    Right Eye Near:   Left Eye Near:    Bilateral Near:     Physical Exam Constitutional:      Appearance: Normal appearance.  Eyes:     Extraocular Movements: Extraocular movements intact.  Pulmonary:     Effort: Pulmonary effort is normal.  Neurological:     Mental Status: She is alert and oriented to person, place, and time. Mental status is at baseline.      UC Treatments /  Results  Labs (all labs ordered are listed, but only abnormal results are displayed) Labs Reviewed  SARS CORONAVIRUS 2 (TAT 6-24 HRS)    EKG   Radiology No results found.  Procedures Procedures (including critical care time)  Medications Ordered in UC Medications - No data to display  Initial Impression / Assessment and Plan / UC Course  I have reviewed the triage vital signs and the nursing notes.  Pertinent labs & imaging results that were available during my care of the patient were reviewed by me and considered in my medical decision making (see chart for details).  Encounter for  laboratory testing for COVID-19 virus  Vital signs are stable patient is in no signs of distress nontoxic-appearing, asymptomatic at this time, COVID test is pending, based on medical history qualifies for antiviral if positive, discussed quarantine guidelines, discussed additional treatment management if symptoms are to occur, may follow-up with his urgent care as needed Final Clinical Impressions(s) / UC Diagnoses   Final diagnoses:  Encounter for laboratory testing for COVID-19 virus     Discharge Instructions      COVID test is pending up to 24 hours, you will be notified of positive test results only  Current quarantine guidelines require quarantining if with fever then may return to activity  If positive for COVID based on your medical history you may receive antiviral treatment which helps to reduce the amount of virus in your body reducing symptoms and timeline that you are ill, does not fully take away illness, this medicine will be sent in at time of notification  You can take Tylenol and/or Ibuprofen as needed for fever reduction and pain relief.   For cough: honey 1/2 to 1 teaspoon (you can dilute the honey in water or another fluid).  You can also use guaifenesin and dextromethorphan for cough. You can use a humidifier for chest congestion and cough.  If you don't have a  humidifier, you can sit in the bathroom with the hot shower running.      For sore throat: try warm salt water gargles, cepacol lozenges, throat spray, warm tea or water with lemon/honey, popsicles or ice, or OTC cold relief medicine for throat discomfort.   For congestion: take a daily anti-histamine like Zyrtec, Claritin, and a oral decongestant, such as pseudoephedrine.  You can also use Flonase 1-2 sprays in each nostril daily.   It is important to stay hydrated: drink plenty of fluids (water, gatorade/powerade/pedialyte, juices, or teas) to keep your throat moisturized and help further relieve irritation/discomfort.    ED Prescriptions   None    PDMP not reviewed this encounter.   Tarmara, Aguiar, NP 03/07/23 [phone]

## 2023-03-07 NOTE — Telephone Encounter (Signed)
Unable to leave voice mail due to mail box full but My Chart message sent to let her know that her sx time has changed to 8:30am and to please arrive by 6:30 am.

## 2023-03-08 LAB — SARS CORONAVIRUS 2 (TAT 6-24 HRS): SARS Coronavirus 2: NEGATIVE

## 2023-03-09 ENCOUNTER — Telehealth: Payer: Self-pay | Admitting: Plastic Surgery

## 2023-03-09 ENCOUNTER — Encounter (HOSPITAL_BASED_OUTPATIENT_CLINIC_OR_DEPARTMENT_OTHER): Payer: Self-pay | Admitting: Plastic Surgery

## 2023-03-09 NOTE — Telephone Encounter (Signed)
Pt called asking when she can start taking blood thinner medication sx is 03-16-23

## 2023-03-09 NOTE — Telephone Encounter (Signed)
I attempted to call her but the voicemail box was full, will try again

## 2023-03-14 ENCOUNTER — Telehealth: Payer: Self-pay

## 2023-03-14 NOTE — Telephone Encounter (Signed)
Patient calls nurse line regarding questions with lasix and HCTZ.   Patient is asking if she is supposed to be taking both Lasix and HCTZ. If so, she needs a refill on lasix.   Please advise.   Veronda Prude, RN

## 2023-03-15 ENCOUNTER — Encounter (HOSPITAL_BASED_OUTPATIENT_CLINIC_OR_DEPARTMENT_OTHER)
Admission: RE | Admit: 2023-03-15 | Discharge: 2023-03-15 | Disposition: A | Discharge status: Home / Self Care | Payer: Medicare HMO | Source: Ambulatory Visit | Attending: Plastic Surgery | Admitting: Plastic Surgery

## 2023-03-15 DIAGNOSIS — E119 Type 2 diabetes mellitus without complications: Secondary | ICD-10-CM | POA: Diagnosis not present

## 2023-03-15 DIAGNOSIS — Z7985 Long-term (current) use of injectable non-insulin antidiabetic drugs: Secondary | ICD-10-CM | POA: Diagnosis not present

## 2023-03-15 DIAGNOSIS — Z01812 Encounter for preprocedural laboratory examination: Secondary | ICD-10-CM | POA: Diagnosis not present

## 2023-03-15 DIAGNOSIS — I10 Essential (primary) hypertension: Secondary | ICD-10-CM | POA: Diagnosis not present

## 2023-03-15 DIAGNOSIS — S81001A Unspecified open wound, right knee, initial encounter: Secondary | ICD-10-CM | POA: Diagnosis not present

## 2023-03-15 DIAGNOSIS — X58XXXA Exposure to other specified factors, initial encounter: Secondary | ICD-10-CM | POA: Diagnosis not present

## 2023-03-15 LAB — BASIC METABOLIC PANEL
Anion gap: 10 (ref 5–15)
BUN: 17 mg/dL (ref 8–23)
CO2: 23 mmol/L (ref 22–32)
Calcium: 9.2 mg/dL (ref 8.9–10.3)
Chloride: 106 mmol/L (ref 98–111)
Creatinine, Ser: 0.99 mg/dL (ref 0.44–1.00)
GFR, Estimated: >60 mL/min (ref >60)
Glucose, Bld: 122 mg/dL — ABNORMAL HIGH (ref 70–99)
Potassium: 3.5 mmol/L (ref 3.5–5.1)
Sodium: 139 mmol/L (ref 135–145)

## 2023-03-15 MED ORDER — CHLORHEXIDINE GLUCONATE CLOTH 2 % EX PADS: 6.0000 | MEDICATED_PAD | Freq: Once | CUTANEOUS | Status: DC

## 2023-03-15 NOTE — Progress Notes (Signed)
Surgical soap given with instructions, pt verbalized understanding.  

## 2023-03-15 NOTE — Anesthesia Preprocedure Evaluation (Signed)
Anesthesia Evaluation  Patient identified by MRN, date of birth, ID band Patient awake    Reviewed: Allergy & Precautions, NPO status , Patient's Chart, lab work & pertinent test results  History of Anesthesia Complications Negative for: history of anesthetic complications  Airway Mallampati: II  TM Distance: >3 FB Neck ROM: Full    Dental  (+) Edentulous Upper, Missing,    Pulmonary asthma , former smoker   Pulmonary exam normal        Cardiovascular hypertension, Pt. on medications + Peripheral Vascular Disease  Normal cardiovascular exam     Neuro/Psych CVA (2006), No Residual Symptoms    GI/Hepatic Neg liver ROS, PUD,GERD  Medicated,,  Endo/Other  diabetes (on Ozempic), Type 2    Renal/GU negative Renal ROS     Musculoskeletal  (+) Arthritis ,    Abdominal   Peds  Hematology negative hematology ROS (+)   Anesthesia Other Findings Day of surgery medications reviewed with patient.  Reproductive/Obstetrics                              Anesthesia Physical Anesthesia Plan  ASA: 2  Anesthesia Plan: General   Post-op Pain Management: Tylenol PO (pre-op)*   Induction: Intravenous  PONV Risk Score and Plan: 3 and Treatment may vary due to age or medical condition, Midazolam, Dexamethasone and Ondansetron  Airway Management Planned: LMA  Additional Equipment: None  Intra-op Plan:   Post-operative Plan: Extubation in OR  Informed Consent: I have reviewed the patients History and Physical, chart, labs and discussed the procedure including the risks, benefits and alternatives for the proposed anesthesia with the patient or authorized representative who has indicated his/her understanding and acceptance.     Dental advisory given  Plan Discussed with: CRNA  Anesthesia Plan Comments:         Anesthesia Quick Evaluation

## 2023-03-15 NOTE — Telephone Encounter (Signed)
Please ask [name] to stop her Lasix (furosemide) and continue the HCTZ.

## 2023-03-15 NOTE — Telephone Encounter (Signed)
Spoke with patient. Informed of note left by doctor. Patient understood. [name], CMA

## 2023-03-16 ENCOUNTER — Encounter (HOSPITAL_BASED_OUTPATIENT_CLINIC_OR_DEPARTMENT_OTHER): Payer: Self-pay | Admitting: Plastic Surgery

## 2023-03-16 ENCOUNTER — Encounter (HOSPITAL_BASED_OUTPATIENT_CLINIC_OR_DEPARTMENT_OTHER)
Admission: RE | Disposition: A | Discharge status: Home / Self Care | Payer: Self-pay | Source: Home / Self Care | Attending: Plastic Surgery

## 2023-03-16 ENCOUNTER — Ambulatory Visit (HOSPITAL_BASED_OUTPATIENT_CLINIC_OR_DEPARTMENT_OTHER): Payer: Medicare HMO | Admitting: Anesthesiology

## 2023-03-16 ENCOUNTER — Other Ambulatory Visit: Payer: Self-pay

## 2023-03-16 ENCOUNTER — Ambulatory Visit (HOSPITAL_BASED_OUTPATIENT_CLINIC_OR_DEPARTMENT_OTHER)
Admission: RE | Admit: 2023-03-16 | Discharge: 2023-03-16 | Disposition: A | Discharge status: Home / Self Care | Payer: Medicare HMO | Attending: Plastic Surgery | Admitting: Plastic Surgery

## 2023-03-16 DIAGNOSIS — S81001D Unspecified open wound, right knee, subsequent encounter: Secondary | ICD-10-CM

## 2023-03-16 DIAGNOSIS — Z01818 Encounter for other preprocedural examination: Secondary | ICD-10-CM

## 2023-03-16 DIAGNOSIS — E1151 Type 2 diabetes mellitus with diabetic peripheral angiopathy without gangrene: Secondary | ICD-10-CM

## 2023-03-16 DIAGNOSIS — S81001A Unspecified open wound, right knee, initial encounter: Secondary | ICD-10-CM | POA: Insufficient documentation

## 2023-03-16 DIAGNOSIS — Z87891 Personal history of nicotine dependence: Secondary | ICD-10-CM | POA: Diagnosis not present

## 2023-03-16 DIAGNOSIS — E119 Type 2 diabetes mellitus without complications: Secondary | ICD-10-CM | POA: Insufficient documentation

## 2023-03-16 DIAGNOSIS — J45909 Unspecified asthma, uncomplicated: Secondary | ICD-10-CM | POA: Diagnosis not present

## 2023-03-16 DIAGNOSIS — Z7985 Long-term (current) use of injectable non-insulin antidiabetic drugs: Secondary | ICD-10-CM | POA: Diagnosis not present

## 2023-03-16 DIAGNOSIS — I10 Essential (primary) hypertension: Secondary | ICD-10-CM | POA: Diagnosis not present

## 2023-03-16 DIAGNOSIS — X58XXXA Exposure to other specified factors, initial encounter: Secondary | ICD-10-CM | POA: Insufficient documentation

## 2023-03-16 HISTORY — PX: APPLICATION OF WOUND VAC: SHX5189

## 2023-03-16 HISTORY — PX: SKIN SPLIT GRAFT: SHX444

## 2023-03-16 LAB — GLUCOSE, CAPILLARY
Glucose-Capillary: 68 mg/dL — ABNORMAL LOW (ref 70–99)
Glucose-Capillary: 74 mg/dL (ref 70–99)

## 2023-03-16 SURGERY — APPLICATION, GRAFT, SKIN, SPLIT-THICKNESS
Anesthesia: General | Site: Knee | Laterality: Right

## 2023-03-16 MED ORDER — ONDANSETRON HCL 4 MG/2ML IJ SOLN
INTRAMUSCULAR | Status: DC | PRN
  Administered 2023-03-16: 4 mg via INTRAVENOUS

## 2023-03-16 MED ORDER — LIDOCAINE HCL (PF) 1 % IJ SOLN
INTRAMUSCULAR | Status: AC
  Filled 2023-03-16: qty 30

## 2023-03-16 MED ORDER — SODIUM CHLORIDE 0.9% FLUSH: 3.0000 mL | INTRAVENOUS | Status: DC | PRN

## 2023-03-16 MED ORDER — FENTANYL CITRATE (PF) 100 MCG/2ML IJ SOLN
INTRAMUSCULAR | Status: DC | PRN
  Administered 2023-03-16: 50 ug via INTRAVENOUS
  Administered 2023-03-16 (×2): 25 ug via INTRAVENOUS

## 2023-03-16 MED ORDER — MIDAZOLAM HCL 2 MG/2ML IJ SOLN
INTRAMUSCULAR | Status: AC
  Filled 2023-03-16: qty 2

## 2023-03-16 MED ORDER — OXYCODONE HCL 5 MG PO TABS: 5.0000 mg | ORAL_TABLET | Freq: Once | ORAL | Status: DC | PRN

## 2023-03-16 MED ORDER — DEXAMETHASONE SODIUM PHOSPHATE 10 MG/ML IJ SOLN
INTRAMUSCULAR | Status: AC
  Filled 2023-03-16: qty 1

## 2023-03-16 MED ORDER — ACETAMINOPHEN 500 MG PO TABS
1000.0000 mg | ORAL_TABLET | Freq: Once | ORAL | Status: AC
  Administered 2023-03-16: 1000 mg via ORAL

## 2023-03-16 MED ORDER — SUCCINYLCHOLINE CHLORIDE 200 MG/10ML IV SOSY
PREFILLED_SYRINGE | INTRAVENOUS | Status: AC
  Filled 2023-03-16: qty 10

## 2023-03-16 MED ORDER — BUPIVACAINE-EPINEPHRINE 0.25% -1:200000 IJ SOLN
INTRAMUSCULAR | Status: DC | PRN
  Administered 2023-03-16: 24 mL

## 2023-03-16 MED ORDER — 0.9 % SODIUM CHLORIDE (POUR BTL) OPTIME
TOPICAL | Status: DC | PRN
  Administered 2023-03-16: 1000 mL

## 2023-03-16 MED ORDER — DEXAMETHASONE SODIUM PHOSPHATE 4 MG/ML IJ SOLN
INTRAMUSCULAR | Status: DC | PRN
  Administered 2023-03-16: 5 mg via INTRAVENOUS

## 2023-03-16 MED ORDER — PHENYLEPHRINE 80 MCG/ML (10ML) SYRINGE FOR IV PUSH (FOR BLOOD PRESSURE SUPPORT)
PREFILLED_SYRINGE | INTRAVENOUS | Status: AC
  Filled 2023-03-16: qty 10

## 2023-03-16 MED ORDER — TRIAMCINOLONE ACETONIDE 40 MG/ML IJ SUSP
INTRAMUSCULAR | Status: AC
  Filled 2023-03-16: qty 5

## 2023-03-16 MED ORDER — OXYCODONE HCL 5 MG PO TABS: 5.0000 mg | ORAL_TABLET | ORAL | Status: DC | PRN

## 2023-03-16 MED ORDER — OXYMETAZOLINE HCL 0.05 % NA SOLN
NASAL | Status: AC
  Filled 2023-03-16: qty 30

## 2023-03-16 MED ORDER — LACTATED RINGERS IV SOLN: INTRAVENOUS | Status: DC

## 2023-03-16 MED ORDER — BUPIVACAINE-EPINEPHRINE (PF) 0.25% -1:200000 IJ SOLN
INTRAMUSCULAR | Status: AC
  Filled 2023-03-16: qty 30

## 2023-03-16 MED ORDER — BUPIVACAINE LIPOSOME 1.3 % IJ SUSP
INTRAMUSCULAR | Status: AC
  Filled 2023-03-16: qty 20

## 2023-03-16 MED ORDER — ONDANSETRON HCL 4 MG/2ML IJ SOLN
INTRAMUSCULAR | Status: AC
  Filled 2023-03-16: qty 2

## 2023-03-16 MED ORDER — OXYCODONE HCL 5 MG/5ML PO SOLN: 5.0000 mg | Freq: Once | ORAL | Status: DC | PRN

## 2023-03-16 MED ORDER — PROPOFOL 10 MG/ML IV BOLUS
INTRAVENOUS | Status: DC | PRN
  Administered 2023-03-16: 150 mg via INTRAVENOUS

## 2023-03-16 MED ORDER — BUPIVACAINE HCL (PF) 0.25 % IJ SOLN
INTRAMUSCULAR | Status: AC
  Filled 2023-03-16: qty 30

## 2023-03-16 MED ORDER — LIDOCAINE HCL (CARDIAC) PF 100 MG/5ML IV SOSY
PREFILLED_SYRINGE | INTRAVENOUS | Status: DC | PRN
  Administered 2023-03-16: 60 mg via INTRAVENOUS

## 2023-03-16 MED ORDER — LIDOCAINE 2% (20 MG/ML) 5 ML SYRINGE
INTRAMUSCULAR | Status: AC
  Filled 2023-03-16: qty 5

## 2023-03-16 MED ORDER — AMISULPRIDE (ANTIEMETIC) 5 MG/2ML IV SOLN: 10.0000 mg | Freq: Once | INTRAVENOUS | Status: DC | PRN

## 2023-03-16 MED ORDER — EPHEDRINE 5 MG/ML INJ
INTRAVENOUS | Status: AC
  Filled 2023-03-16: qty 5

## 2023-03-16 MED ORDER — LIDOCAINE 2% (20 MG/ML) 5 ML SYRINGE
INTRAMUSCULAR | Status: DC | PRN
  Administered 2023-03-16: 80 mg via INTRAVENOUS

## 2023-03-16 MED ORDER — ACETAMINOPHEN 500 MG PO TABS
ORAL_TABLET | ORAL | Status: AC
  Filled 2023-03-16: qty 2

## 2023-03-16 MED ORDER — ACETAMINOPHEN 325 MG RE SUPP: 650.0000 mg | RECTAL | Status: DC | PRN

## 2023-03-16 MED ORDER — DEXTROSE 50 % IV SOLN
INTRAVENOUS | Status: AC
  Filled 2023-03-16: qty 50

## 2023-03-16 MED ORDER — BACITRACIN-NEOMYCIN-POLYMYXIN OINTMENT TUBE
TOPICAL_OINTMENT | CUTANEOUS | Status: AC
  Filled 2023-03-16: qty 14.17

## 2023-03-16 MED ORDER — CEFAZOLIN SODIUM-DEXTROSE 2-4 GM/100ML-% IV SOLN
2.0000 g | INTRAVENOUS | Status: AC
  Administered 2023-03-16: 2 g via INTRAVENOUS

## 2023-03-16 MED ORDER — EPINEPHRINE PF 1 MG/ML IJ SOLN
INTRAMUSCULAR | Status: AC
  Filled 2023-03-16: qty 1

## 2023-03-16 MED ORDER — ATROPINE SULFATE 0.4 MG/ML IV SOLN
INTRAVENOUS | Status: AC
  Filled 2023-03-16: qty 1

## 2023-03-16 MED ORDER — ACETAMINOPHEN 325 MG PO TABS: 650.0000 mg | ORAL_TABLET | ORAL | Status: DC | PRN

## 2023-03-16 MED ORDER — CEFAZOLIN SODIUM-DEXTROSE 2-4 GM/100ML-% IV SOLN
INTRAVENOUS | Status: AC
  Filled 2023-03-16: qty 100

## 2023-03-16 MED ORDER — FENTANYL CITRATE (PF) 100 MCG/2ML IJ SOLN: 25.0000 ug | INTRAMUSCULAR | Status: DC | PRN

## 2023-03-16 MED ORDER — SODIUM CHLORIDE 0.9% FLUSH: 3.0000 mL | Freq: Two times a day (BID) | INTRAVENOUS | Status: DC

## 2023-03-16 MED ORDER — FENTANYL CITRATE (PF) 100 MCG/2ML IJ SOLN
INTRAMUSCULAR | Status: AC
  Filled 2023-03-16: qty 2

## 2023-03-16 MED ORDER — LIDOCAINE-EPINEPHRINE 1 %-1:100000 IJ SOLN
INTRAMUSCULAR | Status: AC
  Filled 2023-03-16: qty 1

## 2023-03-16 MED ORDER — SODIUM CHLORIDE (PF) 0.9 % IJ SOLN
INTRAMUSCULAR | Status: AC
  Filled 2023-03-16: qty 10

## 2023-03-16 MED ORDER — SODIUM CHLORIDE 0.9 % IV SOLN: 250.0000 mL | INTRAVENOUS | Status: DC | PRN

## 2023-03-16 SURGICAL SUPPLY — 70 items
ADH SKN CLS APL DERMABOND .7 (GAUZE/BANDAGES/DRESSINGS)
BAG DECANTER FOR FLEXI CONT (MISCELLANEOUS) IMPLANT
BALL CTTN LRG ABS STRL LF (GAUZE/BANDAGES/DRESSINGS)
BLADE CLIPPER SURG (BLADE) IMPLANT
BLADE DERMATOME SS (BLADE) IMPLANT
BLADE HEX COATED 2.75 (ELECTRODE) IMPLANT
BLADE SURG 10 STRL SS (BLADE) IMPLANT
BLADE SURG 15 STRL LF DISP TIS (BLADE) ×2 IMPLANT
BLADE SURG 15 STRL SS (BLADE) ×1
BNDG CMPR 5X3 KNIT ELC UNQ LF (GAUZE/BANDAGES/DRESSINGS)
BNDG CMPR 5X4 KNIT ELC UNQ LF (GAUZE/BANDAGES/DRESSINGS) ×2
BNDG CMPR 5X62 HK CLSR LF (GAUZE/BANDAGES/DRESSINGS)
BNDG CMPR 6"X 5 YARDS HK CLSR (GAUZE/BANDAGES/DRESSINGS)
BNDG ELASTIC 3INX 5YD STR LF (GAUZE/BANDAGES/DRESSINGS) IMPLANT
BNDG ELASTIC 4INX 5YD STR LF (GAUZE/BANDAGES/DRESSINGS) ×2 IMPLANT
BNDG ELASTIC 6INX 5YD STR LF (GAUZE/BANDAGES/DRESSINGS) IMPLANT
BNDG GAUZE DERMACEA FLUFF 4 (GAUZE/BANDAGES/DRESSINGS) ×2 IMPLANT
BNDG GZE DERMACEA 4 6PLY (GAUZE/BANDAGES/DRESSINGS) ×2
COTTONBALL LRG STERILE PKG (GAUZE/BANDAGES/DRESSINGS) IMPLANT
COVER BACK TABLE 60X90IN (DRAPES) ×2 IMPLANT
COVER MAYO STAND STRL (DRAPES) ×2 IMPLANT
DERMABOND ADVANCED .7 DNX12 (GAUZE/BANDAGES/DRESSINGS) IMPLANT
DERMACARRIERS GRAFT 1 TO 1.5 (DISPOSABLE)
DRAPE INCISE IOBAN 66X45 STRL (DRAPES) IMPLANT
DRAPE U-SHAPE 76X120 STRL (DRAPES) ×2 IMPLANT
DRSG ADAPTIC 3X8 NADH LF (GAUZE/BANDAGES/DRESSINGS) IMPLANT
DRSG CUTIMED SORBACT 7X9 (GAUZE/BANDAGES/DRESSINGS) IMPLANT
DRSG EMULSION OIL 3X3 NADH (GAUZE/BANDAGES/DRESSINGS) IMPLANT
DRSG HYDROCOLLOID 4X4 (GAUZE/BANDAGES/DRESSINGS) IMPLANT
DRSG OPSITE 6X11 MED (GAUZE/BANDAGES/DRESSINGS) IMPLANT
DRSG TEGADERM 4X10 (GAUZE/BANDAGES/DRESSINGS) IMPLANT
DRSG VAC GRANUFOAM SM (GAUZE/BANDAGES/DRESSINGS) IMPLANT
ELECT REM PT RETURN 9FT ADLT (ELECTROSURGICAL) ×1
ELECTRODE REM PT RTRN 9FT ADLT (ELECTROSURGICAL) IMPLANT
GAUZE PAD ABD 8X10 STRL (GAUZE/BANDAGES/DRESSINGS) IMPLANT
GAUZE SPONGE 4X4 12PLY STRL (GAUZE/BANDAGES/DRESSINGS) IMPLANT
GAUZE XEROFORM 5X9 LF (GAUZE/BANDAGES/DRESSINGS) IMPLANT
GLOVE BIO SURGEON STRL SZ 6.5 (GLOVE) ×2 IMPLANT
GOWN STRL REUS W/ TWL LRG LVL3 (GOWN DISPOSABLE) ×4 IMPLANT
GOWN STRL REUS W/TWL LRG LVL3 (GOWN DISPOSABLE) ×2
GRAFT DERMACARRIERS 1 TO 1.5 (DISPOSABLE) IMPLANT
KIT DRSG PREVENA PLUS 7DAY 125 (MISCELLANEOUS) IMPLANT
NDL HYPO 25X1 1.5 SAFETY (NEEDLE) ×2 IMPLANT
NEEDLE HYPO 25X1 1.5 SAFETY (NEEDLE) ×1 IMPLANT
NS IRRIG 1000ML POUR BTL (IV SOLUTION) ×2 IMPLANT
PACK BASIN DAY SURGERY FS (CUSTOM PROCEDURE TRAY) ×2 IMPLANT
PADDING CAST ABS COTTON 3X4 (CAST SUPPLIES) IMPLANT
PADDING CAST ABS COTTON 4X4 ST (CAST SUPPLIES) IMPLANT
PENCIL SMOKE EVACUATOR (MISCELLANEOUS) IMPLANT
SHEET MEDIUM DRAPE 40X70 STRL (DRAPES) ×2 IMPLANT
SPIKE FLUID TRANSFER (MISCELLANEOUS) IMPLANT
SPLINT FIBERGLASS 3X35 (CAST SUPPLIES) IMPLANT
SPLINT FIBERGLASS 4X30 (CAST SUPPLIES) IMPLANT
SPONGE T-LAP 18X18 ~~LOC~~+RFID (SPONGE) ×2 IMPLANT
STAPLER VISISTAT 35W (STAPLE) IMPLANT
SURGILUBE 2OZ TUBE FLIPTOP (MISCELLANEOUS) IMPLANT
SUT MON AB 5-0 PS2 18 (SUTURE) IMPLANT
SUT SILK 3 0 SH CR/8 (SUTURE) IMPLANT
SUT SILK 4 0 PS 2 (SUTURE) IMPLANT
SUT VIC AB 4-0 PS2 18 (SUTURE) IMPLANT
SUT VIC AB 5-0 P-3 18X BRD (SUTURE) IMPLANT
SUT VIC AB 5-0 P3 18 (SUTURE)
SUT VIC AB 5-0 PS2 18 (SUTURE) IMPLANT
SYR BULB IRRIG 60ML STRL (SYRINGE) ×2 IMPLANT
SYR CONTROL 10ML LL (SYRINGE) ×2 IMPLANT
TOWEL GREEN STERILE FF (TOWEL DISPOSABLE) ×2 IMPLANT
TRAY DSU PREP LF (CUSTOM PROCEDURE TRAY) ×2 IMPLANT
TUBE CONNECTING 20X1/4 (TUBING) IMPLANT
UNDERPAD 30X36 HEAVY ABSORB (UNDERPADS AND DIAPERS) IMPLANT
YANKAUER SUCT BULB TIP NO VENT (SUCTIONS) IMPLANT

## 2023-03-16 NOTE — Anesthesia Postprocedure Evaluation (Signed)
Anesthesia Post Note  Patient: [name]  Procedure(s) Performed: Split-thickness skin graft to right knee wound (Right: Knee) APPLICATION OF WOUND VAC (Right: Knee)     Patient location during evaluation: PACU Anesthesia Type: General Level of consciousness: awake and alert Pain management: pain level controlled Vital Signs Assessment: post-procedure vital signs reviewed and stable Respiratory status: spontaneous breathing, nonlabored ventilation and respiratory function stable Cardiovascular status: blood pressure returned to baseline Postop Assessment: no apparent nausea or vomiting Anesthetic complications: no   No notable events documented.  Last Vitals:  Vitals:   03/16/23 0945 03/16/23 0958  BP: (!) 154/92 (!) 162/98  Pulse: 69 64  Resp: 12   Temp:  (!) 36.1 C  SpO2: 100% 100%    Last Pain:  Vitals:   03/16/23 0958  TempSrc: Oral  PainSc: 0-No pain                 Shanda Howells

## 2023-03-16 NOTE — Anesthesia Procedure Notes (Signed)
Procedure Name: LMA Insertion Date/Time: 03/16/2023 8:40 AM  Performed by: Ronnette Hila, CRNAPre-anesthesia Checklist: Patient identified, Emergency Drugs available, Suction available and Patient being monitored Patient Re-evaluated:Patient Re-evaluated prior to induction Oxygen Delivery Method: Circle system utilized Preoxygenation: Pre-oxygenation with 100% oxygen Induction Type: IV induction Ventilation: Mask ventilation without difficulty LMA: LMA inserted LMA Size: 4.0 Number of attempts: 1 Airway Equipment and Method: Bite block Placement Confirmation: positive ETCO2 Tube secured with: Tape Dental Injury: Teeth and Oropharynx as per pre-operative assessment

## 2023-03-16 NOTE — Discharge Instructions (Addendum)
Don't remove VAC.  We will remove in the office at your next appointment. Do not get the VAC wet.  Do not turn it off or disconnect it.  Next dose of Tylenol at 1:30 today if    Post Anesthesia Home Care Instructions  Activity: Get plenty of rest for the remainder of the day. A responsible individual must stay with you for 24 hours following the procedure.  For the next 24 hours, DO NOT: -Drive a car -Advertising copywriter -Drink alcoholic beverages -Take any medication unless instructed by your physician -Make any legal decisions or sign important papers.  Meals: Start with liquid foods such as gelatin or soup. Progress to regular foods as tolerated. Avoid greasy, spicy, heavy foods. If nausea and/or vomiting occur, drink only clear liquids until the nausea and/or vomiting subsides. Call your physician if vomiting continues.  Special Instructions/Symptoms: Your throat may feel dry or sore from the anesthesia or the breathing tube placed in your throat during surgery. If this causes discomfort, gargle with warm salt water. The discomfort should disappear within 24 hours.  If you had a scopolamine patch placed behind your ear for the management of post- operative nausea and/or vomiting:  1. The medication in the patch is effective for 72 hours, after which it should be removed.  Wrap patch in a tissue and discard in the trash. Wash hands thoroughly with soap and water. 2. You may remove the patch earlier than 72 hours if you experience unpleasant side effects which may include dry mouth, dizziness or visual disturbances. 3. Avoid touching the patch. Wash your hands with soap and water after contact with the patch.

## 2023-03-16 NOTE — Op Note (Signed)
DATE OF OPERATION: 03/16/2023  LOCATION: [facility]  PREOPERATIVE DIAGNOSIS: Wound from trauma to right knee 3 x 5 cm  POSTOPERATIVE DIAGNOSIS: Same  PROCEDURE:  Excision of right knee wound skin and soft tissue 3 x 5 cm  Right knee placement of split thickness skin graft 3 x 5 cm Placement of VAC right knee  SURGEON: Sydney Hasten Sanger Zeffie Bickert, DO  ASSISTANT: Evelena Leyden, PA  EBL: none  CONDITION: Stable  COMPLICATIONS: None  INDICATION: The patient, [name], is a 67 y.o. female born on 05-Jun-1956, is here for treatment of a wound of the right knee from trauma.   PROCEDURE DETAILS:  The patient was seen prior to surgery and marked.  The IV antibiotics were given. The patient was taken to the operating room and given a general anesthetic. A standard time out was performed and all information was confirmed by those in the room. SCD was placed on the left leg.   The right leg was prepped and draped.  Local with epinephrine was injected into the right upper lateral thigh which was designated by the patient as the donor site.  The dermatome was set at 10/999 inch.  The 2 inch guard was utilized.  The skin was obtained.  The right knee wound was excised with the #10 blade and curette of nonviable skin and soft tissue of the 3 x 5 cm wound.  The graft was then placed on the wound and sutured in place with the 5-0 Vicryl.  Sorbact was applied and then the VAC.  There was an excellent seal.   The donor site was covered with the xeroform and then a dressing.  The leg was wrapped with kerlex and ace wrap.  The patient was allowed to wake up and taken to recovery room in stable condition at the end of the case. The family was notified at the end of the case.   The advanced practice practitioner (APP) assisted throughout the case.  The APP was essential in retraction and counter traction when needed to make the case progress smoothly.  This retraction and assistance made it  possible to see the tissue plans for the procedure.  The assistance was needed for blood control, tissue re-approximation and assisted with closure of the incision site.

## 2023-03-16 NOTE — Interval H&P Note (Signed)
History and Physical Interval Note:  03/16/2023 7:36 AM  [name]  has presented today for surgery, with the diagnosis of Open wound of right knee.  The various methods of treatment have been discussed with the patient and family. After consideration of risks, benefits and other options for treatment, the patient has consented to  Procedure(s): Split-thickness skin graft to right knee wound (Right) as a surgical intervention.  The patient's history has been reviewed, patient examined, no change in status, stable for surgery.  I have reviewed the patient's chart and labs.  Questions were answered to the patient's satisfaction.     Alena Bills Carmello Cabiness

## 2023-03-16 NOTE — Transfer of Care (Signed)
Immediate Anesthesia Transfer of Care Note  Patient: [name]  Procedure(s) Performed: Split-thickness skin graft to right knee wound (Right: Knee) APPLICATION OF WOUND VAC (Right: Knee)  Patient Location: PACU  Anesthesia Type:General  Level of Consciousness: awake, alert , drowsy, and patient cooperative  Airway & Oxygen Therapy: Patient Spontanous Breathing and Patient connected to face mask oxygen  Post-op Assessment: Report given to RN and Post -op Vital signs reviewed and stable  Post vital signs: Reviewed and stable  Last Vitals:  Vitals Value Taken Time  BP    Temp    Pulse 68 03/16/23 0919  Resp    SpO2 100 % 03/16/23 0919  Vitals shown include unvalidated device data.  Last Pain:  Vitals:   03/16/23 0723  TempSrc: Oral  PainSc: 3       Patients Stated Pain Goal: 3 (03/16/23 0723)  Complications: No notable events documented.

## 2023-03-17 ENCOUNTER — Encounter (HOSPITAL_BASED_OUTPATIENT_CLINIC_OR_DEPARTMENT_OTHER): Payer: Self-pay | Admitting: Plastic Surgery

## 2023-03-17 ENCOUNTER — Telehealth: Payer: Self-pay | Admitting: *Deleted

## 2023-03-17 NOTE — Progress Notes (Signed)
Patient is a pleasant 67 year old female with PMH of right knee wound after debridement and wound matrix placement now s/p STSG performed 03/16/2023 by Dr. Ulice Bold who joins via telephone for postoperative day 2 check-in.  The patient was at home and this provider was calling from their office.  A total of 8 minutes was spent speaking with the patient and reviewing chart.    She tells that she is doing well.  Pain relatively well-controlled, obtaining between Tylenol and oxycodone.  Her pain is predominantly at the skin graft harvest site, as expected.  She states that a bandage fell off of her skin graft harvest site, but she has replaced it.  The Xeroform has remained on.  Discussed adding small amount of Vaseline to it periodically to help keep it moist.  She denies any fevers, chest pain, difficulty breathing, leg swelling, or other symptoms/concerns since time of surgery.  She is ambulatory.  Eating and drinking well, denies any nausea.  She has been voiding, but no BM yet.  She is already scheduled for follow-up in office next week.  Will plan to see her then.  She will call the clinic should she have any questions or concerns over the weekend.

## 2023-03-17 NOTE — Telephone Encounter (Signed)
Pt called with concern that her donor site dressing was lifting up and she asked if she needed to change it. She denied any active bleeding at the site and stated that her VAC dressing was intact. Advised her to leave the donor site dressing in place and not change it.  Also that she could send a message with a picture through mychart if she had any trouble with the dressing. She verbalized understanding and stated she would call if she had any further concerns or questions.Marland Kitchen

## 2023-03-17 NOTE — Telephone Encounter (Signed)
Hi Joss, the donor site was dressed with Xeroform and overlying bordered Mepilex bandage. I was going to call her tomorrow afternoon to check-in on her, but if she is at all concerned and would like for [place] to replace her bordered Mepilex dressing here today I would be happy to take care of that for her. Per Dr. Ulice Bold, we do want to leave that Xeroform in place until next week if possible. Thanks.

## 2023-03-18 ENCOUNTER — Ambulatory Visit (INDEPENDENT_AMBULATORY_CARE_PROVIDER_SITE_OTHER): Payer: Medicare HMO | Admitting: Physician Assistant

## 2023-03-18 DIAGNOSIS — S81001D Unspecified open wound, right knee, subsequent encounter: Secondary | ICD-10-CM

## 2023-03-18 DIAGNOSIS — Z9889 Other specified postprocedural states: Secondary | ICD-10-CM

## 2023-03-22 ENCOUNTER — Ambulatory Visit (INDEPENDENT_AMBULATORY_CARE_PROVIDER_SITE_OTHER): Payer: Medicare HMO | Admitting: Plastic Surgery

## 2023-03-22 VITALS — BP 134/85 | HR 83

## 2023-03-22 DIAGNOSIS — S81809A Unspecified open wound, unspecified lower leg, initial encounter: Secondary | ICD-10-CM

## 2023-03-22 DIAGNOSIS — Z9889 Other specified postprocedural states: Secondary | ICD-10-CM

## 2023-03-22 NOTE — Progress Notes (Signed)
The patient is a 67 year old female here for follow-up on her right knee wound.  The VAC was removed and she is got the skin graft in place.  Looks like it has good take.  No hematoma or seroma under it.  The patient is to use Xeroform every other day and to keep it wrapped.  Avoid getting it wet if possible.  I would like to see her back in a week.  Will plan on getting a picture at that time.

## 2023-03-24 ENCOUNTER — Ambulatory Visit (INDEPENDENT_AMBULATORY_CARE_PROVIDER_SITE_OTHER): Payer: Medicare HMO

## 2023-03-24 DIAGNOSIS — M2012 Hallux valgus (acquired), left foot: Secondary | ICD-10-CM

## 2023-03-24 DIAGNOSIS — E1169 Type 2 diabetes mellitus with other specified complication: Secondary | ICD-10-CM

## 2023-03-24 DIAGNOSIS — M2141 Flat foot [pes planus] (acquired), right foot: Secondary | ICD-10-CM

## 2023-03-24 DIAGNOSIS — B351 Tinea unguium: Secondary | ICD-10-CM

## 2023-03-24 DIAGNOSIS — M2142 Flat foot [pes planus] (acquired), left foot: Secondary | ICD-10-CM

## 2023-03-24 DIAGNOSIS — E1151 Type 2 diabetes mellitus with diabetic peripheral angiopathy without gangrene: Secondary | ICD-10-CM

## 2023-03-24 DIAGNOSIS — M2041 Other hammer toe(s) (acquired), right foot: Secondary | ICD-10-CM

## 2023-03-24 DIAGNOSIS — M2042 Other hammer toe(s) (acquired), left foot: Secondary | ICD-10-CM

## 2023-03-24 DIAGNOSIS — M2011 Hallux valgus (acquired), right foot: Secondary | ICD-10-CM

## 2023-03-24 NOTE — Progress Notes (Signed)
Patient presents to the office today for diabetic shoe and insole measuring.  ABN signed.   Documentation of medical necessity will be sent to patient's treating diabetic doctor to verify and sign.   Patient's diabetic provider: McDiarmid, Leighton Roach, MD   Shoes and insoles will be ordered at that time and patient will be notified for an appointment for fitting when they arrive.   Patient shoe selection-   1st   Shoe choice:   [id number]  Shoe size ordered: 10w

## 2023-03-31 ENCOUNTER — Ambulatory Visit (INDEPENDENT_AMBULATORY_CARE_PROVIDER_SITE_OTHER): Payer: Medicare HMO | Admitting: Physician Assistant

## 2023-03-31 VITALS — BP 119/77 | HR 77

## 2023-03-31 DIAGNOSIS — S81001D Unspecified open wound, right knee, subsequent encounter: Secondary | ICD-10-CM

## 2023-03-31 NOTE — Progress Notes (Signed)
Patient is a pleasant 67 year old female with PMH of right knee wound after debridement and wound matrix placement now s/p STSG performed 03/16/2023 by Dr. Ulice Bold who presents to clinic for postoperative follow-up.  She was last seen here in clinic on 04/01/2023.  At that time, her Prevena wound VAC was removed and the skin graft was in place with good take.  No underlying hematoma or seroma.  Plan is for Xeroform to the graft placement site every other day.  Avoid getting it wet and return in 1 week for postoperative photos.  Today, patient is doing okay.  She denies any significant pain, asymmetric leg swelling, difficulty breathing, chest pain, fevers, or other symptoms.  She is concerned that her skin graft placement site is drying out.  She reports that she does not have any Xeroform and so she was instead using the Adaptic that had been previously provided through [facility].  She had run out of her K-Y jelly however and so no lubricants were being placed over top of the Adaptic.  As for the skin graft harvest site, she has been applying Vaseline over top of the Xeroform in an effort to help keep it moist.  On exam, the skin graft appears to have good take.  Several of the scattered sutures are removed without complication or difficulty.  Some skin darkening along the periphery of the wound and scabbing prohibit removal of all of the sutures at this time.  Instead, focus on moisturizing the area well and then attempting removal of any residual sutures in 2 weeks.  No surrounding erythema or induration otherwise concerning for infection.  No malodor.  Given that she has some Adaptic at home, will transition to Adaptic with K-Y jelly application daily for the next couple of weeks and then return to clinic for reevaluation.  The Adaptic can be changed every other day.  As for the skin graft harvest site, the Xeroform was gently removed and recommendations for Vaseline application daily and overlying bordered  Mepilex dressing.  She will call clinic should she have any questions or concerns.  Picture(s) obtained of the patient and placed in the chart were with the patient's or guardian's permission.

## 2023-04-01 ENCOUNTER — Encounter: Payer: Medicare HMO | Admitting: Physician Assistant

## 2023-04-11 ENCOUNTER — Encounter: Payer: Self-pay | Admitting: Family Medicine

## 2023-04-11 ENCOUNTER — Other Ambulatory Visit: Payer: Self-pay | Admitting: Family Medicine

## 2023-04-11 ENCOUNTER — Other Ambulatory Visit: Payer: Self-pay | Admitting: Internal Medicine

## 2023-04-11 NOTE — Progress Notes (Signed)
Order for therapeutic shoes via [facility] form was signed with copy most recent diabetes mellitus visit (03/03/23) with Kalib Bhagat was fax'd to [phone].

## 2023-04-12 ENCOUNTER — Telehealth: Payer: Self-pay | Admitting: *Deleted

## 2023-04-12 NOTE — Telephone Encounter (Addendum)
Received on (02/01/23) via of fax Order Status Notification from [facility].  Stating:[facility] has provided service for the patient; no further action is required.  Copy scanned into the chart.//AB/CMA

## 2023-04-13 NOTE — Progress Notes (Signed)
Patient is a pleasant 67 year old female with PMH of right knee wound after debridement and wound matrix placement now s/p STSG performed 03/16/2023 by Dr. Ulice Bold who presents to clinic for postoperative follow-up.   She was last seen here in clinic on 03/31/2023.  At that time, patient was doing well.  She was concerned that her graft placement site was drying out and she had been using Adaptic.  On exam, some skin darkening along periphery of wound was appreciated.  Recommended increased moisturization of the area and then removal of any residual sutures in 2 weeks.    Today, patient is doing well.  She is pleased with her wound healing.  She has been applying the Adaptic and K-Y jelly to her skin graft placement site, as instructed.  She has continued with Vaseline to her skin graft harvest site.  She denies any leg swelling, chest pain, difficulty breathing, fevers, or other concerns.    On exam, skin graft placement site has good take.  4.5 x 2.25 cm.  Improved moisturization compared to previous encounter.  Skin graft harvest site also has normal pigment coming through.  No drainage, erythema, cellulitic changes, malodor, or other concerning findings.  She is healing quite nicely.  Recommend continued K-Y jelly and Adaptic to the skin graft placement site, but can transition to simple Vaseline and bandage in 1 week.  As for his skin graft harvest site, recommend continued Vaseline.  She is doing excellent from a postoperative standpoint and is taking good care of her wounds.  Follow-up in 3 weeks for likely final postoperative encounter. Picture(s) obtained of the patient and placed in the chart were with the patient's or guardian's permission.

## 2023-04-14 ENCOUNTER — Encounter: Payer: Self-pay | Admitting: Physician Assistant

## 2023-04-14 ENCOUNTER — Ambulatory Visit (INDEPENDENT_AMBULATORY_CARE_PROVIDER_SITE_OTHER): Payer: Medicare HMO | Admitting: Physician Assistant

## 2023-04-14 VITALS — BP 149/96 | HR 79

## 2023-04-14 DIAGNOSIS — Z9889 Other specified postprocedural states: Secondary | ICD-10-CM

## 2023-04-14 DIAGNOSIS — S81001D Unspecified open wound, right knee, subsequent encounter: Secondary | ICD-10-CM

## 2023-05-03 ENCOUNTER — Other Ambulatory Visit: Payer: Medicare HMO

## 2023-05-04 NOTE — Progress Notes (Signed)
Patient is a pleasant 67 year old female with PMH of right knee wound after debridement and wound matrix placement now s/p STSG performed 03/16/2023 by Dr. Ulice Bold who presents to clinic for postoperative follow-up.   She was last seen here in clinic on 04/14/2023.  At that time, skin graft appeared to have good take.  Measured 4.5 x 2.25 cm.  Improved moisturization compared to previous encounter.  Skin graft harvest site also appears to be healing nicely.  Recommended continued K-Y jelly and Adaptic to the skin graft placement site as well as transition to Vaseline after 1 week.  Continued Vaseline to the skin graft harvest site.  Follow-up in 3 weeks.  Today, patient is doing well.  She states that her skin graft harvest site has completely healed and her STSG placement site has been getting better.  She states that it is still a bit tight when she performs flexion of the right knee.  However, she feels as though it is getting better and she is eager to start getting in water and having some of her restrictions lifted.  On exam, STSG placement site appears much healthier than it did compared to most recent encounter.  Improved color and it looks better incorporated.  However, mildly firm to palpation throughout.  STSG harvest site well-healed.    Recommend that she transition to silicone scar sheets or gels at the skin graft harvest site.  As for the STSG placement site, recommending that she massage the area with Vaseline daily.  This will help soften the area.  As for activity, she can increase as tolerated.  Would like for her to start performing more flexion of the knee in order to preserve good ROM as well as help with the elasticity of the STSG.  No evidence concerning for infection on exam.  She can shower or even jump in a pool if she would like, appears well-healed.  Recommend that she avoid any prolonged time in the water however that could cause it to become macerated.  Follow-up in 2  months, sooner if needed. Picture(s) obtained of the patient and placed in the chart were with the patient's or guardian's permission.

## 2023-05-06 ENCOUNTER — Ambulatory Visit (INDEPENDENT_AMBULATORY_CARE_PROVIDER_SITE_OTHER): Payer: Medicare HMO | Admitting: Physician Assistant

## 2023-05-06 VITALS — BP 121/80 | HR 80

## 2023-05-06 DIAGNOSIS — Z9889 Other specified postprocedural states: Secondary | ICD-10-CM

## 2023-05-06 DIAGNOSIS — S81001D Unspecified open wound, right knee, subsequent encounter: Secondary | ICD-10-CM

## 2023-05-12 ENCOUNTER — Other Ambulatory Visit (INDEPENDENT_AMBULATORY_CARE_PROVIDER_SITE_OTHER): Payer: Medicare HMO | Admitting: Pharmacist

## 2023-05-12 NOTE — Patient Instructions (Signed)
Asked patient to continue with previous plan.   I shared that I would ask Dr. McDiarmid to update dosing for Ozempic (semaglutide) with next prescription at upcoming visit.   She verbalized understanding.

## 2023-05-12 NOTE — Progress Notes (Signed)
Reviewed and agree with Dr Koval's plan.   

## 2023-05-12 NOTE — Progress Notes (Signed)
Pharmacy Quality Measure Review  This patient is appearing on a report for being at risk of failing the adherence measure for diabetes medications this calendar year.   Medication: Ozempic (semaglutide) 2mg  Last fill date: 04/04/2023 prescribed weekly.    Contacted patient and she reports that she is now taking every other week AND doing well with blood sugar control.   She reports that she has an upcoming appointment with Dr. McDiarmid in a few weeks.  I will ask Dr. McDiarmid to update the dosing with next prescription to accurately reflect current use.   Patient verbalized understanding of reason for call. Thanked me for the follow-up.

## 2023-05-19 ENCOUNTER — Other Ambulatory Visit: Payer: Medicare HMO

## 2023-05-26 ENCOUNTER — Ambulatory Visit: Payer: Medicare HMO | Admitting: Family Medicine

## 2023-05-27 ENCOUNTER — Other Ambulatory Visit: Payer: Self-pay | Admitting: Family Medicine

## 2023-05-31 ENCOUNTER — Other Ambulatory Visit: Payer: Medicare HMO

## 2023-06-03 ENCOUNTER — Encounter: Payer: Self-pay | Admitting: Podiatry

## 2023-06-03 ENCOUNTER — Ambulatory Visit (INDEPENDENT_AMBULATORY_CARE_PROVIDER_SITE_OTHER): Payer: Medicare HMO | Admitting: Podiatry

## 2023-06-03 DIAGNOSIS — E1151 Type 2 diabetes mellitus with diabetic peripheral angiopathy without gangrene: Secondary | ICD-10-CM

## 2023-06-03 DIAGNOSIS — B351 Tinea unguium: Secondary | ICD-10-CM

## 2023-06-03 DIAGNOSIS — E1169 Type 2 diabetes mellitus with other specified complication: Secondary | ICD-10-CM

## 2023-06-03 DIAGNOSIS — E119 Type 2 diabetes mellitus without complications: Secondary | ICD-10-CM

## 2023-06-03 NOTE — Progress Notes (Addendum)
This patient presents to the office with chief complaint of long thick painful nails.  Patient says the nails are painful walking and wearing shoes.  This patient is unable to self treat.  This patient is unable to trim her nails since she is unable to reach her nails.  She presents to the office for preventative foot care services.  General Appearance  Alert, conversant and in no acute stress.  Vascular  Dorsalis pedis and posterior tibial  pulses are  weakly palpable  bilaterally.  Capillary return is within normal limits  bilaterally. Temperature is within normal limits  bilaterally.  Neurologic  Senn-Weinstein monofilament wire test within normal limits  bilaterally. Muscle power within normal limits bilaterally.  Nails Thick disfigured discolored nails with subungual debris  from hallux to fifth toes bilaterally. No evidence of bacterial infection or drainage bilaterally.  Orthopedic  No limitations of motion  feet .  No crepitus or effusions noted.  No bony pathology or digital deformities noted.  Skin  normotropic skin with no porokeratosis noted bilaterally.  No signs of infections or ulcers noted.     Onychomycosis  Nails  B/L.  Pain in right toes  Pain in left toes  Debridement of nails both feet followed trimming the nails with dremel tool.  Patient getting shoes but the insoles  are to be trimmed  RTC 4  months.   Helane Gunther DPM

## 2023-06-09 ENCOUNTER — Ambulatory Visit: Payer: Medicare HMO | Admitting: Family Medicine

## 2023-06-13 ENCOUNTER — Ambulatory Visit: Payer: Medicare HMO | Admitting: Physician Assistant

## 2023-06-13 NOTE — Progress Notes (Deleted)
Patient is a pleasant 67 year old female with PMH of right knee wound after debridement and wound matrix placement now s/p STSG performed 03/16/2023 by Dr. Ulice Bold who presents to clinic for postoperative follow-up   She was last seen here in clinic on 05/06/2023.  At that time, exam was largely benign.  STSG harvest site appeared well-healed and the placement site was healthier than prior exams.  Mildly firm to palpation.  Recommended transition to silicone scar sheets at the skin graft harvest site and continued Vaseline as well as mechanical massage at the placement site.  Avoid prolonged soaking in water, but otherwise no restrictions.  Follow-up in 2 months, sooner if needed.  Today,

## 2023-06-15 ENCOUNTER — Ambulatory Visit: Payer: Medicare HMO | Admitting: Podiatry

## 2023-06-15 ENCOUNTER — Ambulatory Visit (INDEPENDENT_AMBULATORY_CARE_PROVIDER_SITE_OTHER): Payer: Medicare HMO

## 2023-06-15 VITALS — Ht 64.0 in | Wt 166.0 lb

## 2023-06-15 DIAGNOSIS — Z0000 Encounter for general adult medical examination without abnormal findings: Secondary | ICD-10-CM

## 2023-06-15 DIAGNOSIS — Z1231 Encounter for screening mammogram for malignant neoplasm of breast: Secondary | ICD-10-CM

## 2023-06-15 NOTE — Patient Instructions (Signed)
[name] , Thank you for taking time to come for your Medicare Wellness Visit. I appreciate your ongoing commitment to your health goals. Please review the following plan we discussed and let me know if I can assist you in the future.   Referrals/Orders/Follow-Ups/Clinician Recommendations: Aim for 30 minutes of exercise or brisk walking, 6-8 glasses of water, and 5 servings of fruits and vegetables each day.  You have an order for:  []   2D Mammogram  [x]   3D Mammogram  []   Bone Density     Please call for appointment:   [facility] [address] [place], [place] [address] [phone]  Make sure to wear two-piece clothing.  No lotions, powders, or deodorants the day of the appointment. Make sure to bring picture ID and insurance card.  Bring list of medications you are currently taking including any supplements.   Schedule your [place] screening mammogram through MyChart!   Log into your MyChart account.  Go to 'Visit' (or 'Appointments' if on mobile App) --> Schedule an Appointment  Under 'Select a Reason for Visit' choose the Mammogram Screening option.  Complete the pre-visit questions and select the time and place that best fits your schedule.     This is a list of the screening recommended for you and due dates:  Health Maintenance  Topic Date Due   Yearly kidney health urinalysis for diabetes  Never done   Complete foot exam   05/21/2022   Eye exam for diabetics  01/14/2023   COVID-19 Vaccine (6 - 2023-24 season) 03/21/2023   Flu Shot  06/02/2023   Hemoglobin A1C  09/03/2023   Yearly kidney function blood test for diabetes  03/14/2024   Medicare Annual Wellness Visit  06/14/2024   Mammogram  06/15/2024   Colon Cancer Screening  01/20/2025   DTaP/Tdap/Td vaccine (3 - Td or Tdap) 07/25/2025   Pneumonia Vaccine  Completed   DEXA scan (bone density measurement)  Completed   Hepatitis C Screening  Completed   Zoster (Shingles)  Vaccine  Completed   HPV Vaccine  Aged Out    Advanced directives: (ACP Link)Information on Advanced Care Planning can be found at [facility] Advance Health Care Directives Advance Health Care Directives ([email or web address]   Next Medicare Annual Wellness Visit scheduled for next year: Yes  Preventive Care 65 Years and Older, Female Preventive care refers to lifestyle choices and visits with your health care provider that can promote health and wellness. What does preventive care include? A yearly physical exam. This is also called an annual well check. Dental exams once or twice a year. Routine eye exams. Ask your health care provider how often you should have your eyes checked. Personal lifestyle choices, including: Daily care of your teeth and gums. Regular physical activity. Eating a healthy diet. Avoiding tobacco and drug use. Limiting alcohol use. Practicing safe sex. Taking low-dose aspirin every day. Taking vitamin and mineral supplements as recommended by your health care provider. What happens during an annual well check? The services and screenings done by your health care provider during your annual well check will depend on your age, overall health, lifestyle risk factors, and family history of disease. Counseling  Your health care provider may ask you questions about your: Alcohol use. Tobacco use. Drug use. Emotional well-being. Home and relationship well-being. Sexual activity. Eating habits. History of falls. Memory and ability to understand (cognition). Work and work Astronomer. Reproductive health. Screening  You may have  the following tests or measurements: Height, weight, and BMI. Blood pressure. Lipid and cholesterol levels. These may be checked every 5 years, or more frequently if you are over 68 years old. Skin check. Lung cancer screening. You may have this screening every year starting at age 66 if you have a 30-pack-year history  of smoking and currently smoke or have quit within the past 15 years. Fecal occult blood test (FOBT) of the stool. You may have this test every year starting at age 90. Flexible sigmoidoscopy or colonoscopy. You may have a sigmoidoscopy every 5 years or a colonoscopy every 10 years starting at age 30. Hepatitis C blood test. Hepatitis B blood test. Sexually transmitted disease (STD) testing. Diabetes screening. This is done by checking your blood sugar (glucose) after you have not eaten for a while (fasting). You may have this done every 1-3 years. Bone density scan. This is done to screen for osteoporosis. You may have this done starting at age 5. Mammogram. This may be done every 1-2 years. Talk to your health care provider about how often you should have regular mammograms. Talk with your health care provider about your test results, treatment options, and if necessary, the need for more tests. Vaccines  Your health care provider may recommend certain vaccines, such as: Influenza vaccine. This is recommended every year. Tetanus, diphtheria, and acellular pertussis (Tdap, Td) vaccine. You may need a Td booster every 10 years. Zoster vaccine. You may need this after age 32. Pneumococcal 13-valent conjugate (PCV13) vaccine. One dose is recommended after age 10. Pneumococcal polysaccharide (PPSV23) vaccine. One dose is recommended after age 40. Talk to your health care provider about which screenings and vaccines you need and how often you need them. This information is not intended to replace advice given to you by your health care provider. Make sure you discuss any questions you have with your health care provider. Document Released: 11/14/2015 Document Revised: 07/07/2016 Document Reviewed: 08/19/2015 Elsevier Interactive Patient Education  2017 [employer].  Fall Prevention in the Home Falls can cause injuries. They can happen to people of all ages. There are many things you can do to  make your home safe and to help prevent falls. What can I do on the outside of my home? Regularly fix the edges of walkways and driveways and fix any cracks. Remove anything that might make you trip as you walk through a door, such as a raised step or threshold. Trim any bushes or trees on the path to your home. Use bright outdoor lighting. Clear any walking paths of anything that might make someone trip, such as rocks or tools. Regularly check to see if handrails are loose or broken. Make sure that both sides of any steps have handrails. Any raised decks and porches should have guardrails on the edges. Have any leaves, snow, or ice cleared regularly. Use sand or salt on walking paths during winter. Clean up any spills in your garage right away. This includes oil or grease spills. What can I do in the bathroom? Use night lights. Install grab bars by the toilet and in the tub and shower. Do not use towel bars as grab bars. Use non-skid mats or decals in the tub or shower. If you need to sit down in the shower, use a plastic, non-slip stool. Keep the floor dry. Clean up any water that spills on the floor as soon as it happens. Remove soap buildup in the tub or shower regularly. Attach bath mats  securely with double-sided non-slip rug tape. Do not have throw rugs and other things on the floor that can make you trip. What can I do in the bedroom? Use night lights. Make sure that you have a light by your bed that is easy to reach. Do not use any sheets or blankets that are too big for your bed. They should not hang down onto the floor. Have a firm chair that has side arms. You can use this for support while you get dressed. Do not have throw rugs and other things on the floor that can make you trip. What can I do in the kitchen? Clean up any spills right away. Avoid walking on wet floors. Keep items that you use a lot in easy-to-reach places. If you need to reach something above you, use a  strong step stool that has a grab bar. Keep electrical cords out of the way. Do not use floor polish or wax that makes floors slippery. If you must use wax, use non-skid floor wax. Do not have throw rugs and other things on the floor that can make you trip. What can I do with my stairs? Do not leave any items on the stairs. Make sure that there are handrails on both sides of the stairs and use them. Fix handrails that are broken or loose. Make sure that handrails are as long as the stairways. Check any carpeting to make sure that it is firmly attached to the stairs. Fix any carpet that is loose or worn. Avoid having throw rugs at the top or bottom of the stairs. If you do have throw rugs, attach them to the floor with carpet tape. Make sure that you have a light switch at the top of the stairs and the bottom of the stairs. If you do not have them, ask someone to add them for you. What else can I do to help prevent falls? Wear shoes that: Do not have high heels. Have rubber bottoms. Are comfortable and fit you well. Are closed at the toe. Do not wear sandals. If you use a stepladder: Make sure that it is fully opened. Do not climb a closed stepladder. Make sure that both sides of the stepladder are locked into place. Ask someone to hold it for you, if possible. Clearly mark and make sure that you can see: Any grab bars or handrails. First and last steps. Where the edge of each step is. Use tools that help you move around (mobility aids) if they are needed. These include: Canes. Walkers. Scooters. Crutches. Turn on the lights when you go into a dark area. Replace any light bulbs as soon as they burn out. Set up your furniture so you have a clear path. Avoid moving your furniture around. If any of your floors are uneven, fix them. If there are any pets around you, be aware of where they are. Review your medicines with your doctor. Some medicines can make you feel dizzy. This can  increase your chance of falling. Ask your doctor what other things that you can do to help prevent falls. This information is not intended to replace advice given to you by your health care provider. Make sure you discuss any questions you have with your health care provider. Document Released: 08/14/2009 Document Revised: 03/25/2016 Document Reviewed: 11/22/2014 Elsevier Interactive Patient Education  2017 [employer].

## 2023-06-15 NOTE — Progress Notes (Signed)
Subjective:   [name] is a 67 y.o. female who presents for Medicare Annual (Subsequent) preventive examination.  Visit Complete: Virtual  I connected with  [name] on 06/15/23 by a audio enabled telemedicine application and verified that I am speaking with the correct person using two identifiers.  Patient Location: Home  Provider Location: Home Office  I discussed the limitations of evaluation and management by telemedicine. The patient expressed understanding and agreed to proceed.  Vital Signs: Unable to obtain new vitals due to this being a telehealth visit.  Review of Systems     Cardiac Risk Factors include: advanced age (>36men, >24 women);diabetes mellitus;dyslipidemia;hypertension     Objective:    Today's Vitals   06/15/23 1650  Weight: 166 lb (75.3 kg)  Height: 5\' 4"  (1.626 m)   Body mass index is 28.49 kg/m.     06/15/2023    4:54 PM 03/16/2023    7:20 AM 03/09/2023    2:30 PM 03/03/2023    4:03 PM 01/24/2023    2:00 PM 12/17/2022    7:44 PM 12/02/2022    8:48 AM  Advanced Directives  Does Patient Have a Medical Advance Directive? No No No No No No No  Would patient like information on creating a medical advance directive? Yes (MAU/Ambulatory/Procedural Areas - Information given) No - Patient declined No - Patient declined No - Patient declined No - Patient declined No - Patient declined No - Patient declined    Current Medications (verified) Outpatient Encounter Medications as of 06/15/2023  Medication Sig   ascorbic acid (VITAMIN C) 250 MG tablet Take 250 mg by mouth daily.   aspirin 81 MG chewable tablet Chew 81 mg by mouth daily.   atorvastatin (LIPITOR) 40 MG tablet Take 1 tablet (40 mg total) by mouth daily at 6 PM. TAKE ONE TABLET DAILY   calcium carbonate (OS-CAL) 600 MG TABS tablet Take 1 tablet (600 mg total) by mouth 2 (two) times daily with a meal. (Patient taking differently: Take 600 mg by mouth daily with breakfast.)   cetirizine  (ZYRTEC) 10 MG tablet Take 1 tablet (10 mg total) by mouth daily. (Patient taking differently: Take 10 mg by mouth daily as needed for allergies.)   docusate sodium (COLACE) 100 MG capsule TAKE ONE CAPSULE BY MOUTH TWICE DAILY   Ferrous Sulfate (IRON SUPPLEMENT) 300 MG/6.8ML SOLN Take 5 mLs by mouth daily.   fluticasone (FLONASE) 50 MCG/ACT nasal spray Place 2 sprays into both nostrils daily.   furosemide (LASIX) 40 MG tablet Take 0.5 tablets (20 mg total) by mouth daily as needed (right leg edema). TAKE ONE TABLET EVERY DAY (Patient taking differently: Take 40 mg by mouth every other day.)   gabapentin (NEURONTIN) 100 MG capsule Take 1 capsule (100 mg total) by mouth 3 (three) times daily as needed.   hydrochlorothiazide (MICROZIDE) 12.5 MG capsule Take 1 capsule (12.5 mg total) by mouth daily.   Multiple Vitamin (MULTIVITAMIN) tablet Take 1 tablet by mouth daily.   olmesartan (BENICAR) 20 MG tablet Take 1 tablet (20 mg total) by mouth at bedtime.   omeprazole (PRILOSEC) 40 MG capsule TAKE ONE CAPSULE EACH DAY BEFORE BREAKFAST -OPEN CAPSULE AND PLACE IN LIQUID IF POSSIBLE   OZEMPIC, 2 MG/DOSE, 8 MG/3ML SOPN Inject 2 mg into the skin once a week. Monday   polyethylene glycol powder (GLYCOLAX/MIRALAX) 17 GM/SCOOP powder Take 17 g by mouth daily. (Patient taking differently: Take 17 g by mouth daily as needed for mild  constipation.)   sucralfate (CARAFATE) 1 g tablet Take 1 tablet (1 g total) by mouth 4 (four) times daily -  with meals and at bedtime.   vitamin B-12 (CYANOCOBALAMIN) 1000 MCG tablet Take 1,000 mcg by mouth daily.   vitamin E 180 MG (400 UNITS) capsule Take 400 Units by mouth daily.   Vitamins A & D 5000-400 units CAPS Take 5,000 Units by mouth daily.   zolpidem (AMBIEN) 5 MG tablet TAKE ONE TABLET AT BEDTIME AS NEEDED FOR SLEEP   No facility-administered encounter medications on file as of 06/15/2023.    Allergies (verified) Aspirin, Pantoprazole sodium, Nsaids, and Tolmetin    History: Past Medical History:  Diagnosis Date   AC (acromioclavicular) joint arthritis 12/23/2017   Left side   Acute renal failure (HCC) 09/24/2016   Allergic rhinitis 05/27/2010   Qualifier: Diagnosis of  By: Mauricio Po MD, James     Allergy    Anemia    Anterior epistaxis 02/27/2021   Arthritis    knees   Asthma, cough variant 03/10/2012   Back pain 10/04/2012   Baker's cyst of knee, right 10/17/2019   Baker's cyst of knee, right 10/17/2019   Biliary sludge 04/08/2014   Taking ursodiol chronically     Blood in stool 10/07/2014   Blood transfusion without reported diagnosis    BMI 40.0-44.9, adult (HCC) 05/22/2021   Bunion 03/28/2014   Cellulitis of leg, left 09/14/2016   Cervical pain (neck) 03/26/2016   Chest pain 05/12/2015   Constipation 10/19/2013   Chronic in the setting of known diverticulosis and history of bleeds.     Contusion of left knee 12/03/2022   Contusion of right knee 12/03/2022   Cramping of hands 02/27/2016   CYST, KIDNEY, ACQUIRED 08/15/2007   Qualifier: Diagnosis of  By: Ludwig Clarks MD, [facility]     DDD (degenerative disc disease), lumbar 04/14/2016   Managed by [facility] [id number]   DE QUERVAIN'S TENOSYNOVITIS 11/30/2010   Qualifier: Diagnosis of  By: Jeanice Lim MD, Kingsley Spittle     Degenerative arthritis of right shoulder region 08/2013   Diabetes mellitus (HCC) 05/05/2020   A1c 2010 = 6.5%.  No A1c greater than 6.5% since.    Diverticulosis    Diverticulosis of colon with hemorrhage    Hx  Of recurrent Diverticular bleeding - several prior admissions    Diverticulosis of colon with hemorrhage,  s/p colectomy ileorectal anastamosis    Hx  Of recurrent Diverticular bleeding - 4 to 5  prior admissions for GI bleeding 2016 and before. Seen inhouse by [facility] in 2016   Diverticulosis of colon without hemorrhage 11/18/2014   Noted on colonoscopy 11/2014    Dry eye syndrome of bilateral lacrimal glands 12/23/2021   Dry eye syndrome of bilateral  lacrimal glands 12/23/2021   Epistaxis 02/27/2021   Esophageal dysmotility 02/14/2017   Esophageal Manometry 02/11/17   External hemorrhoids without complication 12/28/2012   Family history of colon cancer in father diagnosed in 53s 06/21/2019   Gastrojejunal ulcer with hemorrhage    Gastrojejunal ulcer with hemorrhage    GERD (gastroesophageal reflux disease)    H/O total knee replacement, bilateral 12/18/2022   High cholesterol    History of GI bleed    History of iron deficiency anemia 12/29/2006       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 Seen inhouse by [facility] in 2016   Hx of gastrointestinal hemorrhage 05/18/2019   Extending back 2001, 2007 bleeds. 05/2013 colonoscopy.  Dr. Arlyce Dice.  For hematochezia, first-degree relative with colon cancer, personal history adenomatous colon polyps.  Sessile, sigmoid polyp removed; path consistent with inflammatory polyp..  Dense pandiverticulosis.  No fresh or old blood encountered.  Presumed to have had diverticular bleed. 11/2014 colonoscopy For hematochezia.  Pandiverticul   Hyperlipidemia associated with type 2 diabetes mellitus (HCC) 02/13/2014   Hypertension    under control with meds., has been on med. > 20 yr.   Hypertension associated with diabetes (HCC) 01/05/2012   Hypokalemia 03/18/2015   3.3 on 03/17/15   Hypokalemia due to loop diuretic therapy 03/18/2015   3.3 on 03/17/15   Hypopigmented skin lesion 02/23/2011   Ileus following gastrointestinal surgery (HCC) 12/12/2015   Insomnia 08/29/2012   Intractable vomiting 09/24/2016   Iridocyclitis, noninfectious, left eye 08/26/2020   Diagnosis: optometrist angela Vanessa Ralphs OD at [facility]. Referred back to Dr Vonna Kotyk (MD)   Leg cramping 04/22/2014   Lesion of right lower eyelid 02/27/2021   Lipoma of lower extremity 03/07/2013   Overview:  RIGHT THIGH   Low back pain 03/26/2016   Lower GI bleed    Lumbar back pain 03/26/2016   Medial meniscus tear 1997   Right Knee   Mini stroke    Morbid obesity (HCC) 12/29/2006   Multiple open wounds of lower leg 11/12/2016   Muscle pain 05/26/2015   Nerve palsy, acute, left upper extremity 01/29/2022   Associated with acute, traumatic left shoulder dislocation and interventional reduction   Nocturnal leg cramps 06/11/2022   Onychomycosis of multiple toenails with type 2 diabetes mellitus and peripheral angiopathy (HCC) 12/18/2021   Orthostatic dizziness 08/26/2010   Qualifier: Diagnosis of  By: Earlene Plater DO, Alcario Drought     Osteoarthritis of both knees 03/12/2016   S/P bilateral knee replacment 2000   Osteoarthritis, bilateral hands/fingers 04/02/2021   Peripheral neuropathy 05/20/2017   Prediabetes 05/05/2020   Premature supraventricular beats 12/25/2015   EKG 12/25/15    Rash and nonspecific skin eruption 04/01/2017   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) 05/18/2019   CT knee 04/2019 :RIGHT knee prosthesis with moderate knee joint effusion   Rotator cuff rupture, complete 08/2013   right   Shoulder dislocation, Left, acute 01/29/2022   ED reduction of acute traumatic left shoulder dislocation with nerve palsy   Shoulder dislocation, Left, acute 01/29/2022   ED reduction of acute traumatic left shoulder dislocation with nerve palsy   Shoulder impingement 08/2013   right   Skin lesion of right lower extremity 05/27/2016   Small bowel obstruction (HCC) 12/12/2015   Snoring 08/24/2018   Stroke (HCC) 2006   Remote left lacunar infarct noted on CT head 2006, 2010    Subacromial bursitis 05/21/2013   Superficial thrombophlebitis 01/29/2016   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   See assessment/plan  from 03/22/2019 office visit with colposcopic evaluation by Dr Araceli [facility]) in [facility]  suspected   Tingling 11/17/2017   Type 2 diabetes mellitus with other specified complication, without long-term current use of insulin (HCC)    Ulcer    Ulcer at site of surgical anastomosis following bypass of stomach 05/18/2019   Vaginal  bleeding 05/17/2019   See 03/12/2019 Telephone call note and Office visit 03/23/2019 with Denny Levy MD in [facility]   Venous insufficiency of both lower extremities, Right > Left leg 10/12/2019   Venous stasis ulcer of left ankle with fat layer exposed with varicose veins (HCC) 10/20/2016   Wears dentures    upper   Wears partial dentures    lower   Wound infection 12/18/2022   Past Surgical History:  Procedure Laterality Date   ABDOMINAL HYSTERECTOMY  ?1987   partial   APPLICATION OF WOUND VAC Right 12/22/2022   Procedure: APPLICATION OF WOUND VAC;  Surgeon: Peggye Form, DO;  Location: [facility] OR;  Service: Plastics;  Laterality: Right;   APPLICATION OF WOUND VAC Right 03/16/2023   Procedure: APPLICATION OF WOUND VAC;  Surgeon: Peggye Form, DO;  Location: [facility];  Service: Plastics;  Laterality: Right;   COLONOSCOPY  05/02/2000; 05/08/2013, 11/08/14   COSMETIC SURGERY  02/22/2014   skin removal surgery from lower abdomen    ENDOVENOUS ABLATION SAPHENOUS VEIN W/ LASER Right 08/12/2016   endovenous laser ablation right greater saphenous vein by Gretta Began MD    ENDOVENOUS ABLATION SAPHENOUS VEIN W/ LASER Left 10/21/2016   EVLA L GSV by Gretta Began MD   ESOPHAGOGASTRODUODENOSCOPY N/A 05/17/2019   Procedure: ESOPHAGOGASTRODUODENOSCOPY (EGD);  Surgeon: Iva Boop, MD;  Location: [facility];  Service: Endoscopy;  Laterality: N/A;   HEMATOMA EVACUATION Right 12/22/2022   Procedure: EXCISION OF right knee WOUND AND  hematoma evacuation;  Surgeon: Peggye Form, DO;  Location: [facility] OR;  Service: Plastics;  Laterality: Right;    HEMOSTASIS CLIP PLACEMENT  05/17/2019   Procedure: HEMOSTASIS CLIP PLACEMENT;  Surgeon: Iva Boop, MD;  Location: [facility] ENDOSCOPY;  Service: Endoscopy;;   METATARSAL OSTEOTOMY Right 1995   Right Bunion Repair   ROTATOR CUFF REPAIR Left 04/27/2018   ROUX-EN-Y GASTRIC BYPASS  05/07/2012   SCLEROTHERAPY  05/17/2019   Procedure: Susa Day;  Surgeon: Iva Boop, MD;  Location: [facility] END[facility];  Service: Endoscopy;;   SHOULDER ARTHROSCOPY WITH ROTATOR CUFF REPAIR AND SUBACROMIAL DECOMPRESSION Right 08/09/2013   Procedure: RIGHT SHOULDER ARTHROSCOPY WITH SUBACROMIAL DECOMPRESSION, DISTAL CLAVICLE EXCISION AND ROTATOR CUFF REPAIR and release biceps;  Surgeon: Loreta Ave, MD;  Location: [facility];  Service: Orthopedics;  Laterality: Right;   SKIN SPLIT GRAFT Right 03/16/2023   Procedure: Split-thickness skin graft to right knee wound;  Surgeon: Peggye Form, DO;  Location: [facility];  Service: Plastics;  Laterality: Right;   SUBTOTAL COLECTOMY  11/24/2015   [facility]: laparoscopic-assisted subtotal colectomy with ileorectal anastomosis.   TOTAL KNEE ARTHROPLASTY Left    TOTAL KNEE ARTHROPLASTY Right    Family History  Problem Relation Age of Onset   Macular degeneration Mother    Diabetes Mother    Hypertension Mother    Cancer Father 59       Died from complications of colon CA   Colon cancer Father        died in his 21s   Macular degeneration Father    Glaucoma Father    Glaucoma Sister    Macular degeneration Sister    Diabetes Sister    Heart disease Sister    Diabetes Sister    Stroke Sister    Glaucoma Brother    Macular degeneration Brother    Colon polyps Brother    Diabetes Brother    Stroke Brother    Colon  polyps Brother    Diabetes Brother    Diabetes Brother    Diabetes Brother    Hypertension Son    Esophageal cancer Neg Hx    Rectal cancer Neg Hx    Stomach cancer Neg Hx    Social History   Socioeconomic  History   Marital status: Divorced    Spouse name: Not on file   Number of children: 1   Years of education: 12   Highest education level: 12th grade  Occupational History   Occupation: child care   Tobacco Use   Smoking status: Former    Current packs/day: 0.00    Average packs/day: 0.1 packs/day for 6.0 years (0.6 ttl pk-yrs)    Types: Cigarettes    Start date: 97    Quit date: 1976    Years since quitting: 48.6   Smokeless tobacco: Never  Vaping Use   Vaping status: Never Used  Substance and Sexual Activity   Alcohol use: Yes    Alcohol/week: 0.0 standard drinks of alcohol    Comment: occasional wine   Drug use: No   Sexual activity: Not Currently  Other Topics Concern   Not on file  Social History Narrative   ** Merged History Encounter **       She is divorced and has 1 child She works in a daycare facility Occasional wine former smoker no drug use   Social Determinants of Corporate investment banker Strain: Low Risk  (06/15/2023)   Overall Financial Resource Strain (CARDIA)    Difficulty of Paying Living Expenses: Not hard at all  Food Insecurity: No Food Insecurity (06/15/2023)   Hunger Vital Sign    Worried About Running Out of Food in the Last Year: Never true    Ran Out of Food in the Last Year: Never true  Transportation Needs: No Transportation Needs (06/15/2023)   PRAPARE - Administrator, Civil Service (Medical): No    Lack of Transportation (Non-Medical): No  Physical Activity: Insufficiently Active (06/15/2023)   Exercise Vital Sign    Days of Exercise per Week: 3 days    Minutes of Exercise per Session: 30 min  Stress: No Stress Concern Present (06/15/2023)   [employer] of Occupational Health - Occupational Stress Questionnaire    Feeling of Stress : Not at all  Social Connections: Moderately Integrated (06/15/2023)   Social Connection and Isolation Panel [NHANES]    Frequency of Communication with Friends and Family: More than  three times a week    Frequency of Social Gatherings with Friends and Family: Three times a week    Attends Religious Services: More than 4 times per year    Active Member of Clubs or Organizations: Yes    Attends Engineer, structural: More than 4 times per year    Marital Status: Divorced    Tobacco Counseling Counseling given: Not Answered   Clinical Intake:  Pre-visit preparation completed: Yes  Pain : No/denies pain     Diabetes: Yes CBG done?: No Did pt. bring in CBG monitor from home?: No  How often do you need to have someone help you when you read instructions, pamphlets, or other written materials from your doctor or pharmacy?: 1 - Never  Interpreter Needed?: No  Information entered by :: Kandis Fantasia LPN   Activities of Daily Living    06/15/2023    4:54 PM 03/16/2023    7:24 AM  In your present state of health, do  you have any difficulty performing the following activities:  Hearing? 0 0  Vision? 0 0  Difficulty concentrating or making decisions? 0 0  Walking or climbing stairs? 0 0  Dressing or bathing? 0 0  Doing errands, shopping? 0   Preparing Food and eating ? N   Using the Toilet? N   In the past six months, have you accidently leaked urine? N   Do you have problems with loss of bowel control? N   Managing your Medications? N   Managing your Finances? N   Housekeeping or managing your Housekeeping? N     Patient Care Team: McDiarmid, Leighton Roach, MD as PCP - General (Family Medicine) Orpah Cobb, MD as Consulting Physician (Cardiology) Linna Darner, RD as Dietitian (Family Medicine) Glenford Peers, OD as Referring Physician (Optometry) Gelene Mink, OD as Consulting Physician (Optometry) Evlyn Kanner, MD (Inactive) as Consulting Physician (Surgery) Thereasa Solo, NP as Nurse Practitioner (Nutrition) Mia Creek, MD as Consulting Physician (Ophthalmology) Rodrigo Ran, OD (Optometry)  Indicate any recent Medical  Services you may have received from other than [facility] providers in the past year (date may be approximate).     Assessment:   This is a routine wellness examination for [name].  Hearing/Vision screen Hearing Screening - Comments:: Denies hearing difficulties   Vision Screening - Comments:: Wears rx glasses - up to date with routine eye exams with [facility]   Dietary issues and exercise activities discussed:     Goals Addressed   None   Depression Screen    06/15/2023    4:52 PM 03/03/2023    4:03 PM 01/24/2023    2:00 PM 12/02/2022    8:49 AM 09/30/2022   11:03 AM 08/26/2022   11:11 AM 06/10/2022   10:35 AM  PHQ 2/9 Scores  PHQ - 2 Score 0 0 0 0 0 0 0  PHQ- 9 Score  1 0 1 0 1 3    Fall Risk    06/15/2023    4:53 PM 01/24/2023    2:00 PM 12/02/2022    8:49 AM 09/30/2022   11:03 AM 08/26/2022   11:12 AM  Fall Risk   Falls in the past year? 0 1 0 1 0  Number falls in past yr: 0 0 0 0 0  Injury with Fall? 0 1 0 1 0  Risk for fall due to : No Fall Risks      Follow up Falls prevention discussed;Education provided;Falls evaluation completed        MEDICARE RISK AT HOME:  Medicare Risk at Home - 06/15/23 1654     Any stairs in or around the home? No    If so, are there any without handrails? No    Home free of loose throw rugs in walkways, pet beds, electrical cords, etc? Yes    Adequate lighting in your home to reduce risk of falls? Yes    Life alert? No    Use of a cane, walker or w/c? No    Grab bars in the bathroom? Yes    Shower chair or bench in shower? No    Elevated toilet seat or a handicapped toilet? No             TIMED UP AND GO:  Was the test performed?  No    Cognitive Function:        06/15/2023    4:54 PM 07/10/2021   12:38 PM  6CIT Screen  What Year?  0 points 0 points  What month? 0 points 0 points  What time? 0 points 0 points  Count back from 20 0 points 0 points  Months in reverse 0 points 0 points  Repeat phrase 0 points 0  points  Total Score 0 points 0 points    Immunizations Immunization History  Administered Date(s) Administered   COVID-19, mRNA, vaccine(Comirnaty)12 years and older 01/24/2023   Fluad Quad(high Dose 65+) 07/09/2021, 08/26/2022   Hepatitis B 09/07/2006, 11/18/2006, 03/22/2007   Influenza Split 07/16/2011, 07/18/2012   Influenza Whole 08/15/2007, 08/06/2008, 08/18/2009, 08/06/2010   Influenza,inj,Quad PF,6+ Mos 10/19/2013, 07/25/2014, 01/25/2015, 07/17/2015, 08/05/2016, 09/25/2016, 07/01/2017, 11/06/2018, 07/05/2019, 09/02/2020   PFIZER Comirnaty(Gray Top)Covid-19 Tri-Sucrose Vaccine 04/02/2021   PFIZER(Purple Top)SARS-COV-2 Vaccination 12/30/2019, 01/29/2020, 08/16/2020   PNEUMOCOCCAL CONJUGATE-20 07/09/2021   PPD Test 07/18/2012, 04/23/2015, 06/17/2017   Pneumococcal Polysaccharide-23 09/07/2006, 12/27/2012, 01/25/2015, 12/19/2015   Td 01/31/2004   Tdap 07/26/2015   Zoster Recombinant(Shingrix) 12/15/2016, 04/03/2022   Zoster, Live 12/15/2016    TDAP status: Up to date  Flu Vaccine status: Due, Education has been provided regarding the importance of this vaccine. Advised may receive this vaccine at local pharmacy or Health Dept. Aware to provide a copy of the vaccination record if obtained from local pharmacy or Health Dept. Verbalized acceptance and understanding.  Pneumococcal vaccine status: Up to date  Covid-19 vaccine status: Information provided on how to obtain vaccines.   Qualifies for Shingles Vaccine? Yes   Zostavax completed Yes   Shingrix Completed?: Yes  Screening Tests Health Maintenance  Topic Date Due   Diabetic kidney evaluation - Urine ACR  Never done   FOOT EXAM  05/21/2022   OPHTHALMOLOGY EXAM  01/14/2023   COVID-19 Vaccine (6 - 2023-24 season) 03/21/2023   INFLUENZA VACCINE  06/02/2023   HEMOGLOBIN A1C  09/03/2023   Diabetic kidney evaluation - eGFR measurement  03/14/2024   Medicare Annual Wellness (AWV)  06/14/2024   MAMMOGRAM  06/15/2024    Colonoscopy  01/20/2025   DTaP/Tdap/Td (3 - Td or Tdap) 07/25/2025   Pneumonia Vaccine 86+ Years old  Completed   DEXA SCAN  Completed   Hepatitis C Screening  Completed   Zoster Vaccines- Shingrix  Completed   HPV VACCINES  Aged Out    Health Maintenance  Health Maintenance Due  Topic Date Due   Diabetic kidney evaluation - Urine ACR  Never done   FOOT EXAM  05/21/2022   OPHTHALMOLOGY EXAM  01/14/2023   COVID-19 Vaccine (6 - 2023-24 season) 03/21/2023   INFLUENZA VACCINE  06/02/2023    Colorectal cancer screening: Type of screening: Colonoscopy. Completed 01/21/20. Repeat every 5 years  Mammogram status: Ordered today. Pt provided with contact info and advised to call to schedule appt.   Bone Density status: Completed 06/03/22. Results reflect: Bone density results: NORMAL. Repeat every 5 years.  Lung Cancer Screening: (Low Dose CT Chest recommended if Age 58-80 years, 20 pack-year currently smoking OR have quit w/in 15years.) does not qualify.   Lung Cancer Screening Referral: na/  Additional Screening:  Hepatitis C Screening: does qualify; Completed 02/27/16  Vision Screening: Recommended annual ophthalmology exams for early detection of glaucoma and other disorders of the eye. Is the patient up to date with their annual eye exam?  Yes  Who is the provider or what is the name of the office in which the patient attends annual eye exams? [facility] If pt is not established with a provider, would they like to be referred to a  provider to establish care? No .   Dental Screening: Recommended annual dental exams for proper oral hygiene  Diabetic Foot Exam: Diabetic Foot Exam: Overdue, Pt has been advised about the importance in completing this exam. Pt is scheduled for diabetic foot exam on at next office visit.  Community Resource Referral / Chronic Care Management: CRR required this visit?  No   CCM required this visit?  No     Plan:     I have personally  reviewed and noted the following in the patient's chart:   Medical and social history Use of alcohol, tobacco or illicit drugs  Current medications and supplements including opioid prescriptions. Patient is not currently taking opioid prescriptions. Functional ability and status Nutritional status Physical activity Advanced directives List of other physicians Hospitalizations, surgeries, and ER visits in previous 12 months Vitals Screenings to include cognitive, depression, and falls Referrals and appointments  In addition, I have reviewed and discussed with patient certain preventive protocols, quality metrics, and best practice recommendations. A written personalized care plan for preventive services as well as general preventive health recommendations were provided to patient.     Kandis Fantasia [place], [place]   4/78/2956   After Visit Summary: (Mail) Due to this being a telephonic visit, the after visit summary with patients personalized plan was offered to patient via mail   Nurse Notes: Patient would like to discuss cost of Ozempic at upcoming appointment

## 2023-06-16 ENCOUNTER — Telehealth: Payer: Self-pay | Admitting: Pharmacist

## 2023-06-16 ENCOUNTER — Telehealth: Payer: Self-pay | Admitting: Student-PharmD

## 2023-06-16 NOTE — Telephone Encounter (Signed)
Patient's chart was flagged for being at risk of failing medication adherence report for Ozempic (semaglutide). Per note on 05/12/23, patient had decreased Ozempic from weekly to every-other-week due to intolerability. This was not reflected in the prescription.  Patient states today she increased dosing frequency back up to once weekly and has been tolerating it.  Patient tried to pick up next fill of Ozempic yesterday (06/15/23), and was told it would be approximately $200 to fill (up from approximately $45/fill with prior fills). She is unable to afford this now, so she did not pick up the medication.  Patient reports having one box of Ozempic left, which should be sufficient to get her to her follow-up appointment with Dr. Perley Jain on 06/30/23.   Patient will call if she realizes she does not have enough supply to make it to next appointment.   Forwarding to [place], CPhT, to assist with medication access.

## 2023-06-16 NOTE — Telephone Encounter (Signed)
Opened in error

## 2023-06-17 ENCOUNTER — Other Ambulatory Visit (HOSPITAL_COMMUNITY): Payer: Self-pay

## 2023-06-20 ENCOUNTER — Encounter: Payer: Self-pay | Admitting: Pharmacist

## 2023-06-20 NOTE — Telephone Encounter (Signed)
Submitted application for OZEMPIC to [employer] for patient assistance via online portal.   Phone: [phone]  

## 2023-06-20 NOTE — Progress Notes (Signed)
Adherence check-in. Reduced dosing of Ozempic to every other week - has PCP appt. 06/09/23 when new prescription for correct dosing should occur. Note to PCP already sent on 05/12/23.

## 2023-06-22 ENCOUNTER — Encounter: Payer: Medicare HMO | Admitting: Student

## 2023-06-22 NOTE — Progress Notes (Deleted)
Patient is a 67 year old female with history of a right knee wound after debridement and wound matrix placement status post STSG performed on 03/16/2023 by Dr. Ulice Bold.  She presents to the clinic for postoperative follow-up.  Patient was last seen in the clinic on 05/06/2023.  At this visit, patient reported she is doing well.  Her skin graft harvest site had completely healed and her STSG placement site was getting better.  Patient reported that she felt a little bit of tightness when she performed flexion of the right knee.  On exam, STSG placement site appeared much healthier than it did to the previous visit.  There is improved color and better incorporation.  The STSG harvest site was well-healed.  Plan was for patient to transition to silicone scar sheets at the skin graft harvest site.  Recommended Vaseline and massage to the STSG placement site daily.

## 2023-06-24 NOTE — Telephone Encounter (Signed)
Received notification from [employer] regarding approval for OZEMPIC. Patient assistance approved from 06/23/23 to 11/01/23.  Medication should arrive to office in 10-14 business days.  Phone: [phone]

## 2023-06-27 ENCOUNTER — Other Ambulatory Visit: Payer: Self-pay | Admitting: Family Medicine

## 2023-06-27 DIAGNOSIS — I10 Essential (primary) hypertension: Secondary | ICD-10-CM

## 2023-06-27 DIAGNOSIS — I152 Hypertension secondary to endocrine disorders: Secondary | ICD-10-CM

## 2023-06-27 DIAGNOSIS — E1159 Type 2 diabetes mellitus with other circulatory complications: Secondary | ICD-10-CM

## 2023-06-30 ENCOUNTER — Encounter: Payer: Self-pay | Admitting: Family Medicine

## 2023-06-30 ENCOUNTER — Ambulatory Visit: Payer: Medicare HMO | Admitting: Family Medicine

## 2023-06-30 VITALS — BP 110/70 | HR 88 | Ht 64.0 in | Wt 163.2 lb

## 2023-06-30 DIAGNOSIS — I872 Venous insufficiency (chronic) (peripheral): Secondary | ICD-10-CM

## 2023-06-30 DIAGNOSIS — R600 Localized edema: Secondary | ICD-10-CM

## 2023-06-30 DIAGNOSIS — R609 Edema, unspecified: Secondary | ICD-10-CM | POA: Diagnosis not present

## 2023-06-30 DIAGNOSIS — E1159 Type 2 diabetes mellitus with other circulatory complications: Secondary | ICD-10-CM

## 2023-06-30 DIAGNOSIS — M79601 Pain in right arm: Secondary | ICD-10-CM

## 2023-06-30 DIAGNOSIS — I152 Hypertension secondary to endocrine disorders: Secondary | ICD-10-CM | POA: Diagnosis not present

## 2023-06-30 DIAGNOSIS — E119 Type 2 diabetes mellitus without complications: Secondary | ICD-10-CM | POA: Diagnosis not present

## 2023-06-30 DIAGNOSIS — M7551 Bursitis of right shoulder: Secondary | ICD-10-CM | POA: Diagnosis not present

## 2023-06-30 DIAGNOSIS — R0602 Shortness of breath: Secondary | ICD-10-CM | POA: Diagnosis not present

## 2023-06-30 LAB — POCT GLYCOSYLATED HEMOGLOBIN (HGB A1C): Hemoglobin A1C: 5.6 % (ref 4.0–5.6)

## 2023-06-30 MED ORDER — METHYLPREDNISOLONE ACETATE 40 MG/ML IJ SUSP
40.0000 mg | Freq: Once | INTRAMUSCULAR | Status: AC
Start: 2023-06-30 — End: 2023-06-30
  Administered 2023-06-30: 40 mg via INTRAMUSCULAR

## 2023-06-30 NOTE — Patient Instructions (Signed)
If you shoulder pain does not improve in a few days then you may want to talk with Dr Eulah Pont about the pain.   Keep changing the bandage to your left leg sore every week until it heals closed.   Dr Patsye Sullivant will talk with our pharmacy folks to see if we can help you with the Ozempic.

## 2023-06-30 NOTE — Progress Notes (Signed)
    SUBJECTIVE:   CHIEF COMPLAINT / HPI:   [name] is a 67 y/o female presenting for R arm pain, L ankle wound concern, BP check, TII DM management and bilateral LE swelling.  TII DM: Patient currently prescribed Ozempic 2mg  weekly. She endorses weight loss and denies nausea, vomiting, diarrhea or constipation. She reports her previous copay was $45 whereas her current copay is $200.  R Arm Pain: Patient endorses about one month of intermittent R arm pain worse at night. She describes the pain as sharp and tingling. She reports she has not experiences this before. She denies any trauma. She endorses decreased arm strength and more pronounced veins. She reports about 4-5 years ago undergoing R shoulder surgery.   L Ankle Wound: Patient reports about one week ago hitting her ankle on her bed rail. She denies difficulty walking or pain with ambulating. She reports she previously had a wound above this wound that did not heal well resulting in having to go to a wound clinic.  HTN: Patient currently taking Benicar 20 mg daily, hydrochlorothiazide 12.5 mg daily and lasix 40 mg daily.   Bilateral LE Edema: Patient endorses history of leg swelling with recent increased bilateral leg swelling.  PERTINENT  PMH / PSH: HTN, DM, Obesity, Venous Insufficiency  OBJECTIVE:   BP 110/70   Pulse 88   Ht 5\' 4"  (1.626 m)   Wt 163 lb 3.2 oz (74 kg)   SpO2 100%   BMI 28.01 kg/m    General: NAD, pleasant, cooperative Cardiac: RRR, no murmurs Respiratory: Clear to auscultation bilaterally, normal work of breathing Extremities: 2+ dp equal bilaterally. Bilateral LE edema MSK: No tenderness to palpation on R arm. Normal strength and range of motion. TTP at the R shoulder joint Skin: 0.7-0.8 cm open wound on L lateral extremity above lateral malleolus. No erythema or warmth appreciated around wound. TTP below wound. No drainage.  ASSESSMENT/PLAN:   Hypertension associated with diabetes (HCC) Chronic, well  controlled. Patient is at goal of <140/90. Continue current medication regimen.   Controlled type 2 diabetes mellitus without complication, without long-term current use of insulin (HCC) Chronic, well controlled. At goal of A1c <7.0. Patient reports she still has 2 more weeks of Ozempic. Continue Ozempic 2 mg [place] weekly. Information to be sent to internal pharmacy team to see if they can assist.   Venous insufficiency of both lower extremities, Right > Left leg Chronic, worsening. Suspect continued venous insufficiency; however, cannot rule out cardiac etiology. CBC, CMP and BNP ordered.    R Arm Pain Acute, stable. Patient has hx of R rotator cuff repair. Patient reports receiving injections in R shoulder for previous pain. Suspect R arm pain originating from shoulder. Additional ddx include neuropathy given tingling pain especially at night. Patient is currently prescribed Gabapentin 100 mg TID PRN and reports inconsistent relief with Gabapentin. Steroid injection to be given today. If pain worsens or does not improve, patient advised to talk with Dr. Eulah Pont.  L Ankle Wound Acute, stable. No local infection appreciated on physical exam; no systemic symptoms. Duoderm applied to wound; patient advised to change Duoderm every week until wound heals.   Health Maintenance - Patient passed Mini-Cog provided in office today  FOLLOW-UP: Patient is to return to the clinic in 3 months for follow up.  Bubba Hales, Medical Student [facility]

## 2023-06-30 NOTE — Assessment & Plan Note (Addendum)
Chronic, well controlled. At goal of A1c <7.0. Patient reports she still has 2 more weeks of Ozempic. Continue Ozempic 2 mg [place] weekly. Information to be sent to internal pharmacy team to see if they can assist.

## 2023-06-30 NOTE — Assessment & Plan Note (Addendum)
Chronic, well controlled. Patient is at goal of <140/90. Continue current medication regimen.

## 2023-06-30 NOTE — Assessment & Plan Note (Addendum)
Chronic, worsening. Suspect continued venous insufficiency; however, cannot rule out cardiac etiology. CBC, CMP and BNP ordered.  Unremarkable CBC, CMEt, BNP Albumin 4.2  A/ Bilateral edema consistent with chronic venous insufficiency of bilateral lower extremities.   Plan May continue Lasix as patient feels it helps and no evidence of hypokalemia or sign rise in serum creatinine Prescription low pressure graduated compression stockings mailed to patient to fill and medical supply shop.

## 2023-07-02 LAB — CBC
Hematocrit: 32.2 % — ABNORMAL LOW (ref 34.0–46.6)
Hemoglobin: 10.6 g/dL — ABNORMAL LOW (ref 11.1–15.9)
MCH: 30.9 pg (ref 26.6–33.0)
MCHC: 32.9 g/dL (ref 31.5–35.7)
MCV: 94 fL (ref 79–97)
Platelets: 214 10*3/uL (ref 150–450)
RBC: 3.43 x10E6/uL — ABNORMAL LOW (ref 3.77–5.28)
RDW: 13.6 % (ref 11.7–15.4)
WBC: 4.5 10*3/uL (ref 3.4–10.8)

## 2023-07-02 LAB — COMPREHENSIVE METABOLIC PANEL
ALT: 52 IU/L — ABNORMAL HIGH (ref 0–32)
AST: 32 IU/L (ref 0–40)
Albumin: 4.2 g/dL (ref 3.9–4.9)
Alkaline Phosphatase: 78 IU/L (ref 44–121)
BUN/Creatinine Ratio: 19 (ref 12–28)
BUN: 18 mg/dL (ref 8–27)
Bilirubin Total: 0.3 mg/dL (ref 0.0–1.2)
CO2: 23 mmol/L (ref 20–29)
Calcium: 9.4 mg/dL (ref 8.7–10.3)
Chloride: 108 mmol/L — ABNORMAL HIGH (ref 96–106)
Creatinine, Ser: 0.94 mg/dL (ref 0.57–1.00)
Globulin, Total: 2 g/dL (ref 1.5–4.5)
Glucose: 97 mg/dL (ref 70–99)
Potassium: 3.5 mmol/L (ref 3.5–5.2)
Sodium: 145 mmol/L — ABNORMAL HIGH (ref 134–144)
Total Protein: 6.2 g/dL (ref 6.0–8.5)
eGFR: 67 mL/min/{1.73_m2} (ref >59)

## 2023-07-02 LAB — BRAIN NATRIURETIC PEPTIDE: BNP: 17 pg/mL (ref 0.0–100.0)

## 2023-07-05 ENCOUNTER — Encounter: Payer: Self-pay | Admitting: Family Medicine

## 2023-07-05 NOTE — Progress Notes (Signed)
See attestation

## 2023-07-12 ENCOUNTER — Telehealth: Payer: Self-pay

## 2023-07-12 NOTE — Telephone Encounter (Signed)
Informed patient her novo nordisk shipment is ready for pickup.  4 boxes of Ozempic 2mg  dose pens

## 2023-07-13 ENCOUNTER — Telehealth: Payer: Self-pay | Admitting: Family Medicine

## 2023-07-13 DIAGNOSIS — S81802D Unspecified open wound, left lower leg, subsequent encounter: Secondary | ICD-10-CM

## 2023-07-13 MED ORDER — OXYCODONE-ACETAMINOPHEN 5-325 MG PO TABS
1.0000 | ORAL_TABLET | Freq: Three times a day (TID) | ORAL | 0 refills | Status: DC | PRN
Start: 2023-07-13 — End: 2023-07-28

## 2023-07-13 NOTE — Telephone Encounter (Signed)
Shipment given to patient.

## 2023-07-13 NOTE — Telephone Encounter (Signed)
[name] reporting pain at site of left lower leg wound just proximal to the lat malleolus which was evaluated at [facility] by me on 06/30/23.    Denies eythema, increased warmth. She reports wound appears to be healing.   Pain interfereing with her sleep.   Uncertain origin of pain at wound.   Patient with paplable DP and post tib pulses on left last week.   Analgesic with oxycodone-APAP 5-325 # 30 tab. RF 0.  Patient advised that should the pain increase or findings of infection (reviewed with patient) begin, she is to be seen at the [facility].

## 2023-07-13 NOTE — Telephone Encounter (Signed)
Patient is requesting doctor call her regarding leg pain. Want pain medicine called in.

## 2023-07-13 NOTE — Telephone Encounter (Signed)
Routed message to PCP. [name], CMA  

## 2023-07-18 ENCOUNTER — Other Ambulatory Visit: Payer: Self-pay | Admitting: Family Medicine

## 2023-07-22 NOTE — Progress Notes (Signed)
This encounter was created in error - please disregard.

## 2023-07-27 NOTE — Telephone Encounter (Signed)
Patient returns call to nurse line regarding wound.   She reports continued drainage. She states that this drainage is clear. Reports that pain has not worsened, however, has not improved. She will take pain medicine as needed, however, is not able to take throughout the day due to working.   She denies redness or warmth, fever, chills, purulent drainage. Ran out of colloid dressings provided by Dr. Perley Jain. She went to [facility] and bought more of these dressings yesterday.   Recommended scheduling follow up visit to have provider look at wound. Patient reports that it is very difficult for her to come into the office and would like me to forward this message to Dr. McDiarmid.   Advised of return precautions. Will forward to PCP.   Veronda Prude, RN

## 2023-07-28 MED ORDER — OXYCODONE-ACETAMINOPHEN 5-325 MG PO TABS
1.0000 | ORAL_TABLET | Freq: Four times a day (QID) | ORAL | 0 refills | Status: DC | PRN
Start: 2023-07-28 — End: 2023-08-26

## 2023-07-28 NOTE — Telephone Encounter (Signed)
Spoke with patient informed of note left by PCP. Patient understood. Patient also mentioned that she has a black spot developing on her Right ankle where a sore was. I informed her that Dr. McDiarmid didn't have anything open until oct 24th. She said she will wait until then. She was advised to go to the ER if it gets worse or develop fever. Or she can also call back to the office to see another provider. Aquilla Solian, CMA

## 2023-07-28 NOTE — Telephone Encounter (Signed)
Please let Ms Beltz know I have called in 20 more tablets of her pain medication, but should she need more pain medication, she will need to be seen in the office by someone.

## 2023-08-01 ENCOUNTER — Other Ambulatory Visit: Payer: Self-pay | Admitting: Family Medicine

## 2023-08-01 DIAGNOSIS — I872 Venous insufficiency (chronic) (peripheral): Secondary | ICD-10-CM

## 2023-08-04 NOTE — Telephone Encounter (Signed)
Called patient. She did not answer, LVM asking that she return call to office to provide dosage clarification.   Veronda Prude, RN

## 2023-08-25 ENCOUNTER — Ambulatory Visit (INDEPENDENT_AMBULATORY_CARE_PROVIDER_SITE_OTHER): Payer: Medicare HMO | Admitting: Family Medicine

## 2023-08-25 ENCOUNTER — Encounter: Payer: Self-pay | Admitting: Family Medicine

## 2023-08-25 VITALS — BP 110/60 | HR 68 | Ht 64.0 in | Wt 160.4 lb

## 2023-08-25 DIAGNOSIS — Z7985 Long-term (current) use of injectable non-insulin antidiabetic drugs: Secondary | ICD-10-CM | POA: Diagnosis not present

## 2023-08-25 DIAGNOSIS — J301 Allergic rhinitis due to pollen: Secondary | ICD-10-CM | POA: Diagnosis not present

## 2023-08-25 DIAGNOSIS — Z23 Encounter for immunization: Secondary | ICD-10-CM

## 2023-08-25 DIAGNOSIS — H16223 Keratoconjunctivitis sicca, not specified as Sjogren's, bilateral: Secondary | ICD-10-CM | POA: Diagnosis not present

## 2023-08-25 DIAGNOSIS — R200 Anesthesia of skin: Secondary | ICD-10-CM | POA: Diagnosis not present

## 2023-08-25 DIAGNOSIS — I152 Hypertension secondary to endocrine disorders: Secondary | ICD-10-CM

## 2023-08-25 DIAGNOSIS — R1906 Epigastric swelling, mass or lump: Secondary | ICD-10-CM | POA: Diagnosis not present

## 2023-08-25 DIAGNOSIS — H43393 Other vitreous opacities, bilateral: Secondary | ICD-10-CM | POA: Diagnosis not present

## 2023-08-25 DIAGNOSIS — E1159 Type 2 diabetes mellitus with other circulatory complications: Secondary | ICD-10-CM

## 2023-08-25 DIAGNOSIS — E119 Type 2 diabetes mellitus without complications: Secondary | ICD-10-CM | POA: Diagnosis not present

## 2023-08-25 DIAGNOSIS — Z0100 Encounter for examination of eyes and vision without abnormal findings: Secondary | ICD-10-CM | POA: Diagnosis not present

## 2023-08-25 DIAGNOSIS — Z961 Presence of intraocular lens: Secondary | ICD-10-CM | POA: Diagnosis not present

## 2023-08-25 DIAGNOSIS — I83023 Varicose veins of left lower extremity with ulcer of ankle: Secondary | ICD-10-CM | POA: Diagnosis not present

## 2023-08-25 DIAGNOSIS — T148XXA Other injury of unspecified body region, initial encounter: Secondary | ICD-10-CM

## 2023-08-25 DIAGNOSIS — S81802D Unspecified open wound, left lower leg, subsequent encounter: Secondary | ICD-10-CM

## 2023-08-25 DIAGNOSIS — L97329 Non-pressure chronic ulcer of left ankle with unspecified severity: Secondary | ICD-10-CM

## 2023-08-25 DIAGNOSIS — R7401 Elevation of levels of liver transaminase levels: Secondary | ICD-10-CM

## 2023-08-25 DIAGNOSIS — R29898 Other symptoms and signs involving the musculoskeletal system: Secondary | ICD-10-CM

## 2023-08-25 LAB — HM DIABETES EYE EXAM

## 2023-08-25 MED ORDER — POLYETHYLENE GLYCOL 3350 17 GM/SCOOP PO POWD: 17.0000 g | Freq: Every day | ORAL | 5 refills | Status: AC | PRN

## 2023-08-25 MED ORDER — FLUTICASONE PROPIONATE 50 MCG/ACT NA SUSP: 2.0000 | Freq: Every day | NASAL | 5 refills | Status: DC

## 2023-08-25 MED ORDER — CETIRIZINE HCL 10 MG PO TABS: 10.0000 mg | ORAL_TABLET | Freq: Every day | ORAL | 2 refills | Status: AC | PRN

## 2023-08-25 NOTE — Progress Notes (Signed)
[name] is alone Sources of clinical information for visit is/are patient. Nursing assessment for this office visit was reviewed with the patient for accuracy and revision.     Previous Report(s) Reviewed: none     08/25/2023    8:38 AM  Depression screen PHQ 2/9  Decreased Interest 0  Down, Depressed, Hopeless 0  PHQ - 2 Score 0  Altered sleeping 1  Tired, decreased energy 0  Change in appetite 0  Feeling bad or failure about yourself  0  Trouble concentrating 0  Moving slowly or fidgety/restless 0  Suicidal thoughts 0  PHQ-9 Score 1  Difficult doing work/chores Not difficult at all   [employer] Office Visit from 08/25/2023 in [facility] Office Visit from 06/30/2023 in [facility] Office Visit from 03/03/2023 in [facility]  Thoughts that you would be better off dead, or of hurting yourself in some way Not at all Not at all Not at all  PHQ-9 Total Score 1 0 1          08/25/2023    8:37 AM 06/30/2023    3:14 PM 06/15/2023    4:53 PM 01/24/2023    2:00 PM 12/02/2022    8:49 AM  Fall Risk   Falls in the past year? 1 0 0 1 0  Number falls in past yr: 0 0 0 0 0  Injury with Fall? 1 0 0 1 0  Risk for fall due to :   No Fall Risks    Follow up   Falls prevention discussed;Education provided;Falls evaluation completed         08/25/2023    8:38 AM 06/30/2023    3:15 PM 06/15/2023    4:52 PM  PHQ9 SCORE ONLY  PHQ-9 Total Score 1 0 0    There are no preventive care reminders to display for this patient.  Health Maintenance Due  Topic Date Due   Diabetic kidney evaluation - Urine ACR  Never done   OPHTHALMOLOGY EXAM  01/14/2023   INFLUENZA VACCINE  06/02/2023   COVID-19 Vaccine (6 - 2023-24 season) 07/03/2023      History/P.E. limitations: none  There are no preventive care reminders to display for this patient.  Diabetes Health Maintenance Due  Topic Date Due   OPHTHALMOLOGY EXAM   01/14/2023   HEMOGLOBIN A1C  12/30/2023    Health Maintenance Due  Topic Date Due   Diabetic kidney evaluation - Urine ACR  Never done   OPHTHALMOLOGY EXAM  01/14/2023   INFLUENZA VACCINE  06/02/2023   COVID-19 Vaccine (6 - 2023-24 season) 07/03/2023     Chief Complaint  Patient presents with   Black spot rt ankle     --------------------------------------------------------------------------------------------------------------------------------------------- Visit Problem List with A/P  No problem-specific Assessment & Plan notes found for this encounter.

## 2023-08-25 NOTE — Patient Instructions (Addendum)
Please come by tomorrow morning at 8:30 to have Dr Tristan Proto putting a soft wrap boot called an [employer] on your left leg.   A referral to the Wound care center was made.  Please let our office know if you have not heard from them within 5 business days.   A referral was sent to occupational therapy to work on the strength in your left hand.      Its time to get your annual Diabetes Eye Exam.  Your last Exam was in March of 2023 at [facility].

## 2023-08-26 ENCOUNTER — Ambulatory Visit (INDEPENDENT_AMBULATORY_CARE_PROVIDER_SITE_OTHER): Payer: Medicare HMO | Admitting: Family Medicine

## 2023-08-26 ENCOUNTER — Encounter: Payer: Self-pay | Admitting: Family Medicine

## 2023-08-26 VITALS — BP 126/79 | HR 77 | Ht 64.0 in | Wt 162.0 lb

## 2023-08-26 DIAGNOSIS — R1906 Epigastric swelling, mass or lump: Secondary | ICD-10-CM | POA: Insufficient documentation

## 2023-08-26 DIAGNOSIS — I83023 Varicose veins of left lower extremity with ulcer of ankle: Secondary | ICD-10-CM | POA: Diagnosis not present

## 2023-08-26 DIAGNOSIS — R29898 Other symptoms and signs involving the musculoskeletal system: Secondary | ICD-10-CM

## 2023-08-26 DIAGNOSIS — L97329 Non-pressure chronic ulcer of left ankle with unspecified severity: Secondary | ICD-10-CM

## 2023-08-26 HISTORY — DX: Other symptoms and signs involving the musculoskeletal system: R29.898

## 2023-08-26 LAB — MICROALBUMIN / CREATININE URINE RATIO
Creatinine, Urine: 7.4 mg/dL
Microalbumin, Urine: <3 ug/mL

## 2023-08-26 MED ORDER — HYDROCODONE-ACETAMINOPHEN 5-325 MG PO TABS
1.0000 | ORAL_TABLET | Freq: Every evening | ORAL | 0 refills | Status: DC | PRN
Start: 2023-08-26 — End: 2023-09-02

## 2023-08-26 NOTE — Assessment & Plan Note (Signed)
Established problem Well Controlled. Patient is at goal of < 140/90 No signs of complications, medication side effects, or red flags. Consider stopping hydrochlorothiazide in future Decrease Lasix from 40 mg daily to 40 mg QOD Continue current medications

## 2023-08-26 NOTE — Assessment & Plan Note (Signed)
New issue Ms Kinyon just noticed a lump in about the middle of her abdominal wall. 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  Exam Abdominal wall there is a palpable subcutaneous firmness just superior to what appears to be a surgical portal scar. It is not tender.  It does not vary in size or palpability with coughing.   A/ Abdominal wall mass - Uncertain if this is a ventral site hernia. P/ Referral to Dr Lily Peer (Gen Surg) for his opinion whether there is indeed a palpable firmness and it present, could it be a ventral wall hernia requiring surgical repair.

## 2023-08-26 NOTE — Progress Notes (Signed)
Procedure Diagnosis: left leg venous insufficiency ulcer Operator: Levin Erp, MD & supervised by Tawanna Cooler Clell Trahan MD, physician faculty.   I was present during the entire procedure. Please see my office visit note from yesterday for details of the wound and its history.  Procedure: Armed forces operational officer Consent: yes, verbal Site: left foreleg and ankle Prep: none required Material: Radio broadcast assistant and ACE wrap Instruments: none Actions: Wrap of left foot/ankle and foreleg with Radio broadcast assistant. Applied Unna boot wrapped loosely in 4 in ACE wrap.  Complications: Patient comfortable at end of procedure.

## 2023-08-26 NOTE — Assessment & Plan Note (Signed)
Wound Check: Patient presents for wound check. Patient has a venous insufficiency wound which is located on the left leg and just proximal to lateral malleolus . Current symptoms: pain: throbbing, worse at bedtime - keeping her awake - percocet decreases pain enough to sleep. Symptoms began 2 month ago after hitting her left leg against her bed frame. Interventions to date:  colloidal dressing and recommendation for compression stockings (not purchased). has gradually enlarged over this time.  No discharge/drainage.  Tender to touch. No fevers/chills.   A second spontaneous wound on right anterolateral foreleg.  Not painful.    PMH sign for two nonhealing wounds right leg in 2017 requiring care at wound care center.  Appears that at least one wound was considered venous insufficiency ulcer.  Pain in wounds required frequent, prolonged prescription on oxycodone medication, up to 10 mg oxy doses. 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 Arbie Cookey (Vasc). She received a wound graft.  While attending the wound center in 2017 Ms [place] developed a left lower leg wound,   Current wound of left leg appears to be in a location similar to the photograph of left leg wound of 2017.  The left leg wound became quite large (see 10/20/2016 photo).  Dr Arbie Cookey [facility] [place]) found Left leg venous duplex reveals deep and superficial venous incompetence with enlargement of her saphenous vein to her leg in 10/2016 - Dr Early ablated this left saphenous vein with laser as well. Left leg wounds required grafting.   Ms Tera Mater was followed for over a year at the wound center for her chronic bilateral venous insufficiency wounds.  She was on oxycodone analgesic therapy for over a year 2017-18.    Left leg  Left leg  Right leg  1+ pitting ankle edema left leg > right leg   Wound left lateral :  Location: left lateral ankle   ~ 1.5 cm  diameter with < 1 mm depth..  There is not evidence of sinus tract or tunneling or undermining.  There is not drainage present.    The wound margin is slightly rounded and intact.  There is some spots granulation tissue present.  There is  areas of gray-Reta slough present.  The periwound skin appearance is slightly macerated.  There is no evidence of surrounding erythema or infection at this time.  Wound right leg:  Location: right anterolateral mid-to-proximal foreleg  ~ 0.5 centimeters diameter. < 1 mm depth  There is no drainange present.    The wound margin is flat and intact.  There is no granulation tissue present.  Dry surface There is no areas of yellow or necrotic tissue present.  There is no  evidence of surrounding erythema or infection at this time.  Arterial doppler of DP pulse was strong and biphasic sound   A/ Left lateral leg venous insufficiency wound P/ After discussion of risks and benefits, Ms Wisener agreed to placement of a Unna boot with goal of compression therapy and improved vehous retrun dynamics.  Ms Canion did not have time to have the boot wrap appled, so she was schedule to have it applied at 8:30 am 10[facility]25 [facility].  Given Ms Clearman's history of prolonged, complicated course of leg wounds in past, a referral was placed to the wound care center.   A/ Right foreleg wound Uncertain if this is an venous insufficeincy wound, or some traumatic wound.  P/ Colloidal dressing to promote wound closure by promoting a local wound environment conducive to healing.

## 2023-08-26 NOTE — Assessment & Plan Note (Signed)
Established problem worsened.  [name] has had left hand weakness and painless numbness in all finger of her left hand that did not improve with carpal tunnel release surgery some years ago.  She feels the weakness in the hand has been worsening.  Dropping objects. More clumsy with left hand.  She is right handed fortunately.   Exam left hand No palmar muscles wasting. Grip is 4+/5 - I am able to escape her grip on my fingers, but with slight increased effort. Negative Phalens, Negative Tinel  A/ Left hand weakness - possibly some residual sensorimotor neuropathy that Carpal Tunnel Release was unable to completely resolve.  Given the focal nature of the complaint, I suspect diabetic neuropathy less.  P/ Referral to Occupational Therapy for evaluation and treatment. Should there be insufficient strength response to therapy, would likely order ENG/EMG study of left arm to look for other possible neuropathy.

## 2023-08-26 NOTE — Progress Notes (Deleted)
[name] is {Pc accompanied by:5710} Sources of clinical information for visit is/are {Information source:60032}. Nursing assessment for this office visit was reviewed with the patient for accuracy and revision.     Previous Report(s) Reviewed: {Outside review:15817}     08/26/2023    9:01 AM  Depression screen PHQ 2/9  Decreased Interest 0  Down, Depressed, Hopeless 0  PHQ - 2 Score 0  Altered sleeping 1  Tired, decreased energy 0  Change in appetite 0  Feeling bad or failure about yourself  0  Trouble concentrating 0  Moving slowly or fidgety/restless 0  Suicidal thoughts 0  PHQ-9 Score 1  Difficult doing work/chores Not difficult at all   Flowsheet Row Office Visit from 08/26/2023 in [facility] Office Visit from 08/25/2023 in [facility] Office Visit from 06/30/2023 in [facility]  Thoughts that you would be better off dead, or of hurting yourself in some way Not at all Not at all Not at all  PHQ-9 Total Score 1 1 0          08/26/2023    9:01 AM 08/25/2023    8:37 AM 06/30/2023    3:14 PM 06/15/2023    4:53 PM 01/24/2023    2:00 PM  Fall Risk   Falls in the past year? 0 1 0 0 1  Number falls in past yr: 0 0 0 0 0  Injury with Fall? 0 1 0 0 1  Risk for fall due to :    No Fall Risks   Follow up    Falls prevention discussed;Education provided;Falls evaluation completed        08/26/2023    9:01 AM 08/25/2023    8:38 AM 06/30/2023    3:15 PM  PHQ9 SCORE ONLY  PHQ-9 Total Score 1 1 0    There are no preventive care reminders to display for this patient.  Health Maintenance Due  Topic Date Due   Diabetic kidney evaluation - Urine ACR  Never done   OPHTHALMOLOGY EXAM  01/14/2023      History/P.E. limitations: {exam; limitations ed:60112}  There are no preventive care reminders to display for this patient.  Diabetes Health Maintenance Due  Topic Date Due   OPHTHALMOLOGY EXAM   01/14/2023   HEMOGLOBIN A1C  12/30/2023    Health Maintenance Due  Topic Date Due   Diabetic kidney evaluation - Urine ACR  Never done   OPHTHALMOLOGY EXAM  01/14/2023     No chief complaint on file.    --------------------------------------------------------------------------------------------------------------------------------------------- Visit Problem List with A/P  No problem-specific Assessment & Plan notes found for this encounter.

## 2023-08-29 ENCOUNTER — Encounter: Payer: Self-pay | Admitting: Family Medicine

## 2023-09-02 ENCOUNTER — Other Ambulatory Visit: Payer: Self-pay

## 2023-09-02 ENCOUNTER — Ambulatory Visit: Payer: Medicare HMO

## 2023-09-02 ENCOUNTER — Ambulatory Visit: Payer: Medicare HMO | Admitting: Podiatry

## 2023-09-02 DIAGNOSIS — I83023 Varicose veins of left lower extremity with ulcer of ankle: Secondary | ICD-10-CM

## 2023-09-02 DIAGNOSIS — S81802D Unspecified open wound, left lower leg, subsequent encounter: Secondary | ICD-10-CM

## 2023-09-02 NOTE — Telephone Encounter (Signed)
During RN visit for wound check patient forgot to ask for pain medication refill.   Will forward to PCP.

## 2023-09-02 NOTE — Progress Notes (Signed)
Patient presents to nurse clinic for dressing change and wound check.   Patient had unna boot placed on 10/25 to LE for venous insufficiency in left leg.   Patient reports the unna boot was uncomfortable and caused burning and pain through the week. She would like to try something else.    Patient denies fever, chills or redness to the area. Dr. McDiarmid assessed patient and took picture for chart. Located under media tab.   Per Dr. McDiarmid, will dress wound with Meglisorb Plus Pad and Duoderm and wrap leg.    Patient will return on 11/8 for wound check.

## 2023-09-05 ENCOUNTER — Other Ambulatory Visit: Payer: Self-pay | Admitting: Internal Medicine

## 2023-09-05 MED ORDER — HYDROCODONE-ACETAMINOPHEN 5-325 MG PO TABS
1.0000 | ORAL_TABLET | Freq: Every evening | ORAL | 0 refills | Status: AC | PRN
Start: 2023-09-05 — End: 2023-10-05

## 2023-09-06 ENCOUNTER — Ambulatory Visit (INDEPENDENT_AMBULATORY_CARE_PROVIDER_SITE_OTHER): Payer: Medicare HMO | Admitting: Student

## 2023-09-06 ENCOUNTER — Telehealth: Payer: Self-pay

## 2023-09-06 VITALS — BP 120/76 | HR 82 | Ht 64.0 in | Wt 162.0 lb

## 2023-09-06 DIAGNOSIS — I83023 Varicose veins of left lower extremity with ulcer of ankle: Secondary | ICD-10-CM | POA: Diagnosis not present

## 2023-09-06 DIAGNOSIS — L97329 Non-pressure chronic ulcer of left ankle with unspecified severity: Secondary | ICD-10-CM | POA: Diagnosis not present

## 2023-09-06 NOTE — Patient Instructions (Addendum)
Ms. Mangiapane, it does not look like your wound is infected. In fact, it seems to be healing well. However, because your bandage is getting dirty before time for your next change, I suspect you'll need more frequent changes. Let's bring you back to our office on Friday for your next change and then look at doing Tuesdays and Fridays until wound care can see you on the 27th.Eliezer Mccoy, MD

## 2023-09-06 NOTE — Telephone Encounter (Signed)
I called patient to schedule wound follow-up for 11/8 in nurse clinic.   Patient reports malodorous drainage. She reports she can smell the area while she is fully clothed. She reports she has not taken the wrap off from Friday. See nurse visit from 11/8.  She denies any fevers, chills or body aches.   Patient scheduled for later this afternoon in ATC.  Will cancel nurse apt at this time.

## 2023-09-06 NOTE — Progress Notes (Signed)
    SUBJECTIVE:   CHIEF COMPLAINT / HPI:   Wound Care  Venous Stasis Ulcer Ms. Dix is a patient of Dr. McDiarmid's followed for a venous stasis ulcer of the left lower extremity.  She was last seen by her nursing clinic on 11/1 and had her dressing changed from an Unna boot to a Meglisorb plus and DuoDERM dressing due to some burning related to the [employer].  Unfortunately, she comes back today due to having soiled the dressing with discharge and finding that she can smell an odor through the dressing.  She does not have any new pain, fever, chills, or symptoms otherwise. She has been referred to the wound care center and has an upcoming appointment on 11/21. OBJECTIVE:   BP 120/76   Pulse 82   Ht 5\' 4"  (1.626 m)   Wt 162 lb (73.5 kg)   SpO2 100%   BMI 27.81 kg/m   Gen: In good spirits and NAD Left lower extremity dressing was indeed soiled with serosanguineous discharge.  Upon removal of the dressing, the wound base is clear and without purulence or malodor.  No surrounding erythema, warmth, streaking to suggest localized infection.   ASSESSMENT/PLAN:   Venous ulcer of ankle, left (HCC) Despite her complaint of malodor, once the old dressing was removed and the wound cleaned, I do not believe that there is actually any malodorous discharge coming from the wound itself, suspect that this was all coming from the soiled dressing.  The wound appears to be healing appropriately, I do not see any evidence of infection. However, given that she is soiling her dressing and just few days, I think she is going to need more frequent dressing changes than she was previously scheduling scheduled for.  I offered to her home dressing changes, but she prefers to come here and have our nursing staff do it.  Shared decision making with the patient and our nursing staff to move to twice weekly dressing changes until she sees wound care on 11/21, then will transition her to their care. -Placed in a  Meglisorb + DuoDERM dressing today. -Nursing visit on 11/8 for her next dressing change, will then return on 11/12 and come in twice weekly on Tuesdays and Fridays until she sees wound care on 11/21     J Dorothyann Gibbs, MD [facility]

## 2023-09-07 NOTE — Assessment & Plan Note (Signed)
Despite her complaint of malodor, once the old dressing was removed and the wound cleaned, I do not believe that there is actually any malodorous discharge coming from the wound itself, suspect that this was all coming from the soiled dressing.  The wound appears to be healing appropriately, I do not see any evidence of infection. However, given that she is soiling her dressing and just few days, I think she is going to need more frequent dressing changes than she was previously scheduling scheduled for.  I offered to her home dressing changes, but she prefers to come here and have our nursing staff do it.  Shared decision making with the patient and our nursing staff to move to twice weekly dressing changes until she sees wound care on 11/21, then will transition her to their care. -Placed in a Meglisorb + DuoDERM dressing today. -Nursing visit on 11/8 for her next dressing change, will then return on 11/12 and come in twice weekly on Tuesdays and Fridays until she sees wound care on 11/21

## 2023-09-09 ENCOUNTER — Ambulatory Visit: Payer: Medicare HMO

## 2023-09-09 ENCOUNTER — Ambulatory Visit (INDEPENDENT_AMBULATORY_CARE_PROVIDER_SITE_OTHER): Payer: Medicare HMO

## 2023-09-09 DIAGNOSIS — Z5189 Encounter for other specified aftercare: Secondary | ICD-10-CM

## 2023-09-12 DIAGNOSIS — S8002XA Contusion of left knee, initial encounter: Secondary | ICD-10-CM | POA: Diagnosis not present

## 2023-09-12 DIAGNOSIS — S8001XA Contusion of right knee, initial encounter: Secondary | ICD-10-CM | POA: Diagnosis not present

## 2023-09-12 DIAGNOSIS — M25511 Pain in right shoulder: Secondary | ICD-10-CM | POA: Diagnosis not present

## 2023-09-12 NOTE — Progress Notes (Signed)
Patient presents to nurse clinic for dressing change and wound check.    Patient denies fever, chills or redness to the area. Dr. Lum Babe assessed patient and took picture for chart. Located under media tab.    Cleansed leg with soap and warm water. Wound redressed with Meglisorb Plus Pad and Duoderm and wrapped leg.    Patient scheduled for nurse visit on Tuesday, 09/13/23 for wound care follow up.   Veronda Prude, RN

## 2023-09-13 ENCOUNTER — Ambulatory Visit (INDEPENDENT_AMBULATORY_CARE_PROVIDER_SITE_OTHER): Payer: Medicare HMO

## 2023-09-13 DIAGNOSIS — Z5189 Encounter for other specified aftercare: Secondary | ICD-10-CM

## 2023-09-13 NOTE — Progress Notes (Signed)
Patient presents to nurse clinic for wound follow up.   Purulent drainage noted on dressing. Dr. Marisue Humble came and looked at wound and took picture for chart. Located under media tab.   Cleansed leg with soap and warm water. Wound redressed with Meglisorb Plus Pad and Duoderm and wrapped leg.   Patient scheduled for follow up nursing visit on Friday, 09/16/23 for follow up.   Patient also reports that wound appointment was moved up from 11/27 to 11/25.  Veronda Prude, RN

## 2023-09-16 ENCOUNTER — Ambulatory Visit (INDEPENDENT_AMBULATORY_CARE_PROVIDER_SITE_OTHER): Payer: Medicare HMO

## 2023-09-16 DIAGNOSIS — Z5189 Encounter for other specified aftercare: Secondary | ICD-10-CM

## 2023-09-20 ENCOUNTER — Ambulatory Visit (INDEPENDENT_AMBULATORY_CARE_PROVIDER_SITE_OTHER): Payer: Medicare HMO

## 2023-09-20 DIAGNOSIS — M79642 Pain in left hand: Secondary | ICD-10-CM | POA: Diagnosis not present

## 2023-09-20 DIAGNOSIS — M25532 Pain in left wrist: Secondary | ICD-10-CM | POA: Diagnosis not present

## 2023-09-20 DIAGNOSIS — Z5189 Encounter for other specified aftercare: Secondary | ICD-10-CM

## 2023-09-20 NOTE — Progress Notes (Signed)
Patient presents to nurse clinic for wound follow up.    Purulent drainage noted on dressing. Dr. McDiarmid came and looked at wound and took picture for chart. Located under media tab.    Cleansed leg with soap and warm water. Wound redressed with Meglisorb Plus Pad and Duoderm and wrapped leg.  Patient scheduled for follow up nursing visit on Tuesday, 09/20/23.   Veronda Prude, RN

## 2023-09-20 NOTE — Progress Notes (Signed)
Patient presents to nurse clinic for wound follow up.    Purulent drainage noted on dressing. Dr. Manson Passey came and looked at wound and took picture for chart. Located under media tab.    Cleansed leg with soap and warm water. Wound redressed with Meglisorb Plus Pad and Duoderm and wrapped leg.  Patient has follow up with Dr. McDiarmid scheduled on Thursday, 09/22/23.  Veronda Prude, RN

## 2023-09-22 ENCOUNTER — Encounter: Payer: Self-pay | Admitting: Family Medicine

## 2023-09-22 ENCOUNTER — Ambulatory Visit: Payer: Medicare HMO | Admitting: Family Medicine

## 2023-09-22 VITALS — BP 129/88 | HR 73 | Ht 64.0 in | Wt 165.4 lb

## 2023-09-22 DIAGNOSIS — I83019 Varicose veins of right lower extremity with ulcer of unspecified site: Secondary | ICD-10-CM | POA: Diagnosis not present

## 2023-09-22 DIAGNOSIS — L97919 Non-pressure chronic ulcer of unspecified part of right lower leg with unspecified severity: Secondary | ICD-10-CM

## 2023-09-22 DIAGNOSIS — E66811 Obesity, class 1: Secondary | ICD-10-CM | POA: Diagnosis not present

## 2023-09-22 DIAGNOSIS — E119 Type 2 diabetes mellitus without complications: Secondary | ICD-10-CM | POA: Diagnosis not present

## 2023-09-22 DIAGNOSIS — I83023 Varicose veins of left lower extremity with ulcer of ankle: Secondary | ICD-10-CM | POA: Diagnosis not present

## 2023-09-22 DIAGNOSIS — L97329 Non-pressure chronic ulcer of left ankle with unspecified severity: Secondary | ICD-10-CM

## 2023-09-22 LAB — POCT GLYCOSYLATED HEMOGLOBIN (HGB A1C): HbA1c, POC (controlled diabetic range): 5.6 % (ref 0.0–7.0)

## 2023-09-22 MED ORDER — CLINDAMYCIN PALMITATE HCL 75 MG/5ML PO SOLR
ORAL | 0 refills | Status: DC
Start: 2023-09-22 — End: 2024-03-22

## 2023-09-22 NOTE — Progress Notes (Signed)
[name] is alone Sources of clinical information for visit is/are patient. Nursing assessment for this office visit was reviewed with the patient for accuracy and revision.     Previous Report(s) Reviewed: none     09/22/2023    8:37 AM  Depression screen PHQ 2/9  Decreased Interest 0  Down, Depressed, Hopeless 0  PHQ - 2 Score 0  Altered sleeping 1  Tired, decreased energy 0  Change in appetite 0  Feeling bad or failure about yourself  0  Trouble concentrating 0  Moving slowly or fidgety/restless 0  Suicidal thoughts 0  PHQ-9 Score 1   Flowsheet Row Office Visit from 09/22/2023 in [facility] - [facility] Of [place]. [facility] Office Visit from 09/06/2023 in [facility] - [facility] Of [place]. [facility] Office Visit from 08/26/2023 in [facility] - A Dept Of [place]. [facility]  Thoughts that you would be better off dead, or of hurting yourself in some way Not at all Not at all Not at all  PHQ-9 Total Score 1 0 1          09/22/2023    8:37 AM 09/06/2023    3:48 PM 08/26/2023    9:01 AM 08/25/2023    8:37 AM 06/30/2023    3:14 PM  Fall Risk   Falls in the past year? 1 0 0 1 0  Number falls in past yr: 0 0 0 0 0  Injury with Fall? 0 0 0 1 0       09/22/2023    8:37 AM 09/06/2023    3:48 PM 08/26/2023    9:01 AM  PHQ9 SCORE ONLY  PHQ-9 Total Score 1 0 1    There are no preventive care reminders to display for this patient.  Health Maintenance Due  Topic Date Due   OPHTHALMOLOGY EXAM  01/14/2023      History/P.E. limitations: none  There are no preventive care reminders to display for this patient.  Diabetes Health Maintenance Due  Topic Date Due   OPHTHALMOLOGY EXAM  01/14/2023   HEMOGLOBIN A1C  03/21/2024    Health Maintenance Due  Topic Date Due   OPHTHALMOLOGY EXAM  01/14/2023     No chief complaint on file.     --------------------------------------------------------------------------------------------------------------------------------------------- Visit Problem List with A/P  Venous ulcer of ankle, left (HCC)  Has been keeping covered with hydrocolloidal dressing for last several weeks.  Moist base with some granualtion tissue No tunneling nor undermining Maceration of surrounding skin  Recommendation Cover with dry gauze and wrap with gauze or loosely with Coban.Change daily. Initial visit with wound center next week.      Venous ulcer of right leg (HCC)  Has been keeping covered with hydrocolloidal dressing for last several weeks.  Moist base with some granualtion tissue No tunneling nor undermining Maceration of surrounding skin   Recommendation Cover with dry gauze and wrap with gauze or loosely with Coban.Change daily. Initial visit with wound center next week.

## 2023-09-22 NOTE — Patient Instructions (Addendum)
The Wound Center will be taking over the care of your wounds after you see them.  Apply to your left leg sore with a covering gauze pad, then wrap with Coban (wrap that sticks to itself).  Change gauze pad once a day.   Apply to your right leg sore gauze once a day, then wrap with either Coban or gauze wrap.  Change the gauze pad once a day.    Apply the Clindamycin solution three time a day to your left leg skin sore then re-wrap.  This is to control the odor from the wound.    If it turns out that the prescription cost for the clindamycin is too much, use the over-the-counter Neosporin ointment twice a day to the wound to control the odor.    Your Blood sugar is under excellent control.  Keep taking your diabetes medications like you are.

## 2023-09-23 DIAGNOSIS — L97929 Non-pressure chronic ulcer of unspecified part of left lower leg with unspecified severity: Secondary | ICD-10-CM

## 2023-09-23 DIAGNOSIS — I83019 Varicose veins of right lower extremity with ulcer of unspecified site: Secondary | ICD-10-CM | POA: Insufficient documentation

## 2023-09-23 HISTORY — DX: Non-pressure chronic ulcer of unspecified part of left lower leg with unspecified severity: L97.929

## 2023-09-23 NOTE — Assessment & Plan Note (Addendum)
  Has been keeping covered with hydrocolloidal dressing for last several weeks.  Moist base with some granualtion tissue No tunneling nor undermining Maceration of surrounding skin  Recommendation Cover with dry gauze and wrap with gauze or loosely with Coban.Change daily. Initial visit with wound center next week.

## 2023-09-23 NOTE — Assessment & Plan Note (Signed)
  Has been keeping covered with hydrocolloidal dressing for last several weeks.  Moist base with some granualtion tissue No tunneling nor undermining Maceration of surrounding skin   Recommendation Cover with dry gauze and wrap with gauze or loosely with Coban.Change daily. Initial visit with wound center next week.

## 2023-09-24 ENCOUNTER — Other Ambulatory Visit: Payer: Self-pay | Admitting: Family Medicine

## 2023-09-26 ENCOUNTER — Encounter (HOSPITAL_BASED_OUTPATIENT_CLINIC_OR_DEPARTMENT_OTHER): Payer: Medicare HMO | Attending: General Surgery | Admitting: General Surgery

## 2023-09-28 ENCOUNTER — Ambulatory Visit (HOSPITAL_BASED_OUTPATIENT_CLINIC_OR_DEPARTMENT_OTHER): Payer: Medicare HMO | Admitting: Physician Assistant

## 2023-10-04 DIAGNOSIS — M79642 Pain in left hand: Secondary | ICD-10-CM | POA: Diagnosis not present

## 2023-10-04 DIAGNOSIS — M25532 Pain in left wrist: Secondary | ICD-10-CM | POA: Diagnosis not present

## 2023-10-11 DIAGNOSIS — M25532 Pain in left wrist: Secondary | ICD-10-CM | POA: Diagnosis not present

## 2023-10-11 DIAGNOSIS — M79642 Pain in left hand: Secondary | ICD-10-CM | POA: Diagnosis not present

## 2023-10-18 ENCOUNTER — Telehealth: Payer: Self-pay

## 2023-10-18 NOTE — Telephone Encounter (Signed)
Left message informing patient her novo nordisk shipment is ready for pickup. Also informed she is due for re-enrollment, and that the application will be placed in her bag.   2 boxes of Ozempic 2mg  dose pens are labeled and ready in med room (2 month supply).

## 2023-10-27 ENCOUNTER — Encounter: Payer: Self-pay | Admitting: Podiatry

## 2023-10-27 ENCOUNTER — Ambulatory Visit: Payer: Medicare HMO | Admitting: Podiatry

## 2023-10-27 DIAGNOSIS — E1151 Type 2 diabetes mellitus with diabetic peripheral angiopathy without gangrene: Secondary | ICD-10-CM

## 2023-10-27 DIAGNOSIS — B351 Tinea unguium: Secondary | ICD-10-CM

## 2023-10-27 DIAGNOSIS — E1169 Type 2 diabetes mellitus with other specified complication: Secondary | ICD-10-CM | POA: Diagnosis not present

## 2023-10-27 DIAGNOSIS — E119 Type 2 diabetes mellitus without complications: Secondary | ICD-10-CM

## 2023-10-27 NOTE — Progress Notes (Signed)
This patient presents to the office with chief complaint of long thick painful nails.  Patient says the nails are painful walking and wearing shoes.  This patient is unable to self treat.  This patient is unable to trim her nails since she is unable to reach her nails.  She presents to the office for preventative foot care services.  General Appearance  Alert, conversant and in no acute stress.  Vascular  Dorsalis pedis and posterior tibial  pulses are  weakly palpable  bilaterally.  Capillary return is within normal limits  bilaterally. Temperature is within normal limits  bilaterally.  Neurologic  Senn-Weinstein monofilament wire test within normal limits  bilaterally. Muscle power within normal limits bilaterally.  Nails Thick disfigured discolored nails with subungual debris  from hallux to fifth toes bilaterally. No evidence of bacterial infection or drainage bilaterally.  Orthopedic  No limitations of motion  feet .  No crepitus or effusions noted.  HAV  B/L.  Hammer toes  B/L.  Skin  normotropic skin with no porokeratosis noted bilaterally.  No signs of infections or ulcers noted.     Onychomycosis  Nails  B/L.  Pain in right toes  Pain in left toes  Debridement of nails both feet followed trimming the nails with dremel tool.   RTC 4  months.   Helane Gunther DPM

## 2023-11-01 ENCOUNTER — Encounter: Payer: Medicare HMO | Attending: Internal Medicine | Admitting: Internal Medicine

## 2023-11-01 DIAGNOSIS — D509 Iron deficiency anemia, unspecified: Secondary | ICD-10-CM | POA: Diagnosis not present

## 2023-11-01 DIAGNOSIS — L97821 Non-pressure chronic ulcer of other part of left lower leg limited to breakdown of skin: Secondary | ICD-10-CM | POA: Insufficient documentation

## 2023-11-01 DIAGNOSIS — K5730 Diverticulosis of large intestine without perforation or abscess without bleeding: Secondary | ICD-10-CM | POA: Insufficient documentation

## 2023-11-01 DIAGNOSIS — I110 Hypertensive heart disease with heart failure: Secondary | ICD-10-CM | POA: Diagnosis not present

## 2023-11-01 DIAGNOSIS — E11622 Type 2 diabetes mellitus with other skin ulcer: Secondary | ICD-10-CM | POA: Insufficient documentation

## 2023-11-01 DIAGNOSIS — E1151 Type 2 diabetes mellitus with diabetic peripheral angiopathy without gangrene: Secondary | ICD-10-CM | POA: Diagnosis not present

## 2023-11-01 DIAGNOSIS — I872 Venous insufficiency (chronic) (peripheral): Secondary | ICD-10-CM | POA: Diagnosis not present

## 2023-11-01 DIAGNOSIS — I87332 Chronic venous hypertension (idiopathic) with ulcer and inflammation of left lower extremity: Secondary | ICD-10-CM | POA: Diagnosis not present

## 2023-11-01 DIAGNOSIS — Z9884 Bariatric surgery status: Secondary | ICD-10-CM | POA: Diagnosis not present

## 2023-11-02 NOTE — Progress Notes (Signed)
 [name], [name] ([id number]) [id number] Page 1 of 5 Visit Report for 11/01/2023 Abuse Risk Screen Details Patient Name: Date of Service: [name], [name] 11/01/2023 8:15 A M Medical Record Number: [id number] Patient Account Number: [id number] Date of Birth/Sex: Treating RN: 1956/07/19 (68 y.o. [name] Vaughn Mora Primary Care Elihu Milstein: MCDIA RMID, TO DD Other Clinician: Referring Chyann Ambrocio: Treating Milah Recht/Extender: RO BSO N, MICHA EL G MCDIA RMID, TO DD Weeks in Treatment: 0 Abuse Risk Screen Items Answer ABUSE RISK SCREEN: Has anyone close to you tried to hurt or harm you recentlyo No Do you feel uncomfortable with anyone in your familyo No Has anyone forced you do things that you didnt want to doo No Electronic Signature(s) Signed: 11/01/2023 3:54:15 PM By: Vaughn Mora Entered By: Vaughn Mora on 11/01/2023 08:38:56 -------------------------------------------------------------------------------- Activities of Daily Living Details Patient Name: Date of Service: [name], [name] 11/01/2023 8:15 A M Medical Record Number: [id number] Patient Account Number: [id number] Date of Birth/Sex: Treating RN: Feb 12, 1956 (68 y.o. [name] Vaughn Mora Primary Care Myan Locatelli: MCDIA RMID, TO DD Other Clinician: Referring Ovadia Lopp: Treating Castulo Scarpelli/Extender: RO BSO N, MICHA EL G MCDIA RMID, TO DD Weeks in Treatment: 0 Activities of Daily Living Items Answer Activities of Daily Living (Please select one for each item) Drive Automobile Completely Able T Medications ake Completely Able Use T elephone Completely Able Care for Appearance Completely Able Use T oilet Completely Able Bath / Shower Completely Able Dress Self Completely Able Feed Self Completely Able Walk Completely Able Get In / Out Bed Completely Able Housework Completely [name], [name] ([id number]) [phone] [id number] Page 2 of 5 Prepare Meals Completely  Able Handle Money Completely Able Shop for Self Completely Able Electronic Signature(s) Signed: 11/01/2023 3:54:15 PM By: Vaughn Mora Entered By: Vaughn Mora on 11/01/2023 08:39:17 -------------------------------------------------------------------------------- Education Screening Details Patient Name: Date of Service: [name], [name] [name] [name] [name]. 11/01/2023 8:15 A M Medical Record Number: [id number] Patient Account Number: [id number] Date of Birth/Sex: Treating RN: 09/13/1956 (68 y.o. [name] Vaughn Mora Primary Care Carrell Palmatier: MCDIA RMID, TO DD Other Clinician: Referring Militza Devery: Treating Thurston Brendlinger/Extender: RO BSO N, MICHA EL G MCDIA RMID, TO DD Weeks in Treatment: 0 Primary Learner Assessed: Patient Learning Preferences/Education Level/Primary Language Learning Preference: Explanation, Demonstration, Video, Communication Board, Printed Material Preferred Language: English Cognitive Barrier Language Barrier: No Translator Needed: No Memory Deficit: No Emotional Barrier: No Cultural/Religious Beliefs Affecting Medical Care: No Physical Barrier Impaired Vision: No Impaired Hearing: No Decreased Hand dexterity: No Knowledge/Comprehension Knowledge Level: High Comprehension Level: High Ability to understand written instructions: High Ability to understand verbal instructions: High Motivation Anxiety Level: Calm Cooperation: Cooperative Education Importance: Acknowledges Need Interest in Health Problems: Asks Questions Perception: Coherent Willingness to Engage in Self-Management High Activities: Readiness to Engage in Self-Management High Activities: Electronic Signature(s) Signed: 11/01/2023 3:54:15 PM By: Vaughn Mora Entered By: Vaughn Mora on 11/01/2023 08:39:36 [name], [name] [name] ([id number]) [id number] Page 3 of 5 -------------------------------------------------------------------------------- Fall Risk Assessment  Details Patient Name: Date of Service: [name], [name] 11/01/2023 8:15 A M Medical Record Number: [id number] Patient Account Number: [id number] Date of Birth/Sex: Treating RN: 11/29/1955 (68 y.o. [name] Vaughn Mora Primary Care Josilynn Losh: MCDIA RMID, TO DD Other Clinician: Referring Jarmon Javid: Treating Breona Cherubin/Extender: RO BSO N, MICHA EL G MCDIA RMID, TO DD Weeks in Treatment: 0 Fall Risk Assessment Items Have you had 2 or more falls in the last 12 monthso 0 No Have you had any fall that resulted in injury in the last 12  monthso 0 No FALLS RISK SCREEN History of falling - immediate or within 3 months 0 No Secondary diagnosis (Do you have 2 or more medical diagnoseso) 0 No Ambulatory aid None/bed rest/wheelchair/nurse 0 Yes Crutches/cane/walker 0 No Furniture 0 No Intravenous therapy Access/Saline/Heparin Lock 0 No Gait/Transferring Normal/ bed rest/ wheelchair 0 Yes Weak (short steps with or without shuffle, stooped but able to lift head while walking, may seek 0 No support from furniture) Impaired (short steps with shuffle, may have difficulty arising from chair, head down, impaired 0 No balance) Mental Status Oriented to own ability 0 Yes Electronic Signature(s) Signed: 11/01/2023 3:54:15 PM By: Vaughn Mora Entered By: Vaughn Mora on 11/01/2023 08:39:45 -------------------------------------------------------------------------------- Foot Assessment Details Patient Name: Date of Service: [name], [name] [name] [name] [name]. 11/01/2023 8:15 A M Medical Record Number: [id number] Patient Account Number: [id number] Date of Birth/Sex: Treating RN: Mar 12, 1956 (68 y.o. [name] Vaughn Mora Primary Care Ovella Manygoats: MCDIA RMID, TO DD Other Clinician: Referring Claudette Wermuth: Treating Tenee Wish/Extender: RO BSO N, MICHA EL G MCDIA RMID, TO DD Weeks in Treatment: 0 Foot Assessment Items [address] [place] ([id number]) [id number] Page 4 of 5 + = Sensation  present, - = Sensation absent, C = Callus, U = Ulcer R = Redness, W = Warmth, M = Maceration, PU = Pre-ulcerative lesion F = Fissure, S = Swelling, D = Dryness Assessment Right: Left: Other Deformity: No No Prior Foot Ulcer: No No Prior Amputation: No No Charcot Joint: No No Ambulatory Status: Ambulatory Without Help Gait: Steady Electronic Signature(s) Signed: 11/01/2023 3:54:15 PM By: Vaughn Mora Entered By: Vaughn Mora on 11/01/2023 08:43:45 -------------------------------------------------------------------------------- Nutrition Risk Screening Details Patient Name: Date of Service: MAGALENE, MCLEAR 11/01/2023 8:15 A M Medical Record Number: [id number] Patient Account Number: [id number] Date of Birth/Sex: Treating RN: [birth year] (68 y.o. [name] Vaughn Mora Primary Care Noelia Lenart: MCDIA RMID, TO DD Other Clinician: Referring Kashonda Sarkisyan: Treating Favio Moder/Extender: RO BSO N, MICHA EL G MCDIA RMID, TO DD Weeks in Treatment: 0 Height (in): 64 Weight (lbs): 160 Body Mass Index (BMI): 27.5 Nutrition Risk Screening Items Score Screening NUTRITION RISK SCREEN: I have an illness or condition that made me change the kind and/or amount of food I eat 0 No I eat fewer than two meals per day 0 No I eat few fruits and vegetables, or milk products 0 No I have three or more drinks of beer, liquor or wine almost every day 0 No I have tooth or mouth problems that make it hard for me to eat 0 No I don't always have enough money to buy the food I need 0 No Fruchter, [name] [name] ([id number]) [phone] [id number] Page 5 of 5 I eat alone most of the time 0 No I take three or more different prescribed or over-the-counter drugs a day 0 No Without wanting to, I have lost or gained 10 pounds in the last six months 0 No I am not always physically able to shop, cook and/or feed myself 0 No Nutrition Protocols Good Risk Protocol 0 No interventions needed Moderate Risk  Protocol High Risk Proctocol Risk Level: Good Risk Score: 0 Electronic Signature(s) Signed: 11/01/2023 3:54:15 PM By: Vaughn Mora Entered By: Vaughn Mora on 11/01/2023 08:39:49

## 2023-11-02 NOTE — Progress Notes (Signed)
 [address], [name] ([id number]) [id number] Page 1 of 10 Visit Report for 11/01/2023 Allergy List Details Patient Name: Date of Service: [name], [name] 11/01/2023 8:15 A M Medical Record Number: [id number] Patient Account Number: [id number] Date of Birth/Sex: Treating RN: [birth year] (68 y.o. [name] Primary Care Keinan Brouillet: MCDIA RMID, TO DD Other Clinician: Referring Quantae Martel: Treating Santiana Glidden/Extender: RO BSO N, MICHA EL G MCDIA RMID, TO DD Weeks in Treatment: 0 Allergies Active Allergies aspirin pantoprazole  NSAIDS (Non-Steroidal Anti-Inflammatory Drug) tolmetin Allergy Notes Electronic Signature(s) Signed: 11/01/2023 3:54:15 PM By: Vaughn [name] Entered By: Gordon, Caitlin on 11/01/2023 08:35:32 -------------------------------------------------------------------------------- Arrival Information Details Patient Name: Date of Service: [name], Hannah [name]. 11/01/2023 8:15 A M Medical Record Number: [id number] Patient Account Number: [id number] Date of Birth/Sex: Treating RN: Nov 10, 1955 (68 y.o. [name] Primary Care Jenalyn Girdner: MCDIA RMID, TO DD Other Clinician: Referring Soyla Bainter: Treating Jeroline Wolbert/Extender: RO BSO N, MICHA EL G MCDIA RMID, TO DD Weeks in Treatment: 0 Visit Information Patient Arrived: Ambulatory Arrival Time: 08:28 Accompanied By: self Transfer Assistance: None Patient Identification Verified: Yes Secondary Verification Process Completed: Yes Patient Has Alerts: Yes Patient Alerts: Patient on Blood Thinner type 2 diabetic [name], [name] ([id number]) History Since Last Visit Added or deleted any medications: No Any new allergies or adverse reactions: No Had a fall or experienced change in activities of daily living that may affect risk of falls: No Hospitalized since last visit: No Has Dressing in Place as Prescribed: Yes Pain Present Now: Yes Electronic Signature(s) Signed: 11/01/2023 9:36:16 AM By:  Vaughn [name] Entered By: Vaughn [name] on 11/01/2023 09:36:16 -------------------------------------------------------------------------------- Clinic Level of Care Assessment Details Patient Name: Date of Service: [name] 11/01/2023 8:15 A M Medical Record Number: [id number] Patient Account Number: [id number] Date of Birth/Sex: Treating RN: 1956-06-05 (68 y.o. [name] Primary Care Alegria Dominique: MCDIA RMID, TO DD Other Clinician: Referring Harlym Gehling: Treating Adyan Palau/Extender: RO BSO N, MICHA EL G MCDIA RMID, TO DD Weeks in Treatment: 0 Clinic Level of Care Assessment Items TOOL 4 Quantity Score []  - 0 Use when only an EandM is performed on FOLLOW-UP visit ASSESSMENTS - Nursing Assessment / Reassessment []  - 0 Reassessment of Co-morbidities (includes updates in patient status) []  - 0 Reassessment of Adherence to Treatment Plan ASSESSMENTS - Wound and Skin A ssessment / Reassessment []  - 0 Simple Wound Assessment / Reassessment - one wound []  - 0 Complex Wound Assessment / Reassessment - multiple wounds []  - 0 Dermatologic / Skin Assessment (not related to wound area) ASSESSMENTS - Focused Assessment []  - 0 Circumferential Edema Measurements - multi extremities []  - 0 Nutritional Assessment / Counseling / Intervention []  - 0 Lower Extremity Assessment (monofilament, tuning fork, pulses) []  - 0 Peripheral Arterial Disease Assessment (using hand held doppler) ASSESSMENTS - Ostomy and/or Continence Assessment and Care []  - 0 Incontinence Assessment and Management []  - 0 Ostomy Care Assessment and Management (repouching, etc.) PROCESS - Coordination of Care []  - 0 Simple Patient / Family Education for ongoing care []  - 0 Complex (extensive) Patient / Family Education for ongoing care []  - 0 Staff obtains Chiropractor, Records, T Results / Process Orders est []  - 0 Staff telephones HHA, Nursing Homes / Clarify orders / etc []  - 0 Routine Transfer to  another Facility (non-emergent condition) []  - 0 Routine Hospital Admission (non-emergent condition) []  - 0 New Admissions / Manufacturing Engineer / Ordering NPWT Apligraf, etc. , []  - 0 Emergency Hospital Admission (emergent condition) []  -  0 Simple Discharge Coordination []  - 0 Complex (extensive) Discharge Coordination [name], [name] ([id number]) [id number] Page 3 of 10 PROCESS - Special Needs []  - 0 Pediatric / Minor Patient Management []  - 0 Isolation Patient Management []  - 0 Hearing / Language / Visual special needs []  - 0 Assessment of Community assistance (transportation, D/C planning, etc.) []  - 0 Additional assistance / Altered mentation []  - 0 Support Surface(s) Assessment (bed, cushion, seat, etc.) INTERVENTIONS - Wound Cleansing / Measurement []  - 0 Simple Wound Cleansing - one wound []  - 0 Complex Wound Cleansing - multiple wounds []  - 0 Wound Imaging (photographs - any number of wounds) []  - 0 Wound Tracing (instead of photographs) []  - 0 Simple Wound Measurement - one wound []  - 0 Complex Wound Measurement - multiple wounds INTERVENTIONS - Wound Dressings []  - 0 Small Wound Dressing one or multiple wounds []  - 0 Medium Wound Dressing one or multiple wounds []  - 0 Large Wound Dressing one or multiple wounds []  - 0 Application of Medications - topical []  - 0 Application of Medications - injection INTERVENTIONS - Miscellaneous []  - 0 External ear exam []  - 0 Specimen Collection (cultures, biopsies, blood, body fluids, etc.) []  - 0 Specimen(s) / Culture(s) sent or taken to Lab for analysis []  - 0 Patient Transfer (multiple staff / Nurse, Adult / Similar devices) []  - 0 Simple Staple / Suture removal (25 or less) []  - 0 Complex Staple / Suture removal (26 or more) []  - 0 Hypo / Hyperglycemic Management (close monitor of Blood Glucose) []  - 0 Ankle / Brachial Index (ABI) - do not check if billed separately []  -  0 Vital Signs Has the patient been seen at the hospital within the last three years: Yes Total Score: 0 Level Of Care: ____ Electronic Signature(s) Signed: 11/01/2023 3:54:15 PM By: Vaughn [name] Entered By: Vaughn [name] on 11/01/2023 14:38:49 Compression Therapy Details -------------------------------------------------------------------------------- [name] ([id number]) [id number] Page 4 of 10 Patient Name: Date of Service: ADAYAH, AROCHO 11/01/2023 8:15 A M Medical Record Number: [id number] Patient Account Number: [id number] Date of Birth/Sex: Treating RN: August 17, 1956 (68 y.o. [name] Primary Care Kassadee Carawan: MCDIA RMID, TO DD Other Clinician: Referring Mirian Casco: Treating Nahjae Hoeg/Extender: RO BSO N, MICHA EL G MCDIA RMID, TO DD Weeks in Treatment: 0 Compression Therapy Performed for Wound Assessment: Wound #3 Left,Lateral Lower Leg Performed By: Clinician Vaughn Mora, RN Compression Type: Double Layer Post Procedure Diagnosis Same as Pre-procedure Electronic Signature(s) Signed: 11/01/2023 3:54:15 PM By: Vaughn [name] Entered By: Vaughn [name] on 11/01/2023 09:07:12 -------------------------------------------------------------------------------- Encounter Discharge Information Details Patient Name: Date of Service: [name], Hannah [name]. 11/01/2023 8:15 A M Medical Record Number: [id number] Patient Account Number: [id number] Date of Birth/Sex: Treating RN: 1956/03/01 (68 y.o. [name] Primary Care Keyshawn Hellwig: MCDIA RMID, TO DD Other Clinician: Referring Sina Sumpter: Treating Shakema Surita/Extender: RO BSO N, MICHA EL G MCDIA RMID, TO DD Weeks in Treatment: 0 Encounter Discharge Information Items Discharge Condition: Stable Ambulatory Status: Ambulatory Discharge Destination: Home Transportation: Private Auto Accompanied By: self Schedule Follow-up Appointment: Yes Clinical Summary of Care: Electronic  Signature(s) Signed: 11/01/2023 2:42:57 PM By: Vaughn [name] Entered By: Vaughn [name] on 11/01/2023 14:42:57 -------------------------------------------------------------------------------- Lower Extremity Assessment Details Patient Name: Date of Service: VERNADETTE, STUTSMAN 11/01/2023 8:15 A M Medical Record Number: [id number] Patient Account Number: [id number] Date of Birth/Sex: Treating RN: [birth year] (68 y.o. [name] Primary Care Yacoub Diltz: MCDIA RMID, TO DD Other Clinician: Referring Milon Dethloff:  Treating Rina Adney/Extender: CLAUDIE ARVELLA SAILOR, MICHA EL G MCDIA RMID, TO DD Shadd, [name] ([id number]) [id number] Page 5 of 10 Weeks in Treatment: 0 Edema Assessment Assessed: [Left: No] [Right: No] Edema: [Left: Ye] [Right: s] Calf Left: Right: Point of Measurement: 36 cm From Medial Instep 37.2 cm 38.2 cm Ankle Left: Right: Point of Measurement: 11 cm From Medial Instep 24.6 cm 24.8 cm Knee To Floor Left: Right: From Medial Instep 48 cm 48 cm Vascular Assessment Pulses: Dorsalis Pedis Palpable: [Left:Yes] Posterior Tibial Palpable: [Left:Yes] Extremity colors, hair growth, and conditions: Extremity Color: [Left:Normal] Hair Growth on Extremity: [Left:Yes] Temperature of Extremity: [Left:Warm] Capillary Refill: [Left:< 3 seconds] Blood Pressure: Brachial: [Left:116] Ankle: [Left:Dorsalis Pedis: 161 1.39] Toe Nail Assessment Left: Right: Thick: Yes Discolored: No Deformed: No Improper Length and Hygiene: No Electronic Signature(s) Signed: 11/01/2023 3:54:15 PM By: Vaughn [name] Entered By: Vaughn [name] on 11/01/2023 09:21:44 -------------------------------------------------------------------------------- Multi Wound Chart Details Patient Name: Date of Service: [name], Hannah Hannah SAILOR [name]. 11/01/2023 8:15 A M Medical Record Number: [id number] Patient Account Number: [id number] Date of Birth/Sex: Treating RN: 1956/01/21 (68 y.o. [name] Primary Care Jalayia Bagheri: MCDIA RMID, TO DD Other Clinician: Referring Emeterio Balke: Treating Derrico Zhong/Extender: RO BSO N, MICHA EL G MCDIA RMID, TO DD Weeks in Treatment: 0 Vital Signs Height(in): 64 Pulse(bpm): 77 Weight(lbs): 160 Blood Pressure(mmHg): 116/78 [name], [name] ([id number]) [id number] Page 6 of 10 Body Mass Index(BMI): 27.5 Temperature(F): 97.9 Respiratory Rate(breaths/min): 18 [3:Photos:] [N/A:N/A] Left, Lateral Lower Leg N/A N/A Wound Location: Gradually Appeared N/A N/A Wounding Event: Venous Leg Ulcer N/A N/A Primary Etiology: Anemia, Congestive Heart Failure, N/A N/A Comorbid History: Hypertension, Type II Diabetes, History of Burn, Osteoarthritis 07/19/2023 N/A N/A Date Acquired: 0 N/A N/A Weeks of Treatment: Open N/A N/A Wound Status: No N/A N/A Wound Recurrence: 0.5x0.5x0.1 N/A N/A Measurements L x W x D (cm) 0.196 N/A N/A A (cm) : rea 0.02 N/A N/A Volume (cm) : Partial Thickness N/A N/A Classification: Medium N/A N/A Exudate A mount: Serosanguineous N/A N/A Exudate Type: red, brown N/A N/A Exudate Color: Large (67-100%) N/A N/A Granulation A mount: Red N/A N/A Granulation Quality: Small (1-33%) N/A N/A Necrotic A mount: Small (1-33%) N/A N/A Epithelialization: Compression Therapy N/A N/A Procedures Performed: Treatment Notes Electronic Signature(s) Signed: 11/01/2023 4:49:51 PM By: Rufus Sharper MD Entered By: Rufus Sharper on 11/01/2023 09:15:08 -------------------------------------------------------------------------------- Multi-Disciplinary Care Plan Details Patient Name: Date of Service: Hannah Hannah Hannah SAILOR [name]. 11/01/2023 8:15 A M Medical Record Number: [id number] Patient Account Number: [id number] Date of Birth/Sex: Treating RN: 11/03/1955 (68 y.o. [name] Primary Care Nagi Furio: MCDIA RMID, TO DD Other Clinician: Referring Mikko Lewellen: Treating Shye Doty/Extender: RO BSO N, MICHA EL  G MCDIA RMID, TO DD Weeks in Treatment: 0 Active Inactive Venous Leg Ulcer Nursing Diagnoses: Actual venous Insuffiency (use after diagnosis is confirmed) Knowledge deficit related to disease process and management Hild, [name] ([id number]) [id number] Page 7 of 10 Potential for venous Insuffiency (use before diagnosis confirmed) Goals: Patient will maintain optimal edema control Date Initiated: 11/01/2023 Target Resolution Date: 01/24/2024 Goal Status: Active Patient/caregiver will verbalize understanding of disease process and disease management Date Initiated: 11/01/2023 Date Inactivated: 11/01/2023 Target Resolution Date: 11/01/2023 Goal Status: Met Verify adequate tissue perfusion prior to therapeutic compression application Date Initiated: 11/01/2023 Date Inactivated: 11/01/2023 Target Resolution Date: 11/01/2023 Goal Status: Met Interventions: Assess peripheral edema status every visit. Compression as ordered Provide education on venous insufficiency Notes: Wound/Skin Impairment Nursing Diagnoses: Impaired tissue integrity Knowledge deficit related to ulceration/compromised skin  integrity Goals: Ulcer/skin breakdown will have a volume reduction of 30% by week 4 Date Initiated: 11/01/2023 Target Resolution Date: 11/29/2023 Goal Status: Active Ulcer/skin breakdown will have a volume reduction of 50% by week 8 Date Initiated: 11/01/2023 Target Resolution Date: 12/27/2023 Goal Status: Active Ulcer/skin breakdown will have a volume reduction of 80% by week 12 Date Initiated: 11/01/2023 Target Resolution Date: 01/24/2024 Goal Status: Active Ulcer/skin breakdown will heal within 14 weeks Date Initiated: 11/01/2023 Target Resolution Date: 02/07/2024 Goal Status: Active Interventions: Assess patient/caregiver ability to obtain necessary supplies Assess patient/caregiver ability to perform ulcer/skin care regimen upon admission and as  needed Assess ulceration(s) every visit Provide education on ulcer and skin care Notes: Electronic Signature(s) Signed: 11/01/2023 2:42:08 PM By: Vaughn [name] Entered By: Vaughn [name] on 11/01/2023 14:42:07 -------------------------------------------------------------------------------- Pain Assessment Details Patient Name: Date of Service: [name], [name] 11/01/2023 8:15 A M Medical Record Number: [id number] Patient Account Number: [id number] Date of Birth/Sex: Treating RN: December 16, 1955 (68 y.o. [name] Primary Care Nazifa Trinka: MCDIA RMID, TO DD Other Clinician: Referring Giannis Corpuz: Treating Adi Seales/Extender: RO BSO N, MICHA EL G MCDIA RMID, TO DD Weeks in Treatment: [address] [name] ([id number]) [id number] Page 8 of 10 Location of Pain Severity and Description of Pain Patient Has Paino Yes Site Locations Rate the pain. Current Pain Level: 2 Pain Management and Medication Current Pain Management: Notes left ankle Electronic Signature(s) Signed: 11/01/2023 3:54:15 PM By: Vaughn [name] Entered By: Vaughn [name] on 11/01/2023 08:32:58 -------------------------------------------------------------------------------- Patient/Caregiver Education Details Patient Name: Date of Service: [name], [name] 12/31/2024andnbsp8:15 A M Medical Record Number: [id number] Patient Account Number: [id number] Date of Birth/Gender: Treating RN: 03-16-1956 (68 y.o. [name] Primary Care Physician: MCDIA RMID, TO DD Other Clinician: Referring Physician: Treating Physician/Extender: RO BSO N, MICHA EL G MCDIA RMID, TO DD Weeks in Treatment: 0 Education Assessment Education Provided To: Patient Education Topics Provided Venous: Handouts: Controlling Swelling with Compression Stockings Methods: Explain/Verbal Responses: State content correctly Wound/Skin Impairment: Handouts: Caring for Your Ulcer Methods:  Explain/Verbal Responses: State content correctly [place], [name] ([id number]) [id number] Page 9 of 10 Electronic Signature(s) Signed: 11/01/2023 3:54:15 PM By: Vaughn [name] Entered By: Vaughn [name] on 11/01/2023 14:42:22 -------------------------------------------------------------------------------- Wound Assessment Details Patient Name: Date of Service: [name], [name] 11/01/2023 8:15 A M Medical Record Number: [id number] Patient Account Number: [id number] Date of Birth/Sex: Treating RN: November 27, 1955 (68 y.o. [name] Primary Care Matthias Bogus: MCDIA RMID, TO DD Other Clinician: Referring Ramisa Duman: Treating Ilisa Hayworth/Extender: RO BSO N, MICHA EL G MCDIA RMID, TO DD Weeks in Treatment: 0 Wound Status Wound Number: 3 Primary Venous Leg Ulcer Etiology: Wound Location: Left, Lateral Lower Leg Wound Open Wounding Event: Gradually Appeared Status: Date Acquired: 07/19/2023 Comorbid Anemia, Congestive Heart Failure, Hypertension, Type II Weeks Of Treatment: 0 History: Diabetes, History of Burn, Osteoarthritis Clustered Wound: No Photos Wound Measurements Length: (cm) 0.5 Width: (cm) 0.5 Depth: (cm) 0.1 Area: (cm) 0.196 Volume: (cm) 0.02 % Reduction in Area: % Reduction in Volume: Epithelialization: Small (1-33%) Tunneling: No Undermining: No Wound Description Classification: Partial Thickness Exudate Amount: Medium Exudate Type: Serosanguineous Exudate Color: red, brown Foul Odor After Cleansing: No Slough/Fibrino Yes Wound Bed Granulation Amount: Large (67-100%) Granulation Quality: Red Necrotic Amount: Small (1-33%) Necrotic Quality: Adherent Slough Treatment Notes Wound #3 (Lower Leg) Wound Laterality: Left, Lateral Cleanser Motta, [name] ([id number]) [id number] Page 10 of 10 Soap and Water Discharge Instruction: Gently cleanse wound with antibacterial soap, rinse and pat dry prior  to dressing  wounds Wound Cleanser Discharge Instruction: Wash your hands with soap and water. Remove old dressing, discard into plastic bag and place into trash. Cleanse the wound with Wound Cleanser prior to applying a clean dressing using gauze sponges, not tissues or cotton balls. Do not scrub or use excessive force. Pat dry using gauze sponges, not tissue or cotton balls. Peri-Wound Care Topical Primary Dressing Hydrofera Blue Ready Transfer Foam, 2.5x2.5 (in/in) Discharge Instruction: Apply Hydrofera Blue Ready to wound bed as directed Secondary Dressing ABD Pad 5x9 (in/in) Discharge Instruction: Cover with ABD pad Secured With Compression Wrap Urgo K2 Lite, two layer compression system, regular Compression Stockings Add-Ons Electronic Signature(s) Signed: 11/01/2023 3:54:15 PM By: Vaughn [name] Entered By: Vaughn [name] on 11/01/2023 08:51:13 -------------------------------------------------------------------------------- Vitals Details Patient Name: Date of Service: [place], Hannah Hannah LOISE CHRISTELLA. 11/01/2023 8:15 A M Medical Record Number: [id number] Patient Account Number: [id number] Date of Birth/Sex: Treating RN: [birth year] (68 y.o. [name] Primary Care Lajean Boese: MCDIA RMID, TO DD Other Clinician: Referring Tovah Slavick: Treating Debera Sterba/Extender: RO BSO N, MICHA EL G MCDIA RMID, TO DD Weeks in Treatment: 0 Vital Signs Time Taken: 08:33 Temperature (F): 97.9 Height (in): 64 Pulse (bpm): 77 Source: Stated Respiratory Rate (breaths/min): 18 Weight (lbs): 160 Blood Pressure (mmHg): 116/78 Source: Stated Reference Range: 80 - 120 mg / dl Body Mass Index (BMI): 27.5 Electronic Signature(s) Signed: 11/01/2023 3:54:15 PM By: Vaughn [name] Entered By: Vaughn [name] on 11/01/2023 08:34:37

## 2023-11-02 NOTE — Progress Notes (Signed)
 [address] [name] ([id number]) [id number] Page 1 of 10 Visit Report for 11/01/2023 Chief Complaint Document Details Patient Name: Date of Service: [name], [name] 11/01/2023 8:15 A M Medical Record Number: [id number] Patient Account Number: [id number] Date of Birth/Sex: Treating RN: [birth year] (68 y.o. [name] Vaughn Mora Primary Care Provider: MCDIA RMID, TO DD Other Clinician: Referring Provider: Treating Provider/Extender: RO BSO N, MICHA EL G MCDIA RMID, TO DD Weeks in Treatment: 0 Information Obtained from: Patient Chief Complaint Patient presents to the wound care center for a consult due non healing wound the right lower extremity which she's had for about a month 11/01/2023; patient is here for review of a small wound on her left lateral lower leg Electronic Signature(s) Signed: 11/01/2023 4:49:51 PM By: Rufus Sharper MD Entered By: Rufus Sharper on 11/01/2023 09:15:32 -------------------------------------------------------------------------------- HPI Details Patient Name: Date of Service: [name], [name]. 11/01/2023 8:15 A M Medical Record Number: [id number] Patient Account Number: [id number] Date of Birth/Sex: Treating RN: [birth year] (68 y.o. [name] Vaughn Mora Primary Care Provider: MCDIA RMID, TO DD Other Clinician: Referring Provider: Treating Provider/Extender: RO BSO N, MICHA EL G MCDIA RMID, TO DD Weeks in Treatment: 0 History of Present Illness Location: right lower extremity Quality: Patient reports experiencing a sharp pain to affected area(s). Severity: Patient states wound are getting worse. Duration: Patient has had the wound for > 1 month prior to seeking treatment at the wound center Timing: Pain in wound is Intermittent (comes and goes Context: The wound occurred when the patient had a painful swelling which then started discharging pus and may have been an insect bite Modifying Factors: Other treatment(s) tried  include:local antibiotic ointment and oral antibiotics ssociated Signs and Symptoms: Patient reports having increase discharge. A HPI Description: 68 year-old female female who presented to her family medical doctor with a painful ulcer on the right leg which she has had for about 3 weeks. she was not sure whether she had a insect bite and was initially prescribed antihistamines. Down the line she was also started on doxycycline . She continues to drain pus at the present time. Her past medical history is significant for hypertension, venous insufficiency, superficial thrombophlebitis, diverticulosis, osteoarthritis of both knees, status post gastric bypass, iron  deficiency anemia,at his post laparoscopic subtotal colectomy for pancolonic diverticulosis, lumbar back pain. She had a venous duplex study done on 02/05/2016 which showed no DVT SVT or incompetence bilaterally. , 07/09/2016 -- continues to have a lot of pain in the right lateral lower leg where the ulcers are getting more superficial but larger. Arterial studies are still pending PAISLEE, SZATKOWSKI ([id number]) [id number] Page 2 of 10 and at this stage I'm concerned about pyoderma gangrenosum and will get biopsies of the wound edge. 07/15/2016 -- -- surgical pathology report -- DIAGNOSIS: A. SKIN, RIGHT LOWER LEG, BIOPSY: - ULCER, SEE COMMENT - VASCULAR CHANGES CONSISTENT WITH CHRONIC VENOUS STASIS. - NEGATIVE FOR MALIGNANCY. . Comment: The biopsies are from the edge of an ulcer. The dermis contains clustered capillary proliferations, hemosiderin-laden macrophages, and fibrosis. Infiltrates of plasma cells, lymphocytes, and eosinophils are present, but neutrophils are sparse. There are focal areas of subepidermal edema, hemorrhage, and necrosis, but no definite vasculitis is observed. Granulomatous inflammation is absent. There is no evidence of a neoplasm. The histologic features are not specific, but suggest a  component of venous stasis. Characteristic changes of pyoderma gangrenosum, a clinical diagnosis of exclusion, are not identified. Clinical correlation is recommended. She was seen by Dr. ONEIDA early on  07/14/16 - did that she had a venous duplex study in the past but did not have a reflux study. She underwent a arterial study odd at his office the day before and these were totally normal. He did a ultrasound in the office which showed enlargement and reflux throughout its course of the saphenous vein. Venous system could not be seen. His impression was she clearly has severe venous hypertension with at least a major component of this being related to a saphenous vein reflux. READMISSION 11/01/2023 Patient is now a 68 year old woman with type 2 diabetes and a history of chronic venous insufficiency. She was here in clinic in 2017 with a right leg ulcer felt to be secondary to an infection. She had venous duplex workup at that time that did not show any reflux. Her arterial studies were apparently normal. Biopsy was consistent with chronic venous stasis however She has had a difficult time this year with an abscess in her right knee that eventually required plastic surgery. She also had a recent wound on the right ankle but that is closed. I think the current wound we are seeing was started sometime in late August. She said this started as a trauma on a bed frame. She saw her primary doctor on 09/06/2023 because of this ulcer. She has been using Neosporin and a Band-Aid. Currently she has a small superficial ulcer measuring 0.5 x 0.5 cm Past medical history includes well-controlled type 2 diabetes with a recent hemoglobin A1c of 5.6, chronic venous hypertension and PAD. ABI in our clinic was 1.39 on the left. She has easily palpable pulses however. I do not believe she has a significant arterial issue certainly not enough to inhibit healing of this wound Electronic Signature(s) Signed: 11/01/2023  4:49:51 PM By: Rufus Sharper MD Entered By: Rufus Sharper on 11/01/2023 09:20:10 -------------------------------------------------------------------------------- Physical Exam Details Patient Name: Date of Service: [name], [name]. 11/01/2023 8:15 A M Medical Record Number: [id number] Patient Account Number: [id number] Date of Birth/Sex: Treating RN: 10/08/1956 (68 y.o. [name] Vaughn Mora Primary Care Provider: MCDIA RMID, TO DD Other Clinician: Referring Provider: Treating Provider/Extender: RO BSO N, MICHA EL G MCDIA RMID, TO DD Weeks in Treatment: 0 Constitutional Sitting or standing Blood Pressure is within target range for patient.. Pulse regular and within target range for patient.SABRA Respirations regular, non-labored and within target range.. Temperature is normal and within the target range for the patient.SABRA appears in no distress. Cardiovascular Pedal pulses are palpableOn the left.. Chronic hemosiderin deposition some swelling in the left leg but not too bad. No evidence of cellulitis or DVT. Notes Wound exam; the areas on the left lateral lower leg small wound that looks clean well granulated rim of epithelialization no mechanical debridement is necessary. She has scars from previous wounds on her bilateral lateral lower legs which she said were about 5 years ago Electronic Signature(s) Signed: 11/01/2023 4:49:51 PM By: Rufus Sharper MD Entered By: Rufus Sharper on 11/01/2023 09:18:50 Houchins, [name] ([id number]) [id number] Page 3 of 10 -------------------------------------------------------------------------------- Physician Orders Details Patient Name: Date of Service: [name], [name] 11/01/2023 8:15 A M Medical Record Number: [id number] Patient Account Number: [id number] Date of Birth/Sex: Treating RN: 01/05/1956 (68 y.o. [name] Vaughn Mora Primary Care Provider: MCDIA RMID, TO DD Other Clinician: Referring Provider: Treating  Provider/Extender: RO BSO N, MICHA EL G MCDIA RMID, TO DD Weeks in Treatment: 0 The following information was scribed by: Vaughn Mora The information was scribed for: RUFUS SHARPER MATSU Verbal /  Phone Orders: No Diagnosis Coding Follow-up Appointments Return Appointment in 1 week. Bathing/ Shower/ Hygiene May shower with wound dressing protected with water repellent cover or cast protector. No tub bath. Anesthetic (Use 'Patient Medications' Section for Anesthetic Order Entry) Lidocaine  applied to wound bed Edema Control - Orders / Instructions Elevate, Exercise Daily and A void Standing for Long Periods of Time. Elevate legs to the level of the heart and pump ankles as often as possible Elevate leg(s) parallel to the floor when sitting. DO YOUR BEST to sleep in the bed at night. DO NOT sleep in your recliner. Long hours of sitting in a recliner leads to swelling of the legs and/or potential wounds on your backside. Wound Treatment Wound #3 - Lower Leg Wound Laterality: Left, Lateral Cleanser: Soap and Water 1 x Per Week/15 Days Discharge Instructions: Gently cleanse wound with antibacterial soap, rinse and pat dry prior to dressing wounds Cleanser: Wound Cleanser 1 x Per Week/15 Days Discharge Instructions: Wash your hands with soap and water. Remove old dressing, discard into plastic bag and place into trash. Cleanse the wound with Wound Cleanser prior to applying a clean dressing using gauze sponges, not tissues or cotton balls. Do not scrub or use excessive force. Pat dry using gauze sponges, not tissue or cotton balls. Prim Dressing: Hydrofera Blue Ready Transfer Foam, 2.5x2.5 (in/in) 1 x Per Week/15 Days ary Discharge Instructions: Apply Hydrofera Blue Ready to wound bed as directed Secondary Dressing: ABD Pad 5x9 (in/in) 1 x Per Week/15 Days Discharge Instructions: Cover with ABD pad Compression Wrap: Urgo K2 Lite, two layer compression system, regular 1 x Per Week/15  Days Electronic Signature(s) Signed: 11/01/2023 3:54:15 PM By: Vaughn Mora Signed: 11/01/2023 4:49:51 PM By: Rufus Sharper MD Entered By: Vaughn Mora on 11/01/2023 09:20:26 [name], [name] ([id number]) [id number] Page 4 of 10 -------------------------------------------------------------------------------- Problem List Details Patient Name: Date of Service: [name], [name] 11/01/2023 8:15 A M Medical Record Number: [id number] Patient Account Number: [id number] Date of Birth/Sex: Treating RN: 1956-08-18 (68 y.o. [name] Vaughn Mora Primary Care Provider: MCDIA RMID, TO DD Other Clinician: Referring Provider: Treating Provider/Extender: RO BSO N, MICHA EL G MCDIA RMID, TO DD Weeks in Treatment: 0 Active Problems ICD-10 Encounter Code Description Active Date MDM Diagnosis I87.332 Chronic venous hypertension (idiopathic) with ulcer and inflammation of left 11/01/2023 No Yes lower extremity L97.821 Non-pressure chronic ulcer of other part of left lower leg limited to breakdown 11/01/2023 No Yes of skin E11.622 Type 2 diabetes mellitus with other skin ulcer 11/01/2023 No Yes Inactive Problems Resolved Problems Electronic Signature(s) Signed: 11/01/2023 4:49:51 PM By: Rufus Sharper MD Entered By: Rufus Sharper on 11/01/2023 09:14:50 -------------------------------------------------------------------------------- Progress Note Details Patient Name: Date of Service: [name], [name]. 11/01/2023 8:15 A M Medical Record Number: [id number] Patient Account Number: [id number] Date of Birth/Sex: Treating RN: 07/14/1956 (68 y.o. [name] Vaughn Mora Primary Care Provider: MCDIA RMID, TO DD Other Clinician: Referring Provider: Treating Provider/Extender: RO BSO N, MICHA EL G MCDIA RMID, TO DD Weeks in Treatment: 0 Subjective [name], [name] ([id number]) [id number] Page 5 of 10 Chief Complaint Information obtained from  Patient Patient presents to the wound care center for a consult due non healing wound the right lower extremity which she's had for about a month 11/01/2023; patient is here for review of a small wound on her left lateral lower leg History of Present Illness (HPI) The following HPI elements were documented for the patient's wound: Location: right lower extremity Quality: Patient reports  experiencing a sharp pain to affected area(s). Severity: Patient states wound are getting worse. Duration: Patient has had the wound for > 1 month prior to seeking treatment at the wound center Timing: Pain in wound is Intermittent (comes and goes Context: The wound occurred when the patient had a painful swelling which then started discharging pus and may have been an insect bite Modifying Factors: Other treatment(s) tried include:local antibiotic ointment and oral antibiotics Associated Signs and Symptoms: Patient reports having increase discharge. 68 year-old female who presented to her family medical doctor with a painful ulcer on the right leg which she has had for about 3 weeks. she was not sure whether she had a insect bite and was initially prescribed antihistamines. Down the line she was also started on doxycycline . She continues to drain pus at the present time. Her past medical history is significant for hypertension, venous insufficiency, superficial thrombophlebitis, diverticulosis, osteoarthritis of both knees, status post gastric bypass, iron  deficiency anemia,at his post laparoscopic subtotal colectomy for pancolonic diverticulosis, lumbar back pain. She had a venous duplex study done on 02/05/2016 which showed no DVT SVT or incompetence bilaterally. , 07/09/2016 -- continues to have a lot of pain in the right lateral lower leg where the ulcers are getting more superficial but larger. Arterial studies are still pending and at this stage I'm concerned about pyoderma gangrenosum and will get  biopsies of the wound edge. 07/15/2016 -- -- surgical pathology report -- DIAGNOSIS: A. SKIN, RIGHT LOWER LEG, BIOPSY: - ULCER, SEE COMMENT - VASCULAR CHANGES CONSISTENT WITH CHRONIC VENOUS STASIS. - NEGATIVE FOR MALIGNANCY. . Comment: The biopsies are from the edge of an ulcer. The dermis contains clustered capillary proliferations, hemosiderin-laden macrophages, and fibrosis. Infiltrates of plasma cells, lymphocytes, and eosinophils are present, but neutrophils are sparse. There are focal areas of subepidermal edema, hemorrhage, and necrosis, but no definite vasculitis is observed. Granulomatous inflammation is absent. There is no evidence of a neoplasm. The histologic features are not specific, but suggest a component of venous stasis. Characteristic changes of pyoderma gangrenosum, a clinical diagnosis of exclusion, are not identified. Clinical correlation is recommended. She was seen by Dr. ONEIDA early on 07/14/16 - did that she had a venous duplex study in the past but did not have a reflux study. She underwent a arterial study odd at his office the day before and these were totally normal. He did a ultrasound in the office which showed enlargement and reflux throughout its course of the saphenous vein. Venous system could not be seen. His impression was she clearly has severe venous hypertension with at least a major component of this being related to a saphenous vein reflux. READMISSION 11/01/2023 Patient is now a 68 year old woman with type 2 diabetes and a history of chronic venous insufficiency. She was here in clinic in 2017 with a right leg ulcer felt to be secondary to an infection. She had venous duplex workup at that time that did not show any reflux. Her arterial studies were apparently normal. Biopsy was consistent with chronic venous stasis however She has had a difficult time this year with an abscess in her right knee that eventually required plastic surgery. She also had a recent  wound on the right ankle but that is closed. I think the current wound we are seeing was started sometime in late August. She said this started as a trauma on a bed frame. She saw her primary doctor on 09/06/2023 because of this ulcer. She has been using Neosporin  and a Band-Aid. Currently she has a small superficial ulcer measuring 0.5 x 0.5 cm Past medical history includes well-controlled type 2 diabetes with a recent hemoglobin A1c of 5.6, chronic venous hypertension and PAD. ABI in our clinic was 1.39 on the left. She has easily palpable pulses however. I do not believe she has a significant arterial issue certainly not enough to inhibit healing of this wound Patient History Information obtained from Patient, Chart. Allergies aspirin, pantoprazole , NSAIDS (Non-Steroidal Anti-Inflammatory Drug), tolmetin Family History Cancer - Father,Siblings, Diabetes - Mother,Siblings, Hypertension - Father,Mother,Siblings, Seizures - Siblings, Stroke - Siblings, No family history of Heart Disease, Hereditary Spherocytosis, Kidney Disease, Lung Disease, Thyroid  Problems, Tuberculosis. Social History Former smoker, Marital Status - Single, Alcohol Use - Rarely, Drug Use - No History, Caffeine Use - Daily - coffee. Medical History Eyes Denies history of Cataracts, Glaucoma, Optic Neuritis Ear/Nose/Mouth/Throat Denies history of Chronic sinus problems/congestion, Middle ear problems Hematologic/Lymphatic Patient has history of Anemia Respiratory Denies history of Aspiration, Asthma, Pneumothorax, Sleep Apnea, Tuberculosis Cardiovascular Patient has history of Congestive Heart Failure, Hypertension Gastrointestinal Denies history of Cirrhosis , Colitis, Crohns, Hepatitis A, Hepatitis B, Hepatitis C Endocrine Patient has history of Type II Diabetes - 10 years Denies history of Type I Diabetes Genitourinary Denies history of End Stage Renal Disease [name], [name] ([id number])  [id number] Page 6 of 10 Immunological Denies history of Lupus Erythematosus, Raynauds, Scleroderma Integumentary (Skin) Patient has history of History of Burn Denies history of History of pressure wounds Musculoskeletal Patient has history of Osteoarthritis Denies history of Gout, Rheumatoid Arthritis, Osteomyelitis Neurologic Denies history of Dementia, Neuropathy, Quadriplegia, Paraplegia, Seizure Disorder Oncologic Denies history of Received Chemotherapy, Received Radiation Psychiatric Denies history of Anorexia/bulimia, Confinement Anxiety Patient is treated with Controlled Diet. Blood sugar is not tested. Medical A Surgical History Notes nd Cardiovascular venous insufficiency Gastrointestinal GERD Musculoskeletal bilat knee replacement Review of Systems (ROS) Constitutional Symptoms (General Health) Denies complaints or symptoms of Fatigue, Fever, Chills, Marked Weight Change. Eyes Denies complaints or symptoms of Dry Eyes, Vision Changes, Glasses / Contacts. Ear/Nose/Mouth/Throat Denies complaints or symptoms of Difficult clearing ears, Sinusitis. Respiratory Denies complaints or symptoms of Chronic or frequent coughs, Shortness of Breath. Gastrointestinal hx gastric bypass surgery Immunological Denies complaints or symptoms of Hives, Itching. Integumentary (Skin) Complains or has symptoms of Wounds. Neurologic Denies complaints or symptoms of Numbness/parasthesias, Focal/Weakness, hx stroke Psychiatric Denies complaints or symptoms of Anxiety, Claustrophobia. Objective Constitutional Sitting or standing Blood Pressure is within target range for patient.. Pulse regular and within target range for patient.SABRA Respirations regular, non-labored and within target range.. Temperature is normal and within the target range for the patient.SABRA appears in no distress. Vitals Time Taken: 8:33 AM, Height: 64 in, Source: Stated, Weight: 160 lbs,  Source: Stated, BMI: 27.5, Temperature: 97.9 F, Pulse: 77 bpm, Respiratory Rate: 18 breaths/min, Blood Pressure: 116/78 mmHg. Cardiovascular Pedal pulses are palpableOn the left.. Chronic hemosiderin deposition some swelling in the left leg but not too bad. No evidence of cellulitis or DVT. General Notes: Wound exam; the areas on the left lateral lower leg small wound that looks clean well granulated rim of epithelialization no mechanical debridement is necessary. She has scars from previous wounds on her bilateral lateral lower legs which she said were about 5 years ago Integumentary (Hair, Skin) Wound #3 status is Open. Original cause of wound was Gradually Appeared. The date acquired was: 07/19/2023. The wound is located on the Left,Lateral Lower Leg. The wound measures 0.5cm length x 0.5cm width  x 0.1cm depth; 0.196cm^2 area and 0.02cm^3 volume. There is no tunneling or undermining noted. There is a medium amount of serosanguineous drainage noted. There is large (67-100%) red granulation within the wound bed. There is a small (1-33%) amount of necrotic tissue within the wound bed including Adherent Slough. Assessment Active Problems ICD-10 Chronic venous hypertension (idiopathic) with ulcer and inflammation of left lower extremity Non-pressure chronic ulcer of other part of left lower leg limited to breakdown of skin Type 2 diabetes mellitus with other skin ulcer [name], [name] ([id number]) [id number] Page 7 of 10 Procedures Wound #3 Pre-procedure diagnosis of Wound #3 is a Venous Leg Ulcer located on the Left,Lateral Lower Leg . There was a Double Layer Compression Therapy Procedure by Gordon, Caitlin, RN. Post procedure Diagnosis Wound #3: Same as Pre-Procedure Plan 1. We dressed the wound with Hydrofera Blue, ABDs under and Urgo K2 lite 2. I do not believe she has a significant arterial issue at this point I did not order arterial or venous studies 3. She  has not been wearing stockings I told her I think that will be necessary going forward we will give her her measurements to order these through elastic therapy in [place]. Electronic Signature(s) Signed: 11/01/2023 4:49:51 PM By: Rufus Sharper MD Entered By: Rufus Sharper on 11/01/2023 09:21:04 -------------------------------------------------------------------------------- ROS/PFSH Details Patient Name: Date of Service: [name], [name]. 11/01/2023 8:15 A M Medical Record Number: [id number] Patient Account Number: [id number] Date of Birth/Sex: Treating RN: 1956-07-04 (68 y.o. [name] Vaughn Mora Primary Care Provider: MCDIA RMID, TO DD Other Clinician: Referring Provider: Treating Provider/Extender: RO BSO N, MICHA EL G MCDIA RMID, TO DD Weeks in Treatment: 0 Information Obtained From Patient Chart Constitutional Symptoms (General Health) Complaints and Symptoms: Negative for: Fatigue; Fever; Chills; Marked Weight Change Eyes Complaints and Symptoms: Negative for: Dry Eyes; Vision Changes; Glasses / Contacts Medical History: Negative for: Cataracts; Glaucoma; Optic Neuritis Ear/Nose/Mouth/Throat Complaints and Symptoms: Negative for: Difficult clearing ears; Sinusitis Medical History: Negative for: Chronic sinus problems/congestion; Middle ear problems Respiratory Complaints and Symptoms: Negative for: Chronic or frequent coughs; Shortness [address] [name] ([id number]) [id number] Page 8 of 10 Medical History: Negative for: Aspiration; Asthma; Pneumothorax; Sleep Apnea; Tuberculosis Immunological Complaints and Symptoms: Negative for: Hives; Itching Medical History: Negative for: Lupus Erythematosus; Raynauds; Scleroderma Integumentary (Skin) Complaints and Symptoms: Positive for: Wounds Medical History: Positive for: History of Burn Negative for: History of pressure wounds Neurologic Complaints and Symptoms: Negative for:  Numbness/parasthesias; Focal/Weakness Review of System Notes: hx stroke Medical History: Negative for: Dementia; Neuropathy; Quadriplegia; Paraplegia; Seizure Disorder Psychiatric Complaints and Symptoms: Negative for: Anxiety; Claustrophobia Medical History: Negative for: Anorexia/bulimia; Confinement Anxiety Hematologic/Lymphatic Medical History: Positive for: Anemia Cardiovascular Medical History: Positive for: Congestive Heart Failure; Hypertension Past Medical History Notes: venous insufficiency Gastrointestinal Complaints and Symptoms: Review of System Notes: hx gastric bypass surgery Medical History: Negative for: Cirrhosis ; Colitis; Crohns; Hepatitis A; Hepatitis B; Hepatitis C Past Medical History Notes: GERD Endocrine Medical History: Positive for: Type II Diabetes - 10 years Negative for: Type I Diabetes Time with diabetes: 10 years Treated with: Diet Blood sugar tested every day: No Genitourinary Medical History: Negative for: End Stage Renal Disease Musculoskeletal Medical History: Positive for: Osteoarthritis Negative for: Gout; Rheumatoid Arthritis; Osteomyelitis Past Medical History NotesKEYUNNA, COCO ([id number]) [id number] Page 9 of 10 bilat knee replacement Oncologic Medical History: Negative for: Received Chemotherapy; Received Radiation Immunizations Pneumococcal Vaccine: Received Pneumococcal Vaccination: Yes Received Pneumococcal Vaccination On or After 60th  Birthday: Yes Implantable Devices None Family and Social History Cancer: Yes - Father,Siblings; Diabetes: Yes - Mother,Siblings; Heart Disease: No; Hereditary Spherocytosis: No; Hypertension: Yes - Father,Mother,Siblings; Kidney Disease: No; Lung Disease: No; Seizures: Yes - Siblings; Stroke: Yes - Siblings; Thyroid  Problems: No; Tuberculosis: No; Former smoker; Marital Status - Single; Alcohol Use: Rarely; Drug Use: No History; Caffeine Use: Daily -  coffee Social Determinants of Health (SDOH) 1. In the past 2 months, did you or others you live with eat smaller meals or skip meals because you didn't have money for foodo : No 2. Are you homeless or worried that you might be in the futureo : No 3. Do you have trouble paying for your utilities (gas, electricity, phone)o : No 4. Do you have trouble finding or paying for a rideo : No 5. Do you need daycare, or better daycare, for your kidso : No 6. Are you unemployed or without regular incomeo : No 7. Do you need help finding a better jobo : No 8. Do you need help getting more educationo : No 9. Are you concerned about someone in your home using drugs or alcoholo : No 10. Do you feel unsafe in your daily lifeo : No 11. Is anyone in your home threatening or abusing youo : No 12. Do you lack quality relationships that make you feel valued and supportedo : No 13. Do you need help getting cultural information in a language you understando : No 14. Do you need help getting internet accesso : No Advanced Directives and Instructions Advanced Directives: No Patient wants information on Advanced Directives: No Living Will: No Electronic Signature(s) Signed: 11/01/2023 3:54:15 PM By: Vaughn Mora Signed: 11/01/2023 4:49:51 PM By: Rufus Sharper MD Entered By: Vaughn Mora on 11/01/2023 08:38:51 -------------------------------------------------------------------------------- SuperBill Details Patient Name: Date of Service: Litzenberger, [name]. 11/01/2023 Medical Record Number: [id number] Patient Account Number: [id number] Date of Birth/Sex: Treating RN: [birth year] (68 y.o. 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 with other skin ulcer Facility Procedures : CPT4 Code: 63899838 Description: (Facility Use Only) [id number] - APPLY MULTLAY COMPRS LWR LT LEG Modifier: Quantity: 1 Physician Procedures : CPT4 Code Description Modifier [id number] WC PHYS LEVEL 3 NEW PT ICD-10 Diagnosis Description I87.332 Chronic venous hypertension (idiopathic) with ulcer and inflammation of left lower extremity 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

## 2023-11-09 ENCOUNTER — Encounter: Payer: Medicare HMO | Attending: Physician Assistant | Admitting: Physician Assistant

## 2023-11-09 DIAGNOSIS — I87332 Chronic venous hypertension (idiopathic) with ulcer and inflammation of left lower extremity: Secondary | ICD-10-CM | POA: Insufficient documentation

## 2023-11-09 DIAGNOSIS — E11622 Type 2 diabetes mellitus with other skin ulcer: Secondary | ICD-10-CM | POA: Insufficient documentation

## 2023-11-09 DIAGNOSIS — D509 Iron deficiency anemia, unspecified: Secondary | ICD-10-CM | POA: Diagnosis not present

## 2023-11-09 DIAGNOSIS — I872 Venous insufficiency (chronic) (peripheral): Secondary | ICD-10-CM | POA: Insufficient documentation

## 2023-11-09 DIAGNOSIS — I10 Essential (primary) hypertension: Secondary | ICD-10-CM | POA: Insufficient documentation

## 2023-11-09 DIAGNOSIS — Z9884 Bariatric surgery status: Secondary | ICD-10-CM | POA: Insufficient documentation

## 2023-11-09 DIAGNOSIS — K5730 Diverticulosis of large intestine without perforation or abscess without bleeding: Secondary | ICD-10-CM | POA: Diagnosis not present

## 2023-11-09 DIAGNOSIS — L97821 Non-pressure chronic ulcer of other part of left lower leg limited to breakdown of skin: Secondary | ICD-10-CM | POA: Diagnosis not present

## 2023-11-09 DIAGNOSIS — I87302 Chronic venous hypertension (idiopathic) without complications of left lower extremity: Secondary | ICD-10-CM | POA: Diagnosis not present
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
## 2023-11-09 NOTE — Progress Notes (Addendum)
 [address], GLORI [name] ([id number]) [id number] Page 1 of 6 Visit Report for 11/09/2023 Chief Complaint Document Details Patient Name: Date of Service: [name], [name] 11/09/2023 8:30 Hannah M Medical Record Number: [id number] Patient Account Number: [id number] Date of Birth/Sex: Treating RN: 1956/05/27 (68 y.o. [name] Primary Care Provider: MCDIA RMID, TO DD Other Clinician: Referring Provider: Treating Provider/Extender: Bethena Ferraris MCDIA RMID, TO DD Weeks in Treatment: 1 Information Obtained from: Patient Chief Complaint Patient presents to the wound care center for Hannah consult due non healing wound the right lower extremity which she's had for about Hannah month 11/01/2023; patient is here for review of Hannah small wound on her left lateral lower leg Electronic Signature(s) Signed: 11/09/2023 8:38:30 AM By: Bethena Ferraris PA-C Entered By: Bethena Ferraris on 11/09/2023 05:38:30 -------------------------------------------------------------------------------- HPI Details Patient Name: Date of Service: [name], [name]. 11/09/2023 8:30 Hannah M Medical Record Number: [id number] Patient Account Number: [id number] Date of Birth/Sex: Treating RN: [birth year] (68 y.o. [name] Primary Care Provider: MCDIA RMID, TO DD Other Clinician: Referring Provider: Treating Provider/Extender: Bethena Ferraris MCDIA RMID, TO DD Weeks in Treatment: 1 History of Present Illness Location: right lower extremity Quality: Patient reports experiencing Hannah sharp pain to affected area(s). Severity: Patient states wound are getting worse. Duration: Patient has had the wound for > 1 month prior to seeking treatment at the wound center Timing: Pain in wound is Intermittent (comes and goes Context: The wound occurred when the patient had Hannah painful swelling which then started discharging pus and may have been an insect bite Modifying Factors: Other treatment(s) tried include:local antibiotic ointment and oral  antibiotics ssociated Signs and Symptoms: Patient reports having increase discharge. Hannah HPI Description: 68 year-old female who presented to her family medical doctor with Hannah painful ulcer on the right leg which she has had for about 3 weeks. she was not sure whether she had Hannah insect bite and was initially prescribed antihistamines. Down the line she was also started on doxycycline . She continues to drain pus at the present time. Her past medical history is significant for hypertension, venous insufficiency, superficial thrombophlebitis, diverticulosis, osteoarthritis of both knees, status post gastric bypass, iron  deficiency anemia,at his post laparoscopic subtotal colectomy for pancolonic diverticulosis, lumbar back pain. She had Hannah venous duplex study done on 02/05/2016 which showed no DVT SVT or incompetence bilaterally. , 07/09/2016 -- continues to have Hannah lot of pain in the right lateral lower leg where the ulcers are getting more superficial but larger. Arterial studies are still pending [name], [name] ([id number]) [id number] Page 2 of 6 and at this stage I'm concerned about pyoderma gangrenosum and will get biopsies of the wound edge. 07/15/2016 -- -- surgical pathology report -- DIAGNOSIS: Hannah. SKIN, RIGHT LOWER LEG, BIOPSY: - ULCER, SEE COMMENT - VASCULAR CHANGES CONSISTENT WITH CHRONIC VENOUS STASIS. - NEGATIVE FOR MALIGNANCY. . Comment: The biopsies are from the edge of an ulcer. The dermis contains clustered capillary proliferations, hemosiderin-laden macrophages, and fibrosis. Infiltrates of plasma cells, lymphocytes, and eosinophils are present, but neutrophils are sparse. There are focal areas of subepidermal edema, hemorrhage, and necrosis, but no definite vasculitis is observed. Granulomatous inflammation is absent. There is no evidence of Hannah neoplasm. The histologic features are not specific, but suggest Hannah component of venous stasis. Characteristic changes  of pyoderma gangrenosum, Hannah clinical diagnosis of exclusion, are not identified. Clinical correlation is recommended. She was seen by Dr. ONEIDA early on 07/14/16 - did that she had Hannah venous  duplex study in the past but did not have Hannah reflux study. She underwent Hannah arterial study odd at his office the day before and these were totally normal. He did Hannah ultrasound in the office which showed enlargement and reflux throughout its course of the saphenous vein. Venous system could not be seen. His impression was she clearly has severe venous hypertension with at least Hannah major component of this being related to Hannah saphenous vein reflux. READMISSION 11/01/2023 Patient is now Hannah 68 year old woman with type 2 diabetes and Hannah history of chronic venous insufficiency. She was here in clinic in 2017 with Hannah right leg ulcer felt to be secondary to an infection. She had venous duplex workup at that time that did not show any reflux. Her arterial studies were apparently normal. Biopsy was consistent with chronic venous stasis however She has had Hannah difficult time this year with an abscess in her right knee that eventually required plastic surgery. She also had Hannah recent wound on the right ankle but that is closed. I think the current wound we are seeing was started sometime in late August. She said this started as Hannah trauma on Hannah bed frame. She saw her primary doctor on 09/06/2023 because of this ulcer. She has been using Neosporin and Hannah Band-Aid. Currently she has Hannah small superficial ulcer measuring 0.5 x 0.5 cm Past medical history includes well-controlled type 2 diabetes with Hannah recent hemoglobin A1c of 5.6, chronic venous hypertension and PAD. ABI in our clinic was 1.39 on the left. She has easily palpable pulses however. I do not believe she has Hannah significant arterial issue certainly not enough to inhibit healing of this wound 11-09-23 upon evaluation today patient appears to be doing well currently in regard to her wound. This  actually appears to be completely healed based on what I am seeing. I do not see any signs of active infection which is great news. No fevers, chills, nausea, vomiting, or diarrhea. Electronic Signature(s) Signed: 11/09/2023 9:13:36 AM By: Bethena Ferraris PA-C Entered By: Bethena Ferraris on 11/09/2023 06:13:35 -------------------------------------------------------------------------------- Physical Exam Details Patient Name: Date of Service: [place], Hannah RLEA N M. 11/09/2023 8:30 Hannah M Medical Record Number: [id number] Patient Account Number: [id number] Date of Birth/Sex: Treating RN: 1955/11/10 (68 y.o. [name] Primary Care Provider: MCDIA RMID, TO DD Other Clinician: Referring Provider: Treating Provider/Extender: Bethena Ferraris MCDIA RMID, TO DD Weeks in Treatment: 1 Constitutional Well-nourished and well-hydrated in no acute distress. Respiratory normal breathing without difficulty. Psychiatric this patient is able to make decisions and demonstrates good insight into disease process. Alert and Oriented x 3. pleasant and cooperative. Notes Upon inspection patient's wound bed actually showed signs of good granulation and epithelization at this point. Fortunately I do not see any evidence of worsening I believe that the patient is making excellent headway here towards closure which is great news. No fevers, chills, nausea, vomiting, or diarrhea. Electronic Signature(s) Signed: 11/09/2023 9:13:48 AM By: Bethena Ferraris PA-C Entered By: Bethena Ferraris on 11/09/2023 06:13:48 Lavallie, GLORI [name] ([id number]) [id number] Page 3 of 6 -------------------------------------------------------------------------------- Physician Orders Details Patient Name: Date of Service: [name], [name] 11/09/2023 8:30 Hannah M Medical Record Number: [id number] Patient Account Number: [id number] Date of Birth/Sex: Treating RN: 1956/09/28 (68 y.o. [name] Primary Care Provider: MCDIA RMID, TO DD Other  Clinician: Referring Provider: Treating Provider/Extender: Bethena Ferraris MCDIA RMID, TO DD Weeks in Treatment: 1 The following information was scribed by: Claudene [name] The information was scribed for:  Bethena Ferraris Verbal / Phone Orders: No Diagnosis Coding ICD-10 Coding Code Description [phone] Chronic venous hypertension (idiopathic) with ulcer and inflammation of left lower extremity L97.821 Non-pressure chronic ulcer of other part of left lower leg limited to breakdown of skin E11.622 Type 2 diabetes mellitus with other skin ulcer Discharge From [facility] Discharge from Wound Care Center Treatment Complete - May put Hannah and D ointment and band aid on for protection for Hannah week. Wear Tubi grip until compression socks received Call if needed Electronic Signature(s) Signed: 11/09/2023 5:32:36 PM By: Claudene Blossom MSN RN CNS WTA Signed: 11/09/2023 6:16:28 PM By: Bethena Ferraris PA-C Entered By: Claudene [name] on 11/09/2023 06:12:49 -------------------------------------------------------------------------------- Problem List Details Patient Name: Date of Service: [name], [name]. 11/09/2023 8:30 Hannah M Medical Record Number: [id number] Patient Account Number: [id number] Date of Birth/Sex: Treating RN: 1955-11-05 (68 y.o. [name] Primary Care Provider: MCDIA RMID, TO DD Other Clinician: Referring Provider: Treating Provider/Extender: Bethena Ferraris MCDIA RMID, TO DD Weeks in Treatment: 1 Active Problems ICD-10 Encounter Code Description Active Date MDM Diagnosis I87.332 Chronic venous hypertension (idiopathic) with ulcer and inflammation of left 11/01/2023 No Yes lower extremity Credeur, GLORI [name] ([id number]) [id number] Page 4 of 6 [phone] Non-pressure chronic ulcer of other part of left lower leg limited to breakdown 11/01/2023 No Yes of skin E11.622 Type 2 diabetes mellitus with other skin ulcer 11/01/2023 No Yes Inactive Problems Resolved Problems Electronic  Signature(s) Signed: 11/09/2023 8:38:23 AM By: Bethena Ferraris PA-C Entered By: Bethena Ferraris on 11/09/2023 05:38:22 -------------------------------------------------------------------------------- Progress Note Details Patient Name: Date of Service: Mingle, [name]. 11/09/2023 8:30 Hannah M Medical Record Number: [id number] Patient Account Number: [id number] Date of Birth/Sex: Treating RN: [birth year] (68 y.o. [name] Primary Care Provider: MCDIA RMID, TO DD Other Clinician: Referring Provider: Treating Provider/Extender: Bethena Ferraris MCDIA RMID, TO DD Weeks in Treatment: 1 Subjective Chief Complaint Information obtained from Patient Patient presents to the wound care center for Hannah consult due non healing wound the right lower extremity which she's had for about Hannah month 11/01/2023; patient is here for review of Hannah small wound on her left lateral lower leg History of Present Illness (HPI) The following HPI elements were documented for the patient's wound: Location: right lower extremity Quality: Patient reports experiencing Hannah sharp pain to affected area(s). Severity: Patient states wound are getting worse. Duration: Patient has had the wound for > 1 month prior to seeking treatment at the wound center Timing: Pain in wound is Intermittent (comes and goes Context: The wound occurred when the patient had Hannah painful swelling which then started discharging pus and may have been an insect bite Modifying Factors: Other treatment(s) tried include:local antibiotic ointment and oral antibiotics Associated Signs and Symptoms: Patient reports having increase discharge. 68 year-old female who presented to her family medical doctor with Hannah painful ulcer on the right leg which she has had for about 3 weeks. she was not sure whether she had Hannah insect bite and was initially prescribed antihistamines. Down the line she was also started on doxycycline . She continues to drain pus at the present time. Her past medical  history is significant for hypertension, venous insufficiency, superficial thrombophlebitis, diverticulosis, osteoarthritis of both knees, status post gastric bypass, iron  deficiency anemia,at his post laparoscopic subtotal colectomy for pancolonic diverticulosis, lumbar back pain. She had Hannah venous duplex study done on 02/05/2016 which showed no DVT SVT or incompetence bilaterally. , 07/09/2016 -- continues to have Hannah lot of pain  in the right lateral lower leg where the ulcers are getting more superficial but larger. Arterial studies are still pending and at this stage I'm concerned about pyoderma gangrenosum and will get biopsies of the wound edge. 07/15/2016 -- -- surgical pathology report -- DIAGNOSIS: Hannah. SKIN, RIGHT LOWER LEG, BIOPSY: - ULCER, SEE COMMENT - VASCULAR CHANGES CONSISTENT WITH CHRONIC VENOUS STASIS. - NEGATIVE FOR MALIGNANCY. . Comment: The biopsies are from the edge of an ulcer. The dermis contains clustered capillary proliferations, hemosiderin-laden macrophages, and fibrosis. Infiltrates of plasma cells, lymphocytes, and eosinophils are present, but neutrophils are sparse. There are focal areas of subepidermal edema, hemorrhage, and necrosis, but no definite vasculitis is observed. Granulomatous inflammation is absent. There is no evidence of Hannah neoplasm. The histologic features are not specific, but suggest Hannah component of venous stasis. Characteristic changes of pyoderma gangrenosum, Hannah clinical diagnosis of [name], [name] ([id number]) [id number] Page 5 of 6 exclusion, are not identified. Clinical correlation is recommended. She was seen by Dr. ONEIDA early on 07/14/16 - did that she had Hannah venous duplex study in the past but did not have Hannah reflux study. She underwent Hannah arterial study odd at his office the day before and these were totally normal. He did Hannah ultrasound in the office which showed enlargement and reflux throughout its course of the saphenous  vein. Venous system could not be seen. His impression was she clearly has severe venous hypertension with at least Hannah major component of this being related to Hannah saphenous vein reflux. READMISSION 11/01/2023 Patient is now Hannah 68 year old woman with type 2 diabetes and Hannah history of chronic venous insufficiency. She was here in clinic in 2017 with Hannah right leg ulcer felt to be secondary to an infection. She had venous duplex workup at that time that did not show any reflux. Her arterial studies were apparently normal. Biopsy was consistent with chronic venous stasis however She has had Hannah difficult time this year with an abscess in her right knee that eventually required plastic surgery. She also had Hannah recent wound on the right ankle but that is closed. I think the current wound we are seeing was started sometime in late August. She said this started as Hannah trauma on Hannah bed frame. She saw her primary doctor on 09/06/2023 because of this ulcer. She has been using Neosporin and Hannah Band-Aid. Currently she has Hannah small superficial ulcer measuring 0.5 x 0.5 cm Past medical history includes well-controlled type 2 diabetes with Hannah recent hemoglobin A1c of 5.6, chronic venous hypertension and PAD. ABI in our clinic was 1.39 on the left. She has easily palpable pulses however. I do not believe she has Hannah significant arterial issue certainly not enough to inhibit healing of this wound 11-09-23 upon evaluation today patient appears to be doing well currently in regard to her wound. This actually appears to be completely healed based on what I am seeing. I do not see any signs of active infection which is great news. No fevers, chills, nausea, vomiting, or diarrhea. Objective Constitutional Well-nourished and well-hydrated in no acute distress. Vitals Time Taken: 8:52 AM, Height: 64 in, Weight: 160 lbs, BMI: 27.5, Temperature: 98.2 F, Pulse: 86 bpm, Respiratory Rate: 18 breaths/min, Blood Pressure: 134/83  mmHg. Respiratory normal breathing without difficulty. Psychiatric this patient is able to make decisions and demonstrates good insight into disease process. Alert and Oriented x 3. pleasant and cooperative. General Notes: Upon inspection patient's wound bed actually showed signs of good granulation and  epithelization at this point. Fortunately I do not see any evidence of worsening I believe that the patient is making excellent headway here towards closure which is great news. No fevers, chills, nausea, vomiting, or diarrhea. Integumentary (Hair, Skin) Wound #3 status is Healed - Epithelialized. Original cause of wound was Gradually Appeared. The date acquired was: 07/19/2023. The wound has been in treatment 1 weeks. The wound is located on the Left,Lateral Lower Leg. The wound measures 0cm length x 0cm width x 0cm depth; 0cm^2 area and 0cm^3 volume. There is Hannah medium amount of serosanguineous drainage noted. There is large (67-100%) red granulation within the wound bed. There is no necrotic tissue within the wound bed. Assessment Active Problems ICD-10 Chronic venous hypertension (idiopathic) with ulcer and inflammation of left lower extremity Non-pressure chronic ulcer of other part of left lower leg limited to breakdown of skin Type 2 diabetes mellitus with other skin ulcer Plan Discharge From [facility] Services: Discharge from Wound Care Center Treatment Complete - May put Hannah and D ointment and band aid on for protection for Hannah week. Wear Tubi grip until compression socks received Call if needed 1. Based on what I am seeing I do believe that the patient is gena do well with her leg at this point she does know to get compression socks and she tells me STEPHANIEMARIE, STOFFEL ([id number]) [id number] Page 6 of 6 that she is going to call aspirin get those ordered. 2. For now being that she is completely healed I would recommend AandD ointment and then subsequently I will have  her use the Tubigrip for the time being and we will see how things go if anything changes she knows to contact the office and let me know. We will see her back for Hannah follow-up visit as needed. Electronic Signature(s) Signed: 11/09/2023 9:14:27 AM By: Bethena Ferraris PA-C Entered By: Bethena Ferraris on 11/09/2023 06:14:27 -------------------------------------------------------------------------------- SuperBill Details Patient Name: Date of Service: [name], [name]. 11/09/2023 Medical Record Number: [id number] Patient Account Number: [id number] Date of Birth/Sex: Treating RN: 1956/05/23 (68 y.o. [name] Primary Care Provider: MCDIA RMID, TO DD Other Clinician: Referring Provider: Treating Provider/Extender: Bethena Ferraris MCDIA RMID, TO DD Weeks in Treatment: 1 Diagnosis Coding ICD-10 Codes Code Description [phone] Chronic venous hypertension (idiopathic) with ulcer and inflammation of left lower extremity L97.821 Non-pressure chronic ulcer of other part of left lower leg limited to breakdown of skin E11.622 Type 2 diabetes mellitus with other skin ulcer Facility Procedures : CPT4 Code: [id number] Description: 99213 - WOUND CARE VISIT-LEV 3 EST PT Modifier: Quantity: 1 Physician Procedures : CPT4 Code Description Modifier [id number] - WC PHYS LEVEL 3 - EST PT ICD-10 Diagnosis Description I87.332 Chronic venous hypertension (idiopathic) with ulcer and inflammation of left lower extremity L97.821 Non-pressure chronic ulcer of other part  of left lower leg limited to breakdown of skin E11.622 Type 2 diabetes mellitus with other skin ulcer Quantity: 1 Electronic Signature(s) Signed: 11/09/2023 5:32:36 PM By: Claudene Blossom MSN RN CNS WTA Signed: 11/09/2023 6:16:28 PM By: Bethena Ferraris PA-C Previous Signature: 11/09/2023 9:14:38 AM Version By: Bethena Ferraris PA-C Entered By: Claudene [name] on 11/09/2023 06:22:57

## 2023-11-29 NOTE — Telephone Encounter (Signed)
Medication shipment given to patient.   Advised on application and the need to renew.   All questions answered.

## 2023-12-05 ENCOUNTER — Ambulatory Visit (HOSPITAL_BASED_OUTPATIENT_CLINIC_OR_DEPARTMENT_OTHER): Payer: Medicare HMO | Admitting: General Surgery

## 2023-12-07 ENCOUNTER — Other Ambulatory Visit: Payer: Self-pay | Admitting: Family Medicine

## 2023-12-15 ENCOUNTER — Ambulatory Visit: Payer: Medicare HMO | Admitting: Family Medicine

## 2023-12-21 ENCOUNTER — Encounter: Payer: Self-pay | Admitting: Internal Medicine

## 2023-12-21 ENCOUNTER — Ambulatory Visit: Payer: Medicare HMO | Admitting: Internal Medicine

## 2023-12-21 VITALS — BP 122/80 | HR 78 | Ht 64.0 in | Wt 155.0 lb

## 2023-12-21 DIAGNOSIS — K289 Gastrojejunal ulcer, unspecified as acute or chronic, without hemorrhage or perforation: Secondary | ICD-10-CM

## 2023-12-21 DIAGNOSIS — D649 Anemia, unspecified: Secondary | ICD-10-CM

## 2023-12-21 DIAGNOSIS — M6208 Separation of muscle (nontraumatic), other site: Secondary | ICD-10-CM | POA: Diagnosis not present

## 2023-12-21 DIAGNOSIS — K529 Noninfective gastroenteritis and colitis, unspecified: Secondary | ICD-10-CM

## 2023-12-21 MED ORDER — METRONIDAZOLE 500 MG PO TABS: 500.0000 mg | ORAL_TABLET | Freq: Two times a day (BID) | ORAL | 0 refills | Status: DC

## 2023-12-21 MED ORDER — OMEPRAZOLE 40 MG PO CPDR: 40.0000 mg | DELAYED_RELEASE_CAPSULE | Freq: Every day | ORAL | 3 refills | Status: AC

## 2023-12-21 NOTE — Progress Notes (Signed)
[name] 67 y.o. 06/09/1956 [id number]  Assessment & Plan:   Encounter Diagnoses  Name Primary?   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  Trial of metronidazole for suspected possible SIBO.  She is certainly at higher risk of that given her anatomy status post gastric bypass with Roux-en-Y and an ileosigmoid anastomosis status post colectomy.  I have advised her that if this fails to improve things she should contact me and we will sort through it further and consider other investigations versus treatment.  I have also recommended she reduce and really try to eliminate artificial sweeteners which could contribute to diarrhea.  Meds ordered this encounter  Medications   metroNIDAZOLE (FLAGYL) 500 MG tablet    Sig: Take 1 tablet (500 mg total) by mouth 2 (two) times daily.    Dispense:  20 tablet    Refill:  0   omeprazole (PRILOSEC) 40 MG capsule    Sig: Take 1 capsule (40 mg total) by mouth daily.    Dispense:  90 capsule    Refill:  3    Call for appointment for future refills please   Anticipate return in 1 to 2 years routinely pending clinical course.  Review of hemoglobin and CBC and prior ferritin.  Mild anemia is stable suspect anemia of chronic disease.  Continue iron therapy given her malabsorption risk.  Subjective:   Gastroenterology summary:  Recurrent lower GI bleeding thought to be diverticulosis status post subtotal colectomy and ileosigmoid anastomosis  Gastrojejunal ulcer with hemorrhage 2020, status post gastric bypass  Family history of colon cancer in father less than age 49 last sigmoidoscopy exam 2021 without polyps (she does have a history of colon polyps also) repeat due 2026   Chief Complaint: Medication management, history of gastrojejunal ulcer, diarrhea  HPI 68 year old woman with a history of gastric bypass and  gastrojejunal ulcer in 2020, also status post colectomy with ileosigmoid anastomosis, family history of colon cancer, iron deficiency anemia due to blood loss and malabsorption here for f/u - she went to refill omeprazole and given that last visit with me was in 2021 she was recommended to have a follow-up.  She has not experienced recurrent signs of bleeding.  She experiences frequent bowel movements, approximately four times a day, with watery consistency and a strong odor. These symptoms have been ongoing for about five years, predating her use of Ozempic, which she has been on for two to three years. She rarely experiences bowel leakage at night. No urinary incontinence. She describes a sensation of a knot in her stomach, which she perceives as a bulge, but there is no associated pain.  She does use artificial sweeteners.  Her family history is significant for her father having had colon cancer, which necessitates regular colon examinations next due 2026       Latest Ref Rng & Units 06/30/2023    5:18 PM 12/23/2022   12:47 AM 12/22/2022    1:41 AM  CBC  WBC 3.4 - 10.8 x10E3/uL 4.5  5.2  5.2   Hemoglobin 11.1 - 15.9 g/dL 41.6  60.6  9.4   Hematocrit 34.0 - 46.6 % 32.2  31.3  29.1   Platelets 150 - 450 x10E3/uL 214  229  232      Lab Results  Component Value Date   FERRITIN 89 09/30/2022  Allergies  Allergen Reactions   Aspirin Other (See Comments)    CAN NOT TAKE DUE TO GI BLEED GI BLEED   Pantoprazole Sodium     NEEDS CAPSULE OR DISSOLVABLE PPI - TABLET WILL NOT ABSORB WELL S/P GASTRIC BYPASS   Nsaids Other (See Comments)    GI BLEED GI BLEED   Tolmetin Other (See Comments)    GI BLEED   Current Meds  Medication Sig   ascorbic acid (VITAMIN C) 250 MG tablet Take 250 mg by mouth daily.   aspirin 81 MG chewable tablet Chew 81 mg by mouth daily.   atorvastatin (LIPITOR) 40 MG tablet Take 1 tablet (40 mg total) by mouth daily at 6 PM. TAKE ONE TABLET DAILY   calcium  carbonate (OS-CAL) 600 MG TABS tablet Take 1 tablet (600 mg total) by mouth 2 (two) times daily with a meal. (Patient taking differently: Take 600 mg by mouth daily with breakfast.)   cetirizine (ZYRTEC) 10 MG tablet Take 1 tablet (10 mg total) by mouth daily as needed for allergies.   clindamycin (CLEOCIN) 75 MG/5ML solution Apply to wound with Q-tip soaked in solution three times a day to control wound odor.   docusate sodium (COLACE) 100 MG capsule TAKE ONE CAPSULE BY MOUTH TWICE DAILY   Ferrous Sulfate (IRON SUPPLEMENT) 300 MG/6.8ML SOLN Take 5 mLs by mouth daily.   fluticasone (FLONASE) 50 MCG/ACT nasal spray Place 2 sprays into both nostrils daily.   furosemide (LASIX) 40 MG tablet TAKE ONE TABLET EVERY DAY   gabapentin (NEURONTIN) 100 MG capsule Take 1 capsule (100 mg total) by mouth 3 (three) times daily as needed.   hydrochlorothiazide (MICROZIDE) 12.5 MG capsule Take 1 capsule (12.5 mg total) by mouth daily.   metroNIDAZOLE (FLAGYL) 500 MG tablet Take 1 tablet (500 mg total) by mouth 2 (two) times daily.   Multiple Vitamin (MULTIVITAMIN) tablet Take 1 tablet by mouth daily.   olmesartan (BENICAR) 20 MG tablet TAKE ONE TABLET BY MOUTH AT BEDTIME   OZEMPIC, 2 MG/DOSE, 8 MG/3ML SOPN Inject 2 mg into the skin once a week. Monday   polyethylene glycol powder (GLYCOLAX/MIRALAX) 17 GM/SCOOP powder Take 17 g by mouth daily as needed for mild constipation.   sucralfate (CARAFATE) 1 g tablet Take 1 tablet (1 g total) by mouth 4 (four) times daily -  with meals and at bedtime.   vitamin B-12 (CYANOCOBALAMIN) 1000 MCG tablet Take 1,000 mcg by mouth daily.   vitamin E 180 MG (400 UNITS) capsule Take 400 Units by mouth daily.   Vitamins A & D 5000-400 units CAPS Take 5,000 Units by mouth daily.   zolpidem (AMBIEN) 5 MG tablet TAKE ONE TABLET BY MOUTH AT BEDTIME AS NEEDED FOR SLEEP   [DISCONTINUED] omeprazole (PRILOSEC) 40 MG capsule TAKE ONE CAPSULE EACH DAY BEFORE BREAKFAST -OPEN CAPSULE AND PLACE IN  LIQUID IF POSSIBLE   Past Medical History:  Diagnosis Date   AC (acromioclavicular) joint arthritis 12/23/2017   Left side   Acute renal failure (HCC) 09/24/2016   Allergic rhinitis 05/27/2010   Qualifier: Diagnosis of  By: Mauricio Po MD, James     Allergy    Anemia    Anterior epistaxis 02/27/2021   Arthritis    knees   Asthma, cough variant 03/10/2012   Back pain 10/04/2012   Baker's cyst of knee, right 10/17/2019   Baker's cyst of knee, right 10/17/2019   Biliary sludge 04/08/2014   Taking ursodiol chronically     Blood in  stool 10/07/2014   Blood transfusion without reported diagnosis    BMI 40.0-44.9, adult (HCC) 05/22/2021   Bunion 03/28/2014   Cellulitis of leg, left 09/14/2016   Cervical pain (neck) 03/26/2016   Chest pain 05/12/2015   Constipation 10/19/2013   Chronic in the setting of known diverticulosis and history of bleeds.     Contusion of left knee 12/03/2022   Contusion of right knee 12/03/2022   Cramping of hands 02/27/2016   CYST, KIDNEY, ACQUIRED 08/15/2007   Qualifier: Diagnosis of  By: Ludwig Clarks MD, [facility]     DDD (degenerative disc disease), lumbar 04/14/2016   Managed by [facility] [id number]   DE QUERVAIN'S TENOSYNOVITIS 11/30/2010   Qualifier: Diagnosis of  By: Jeanice Lim MD, Kingsley Spittle     Degenerative arthritis of right shoulder region 08/2013   Diabetes mellitus (HCC) 05/05/2020   A1c 2010 = 6.5%.  No A1c greater than 6.5% since.    Diverticulosis    Diverticulosis of colon with hemorrhage    Hx  Of recurrent Diverticular bleeding - several prior admissions    Diverticulosis of colon with hemorrhage,  s/p colectomy ileorectal anastamosis    Hx  Of recurrent Diverticular bleeding - 4 to 5  prior admissions for GI bleeding 2016 and before. Seen inhouse by [facility] in 2016   Diverticulosis of colon without hemorrhage 11/18/2014   Noted on colonoscopy 11/2014    Dry eye syndrome of bilateral lacrimal glands 12/23/2021   Dry eye syndrome of  bilateral lacrimal glands 12/23/2021   Epistaxis 02/27/2021   Esophageal dysmotility 02/14/2017   Esophageal Manometry 02/11/17   External hemorrhoids without complication 12/28/2012   Family history of colon cancer in father diagnosed in 68s 06/21/2019   Gastrojejunal ulcer with hemorrhage    Gastrojejunal ulcer with hemorrhage    GERD (gastroesophageal reflux disease)    H/O total knee replacement, 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 Seen inhouse by [facility] in 2016   Hx of gastrointestinal hemorrhage 05/18/2019   Extending back 2001, 2007 bleeds. 05/2013 colonoscopy.  Dr. Arlyce Dice.  For hematochezia, first-degree relative with colon cancer, personal history adenomatous colon polyps.  Sessile, sigmoid polyp removed; path consistent with inflammatory polyp..  Dense pandiverticulosis.  No fresh or old blood encountered.  Presumed to have had diverticular bleed. 11/2014 colonoscopy For hematochezia.  Pandiverticul   Hyperlipidemia associated with type 2 diabetes mellitus (HCC) 02/13/2014   Hypertension    under control with meds., has been on med. > 20 yr.   Hypertension associated with diabetes (HCC) 01/05/2012   Hypokalemia 03/18/2015   3.3 on 03/17/15   Hypokalemia due to loop diuretic therapy 03/18/2015   3.3 on 03/17/15   Hypopigmented skin lesion 02/23/2011   Ileus following gastrointestinal surgery (HCC) 12/12/2015   Insomnia 08/29/2012   Intractable vomiting 09/24/2016   Iridocyclitis, noninfectious, left eye 08/26/2020   Diagnosis: optometrist angela Vanessa Ralphs OD at [facility]. Referred back to Dr Vonna Kotyk (MD)   Leg  cramping 04/22/2014   Lesion of right lower eyelid 02/27/2021   Lipoma of lower extremity 03/07/2013   Overview:  RIGHT THIGH   Low back pain 03/26/2016   Lower GI bleed    Lumbar back pain 03/26/2016   Medial meniscus tear 1997   Right Knee   Mini stroke    Morbid obesity (HCC) 12/29/2006   Multiple open wounds of lower leg 11/12/2016   Muscle pain 05/26/2015   Nerve palsy, acute, left upper extremity 01/29/2022   Associated with acute, traumatic left shoulder dislocation and interventional reduction   Nocturnal leg cramps 06/11/2022   Onychomycosis of multiple toenails with type 2 diabetes mellitus and peripheral angiopathy (HCC) 12/18/2021   Orthostatic dizziness 08/26/2010   Qualifier: Diagnosis of  By: Earlene Plater DO, Alcario Drought     Osteoarthritis of both knees 03/12/2016   S/P bilateral knee replacment 2000   Osteoarthritis, bilateral hands/fingers 04/02/2021   Peripheral neuropathy 05/20/2017   Prediabetes 05/05/2020   Premature supraventricular beats 12/25/2015   EKG 12/25/15    Rash and nonspecific skin eruption 04/01/2017   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) 05/18/2019   CT knee 04/2019 :RIGHT knee prosthesis with moderate knee joint effusion   Rotator cuff rupture, complete 08/2013   right   Shoulder dislocation, Left, acute 01/29/2022   ED reduction of acute traumatic left shoulder dislocation with nerve palsy   Shoulder dislocation, Left, acute 01/29/2022   ED reduction of acute traumatic left shoulder dislocation with nerve palsy   Shoulder impingement 08/2013   right   Skin lesion of right lower extremity 05/27/2016   Small bowel obstruction (HCC) 12/12/2015   Snoring 08/24/2018   Stroke (HCC) 2006   Remote left lacunar infarct noted on CT head 2006, 2010    Subacromial bursitis 05/21/2013   Superficial thrombophlebitis 01/29/2016   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   See  assessment/plan from 03/22/2019 office visit with colposcopic evaluation by Dr Araceli Bouche (FM) in [facility]  suspected   Tingling 11/17/2017   Type 2 diabetes mellitus with other specified complication, without long-term current use of insulin (HCC)    Ulcer    Ulcer at site of surgical anastomosis following bypass of stomach 05/18/2019   Vaginal bleeding 05/17/2019   See 03/12/2019 Telephone call note and Office visit 03/23/2019 with Denny Levy MD in [facility] clinic   Venous insufficiency of both lower extremities, Right > Left leg 10/12/2019   Venous stasis ulcer of left ankle with fat layer exposed with varicose veins (HCC) 10/20/2016   Wears dentures    upper   Wears partial dentures    lower   Wound infection 12/18/2022   Past Surgical History:  Procedure Laterality Date   ABDOMINAL HYSTERECTOMY  ?1987   partial   APPLICATION OF WOUND VAC Right 12/22/2022   Procedure: APPLICATION OF WOUND VAC;  Surgeon: Peggye Form, DO;  Location: [facility] OR;  Service: Plastics;  Laterality: Right;   APPLICATION OF WOUND VAC Right 03/16/2023   Procedure: APPLICATION OF WOUND VAC;  Surgeon: Peggye Form, DO;  Location: [facility];  Service: Plastics;  Laterality: Right;   COLONOSCOPY  05/02/2000; 05/08/2013, 11/08/14   COSMETIC SURGERY  02/22/2014   skin removal surgery from lower abdomen    ENDOVENOUS ABLATION SAPHENOUS VEIN W/ LASER Right 08/12/2016   endovenous laser ablation right greater saphenous vein by Gretta Began MD    ENDOVENOUS ABLATION SAPHENOUS VEIN W/ LASER Left 10/21/2016   EVLA L GSV by Tawanna Cooler  Early MD   ESOPHAGOGASTRODUODENOSCOPY N/A 05/17/2019   Procedure: ESOPHAGOGASTRODUODENOSCOPY (EGD);  Surgeon: Iva Boop, MD;  Location: [facility] ENDOSCOPY;  Service: Endoscopy;  Laterality: N/A;   HEMATOMA EVACUATION Right 12/22/2022   Procedure: EXCISION OF right knee WOUND AND  hematoma evacuation;  Surgeon: Peggye Form, DO;  Location: [facility] OR;  Service: Plastics;   Laterality: Right;   HEMOSTASIS CLIP PLACEMENT  05/17/2019   Procedure: HEMOSTASIS CLIP PLACEMENT;  Surgeon: Iva Boop, MD;  Location: [facility] ENDOSCOPY;  Service: Endoscopy;;   METATARSAL OSTEOTOMY Right 1995   Right Bunion Repair   ROTATOR CUFF REPAIR Left 04/27/2018   ROUX-EN-Y GASTRIC BYPASS  05/07/2012   SCLEROTHERAPY  05/17/2019   Procedure: Susa Day;  Surgeon: Iva Boop, MD;  Location: [facility] ENDOSCOPY;  Service: Endoscopy;;   SHOULDER ARTHROSCOPY WITH ROTATOR CUFF REPAIR AND SUBACROMIAL DECOMPRESSION Right 08/09/2013   Procedure: RIGHT SHOULDER ARTHROSCOPY WITH SUBACROMIAL DECOMPRESSION, DISTAL CLAVICLE EXCISION AND ROTATOR CUFF REPAIR and release biceps;  Surgeon: Loreta Ave, MD;  Location: [facility];  Service: Orthopedics;  Laterality: Right;   SKIN SPLIT GRAFT Right 03/16/2023   Procedure: Split-thickness skin graft to right knee wound;  Surgeon: Peggye Form, DO;  Location: [facility];  Service: Plastics;  Laterality: Right;   SUBTOTAL COLECTOMY  11/24/2015   [facility]: laparoscopic-assisted subtotal colectomy with ileorectal anastomosis.   TOTAL KNEE ARTHROPLASTY Left    TOTAL KNEE ARTHROPLASTY Right    Social History   Social History Narrative   ** Merged History Encounter **       She is divorced and has 1 child She works in a daycare facility Occasional wine former smoker no drug use   family history includes Cancer (age of onset: 65) in her father; Colon cancer in her father; Colon polyps in her brother and brother; Diabetes in her brother, brother, brother, brother, mother, sister, and sister; Glaucoma in her brother, father, and sister; Heart disease in her sister; Hypertension in her mother and son; Macular degeneration in her brother, father, mother, and sister; Stroke in her brother and sister.   Review of Systems Positive for joint pains back pain, muscle cramps.  She wonders if she has been having some  nosebleeds.  Otherwise negative.  Objective:   Physical Exam BP 122/80   Pulse 78   Ht 5\' 4"  (1.626 m)   Wt 155 lb (70.3 kg)   BMI 26.61 kg/m  NAD Abd soft NT,  + scars, BS +, there is a small diastasis recti in lower epigastrc area

## 2023-12-21 NOTE — Patient Instructions (Signed)
We have sent the following medications to your pharmacy for you to pick up at your convenience: Omeprazole, Flagyl  Let us know if the Flagy helps the diarrhea.  Try to eliminate artificial sweeteners. Handout provided.   _______________________________________________________  If your blood pressure at your visit was 140/90 or greater, please contact your primary care physician to follow up on this.  _______________________________________________________  If you are age 68 or older, your body mass index should be between 23-30. Your Body mass index is 26.61 kg/m. If this is out of the aforementioned range listed, please consider follow up with your Primary Care Provider.  If you are age 79 or younger, your body mass index should be between 19-25. Your Body mass index is 26.61 kg/m. If this is out of the aformentioned range listed, please consider follow up with your Primary Care Provider.   ________________________________________________________  The [facility] providers would like to encourage you to use [facility] to communicate with providers for non-urgent requests or questions.  Due to long hold times on the telephone, sending your provider a message by [facility] may be a faster and more efficient way to get a response.  Please allow 48 business hours for a response.  Please remember that this is for non-urgent requests.  _______________________________________________________   I appreciate the opportunity to care for you. Stan Head, MD, [facility]

## 2023-12-22 ENCOUNTER — Encounter: Payer: Self-pay | Admitting: Family Medicine

## 2023-12-22 DIAGNOSIS — M6208 Separation of muscle (nontraumatic), other site: Secondary | ICD-10-CM

## 2023-12-22 HISTORY — DX: Separation of muscle (nontraumatic), other site: M62.08

## 2024-01-11 ENCOUNTER — Telehealth: Payer: Self-pay

## 2024-01-11 NOTE — Telephone Encounter (Signed)
 Patient calls nurse line requesting an apt.   She reports early last week she hit her head on the door getting into her vehicle. She denies losing consciousness at the time, however stated "it knocked the wind of me." She reports sitting in her car for a few moments afterwards.  She reports the area did "bust open" at the time, however has "started to close up." She reports her main concern is the continued pain to the area associated with headaches.   She denies vision changes, dizziness or excessive sleepiness.  Patient scheduled for Friday for evaluation.   ED precautions discussed with patient in the meantime.

## 2024-01-13 ENCOUNTER — Ambulatory Visit (INDEPENDENT_AMBULATORY_CARE_PROVIDER_SITE_OTHER): Admitting: Student

## 2024-01-13 VITALS — BP 110/77 | HR 85 | Ht 64.0 in | Wt 154.0 lb

## 2024-01-13 DIAGNOSIS — S0083XA Contusion of other part of head, initial encounter: Secondary | ICD-10-CM | POA: Diagnosis not present

## 2024-01-13 MED ORDER — DICLOFENAC SODIUM 1 % EX GEL: 4.0000 g | Freq: Four times a day (QID) | CUTANEOUS | 0 refills | Status: AC

## 2024-01-13 NOTE — Patient Instructions (Signed)
 It was great to see you! Thank you for allowing me to participate in your care!   Our plans for today:  -Your exam and story is reassuring against an intracranial issue-this is likely a bony contusion and this can take up to 4 weeks for her to feel better -I recommend taking up to 4 g of Tylenol a day to help with pain -I am also prescribing Voltaren gel to help with pain that you can put on top of this area as well  Take care and seek immediate care sooner if you develop any concerns.  Levin Erp, MD

## 2024-01-13 NOTE — Progress Notes (Signed)
    SUBJECTIVE:   CHIEF COMPLAINT / HPI:   Telephone Call 3/12: Patient calls nurse line requesting an apt.  She reports early last week she hit her head on the door getting into her vehicle. She denies losing consciousness at the time, however stated "it knocked the wind of me." She reports sitting in her car for a few moments afterwards. She reports the area did "bust open" at the time, however has "started to close up." She reports her main concern is the continued pain to the area associated with headaches.  She denies vision changes, dizziness or excessive sleepiness. Patient scheduled for Friday for evaluation.  ED precautions discussed with patient in the meantime.   Presents with headaches and blurry vision following a head injury two weeks ago.  They were attempting to get their dog in the back seat of their car when they hit their head on the car door.  No rusty/metal that hit her per patient, states she is UTD with TDAP They describe the headaches as being located in area over their forehead.  They deny vomiting, focal weakness, and double vision, photophobia, balance or memory issues.  They have been managing the headaches with two Tylenol daily. Intermittent blurry vision No fevers Not on blood thinners Did not lose consciousness  PERTINENT  PMH / PSH: T2DM, hx CVA,   OBJECTIVE:   BP 110/77   Pulse 85   Ht 5\' 4"  (1.626 m)   Wt 154 lb (69.9 kg)   SpO2 100%   BMI 26.43 kg/m   General: Well appearing, NAD, awake, alert, responsive to questions Head: goose-egg on forehead, skin lesion with no erythema/fluctuance, well healed CV: Regular rate and rhythm no murmurs rubs or gallops Respiratory: Chest rises symmetrically,  no increased work of breathing Neuro: CN II: PERRL CN III, IV,VI: EOMI CV V: Normal sensation in V1, V2, V3 CVII: Symmetric smile and brow raise CN VIII: Normal hearing CN IX,X: Symmetric palate raise  CN XI: 5/5 shoulder shrug CN XII: Symmetric  tongue protrusion  UE and LE strength 5/5 Normal sensation in UE and LE bilaterally  No ataxia with finger to nose  ASSESSMENT/PLAN:   Assessment & Plan Contusion of other part of head, initial encounter Does have bony contusion of her forehead.  Neurologic examination benign.  Mechanism of action makes it unlikely to be an intracranial issue.  Does not have symptoms or signs of concussion. -Voltaren gel (not a candidate for NSAIDs due to GI bleed history) -Tylenol up to 4 g daily discussed with patient -If continuing >6 weeks or any red flag sxs return to care -ED/return precautions discussed   Levin Erp, MD [facility]

## 2024-01-17 ENCOUNTER — Telehealth: Payer: Self-pay

## 2024-01-17 NOTE — Progress Notes (Signed)
 Pharmacy Medication Assistance Program Note    01/24/2024  Patient ID: [name], female   DOB: [birth year], 68 y.o.   MRN: [id number]     01/17/2024  Outreach Medication One  Manufacturer Medication One [employer] Drugs Ozempic  Dose of Ozempic 2MG   Type of Radiographer, therapeutic Assistance  Date Application Sent to Patient 11/29/2023  Application Items Requested Application  Date Application Sent to Prescriber 01/17/2024  Name of Prescriber Todd McDiarmid  Date Application Received From Patient 01/17/2024  Application Items Received From Patient Application  Date Application Received From Provider 01/24/2024  Date Application Submitted to Manufacturer 01/24/2024  Method Application Sent to Manufacturer Fax   Renewal submitted.

## 2024-01-26 ENCOUNTER — Ambulatory Visit: Payer: Medicare HMO | Admitting: Podiatry

## 2024-01-26 NOTE — Telephone Encounter (Signed)
 Received call from [place] from [employer] regarding application for [employer]. She reports that there are 2 corrections that needs to be made to paperwork.   She states that total Box quantity needs to equal 4.   On page 7 of provider section, DOB of patient is missing.   Please fax back completed paperwork to [phone] with Pt ID number: [id number]  Call back number for Angelique Blonder is [phone].  Veronda Prude, RN

## 2024-01-27 NOTE — Telephone Encounter (Addendum)
 Corrected DOB on page 7 and updated qty of Ozempic from 3 to 4 pens. Faxed to [employer].

## 2024-02-02 NOTE — Progress Notes (Signed)
 Pharmacy Medication Assistance Program Note    02/02/2024  Patient ID: [name], female   DOB: [birth year], 68 y.o.   MRN: [id number]     01/17/2024  Outreach Medication One  Manufacturer Medication One [employer] Drugs Ozempic  Dose of Ozempic 2MG   Type of Radiographer, therapeutic Assistance  Date Application Sent to Patient 11/29/2023  Application Items Requested Application  Date Application Sent to Prescriber 01/17/2024  Name of Prescriber Todd McDiarmid  Date Application Received From Patient 01/17/2024  Application Items Received From Patient Application  Date Application Received From Provider 01/24/2024  Date Application Submitted to Manufacturer 01/24/2024  Method Application Sent to Manufacturer Fax  Patient Assistance Determination Approved  Approval Start Date 01/30/2024  Approval End Date 10/31/2024     RENEWAL APPROVED

## 2024-02-03 NOTE — Telephone Encounter (Signed)
 reviewed

## 2024-02-04 ENCOUNTER — Other Ambulatory Visit: Payer: Self-pay | Admitting: Family Medicine

## 2024-02-16 ENCOUNTER — Telehealth: Payer: Self-pay | Admitting: Pharmacist

## 2024-02-16 NOTE — Telephone Encounter (Signed)
 Patient contacted for communication that [employer] Supply of Ozempic 2mg  pens arrived and has been prepared for pick-up.   Patient reports she is doing well, without any side effects.   She plans to pick up medication later today.    Total time with patient call and documentation of interaction: 11 minutes.

## 2024-02-16 NOTE — Telephone Encounter (Signed)
 Reviewed and agree with Dr Macky Lower plan.

## 2024-03-07 ENCOUNTER — Ambulatory Visit
Admission: RE | Admit: 2024-03-07 | Discharge: 2024-03-07 | Disposition: A | Discharge status: Home / Self Care | Source: Ambulatory Visit | Attending: Family Medicine | Admitting: Family Medicine

## 2024-03-07 DIAGNOSIS — Z1231 Encounter for screening mammogram for malignant neoplasm of breast: Secondary | ICD-10-CM | POA: Diagnosis not present

## 2024-03-22 ENCOUNTER — Ambulatory Visit (INDEPENDENT_AMBULATORY_CARE_PROVIDER_SITE_OTHER): Admitting: Family Medicine

## 2024-03-22 ENCOUNTER — Encounter: Payer: Self-pay | Admitting: Family Medicine

## 2024-03-22 VITALS — BP 120/81 | HR 74 | Ht 64.0 in | Wt 159.1 lb

## 2024-03-22 DIAGNOSIS — E119 Type 2 diabetes mellitus without complications: Secondary | ICD-10-CM

## 2024-03-22 DIAGNOSIS — Z9884 Bariatric surgery status: Secondary | ICD-10-CM

## 2024-03-22 DIAGNOSIS — E889 Metabolic disorder, unspecified: Secondary | ICD-10-CM | POA: Diagnosis not present

## 2024-03-22 DIAGNOSIS — K219 Gastro-esophageal reflux disease without esophagitis: Secondary | ICD-10-CM | POA: Diagnosis not present

## 2024-03-22 DIAGNOSIS — E639 Nutritional deficiency, unspecified: Secondary | ICD-10-CM | POA: Diagnosis not present

## 2024-03-22 DIAGNOSIS — L97929 Non-pressure chronic ulcer of unspecified part of left lower leg with unspecified severity: Secondary | ICD-10-CM | POA: Diagnosis not present

## 2024-03-22 DIAGNOSIS — G4700 Insomnia, unspecified: Secondary | ICD-10-CM

## 2024-03-22 DIAGNOSIS — E1169 Type 2 diabetes mellitus with other specified complication: Secondary | ICD-10-CM

## 2024-03-22 DIAGNOSIS — Z79899 Other long term (current) drug therapy: Secondary | ICD-10-CM | POA: Diagnosis not present

## 2024-03-22 DIAGNOSIS — D649 Anemia, unspecified: Secondary | ICD-10-CM | POA: Diagnosis not present

## 2024-03-22 LAB — POCT GLYCOSYLATED HEMOGLOBIN (HGB A1C): HbA1c, POC (controlled diabetic range): 5.5 % (ref 0.0–7.0)

## 2024-03-22 MED ORDER — SUCRALFATE 1 G PO TABS: 1.0000 g | ORAL_TABLET | Freq: Three times a day (TID) | ORAL | 3 refills | Status: DC

## 2024-03-22 NOTE — Patient Instructions (Signed)
  YOur blood pressure and weight look good.  Let Dr Maanav Kassabian know what dose of Ozempic  you are taking.   We will send in a Blood sugar measuring machine with strips to your pharmacy.   Use your elastic band to stretch that muscle that goes from your left shoulder bald to your shoulder.    We are checking your kidney, liver, electrolytes, and vitamin and iron levels. .   Dr Hristopher Missildine will let you know it they are abnormal.  If they are normal, he will send your a message through your MyChart

## 2024-03-23 ENCOUNTER — Encounter: Payer: Self-pay | Admitting: Family Medicine

## 2024-03-23 ENCOUNTER — Other Ambulatory Visit: Payer: Self-pay | Admitting: Family Medicine

## 2024-03-23 DIAGNOSIS — D649 Anemia, unspecified: Secondary | ICD-10-CM | POA: Insufficient documentation

## 2024-03-23 MED ORDER — BLOOD GLUCOSE MONITOR KIT
PACK | 0 refills | Status: DC
Start: 2024-03-23 — End: 2024-03-23

## 2024-03-23 NOTE — Assessment & Plan Note (Signed)
 Established problem. Stable. Patient is at goal of indigestion and heartburn control. No signs of complications, medication side effects, or red flags. Continue omeprazole  and Carafate   Patient with history of intestinal anastamosis ulcer.

## 2024-03-23 NOTE — Progress Notes (Signed)
 [name] is alone Sources of clinical information for visit is/are patient and past medical records. Nursing assessment for this office visit was reviewed with the patient for accuracy and revision.     Previous Report(s) Reviewed: wound care notes     03/22/2024    8:27 AM  Depression screen PHQ 2/9  Decreased Interest 0  Down, Depressed, Hopeless 0  PHQ - 2 Score 0  Altered sleeping 1  Tired, decreased energy 0  Change in appetite 0  Feeling bad or failure about yourself  0  Trouble concentrating 0  Moving slowly or fidgety/restless 0  Suicidal thoughts 0  PHQ-9 Score 1   Flowsheet Row Office Visit from 03/22/2024 in [facility] - [facility] Of [place]. [facility] Office Visit from 01/13/2024 in [facility] - A Dept Of [place]. [facility] Office Visit from 09/22/2023 in [facility] - A Dept Of [place]. [facility]  Thoughts that you would be better off dead, or of hurting yourself in some way Not at all Not at all Not at all  PHQ-9 Total Score 1 0 1          03/22/2024    8:27 AM 01/13/2024    4:22 PM 09/22/2023    8:37 AM 09/06/2023    3:48 PM 08/26/2023    9:01 AM  Fall Risk   Falls in the past year? 0 0 1 0 0  Number falls in past yr: 0 0 0 0 0  Injury with Fall? 0 0 0 0 0       03/22/2024    8:27 AM 01/13/2024    4:22 PM 09/22/2023    8:37 AM  PHQ9 SCORE ONLY  PHQ-9 Total Score 1 0 1    There are no preventive care reminders to display for this patient.  Health Maintenance Due  Topic Date Due   OPHTHALMOLOGY EXAM  01/14/2023   COVID-19 Vaccine (7 - Pfizer risk 2024-25 season) 02/23/2024      History/P.E. limitations: none  There are no preventive care reminders to display for this patient.  Diabetes Health Maintenance Due  Topic Date Due   OPHTHALMOLOGY EXAM  01/14/2023   HEMOGLOBIN A1C  09/22/2024    Health Maintenance Due  Topic Date Due   OPHTHALMOLOGY EXAM   01/14/2023   COVID-19 Vaccine (7 - Pfizer risk 2024-25 season) 02/23/2024     No chief complaint on file.    --------------------------------------------------------------------------------------------------------------------------------------------- Visit Problem List with A/P  No problem-specific Assessment & Plan notes found for this encounter.

## 2024-03-23 NOTE — Assessment & Plan Note (Signed)
 History of severe anemia Lab Results  Component Value Date   WBC 5.6 03/22/2024   HGB 11.6 03/22/2024   HCT 35.9 03/22/2024   MCV 96 03/22/2024   PLT 207 03/22/2024   Adequate iron supplementation Continue daily to every other day iron supplementation

## 2024-03-23 NOTE — Assessment & Plan Note (Signed)
 Established problem Stable.  Has reduced her Ambien  to 2.5 mg at bedtime prn

## 2024-03-23 NOTE — Assessment & Plan Note (Signed)
 Established problem. Adequate glycemic control.  Pt is tolerating the current medication regiment. Continue semaglutide  2 mg [place] weekly RTC 3 months

## 2024-03-23 NOTE — Assessment & Plan Note (Addendum)
 Established problem. Stable. Patient is at goal of tolerating eating and no pain or bloating. . Patient taking MVI, Vit B12, Vit A&D, and iron Serum level check total showed adequate supplementation Continue current medications and other regiments.

## 2024-03-26 LAB — CMP14+EGFR
ALT: 31 IU/L (ref 0–32)
AST: 28 IU/L (ref 0–40)
Albumin: 4.1 g/dL (ref 3.9–4.9)
Alkaline Phosphatase: 97 IU/L (ref 44–121)
BUN/Creatinine Ratio: 24 (ref 12–28)
BUN: 20 mg/dL (ref 8–27)
Bilirubin Total: 0.4 mg/dL (ref 0.0–1.2)
CO2: 23 mmol/L (ref 20–29)
Calcium: 9.3 mg/dL (ref 8.7–10.3)
Chloride: 104 mmol/L (ref 96–106)
Creatinine, Ser: 0.84 mg/dL (ref 0.57–1.00)
Globulin, Total: 2.2 g/dL (ref 1.5–4.5)
Glucose: 63 mg/dL — ABNORMAL LOW (ref 70–99)
Potassium: 4.2 mmol/L (ref 3.5–5.2)
Sodium: 141 mmol/L (ref 134–144)
Total Protein: 6.3 g/dL (ref 6.0–8.5)
eGFR: 76 mL/min/{1.73_m2} (ref >59)

## 2024-03-26 LAB — CBC WITH DIFFERENTIAL/PLATELET
Basophils Absolute: 0 10*3/uL (ref 0.0–0.2)
Basos: 1 %
EOS (ABSOLUTE): 0.3 10*3/uL (ref 0.0–0.4)
Eos: 5 %
Hematocrit: 35.9 % (ref 34.0–46.6)
Hemoglobin: 11.6 g/dL (ref 11.1–15.9)
Immature Grans (Abs): 0 10*3/uL (ref 0.0–0.1)
Immature Granulocytes: 0 %
Lymphocytes Absolute: 1.6 10*3/uL (ref 0.7–3.1)
Lymphs: 29 %
MCH: 31.1 pg (ref 26.6–33.0)
MCHC: 32.3 g/dL (ref 31.5–35.7)
MCV: 96 fL (ref 79–97)
Monocytes Absolute: 0.6 10*3/uL (ref 0.1–0.9)
Monocytes: 11 %
Neutrophils Absolute: 3.1 10*3/uL (ref 1.4–7.0)
Neutrophils: 54 %
Platelets: 207 10*3/uL (ref 150–450)
RBC: 3.73 x10E6/uL — ABNORMAL LOW (ref 3.77–5.28)
RDW: 12.8 % (ref 11.7–15.4)
WBC: 5.6 10*3/uL (ref 3.4–10.8)

## 2024-03-26 LAB — VITAMIN B1: Thiamine: 106.3 nmol/L (ref 66.5–200.0)

## 2024-03-26 LAB — FERRITIN: Ferritin: 61 ng/mL (ref 15–150)

## 2024-03-26 LAB — VITAMIN B12: Vitamin B-12: >2000 pg/mL — ABNORMAL HIGH (ref 232–1245)

## 2024-03-26 LAB — VITAMIN D 25 HYDROXY (VIT D DEFICIENCY, FRACTURES): Vit D, 25-Hydroxy: 33.3 ng/mL (ref 30.0–100.0)

## 2024-03-27 ENCOUNTER — Ambulatory Visit: Payer: Self-pay | Admitting: Family Medicine

## 2024-03-27 ENCOUNTER — Telehealth: Payer: Self-pay

## 2024-03-27 DIAGNOSIS — E119 Type 2 diabetes mellitus without complications: Secondary | ICD-10-CM

## 2024-03-27 NOTE — Telephone Encounter (Signed)
 Patient calls nurse line requesting update on glucose monitoring machine and supplies.   She reports that she called the pharmacy this morning and they have not yet received orders.   Advised patient that I would send message to Dr. McDiarmid for order placement.   Order will need to include dx code and number of times patient should be testing per day.   Elsie Halo, RN

## 2024-03-28 MED ORDER — TRUE METRIX AIR GLUCOSE METER W/DEVICE KIT: 1.0000 | PACK | Freq: Every day | 0 refills | Status: DC

## 2024-03-28 NOTE — Telephone Encounter (Signed)
 Prescribed [employer], learning disability

## 2024-03-29 ENCOUNTER — Other Ambulatory Visit: Payer: Self-pay | Admitting: Family Medicine

## 2024-03-29 DIAGNOSIS — E119 Type 2 diabetes mellitus without complications: Secondary | ICD-10-CM

## 2024-03-29 MED ORDER — LANCETS 28G THIN MISC: 1.0000 | Freq: Every day | 99 refills | Status: AC

## 2024-03-29 MED ORDER — TRUE METRIX BLOOD GLUCOSE TEST VI STRP: ORAL_STRIP | 12 refills | Status: DC

## 2024-03-29 NOTE — Telephone Encounter (Signed)
 Received returned call from pharmacy. They will need a separate prescription for lancets and testing strips.   Pended to encounter for provider approval.   Elsie Halo, RN

## 2024-03-29 NOTE — Telephone Encounter (Signed)
Lancets and test strips sent in 

## 2024-03-29 NOTE — Progress Notes (Unsigned)
 [name] is {Pc accompanied by:5710} Sources of clinical information for visit is/are {Information source:60032}. Nursing assessment for this office visit was reviewed with the patient for accuracy and revision.     Previous Report(s) Reviewed: {Outside review:15817}     03/22/2024    8:27 AM  Depression screen PHQ 2/9  Decreased Interest 0  Down, Depressed, Hopeless 0  PHQ - 2 Score 0  Altered sleeping 1  Tired, decreased energy 0  Change in appetite 0  Feeling bad or failure about yourself  0  Trouble concentrating 0  Moving slowly or fidgety/restless 0  Suicidal thoughts 0  PHQ-9 Score 1   Flowsheet Row Office Visit from 03/22/2024 in [facility] - A Dept Of [place]. [facility] Office Visit from 01/13/2024 in [facility] - A Dept Of [place]. [facility] Office Visit from 09/22/2023 in [facility] - A Dept [place]. [facility]  Thoughts that you would be better off dead, or of hurting yourself in some way Not at all Not at all Not at all  PHQ-9 Total Score 1 0 1          03/22/2024    8:27 AM 01/13/2024    4:22 PM 09/22/2023    8:37 AM 09/06/2023    3:48 PM 08/26/2023    9:01 AM  Fall Risk   Falls in the past year? 0 0 1 0 0  Number falls in past yr: 0 0 0 0 0  Injury with Fall? 0 0 0 0 0       03/22/2024    8:27 AM 01/13/2024    4:22 PM 09/22/2023    8:37 AM  PHQ9 SCORE ONLY  PHQ-9 Total Score 1 0 1    There are no preventive care reminders to display for this patient.  Health Maintenance Due  Topic Date Due   OPHTHALMOLOGY EXAM  01/14/2023   COVID-19 Vaccine (7 - Pfizer risk 2024-25 season) 02/23/2024      History/P.E. limitations: {exam; limitations ed:60112}  There are no preventive care reminders to display for this patient.  Diabetes Health Maintenance Due  Topic Date Due   OPHTHALMOLOGY EXAM  01/14/2023   HEMOGLOBIN A1C  09/22/2024    Health Maintenance Due   Topic Date Due   OPHTHALMOLOGY EXAM  01/14/2023   COVID-19 Vaccine (7 - Pfizer risk 2024-25 season) 02/23/2024     No chief complaint on file.    --------------------------------------------------------------------------------------------------------------------------------------------- Visit Problem List with A/P  No problem-specific Assessment & Plan notes found for this encounter.

## 2024-04-20 DIAGNOSIS — M542 Cervicalgia: Secondary | ICD-10-CM | POA: Diagnosis not present

## 2024-04-20 DIAGNOSIS — M79642 Pain in left hand: Secondary | ICD-10-CM | POA: Diagnosis not present

## 2024-05-07 ENCOUNTER — Other Ambulatory Visit: Payer: Self-pay | Admitting: Family Medicine

## 2024-05-07 DIAGNOSIS — E1169 Type 2 diabetes mellitus with other specified complication: Secondary | ICD-10-CM

## 2024-05-07 DIAGNOSIS — M5412 Radiculopathy, cervical region: Secondary | ICD-10-CM | POA: Diagnosis not present

## 2024-05-07 DIAGNOSIS — E119 Type 2 diabetes mellitus without complications: Secondary | ICD-10-CM

## 2024-05-23 DIAGNOSIS — M5412 Radiculopathy, cervical region: Secondary | ICD-10-CM | POA: Diagnosis not present

## 2024-05-25 ENCOUNTER — Other Ambulatory Visit: Payer: Self-pay | Admitting: Family Medicine

## 2024-06-05 ENCOUNTER — Telehealth: Payer: Self-pay

## 2024-06-05 NOTE — Telephone Encounter (Signed)
 Informed patient her novo nordisk shipment of Ozempic  2mg  dose pens are ready for pickup.   4 boxes of Ozempic  2mg  dose pens labeled in med room - 4 month supply

## 2024-06-18 DIAGNOSIS — M1811 Unilateral primary osteoarthritis of first carpometacarpal joint, right hand: Secondary | ICD-10-CM | POA: Diagnosis not present

## 2024-06-19 ENCOUNTER — Ambulatory Visit (INDEPENDENT_AMBULATORY_CARE_PROVIDER_SITE_OTHER)

## 2024-06-19 VITALS — Ht 64.0 in | Wt 160.0 lb

## 2024-06-19 DIAGNOSIS — Z0000 Encounter for general adult medical examination without abnormal findings: Secondary | ICD-10-CM | POA: Diagnosis not present

## 2024-06-19 NOTE — Progress Notes (Signed)
 Because this visit was a virtual/telehealth visit,  certain criteria was not obtained, such a blood pressure, CBG if applicable, and timed get up and go. Any medications not marked as taking were not mentioned during the medication reconciliation part of the visit. Any vitals not documented were not able to be obtained due to this being a telehealth visit or patient was unable to self-report a recent blood pressure reading due to a lack of equipment at home via telehealth. Vitals that have been documented are verbally provided by the patient.   Subjective:   [name] is a 68 y.o. who presents for a Medicare Wellness preventive visit.  As a reminder, Annual Wellness Visits don't include a physical exam, and some assessments may be limited, especially if this visit is performed virtually. We may recommend an in-person follow-up visit with your provider if needed.  Visit Complete: Virtual I connected with  [name] on 06/19/24 by a audio enabled telemedicine application and verified that I am speaking with the correct person using two identifiers.  Patient Location: Home  Provider Location: Home Office  I discussed the limitations of evaluation and management by telemedicine. The patient expressed understanding and agreed to proceed.  Vital Signs: Because this visit was a virtual/telehealth visit, some criteria may be missing or patient reported. Any vitals not documented were not able to be obtained and vitals that have been documented are patient reported.  VideoDeclined- This patient declined Librarian, academic. Therefore the visit was completed with audio only.  Persons Participating in Visit: Patient.  AWV Questionnaire: No: Patient Medicare AWV questionnaire was not completed prior to this visit.  Cardiac Risk Factors include: advanced age (>33men, >52 women);diabetes mellitus;dyslipidemia;family history of premature cardiovascular  disease;hypertension;sedentary lifestyle     Objective:    Today's Vitals   06/19/24 0953 06/19/24 0955  Weight: 160 lb (72.6 kg)   Height: 5' 4 (1.626 m)   PainSc: 5  5   PainLoc: Hand    Body mass index is 27.46 kg/m.     06/19/2024    9:59 AM 03/22/2024    8:27 AM 09/22/2023    8:37 AM 08/26/2023    8:44 AM 08/25/2023    8:37 AM 06/30/2023    3:15 PM 06/15/2023    4:54 PM  Advanced Directives  Does Patient Have a Medical Advance Directive? No No No No No No No  Would patient like information on creating a medical advance directive? No - Patient declined No - Patient declined No - Patient declined No - Patient declined Yes (MAU/Ambulatory/Procedural Areas - Information given) Yes (MAU/Ambulatory/Procedural Areas - Information given) Yes (MAU/Ambulatory/Procedural Areas - Information given)    Current Medications (verified) Outpatient Encounter Medications as of 06/19/2024  Medication Sig   ascorbic acid (VITAMIN C ) 250 MG tablet Take 250 mg by mouth daily.   aspirin 81 MG chewable tablet Chew 81 mg by mouth daily.   atorvastatin  (LIPITOR) 40 MG tablet TAKE ONE TABLET DAILY AT 6pm   Blood Glucose Monitoring Suppl (TRUE METRIX AIR GLUCOSE METER) w/Device KIT 1 Device by Does not apply route daily.   calcium  carbonate (OS-CAL) 600 MG TABS tablet Take 1 tablet (600 mg total) by mouth 2 (two) times daily with a meal. (Patient taking differently: Take 600 mg by mouth daily with breakfast.)   cetirizine  (ZYRTEC ) 10 MG tablet Take 1 tablet (10 mg total) by mouth daily as needed for allergies.   diclofenac  Sodium (VOLTAREN ) 1 %  GEL Apply 4 g topically 4 (four) times daily.   docusate sodium  (COLACE) 100 MG capsule TAKE ONE CAPSULE BY MOUTH TWICE DAILY   Ferrous Sulfate  (IRON  SUPPLEMENT) 300 MG/6.8ML SOLN Take 5 mLs by mouth daily.   fluticasone  (FLONASE ) 50 MCG/ACT nasal spray Place 2 sprays into both nostrils daily.   furosemide  (LASIX ) 40 MG tablet TAKE ONE TABLET EVERY DAY    gabapentin  (NEURONTIN ) 100 MG capsule Take 1 capsule (100 mg total) by mouth 3 (three) times daily as needed.   glucose blood (TRUE METRIX BLOOD GLUCOSE TEST) test strip Test once a day   hydrochlorothiazide  (MICROZIDE ) 12.5 MG capsule Take 1 capsule (12.5 mg total) by mouth daily.   Lancets 28G Thin MISC 1 Box by Does not apply route daily.   Multiple Vitamin (MULTIVITAMIN) tablet Take 1 tablet by mouth daily.   olmesartan  (BENICAR ) 20 MG tablet TAKE ONE TABLET BY MOUTH AT BEDTIME   omeprazole  (PRILOSEC) 40 MG capsule Take 1 capsule (40 mg total) by mouth daily.   OZEMPIC , 2 MG/DOSE, 8 MG/3ML SOPN Inject 2 mg into the skin once a week. Monday   polyethylene glycol powder (GLYCOLAX /MIRALAX ) 17 GM/SCOOP powder Take 17 g by mouth daily as needed for mild constipation.   sucralfate  (CARAFATE ) 1 g tablet Take 1 tablet (1 g total) by mouth 4 (four) times daily -  with meals and at bedtime.   vitamin B-12 (CYANOCOBALAMIN ) 1000 MCG tablet Take 1,000 mcg by mouth daily.   vitamin E 180 MG (400 UNITS) capsule Take 400 Units by mouth daily.   Vitamins A & D 5000-400 units CAPS Take 5,000 Units by mouth daily.   zolpidem  (AMBIEN ) 5 MG tablet TAKE ONE TABLET BY MOUTH AT BEDTIME AS NEEDED FOR SLEEP   No facility-administered encounter medications on file as of 06/19/2024.    Allergies (verified) Aspirin, Pantoprazole  sodium, Nsaids, and Tolmetin   History: Past Medical History:  Diagnosis Date   AC (acromioclavicular) joint arthritis 12/23/2017   Left side   Acute renal failure (HCC) 09/24/2016   Allergic rhinitis 05/27/2010   Qualifier: Diagnosis of  By: Sharlet MD, James     Allergy    Anemia    Anterior epistaxis 02/27/2021   Arthritis    knees   Asthma, cough variant 03/10/2012   Back pain 10/04/2012   Baker's cyst of knee, right 10/17/2019   Baker's cyst of knee, right 10/17/2019   Biliary sludge 04/08/2014   Taking ursodiol  chronically     Blood in stool 10/07/2014   Blood transfusion  without reported diagnosis    BMI 40.0-44.9, adult (HCC) 05/22/2021   Bunion 03/28/2014   Cellulitis of leg, left 09/14/2016   Cervical pain (neck) 03/26/2016   Chest pain 05/12/2015   Constipation 10/19/2013   Chronic in the setting of known diverticulosis and history of bleeds.     Contusion of left knee 12/03/2022   Contusion of right knee 12/03/2022   Cramping of hands 02/27/2016   CYST, KIDNEY, ACQUIRED 08/15/2007   Qualifier: Diagnosis of  By: VALMA MD, [facility]     DDD (degenerative disc disease), lumbar 04/14/2016   Managed by [facility] [id number]   DE QUERVAIN'S TENOSYNOVITIS 11/30/2010   Qualifier: Diagnosis of  By: Bari MD, Theodoro     Degenerative arthritis of right shoulder region 08/2013   Degloving injury of lower extremity 11/12/2016   Diabetes mellitus (HCC) 05/05/2020   A1c 2010 = 6.5%.  No A1c greater than 6.5% since.    Diastasis  recti 12/22/2023   Diverticulosis    Diverticulosis of colon with hemorrhage    Hx  Of recurrent Diverticular bleeding - several prior admissions    Diverticulosis of colon with hemorrhage,  s/p colectomy ileorectal anastamosis    Hx  Of recurrent Diverticular bleeding - 4 to 5  prior admissions for GI bleeding 2016 and before. Seen inhouse by [facility] in 2016   Diverticulosis of colon without hemorrhage 11/18/2014   Noted on colonoscopy 11/2014    Dry eye syndrome of bilateral lacrimal glands 12/23/2021   Dry eye syndrome of bilateral lacrimal glands 12/23/2021   Epistaxis 02/27/2021   Esophageal dysmotility 02/14/2017   Esophageal Manometry 02/11/17   External hemorrhoids without complication 12/28/2012   Family history of colon cancer in father diagnosed in 89s 06/21/2019   Gastrojejunal ulcer with hemorrhage    GERD (gastroesophageal reflux disease)    H/O total knee replacement, bilateral 12/18/2022   High cholesterol    History of GI bleed    History of iron  deficiency anemia 12/29/2006       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 Seen inhouse by [facility] in 2016   Hx of gastrointestinal hemorrhage 05/18/2019   Extending back 2001, 2007 bleeds. 05/2013 colonoscopy.  Dr. Debrah.  For hematochezia, first-degree relative with colon cancer, personal history adenomatous colon polyps.  Sessile, sigmoid polyp removed; path consistent with inflammatory polyp..  Dense pandiverticulosis.  No fresh or old blood encountered.  Presumed to have had diverticular bleed. 11/2014 colonoscopy For hematochezia.  Pandiverticul   Hyperlipidemia associated with type 2 diabetes mellitus (HCC) 02/13/2014   Hypertension    under control with meds., has been on med. > 20 yr.   Hypertension associated with diabetes (HCC) 01/05/2012   Hypokalemia 03/18/2015   3.3 on 03/17/15   Hypokalemia due to loop diuretic therapy 03/18/2015   3.3 on 03/17/15   Hypopigmented skin lesion 02/23/2011   Ileus following gastrointestinal surgery (HCC) 12/12/2015   Insomnia 08/29/2012   Intractable vomiting 09/24/2016   Iridocyclitis, noninfectious, left eye 08/26/2020   Diagnosis: optometrist angela Winton OD at [facility]. Referred back to Dr Lavonia (MD)   Left hand weakness 08/26/2023   Leg cramping 04/22/2014   Lesion of right lower eyelid 02/27/2021   Lipoma of lower extremity 03/07/2013   Overview:  RIGHT THIGH   Low back pain 03/26/2016   Lower GI bleed    Lumbar back pain 03/26/2016   Medial meniscus tear 1997   Right Knee   Mini stroke    Morbid obesity (HCC) 12/29/2006   Multiple open wounds of lower leg 11/12/2016   Muscle pain 05/26/2015   Nerve palsy, acute, left upper extremity 01/29/2022    Associated with acute, traumatic left shoulder dislocation and interventional reduction   Nocturnal leg cramps 06/11/2022   Obesity (BMI 30.0-34.9) 06/11/2022   Onychomycosis of multiple toenails with type 2 diabetes mellitus and peripheral angiopathy (HCC) 12/18/2021   Orthostatic dizziness 08/26/2010   Qualifier: Diagnosis of  By: Prentiss ROSALEA Frieze     Osteoarthritis of both knees 03/12/2016   S/P bilateral knee replacment 2000  Osteoarthritis, bilateral hands/fingers 04/02/2021   Peripheral neuropathy 05/20/2017   Prediabetes 05/05/2020   Premature supraventricular beats 12/25/2015   EKG 12/25/15    Rash and nonspecific skin eruption 04/01/2017   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) 05/18/2019   CT knee 04/2019 :RIGHT knee prosthesis with moderate knee joint effusion   Rotator cuff rupture, complete 08/2013   right   Shoulder dislocation, Left, acute 01/29/2022   ED reduction of acute traumatic left shoulder dislocation with nerve palsy   Shoulder dislocation, Left, acute 01/29/2022   ED reduction of acute traumatic left shoulder dislocation with nerve palsy   Shoulder impingement 08/2013   right   Skin lesion of right lower extremity 05/27/2016   Small bowel obstruction (HCC) 12/12/2015   Snoring 08/24/2018   Stroke (HCC) 2006   Remote left lacunar infarct noted on CT head 2006, 2010    Subacromial bursitis 05/21/2013   Superficial thrombophlebitis 01/29/2016   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   See assessment/plan from 03/22/2019 office visit with colposcopic evaluation by Dr Camie Males [facility]) in [facility]  suspected   Tingling 11/17/2017   Type 2 diabetes mellitus with other specified complication, without long-term current use of insulin (HCC)    Ulcer    Ulcer at site of surgical anastomosis following bypass of stomach 05/18/2019   Vaginal bleeding 05/17/2019   See 03/12/2019 Telephone call note and Office visit 03/23/2019 with Camie Mulch MD in [facility]  clinic   Venous insufficiency of both lower extremities, Right > Left leg 10/12/2019   Venous stasis ulcer of left ankle with fat layer exposed with varicose veins (HCC) 10/20/2016   Wears dentures    upper   Wears partial dentures    lower   Wound infection 12/18/2022   Past Surgical History:  Procedure Laterality Date   ABDOMINAL HYSTERECTOMY  ?1987   partial   APPLICATION OF WOUND VAC Right 12/22/2022   Procedure: APPLICATION OF WOUND VAC;  Surgeon: Lowery Estefana RAMAN, DO;  Location: [facility] OR;  Service: Plastics;  Laterality: Right;   APPLICATION OF WOUND VAC Right 03/16/2023   Procedure: APPLICATION OF WOUND VAC;  Surgeon: Lowery Estefana RAMAN, DO;  Location: [facility];  Service: Plastics;  Laterality: Right;   COLONOSCOPY  05/02/2000; 05/08/2013, 11/08/14   COSMETIC SURGERY  02/22/2014   skin removal surgery from lower abdomen    ENDOVENOUS ABLATION SAPHENOUS VEIN W/ LASER Right 08/12/2016   endovenous laser ablation right greater saphenous vein by Krystal Doing MD    ENDOVENOUS ABLATION SAPHENOUS VEIN W/ LASER Left 10/21/2016   EVLA L GSV by Krystal Doing MD   ESOPHAGOGASTRODUODENOSCOPY N/A 05/17/2019   Procedure: ESOPHAGOGASTRODUODENOSCOPY (EGD);  Surgeon: Avram Lupita BRAVO, MD;  Location: [facility] ENDOSCOPY;  Service: Endoscopy;  Laterality: N/A;   HEMATOMA EVACUATION Right 12/22/2022   Procedure: EXCISION OF right knee WOUND AND  hematoma evacuation;  Surgeon: Lowery Estefana RAMAN, DO;  Location: [facility] OR;  Service: Plastics;  Laterality: Right;   HEMOSTASIS CLIP PLACEMENT  05/17/2019   Procedure: HEMOSTASIS CLIP PLACEMENT;  Surgeon: Avram Lupita BRAVO, MD;  Location: [facility] ENDOSCOPY;  Service: Endoscopy;;   METATARSAL OSTEOTOMY Right 1995   Right Bunion Repair   ROTATOR CUFF REPAIR Left 04/27/2018   ROUX-EN-Y GASTRIC BYPASS  05/07/2012   SCLEROTHERAPY  05/17/2019   Procedure: MATIAS;  Surgeon: Avram Lupita BRAVO, MD;  Location: [facility] ENDOSCOPY;  Service: Endoscopy;;   SHOULDER ARTHROSCOPY  WITH ROTATOR CUFF REPAIR AND SUBACROMIAL DECOMPRESSION Right 08/09/2013   Procedure:  RIGHT SHOULDER ARTHROSCOPY WITH SUBACROMIAL DECOMPRESSION, DISTAL CLAVICLE EXCISION AND ROTATOR CUFF REPAIR and release biceps;  Surgeon: Toribio JULIANNA Chancy, MD;  Location: [facility];  Service: Orthopedics;  Laterality: Right;   SKIN SPLIT GRAFT Right 03/16/2023   Procedure: Split-thickness skin graft to right knee wound;  Surgeon: Lowery Estefana RAMAN, DO;  Location: [facility];  Service: Plastics;  Laterality: Right;   SUBTOTAL COLECTOMY  11/24/2015   [facility]: laparoscopic-assisted subtotal colectomy with ileorectal anastomosis.   TOTAL KNEE ARTHROPLASTY Left    TOTAL KNEE ARTHROPLASTY Right    Family History  Problem Relation Age of Onset   Macular degeneration Mother    Diabetes Mother    Hypertension Mother    Cancer Father 54       Died from complications of colon CA   Colon cancer Father        died in his 67s   Macular degeneration Father    Glaucoma Father    Glaucoma Sister    Macular degeneration Sister    Diabetes Sister    Heart disease Sister    Diabetes Sister    Stroke Sister    Glaucoma Brother    Macular degeneration Brother    Colon polyps Brother    Diabetes Brother    Stroke Brother    Colon polyps Brother    Diabetes Brother    Diabetes Brother    Diabetes Brother    Hypertension Son    Esophageal cancer Neg Hx    Rectal cancer Neg Hx    Stomach cancer Neg Hx    Social History   Socioeconomic History   Marital status: Divorced    Spouse name: Not on file   Number of children: 1   Years of education: 12   Highest education level: 12th grade  Occupational History   Occupation: child care    Occupation: childcare  Tobacco Use   Smoking status: Former    Current packs/day: 0.00    Average packs/day: 0.1 packs/day for 6.0 years (0.6 ttl pk-yrs)    Types: Cigarettes    Start date: 59    Quit date: 1976    Years since  quitting: 49.6   Smokeless tobacco: Never  Vaping Use   Vaping status: Never Used  Substance and Sexual Activity   Alcohol use: Yes    Alcohol/week: 0.0 standard drinks of alcohol    Comment: occasional wine   Drug use: No   Sexual activity: Not Currently  Other Topics Concern   Not on file  Social History Narrative   ** Merged History Encounter **       She is divorced and has 1 child She works in a daycare facility Occasional wine former smoker no drug use   Social Drivers of Corporate investment banker Strain: Low Risk  (06/19/2024)   Overall Financial Resource Strain (CARDIA)    Difficulty of Paying Living Expenses: Not hard at all  Food Insecurity: No Food Insecurity (06/19/2024)   Hunger Vital Sign    Worried About Running Out of Food in the Last Year: Never true    Ran Out of Food in the Last Year: Never true  Transportation Needs: No Transportation Needs (06/19/2024)   PRAPARE - Administrator, Civil Service (Medical): No    Lack of Transportation (Non-Medical): No  Physical Activity: Sufficiently Active (06/19/2024)   Exercise Vital Sign    Days of Exercise per Week: 3 days  Minutes of Exercise per Session: 60 min  Stress: No Stress Concern Present (06/19/2024)   [employer] of Occupational Health - Occupational Stress Questionnaire    Feeling of Stress: Not at all  Social Connections: Moderately Integrated (06/19/2024)   Social Connection and Isolation Panel    Frequency of Communication with Friends and Family: More than three times a week    Frequency of Social Gatherings with Friends and Family: Three times a week    Attends Religious Services: More than 4 times per year    Active Member of Clubs or Organizations: Yes    Attends Engineer, structural: More than 4 times per year    Marital Status: Divorced    Tobacco Counseling Counseling given: Not Answered    Clinical Intake:  Pre-visit preparation completed: Yes  Pain :  0-10 Pain Score: 5  Pain Type: Acute pain Pain Location: Hand Pain Orientation: Right Pain Descriptors / Indicators: Numbness, Tingling Pain Relieving Factors: HAND BRACE FOR SUPPORT  Pain Relieving Factors: HAND BRACE FOR SUPPORT  BMI - recorded: 27.46 Nutritional Status: BMI 25 -29 Overweight Nutritional Risks: None Diabetes: Yes CBG done?: No Did pt. bring in CBG monitor from home?: No  Lab Results  Component Value Date   HGBA1C 5.5 03/22/2024   HGBA1C 5.6 09/22/2023   HGBA1C 5.6 06/30/2023     How often do you need to have someone help you when you read instructions, pamphlets, or other written materials from your doctor or pharmacy?: 1 - Never What is the last grade level you completed in school?: HSG  Interpreter Needed?: No  Information entered by :: Roz Fuller, LPN.   Activities of Daily Living     06/19/2024    9:59 AM  In your present state of health, do you have any difficulty performing the following activities:  Hearing? 0  Vision? 0  Difficulty concentrating or making decisions? 0  Comment BSE: READING, GAMES ON PHONE  Walking or climbing stairs? 0  Dressing or bathing? 0  Doing errands, shopping? 0  Preparing Food and eating ? N  Using the Toilet? N  In the past six months, have you accidently leaked urine? N  Do you have problems with loss of bowel control? N  Managing your Medications? N  Managing your Finances? N  Housekeeping or managing your Housekeeping? N    Patient Care Team: McDiarmid, Krystal BIRCH, MD as PCP - General (Family Medicine) Claudene Pacific, MD as Consulting Physician (Cardiology) Wonda Cy BROCKS, RD as Dietitian (Family Medicine) Paul Barrio, OD as Referring Physician (Optometry) Lita Lye, OD as Consulting Physician (Optometry) Britto, Errol, MD (Inactive) as Consulting Physician (Surgery) Viviano Almarie POUR, NP as Nurse Practitioner (Nutrition) Lavonia Lye, MD as Consulting Physician  (Ophthalmology) Vernona Slough, OD (Optometry) McDiarmid, Krystal BIRCH, MD as Attending Physician (Family Medicine)  I have updated your Care Teams any recent Medical Services you may have received from other providers in the past year.     Assessment:   This is a routine wellness examination for [name].  Hearing/Vision screen Hearing Screening - Comments:: Denies hearing difficulties.  Vision Screening - Comments:: Wears reading glasses - up to date with routine eye exams with [facility]    Goals Addressed             This Visit's Progress    06/19/2024: Continue to maintain my health, stay independent and work out two days a week.       Blood Pressure < 140/90  Depression Screen     06/19/2024   10:01 AM 03/22/2024    8:27 AM 01/13/2024    4:22 PM 09/22/2023    8:37 AM 09/06/2023    3:48 PM 08/26/2023    9:01 AM 08/25/2023    8:38 AM  PHQ 2/9 Scores  PHQ - 2 Score 0 0 0 0 0 0 0  PHQ- 9 Score 0 1 0 1 0 1 1    Fall Risk     06/19/2024    9:59 AM 03/22/2024    8:27 AM 01/13/2024    4:22 PM 09/22/2023    8:37 AM 09/06/2023    3:48 PM  Fall Risk   Falls in the past year? 0 0 0 1 0  Number falls in past yr: 0 0 0 0 0  Injury with Fall? 0 0 0 0 0  Risk for fall due to : No Fall Risks      Follow up Falls evaluation completed        MEDICARE RISK AT HOME:  Medicare Risk at Home Any stairs in or around the home?: No If so, are there any without handrails?: No Home free of loose throw rugs in walkways, pet beds, electrical cords, etc?: Yes Adequate lighting in your home to reduce risk of falls?: Yes Life alert?: No Use of a cane, walker or w/c?: No Grab bars in the bathroom?: Yes Shower chair or bench in shower?: Yes Elevated toilet seat or a handicapped toilet?: Yes  TIMED UP AND GO:  Was the test performed?  No  Cognitive Function: Declined/Normal: No cognitive concerns noted by patient or family. Patient alert, oriented, able to answer questions  appropriately and recall recent events. No signs of memory loss or confusion.    06/19/2024   10:01 AM  MMSE - Mini Mental State Exam  Not completed: Unable to complete        06/19/2024   10:09 AM 06/15/2023    4:54 PM 07/10/2021   12:38 PM  6CIT Screen  What Year? 0 points 0 points 0 points  What month? 0 points 0 points 0 points  What time? 0 points 0 points 0 points  Count back from 20 0 points 0 points 0 points  Months in reverse 0 points 0 points 0 points  Repeat phrase 0 points 0 points 0 points  Total Score 0 points 0 points 0 points    Immunizations Immunization History  Administered Date(s) Administered   Fluad Quad(high Dose 65+) 07/09/2021, 08/26/2022   Fluad Trivalent(High Dose 65+) 08/25/2023   Hepatitis B 09/07/2006, 11/18/2006, 03/22/2007   Influenza Split 07/16/2011, 07/18/2012   Influenza Whole 08/15/2007, 08/06/2008, 08/18/2009, 08/06/2010   Influenza,inj,Quad PF,6+ Mos 10/19/2013, 07/25/2014, 01/25/2015, 07/17/2015, 08/05/2016, 09/25/2016, 07/01/2017, 11/06/2018, 07/05/2019, 09/02/2020   PFIZER Comirnaty(Gray Top)Covid-19 Tri-Sucrose Vaccine 04/02/2021   PFIZER(Purple Top)SARS-COV-2 Vaccination 12/30/2019, 01/29/2020, 08/16/2020   PNEUMOCOCCAL CONJUGATE-20 07/09/2021   PPD Test 07/18/2012, 04/23/2015, 06/17/2017   Pfizer(Comirnaty)Fall Seasonal Vaccine 12 years and older 01/24/2023, 08/25/2023   Pneumococcal Polysaccharide-23 09/07/2006, 12/27/2012, 01/25/2015, 12/19/2015   Td 01/31/2004   Tdap 07/26/2015   Zoster Recombinant(Shingrix) 12/15/2016, 04/03/2022   Zoster, Live 12/15/2016   2 Screening Tests Health Maintenance  Topic Date Due   OPHTHALMOLOGY EXAM  01/14/2023   COVID-19 Vaccine (7 - Pfizer risk 2024-25 season) 02/23/2024   Medicare Annual Wellness (AWV)  06/14/2024   INFLUENZA VACCINE  06/01/2024   Diabetic kidney evaluation - Urine ACR  08/24/2024   HEMOGLOBIN A1C  09/22/2024  Colonoscopy  01/20/2025   Diabetic kidney evaluation - eGFR  measurement  03/22/2025   DTaP/Tdap/Td (3 - Td or Tdap) 07/25/2025   MAMMOGRAM  03/07/2026   Pneumococcal Vaccine: 50+ Years  Completed   DEXA SCAN  Completed   Hepatitis C Screening  Completed   Zoster Vaccines- Shingrix  Completed   HPV VACCINES  Aged Out   Meningococcal B Vaccine  Aged Out   Pneumococcal Vaccine  Discontinued   Hepatitis B Vaccines 19-59 Average Risk  Discontinued    Health Maintenance  Health Maintenance Due  Topic Date Due   OPHTHALMOLOGY EXAM  01/14/2023   COVID-19 Vaccine (7 - Pfizer risk 2024-25 season) 02/23/2024   Medicare Annual Wellness (AWV)  06/14/2024   INFLUENZA VACCINE  06/01/2024   Health Maintenance Items Addressed: Yes Patient aware of care gaps.  Patient is due for Covid-19 and Flu vaccine in Fall 2025.  Additional Screening:  Vision Screening: Recommended annual ophthalmology exams for early detection of glaucoma and other disorders of the eye. Would you like a referral to an eye doctor? No  - Request records from Dr. Vernona  Dental Screening: Recommended annual dental exams for proper oral hygiene  Community Resource Referral / Chronic Care Management: CRR required this visit?  No   CCM required this visit?  No   Plan:    I have personally reviewed and noted the following in the patient's chart:   Medical and social history Use of alcohol, tobacco or illicit drugs  Current medications and supplements including opioid prescriptions. Patient is not currently taking opioid prescriptions. Functional ability and status Nutritional status Physical activity Advanced directives List of other physicians Hospitalizations, surgeries, and ER visits in previous 12 months Vitals Screenings to include cognitive, depression, and falls Referrals and appointments  In addition, I have reviewed and discussed with patient certain preventive protocols, quality metrics, and best practice recommendations. A written personalized care plan for  preventive services as well as general preventive health recommendations were provided to patient.   Roz LOISE Fuller, LPN   1/80/7974   After Visit Summary: (MyChart) Due to this being a telephonic visit, the after visit summary with patients personalized plan was offered to patient via MyChart   Notes: Patient aware of care gaps.  Patient is due for Covid-19 and Flu vaccine in Fall 2025. Request records from Dr. Vernona for last eye exam.

## 2024-06-19 NOTE — Patient Instructions (Signed)
 [name] , Thank you for taking time out of your busy schedule to complete your Annual Wellness Visit with me. I enjoyed our conversation and look forward to speaking with you again next year. I, as well as your care team,  appreciate your ongoing commitment to your health goals. Please review the following plan we discussed and let me know if I can assist you in the future. Your Game plan/ To Do List    Referrals: If you haven't heard from the office you've been referred to, please reach out to them at the phone provided.   Follow up Visits: We will see or speak with you next year for your Next Medicare AWV with our clinical staff Have you seen your provider in the last 6 months (3 months if uncontrolled diabetes)? Yes  Clinician Recommendations:  Aim for 30 minutes of exercise or brisk walking, 6-8 glasses of water, and 5 servings of fruits and vegetables each day.       This is a list of the screenings recommended for you:  Health Maintenance  Topic Date Due   Eye exam for diabetics  01/14/2023   COVID-19 Vaccine (7 - Pfizer risk 2024-25 season) 02/23/2024   Flu Shot  06/01/2024   Yearly kidney health urinalysis for diabetes  08/24/2024   Hemoglobin A1C  09/22/2024   Colon Cancer Screening  01/20/2025   Yearly kidney function blood test for diabetes  03/22/2025   Medicare Annual Wellness Visit  06/19/2025   DTaP/Tdap/Td vaccine (3 - Td or Tdap) 07/25/2025   Mammogram  03/07/2026   Pneumococcal Vaccine for age over 39  Completed   DEXA scan (bone density measurement)  Completed   Hepatitis C Screening  Completed   Zoster (Shingles) Vaccine  Completed   HPV Vaccine  Aged Out   Meningitis B Vaccine  Aged Out   Pneumococcal Vaccine  Discontinued   Hepatitis B Vaccine  Discontinued    Advanced directives: (Declined) Advance directive discussed with you today. Even though you declined this today, please call our office should you change your mind, and we can give you the proper  paperwork for you to fill out. Advance Care Planning is important because it:  [x]  Makes sure you receive the medical care that is consistent with your values, goals, and preferences  [x]  It provides guidance to your family and loved ones and reduces their decisional burden about whether or not they are making the right decisions based on your wishes.  Follow the link provided in your after visit summary or read over the paperwork we have mailed to you to help you started getting your Advance Directives in place. If you need assistance in completing these, please reach out to us  so that we can help you!  See attachments for Preventive Care and Fall Prevention Tips.

## 2024-06-21 ENCOUNTER — Encounter: Payer: Self-pay | Admitting: Family Medicine

## 2024-06-21 ENCOUNTER — Ambulatory Visit (INDEPENDENT_AMBULATORY_CARE_PROVIDER_SITE_OTHER): Admitting: Family Medicine

## 2024-06-21 VITALS — BP 113/78 | HR 77 | Ht 64.0 in | Wt 164.0 lb

## 2024-06-21 DIAGNOSIS — L259 Unspecified contact dermatitis, unspecified cause: Secondary | ICD-10-CM

## 2024-06-21 DIAGNOSIS — R21 Rash and other nonspecific skin eruption: Secondary | ICD-10-CM

## 2024-06-21 DIAGNOSIS — I10 Essential (primary) hypertension: Secondary | ICD-10-CM

## 2024-06-21 DIAGNOSIS — E611 Iron deficiency: Secondary | ICD-10-CM | POA: Diagnosis not present

## 2024-06-21 DIAGNOSIS — Z9884 Bariatric surgery status: Secondary | ICD-10-CM | POA: Diagnosis not present

## 2024-06-21 DIAGNOSIS — E1159 Type 2 diabetes mellitus with other circulatory complications: Secondary | ICD-10-CM

## 2024-06-21 DIAGNOSIS — I152 Hypertension secondary to endocrine disorders: Secondary | ICD-10-CM

## 2024-06-21 DIAGNOSIS — K219 Gastro-esophageal reflux disease without esophagitis: Secondary | ICD-10-CM

## 2024-06-21 MED ORDER — SUCRALFATE 1 G PO TABS: 1.0000 g | ORAL_TABLET | Freq: Three times a day (TID) | ORAL | 3 refills | Status: AC

## 2024-06-21 MED ORDER — IRON SUPPLEMENT 220 (44 FE) MG/5ML PO SOLN: 5.0000 mL | Freq: Every day | ORAL | 99 refills | Status: AC

## 2024-06-21 MED ORDER — HYDROCHLOROTHIAZIDE 12.5 MG PO CAPS: 12.5000 mg | ORAL_CAPSULE | Freq: Every day | ORAL | 3 refills | Status: AC

## 2024-06-21 MED ORDER — TRIAMCINOLONE ACETONIDE 0.1 % EX CREA: 1.0000 | TOPICAL_CREAM | Freq: Two times a day (BID) | CUTANEOUS | 0 refills | Status: DC

## 2024-06-21 NOTE — Telephone Encounter (Signed)
Shipment given to patient.

## 2024-06-21 NOTE — Patient Instructions (Signed)
 Dr Kiko Ripp thinks your itching rash may be an allergic reaction to something.   Recommend applying triamcinolone  cream twice a day to itching skin areas.  Continue for 10 days.  Recommend using a cotton brassiere until rash is gone.   Avoid the Arm and Hammer detergent.    If not improved after on week, let Dr Tashauna Caisse know.

## 2024-06-22 ENCOUNTER — Encounter: Payer: Self-pay | Admitting: Family Medicine

## 2024-06-22 NOTE — Progress Notes (Signed)
 [name] is alone Sources of clinical information for visit is/are patient. Nursing assessment for this office visit was reviewed with the patient for accuracy and revision.   Previous Report(s) Reviewed: none     06/21/2024   10:15 AM  Depression screen PHQ 2/9  Decreased Interest 0  Down, Depressed, Hopeless 0  PHQ - 2 Score 0  Altered sleeping 1  Tired, decreased energy 2  Change in appetite 0  Feeling bad or failure about yourself  0  Trouble concentrating 0  Moving slowly or fidgety/restless 0  Suicidal thoughts 0  PHQ-9 Score 3  Difficult doing work/chores Not difficult at all   [employer] Office Visit from 06/21/2024 in [facility] [place]. [facility] Clinical Support from 06/19/2024 in [facility] [place]. [facility] Office Visit from 03/22/2024 in [facility] - [facility] Dept Of [place]. [facility]  Thoughts that you would be better off dead, or of hurting yourself in some way Not at all Not at all Not at all  PHQ-9 Total Score 3 0 1       06/21/2024   10:15 AM 06/19/2024    9:59 AM 03/22/2024    8:27 AM 01/13/2024    4:22 PM 09/22/2023    8:37 AM  Fall Risk   Falls in the past year? 0 0 0 0 1  Number falls in past yr: 0 0 0 0 0  Injury with Fall? 0 0 0 0 0  Risk for fall due to :  No Fall Risks     Follow up  Falls evaluation completed          06/21/2024   10:15 AM 06/19/2024   10:01 AM 03/22/2024    8:27 AM  PHQ9 SCORE ONLY  PHQ-9 Total Score 3 0  1      Data saved with a previous flowsheet row definition    There are no preventive care reminders to display for this patient.  Health Maintenance Due  Topic Date Due   OPHTHALMOLOGY EXAM  01/14/2023   COVID-19 Vaccine (7 - Pfizer risk 2024-25 season) 02/23/2024   INFLUENZA VACCINE  06/01/2024      History/P.E. limitations: none  There are no preventive care reminders to display for this patient.   Diabetes Health Maintenance Due  Topic Date Due   OPHTHALMOLOGY EXAM  01/14/2023   HEMOGLOBIN A1C  09/22/2024    Health Maintenance Due  Topic Date Due   OPHTHALMOLOGY EXAM  01/14/2023   COVID-19 Vaccine (7 - Pfizer risk 2024-25 season) 02/23/2024   INFLUENZA VACCINE  06/01/2024     Chief Complaint  Patient presents with   Rash     Discussed the use of AI scribe software for clinical note transcription with the patient, who gave verbal consent to proceed.  History of Present Illness      SDOH Screenings   Food Insecurity: No Food Insecurity (06/19/2024)  Housing: Low Risk  (06/19/2024)  Transportation Needs: No Transportation Needs (06/19/2024)  Utilities: Not At Risk (06/19/2024)  Alcohol Screen: Low Risk  (06/19/2024)  Depression (PHQ2-9): Low Risk  (06/21/2024)  Financial Resource Strain: Low Risk  (06/19/2024)  Physical Activity: Sufficiently Active (06/19/2024)  Social Connections: Moderately Integrated (06/19/2024)  Stress: No Stress Concern Present (06/19/2024)  Tobacco Use: Medium Risk (06/21/2024)  Health Literacy: Adequate Health Literacy (06/19/2024)   --------------------------------------------------------------------------------------------------------------------------------------------- Visit  Problem List with Assessment and Plan   Assessment and Plan Assessment & Plan      No problem-specific Assessment & Plan notes found for this encounter.

## 2024-06-22 NOTE — Assessment & Plan Note (Signed)
 Chief Complaint:  Rash  HPI:  Location: beneath breasts          Is it anywhere else?: itching on chest around bra line both sides Duration: noticed last couple days        Severity: annoying Modifying factors: nothing tried How did it start?: itching brought rash to her notice Context:  New detergent ([employer] Hammer Oxiclean)  Associated factors:      Itch: yes      Painful: no      Oral lesions: no      Impairing social/occupational/personal activites: cannot return to daycare until gets note from doctor  Skin or Respiratory allergies: History of topical steroid responsive itching rash on thighs 2016.    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   Lesion Color (Skin/Red/Tienda/Brown-Black/Yellow) brown Secondary Lesion (Crust/Scale/Fissure/Erosion/Ulceration/Excoriation/Atrophy/Lichenified/Scar- Striae-Keloid) Hyperkeratosis, linear Blushing (Yes/No): no  Topology (Flat-topped/Domed/Filiform/Pedunculated/Smooth/Verrucous/Umbilicated) Flat, smooth surface, suppleness of skin of rash is not present  No satellite lesions No erosions  There is hyperpigmentation of hair follicles of sternal chest. ?ketosis pillaris  A/P Given pruritus rash following brassier lower edge elastic strap around anterior to posterior torso, it may be a contact dermatitis, possible precipitated by new detergent will treat as a hypersensitivity response. Start triamcinolone  0.1% cream twice a day for 10 days. Avoid use of new detergent Use cotton brassieres until rash healed.

## 2024-07-05 ENCOUNTER — Encounter: Payer: Self-pay | Admitting: Family Medicine

## 2024-07-05 ENCOUNTER — Ambulatory Visit: Admitting: Family Medicine

## 2024-07-05 VITALS — BP 131/86 | HR 84 | Ht 64.0 in | Wt 157.5 lb

## 2024-07-05 DIAGNOSIS — I10 Essential (primary) hypertension: Secondary | ICD-10-CM | POA: Diagnosis not present

## 2024-07-05 DIAGNOSIS — I872 Venous insufficiency (chronic) (peripheral): Secondary | ICD-10-CM

## 2024-07-05 DIAGNOSIS — E1159 Type 2 diabetes mellitus with other circulatory complications: Secondary | ICD-10-CM | POA: Diagnosis not present

## 2024-07-05 DIAGNOSIS — Z79899 Other long term (current) drug therapy: Secondary | ICD-10-CM

## 2024-07-05 DIAGNOSIS — E119 Type 2 diabetes mellitus without complications: Secondary | ICD-10-CM

## 2024-07-05 DIAGNOSIS — R21 Rash and other nonspecific skin eruption: Secondary | ICD-10-CM

## 2024-07-05 DIAGNOSIS — G4700 Insomnia, unspecified: Secondary | ICD-10-CM | POA: Diagnosis not present

## 2024-07-05 DIAGNOSIS — Z23 Encounter for immunization: Secondary | ICD-10-CM | POA: Diagnosis not present

## 2024-07-05 DIAGNOSIS — I152 Hypertension secondary to endocrine disorders: Secondary | ICD-10-CM | POA: Diagnosis not present

## 2024-07-05 LAB — POCT GLYCOSYLATED HEMOGLOBIN (HGB A1C): HbA1c, POC (controlled diabetic range): 5.6 % (ref 0.0–7.0)

## 2024-07-05 MED ORDER — ZOLPIDEM TARTRATE 5 MG PO TABS: 5.0000 mg | ORAL_TABLET | Freq: Every evening | ORAL | 5 refills | Status: AC | PRN

## 2024-07-05 MED ORDER — OLMESARTAN MEDOXOMIL 20 MG PO TABS: 20.0000 mg | ORAL_TABLET | Freq: Every day | ORAL | 3 refills | Status: AC

## 2024-07-05 NOTE — Progress Notes (Unsigned)
 [name] is {Pc accompanied by:5710} Sources of clinical information for visit is/are {Information source:60032}. Nursing assessment for this office visit was reviewed with the patient for accuracy and revision.   Previous Report(s) Reviewed: {Outside review:15817}     07/05/2024    9:09 AM  Depression screen PHQ 2/9  Decreased Interest 0  Down, Depressed, Hopeless 0  PHQ - 2 Score 0  Altered sleeping 1  Tired, decreased energy 1  Change in appetite 0  Feeling bad or failure about yourself  0  Trouble concentrating 0  Moving slowly or fidgety/restless 0  Suicidal thoughts 0  PHQ-9 Score 2  Difficult doing work/chores Somewhat difficult   Flowsheet Row Office Visit from 07/05/2024 in [facility] Dept Of [place]. [facility] Office Visit from 06/21/2024 in [facility] Dept Of [place]. [facility] Clinical Support from 06/19/2024 in [facility] - [facility] Of [place]. [facility]  Thoughts that you would be better off dead, or of hurting yourself in some way Not at all Not at all Not at all  PHQ-9 Total Score 2 3 0       07/05/2024    9:09 AM 06/21/2024   10:15 AM 06/19/2024    9:59 AM 03/22/2024    8:27 AM 01/13/2024    4:22 PM  Fall Risk   Falls in the past year? 0 0 0 0 0  Number falls in past yr: 0 0 0 0 0  Injury with Fall? 0 0 0 0 0  Risk for fall due to :   No Fall Risks    Follow up   Falls evaluation completed         07/05/2024    9:09 AM 06/21/2024   10:15 AM 06/19/2024   10:01 AM  PHQ9 SCORE ONLY  PHQ-9 Total Score 2 3 0      Data saved with a previous flowsheet row definition    There are no preventive care reminders to display for this patient.  Health Maintenance Due  Topic Date Due   INFLUENZA VACCINE  06/01/2024   COVID-19 Vaccine (7 - Pfizer risk 2024-25 season) 07/02/2024      History/P.E. limitations: {exam; limitations ed:60112}  There are no preventive care  reminders to display for this patient.  Diabetes Health Maintenance Due  Topic Date Due   OPHTHALMOLOGY EXAM  08/24/2024   HEMOGLOBIN A1C  09/22/2024    Health Maintenance Due  Topic Date Due   INFLUENZA VACCINE  06/01/2024   COVID-19 Vaccine (7 - Pfizer risk 2024-25 season) 07/02/2024     Chief Complaint  Patient presents with   Medical Management of Chronic Issues     Discussed the use of AI scribe software for clinical note transcription with the patient, who gave verbal consent to proceed.  History of Present Illness      SDOH Screenings   Food Insecurity: No Food Insecurity (06/19/2024)  Housing: Low Risk  (06/19/2024)  Transportation Needs: No Transportation Needs (06/19/2024)  Utilities: Not At Risk (06/19/2024)  Alcohol Screen: Low Risk  (06/19/2024)  Depression (PHQ2-9): Low Risk  (07/05/2024)  Financial Resource Strain: Low Risk  (06/19/2024)  Physical Activity: Sufficiently Active (06/19/2024)  Social Connections: Moderately Integrated (06/19/2024)  Stress: No Stress Concern Present (06/19/2024)  Tobacco Use: Medium Risk (07/05/2024)  Health Literacy: Adequate Health Literacy (06/19/2024)   --------------------------------------------------------------------------------------------------------------------------------------------- Visit Problem List with Assessment  and Plan   Assessment and Plan Assessment & Plan      No problem-specific Assessment & Plan notes found for this encounter.

## 2024-07-05 NOTE — Patient Instructions (Addendum)
 Try Leretha Claw as a moisturizer on your chest, under your breasts and on your arms twice a day to keep the moisture in your skin.   Use the triamcinolone  cream on the itchy skin patches once a day.  Rub in well into skin, then rub in the [facility] over the triamcinolone .   We are checking your potassium and magnesium .   Dr Bradrick Kamau will let you know it they are abnormal.  If they are normal, he will send your a message through your MyChart.   Take Vitamin K3 (menaquinone) as directed on the bottle.  You can purchase vitamin K3 from the [facility] store. This is to help decrease leg cramps at night.  It will not stop them entirely, but should decrease how often and how strong the cramps are.

## 2024-07-06 ENCOUNTER — Encounter: Payer: Self-pay | Admitting: Family Medicine

## 2024-07-06 LAB — BASIC METABOLIC PANEL WITH GFR
BUN/Creatinine Ratio: 21 (ref 12–28)
BUN: 22 mg/dL (ref 8–27)
CO2: 21 mmol/L (ref 20–29)
Calcium: 9.8 mg/dL (ref 8.7–10.3)
Chloride: 104 mmol/L (ref 96–106)
Creatinine, Ser: 1.07 mg/dL — ABNORMAL HIGH (ref 0.57–1.00)
Glucose: 47 mg/dL — ABNORMAL LOW (ref 70–99)
Potassium: 3.5 mmol/L (ref 3.5–5.2)
Sodium: 145 mmol/L — ABNORMAL HIGH (ref 134–144)
eGFR: 57 mL/min/1.73 — ABNORMAL LOW (ref >59)

## 2024-07-06 NOTE — Assessment & Plan Note (Signed)
 Established problem. Adequate blood pressure control.  No evidence of new end organ damage.  Tolerating medication without significant adverse effects.  Plan to continue current blood pressure medication regiment.

## 2024-07-06 NOTE — Assessment & Plan Note (Signed)
 Established problem that has improved and has meet goal of improved Using triamcinolone  prn now for itching. Recommend Shea butter to protect areas of skin.SABRA  PRN trimacinolone cream if itching rahs redevelops.

## 2024-07-06 NOTE — Assessment & Plan Note (Signed)
 Established problem Stable.  Continue Ambien  to 2.5 mg at bedtime prn

## 2024-07-06 NOTE — Assessment & Plan Note (Signed)
Established problem. Adequate glycemic control.  Pt is tolerating the current medication regiment. Continue current treatment plan.

## 2024-07-09 ENCOUNTER — Ambulatory Visit: Payer: Self-pay | Admitting: Family Medicine

## 2024-07-18 DIAGNOSIS — M25562 Pain in left knee: Secondary | ICD-10-CM | POA: Diagnosis not present

## 2024-07-18 DIAGNOSIS — M25561 Pain in right knee: Secondary | ICD-10-CM | POA: Diagnosis not present

## 2024-07-25 ENCOUNTER — Encounter (HOSPITAL_BASED_OUTPATIENT_CLINIC_OR_DEPARTMENT_OTHER): Admitting: General Surgery

## 2024-07-27 ENCOUNTER — Other Ambulatory Visit: Payer: Self-pay | Admitting: Family Medicine

## 2024-07-27 DIAGNOSIS — I872 Venous insufficiency (chronic) (peripheral): Secondary | ICD-10-CM

## 2024-07-27 DIAGNOSIS — E1159 Type 2 diabetes mellitus with other circulatory complications: Secondary | ICD-10-CM

## 2024-08-02 NOTE — Telephone Encounter (Signed)
 A user error has taken place: encounter opened in error, closed for administrative reasons.

## 2024-08-03 NOTE — Progress Notes (Signed)
 [name]                                          MRN: [id number]   08/03/2024   The VBCI Quality Team Specialist reviewed this patient medical record for the purposes of chart review for care gap closure. The following were reviewed: chart review for care gap closure-kidney health evaluation for diabetes:eGFR  and uACR.    VBCI Quality Team

## 2024-08-22 ENCOUNTER — Telehealth: Payer: Self-pay

## 2024-08-22 NOTE — Telephone Encounter (Signed)
 In process of completing Novo Nordisk refills for patients OZEMPIC  2MG  medication.  Application in Dr. McDiarmids box awaiting signature.

## 2024-08-28 NOTE — Telephone Encounter (Signed)
Faxed refills to novo nordisk.

## 2024-09-05 ENCOUNTER — Telehealth: Payer: Self-pay

## 2024-09-05 DIAGNOSIS — I152 Hypertension secondary to endocrine disorders: Secondary | ICD-10-CM

## 2024-09-05 DIAGNOSIS — I872 Venous insufficiency (chronic) (peripheral): Secondary | ICD-10-CM

## 2024-09-05 MED ORDER — FUROSEMIDE 40 MG PO TABS: 20.0000 mg | ORAL_TABLET | Freq: Every day | ORAL | 5 refills | Status: AC

## 2024-09-05 NOTE — Telephone Encounter (Signed)
 Patient calls nurse line in regards to lasix  prescription.   She reports she did not realize she was supposed to be taking 1/2 a tablet. She reports she has been taking the whole 40mg  daily.   She reports she is now out of the prescription, however her pharmacy will not allow for a refill due to the 1/2 tab directions, refill too soon.  She is requesting a new prescription to take 40mg  for insurance coverage.   She reports she will take 1/2 per day.   Will forward to PCP.

## 2024-09-05 NOTE — Telephone Encounter (Signed)
 Spoke with patient. Informed of message from provider. Patient understood.Nelson Land, CMA

## 2024-09-05 NOTE — Telephone Encounter (Signed)
 Please let Ms Parmer know her prescription for lasix  was sent to the pharmacy

## 2024-09-11 NOTE — Telephone Encounter (Signed)
 Rec'd 3 boxes of Ozempic  2mg  dose pens.   Novo Nordisk 2026 changes paperwork placed in patients bag.   Medicare Part D patients will no longer be eligible for assistance in 2026.

## 2024-09-11 NOTE — Telephone Encounter (Signed)
 Placed Novo Nordisk 2026 changed in patients Ozempic  shipment pickup.

## 2024-09-12 NOTE — Telephone Encounter (Signed)
 Left message informing patient of shipment being ready for pickup.

## 2024-10-02 DIAGNOSIS — M94261 Chondromalacia, right knee: Secondary | ICD-10-CM | POA: Diagnosis not present

## 2024-10-11 ENCOUNTER — Encounter: Payer: Self-pay | Admitting: Family Medicine

## 2024-10-11 ENCOUNTER — Ambulatory Visit: Admitting: Family Medicine

## 2024-10-18 NOTE — Telephone Encounter (Signed)
 Informed patient of her Novo Nordisk shipment pickup and the 2026 changes. Patient will go over attached paperwork and reach back out to me if she needs any further help.

## 2024-10-30 NOTE — Telephone Encounter (Signed)
 Returned patients call regarding the paperwork you gave her for patient assistance.  Discussed and screened for LIS.   Household size of 1.  Monthly income:  $1540 SSI $149 pension $1689 monthly total = 129% [employer]PL  Should qualify for LIS. Patient says she will call to apply over the phone and will give me a call back once she receives a decision letter in the mail.

## 2024-11-15 ENCOUNTER — Other Ambulatory Visit: Payer: Self-pay

## 2024-11-15 DIAGNOSIS — J301 Allergic rhinitis due to pollen: Secondary | ICD-10-CM

## 2024-11-15 MED ORDER — FLUTICASONE PROPIONATE 50 MCG/ACT NA SUSP: 2.0000 | Freq: Every day | NASAL | 11 refills | Status: AC

## 2024-12-06 ENCOUNTER — Telehealth: Payer: Self-pay

## 2024-12-06 ENCOUNTER — Ambulatory Visit

## 2024-12-06 ENCOUNTER — Ambulatory Visit: Payer: Self-pay

## 2024-12-06 VITALS — BP 131/85 | HR 90 | Wt 157.6 lb

## 2024-12-06 DIAGNOSIS — M25562 Pain in left knee: Secondary | ICD-10-CM

## 2024-12-06 DIAGNOSIS — E119 Type 2 diabetes mellitus without complications: Secondary | ICD-10-CM

## 2024-12-06 DIAGNOSIS — L259 Unspecified contact dermatitis, unspecified cause: Secondary | ICD-10-CM

## 2024-12-06 LAB — POCT GLYCOSYLATED HEMOGLOBIN (HGB A1C): HbA1c, POC (controlled diabetic range): 5.6 % (ref 0.0–7.0)

## 2024-12-06 MED ORDER — LANCETS MISC. MISC: 1.0000 | Freq: Three times a day (TID) | 0 refills | Status: AC

## 2024-12-06 MED ORDER — BLOOD GLUCOSE TEST VI STRP: 1.0000 | ORAL_STRIP | Freq: Three times a day (TID) | 3 refills | Status: AC

## 2024-12-06 MED ORDER — TRIAMCINOLONE ACETONIDE 0.1 % EX CREA: 1.0000 | TOPICAL_CREAM | Freq: Two times a day (BID) | CUTANEOUS | 0 refills | Status: AC

## 2024-12-06 MED ORDER — BLOOD GLUCOSE MONITORING SUPPL DEVI: 1.0000 | Freq: Three times a day (TID) | 0 refills | Status: AC

## 2024-12-06 MED ORDER — OXYCODONE HCL 5 MG PO TABS: 5.0000 mg | ORAL_TABLET | ORAL | 0 refills | Status: AC | PRN

## 2024-12-06 MED ORDER — LANCET DEVICE MISC: 1.0000 | Freq: Three times a day (TID) | 0 refills | Status: AC

## 2024-12-06 NOTE — Assessment & Plan Note (Signed)
 A1c today 5.6, pending urine microalbumin, stable on current regiment no doses were port decreased appetite but is supplementing with protein shakes. Strongly encouraged to continue daily exercise and muscle strengthening Refill diabetes supplies Continue Ozempic 

## 2024-12-06 NOTE — Progress Notes (Signed)
" ° ° °  SUBJECTIVE:   CHIEF COMPLAINT / HPI:   Left leg pain: Started 11/25/2024, the first day it started snowing. Noticed the throbbing, dull pain with swelling over the knee and popliteal fossa 30 minutes after she was shoveling snow on the driveway. Pain has been the same since that time, 8/10 with difficulty moving, leg feels cold to touch, pain is worse at night and when lying flat, improves with movement and voltaren  gel. Has been wrapping it with ace bandage and ice in the evenings. No redness. Has never had this happen in the past. No twisting injury or fall. Denies any catching, popping, or instability. Had a total knee replacement 20 years ago. Saw orthopedist Dr. Evalene Chancy yesterday who took x-rays and reported that everything was normal and prescribed solumedrol dose pack with return Monday. 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 No recent surgeries or prolonged travel.    T2DM: No issues taking medications, appetite remains low but has been drinking protein shakes, denies any symptoms of polyuria, polydypsia, fevers, vomiting, CP, SOB. One episode of nausea with presyncopal symptoms a few weeks ago but none since then.   PERTINENT  PMH / PSH: GERD, HTN, chronic venous insufficiency right worse than left, degenerative disc disease, chondromalacia  Endovenous ablation of saphenous vein, hematoma evacuation of right knee, Roux-en-Y gastric bypass, rotator cuff repair, right shoulder arthroscopy with rotator cuff, bilateral total knee arthroplasty, left knee replacement, history of stroke, insomnia  OBJECTIVE:   BP 131/85   Pulse 90   Wt 157 lb 9.6 oz (71.5 kg)   SpO2 100%   BMI 27.05 kg/m    Cardiac: Regular rate and rhythm. Normal S1/S2. No murmurs, rubs, or gallops appreciated. Lungs: Clear bilaterally to ascultation.  Abdomen: Normoactive bowel sounds. No tenderness to deep or light palpation. No rebound or guarding. Right knee: No edema or  tenderness noted, full range of motion, extension and flexion 5/5 strength Left knee: Moderate medial knee edema, very tender, with notable ecchymosis, exquisite pain with McMurray and anterior drawer with notable laxity, posterior drawer normal, pain with valgus and varus force.  Pain with active extension and flexion of knee, full passive extension and flexion with mild pain Right knee: No bony abnormalities, joint line tenderness, edema, effusion, redness or crepitus. Normal internal rotation, external rotation, flexion and extension. No joint laxity. Extremities: Mild 1+ bilateral pitting edema to proximal one half tibia, nontender Psych: Pleasant and appropriate    ASSESSMENT/PLAN:   Assessment & Plan Medial knee pain, left Acute onset after overexertion from shoveling driveway, primarily concerned about muscle or tendon tear/rupture given persistent edema and new ecchymosis with diminished functional strength and history of previous total knee replacement.  Lower concern for DVT given Wells score of -2. Oxycodone  5 mg to take 1/2 tablet every 4-6 hours, instructed patient not to take Ambien  with the oxycodone .  Patient voiced understanding Strongly urged patient to go to Ortho follow-up next week Controlled type 2 diabetes mellitus without complication, without long-term current use of insulin (HCC) A1c today 5.6, pending urine microalbumin, stable on current regiment no doses were port decreased appetite but is supplementing with protein shakes. Strongly encouraged to continue daily exercise and muscle strengthening Refill diabetes supplies Continue Ozempic  Contact dermatitis, unspecified contact dermatitis type, unspecified trigger Patient requested refill of Kenalog  cream     Fairy Amy, MD [facility] "

## 2024-12-06 NOTE — Patient Instructions (Signed)
 Thank you for visiting the clinic today, it was good to see you!  Please always bring your medication bottles  In today's visit we discussed:  Left knee pain: Given your history of joint replacement, it is very possible that you tore a muscle or ligament of your knee.  Fortunately you have already established care with orthopedic surgery and they will provide the further management of this injury including possible further imaging.  For the meantime I have prescribed 5 mg of oxycodone , please take one half a tablet every 4-6 hours as needed for pain, it is very important that you do not take this medication within 4 hours of you taking the Ambien  for sleep, as they can make you dangerously tired.  Additionally please take Tylenol  1000 mg every 6 hours for at least the next couple of weeks has a baseline medication for the pain.  Continue to rest your knee while further workup is pursued and wrapped the knee tightly with the Ace bandage as you have been doing.  Please follow-up in 2 to 3 months or sooner if needed.  For any questions, please call the office at [phone] or send me a message in MyChart. Have a great day!  -Fairy Amy, MD  [facility] Resident, PGY-1

## 2024-12-06 NOTE — Telephone Encounter (Signed)
 Pt LVM on nurse line requesting returned call regarding left leg swelling and throbbing pain.  Returned call to patient. She reports that she has been noticing swelling and pain in left leg for about one week.   Denies injury. States that pain is worse at night when she is trying to sleep.   Pain is present in calf and up behind her knee.   She denies shortness of breath, difficulty breathing or chest pain.   Scheduled same day appointment this PM with Dr. Lorrane.   ED precautions discussed.   Chiquita JAYSON English, RN

## 2024-12-07 LAB — MICROALBUMIN / CREATININE URINE RATIO
Creatinine, Urine: 91.1 mg/dL
Microalb/Creat Ratio: 22 mg/g{creat} (ref 0–29)
Microalbumin, Urine: 20.4 ug/mL

## 2024-12-13 ENCOUNTER — Ambulatory Visit: Admitting: Family Medicine

## 2025-06-20 ENCOUNTER — Encounter
